# Patient Record
Sex: Male | Born: 1950
Health system: Southern US, Community
[De-identification: ages and names within clinical notes are randomized; demographics above are authoritative.]

## PROBLEM LIST (undated history)

## (undated) DIAGNOSIS — I340 Nonrheumatic mitral (valve) insufficiency: Secondary | ICD-10-CM

## (undated) DIAGNOSIS — I10 Essential (primary) hypertension: Secondary | ICD-10-CM

## (undated) DIAGNOSIS — I5022 Chronic systolic (congestive) heart failure: Secondary | ICD-10-CM

## (undated) DIAGNOSIS — I272 Pulmonary hypertension, unspecified: Secondary | ICD-10-CM

## (undated) DIAGNOSIS — N183 Chronic kidney disease, stage 3 unspecified: Secondary | ICD-10-CM

## (undated) DIAGNOSIS — I513 Intracardiac thrombosis, not elsewhere classified: Secondary | ICD-10-CM

## (undated) DIAGNOSIS — E871 Hypo-osmolality and hyponatremia: Secondary | ICD-10-CM

## (undated) DIAGNOSIS — Z720 Tobacco use: Secondary | ICD-10-CM

## (undated) DIAGNOSIS — K449 Diaphragmatic hernia without obstruction or gangrene: Secondary | ICD-10-CM

## (undated) DIAGNOSIS — F209 Schizophrenia, unspecified: Secondary | ICD-10-CM

## (undated) DIAGNOSIS — E785 Hyperlipidemia, unspecified: Secondary | ICD-10-CM

## (undated) DIAGNOSIS — M1990 Unspecified osteoarthritis, unspecified site: Secondary | ICD-10-CM

## (undated) DIAGNOSIS — I509 Heart failure, unspecified: Secondary | ICD-10-CM

## (undated) DIAGNOSIS — J449 Chronic obstructive pulmonary disease, unspecified: Secondary | ICD-10-CM

## (undated) HISTORY — PX: PENILE PROSTHESIS IMPLANT: SHX240

---

## 2002-03-22 ENCOUNTER — Encounter: Payer: Self-pay | Admitting: Emergency Medicine

## 2002-03-22 ENCOUNTER — Emergency Department (HOSPITAL_COMMUNITY): Admission: EM | Admit: 2002-03-22 | Discharge: 2002-03-22 | Payer: Self-pay | Admitting: Emergency Medicine

## 2003-02-04 ENCOUNTER — Emergency Department (HOSPITAL_COMMUNITY): Admission: EM | Admit: 2003-02-04 | Discharge: 2003-02-04 | Payer: Self-pay | Admitting: Emergency Medicine

## 2003-12-11 ENCOUNTER — Observation Stay (HOSPITAL_COMMUNITY): Admission: RE | Admit: 2003-12-11 | Discharge: 2003-12-12 | Payer: Self-pay | Admitting: Urology

## 2005-07-06 ENCOUNTER — Emergency Department (HOSPITAL_COMMUNITY): Admission: EM | Admit: 2005-07-06 | Discharge: 2005-07-06 | Payer: Self-pay | Admitting: Emergency Medicine

## 2005-08-24 ENCOUNTER — Emergency Department (HOSPITAL_COMMUNITY): Admission: EM | Admit: 2005-08-24 | Discharge: 2005-08-24 | Payer: Self-pay | Admitting: Emergency Medicine

## 2006-01-06 ENCOUNTER — Emergency Department (HOSPITAL_COMMUNITY): Admission: EM | Admit: 2006-01-06 | Discharge: 2006-01-06 | Payer: Self-pay | Admitting: Emergency Medicine

## 2006-09-03 ENCOUNTER — Emergency Department (HOSPITAL_COMMUNITY): Admission: EM | Admit: 2006-09-03 | Discharge: 2006-09-03 | Payer: Self-pay | Admitting: Emergency Medicine

## 2009-12-05 ENCOUNTER — Emergency Department (HOSPITAL_COMMUNITY): Admission: EM | Admit: 2009-12-05 | Discharge: 2009-12-05 | Payer: Self-pay | Admitting: Emergency Medicine

## 2010-10-13 ENCOUNTER — Emergency Department (HOSPITAL_COMMUNITY)
Admission: EM | Admit: 2010-10-13 | Discharge: 2010-10-13 | Disposition: A | Discharge status: Home / Self Care | Payer: Medicare Other | Attending: Emergency Medicine | Admitting: Emergency Medicine

## 2010-10-13 DIAGNOSIS — I10 Essential (primary) hypertension: Secondary | ICD-10-CM | POA: Insufficient documentation

## 2010-10-13 DIAGNOSIS — R109 Unspecified abdominal pain: Secondary | ICD-10-CM | POA: Insufficient documentation

## 2010-10-13 DIAGNOSIS — R112 Nausea with vomiting, unspecified: Secondary | ICD-10-CM | POA: Insufficient documentation

## 2010-10-13 LAB — COMPREHENSIVE METABOLIC PANEL
BUN: 20 mg/dL (ref 6–23)
CO2: 23 mEq/L (ref 19–32)
Chloride: 98 mEq/L (ref 96–112)
Creatinine, Ser: 1.27 mg/dL (ref 0.4–1.5)
GFR calc non Af Amer: 58 mL/min — ABNORMAL LOW (ref >60)
Total Bilirubin: 1.3 mg/dL — ABNORMAL HIGH (ref 0.3–1.2)

## 2010-10-13 LAB — POCT CARDIAC MARKERS: Myoglobin, poc: >500 ng/mL (ref 12–200)

## 2010-11-20 NOTE — Op Note (Signed)
NAME:  Gerald Nelson, Gerald Nelson                          ACCOUNT NO.:  [id number]   MEDICAL RECORD NO.:  [id number]                   PATIENT TYPE:  AMB   LOCATION:  DAY                                  FACILITY:  [facility]   PHYSICIAN:  Lucina Mellow. Terance Hart, M.D.             DATE OF BIRTH:  [birth year]   DATE OF PROCEDURE:  12/11/2003  DATE OF DISCHARGE:                                 OPERATIVE REPORT   PREOPERATIVE DIAGNOSIS:  Failed penile prosthesis.   POSTOPERATIVE DIAGNOSIS:  Failed penile prosthesis.   OPERATION/PROCEDURE:  Removal and replacement of a three-piece penile  prosthesis.   SURGEON:  Lucina Mellow. Terance Hart, M.D.   RESIDENT SURGEON:  Dorie Rank, M.D.   ANESTHESIA:  General endotracheal anesthesia.   COMPLICATIONS:  None.   ESTIMATED BLOOD LOSS:  Less than 100 mL.   INDICATIONS FOR PROCEDURE:  Gerald Nelson is a 60 year old African American  male who was sent to the urology center for evaluation by [name]  secondary to impotency.  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  The patient reports that his prosthesis  worked for some time but has become nonfunctional over the past few years.  Gerald Nelson has requested removal and replacement of his prosthesis and is  for this procedure that he presents today.   DESCRIPTION OF PROCEDURE:  The patient was brought to the operating room and  following induction of general endotracheal anesthesia, was placed in a  supine position and his inguinal region and genitals shaved as per routine.  A 10-minute scrub was performed to thoroughly cleanse the scrotum, penis and  surrounding inguinal region.  The patient was subsequently prepped and  draped in the usual sterile fashion.  A 16-French Foley catheter was placed  to straight drain and plugged.   A midline scrotal incision was subsequently performed.  Dissection  was  carried down through the dartos muscle and the investing tissues of the  corporeal bodies until the surface of the corporeal body had been reached.  A Bovie cautery was subsequently used to open the left corporeal body  longitudinally. The left corporeal cylinder of the old prosthesis was  identified and removed.  Likewise, dissection was carried down to the  surface of the right corporeal body which was subsequently opened  longitudinally with Bovie cautery.  The right corporeal prosthesis  identified and removed.  The scrotal pump was subsequently approached by  dissecting through the surrounding tissue layers and opening the fibrous  capsule around the pump itself.  Once the pump had been removed, attempt was  made to remove the reservoir which was located in the midline just superior  and posterior to the pubic symphysis.  Due to the fact that the entire  reservoir could not be removed without making a second  incision, the  decision was made to simply cut the tubing and leave the old reservoir in  place.  This was subsequently performed and the entire old prosthesis was  removed from the field.  A tunnel was subsequently created from the upper  field up through the subcutaneous tissues and superficial fascia to a point  just superior to the right pubic tubercle.  A space was subsequently created  by making a small opening in the fascia and bluntly creating a space just  posterior to the pubic bone.  A new reservoir was subsequently placed into  this space.   Attention was next focused on the corpora themselves.  After dilating each  corpora up to approximately size 14, measurement was taken which indicated a  length of approximately 19 cm.  An 18 prosthesis was subsequently chosen and  1 cm extenders applied to the posterior end of each cylinder.  Each cylinder  was subsequently placed distally by passing the cylinder through each  corporeal body to the glans penis.  An attempt  was subsequently made to  place the proximal portion of each cylinder once the cylinders had been  placed and the prosthesis was inflated.  However, there is a significant  amount of kinking and buckling of the cylinders.  At this point the 1 cm  extenders were removed and 0.5 cm extenders were applied.  Again, once the  cylinders were in proper position, there was still a small amount of  buckling, although it was much improved.  The cylinders were subsequently  removed and all extenders taken away from the proximal end of the cylinders.  These were subsequently placed into position and on inflation of the  prosthesis, appeared to fit perfectly.  The corpora were subsequently closed  over the new prosthesis using interrupted 2-0 Vicryl suture.  Once the  corporeal closure was complete, the pump was placed into a newly created  pocket in the subcutaneous tissues of the scrotum located in the right  inferior scrotum.  The pump was secured into place by suturing the dartos  muscle over the pump and around the tubing.  The tubing connecting the three  parts of the prosthesis together subsequently joined together and the  prosthesis inflated a final time to assure proper positioning.  The penis  appeared to be in good position and appropriate for function.  The  prosthesis was subsequently deflated and the dartos muscle closed over the  tubing using a running 3-0 chromic suture.  The skin was subsequently closed  with a running 3-0 chromic suture.  Collodion was applied to the suture  line, the penis dressed and the case was ended.  The patient tolerated the  procedure well.  There were no complications.   Please note that Dr. Hessie Diener was present for the entire case and  participated in all aspects of the procedure.     Lynford Citizen, MD                            Lucina Mellow. Terance Hart, M.D.   EG/MEDQ  D:  12/11/2003  T:  12/11/2003  Job:  [id number]

## 2011-07-10 ENCOUNTER — Emergency Department (HOSPITAL_COMMUNITY)
Admission: EM | Admit: 2011-07-10 | Discharge: 2011-07-10 | Disposition: A | Discharge status: Nursing Home (Includes ICF) | Payer: Medicare Other | Source: Home / Self Care | Attending: Emergency Medicine | Admitting: Emergency Medicine

## 2011-07-10 ENCOUNTER — Encounter: Payer: Self-pay | Admitting: *Deleted

## 2011-07-10 ENCOUNTER — Emergency Department (HOSPITAL_COMMUNITY): Payer: Medicare Other

## 2011-07-10 ENCOUNTER — Emergency Department (HOSPITAL_COMMUNITY)
Admission: EM | Admit: 2011-07-10 | Discharge: 2011-07-10 | Disposition: A | Discharge status: Home / Self Care | Payer: Medicare Other | Attending: Emergency Medicine | Admitting: Emergency Medicine

## 2011-07-10 ENCOUNTER — Encounter (HOSPITAL_COMMUNITY): Payer: Self-pay | Admitting: *Deleted

## 2011-07-10 DIAGNOSIS — F17200 Nicotine dependence, unspecified, uncomplicated: Secondary | ICD-10-CM | POA: Insufficient documentation

## 2011-07-10 DIAGNOSIS — R109 Unspecified abdominal pain: Secondary | ICD-10-CM | POA: Insufficient documentation

## 2011-07-10 DIAGNOSIS — I10 Essential (primary) hypertension: Secondary | ICD-10-CM | POA: Insufficient documentation

## 2011-07-10 DIAGNOSIS — R112 Nausea with vomiting, unspecified: Secondary | ICD-10-CM | POA: Insufficient documentation

## 2011-07-10 DIAGNOSIS — K819 Cholecystitis, unspecified: Secondary | ICD-10-CM

## 2011-07-10 DIAGNOSIS — R1011 Right upper quadrant pain: Secondary | ICD-10-CM | POA: Insufficient documentation

## 2011-07-10 HISTORY — DX: Essential (primary) hypertension: I10

## 2011-07-10 LAB — COMPREHENSIVE METABOLIC PANEL
Albumin: 3.2 g/dL — ABNORMAL LOW (ref 3.5–5.2)
BUN: 21 mg/dL (ref 6–23)
Creatinine, Ser: 1.15 mg/dL (ref 0.50–1.35)
Potassium: 3.5 mEq/L (ref 3.5–5.1)
Total Protein: 8.2 g/dL (ref 6.0–8.3)

## 2011-07-10 LAB — POCT URINALYSIS DIP (DEVICE)
Protein, ur: >=300 mg/dL — AB
Specific Gravity, Urine: 1.02 (ref 1.005–1.030)
Urobilinogen, UA: 1 mg/dL (ref 0.0–1.0)

## 2011-07-10 LAB — POCT I-STAT, CHEM 8
Creatinine, Ser: 1.2 mg/dL (ref 0.50–1.35)
Hemoglobin: 18.7 g/dL — ABNORMAL HIGH (ref 13.0–17.0)
Sodium: 137 mEq/L (ref 135–145)
TCO2: 26 mmol/L (ref 0–100)

## 2011-07-10 LAB — LIPASE, BLOOD: Lipase: 32 U/L (ref 11–59)

## 2011-07-10 MED ORDER — SODIUM CHLORIDE 0.9 % IV BOLUS (SEPSIS)
2000.0000 mL | Freq: Once | INTRAVENOUS | Status: AC
  Administered 2011-07-10: 1000 mL via INTRAVENOUS

## 2011-07-10 MED ORDER — SODIUM CHLORIDE 0.9 % IV SOLN: INTRAVENOUS | Status: DC

## 2011-07-10 MED ORDER — PROMETHAZINE HCL 25 MG PO TABS: 25.0000 mg | ORAL_TABLET | Freq: Four times a day (QID) | ORAL | Status: AC | PRN

## 2011-07-10 MED ORDER — METOCLOPRAMIDE HCL 5 MG/ML IJ SOLN
10.0000 mg | Freq: Once | INTRAMUSCULAR | Status: AC
  Administered 2011-07-10: 10 mg via INTRAVENOUS
  Filled 2011-07-10: qty 2

## 2011-07-10 MED ORDER — HYDROCODONE-ACETAMINOPHEN 5-325 MG PO TABS: 1.0000 | ORAL_TABLET | Freq: Four times a day (QID) | ORAL | Status: AC | PRN

## 2011-07-10 MED ORDER — HYDROMORPHONE HCL PF 1 MG/ML IJ SOLN
1.0000 mg | Freq: Once | INTRAMUSCULAR | Status: AC
  Administered 2011-07-10: 1 mg via INTRAVENOUS
  Filled 2011-07-10: qty 1

## 2011-07-10 NOTE — ED Provider Notes (Signed)
History     CSN: [id number]  Arrival date & time 07/10/11  [id number]   First MD Initiated Contact with Patient 07/10/11 0945      Chief Complaint  Patient presents with  . Nausea  . Emesis  . Back Pain  . Cough  . Abdominal Pain    (Consider location/radiation/quality/duration/timing/severity/associated sxs/prior treatment) HPI Comments: [name] has had a two-day history of mid abdominal pain he rates a 4/10 in intensity. The pain is constant and not affected by food or position. Also since yesterday he's had nausea and vomiting of most by mouth intake. He denies any blood, coffee-ground material, or green or yellow material in the emesis. He states the vomitus just contains digested food. He denies any fever, chills, sweats, aches, malaise, or dizziness. He has had some pain in his bilateral lower back which he rates as 6/10 in intensity. He denies any diarrhea or change in bowel habits. He hasn't had any urinary symptoms. He does have a slight cough. He has a history of high blood pressure and is on 3 different medications for that.  Patient is a 60 y.o. male presenting with vomiting, back pain, cough, and abdominal pain.  Emesis  Associated symptoms include abdominal pain and cough. Pertinent negatives include no chills, no diarrhea and no fever.  Back Pain  Associated symptoms include abdominal pain. Pertinent negatives include no chest pain, no fever and no dysuria.  Cough Pertinent negatives include no chest pain, no chills, no shortness of breath and no wheezing.  Abdominal Pain The primary symptoms of the illness include abdominal pain, nausea and vomiting. The primary symptoms of the illness do not include fever, shortness of breath, diarrhea or dysuria.  Additional symptoms associated with the illness include back pain. Symptoms associated with the illness do not include chills, constipation, urgency or frequency.    Past Medical History  Diagnosis Date  . Hypertension      Past Surgical History  Procedure Date  . Penile prosthesis implant     History reviewed. No pertinent family history.  History  Substance Use Topics  . Smoking status: Current Everyday Smoker  . Smokeless tobacco: Not on file  . Alcohol Use: No      Review of Systems  Constitutional: Negative for fever, chills, appetite change and unexpected weight change.  Respiratory: Positive for cough. Negative for shortness of breath and wheezing.   Cardiovascular: Negative for chest pain.  Gastrointestinal: Positive for nausea, vomiting and abdominal pain. Negative for diarrhea, constipation, blood in stool, abdominal distention, anal bleeding and rectal pain.  Genitourinary: Negative for dysuria, urgency and frequency.  Musculoskeletal: Positive for back pain.  Skin: Negative for rash.    Allergies  Review of patient's allergies indicates no known allergies.  Home Medications   Current Outpatient Rx  Name Route Sig Dispense Refill  . CLONIDINE HCL 0.2 MG PO TABS Oral Take 0.2 mg by mouth 2 (two) times daily.      Marland Kitchen DILTIAZEM HCL ER 240 MG PO CP24 Oral Take 240 mg by mouth daily.      Marland Kitchen HALOPERIDOL 1 MG PO TABS Oral Take 2 mg by mouth 1 day or 1 dose.      Marland Kitchen LISINOPRIL-HYDROCHLOROTHIAZIDE 20-12.5 MG PO TABS Oral Take 1 tablet by mouth daily.        BP 153/102  Pulse 105  Temp(Src) 98.9 F (37.2 C) (Oral)  Resp 18  SpO2 96%  Physical Exam  Nursing note and vitals reviewed. Constitutional:  He appears well-developed and well-nourished. No distress.  Eyes: No scleral icterus.  Cardiovascular: Normal rate, regular rhythm and normal heart sounds.  Exam reveals no gallop and no friction rub.   No murmur heard. Pulmonary/Chest: Effort normal and breath sounds normal. No respiratory distress. He has no wheezes. He has no rales.  Abdominal: Soft. Bowel sounds are normal. He exhibits no distension and no mass. There is no hepatosplenomegaly. There is tenderness (there is  tenderness to palpation over the epigastrium and umbilical area, but most markedly in the right upper cord at with a positive Murphy's sign Murphy's punch. The abdomen was soft and flat. Bowel sounds were hyperactive). There is no rebound, no guarding and no CVA tenderness.  Skin: Skin is warm and dry. No rash noted. He is not diaphoretic.    ED Course  Procedures (including critical care time)  Results for orders placed during the hospital encounter of 07/10/11  POCT URINALYSIS DIP (DEVICE)      Component Value Range   Glucose, UA NEGATIVE  NEGATIVE (mg/dL)   Bilirubin Urine SMALL (*) NEGATIVE    Ketones, ur NEGATIVE  NEGATIVE (mg/dL)   Specific Gravity, Urine 1.020  1.005 - 1.030    Hgb urine dipstick MODERATE (*) NEGATIVE    pH 5.5  5.0 - 8.0    Protein, ur >=300 (*) NEGATIVE (mg/dL)   Urobilinogen, UA 1.0  0.0 - 1.0 (mg/dL)   Nitrite NEGATIVE  NEGATIVE    Leukocytes, UA NEGATIVE  NEGATIVE   POCT I-STAT, CHEM 8      Component Value Range   Sodium 137  135 - 145 (mEq/L)   Potassium 3.6  3.5 - 5.1 (mEq/L)   Chloride 101  96 - 112 (mEq/L)   BUN 20  6 - 23 (mg/dL)   Creatinine, Ser 1.20  0.50 - 1.35 (mg/dL)   Glucose, Bld 113 (*) 70 - 99 (mg/dL)   Calcium, Ion 1.23  1.12 - 1.32 (mmol/L)   TCO2 26  0 - 100 (mmol/L)   Hemoglobin 18.7 (*) 13.0 - 17.0 (g/dL)   HCT 55.0 (*) 39.0 - 52.0 (%)     Labs Reviewed  POCT URINALYSIS DIP (DEVICE) - Abnormal; Notable for the following:    Bilirubin Urine SMALL (*)    Hgb urine dipstick MODERATE (*)    Protein, ur >=300 (*)    All other components within normal limits  POCT I-STAT, CHEM 8 - Abnormal; Notable for the following:    Glucose, Bld 113 (*)    Hemoglobin 18.7 (*)    HCT 55.0 (*)    All other components within normal limits  POCT URINALYSIS DIPSTICK  I-STAT, CHEM 8   No results found.   1. Cholecystitis       MDM  He has a two-day history of abdominal pain, nausea, and vomiting. His family also states she's been  losing weight. He also has had a more severe lower back pain. On examination he is tender in his right upper quadrant with a positive Murphy sign Murphy's punch. I'm concerned about acute cholecystitis and am transferring him to the emergency department via shuttle.        Birdena Crandall, MD 07/10/11 1050

## 2011-07-10 NOTE — ED Provider Notes (Signed)
Medical screening examination/treatment/procedure(s) were conducted as a shared visit with non-physician practitioner(s) and myself.  I personally evaluated the patient during the encounter  Babette Relic, MD 07/10/11 [phone]

## 2011-07-10 NOTE — ED Provider Notes (Signed)
4:49 PM Patient presents to the emergency room with complaint of abdominal pain and nausea. Patient has no nausea or vomiting. Passed po fluid challenge. Patient has no abdominal pain at this time. Resting comfortably. NO abnormalities on [place]. Denies any chest pain or SOB.   Patient seen and re-evaluated. Resting comfortably. VSS stable. NAD. Patient notified of testing results. Stated agreement and understanding. Patient stated understanding to treatment plan and diagnosis. Advised patient of warning signs to return.  Results for orders placed during the hospital encounter of 07/10/11  COMPREHENSIVE METABOLIC PANEL      Component Value Range   Sodium 135  135 - 145 (mEq/L)   Potassium 3.5  3.5 - 5.1 (mEq/L)   Chloride 98  96 - 112 (mEq/L)   CO2 24  19 - 32 (mEq/L)   Glucose, Bld 106 (*) 70 - 99 (mg/dL)   BUN 21  6 - 23 (mg/dL)   Creatinine, Ser 1.15  0.50 - 1.35 (mg/dL)   Calcium 10.1  8.4 - 10.5 (mg/dL)   Total Protein 8.2  6.0 - 8.3 (g/dL)   Albumin 3.2 (*) 3.5 - 5.2 (g/dL)   AST 41 (*) 0 - 37 (U/L)   ALT 19  0 - 53 (U/L)   Alkaline Phosphatase 67  39 - 117 (U/L)   Total Bilirubin 1.0  0.3 - 1.2 (mg/dL)   GFR calc non Af Amer 68 (*) >90 (mL/min)   GFR calc Af Amer 79 (*) >90 (mL/min)  LIPASE, BLOOD      Component Value Range   Lipase 32  11 - 59 (U/L)   Dg Chest 2 View  07/10/2011  *RADIOLOGY REPORT*  Clinical Data: Right upper quadrant pain, chest pain, smoker  CHEST - 2 VIEW  Comparison:  04/13/2010  Findings:  The heart size and mediastinal contours are within normal limits.  Both lungs are clear.  The visualized skeletal structures are unremarkable. Thoracic spondylosis and degenerative changes noted.  IMPRESSION: No active cardiopulmonary disease.  Original Report Authenticated By: Jerilynn Mages. Daryll Brod, M.D.   US Abdomen Complete  07/10/2011  *RADIOLOGY REPORT*  Clinical Data:  Right upper quadrant abdominal pain  COMPLETE ABDOMINAL ULTRASOUND  Comparison:  None.  Findings:   Gallbladder:  No gallstones, gallbladder wall thickening, or pericholecystic fluid.  Negative sonographic Murphy's sign.  Common bile duct:  Measures 5 mm.  Liver:  No focal lesion identified.  Within normal limits in parenchymal echogenicity.  IVC:  Appears normal.  Pancreas:  Incompletely visualized but grossly unremarkable.  Spleen:  Measures 9.0 cm.  Right Kidney:  Measures 10.4 cm.  No mass or hydronephrosis.  Left Kidney:  Poorly visualized.  Measures 10.0 cm.  No mass or hydronephrosis.  Abdominal aorta:  No aneurysm identified.  IMPRESSION: Negative abdominal ultrasound.  Original Report Authenticated By: Julian Hy, M.D.      Benson Setting, PA 07/10/11 [phone]

## 2011-07-10 NOTE — ED Notes (Signed)
Pt with onset of nausea/vomiting/low back pain/cough yesterday - denies nausea or vomiting today - here due to continued low back pain / abdominal pain -

## 2011-07-10 NOTE — ED Notes (Signed)
Pt unable to give urine sample.  Patient given cup of water

## 2011-07-10 NOTE — ED Notes (Signed)
Pt sent here from u[facility] for further eval of abd pain, back pain and n/v x 2 days.

## 2011-07-10 NOTE — ED Provider Notes (Signed)
History     CSN: [id number]  Arrival date & time 07/10/11  1101   First MD Initiated Contact with Patient 07/10/11 1335      Chief Complaint  Patient presents with  . Abdominal Pain  . Emesis    (Consider location/radiation/quality/duration/timing/severity/associated sxs/prior treatment) HPI This 61 year old male is gradual onset of the last 2-3 days of right upper quadrant abdominal pain radiating to his right flank with nausea and vomiting which is nonbloody several times, he had a normal bowel movement a couple days ago, he is no cough shortness of breath or exertional pain, he has no chest pain. There's been no fever or trauma. He has no difficulty urinating. He has no testicular pain. He has no lower abdominal pain. There's been no treatment prior to arrival other than a negative urinalysis at the urgent care just prior to arrival to the ED today.  Past Medical History  Diagnosis Date  . Hypertension     Past Surgical History  Procedure Date  . Penile prosthesis implant     History reviewed. No pertinent family history.  History  Substance Use Topics  . Smoking status: Current Everyday Smoker  . Smokeless tobacco: Not on file  . Alcohol Use: No      Review of Systems  Constitutional: Negative for fever.       10 Systems reviewed and are negative for acute change except as noted in the HPI.  HENT: Negative for congestion.   Eyes: Negative for discharge and redness.  Respiratory: Negative for cough and shortness of breath.   Cardiovascular: Negative for chest pain.  Gastrointestinal: Positive for nausea, vomiting and abdominal pain. Negative for diarrhea.  Genitourinary: Negative for dysuria and hematuria.  Musculoskeletal: Negative for back pain.  Skin: Negative for rash.  Neurological: Negative for syncope, numbness and headaches.  Psychiatric/Behavioral:       No behavior change.    Allergies  Review of patient's allergies indicates no known  allergies.  Home Medications   Current Outpatient Rx  Name Route Sig Dispense Refill  . CLONIDINE HCL 0.2 MG PO TABS Oral Take 0.2 mg by mouth 2 (two) times daily.      Marland Kitchen DILTIAZEM HCL ER 240 MG PO CP24 Oral Take 240 mg by mouth daily.      Marland Kitchen HALOPERIDOL 1 MG PO TABS Oral Take 2 mg by mouth 1 day or 1 dose.      Marland Kitchen LISINOPRIL-HYDROCHLOROTHIAZIDE 20-12.5 MG PO TABS Oral Take 1 tablet by mouth daily.      Marland Kitchen HYDROCODONE-ACETAMINOPHEN 5-325 MG PO TABS Oral Take 1 tablet by mouth every 6 (six) hours as needed for pain. 6 tablet 0  . PROMETHAZINE HCL 25 MG PO TABS Oral Take 1 tablet (25 mg total) by mouth every 6 (six) hours as needed for nausea. 12 tablet 0    BP 114/73  Pulse 82  Temp(Src) 98.3 F (36.8 C) (Oral)  Resp 16  SpO2 99%  Physical Exam  Nursing note and vitals reviewed. Constitutional:       Awake, alert, nontoxic appearance.  HENT:  Head: Atraumatic.  Mouth/Throat: No oropharyngeal exudate.  Eyes: Right eye exhibits no discharge. Left eye exhibits no discharge.  Neck: Neck supple.  Cardiovascular: Normal rate and regular rhythm.   No murmur heard. Pulmonary/Chest: Effort normal and breath sounds normal. No respiratory distress. He has no wheezes. He has no rales. He exhibits no tenderness.  Abdominal: Soft. Bowel sounds are normal. He exhibits no mass. There  is tenderness. There is guarding. There is no rebound.       Mild to moderate right upper quadrant pain and tenderness only with no peritoneal signs and nontender to the rest of the abdomen with no CVA tenderness or back tenderness  Musculoskeletal: He exhibits no edema and no tenderness.       Baseline ROM, no obvious new focal weakness.  Neurological:       Mental status and motor strength appears baseline for patient and situation.  Skin: No rash noted.  Psychiatric: He has a normal mood and affect.    ED Course  Procedures (including critical care time) The patient will move to the clinical decision unit as  a shared visit with the physician's assistant. Labs Reviewed  COMPREHENSIVE METABOLIC PANEL - Abnormal; Notable for the following:    Glucose, Bld 106 (*)    Albumin 3.2 (*)    AST 41 (*)    GFR calc non Af Amer 68 (*)    GFR calc Af Amer 79 (*)    All other components within normal limits  LIPASE, BLOOD   Dg Chest 2 View  07/10/2011  *RADIOLOGY REPORT*  Clinical Data: Right upper quadrant pain, chest pain, smoker  CHEST - 2 VIEW  Comparison:  04/13/2010  Findings:  The heart size and mediastinal contours are within normal limits.  Both lungs are clear.  The visualized skeletal structures are unremarkable. Thoracic spondylosis and degenerative changes noted.  IMPRESSION: No active cardiopulmonary disease.  Original Report Authenticated By: Jerilynn Mages. Daryll Brod, M.D.   US Abdomen Complete  07/10/2011  *RADIOLOGY REPORT*  Clinical Data:  Right upper quadrant abdominal pain  COMPLETE ABDOMINAL ULTRASOUND  Comparison:  None.  Findings:  Gallbladder:  No gallstones, gallbladder wall thickening, or pericholecystic fluid.  Negative sonographic Murphy's sign.  Common bile duct:  Measures 5 mm.  Liver:  No focal lesion identified.  Within normal limits in parenchymal echogenicity.  IVC:  Appears normal.  Pancreas:  Incompletely visualized but grossly unremarkable.  Spleen:  Measures 9.0 cm.  Right Kidney:  Measures 10.4 cm.  No mass or hydronephrosis.  Left Kidney:  Poorly visualized.  Measures 10.0 cm.  No mass or hydronephrosis.  Abdominal aorta:  No aneurysm identified.  IMPRESSION: Negative abdominal ultrasound.  Original Report Authenticated By: Julian Hy, M.D.     1. Abdominal pain       MDM  Medical screening examination/treatment/procedure(s) were conducted as a shared visit with non-physician practitioner(s) and myself.  I personally evaluated the patient during the encounter        Babette Relic, MD 07/10/11 [phone]

## 2011-07-10 NOTE — ED Notes (Signed)
This RN with one IV attempt, will have another RN look when he returns from [place]

## 2012-06-06 ENCOUNTER — Encounter (HOSPITAL_COMMUNITY): Payer: Self-pay | Admitting: Emergency Medicine

## 2012-06-06 ENCOUNTER — Emergency Department (HOSPITAL_COMMUNITY)
Admission: EM | Admit: 2012-06-06 | Discharge: 2012-06-06 | Discharge status: Against Medical Advice | Payer: Medicare Other | Attending: Emergency Medicine | Admitting: Emergency Medicine

## 2012-06-06 DIAGNOSIS — Z8659 Personal history of other mental and behavioral disorders: Secondary | ICD-10-CM | POA: Insufficient documentation

## 2012-06-06 DIAGNOSIS — R1013 Epigastric pain: Secondary | ICD-10-CM | POA: Insufficient documentation

## 2012-06-06 DIAGNOSIS — R05 Cough: Secondary | ICD-10-CM | POA: Insufficient documentation

## 2012-06-06 DIAGNOSIS — R52 Pain, unspecified: Secondary | ICD-10-CM

## 2012-06-06 DIAGNOSIS — R1011 Right upper quadrant pain: Secondary | ICD-10-CM | POA: Insufficient documentation

## 2012-06-06 DIAGNOSIS — R059 Cough, unspecified: Secondary | ICD-10-CM | POA: Insufficient documentation

## 2012-06-06 DIAGNOSIS — Z7982 Long term (current) use of aspirin: Secondary | ICD-10-CM | POA: Insufficient documentation

## 2012-06-06 DIAGNOSIS — I10 Essential (primary) hypertension: Secondary | ICD-10-CM | POA: Insufficient documentation

## 2012-06-06 DIAGNOSIS — F17200 Nicotine dependence, unspecified, uncomplicated: Secondary | ICD-10-CM | POA: Insufficient documentation

## 2012-06-06 DIAGNOSIS — R109 Unspecified abdominal pain: Secondary | ICD-10-CM

## 2012-06-06 DIAGNOSIS — Z79899 Other long term (current) drug therapy: Secondary | ICD-10-CM | POA: Insufficient documentation

## 2012-06-06 DIAGNOSIS — R112 Nausea with vomiting, unspecified: Secondary | ICD-10-CM | POA: Insufficient documentation

## 2012-06-06 NOTE — ED Provider Notes (Signed)
History     CSN: [id number]  Arrival date & time 06/06/12  1419   First MD Initiated Contact with Patient 06/06/12 1545      Chief Complaint  Patient presents with  . flu like symptoms     (Consider location/radiation/quality/duration/timing/severity/associated sxs/prior treatment) HPI Comments: Patient states he has body aches, abdominal pain, N/V that began last night.  States the course is gradually improving.  Emesis was contents of his stomach.  Denies CP, SOB, HA, neck pain, abdominal bloating.  Last BM was 5 hours ago and was normal, nonbloody.    The history is provided by the patient.    Past Medical History  Diagnosis Date  . Hypertension   . Psychiatric diagnosis     Pt did not know what it is    Past Surgical History  Procedure Date  . Penile prosthesis implant     History reviewed. No pertinent family history.  History  Substance Use Topics  . Smoking status: Current Every Day Smoker  . Smokeless tobacco: Not on file  . Alcohol Use: No      Review of Systems  HENT: Negative for sore throat.   Respiratory: Positive for cough. Negative for shortness of breath.   Cardiovascular: Negative for chest pain.  Gastrointestinal: Positive for nausea, vomiting and abdominal pain. Negative for diarrhea, constipation and blood in stool.  Musculoskeletal: Positive for myalgias.    Allergies  Sulfa antibiotics  Home Medications   Current Outpatient Rx  Name  Route  Sig  Dispense  Refill  . ASPIRIN 325 MG PO TABS   Oral   Take 325 mg by mouth daily.         Marland Kitchen CLONIDINE HCL 0.2 MG PO TABS   Oral   Take 0.2 mg by mouth 2 (two) times daily.          Marland Kitchen DILTIAZEM HCL ER 240 MG PO CP24   Oral   Take 240 mg by mouth daily.           Marland Kitchen HALOPERIDOL 1 MG PO TABS   Oral   Take 2 mg by mouth 1 day or 1 dose.           Marland Kitchen LISINOPRIL-HYDROCHLOROTHIAZIDE 20-12.5 MG PO TABS   Oral   Take 1 tablet by mouth daily.             BP 177/104  Pulse 99   Temp 98.8 F (37.1 C) (Oral)  SpO2 100%  Physical Exam  Nursing note and vitals reviewed. Constitutional: He appears well-developed and well-nourished. No distress.  HENT:  Head: Normocephalic and atraumatic.  Neck: Neck supple.  Pulmonary/Chest: Effort normal.  Abdominal: Soft. There is tenderness in the right upper quadrant and epigastric area.  Neurological: He is alert.  Skin: He is not diaphoretic.    ED Course  Procedures (including critical care time)  Labs Reviewed - No data to display No results found.  4:24 PM Pt initially placed in Fast Track and was screened by me.  Patient has abdominal pain with tenderness in his upper abdomen.  Also with elevated blood pressure.  I recommended that patient be moved to the back for further workup as his presenting symptoms are not as easily addressed in or appropriate for fast track.  Pt declined, stating he would prefer to follow up with his PCP tomorrow and declined to stay any longer in the ED.  I advised him several times to stay but pt declines.  1. Abdominal pain   2. Nausea and vomiting   3. Body aches   4. Hypertension      MDM  Pt left AMA after he was seen and screened by me.  I recommended that he stay for testing given his symptoms, but he states he prefers to see his physician Dr Deforest Hoyles tomorrow.         [place], [place] 06/06/12 1627

## 2012-06-06 NOTE — ED Notes (Signed)
Pt states that since last night he has had body aches, nausea, and cough. Afebrile. Hypertensive. States he has hypertension but hasn't taken meds in several weeks.

## 2012-06-07 ENCOUNTER — Ambulatory Visit
Admission: RE | Admit: 2012-06-07 | Discharge: 2012-06-07 | Disposition: A | Discharge status: Home / Self Care | Payer: Medicare Other | Source: Ambulatory Visit | Attending: Internal Medicine | Admitting: Internal Medicine

## 2012-06-07 ENCOUNTER — Other Ambulatory Visit: Payer: Self-pay | Admitting: Internal Medicine

## 2012-06-07 DIAGNOSIS — R109 Unspecified abdominal pain: Secondary | ICD-10-CM

## 2012-06-09 ENCOUNTER — Other Ambulatory Visit: Payer: Medicare Other

## 2012-06-10 NOTE — ED Provider Notes (Signed)
Medical screening examination/treatment/procedure(s) were performed by non-physician practitioner and as supervising physician I was immediately available for consultation/collaboration.   Virgel Manifold, MD 06/10/12 1950

## 2012-06-12 ENCOUNTER — Other Ambulatory Visit (HOSPITAL_COMMUNITY): Payer: Self-pay | Admitting: Internal Medicine

## 2012-06-12 DIAGNOSIS — J449 Chronic obstructive pulmonary disease, unspecified: Secondary | ICD-10-CM

## 2012-06-15 ENCOUNTER — Ambulatory Visit (HOSPITAL_COMMUNITY)
Admission: RE | Admit: 2012-06-15 | Discharge: 2012-06-15 | Disposition: A | Discharge status: Home / Self Care | Payer: Medicare Other | Source: Ambulatory Visit | Attending: Internal Medicine | Admitting: Internal Medicine

## 2012-06-15 DIAGNOSIS — J449 Chronic obstructive pulmonary disease, unspecified: Secondary | ICD-10-CM | POA: Insufficient documentation

## 2012-06-15 DIAGNOSIS — J4489 Other specified chronic obstructive pulmonary disease: Secondary | ICD-10-CM | POA: Insufficient documentation

## 2012-06-15 MED ORDER — ALBUTEROL SULFATE (5 MG/ML) 0.5% IN NEBU
2.5000 mg | INHALATION_SOLUTION | Freq: Once | RESPIRATORY_TRACT | Status: AC
  Administered 2012-06-15: 2.5 mg via RESPIRATORY_TRACT

## 2012-10-10 ENCOUNTER — Emergency Department (HOSPITAL_COMMUNITY): Payer: Medicare Other

## 2012-10-10 ENCOUNTER — Encounter (HOSPITAL_COMMUNITY): Payer: Self-pay | Admitting: Emergency Medicine

## 2012-10-10 ENCOUNTER — Emergency Department (HOSPITAL_COMMUNITY)
Admission: EM | Admit: 2012-10-10 | Discharge: 2012-10-10 | Disposition: A | Discharge status: Home / Self Care | Payer: Medicare Other | Attending: Emergency Medicine | Admitting: Emergency Medicine

## 2012-10-10 DIAGNOSIS — F17200 Nicotine dependence, unspecified, uncomplicated: Secondary | ICD-10-CM | POA: Insufficient documentation

## 2012-10-10 DIAGNOSIS — R1110 Vomiting, unspecified: Secondary | ICD-10-CM | POA: Insufficient documentation

## 2012-10-10 DIAGNOSIS — R109 Unspecified abdominal pain: Secondary | ICD-10-CM | POA: Insufficient documentation

## 2012-10-10 DIAGNOSIS — I10 Essential (primary) hypertension: Secondary | ICD-10-CM | POA: Insufficient documentation

## 2012-10-10 DIAGNOSIS — Z79899 Other long term (current) drug therapy: Secondary | ICD-10-CM | POA: Insufficient documentation

## 2012-10-10 DIAGNOSIS — F209 Schizophrenia, unspecified: Secondary | ICD-10-CM | POA: Insufficient documentation

## 2012-10-10 HISTORY — DX: Schizophrenia, unspecified: F20.9

## 2012-10-10 LAB — COMPREHENSIVE METABOLIC PANEL
Alkaline Phosphatase: 68 U/L (ref 39–117)
BUN: 29 mg/dL — ABNORMAL HIGH (ref 6–23)
CO2: 24 mEq/L (ref 19–32)
GFR calc Af Amer: 60 mL/min — ABNORMAL LOW (ref >90)
GFR calc non Af Amer: 52 mL/min — ABNORMAL LOW (ref >90)
Glucose, Bld: 111 mg/dL — ABNORMAL HIGH (ref 70–99)
Potassium: 3.1 mEq/L — ABNORMAL LOW (ref 3.5–5.1)
Total Bilirubin: 0.8 mg/dL (ref 0.3–1.2)
Total Protein: 7.8 g/dL (ref 6.0–8.3)

## 2012-10-10 LAB — LIPASE, BLOOD: Lipase: 30 U/L (ref 11–59)

## 2012-10-10 LAB — URINALYSIS, MICROSCOPIC ONLY
Bilirubin Urine: NEGATIVE
Ketones, ur: NEGATIVE mg/dL
Nitrite: NEGATIVE
Urobilinogen, UA: 1 mg/dL (ref 0.0–1.0)

## 2012-10-10 MED ORDER — ONDANSETRON HCL 4 MG/2ML IJ SOLN
4.0000 mg | Freq: Once | INTRAMUSCULAR | Status: AC
  Administered 2012-10-10: 4 mg via INTRAVENOUS
  Filled 2012-10-10: qty 2

## 2012-10-10 MED ORDER — SODIUM CHLORIDE 0.9 % IV BOLUS (SEPSIS)
1000.0000 mL | Freq: Once | INTRAVENOUS | Status: AC
  Administered 2012-10-10: 1000 mL via INTRAVENOUS

## 2012-10-10 MED ORDER — IOHEXOL 300 MG/ML  SOLN
50.0000 mL | Freq: Once | INTRAMUSCULAR | Status: AC | PRN
  Administered 2012-10-10: 50 mL via ORAL

## 2012-10-10 MED ORDER — ONDANSETRON HCL 4 MG PO TABS: 4.0000 mg | ORAL_TABLET | Freq: Four times a day (QID) | ORAL | Status: DC

## 2012-10-10 MED ORDER — MORPHINE SULFATE 4 MG/ML IJ SOLN
4.0000 mg | Freq: Once | INTRAMUSCULAR | Status: AC
  Administered 2012-10-10: 4 mg via INTRAVENOUS
  Filled 2012-10-10: qty 1

## 2012-10-10 MED ORDER — POLYETHYLENE GLYCOL 3350 17 GM/SCOOP PO POWD: 17.0000 g | Freq: Every day | ORAL | Status: DC

## 2012-10-10 MED ORDER — POTASSIUM CHLORIDE CRYS ER 20 MEQ PO TBCR
40.0000 meq | EXTENDED_RELEASE_TABLET | Freq: Once | ORAL | Status: AC
  Administered 2012-10-10: 40 meq via ORAL
  Filled 2012-10-10: qty 2

## 2012-10-10 MED ORDER — IOHEXOL 300 MG/ML  SOLN
100.0000 mL | Freq: Once | INTRAMUSCULAR | Status: AC | PRN
  Administered 2012-10-10: 100 mL via INTRAVENOUS

## 2012-10-10 NOTE — ED Notes (Signed)
Bed:WA05<BR> Expected date:<BR> Expected time:<BR> Means of arrival:<BR> Comments:<BR>

## 2012-10-10 NOTE — ED Notes (Signed)
Per EMS: Pt states that he has lower abd pain.  Denies NVD.  Family states that he has had NVD x 1 wk.  Family wants him evaluated because he takes too many showers.  Pt has hx of schizophrenia.  Denies SI/HI.

## 2012-10-10 NOTE — ED Notes (Signed)
instructed pt needed urine specimen. Pt states he is unable at this time

## 2012-10-10 NOTE — ED Provider Notes (Signed)
History     CSN: [id number]  Arrival date & time 10/10/12  1027   First MD Initiated Contact with Patient 10/10/12 1056      Chief Complaint  Patient presents with  . Abdominal Pain    (Consider location/radiation/quality/duration/timing/severity/associated sxs/prior treatment) HPI  62 year old male with history of schizophrenia, history of hypertension presents complaining of low abnormal pain. Patient reports for the past 3 days he has had persistent lower bowel pain. Describe pain as a dull sensation, nonradiating, nothing makes it better or worse. He did mention that he has not eat because he is afraid of the pain and did vomit once of food content. His last bowel movement was yesterday and was normal. Otherwise patient denies fever, chills, chest pain, shortness of breath, back pain, dysuria, testicular or scrotal pain and swelling, or rash. He denies hematochezia, or melena. He reports having similar abdominal pain 6 months ago which was relieved after taking "some medication".  Per nursing note, family wants him to be evaluated because he takes too many showers.    Past Medical History  Diagnosis Date  . Hypertension   . Psychiatric diagnosis     Pt did not know what it is  . Schizophrenia     Past Surgical History  Procedure Laterality Date  . Penile prosthesis implant      No family history on file.  History  Substance Use Topics  . Smoking status: Current Every Day Smoker  . Smokeless tobacco: Not on file  . Alcohol Use: No      Review of Systems  Constitutional:       A complete 10 system review of systems was obtained and all systems are negative except as noted in the HPI and PMH.    Allergies  Sulfa antibiotics  Home Medications   Current Outpatient Rx  Name  Route  Sig  Dispense  Refill  . cloNIDine (CATAPRES) 0.2 MG tablet   Oral   Take 0.2 mg by mouth 2 (two) times daily.          Marland Kitchen diltiazem (DILACOR XR) 240 MG 24 hr capsule   Oral    Take 240 mg by mouth daily.           . haloperidol (HALDOL) 1 MG tablet   Oral   Take 2 mg by mouth daily.          Marland Kitchen lisinopril-hydrochlorothiazide (PRINZIDE,ZESTORETIC) 20-12.5 MG per tablet   Oral   Take 1 tablet by mouth daily.             BP 155/95  Pulse 97  Temp(Src) 98.6 F (37 C) (Oral)  Resp 18  SpO2 99%  Physical Exam  Nursing note and vitals reviewed. Constitutional: He is oriented to person, place, and time. He appears well-developed and well-nourished. No distress.  Awake, alert, nontoxic appearance  HENT:  Head: Atraumatic.  Eyes: Conjunctivae are normal. Right eye exhibits no discharge. Left eye exhibits no discharge.  Neck: Normal range of motion. Neck supple.  Cardiovascular: Normal rate and regular rhythm.   Pulmonary/Chest: Effort normal. No respiratory distress. He exhibits no tenderness.  Abdominal: Soft. There is tenderness ( generalized abdominal tenderness on abdominal exam without focal point tenderness. No guarding, no rebound tenderness, no hernia noted. No overlying skin changes noted. Negative Murphy's sign, no McBurney Sporn.). There is no rebound.  Genitourinary:  No CVA tenderness.  No testicle or penile pain on exam. Penile prosthesis implant can be palpated on exam.  No hernia noted.  Normal rectal tone. No evidence of external hemorrhoid. Hemoccult negative.  Musculoskeletal: He exhibits no tenderness.  ROM appears intact, no obvious focal weakness  Neurological: He is alert and oriented to person, place, and time.  Skin: Skin is warm and dry. No rash noted.  Psychiatric: He has a normal mood and affect.    ED Course  Procedures (including critical care time)  11:12 AM Patient reports no abdominal pain. Nonsurgical abdomen on exam. Patient is afebrile with stable normal vital signs. Workup initiated. Pain medication given.  Due to his age, may consider abd/pelvic CT to r/o diverticulitis.  Care discussed with  attending.  2:24 PM Abdominal and pelvis CT shows no acute abnormalities.  There is wall thickening of the urinary bladder which may suggest chronic bladder outlet obstruction. His creatinine is 1.44 today. It was normal a year ago. This may account for his low abnormal pain. He will need to followup outpatient with a urologist for further management. Patient also has large amount of stools in the colon which can also account for his abdominal pain. Stool softener prescribed. Otherwise patient is stable for discharge. Care discussed with attending. Patient also has a potassium level of 3.1. Supplementation given in the ED.  Pt able to tolerates PO.    Labs Reviewed  URINALYSIS, MICROSCOPIC ONLY - Abnormal; Notable for the following:    Protein, ur 30 (*)    All other components within normal limits  CBC WITH DIFFERENTIAL - Abnormal; Notable for the following:    Hemoglobin 17.1 (*)    MCHC 38.1 (*)    Neutrophils Relative 85 (*)    Neutro Abs 8.0 (*)    Lymphocytes Relative 11 (*)    All other components within normal limits  COMPREHENSIVE METABOLIC PANEL - Abnormal; Notable for the following:    Sodium 131 (*)    Potassium 3.1 (*)    Chloride 92 (*)    Glucose, Bld 111 (*)    BUN 29 (*)    Creatinine, Ser 1.44 (*)    AST 64 (*)    GFR calc non Af Amer 52 (*)    GFR calc Af Amer 60 (*)    All other components within normal limits  LIPASE, BLOOD  CBC WITH DIFFERENTIAL  OCCULT BLOOD, POC DEVICE   Ct Abdomen Pelvis W Contrast  10/10/2012  *RADIOLOGY REPORT*  Clinical Data: Abdominal pain.  CT ABDOMEN AND PELVIS WITH CONTRAST  Technique:  Multidetector CT imaging of the abdomen and pelvis was performed following the standard protocol during bolus administration of intravenous contrast.  Contrast: 159mL OMNIPAQUE IOHEXOL 300 MG/ML  SOLN  Comparison: None.  Findings: Lung bases are clear.  No evidence for free intraperitoneal air.  There is a hiatal hernia and cannot exclude wall thickening  of the hiatal hernia or distal esophagus on the first image.  Normal appearance of the liver, gallbladder and portal venous system.  Normal appearance of the spleen, kidneys, adrenal glands and pancreas.  Common bile duct measures up to 7 mm which is upper limits of normal.  There is mild prominence at the ampulla which could be normal since the patient's total bilirubin is not elevated. Incidentally, the patient probably has a pancreatic divisum.  There are atherosclerotic calcifications involving the abdominal aorta without aneurysmal dilatation.  Prostate is nodular and prominent and there is wall thickening of the urinary bladder despite containing a large amount of fluid. There is a small bladder diverticulum along the  right posterior aspect of the bladder.  The patient has penile prosthesis with a pump or reservoir in the anterior right hemi pelvis.  There is a large amount stool, particularly in the right colon.  No acute bony abnormality.  IMPRESSION: No acute abnormalities within the abdomen and pelvis.  Moderate-sized hiatal hernia.  Cannot exclude wall thickening along the upper aspect of the hernia as described.  Wall thickening of the urinary bladder, particularly along the anterior aspect.  Findings may be related to chronic bladder outlet obstruction since the prostate is enlarged and there is a bladder diverticulum.  There is mild dilatation of the common bile duct as described.  Large amount of stool in the colon, particularly on the right side.   Original Report Authenticated By: Markus Daft, M.D.      1. Lower abdominal pain, unspecified laterality       MDM  BP 155/95  Pulse 97  Temp(Src) 98.6 F (37 C) (Oral)  Resp 18  SpO2 99%  I have reviewed nursing notes and vital signs. I personally reviewed the imaging tests through PACS system  I reviewed available ER/hospitalization records thought the EMR         Domenic Moras, PA-C 10/10/12 1455

## 2012-10-10 NOTE — ED Notes (Signed)
Pt c/o cramping abdominal pain x 3 days, last vomiting yesterday, denies diarrhea

## 2012-10-10 NOTE — ED Notes (Signed)
Returned from CT.

## 2012-10-10 NOTE — ED Provider Notes (Signed)
Medical screening examination/treatment/procedure(s) were conducted as a shared visit with non-physician practitioner(s) and myself.  I personally evaluated the patient during the encounter  Patient seen examined. Informed to followup with his primary care Dr. for his renal insufficiency. Given referral to urology for possible enlarged prostate. His abdominal exam is nonacute  Leota Jacobsen, MD 10/10/12 1428

## 2012-10-10 NOTE — ED Notes (Signed)
pts fluid bolus still infusing.

## 2012-10-11 LAB — CBC WITH DIFFERENTIAL/PLATELET
Basophils Relative: 0 % (ref 0–1)
Eosinophils Absolute: 0 10*3/uL (ref 0.0–0.7)
HCT: 44.9 % (ref 39.0–52.0)
Hemoglobin: 17.1 g/dL — ABNORMAL HIGH (ref 13.0–17.0)
Lymphs Abs: 1 10*3/uL (ref 0.7–4.0)
MCH: 32.4 pg (ref 26.0–34.0)
MCHC: 35.6 g/dL (ref 30.0–36.0)
Monocytes Absolute: 0.4 10*3/uL (ref 0.1–1.0)
Monocytes Relative: 4 % (ref 3–12)
Neutro Abs: 8 10*3/uL — ABNORMAL HIGH (ref 1.7–7.7)

## 2012-10-14 NOTE — ED Provider Notes (Signed)
Medical screening examination/treatment/procedure(s) were performed by non-physician practitioner and as supervising physician I was immediately available for consultation/collaboration.  Leota Jacobsen, MD 10/14/12 [phone]

## 2012-12-06 ENCOUNTER — Emergency Department (HOSPITAL_COMMUNITY): Payer: Medicare Other

## 2012-12-06 ENCOUNTER — Encounter (HOSPITAL_COMMUNITY): Payer: Self-pay | Admitting: Emergency Medicine

## 2012-12-06 ENCOUNTER — Observation Stay (HOSPITAL_COMMUNITY)
Admission: EM | Admit: 2012-12-06 | Discharge: 2012-12-07 | Disposition: A | Discharge status: Home / Self Care | Payer: Medicare Other | Attending: Internal Medicine | Admitting: Internal Medicine

## 2012-12-06 DIAGNOSIS — K5289 Other specified noninfective gastroenteritis and colitis: Secondary | ICD-10-CM | POA: Insufficient documentation

## 2012-12-06 DIAGNOSIS — K449 Diaphragmatic hernia without obstruction or gangrene: Secondary | ICD-10-CM | POA: Insufficient documentation

## 2012-12-06 DIAGNOSIS — D72829 Elevated white blood cell count, unspecified: Secondary | ICD-10-CM | POA: Insufficient documentation

## 2012-12-06 DIAGNOSIS — E871 Hypo-osmolality and hyponatremia: Principal | ICD-10-CM | POA: Insufficient documentation

## 2012-12-06 DIAGNOSIS — F209 Schizophrenia, unspecified: Secondary | ICD-10-CM | POA: Diagnosis present

## 2012-12-06 DIAGNOSIS — R55 Syncope and collapse: Secondary | ICD-10-CM | POA: Insufficient documentation

## 2012-12-06 DIAGNOSIS — R9431 Abnormal electrocardiogram [ECG] [EKG]: Secondary | ICD-10-CM | POA: Insufficient documentation

## 2012-12-06 DIAGNOSIS — I10 Essential (primary) hypertension: Secondary | ICD-10-CM | POA: Insufficient documentation

## 2012-12-06 DIAGNOSIS — E876 Hypokalemia: Secondary | ICD-10-CM | POA: Diagnosis present

## 2012-12-06 DIAGNOSIS — I959 Hypotension, unspecified: Secondary | ICD-10-CM | POA: Insufficient documentation

## 2012-12-06 DIAGNOSIS — N179 Acute kidney failure, unspecified: Secondary | ICD-10-CM | POA: Diagnosis present

## 2012-12-06 LAB — RAPID URINE DRUG SCREEN, HOSP PERFORMED
Barbiturates: NOT DETECTED
Benzodiazepines: NOT DETECTED
Cocaine: NOT DETECTED

## 2012-12-06 LAB — BASIC METABOLIC PANEL
Chloride: 96 mEq/L (ref 96–112)
GFR calc Af Amer: 55 mL/min — ABNORMAL LOW (ref >90)
GFR calc non Af Amer: 48 mL/min — ABNORMAL LOW (ref >90)
Potassium: 3.2 mEq/L — ABNORMAL LOW (ref 3.5–5.1)
Sodium: 129 mEq/L — ABNORMAL LOW (ref 135–145)

## 2012-12-06 LAB — POCT I-STAT TROPONIN I: Troponin i, poc: 0.03 ng/mL (ref 0.00–0.08)

## 2012-12-06 LAB — POCT I-STAT 3, VENOUS BLOOD GAS (G3P V)
TCO2: 26 mmol/L (ref 0–100)
pCO2, Ven: 44.2 mmHg — ABNORMAL LOW (ref 45.0–50.0)
pH, Ven: 7.35 — ABNORMAL HIGH (ref 7.250–7.300)
pO2, Ven: 33 mmHg (ref 30.0–45.0)

## 2012-12-06 LAB — CBC WITH DIFFERENTIAL/PLATELET
Basophils Absolute: 0 10*3/uL (ref 0.0–0.1)
Basophils Relative: 0 % (ref 0–1)
Eosinophils Absolute: 0 10*3/uL (ref 0.0–0.7)
Hemoglobin: 13.9 g/dL (ref 13.0–17.0)
MCH: 31.5 pg (ref 26.0–34.0)
MCHC: 37 g/dL — ABNORMAL HIGH (ref 30.0–36.0)
Neutro Abs: 10.9 10*3/uL — ABNORMAL HIGH (ref 1.7–7.7)
Neutrophils Relative %: 79 % — ABNORMAL HIGH (ref 43–77)
Platelets: 240 10*3/uL (ref 150–400)
RDW: 11.7 % (ref 11.5–15.5)

## 2012-12-06 LAB — URINALYSIS, ROUTINE W REFLEX MICROSCOPIC
Bilirubin Urine: NEGATIVE
Leukocytes, UA: NEGATIVE
Nitrite: NEGATIVE
Specific Gravity, Urine: 1.011 (ref 1.005–1.030)
Urobilinogen, UA: 1 mg/dL (ref 0.0–1.0)
pH: 6 (ref 5.0–8.0)

## 2012-12-06 MED ORDER — ENOXAPARIN SODIUM 40 MG/0.4ML ~~LOC~~ SOLN
40.0000 mg | SUBCUTANEOUS | Status: DC
  Administered 2012-12-06: 40 mg via SUBCUTANEOUS
  Filled 2012-12-06 (×2): qty 0.4

## 2012-12-06 MED ORDER — SODIUM CHLORIDE 0.9 % IV SOLN
Freq: Once | INTRAVENOUS | Status: AC
  Administered 2012-12-06: 14:00:00 via INTRAVENOUS

## 2012-12-06 MED ORDER — NICOTINE 21 MG/24HR TD PT24
21.0000 mg | MEDICATED_PATCH | Freq: Every day | TRANSDERMAL | Status: DC
  Administered 2012-12-06 – 2012-12-07 (×2): 21 mg via TRANSDERMAL
  Filled 2012-12-06 (×2): qty 1

## 2012-12-06 MED ORDER — ACETAMINOPHEN 650 MG RE SUPP: 650.0000 mg | Freq: Four times a day (QID) | RECTAL | Status: DC | PRN

## 2012-12-06 MED ORDER — POTASSIUM CHLORIDE CRYS ER 20 MEQ PO TBCR
40.0000 meq | EXTENDED_RELEASE_TABLET | Freq: Once | ORAL | Status: AC
  Administered 2012-12-06: 40 meq via ORAL
  Filled 2012-12-06: qty 2

## 2012-12-06 MED ORDER — POLYETHYLENE GLYCOL 3350 17 G PO PACK
17.0000 g | PACK | Freq: Every day | ORAL | Status: DC | PRN
  Filled 2012-12-06: qty 1

## 2012-12-06 MED ORDER — SODIUM CHLORIDE 0.9 % IV BOLUS (SEPSIS)
1000.0000 mL | Freq: Once | INTRAVENOUS | Status: AC
  Administered 2012-12-06: 1000 mL via INTRAVENOUS

## 2012-12-06 MED ORDER — ALUM & MAG HYDROXIDE-SIMETH 200-200-20 MG/5ML PO SUSP: 30.0000 mL | Freq: Four times a day (QID) | ORAL | Status: DC | PRN

## 2012-12-06 MED ORDER — HALOPERIDOL 2 MG PO TABS
2.0000 mg | ORAL_TABLET | Freq: Every day | ORAL | Status: DC
  Administered 2012-12-06 – 2012-12-07 (×2): 2 mg via ORAL
  Filled 2012-12-06 (×2): qty 1

## 2012-12-06 MED ORDER — ONDANSETRON HCL 4 MG PO TABS: 4.0000 mg | ORAL_TABLET | ORAL | Status: DC | PRN

## 2012-12-06 MED ORDER — ACETAMINOPHEN 325 MG PO TABS: 650.0000 mg | ORAL_TABLET | Freq: Four times a day (QID) | ORAL | Status: DC | PRN

## 2012-12-06 MED ORDER — SODIUM CHLORIDE 0.9 % IV SOLN
INTRAVENOUS | Status: DC
  Administered 2012-12-06: 22:00:00 via INTRAVENOUS
  Administered 2012-12-06: 1000 mL via INTRAVENOUS
  Administered 2012-12-07: 04:00:00 via INTRAVENOUS

## 2012-12-06 MED ORDER — PANTOPRAZOLE SODIUM 40 MG PO TBEC
40.0000 mg | DELAYED_RELEASE_TABLET | Freq: Every day | ORAL | Status: DC
  Administered 2012-12-07: 40 mg via ORAL
  Filled 2012-12-06: qty 1

## 2012-12-06 MED ORDER — SODIUM CHLORIDE 0.9 % IJ SOLN
3.0000 mL | Freq: Two times a day (BID) | INTRAMUSCULAR | Status: DC
  Administered 2012-12-06: 3 mL via INTRAVENOUS

## 2012-12-06 NOTE — Progress Notes (Signed)
Pt's bp manually was 70/42 at 2024. NP on call [facility] notified. 1 L bolus of NS was ordered. Post bolus pt's bp was 122/68 manually. Pt asymptomatic. Will continue to monitor the pt. Hoover Brunette

## 2012-12-06 NOTE — H&P (Signed)
[facility] History and Physical  Gerald Nelson [id number] DOB: [birth year] DOA: 12/06/2012  Referring physician: ED PCP: [name], MD    Chief Complaint:  Chief Complaint  Patient presents with  . Hypotension     HPI: Gerald Nelson is a 62 y.o. male With past medical history of hypertension on 4 different blood pressure medications for what was brought from home today after he passed out. Upon arrival in the emergency room the patient was found to be hypotensive into the 80s. Patient reports that he vomited Sunday and Monday and he had poor oral intake yesterday and today. He denies any abdominal pain or chest pain. He denies focal weakness or numbness and denies vertigo. He has taken all his medications as prescribed including this morning. After a bolus of 1000 cc of normal saline the patient's blood pressure has improved in the emergency room. He is not confused and is alert and oriented x4.    Review of Systems: The patient denies anorexia, fever, weight loss,, vision loss, decreased hearing, hoarseness, chest pain, dyspnea on exertion, peripheral edema, balance deficits, hemoptysis, abdominal pain, melena, hematochezia, severe indigestion/heartburn, hematuria, incontinence, genital sores, muscle weakness, suspicious skin lesions, transient blindness, difficulty walking, depression, unusual weight change, abnormal bleeding, enlarged lymph nodes, angioedema   Past Medical History  Diagnosis Date  . Hypertension   . Schizophrenia    Past Surgical History  Procedure Laterality Date  . Penile prosthesis implant     Social History:  reports that he has been smoking Cigarettes.  He has been smoking about 1.50 packs per day. He does not have any smokeless tobacco history on file. He reports that he does not drink alcohol or use illicit drugs. Lives with friends  Allergies  Allergen Reactions  . Sulfa Antibiotics Nausea Only    Family History  Problem Relation Age of  Onset  . Family history unknown: Yes    Prior to Admission medications   Medication Sig Start Date End Date Taking? Authorizing Provider  cloNIDine (CATAPRES) 0.2 MG tablet Take 0.2 mg by mouth 2 (two) times daily.    Yes Historical Provider, MD  diltiazem (DILACOR XR) 240 MG 24 hr capsule Take 240 mg by mouth daily.     Yes Historical Provider, MD  haloperidol (HALDOL) 1 MG tablet Take 2 mg by mouth daily.    Yes Historical Provider, MD  lisinopril-hydrochlorothiazide (PRINZIDE,ZESTORETIC) 20-12.5 MG per tablet Take 1 tablet by mouth daily.     Yes Historical Provider, MD  ondansetron (ZOFRAN) 4 MG tablet Take 4 mg by mouth every 4 (four) hours as needed for nausea.   Yes Historical Provider, MD  polyethylene glycol powder (GLYCOLAX/MIRALAX) powder Take 17 g by mouth daily. 10/10/12  Yes Domenic Moras, PA-C   Physical Exam: Filed Vitals:   12/06/12 1240 12/06/12 1245 12/06/12 1300 12/06/12 1422  BP: 96/64 91/62 104/64   Pulse: 85 76    Temp:    97.9 F (36.6 C)  TempSrc:      Resp:  19 20   SpO2:  93%       General:  Alert and oriented x4  Eyes: Pupil equal round react to light accommodation, extraocular movement intact, no nystagmus  ENT: Clear pharynx without exudates  Neck: No jugular venous distention  Cardiovascular: Regular rate and rhythm without murmurs rubs or gallops  Respiratory: Clear to auscultation bilaterally without wheezes rhonchi crackles  Abdomen: Soft nontender bowel sounds are present  Skin: Warm dry without rashes  Musculoskeletal: Intact  Psychiatric: Euthymic  Neurologic: Intact  Labs on Admission:  Basic Metabolic Panel:  Recent Labs Lab 12/06/12 1400  NA 129*  K 3.2*  CL 96  CO2 23  GLUCOSE 95  BUN 24*  CREATININE 1.54*  CALCIUM 8.4  MG 2.0   Liver Function Tests: No results found for this basename: AST, ALT, ALKPHOS, BILITOT, PROT, ALBUMIN,  in the last 168 hours No results found for this basename: LIPASE, AMYLASE,  in the last  168 hours No results found for this basename: AMMONIA,  in the last 168 hours CBC:  Recent Labs Lab 12/06/12 1400  WBC 13.9*  NEUTROABS 10.9*  HGB 13.9  HCT 37.6*  MCV 85.3  PLT 240   Cardiac Enzymes: No results found for this basename: CKTOTAL, CKMB, CKMBINDEX, TROPONINI,  in the last 168 hours  BNP (last 3 results) No results found for this basename: PROBNP,  in the last 8760 hours CBG: No results found for this basename: GLUCAP,  in the last 168 hours  Radiological Exams on Admission: Ct Head Wo Contrast  12/06/2012   *RADIOLOGY REPORT*  Clinical Data: Fall, syncope, laceration and  CT HEAD WITHOUT CONTRAST  Technique:  Contiguous axial images were obtained from the base of the skull through the vertex without contrast.  Comparison: None.  Findings: No skull fracture is noted.  Paranasal sinuses and mastoid air cells are unremarkable.  No intracranial hemorrhage, mass effect or midline shift.  No acute infarction.  No mass lesion is noted on this unenhanced scan.  No intra or extra-axial fluid collection.  IMPRESSION: No acute intracranial abnormality.   Original Report Authenticated By: Lahoma Crocker, M.D.   US Aorta  12/06/2012   *RADIOLOGY REPORT*  Clinical Data:  Hypotension, syncope  ULTRASOUND OF ABDOMINAL AORTA  Technique:  Ultrasound examination of the abdominal aorta was performed to evaluate for abdominal aortic aneurysm.  Comparison: Prior CT abdomen/pelvis 10/10/2012  Abdominal Aorta:  No aneurysm identified. Heterogeneous calcified atherosclerotic plaque throughout the abdominal aorta.        Maximum AP diameter:  2.7 cm.       Maximum TRV diameter:  2.7 cm.  IMPRESSION:  No abdominal aortic aneurysm identified. Fairly extensive heterogeneous atherosclerotic vascular calcifications throughout the abdominal aorta.   Original Report Authenticated By: Jacqulynn Cadet, M.D.   Dg Chest Port 1 View  12/06/2012   *RADIOLOGY REPORT*  Clinical Data: Hypotension  PORTABLE CHEST - 1 VIEW   Comparison: 12/06/2012  Findings: Cardiomediastinal silhouette is stable.  No acute infiltrate or pleural effusion.  No pulmonary edema.  Mild degenerative changes thoracic spine.  IMPRESSION: No active disease.  No significant change.   Original Report Authenticated By: Lahoma Crocker, M.D.    EKG: Independently reviewed. Normal sinus rhythm, QT interval 495 ms, left atrial enlargement, right atrial enlargement, repolarization changes in 3 and aVF  Assessment/Plan Principal Problem:   Syncope Active Problems:   Hypotension   Prolonged QT interval   Leukocytosis   Schizophrenia   Hypokalemia   Hyponatremia   AKI (acute kidney injury)   Hiatal hernia   1. Syncopal event in the setting of hypokalemia, hyponatremia, hypotension with EKG changes. Most likely orthostasis but cannot rule out arrhythmia. Doubt acute coronary syndrome or MI but given the patient's age it would be prudent to keep him on telemetry and cycle troponins. Observe on telemetry overnight and obtain an echocardiogram in the morning. 2. Leukocytosis-most likely reactive to the hypotension-repeat CBC in the morning 3. Acute  kidney injury from diuretics-start IV fluids-repeat basic metabolic profile in the morning 4. Hypokalemia-we'll replete 5. Hiatal hernia-start a PPI 6. Schizophrenia-continue Haldol 7. DVT prophylaxis will be done using Lovenox    Code Status: full code  Family Communication: patient  Disposition Plan: home   Worthy Boschert [facility] Pager [phone]  If 7PM-7AM, please contact night-coverage [email or web address] Password [facility] 12/06/2012, 4:22 PM

## 2012-12-06 NOTE — ED Provider Notes (Signed)
History     CSN: [id number]  Arrival date & time 12/06/12  1216   First MD Initiated Contact with Patient 12/06/12 1228      Chief Complaint  Patient presents with  . Hypotension    (Consider location/radiation/quality/duration/timing/severity/associated sxs/prior treatment) HPI Comments: Pt to ED via EMS for hypotension. Pt has hx of HTN, and Schizophrenia, on Halodol. Pt reports having a witnessed syncopal episode prior to ED arrival. Pt reports being on the porch with family, and he just collapsed. His BP was low per EMS. Pt denies any prodrome prior to the episode and doesn't recall the syncope. There was no post ictal type confusion when he came out of his syncope.  Pt was seen at [facility] today by Dr. Lanell Persons and was found hypotensive. Denies GI bleed. Does reports some nausea and diarrhea over the weekend. No recent infections. Denies drug use. No hx of MI, syncope.  The history is provided by the patient and medical records.    Past Medical History  Diagnosis Date  . Hypertension   . Psychiatric diagnosis     Pt did not know what it is  . Schizophrenia     Past Surgical History  Procedure Laterality Date  . Penile prosthesis implant      History reviewed. No pertinent family history.  History  Substance Use Topics  . Smoking status: Current Every Day Smoker  . Smokeless tobacco: Not on file  . Alcohol Use: No      Review of Systems  Constitutional: Negative for fever, chills and activity change.  HENT: Negative for neck pain.   Eyes: Negative for visual disturbance.  Respiratory: Negative for cough, chest tightness and shortness of breath.   Cardiovascular: Negative for chest pain.  Gastrointestinal: Negative for abdominal distention.  Genitourinary: Negative for dysuria, enuresis and difficulty urinating.  Musculoskeletal: Negative for arthralgias.  Skin: Negative for color change.  Neurological: Positive for syncope. Negative for dizziness,  seizures, light-headedness and headaches.  Psychiatric/Behavioral: Negative for confusion.    Allergies  Sulfa antibiotics  Home Medications   Current Outpatient Rx  Name  Route  Sig  Dispense  Refill  . cloNIDine (CATAPRES) 0.2 MG tablet   Oral   Take 0.2 mg by mouth 2 (two) times daily.          Marland Kitchen diltiazem (DILACOR XR) 240 MG 24 hr capsule   Oral   Take 240 mg by mouth daily.           . haloperidol (HALDOL) 1 MG tablet   Oral   Take 2 mg by mouth daily.          Marland Kitchen lisinopril-hydrochlorothiazide (PRINZIDE,ZESTORETIC) 20-12.5 MG per tablet   Oral   Take 1 tablet by mouth daily.           . ondansetron (ZOFRAN) 4 MG tablet   Oral   Take 1 tablet (4 mg total) by mouth every 6 (six) hours.   12 tablet   0   . polyethylene glycol powder (GLYCOLAX/MIRALAX) powder   Oral   Take 17 g by mouth daily.   255 g   0     BP 91/62  Pulse 76  Temp(Src) 98.6 F (37 C) (Oral)  Resp 19  SpO2 93%  Physical Exam  Nursing note and vitals reviewed. Constitutional: He is oriented to person, place, and time. He appears well-developed.  HENT:  Head: Normocephalic and atraumatic.  Eyes: Conjunctivae and EOM are normal. Pupils are equal,  round, and reactive to light.  Neck: Normal range of motion. Neck supple. No JVD present.  Cardiovascular: Normal rate and regular rhythm.   Pulmonary/Chest: Effort normal and breath sounds normal. No respiratory distress.  Abdominal: Soft. Bowel sounds are normal. He exhibits no distension. There is no tenderness. There is no rebound and no guarding.  Neurological: He is alert and oriented to person, place, and time.  Skin: Skin is warm.    ED Course  Procedures (including critical care time)  Labs Reviewed  CG4 I-STAT (LACTIC ACID) - Abnormal; Notable for the following:    Lactic Acid, Venous 2.67 (*)    All other components within normal limits  CBC WITH DIFFERENTIAL  BASIC METABOLIC PANEL  URINALYSIS, ROUTINE W REFLEX  MICROSCOPIC  URINE RAPID DRUG SCREEN (HOSP PERFORMED)  POCT I-STAT TROPONIN I   US Aorta  12/06/2012   *RADIOLOGY REPORT*  Clinical Data:  Hypotension, syncope  ULTRASOUND OF ABDOMINAL AORTA  Technique:  Ultrasound examination of the abdominal aorta was performed to evaluate for abdominal aortic aneurysm.  Comparison: Prior CT abdomen/pelvis 10/10/2012  Abdominal Aorta:  No aneurysm identified. Heterogeneous calcified atherosclerotic plaque throughout the abdominal aorta.        Maximum AP diameter:  2.7 cm.       Maximum TRV diameter:  2.7 cm.  IMPRESSION:  No abdominal aortic aneurysm identified. Fairly extensive heterogeneous atherosclerotic vascular calcifications throughout the abdominal aorta.   Original Report Authenticated By: Jacqulynn Cadet, M.D.   Dg Chest Port 1 View  12/06/2012   *RADIOLOGY REPORT*  Clinical Data: Hypotension  PORTABLE CHEST - 1 VIEW  Comparison: 12/06/2012  Findings: Cardiomediastinal silhouette is stable.  No acute infiltrate or pleural effusion.  No pulmonary edema.  Mild degenerative changes thoracic spine.  IMPRESSION: No active disease.  No significant change.   Original Report Authenticated By: Lahoma Crocker, M.D.     No diagnosis found.    MDM   Date: 12/06/2012  Rate: 78  Rhythm: normal sinus rhythm  QRS Axis: normal  Intervals: QTc at 497  ST/T Wave abnormalities: normal  Conduction Disutrbances: none  Narrative Interpretation: unremarkable   DDx includes: Orthostatic hypotension Stroke Vertebral artery dissection/stenosis Dysrhythmia PE Vasovagal/neurocardiogenic syncope Aortic stenosis Valvular disorder/Cardiomyopathy Anemia  Pt comes in with cc of syncope. Pt noted to have low BP. EKG shows QT prolongation at 497, no other findings. With the low BP, no sx consistent with infection. Pt had some emesis and diarrhea - but not high volume. Sclerae are icteric - but no liver dz and no alcohol abuse or illicit use.  Will initiate syncope  workup. Will need admission as the BP was low with the syncope and the EKG has QT prolongation.     Varney Biles, MD 12/08/12 0003

## 2012-12-06 NOTE — ED Notes (Signed)
Pt to ED via EMS for hypotension. Pt was seen at [facility] today by Dr. Lanell Persons and was found hypotensive. Pt sent home and when he got home, he had syncopal episode and hit back of his head. Laceration noted on the back of head. Pt is A@O  x4. Pt denies pain. Per BP-86/60, CBG-156, O2-100% on room air. Pt also reports that he had nausea and vomiting on Sunday and Monday.

## 2012-12-07 LAB — BASIC METABOLIC PANEL
CO2: 23 mEq/L (ref 19–32)
Calcium: 8.6 mg/dL (ref 8.4–10.5)
Chloride: 102 mEq/L (ref 96–112)
Creatinine, Ser: 1.13 mg/dL (ref 0.50–1.35)
Glucose, Bld: 111 mg/dL — ABNORMAL HIGH (ref 70–99)

## 2012-12-07 LAB — CBC
HCT: 38.1 % — ABNORMAL LOW (ref 39.0–52.0)
MCH: 31.2 pg (ref 26.0–34.0)
MCV: 86.2 fL (ref 78.0–100.0)
RBC: 4.42 MIL/uL (ref 4.22–5.81)
RDW: 12.1 % (ref 11.5–15.5)
WBC: 11.1 10*3/uL — ABNORMAL HIGH (ref 4.0–10.5)

## 2012-12-07 LAB — TROPONIN I: Troponin I: <0.3 ng/mL (ref <0.30)

## 2012-12-07 MED ORDER — CLONIDINE HCL 0.2 MG PO TABS
0.2000 mg | ORAL_TABLET | Freq: Once | ORAL | Status: AC
  Administered 2012-12-07: 0.2 mg via ORAL
  Filled 2012-12-07: qty 1

## 2012-12-07 MED ORDER — DILTIAZEM HCL ER COATED BEADS 240 MG PO CP24
240.0000 mg | ORAL_CAPSULE | Freq: Every day | ORAL | Status: DC
  Administered 2012-12-07: 240 mg via ORAL
  Filled 2012-12-07: qty 1

## 2012-12-07 NOTE — Progress Notes (Signed)
Physical Therapy Evaluation and D/C Patient Details Name: Gerald Nelson MRN: [id number] DOB: [birth year] Today's Date: 12/07/2012 Time: [id number] PT Time Calculation (min): 19 min  PT Assessment / Plan / Recommendation Clinical Impression  Pt s/p syncope with resolution of symptoms.  Pt states he is at baseline status and was independent with testing today.  No further PT needs.        PT Assessment  Patent does not need any further PT services    Follow Up Recommendations  No PT follow up                Equipment Recommendations  None recommended by PT               Precautions / Restrictions Precautions Precautions: None Restrictions Weight Bearing Restrictions: No   Pertinent Vitals/Pain VSS, No pain      Mobility  Bed Mobility Bed Mobility: Supine to Sit;Sit to Supine Supine to Sit: 7: Independent Sit to Supine: 7: Independent Transfers Transfers: Sit to Stand;Stand to Sit Sit to Stand: 7: Independent;With upper extremity assist;From bed Stand to Sit: 7: Independent;With upper extremity assist;To chair/3-in-1 Ambulation/Gait Ambulation/Gait Assistance: 7: Independent Ambulation Distance (Feet): 500 Feet Assistive device: None Ambulation/Gait Assistance Details: Pt with no LOB with challenges to balance.  No problems with gait noted.  Pt states he is at baseline.   Gait Pattern: Within Functional Limits Gait velocity: community ambulation Stairs: Yes Stairs Assistance: 6: Modified independent (Device/Increase time) Stair Management Technique: One rail Right;Forwards;Alternating pattern Number of Stairs: 6 Wheelchair Mobility Wheelchair Mobility: No     PT Goals  N/A  Visit Information  Last PT Received On: 12/07/12 Assistance Needed: +1    Subjective Data  Subjective: "I do fine." Patient Stated Goal: to go home   Prior [place] Lives With: Friend(s) (with several friends) Available Help at Discharge: Friend(s);Available 24  hours/day Type of Home: House Home Access: Stairs to enter [employer] of Steps: 4 Entrance Stairs-Rails: None Home Layout: One level Home Adaptive Equipment: None Prior Function Level of Independence: Independent Able to Take Stairs?: Yes Driving: Yes Vocation: Unemployed Communication Communication: No difficulties    Cognition  Cognition Arousal/Alertness: Awake/alert Behavior During Therapy: WFL for tasks assessed/performed Overall Cognitive Status: Within Functional Limits for tasks assessed    Extremity/Trunk Assessment Right Lower Extremity Assessment RLE ROM/Strength/Tone: [facility] for tasks assessed Left Lower Extremity Assessment LLE ROM/Strength/Tone: [facility] for tasks assessed Trunk Assessment Trunk Assessment: Normal   Balance Standardized Balance Assessment Standardized Balance Assessment: Dynamic Gait Index Dynamic Gait Index Level Surface: Normal Change in Gait Speed: Normal Gait with Horizontal Head Turns: Normal Gait with Vertical Head Turns: Normal Gait and Pivot Turn: Normal Step Over Obstacle: Normal Step Around Obstacles: Normal Steps: Normal Total Score: 24  End of Session PT - End of Session Equipment Utilized During Treatment: Gait belt Activity Tolerance: Patient tolerated treatment well Patient left: in bed;with call bell/phone within reach Nurse Communication: Mobility status  GP Functional Assessment Tool Used: clinical judgement Functional Limitation: Mobility: Walking and moving around Mobility: Walking and Moving Around Current Status [id number]): 0 percent impaired, limited or restricted Mobility: Walking and Moving Around Goal Status [id number]): 0 percent impaired, limited or restricted Mobility: Walking and Moving Around Discharge Status [phone]): 0 percent impaired, limited or restricted   INGOLD,Kenniya Westrich 12/07/2012, 11:09 AM Jailene Cupit [place],PT Acute Rehabilitation [phone] (pager)

## 2012-12-07 NOTE — Discharge Summary (Signed)
Physician Discharge Summary  Patient ID: Gerald Nelson MRN: [id number] DOB/AGE: 1951-06-19 62 y.o.  Admit date: 12/06/2012 Discharge date: 12/07/2012  Admission Diagnoses:  Discharge Diagnoses:  Principal Problem:   Syncope-due to dehydration Dehydration- after episode of acute gastroenteritis Active Problems:   Hypotension-secondary to dehydration   Prolonged QT interval   Leukocytosis   Schizophrenia   Hypokalemia   Hyponatremia   AKI (acute kidney injury)-resolved with hydration   Hiatal hernia   Discharged Condition: good  Hospital Course: 62 years old male with history of schizoaffective disorder hypertension,tobacco use, recent episode of nausea vomiting diarrhea, was seen in the office that morning went home had a syncopal episode brought to the emergency room with a low blood pressure systolic 60- [id number] Problem #1: syncope: patient cardiac markers were negative he had chest x-ray EKG unremarkable slightly prolonged QT, CT of the head negative he was admitted to telemetry no arrhythmias.-; He also has abdominal ultrasound negative for aneurysm; syncope-  Secondary to dehydration hypotension responded after the fluid hydration. His blood pressure medication was held. Patient had no chest pain or shortness of breath Problem #2 hypertension: patient had low blood pressure because of the acute episode of gastroenteritis his blood pressure medication was held he had IV fluids his blood pressure rebounded back to 160/90 we'll restart back his blood pressure medications with exception of lisinopril hydrochlorothiazide follow blood pressure outpatient History of schizoaffective disorder continue Haldol History of tobacco use continues to smoke tobacco History of weight loss he has some blood work obtained in the office showed some hyper thyroid: which would explain, someweight loss which we'll address that next week outpatient. Hyponatremia and hypokalemia secondary to dehydration and  medication effect: improved after hydration sodium 133 potassium 3.7 Acute renal failure due to dehydration creatinine was up to 1.5 after hydration came dwn to 1.1 Leukocytosis due to dehydration and gastroenteritis: resolved; chest x-ray negative urinalysis negative no fever no evidence of infection He did not have any nausea vomiting: p.o. Intake- regular diet Tobacco abuse discussed about tobacco cessation    Consults: None  Significant Diagnostic Studies: labs: sodium 133 potassium 3.7 creatinine 1.13 urinalysis negative cardiac markers negative CBC hemoglobin 13.8 white count of 11.1 urinalysis negative and radiology: CXR: normal, CT scan: negative and Ultrasound: negative for AAA  Treatments: IV hydration  Discharge Exam: Blood pressure 164/94, pulse 78, temperature 98.5 F (36.9 C), temperature source Oral, resp. rate 16, height 5\' 10"  (1.778 m), weight 64.91 kg (143 lb 1.6 oz), SpO2 99.00%. General appearance: alert Resp: clear to auscultation bilaterally Cardio: regular rate and rhythm GI: soft, non-tender; bowel sounds normal; no masses,  no organomegaly  Disposition: 01-Home or Self Care  Discharge Orders   Future Orders Complete By Expires     Diet - low sodium heart healthy  As directed     Increase activity slowly  As directed         Medication List    STOP taking these medications       lisinopril-hydrochlorothiazide 20-12.5 MG per tablet  Commonly known as:  PRINZIDE,ZESTORETIC     ondansetron 4 MG tablet  Commonly known as:  ZOFRAN      TAKE these medications       cloNIDine 0.2 MG tablet  Commonly known as:  CATAPRES  Take 0.2 mg by mouth 2 (two) times daily.     diltiazem 240 MG 24 hr capsule  Commonly known as:  DILACOR XR  Take 240 mg by mouth daily.  haloperidol 1 MG tablet  Commonly known as:  HALDOL  Take 2 mg by mouth daily.     polyethylene glycol powder powder  Commonly known as:  GLYCOLAX/MIRALAX  Take 17 g by mouth daily.            Follow-up Information   Follow up with [name], MD In 7 days.   Contact information:   [address] [address] [phone]       Signed: [name] 12/07/2012, 8:06 AM

## 2012-12-07 NOTE — Progress Notes (Signed)
Utilization review completed.  

## 2012-12-07 NOTE — Progress Notes (Signed)
Pt's brother called to ask about the pt's status. Pt stated it was okay to give his brother information. Gerald Nelson

## 2013-08-07 ENCOUNTER — Encounter (HOSPITAL_COMMUNITY): Payer: Self-pay | Admitting: Emergency Medicine

## 2013-08-07 ENCOUNTER — Emergency Department (HOSPITAL_COMMUNITY): Payer: Medicare Other

## 2013-08-07 ENCOUNTER — Emergency Department (HOSPITAL_COMMUNITY)
Admission: EM | Admit: 2013-08-07 | Discharge: 2013-08-07 | Disposition: A | Discharge status: Home / Self Care | Payer: Medicare Other | Attending: Emergency Medicine | Admitting: Emergency Medicine

## 2013-08-07 DIAGNOSIS — I10 Essential (primary) hypertension: Secondary | ICD-10-CM | POA: Insufficient documentation

## 2013-08-07 DIAGNOSIS — R112 Nausea with vomiting, unspecified: Secondary | ICD-10-CM | POA: Insufficient documentation

## 2013-08-07 DIAGNOSIS — R1013 Epigastric pain: Secondary | ICD-10-CM | POA: Insufficient documentation

## 2013-08-07 DIAGNOSIS — K449 Diaphragmatic hernia without obstruction or gangrene: Secondary | ICD-10-CM

## 2013-08-07 DIAGNOSIS — F209 Schizophrenia, unspecified: Secondary | ICD-10-CM | POA: Insufficient documentation

## 2013-08-07 DIAGNOSIS — Z7982 Long term (current) use of aspirin: Secondary | ICD-10-CM | POA: Insufficient documentation

## 2013-08-07 DIAGNOSIS — R109 Unspecified abdominal pain: Secondary | ICD-10-CM

## 2013-08-07 DIAGNOSIS — Z79899 Other long term (current) drug therapy: Secondary | ICD-10-CM | POA: Insufficient documentation

## 2013-08-07 DIAGNOSIS — K4190 Unilateral femoral hernia, without obstruction or gangrene, not specified as recurrent: Secondary | ICD-10-CM | POA: Insufficient documentation

## 2013-08-07 DIAGNOSIS — F17200 Nicotine dependence, unspecified, uncomplicated: Secondary | ICD-10-CM | POA: Insufficient documentation

## 2013-08-07 LAB — COMPREHENSIVE METABOLIC PANEL
ALBUMIN: 3.9 g/dL (ref 3.5–5.2)
ALK PHOS: 81 U/L (ref 39–117)
ALT: 19 U/L (ref 0–53)
AST: 24 U/L (ref 0–37)
BUN: 19 mg/dL (ref 6–23)
CALCIUM: 9.5 mg/dL (ref 8.4–10.5)
CO2: 24 mEq/L (ref 19–32)
Chloride: 102 mEq/L (ref 96–112)
Creatinine, Ser: 0.96 mg/dL (ref 0.50–1.35)
GFR calc Af Amer: >90 mL/min (ref >90)
GFR calc non Af Amer: 88 mL/min — ABNORMAL LOW (ref >90)
Glucose, Bld: 135 mg/dL — ABNORMAL HIGH (ref 70–99)
POTASSIUM: 4 meq/L (ref 3.7–5.3)
SODIUM: 142 meq/L (ref 137–147)
TOTAL PROTEIN: 8 g/dL (ref 6.0–8.3)
Total Bilirubin: 0.5 mg/dL (ref 0.3–1.2)

## 2013-08-07 LAB — CBC WITH DIFFERENTIAL/PLATELET
BASOS ABS: 0 10*3/uL (ref 0.0–0.1)
BASOS PCT: 0 % (ref 0–1)
EOS ABS: 0 10*3/uL (ref 0.0–0.7)
Eosinophils Relative: 0 % (ref 0–5)
HCT: 48.1 % (ref 39.0–52.0)
Hemoglobin: 17.6 g/dL — ABNORMAL HIGH (ref 13.0–17.0)
LYMPHS ABS: 0.7 10*3/uL (ref 0.7–4.0)
Lymphocytes Relative: 9 % — ABNORMAL LOW (ref 12–46)
MCH: 31.9 pg (ref 26.0–34.0)
MCHC: 36.6 g/dL — AB (ref 30.0–36.0)
MCV: 87.1 fL (ref 78.0–100.0)
Monocytes Absolute: 0.2 10*3/uL (ref 0.1–1.0)
Monocytes Relative: 3 % (ref 3–12)
NEUTROS PCT: 88 % — AB (ref 43–77)
Neutro Abs: 7.2 10*3/uL (ref 1.7–7.7)
PLATELETS: 263 10*3/uL (ref 150–400)
RBC: 5.52 MIL/uL (ref 4.22–5.81)
RDW: 12.6 % (ref 11.5–15.5)
WBC: 8.1 10*3/uL (ref 4.0–10.5)

## 2013-08-07 LAB — LIPASE, BLOOD: Lipase: 29 U/L (ref 11–59)

## 2013-08-07 MED ORDER — IOHEXOL 300 MG/ML  SOLN
25.0000 mL | Freq: Once | INTRAMUSCULAR | Status: AC | PRN
  Administered 2013-08-07: 25 mL via ORAL

## 2013-08-07 MED ORDER — ONDANSETRON HCL 4 MG/2ML IJ SOLN
4.0000 mg | Freq: Once | INTRAMUSCULAR | Status: AC
  Administered 2013-08-07: 4 mg via INTRAVENOUS
  Filled 2013-08-07: qty 2

## 2013-08-07 MED ORDER — ONDANSETRON 8 MG PO TBDP: 8.0000 mg | ORAL_TABLET | Freq: Three times a day (TID) | ORAL | Status: DC | PRN

## 2013-08-07 MED ORDER — IOHEXOL 300 MG/ML  SOLN
100.0000 mL | Freq: Once | INTRAMUSCULAR | Status: AC | PRN
  Administered 2013-08-07: 100 mL via INTRAVENOUS

## 2013-08-07 MED ORDER — MORPHINE SULFATE 4 MG/ML IJ SOLN
6.0000 mg | Freq: Once | INTRAMUSCULAR | Status: AC
Start: 2013-08-07 — End: 2013-08-07
  Administered 2013-08-07: 6 mg via INTRAVENOUS
  Filled 2013-08-07: qty 2

## 2013-08-07 MED ORDER — OMEPRAZOLE 20 MG PO CPDR: 20.0000 mg | DELAYED_RELEASE_CAPSULE | Freq: Every day | ORAL | Status: DC

## 2013-08-07 MED ORDER — SODIUM CHLORIDE 0.9 % IV SOLN
Freq: Once | INTRAVENOUS | Status: AC
  Administered 2013-08-07: 20 mL/h via INTRAVENOUS

## 2013-08-07 NOTE — ED Notes (Signed)
Per [facility] pt from home, pt c/o RLQ pain and vomiting starting last night. Pt described his pain as dull. No abd distention or masses noted, no fever or diarrhea. VSS BP 166/118, HR 96, Temp 97.2, RR 16. Pt ambulatory to truck and into triage

## 2013-08-07 NOTE — ED Provider Notes (Signed)
CSN: [id number]     Arrival date & time 08/07/13  1448 History   First MD Initiated Contact with Patient 08/07/13 1525     Chief Complaint  Patient presents with  . Abdominal Pain    HPI Patient presents with worsening upper and right upper quadrant abdominal pain since last night.  His had associated nausea vomiting.  He has history of hiatal hernia.  He reports no hematemesis.  He denies diarrhea.  No urinary complaints.  Fevers or chills.  No chest pain shortness of breath.  No cough or congestion.  His pain is moderate in severity at this time.  He vomited yellow gastric material while I was in the room with him.   Past Medical History  Diagnosis Date  . Hypertension   . Schizophrenia    Past Surgical History  Procedure Laterality Date  . Penile prosthesis implant     No family history on file. History  Substance Use Topics  . Smoking status: Current Every Day Smoker -- 1.50 packs/day    Types: Cigarettes  . Smokeless tobacco: Not on file  . Alcohol Use: No    Review of Systems  All other systems reviewed and are negative.    Allergies  Sulfa antibiotics  Home Medications   Current Outpatient Rx  Name  Route  Sig  Dispense  Refill  . aspirin 325 MG EC tablet   Oral   Take 650 mg by mouth daily.         . cloNIDine (CATAPRES) 0.2 MG tablet   Oral   Take 0.2 mg by mouth 2 (two) times daily.          Marland Kitchen diltiazem (DILACOR XR) 240 MG 24 hr capsule   Oral   Take 240 mg by mouth daily.           . haloperidol (HALDOL) 1 MG tablet   Oral   Take 2 mg by mouth daily.          . Multiple Vitamin (MULTIVITAMIN WITH MINERALS) TABS tablet   Oral   Take 1 tablet by mouth daily.         Marland Kitchen omeprazole (PRILOSEC) 20 MG capsule   Oral   Take 1 capsule (20 mg total) by mouth daily.   30 capsule   0   . ondansetron (ZOFRAN ODT) 8 MG disintegrating tablet   Oral   Take 1 tablet (8 mg total) by mouth every 8 (eight) hours as needed for nausea or vomiting.  12 tablet   0    BP 188/107  Pulse 92  Temp(Src) 98.5 F (36.9 C) (Oral)  Resp 18  SpO2 98% Physical Exam  Nursing note and vitals reviewed. Constitutional: He is oriented to person, place, and time. He appears well-developed and well-nourished.  HENT:  Head: Normocephalic and atraumatic.  Eyes: EOM are normal.  Neck: Normal range of motion.  Cardiovascular: Normal rate, regular rhythm, normal heart sounds and intact distal pulses.   Pulmonary/Chest: Effort normal and breath sounds normal. No respiratory distress.  Abdominal: Soft. He exhibits no distension.  Epigastric and right upper quadrant abdominal tenderness.  No guarding or rebound  Musculoskeletal: Normal range of motion.  Neurological: He is alert and oriented to person, place, and time.  Skin: Skin is warm and dry.  Psychiatric: He has a normal mood and affect. Judgment normal.    ED Course  Procedures   EMERGENCY DEPARTMENT [place] ABD/AORTA EXAM Study: Limited Ultrasound of the Abdominal Aorta.  INDICATIONS:Abdominal pain Indication: Multiple views of the abdominal aorta are obtained from the diaphragmatic hiatus to the aortic bifurcation in transverse and sagittal planes with a multi- Frequency probe. PERFORMED BY: Myself IMAGES ARCHIVED?: Yes FINDINGS: Maximum aortic dimensions are 2.4.cm LIMITATIONS:  Bowel gas INTERPRETATION:  No abdominal aortic aneurysm   Labs Review Labs Reviewed  CBC WITH DIFFERENTIAL - Abnormal; Notable for the following:    Hemoglobin 17.6 (*)    MCHC 36.6 (*)    Neutrophils Relative % 88 (*)    Lymphocytes Relative 9 (*)    All other components within normal limits  COMPREHENSIVE METABOLIC PANEL - Abnormal; Notable for the following:    Glucose, Bld 135 (*)    GFR calc non Af Amer 88 (*)    All other components within normal limits  LIPASE, BLOOD  URINALYSIS, ROUTINE W REFLEX MICROSCOPIC   Imaging Review Ct Abdomen Pelvis W Contrast  08/07/2013   CLINICAL DATA:  Upper  abdominal pain, right upper quadrant pain.  EXAM: CT ABDOMEN AND PELVIS WITH CONTRAST  TECHNIQUE: Multidetector CT imaging of the abdomen and pelvis was performed using the standard protocol following bolus administration of intravenous contrast.  CONTRAST:  168mL OMNIPAQUE IOHEXOL 300 MG/ML  SOLN  COMPARISON:  US AORTA dated 12/06/2012; CT ABD/PELVIS W CM dated 10/10/2012  FINDINGS: Small to moderate-sized hiatal hernia, similar to prior study. Heart is normal size. Lung bases are clear.  Liver, gallbladder, spleen, pancreas, adrenals and left kidney are unremarkable. Areas of cortical thinning within the right kidney upper pole. No hydronephrosis or perinephric stranding. No renal or ureteral stones. Small posterior bladder wall diverticulum on the right, stable since prior study. Previously seen urinary bladder wall thickening has resolved. Penile prosthesis is noted in the pelvis.  Stomach, large and small bowel are grossly unremarkable. No free fluid, free air or adenopathy.  Degenerative changes throughout the lumbar spine and at the SI joints. Partial fusion across the SI joints.  IMPRESSION: Small to moderate-sized hiatal hernia, stable.  Cortical thinning in the upper pole of the right kidney.  No acute findings in the abdomen or pelvis.   Electronically Signed   By: Rolm Baptise M.D.   On: 08/07/2013 18:51  I personally reviewed the imaging tests through PACS system I reviewed available ER/hospitalization records through the EMR   EKG Interpretation   None       MDM   1. Abdominal pain   2. Hiatal hernia    Bedside ultrasound demonstrates no evidence of aortic aneurysm.  No significant signs of gallstones or gallbladder wall thickening.  CT scan will be performed.  I suspect this may be his hiatal hernia.  Pain and nausea treated.  Labs pending.  8:26 PM Patient feels much better at this time.  I suspect much of this is do to his hiatal hernia.  Patient will be referred to general surgery as  I think he may be a candidate for surgical repair of his hiatal hernia as this seems to be an intermittent issue for him.  He understands to return to the ER for new or worsening symptoms   Hoy Morn, MD 08/07/13 2026

## 2013-08-07 NOTE — ED Notes (Signed)
Pt resting quietly with eyes closed.  Pt st's pain has subsided.

## 2013-08-07 NOTE — Discharge Instructions (Signed)
Hiatal Hernia A hiatal hernia occurs when a part of the stomach slides above the diaphragm. The diaphragm is the thin muscle separating the belly (abdomen) from the chest. A hiatal hernia can be something you are born with or develop over time. Hiatal hernias may allow stomach acid to flow back into your esophagus, the tube which carries food from your mouth to your stomach. If this acid causes problems it is called GERD (gastro-esophageal reflux disease).  SYMPTOMS  Common symptoms of GERD are heartburn (burning in your chest). This is worse when lying down or bending over. It may also cause belching and indigestion. Some of the things which make GERD worse are:  Increased weight pushes on stomach making acid rise more easily.  Smoking markedly increases acid production.  Alcohol decreases lower esophageal sphincter pressure (valve between stomach and esophagus), allowing acid from stomach into esophagus.  Late evening meals and going to bed with a full stomach increases pressure.  Anything that causes an increase in acid production.  Lower esophageal sphincter incompetence. DIAGNOSIS  Hiatal hernia is often diagnosed with x-rays of your stomach and small bowel. This is called an UGI (upper gastrointestinal x-ray). Sometimes a gastroscopic procedure is done. This is a procedure where your caregiver uses a flexible instrument to look into the stomach and small bowel. HOME CARE INSTRUCTIONS   Try to achieve and maintain an ideal body weight.  Avoid drinking alcoholic beverages.  Stop smoking.  Put the head of your bed on 4 to 6 inch blocks. This will keep your head and esophagus higher than your stomach. If you cannot use blocks, sleep with several pillows under your head and shoulders.  Over-the-counter medications will decrease acid production. Your caregiver can also prescribe medications for this. Take as directed.  1/2 to 1 teaspoon of an antacid taken every hour while awake, with  meals and at bedtime, will neutralize acid.  Do not take aspirin, ibuprofen (Advil or Motrin), or other nonsteroidal anti-inflammatory drugs.  Do not wear tight clothing around your chest or stomach.  Eat smaller meals and eat more frequently. This keeps your stomach from getting too full. Eat slowly.  Do not lie down for 2 or 3 hours after eating. Do not eat or drink anything 1 to 2 hours before going to bed.  Avoid caffeine beverages (colas, coffee, cocoa, tea), fatty foods, citrus fruits and all other foods and drinks that contain acid and that seem to increase the problems.  Avoid bending over, especially after eating. Also avoid straining during bowel movements or when urinating or lifting things. Anything that increases the pressure in your belly increases the amount of acid that may be pushed up into your esophagus. SEEK IMMEDIATE MEDICAL CARE IF:  There is change in location (pain in arms, neck, jaw, teeth or back) of your pain, or the pain is getting worse.  You also experience nausea, vomiting, sweating (diaphoresis), or shortness of breath.  You develop continual vomiting, vomit blood or coffee ground material, have bright red blood in your stools, or have black tarry stools. Some of these symptoms could signal other problems such as heart disease. MAKE SURE YOU:   Understand these instructions.  Monitor your condition.  Contact your caregiver if you are not doing well or are getting worse. Document Released: 09/11/2003 Document Revised: 09/13/2011 Document Reviewed: 06/21/2005 [employer] Patient Information 2014 [employer].  

## 2013-12-05 ENCOUNTER — Emergency Department (HOSPITAL_COMMUNITY)
Admission: EM | Admit: 2013-12-05 | Discharge: 2013-12-05 | Disposition: A | Discharge status: Home / Self Care | Payer: Medicare Other | Attending: Emergency Medicine | Admitting: Emergency Medicine

## 2013-12-05 ENCOUNTER — Encounter (HOSPITAL_COMMUNITY): Payer: Self-pay | Admitting: Emergency Medicine

## 2013-12-05 DIAGNOSIS — Z8719 Personal history of other diseases of the digestive system: Secondary | ICD-10-CM | POA: Insufficient documentation

## 2013-12-05 DIAGNOSIS — Z79899 Other long term (current) drug therapy: Secondary | ICD-10-CM | POA: Insufficient documentation

## 2013-12-05 DIAGNOSIS — I10 Essential (primary) hypertension: Secondary | ICD-10-CM | POA: Insufficient documentation

## 2013-12-05 DIAGNOSIS — F17200 Nicotine dependence, unspecified, uncomplicated: Secondary | ICD-10-CM | POA: Insufficient documentation

## 2013-12-05 DIAGNOSIS — Z8659 Personal history of other mental and behavioral disorders: Secondary | ICD-10-CM | POA: Insufficient documentation

## 2013-12-05 DIAGNOSIS — R112 Nausea with vomiting, unspecified: Secondary | ICD-10-CM | POA: Insufficient documentation

## 2013-12-05 DIAGNOSIS — R109 Unspecified abdominal pain: Secondary | ICD-10-CM

## 2013-12-05 DIAGNOSIS — R1084 Generalized abdominal pain: Secondary | ICD-10-CM | POA: Insufficient documentation

## 2013-12-05 LAB — COMPREHENSIVE METABOLIC PANEL
ALT: 14 U/L (ref 0–53)
AST: 23 U/L (ref 0–37)
Albumin: 4.9 g/dL (ref 3.5–5.2)
Alkaline Phosphatase: 79 U/L (ref 39–117)
BUN: 22 mg/dL (ref 6–23)
CALCIUM: 10.6 mg/dL — AB (ref 8.4–10.5)
CO2: 23 mEq/L (ref 19–32)
CREATININE: 1.34 mg/dL (ref 0.50–1.35)
Chloride: 96 mEq/L (ref 96–112)
GFR calc non Af Amer: 56 mL/min — ABNORMAL LOW (ref >90)
GFR, EST AFRICAN AMERICAN: 65 mL/min — AB (ref >90)
GLUCOSE: 158 mg/dL — AB (ref 70–99)
Potassium: 3.9 mEq/L (ref 3.7–5.3)
SODIUM: 136 meq/L — AB (ref 137–147)
TOTAL PROTEIN: 8.9 g/dL — AB (ref 6.0–8.3)
Total Bilirubin: 0.8 mg/dL (ref 0.3–1.2)

## 2013-12-05 LAB — URINALYSIS, ROUTINE W REFLEX MICROSCOPIC
Bilirubin Urine: NEGATIVE
Glucose, UA: NEGATIVE mg/dL
Hgb urine dipstick: NEGATIVE
Ketones, ur: 15 mg/dL — AB
LEUKOCYTES UA: NEGATIVE
NITRITE: NEGATIVE
Protein, ur: 100 mg/dL — AB
SPECIFIC GRAVITY, URINE: 1.018 (ref 1.005–1.030)
Urobilinogen, UA: 1 mg/dL (ref 0.0–1.0)
pH: 5 (ref 5.0–8.0)

## 2013-12-05 LAB — CBC WITH DIFFERENTIAL/PLATELET
Basophils Absolute: 0 10*3/uL (ref 0.0–0.1)
Basophils Relative: 0 % (ref 0–1)
EOS ABS: 0 10*3/uL (ref 0.0–0.7)
EOS PCT: 0 % (ref 0–5)
HCT: 48 % (ref 39.0–52.0)
Hemoglobin: 17.5 g/dL — ABNORMAL HIGH (ref 13.0–17.0)
LYMPHS ABS: 0.7 10*3/uL (ref 0.7–4.0)
Lymphocytes Relative: 9 % — ABNORMAL LOW (ref 12–46)
MCH: 31.6 pg (ref 26.0–34.0)
MCHC: 36.5 g/dL — ABNORMAL HIGH (ref 30.0–36.0)
MCV: 86.6 fL (ref 78.0–100.0)
Monocytes Absolute: 0.4 10*3/uL (ref 0.1–1.0)
Monocytes Relative: 5 % (ref 3–12)
Neutro Abs: 6.6 10*3/uL (ref 1.7–7.7)
Neutrophils Relative %: 86 % — ABNORMAL HIGH (ref 43–77)
Platelets: 244 10*3/uL (ref 150–400)
RBC: 5.54 MIL/uL (ref 4.22–5.81)
RDW: 12.3 % (ref 11.5–15.5)
WBC: 7.8 10*3/uL (ref 4.0–10.5)

## 2013-12-05 LAB — URINE MICROSCOPIC-ADD ON

## 2013-12-05 LAB — GASTRIC OCCULT BLOOD (1-CARD TO LAB): Occult Blood, Gastric: POSITIVE — AB

## 2013-12-05 LAB — LIPASE, BLOOD: Lipase: 29 U/L (ref 11–59)

## 2013-12-05 MED ORDER — SODIUM CHLORIDE 0.9 % IV SOLN
Freq: Once | INTRAVENOUS | Status: AC
  Administered 2013-12-05: 13:00:00 via INTRAVENOUS

## 2013-12-05 MED ORDER — ONDANSETRON HCL 4 MG/2ML IJ SOLN
4.0000 mg | Freq: Once | INTRAMUSCULAR | Status: AC
  Administered 2013-12-05: 4 mg via INTRAVENOUS
  Filled 2013-12-05: qty 2

## 2013-12-05 MED ORDER — ONDANSETRON HCL 4 MG PO TABS: 4.0000 mg | ORAL_TABLET | Freq: Four times a day (QID) | ORAL | Status: DC

## 2013-12-05 MED ORDER — PANTOPRAZOLE SODIUM 40 MG IV SOLR
40.0000 mg | Freq: Once | INTRAVENOUS | Status: AC
  Administered 2013-12-05: 40 mg via INTRAVENOUS
  Filled 2013-12-05: qty 40

## 2013-12-05 MED ORDER — ESOMEPRAZOLE MAGNESIUM 20 MG PO PACK: 20.0000 mg | PACK | Freq: Every day | ORAL | Status: DC

## 2013-12-05 NOTE — ED Notes (Signed)
Patient given water. Patient made aware they are going home.

## 2013-12-05 NOTE — ED Notes (Signed)
Pt reports generalized abd pain and vomiting since last night. Denies diarrhea. No acute distress noted at triage.

## 2013-12-05 NOTE — ED Notes (Addendum)
Resident at the bedside. Tammi, PA at the bedside.

## 2013-12-05 NOTE — ED Notes (Signed)
Attempted Iv x2. Unable to access. Danielle, RN at the bedside attempting.

## 2013-12-05 NOTE — ED Provider Notes (Signed)
CSN: [id number]     Arrival date & time 12/05/13  1027 History   First MD Initiated Contact with Patient 12/05/13 1158     Chief Complaint  Patient presents with  . Abdominal Pain  . Emesis     (Consider location/radiation/quality/duration/timing/severity/associated sxs/prior Treatment) Patient is a 63 y.o. male presenting with abdominal pain and vomiting. The history is provided by the patient.  Abdominal Pain Pain location:  Generalized Pain quality: aching, burning and dull   Pain quality: not bloating, not sharp, not shooting, not squeezing, not stabbing, no stiffness, not tearing, not throbbing and not tugging   Pain radiates to:  Does not radiate Pain severity:  Moderate Onset quality:  Gradual Duration:  1 day Timing:  Constant Progression:  Unchanged Chronicity:  Recurrent Context: retching   Context: not alcohol use, not diet changes, not eating, not laxative use, not previous surgeries, not recent sexual activity, not recent travel, not sick contacts, not suspicious food intake and not trauma   Relieved by:  Nothing Worsened by:  Nothing tried Ineffective treatments:  None tried Associated symptoms: nausea and vomiting   Associated symptoms: no anorexia, no belching, no chest pain, no chills, no constipation, no cough, no diarrhea, no dysuria, no fatigue, no fever, no flatus, no hematemesis, no hematochezia, no hematuria, no melena and no shortness of breath   Vomiting:    Quality:  Stomach contents and bilious material   Severity:  Moderate   Duration:  1 day   Timing:  Intermittent   Progression:  Unchanged Risk factors: no alcohol abuse, has not had multiple surgeries and no recent hospitalization   Emesis Associated symptoms: abdominal pain   Associated symptoms: no chills and no diarrhea    Patient reports gradual onset of generalized abdominal pain, vomiting that began last evening.  He states he has similar episodes previously and has been seen here for same  in February.  He states that he was given medication at that time which helped his symptoms, but no longer has the medication and he has not been seen by his PMD for this.   Past Medical History  Diagnosis Date  . Hypertension   . Schizophrenia    Past Surgical History  Procedure Laterality Date  . Penile prosthesis implant     History reviewed. No pertinent family history. History  Substance Use Topics  . Smoking status: Current Every Day Smoker -- 1.50 packs/day    Types: Cigarettes  . Smokeless tobacco: Not on file  . Alcohol Use: No    Review of Systems  Constitutional: Negative for fever, chills, appetite change and fatigue.  HENT: Negative for congestion and trouble swallowing.   Respiratory: Negative for cough, chest tightness and shortness of breath.   Cardiovascular: Negative for chest pain.  Gastrointestinal: Positive for nausea, vomiting and abdominal pain. Negative for diarrhea, constipation, blood in stool, melena, hematochezia, anorexia, flatus and hematemesis.  Genitourinary: Negative for dysuria, hematuria, flank pain, decreased urine volume and difficulty urinating.  Musculoskeletal: Negative for back pain.  Skin: Negative for color change and rash.  Neurological: Negative for dizziness, syncope, speech difficulty, weakness and numbness.  Hematological: Negative for adenopathy.  All other systems reviewed and are negative.     Allergies  Sulfa antibiotics  Home Medications   Prior to Admission medications   Medication Sig Start Date End Date Taking? Authorizing Provider  acetaminophen (TYLENOL) 325 MG tablet Take 650 mg by mouth every 6 (six) hours as needed for headache (pain).  Yes Historical Provider, MD  cloNIDine (CATAPRES) 0.2 MG tablet Take 0.2 mg by mouth 2 (two) times daily.    Yes Historical Provider, MD  diltiazem (CARDIZEM CD) 240 MG 24 hr capsule Take 240 mg by mouth daily.  11/03/13  Yes Historical Provider, MD  haloperidol decanoate (HALDOL  DECANOATE) 50 MG/ML injection Inject 50 mg into the muscle every 28 (twenty-eight) days.  11/03/13  Yes Historical Provider, MD  lisinopril-hydrochlorothiazide (PRINZIDE,ZESTORETIC) 20-12.5 MG per tablet Take 1 tablet by mouth daily.  11/05/13  Yes Historical Provider, MD  Multiple Vitamin (MULTIVITAMIN WITH MINERALS) TABS tablet Take 1 tablet by mouth daily.   Yes Historical Provider, MD  Potassium (POTASSIMIN PO) Take 1 tablet by mouth daily.   Yes Historical Provider, MD   BP 171/99  Pulse 98  Temp(Src) 98.4 F (36.9 C) (Oral)  Resp 26  SpO2 100% Physical Exam  Nursing note and vitals reviewed. Constitutional: He is oriented to person, place, and time. He appears well-developed and well-nourished. No distress.  HENT:  Head: Normocephalic and atraumatic.  Mouth/Throat: Oropharynx is clear and moist.  Eyes: No scleral icterus.  Neck: Normal range of motion. Neck supple. No JVD present.  Cardiovascular: Normal rate, regular rhythm, normal heart sounds and intact distal pulses.   No murmur heard. Pulmonary/Chest: Effort normal and breath sounds normal. No respiratory distress.  Abdominal: Soft. Normal appearance and bowel sounds are normal. He exhibits no distension and no mass. There is generalized tenderness. There is no rigidity, no rebound, no guarding, no CVA tenderness and no tenderness at McBurney's point.  Mild , diffuse ttp of the abdomen.  No guarding or rebound tenderness.    Musculoskeletal: Normal range of motion. He exhibits no edema.  Lymphadenopathy:    He has no cervical adenopathy.  Neurological: He is alert and oriented to person, place, and time. He exhibits normal muscle tone. Coordination normal.  Skin: Skin is warm and dry.    ED Course  Procedures (including critical care time) Labs Review Labs Reviewed  CBC WITH DIFFERENTIAL - Abnormal; Notable for the following:    Hemoglobin 17.5 (*)    MCHC 36.5 (*)    Neutrophils Relative % 86 (*)    Lymphocytes Relative  9 (*)    All other components within normal limits  COMPREHENSIVE METABOLIC PANEL - Abnormal; Notable for the following:    Sodium 136 (*)    Glucose, Bld 158 (*)    Calcium 10.6 (*)    Total Protein 8.9 (*)    GFR calc non Af Amer 56 (*)    GFR calc Af Amer 65 (*)    All other components within normal limits  URINALYSIS, ROUTINE W REFLEX MICROSCOPIC - Abnormal; Notable for the following:    Ketones, ur 15 (*)    Protein, ur 100 (*)    All other components within normal limits  URINE MICROSCOPIC-ADD ON - Abnormal; Notable for the following:    Bacteria, UA FEW (*)    All other components within normal limits  POCT GASTRIC OCCULT BLOOD (1-CARD TO LAB) - Abnormal; Notable for the following:    Occult Blood, Gastric POSITIVE (*)    All other components within normal limits  LIPASE, BLOOD  POCT GASTRIC OCCULT BLOOD (1-CARD TO LAB)    Imaging Review No results found.   EKG Interpretation None      MDM   Final diagnoses:  Abdominal pain  H/O hiatal hernia    previous ED chart reviewed by me.  CT abd/pelvis from February showed small to moderate-sized hiatal hernia, stable.   Pt reported vomiting one episode of red colored liquid upon arrival to the exam room, but admitted to drinking red fruit juice.     On recheck, abdomen is soft and non-tender.  No concerning symptoms for acute abdomen at this time. Patient reports history of similar symptoms in the past,  patient was seen here in February and diagnosed with hiatal hernia.   Patient is feeling better after Zofran and protonix and requesting discharge. No further vomiting, nausea resolved.   It is felt that symptoms today are likely related to his hiatal hernia, patient does not currently take any PPIs.  He agrees to plan diet as tolerated, I will prescribe Nexium and Zofran, patient agrees to followup with his primary care physician. He appears stable for discharge at this time and agrees to return here if the sx's worsen.       Desire Fulp L. Vanessa , PA-C 12/06/13 2147

## 2013-12-05 NOTE — Discharge Instructions (Signed)
Abdominal Pain, Adult  Many things can cause belly (abdominal) pain. Most times, the belly pain is not dangerous. Many cases of belly pain can be watched and treated at home.  HOME CARE   · Do not take medicines that help you go poop (laxatives) unless told to by your doctor.  · Only take medicine as told by your doctor.  · Eat or drink as told by your doctor. Your doctor will tell you if you should be on a special diet.  GET HELP IF:  · You do not know what is causing your belly pain.  · You have belly pain while you are sick to your stomach (nauseous) or have runny poop (diarrhea).  · You have pain while you pee or poop.  · Your belly pain wakes you up at night.  · You have belly pain that gets worse or better when you eat.  · You have belly pain that gets worse when you eat fatty foods.  GET HELP RIGHT AWAY IF:   · The pain does not go away within 2 hours.  · You have a fever.  · You keep throwing up (vomiting).  · The pain changes and is only in the right or left part of the belly.  · You have bloody or tarry looking poop.  MAKE SURE YOU:   · Understand these instructions.  · Will watch your condition.  · Will get help right away if you are not doing well or get worse.  Document Released: 12/08/2007 Document Revised: 04/11/2013 Document Reviewed: 02/28/2013  [employer]® Patient Information ©2014 [employer].

## 2013-12-09 NOTE — ED Provider Notes (Signed)
Medical screening examination/treatment/procedure(s) were performed by non-physician practitioner and as supervising physician I was immediately available for consultation/collaboration.   EKG Interpretation None        Fredia Sorrow, MD 12/09/13 1143

## 2013-12-11 ENCOUNTER — Inpatient Hospital Stay (HOSPITAL_COMMUNITY)
Admission: EM | Admit: 2013-12-11 | Discharge: 2013-12-12 | DRG: 065 | Discharge status: Against Medical Advice | Payer: Medicare Other | Attending: Internal Medicine | Admitting: Internal Medicine

## 2013-12-11 ENCOUNTER — Emergency Department (HOSPITAL_COMMUNITY): Payer: Medicare Other

## 2013-12-11 ENCOUNTER — Encounter (HOSPITAL_COMMUNITY): Payer: Self-pay | Admitting: Emergency Medicine

## 2013-12-11 DIAGNOSIS — I6350 Cerebral infarction due to unspecified occlusion or stenosis of unspecified cerebral artery: Principal | ICD-10-CM | POA: Diagnosis present

## 2013-12-11 DIAGNOSIS — E872 Acidosis, unspecified: Secondary | ICD-10-CM | POA: Diagnosis present

## 2013-12-11 DIAGNOSIS — I639 Cerebral infarction, unspecified: Secondary | ICD-10-CM

## 2013-12-11 DIAGNOSIS — F209 Schizophrenia, unspecified: Secondary | ICD-10-CM | POA: Diagnosis present

## 2013-12-11 DIAGNOSIS — F17200 Nicotine dependence, unspecified, uncomplicated: Secondary | ICD-10-CM | POA: Diagnosis present

## 2013-12-11 DIAGNOSIS — K5900 Constipation, unspecified: Secondary | ICD-10-CM

## 2013-12-11 DIAGNOSIS — Z79899 Other long term (current) drug therapy: Secondary | ICD-10-CM

## 2013-12-11 DIAGNOSIS — R55 Syncope and collapse: Secondary | ICD-10-CM | POA: Diagnosis present

## 2013-12-11 DIAGNOSIS — R1013 Epigastric pain: Secondary | ICD-10-CM | POA: Diagnosis present

## 2013-12-11 DIAGNOSIS — I10 Essential (primary) hypertension: Secondary | ICD-10-CM | POA: Diagnosis present

## 2013-12-11 DIAGNOSIS — D72829 Elevated white blood cell count, unspecified: Secondary | ICD-10-CM | POA: Diagnosis present

## 2013-12-11 DIAGNOSIS — Z8249 Family history of ischemic heart disease and other diseases of the circulatory system: Secondary | ICD-10-CM

## 2013-12-11 DIAGNOSIS — N179 Acute kidney failure, unspecified: Secondary | ICD-10-CM | POA: Diagnosis present

## 2013-12-11 DIAGNOSIS — E871 Hypo-osmolality and hyponatremia: Secondary | ICD-10-CM

## 2013-12-11 DIAGNOSIS — Z9181 History of falling: Secondary | ICD-10-CM

## 2013-12-11 HISTORY — DX: Diaphragmatic hernia without obstruction or gangrene: K44.9

## 2013-12-11 LAB — CBC WITH DIFFERENTIAL/PLATELET
BASOS ABS: 0 10*3/uL (ref 0.0–0.1)
BASOS PCT: 0 % (ref 0–1)
EOS PCT: 0 % (ref 0–5)
Eosinophils Absolute: 0 10*3/uL (ref 0.0–0.7)
HCT: 43.5 % (ref 39.0–52.0)
HEMOGLOBIN: 16.5 g/dL (ref 13.0–17.0)
Lymphocytes Relative: 13 % (ref 12–46)
Lymphs Abs: 1.8 10*3/uL (ref 0.7–4.0)
MCH: 31.7 pg (ref 26.0–34.0)
MCHC: 37.9 g/dL — ABNORMAL HIGH (ref 30.0–36.0)
MCV: 83.5 fL (ref 78.0–100.0)
MONOS PCT: 8 % (ref 3–12)
Monocytes Absolute: 1.1 10*3/uL — ABNORMAL HIGH (ref 0.1–1.0)
Neutro Abs: 10.9 10*3/uL — ABNORMAL HIGH (ref 1.7–7.7)
Neutrophils Relative %: 78 % — ABNORMAL HIGH (ref 43–77)
Platelets: 206 10*3/uL (ref 150–400)
RBC: 5.21 MIL/uL (ref 4.22–5.81)
RDW: 11.9 % (ref 11.5–15.5)
WBC: 13.9 10*3/uL — ABNORMAL HIGH (ref 4.0–10.5)

## 2013-12-11 LAB — URINALYSIS, ROUTINE W REFLEX MICROSCOPIC
Bilirubin Urine: NEGATIVE
Glucose, UA: NEGATIVE mg/dL
HGB URINE DIPSTICK: NEGATIVE
Ketones, ur: NEGATIVE mg/dL
Leukocytes, UA: NEGATIVE
Nitrite: NEGATIVE
Protein, ur: NEGATIVE mg/dL
SPECIFIC GRAVITY, URINE: 1.014 (ref 1.005–1.030)
Urobilinogen, UA: 1 mg/dL (ref 0.0–1.0)
pH: 5.5 (ref 5.0–8.0)

## 2013-12-11 LAB — CK: CK TOTAL: 245 U/L — AB (ref 7–232)

## 2013-12-11 LAB — COMPREHENSIVE METABOLIC PANEL
ALBUMIN: 4 g/dL (ref 3.5–5.2)
ALT: 17 U/L (ref 0–53)
AST: 26 U/L (ref 0–37)
Alkaline Phosphatase: 81 U/L (ref 39–117)
BUN: 51 mg/dL — AB (ref 6–23)
CALCIUM: 9.2 mg/dL (ref 8.4–10.5)
CO2: 15 mEq/L — ABNORMAL LOW (ref 19–32)
Chloride: 93 mEq/L — ABNORMAL LOW (ref 96–112)
Creatinine, Ser: 1.96 mg/dL — ABNORMAL HIGH (ref 0.50–1.35)
GFR calc Af Amer: 41 mL/min — ABNORMAL LOW (ref >90)
GFR calc non Af Amer: 35 mL/min — ABNORMAL LOW (ref >90)
Glucose, Bld: 104 mg/dL — ABNORMAL HIGH (ref 70–99)
Potassium: 3.8 mEq/L (ref 3.7–5.3)
Sodium: 130 mEq/L — ABNORMAL LOW (ref 137–147)
Total Bilirubin: 0.6 mg/dL (ref 0.3–1.2)
Total Protein: 7.7 g/dL (ref 6.0–8.3)

## 2013-12-11 LAB — LIPASE, BLOOD: LIPASE: 46 U/L (ref 11–59)

## 2013-12-11 LAB — I-STAT CG4 LACTIC ACID, ED: Lactic Acid, Venous: 0.83 mmol/L (ref 0.5–2.2)

## 2013-12-11 LAB — TROPONIN I: Troponin I: <0.3 ng/mL (ref <0.30)

## 2013-12-11 MED ORDER — SODIUM CHLORIDE 0.9 % IV BOLUS (SEPSIS)
1000.0000 mL | Freq: Once | INTRAVENOUS | Status: AC
  Administered 2013-12-11: 1000 mL via INTRAVENOUS

## 2013-12-11 MED ORDER — IOHEXOL 300 MG/ML  SOLN
25.0000 mL | INTRAMUSCULAR | Status: AC
  Administered 2013-12-11 (×2): 25 mL via ORAL

## 2013-12-11 MED ORDER — ONDANSETRON 4 MG PO TBDP
8.0000 mg | ORAL_TABLET | Freq: Once | ORAL | Status: AC
  Administered 2013-12-11: 8 mg via ORAL
  Filled 2013-12-11: qty 2

## 2013-12-11 NOTE — ED Notes (Signed)
CT called. Patient finished with contrast

## 2013-12-11 NOTE — ED Notes (Signed)
Phlebotomy at bedside.

## 2013-12-11 NOTE — ED Notes (Signed)
Patient c/o abdominal pain for past 3 days, lower abdomen bilateral that is achy in nature, constant.  Rates pain 6/10  Initially patient stated that he vomited everything he tried to eat today but now states he has not vomited in last 24 hours, denies diarrhea (on occasion but not recently), denies problems urinating, last bowel movement yesterday.  Denies bloody, black, or tarry stools.

## 2013-12-11 NOTE — ED Notes (Signed)
Patient transported to CT 

## 2013-12-11 NOTE — ED Provider Notes (Signed)
CSN: [id number]     Arrival date & time 12/11/13  1632 History   First MD Initiated Contact with Patient 12/11/13 1856     Chief Complaint  Patient presents with  . Fall  . Abdominal Pain     (Consider location/radiation/quality/duration/timing/severity/associated sxs/prior Treatment) HPI Comments: The patient is a 63 year old male with past medical history of hypertension, schizophrenia, hiatal hernia presents emergency room chief complaint of abdominal discomfort. The patient reports he was evaluated in the ED 12/05/2013, reports abdominal discomfort resolved for approximately one day and then returned. He reports epigastric discomfort described as intermittent, dull 3/10 currently, 6/10 at its worse. Denies radiation, denies aggravating or relieving factors. He reports vomiting approximately one time 2 days ago last bowel movement was 2 days ago. Denies fever, chills.  The patient reports he had 3 falls approximately 3 days ago. He reports he felt off balance. Denies dizziness, lightheadedness, weakness, numbness, tingling, headache, visual changes. He reports he felt off balance and fell. The patient's friend reports the patient was sitting with one episode and standing with the other episodes.  PCP: [name], MD  Patient is a 63 y.o. male presenting with fall and abdominal pain. The history is provided by the patient and a friend. No language interpreter was used.  Fall Associated symptoms include abdominal pain, nausea and vomiting. Pertinent negatives include no chest pain, chills, diaphoresis, fever, numbness or weakness.  Abdominal Pain Associated symptoms: constipation, nausea and vomiting   Associated symptoms: no chest pain, no chills, no dysuria, no fever, no hematuria and no shortness of breath     Past Medical History  Diagnosis Date  . Hypertension   . Schizophrenia   . Hernia, hiatal    Past Surgical History  Procedure Laterality Date  . Penile prosthesis implant      History reviewed. No pertinent family history. History  Substance Use Topics  . Smoking status: Current Every Day Smoker -- 1.50 packs/day    Types: Cigarettes  . Smokeless tobacco: Not on file  . Alcohol Use: No    Review of Systems  Constitutional: Negative for fever, chills and diaphoresis.  Eyes: Negative for photophobia and visual disturbance.  Respiratory: Negative for shortness of breath.   Cardiovascular: Negative for chest pain and leg swelling.  Gastrointestinal: Positive for nausea, vomiting, abdominal pain and constipation.  Genitourinary: Negative for dysuria and hematuria.  Neurological: Positive for syncope. Negative for dizziness, weakness, light-headedness and numbness.      Allergies  Sulfa antibiotics  Home Medications   Prior to Admission medications   Medication Sig Start Date End Date Taking? Authorizing Provider  acetaminophen (TYLENOL) 325 MG tablet Take 650 mg by mouth every 6 (six) hours as needed for headache (pain).   Yes Historical Provider, MD  cloNIDine (CATAPRES) 0.2 MG tablet Take 0.2 mg by mouth 2 (two) times daily.    Yes Historical Provider, MD  diltiazem (CARDIZEM CD) 240 MG 24 hr capsule Take 240 mg by mouth daily.  11/03/13  Yes Historical Provider, MD  esomeprazole (NEXIUM) 20 MG packet Take 20 mg by mouth daily before breakfast. 12/05/13  Yes Tammy L. Triplett, PA-C  haloperidol decanoate (HALDOL DECANOATE) 50 MG/ML injection Inject 50 mg into the muscle every 28 (twenty-eight) days.  11/03/13  Yes Historical Provider, MD  lisinopril-hydrochlorothiazide (PRINZIDE,ZESTORETIC) 20-12.5 MG per tablet Take 1 tablet by mouth daily.  11/05/13  Yes Historical Provider, MD  Multiple Vitamin (MULTIVITAMIN WITH MINERALS) TABS tablet Take 1 tablet by mouth daily.  Yes Historical Provider, MD  ondansetron (ZOFRAN) 4 MG tablet Take 1 tablet (4 mg total) by mouth every 6 (six) hours. as needed for nausea and vomiting 12/05/13  Yes Tammy L. Triplett, PA-C   Potassium (POTASSIMIN PO) Take 1 tablet by mouth daily.   Yes Historical Provider, MD   BP 135/86  Pulse 86  Temp(Src) 98.1 F (36.7 C) (Oral)  Resp 17  SpO2 100% Physical Exam  Nursing note and vitals reviewed. Constitutional: He is oriented to person, place, and time. He appears well-developed and well-nourished.  Non-toxic appearance. He does not have a sickly appearance. He does not appear ill. No distress.  HENT:  Head: Normocephalic and atraumatic.  Eyes: EOM are normal. Pupils are equal, round, and reactive to light. No scleral icterus.  Neck: Normal range of motion. Neck supple.  Cardiovascular: Normal rate and regular rhythm.   No lower extremity edema  Abdominal: Soft. Normal appearance. He exhibits no distension and no mass. There is no tenderness. There is no rigidity, no rebound, no guarding and no CVA tenderness.  Discomfort non- reproducible with palpation  Musculoskeletal:  Moves all 4 extremitties  Neurological: He is alert and oriented to person, place, and time. No cranial nerve deficit or sensory deficit. GCS eye subscore is 4. GCS verbal subscore is 5. GCS motor subscore is 6.  Speech is clear and goal oriented, follows commands Cranial nerves III - XII grossly intact, no facial droop Normal strength in upper and lower extremities bilaterally, strong and equal grip strength Sensation normal to light touch Moves all 4 extremities equally Finger-nose-finger with mild tremor bilaterally  Skin: Skin is warm and dry. He is not diaphoretic.  Psychiatric: He has a normal mood and affect. His behavior is normal.    ED Course  Procedures (including critical care time) Labs Review Results for orders placed during the hospital encounter of 12/11/13  CBC WITH DIFFERENTIAL      Result Value Ref Range   WBC 13.9 (*) 4.0 - 10.5 K/uL   RBC 5.21  4.22 - 5.81 MIL/uL   Hemoglobin 16.5  13.0 - 17.0 g/dL   HCT 43.5  39.0 - 52.0 %   MCV 83.5  78.0 - 100.0 fL   MCH 31.7   26.0 - 34.0 pg   MCHC 37.9 (*) 30.0 - 36.0 g/dL   RDW 11.9  11.5 - 15.5 %   Platelets 206  150 - 400 K/uL   Neutrophils Relative % 78 (*) 43 - 77 %   Neutro Abs 10.9 (*) 1.7 - 7.7 K/uL   Lymphocytes Relative 13  12 - 46 %   Lymphs Abs 1.8  0.7 - 4.0 K/uL   Monocytes Relative 8  3 - 12 %   Monocytes Absolute 1.1 (*) 0.1 - 1.0 K/uL   Eosinophils Relative 0  0 - 5 %   Eosinophils Absolute 0.0  0.0 - 0.7 K/uL   Basophils Relative 0  0 - 1 %   Basophils Absolute 0.0  0.0 - 0.1 K/uL  COMPREHENSIVE METABOLIC PANEL      Result Value Ref Range   Sodium 130 (*) 137 - 147 mEq/L   Potassium 3.8  3.7 - 5.3 mEq/L   Chloride 93 (*) 96 - 112 mEq/L   CO2 15 (*) 19 - 32 mEq/L   Glucose, Bld 104 (*) 70 - 99 mg/dL   BUN 51 (*) 6 - 23 mg/dL   Creatinine, Ser 1.96 (*) 0.50 - 1.35 mg/dL  Calcium 9.2  8.4 - 10.5 mg/dL   Total Protein 7.7  6.0 - 8.3 g/dL   Albumin 4.0  3.5 - 5.2 g/dL   AST 26  0 - 37 U/L   ALT 17  0 - 53 U/L   Alkaline Phosphatase 81  39 - 117 U/L   Total Bilirubin 0.6  0.3 - 1.2 mg/dL   GFR calc non Af Amer 35 (*) >90 mL/min   GFR calc Af Amer 41 (*) >90 mL/min  URINALYSIS, ROUTINE W REFLEX MICROSCOPIC      Result Value Ref Range   Color, Urine YELLOW  YELLOW   APPearance CLEAR  CLEAR   Specific Gravity, Urine 1.014  1.005 - 1.030   pH 5.5  5.0 - 8.0   Glucose, UA NEGATIVE  NEGATIVE mg/dL   Hgb urine dipstick NEGATIVE  NEGATIVE   Bilirubin Urine NEGATIVE  NEGATIVE   Ketones, ur NEGATIVE  NEGATIVE mg/dL   Protein, ur NEGATIVE  NEGATIVE mg/dL   Urobilinogen, UA 1.0  0.0 - 1.0 mg/dL   Nitrite NEGATIVE  NEGATIVE   Leukocytes, UA NEGATIVE  NEGATIVE  LIPASE, BLOOD      Result Value Ref Range   Lipase 46  11 - 59 U/L  CK      Result Value Ref Range   Total CK 245 (*) 7 - 232 U/L  TROPONIN I      Result Value Ref Range   Troponin I <0.30  <0.30 ng/mL  I-STAT CG4 LACTIC ACID, ED      Result Value Ref Range   Lactic Acid, Venous 0.83  0.5 - 2.2 mmol/L   Ct Abdomen Pelvis Wo  Contrast  12/12/2013   CLINICAL DATA:  Abdominal pain.  Not feeling well.  EXAM: CT ABDOMEN AND PELVIS WITHOUT CONTRAST  TECHNIQUE: Multidetector CT imaging of the abdomen and pelvis was performed following the standard protocol without IV contrast.  COMPARISON:  CT abdomen and pelvis with contrast 08/07/2013.  FINDINGS: Oral contrast was given for the examination. No IV contrast was administered.  Moderate-sized hiatal hernia. Clear lung bases. Normal heart size. No pleural or pericardial effusion.  Unenhanced appearance of the liver, spleen, pancreas, gallbladder, adrenal glands, and kidneys is unremarkable. No renal calculi or hydronephrosis. There is moderate stool burden consistent with constipation. Vascular calcification of the aorta iliac system without aneurysmal dilatation. Moderate degenerative change of the lumbar spine, and SI joints. Moderate bladder distention. Prostatomegaly. Penile prosthesis.  Compared with priors, a similar appearance is noted.  IMPRESSION: Moderate-sized hiatal hernia does not appear incarcerated or obstructive in nature. Constipation. Moderate bladder distention. No acute intra-abdominal or pelvic findings. No lumbar compression deformity.   Electronically Signed   By: Rolla Flatten M.D.   On: 12/12/2013 00:07   Ct Head Wo Contrast  12/11/2013   CLINICAL DATA:  Repetitive falls  EXAM: CT HEAD WITHOUT CONTRAST  TECHNIQUE: Contiguous axial images were obtained from the base of the skull through the vertex without intravenous contrast.  COMPARISON:  12/06/2012  FINDINGS: The bony calvarium is intact. No gross soft tissue abnormality is noted. Mild atrophic changes are noted. No findings to suggest acute hemorrhage, acute infarction or space-occupying mass lesion are noted.  IMPRESSION: Chronic changes without acute abnormality.   Electronically Signed   By: Inez Catalina M.D.   On: 12/11/2013 19:49    EKG Interpretation   Date/Time:  Tuesday December 11 2013 19:11:11  EDT Ventricular Rate:  83 PR Interval:  156 QRS Duration:  108 QT Interval:  403 QTC Calculation: 473 R Axis:   52 Text Interpretation:  Age not entered, assumed to be  63 years old for  purpose of ECG interpretation Sinus rhythm Biatrial enlargement  Anteroseptal infarct, old Nonspecific T abnormalities, lateral leads T  wave changes are new compared to 2014 Confirmed by GOLDSTON  MD, SCOTT  ([id number]) on 12/11/2013 7:19:34 PM      MDM   Final diagnoses:  Acute kidney injury  Hyponatremia  Constipation   Patient presents with ongoing abdominal pain, and 3 falls several days ago. No neurodeficits on exam. We'll CT for further evaluation of multiple falls. Labs sent. CBC shows white blood cell count 13.9. Sodium at 1:30, chloride of 95, CO2 15. BUN elevated at 51, creatinine of 1.96, elevated from BUN 22 and creatinine of 1.34 6 days ago. Cystoscopy with acute kidney injury, questionable vomiting etiology. The patient was also evaluated by Dr. Regenia Skeeter, advises CT with oral contrast for further evaluation of his abdominal pain.  Will admit for abdominal pain, multiple falls, acute kidney injury, hyponatremia. Dr. Ernestina Patches to evaluate the patient in emergency department for admission.   Meds given in ED:  Medications  ondansetron (ZOFRAN-ODT) disintegrating tablet 8 mg (8 mg Oral Given 12/11/13 1731)  sodium chloride 0.9 % bolus 1,000 mL (0 mLs Intravenous Stopped 12/11/13 2325)  iohexol (OMNIPAQUE) 300 MG/ML solution 25 mL (25 mLs Oral Contrast Given 12/11/13 2141)    New Prescriptions   No medications on file         Lorrine Kin, PA-C 12/13/13 1728

## 2013-12-11 NOTE — ED Notes (Signed)
Pt c/o abd pain and "not feeling well" for the past 2 weeks. He was here last week and diagnosed with a hernia but the pain persists. Family states he fell at home 3 times this weekend but he states he did not get hurt. He is a&ox4 now, breathing easily

## 2013-12-12 ENCOUNTER — Inpatient Hospital Stay (HOSPITAL_COMMUNITY): Payer: Medicare Other

## 2013-12-12 ENCOUNTER — Encounter (HOSPITAL_COMMUNITY): Payer: Self-pay | Admitting: Neurology

## 2013-12-12 DIAGNOSIS — N179 Acute kidney failure, unspecified: Secondary | ICD-10-CM

## 2013-12-12 DIAGNOSIS — I639 Cerebral infarction, unspecified: Secondary | ICD-10-CM

## 2013-12-12 DIAGNOSIS — I6350 Cerebral infarction due to unspecified occlusion or stenosis of unspecified cerebral artery: Principal | ICD-10-CM

## 2013-12-12 LAB — CBC
HCT: 42.6 % (ref 39.0–52.0)
HEMATOCRIT: 39.8 % (ref 39.0–52.0)
HEMOGLOBIN: 14.5 g/dL (ref 13.0–17.0)
Hemoglobin: 15.2 g/dL (ref 13.0–17.0)
MCH: 30.6 pg (ref 26.0–34.0)
MCH: 30.7 pg (ref 26.0–34.0)
MCHC: 35.7 g/dL (ref 30.0–36.0)
MCHC: 36.4 g/dL — AB (ref 30.0–36.0)
MCV: 85.9 fL (ref 78.0–100.0)
MCV: 84.3 fL (ref 78.0–100.0)
PLATELETS: 228 10*3/uL (ref 150–400)
Platelets: 213 10*3/uL (ref 150–400)
RBC: 4.96 MIL/uL (ref 4.22–5.81)
RBC: 4.72 MIL/uL (ref 4.22–5.81)
RDW: 12.2 % (ref 11.5–15.5)
RDW: 11.9 % (ref 11.5–15.5)
WBC: 11.5 10*3/uL — ABNORMAL HIGH (ref 4.0–10.5)
WBC: 10.5 10*3/uL (ref 4.0–10.5)

## 2013-12-12 LAB — COMPREHENSIVE METABOLIC PANEL
ALBUMIN: 3.8 g/dL (ref 3.5–5.2)
ALK PHOS: 70 U/L (ref 39–117)
ALK PHOS: 63 U/L (ref 39–117)
ALT: 14 U/L (ref 0–53)
ALT: 15 U/L (ref 0–53)
AST: 18 U/L (ref 0–37)
AST: 21 U/L (ref 0–37)
Albumin: 3.6 g/dL (ref 3.5–5.2)
BILIRUBIN TOTAL: 1 mg/dL (ref 0.3–1.2)
BUN: 34 mg/dL — ABNORMAL HIGH (ref 6–23)
BUN: 29 mg/dL — ABNORMAL HIGH (ref 6–23)
CHLORIDE: 97 meq/L (ref 96–112)
CO2: 21 mEq/L (ref 19–32)
CO2: 25 mEq/L (ref 19–32)
CREATININE: 1.41 mg/dL — AB (ref 0.50–1.35)
Calcium: 9 mg/dL (ref 8.4–10.5)
Calcium: 8.9 mg/dL (ref 8.4–10.5)
Chloride: 98 mEq/L (ref 96–112)
Creatinine, Ser: 1.34 mg/dL (ref 0.50–1.35)
GFR calc Af Amer: 61 mL/min — ABNORMAL LOW (ref >90)
GFR calc Af Amer: 64 mL/min — ABNORMAL LOW (ref >90)
GFR calc non Af Amer: 52 mL/min — ABNORMAL LOW (ref >90)
GFR calc non Af Amer: 56 mL/min — ABNORMAL LOW (ref >90)
GLUCOSE: 99 mg/dL (ref 70–99)
Glucose, Bld: 110 mg/dL — ABNORMAL HIGH (ref 70–99)
POTASSIUM: 3.5 meq/L — AB (ref 3.7–5.3)
POTASSIUM: 3.4 meq/L — AB (ref 3.7–5.3)
SODIUM: 134 meq/L — AB (ref 137–147)
Sodium: 136 mEq/L — ABNORMAL LOW (ref 137–147)
Total Bilirubin: 1.2 mg/dL (ref 0.3–1.2)
Total Protein: 7.1 g/dL (ref 6.0–8.3)
Total Protein: 6.8 g/dL (ref 6.0–8.3)

## 2013-12-12 LAB — RAPID URINE DRUG SCREEN, HOSP PERFORMED
AMPHETAMINES: NOT DETECTED
BARBITURATES: NOT DETECTED
Benzodiazepines: NOT DETECTED
Cocaine: NOT DETECTED
Opiates: NOT DETECTED
TETRAHYDROCANNABINOL: NOT DETECTED

## 2013-12-12 LAB — ETHANOL: Alcohol, Ethyl (B): <11 mg/dL (ref 0–11)

## 2013-12-12 LAB — TROPONIN I

## 2013-12-12 LAB — CK: CK TOTAL: 184 U/L (ref 7–232)

## 2013-12-12 LAB — AMMONIA: AMMONIA: 64 umol/L — AB (ref 11–60)

## 2013-12-12 LAB — MAGNESIUM: Magnesium: 2.1 mg/dL (ref 1.5–2.5)

## 2013-12-12 MED ORDER — HALOPERIDOL DECANOATE 100 MG/ML IM SOLN
50.0000 mg | INTRAMUSCULAR | Status: DC
  Filled 2013-12-12 (×2): qty 0.5

## 2013-12-12 MED ORDER — POLYETHYLENE GLYCOL 3350 17 G PO PACK
17.0000 g | PACK | Freq: Two times a day (BID) | ORAL | Status: DC
  Administered 2013-12-12: 17 g via ORAL
  Filled 2013-12-12 (×2): qty 1

## 2013-12-12 MED ORDER — DILTIAZEM HCL ER COATED BEADS 240 MG PO CP24
240.0000 mg | ORAL_CAPSULE | Freq: Every day | ORAL | Status: DC
  Administered 2013-12-12: 240 mg via ORAL
  Filled 2013-12-12: qty 1

## 2013-12-12 MED ORDER — HEPARIN SODIUM (PORCINE) 5000 UNIT/ML IJ SOLN
5000.0000 [IU] | Freq: Three times a day (TID) | INTRAMUSCULAR | Status: DC
  Administered 2013-12-12: 5000 [IU] via SUBCUTANEOUS
  Filled 2013-12-12 (×4): qty 1

## 2013-12-12 MED ORDER — CLOPIDOGREL BISULFATE 75 MG PO TABS
75.0000 mg | ORAL_TABLET | Freq: Every day | ORAL | Status: DC
  Filled 2013-12-12 (×2): qty 1

## 2013-12-12 MED ORDER — ONDANSETRON HCL 4 MG PO TABS: 8.0000 mg | ORAL_TABLET | Freq: Four times a day (QID) | ORAL | Status: DC | PRN

## 2013-12-12 MED ORDER — PANTOPRAZOLE SODIUM 40 MG IV SOLR
40.0000 mg | Freq: Two times a day (BID) | INTRAVENOUS | Status: DC
  Administered 2013-12-12 (×2): 40 mg via INTRAVENOUS
  Filled 2013-12-12 (×2): qty 40

## 2013-12-12 MED ORDER — ONDANSETRON 8 MG/NS 50 ML IVPB
8.0000 mg | Freq: Four times a day (QID) | INTRAVENOUS | Status: DC | PRN
  Filled 2013-12-12: qty 8

## 2013-12-12 MED ORDER — CLONIDINE HCL 0.2 MG PO TABS
0.2000 mg | ORAL_TABLET | Freq: Two times a day (BID) | ORAL | Status: DC
  Administered 2013-12-12: 0.2 mg via ORAL
  Filled 2013-12-12 (×2): qty 1

## 2013-12-12 MED ORDER — SODIUM CHLORIDE 0.9 % IV SOLN
INTRAVENOUS | Status: DC
  Administered 2013-12-12: 02:00:00 via INTRAVENOUS

## 2013-12-12 MED ORDER — CLOPIDOGREL BISULFATE 75 MG PO TABS: 75.0000 mg | ORAL_TABLET | Freq: Every day | ORAL | Status: DC

## 2013-12-12 MED ORDER — POTASSIUM CHLORIDE IN NACL 20-0.9 MEQ/L-% IV SOLN
INTRAVENOUS | Status: DC
  Filled 2013-12-12 (×2): qty 1000

## 2013-12-12 NOTE — Progress Notes (Signed)
Patient has left AMA. Dr. Karleen Hampshire spoke with patient over the phone. Plavix prescription given to patient. Patient instructed to return through the ED prn.

## 2013-12-12 NOTE — Consult Note (Signed)
Referring Physician: Karleen Hampshire    Chief Complaint: Stroke  HPI:                                                                                                                                         Gerald Nelson is an 63 y.o. male Presenting to hospital with Abdominal pain and falls. On admission he was being evaluated for abdominal pain, elevated BUN/Cr, hyponatremia and syncope.  During syncope evaluation MRI of brain was obtained.  On MRI it was noted he had a punctate acute infarction in the white matter of the left posterior temporal occipital junction region. Neurology was consulted due to MRI findings.   Upon walking in the room he is very anxious and desiring to leave.  He will not give a good history due to his perseveration on the fact he is unable to afford any test and prefers Dr. Deforest Hoyles to give him directions on his medical car.  He states he has fallen in the past but cannot give a good history of his falls.  He denies any lateralizing or focal symptoms. He denies any visual changes, weakness, numbness.   Date last known well: Unable to determine Time last known well: Unable to determine tPA Given: No: No LSN  Past Medical History  Diagnosis Date  . Hypertension   . Schizophrenia   . Hernia, hiatal     Past Surgical History  Procedure Laterality Date  . Penile prosthesis implant      Family History  Problem Relation Age of Onset  . Hypertension Mother   . Hypertension Father    Social History:  reports that he has been smoking Cigarettes.  He has been smoking about 1.50 packs per day. He does not have any smokeless tobacco history on file. He reports that he does not drink alcohol or use illicit drugs.  Allergies:  Allergies  Allergen Reactions  . Sulfa Antibiotics Nausea Only    Medications:                                                                                                                           Scheduled: . cloNIDine  0.2 mg Oral BID  .  diltiazem  240 mg Oral Daily  . haloperidol decanoate  50 mg Intramuscular Q28 days  . heparin subcutaneous  5,000 Units Subcutaneous 3 times per day  .  pantoprazole (PROTONIX) IV  40 mg Intravenous Q12H  . polyethylene glycol  17 g Oral BID    ROS:                                                                                                                                       History obtained from the patient  General ROS: negative for - chills, fatigue, fever, night sweats, weight gain or weight loss Psychological ROS: negative for - behavioral disorder, hallucinations, memory difficulties, mood swings or suicidal ideation Ophthalmic ROS: negative for - blurry vision, double vision, eye pain or loss of vision ENT ROS: negative for - epistaxis, nasal discharge, oral lesions, sore throat, tinnitus or vertigo Allergy and Immunology ROS: negative for - hives or itchy/watery eyes Hematological and Lymphatic ROS: negative for - bleeding problems, bruising or swollen lymph nodes Endocrine ROS: negative for - galactorrhea, hair pattern changes, polydipsia/polyuria or temperature intolerance Respiratory ROS: negative for - cough, hemoptysis, shortness of breath or wheezing Cardiovascular ROS: negative for - chest pain, dyspnea on exertion, edema or irregular heartbeat Gastrointestinal ROS: negative for - abdominal pain, diarrhea, hematemesis, nausea/vomiting or stool incontinence Genito-Urinary ROS: negative for - dysuria, hematuria, incontinence or urinary frequency/urgency Musculoskeletal ROS: negative for - joint swelling or muscular weakness Neurological ROS: as noted in HPI Dermatological ROS: negative for rash and skin lesion changes  Neurologic Examination:                                                                                                      Blood pressure 127/87, pulse 85, temperature 97.7 F (36.5 C), temperature source Oral, resp. rate 18, height 5\' 10"  (1.778 m),  weight 63.8 kg (140 lb 10.5 oz), SpO2 95.00%.   Mental Status: Alert, oriented to hospital and year, anxious and wishing to leave the hospital and have no further tests.  Speech fluent without evidence of aphasia.  Able to follow 3 step commands without difficulty. Cranial Nerves: II: Discs flat bilaterally; Visual fields grossly normal, pupils equal, round, reactive to light and accommodation III,IV, VI: ptosis not present, extra-ocular motions intact bilaterally V,VII: smile symmetric, facial light touch sensation normal bilaterally VIII: hearing normal bilaterally IX,X: gag reflex present XI: bilateral shoulder shrug XII: midline tongue extension without atrophy or fasciculations  Motor: Right : Upper extremity   5/5    Left:     Upper extremity   5/5  Lower extremity   5/5     Lower extremity   5/5  Tone and bulk:normal tone throughout; no atrophy noted Sensory: Pinprick and light touch intact throughout, bilaterally Deep Tendon Reflexes:  Right: Upper Extremity   Left: Upper extremity   biceps (C-5 to C-6) 2/4   biceps (C-5 to C-6) 2/4 tricep (C7) 2/4    triceps (C7) 2/4 Brachioradialis (C6) 2/4  Brachioradialis (C6) 2/4  Lower Extremity Lower Extremity  quadriceps (L-2 to L-4) 2/4   quadriceps (L-2 to L-4) 2/4 Achilles (S1) 2/4   Achilles (S1) 2/4  Plantars: Right: downgoing   Left: downgoing Cerebellar: normal finger-to-nos e,  normal heel-to-shin test Gait: normal. Negative romberg.   CV: pulses palpable throughout    Lab Results: Basic Metabolic Panel:  Recent Labs Lab 12/05/13 1202 12/11/13 1847 12/12/13 0155 12/12/13 0648  NA 136* 130* 134* 136*  K 3.9 3.8 3.5* 3.4*  CL 96 93* 97 98  CO2 23 15* 21 25  GLUCOSE 158* 104* 110* 99  BUN 22 51* 34* 29*  CREATININE 1.34 1.96* 1.41* 1.34  CALCIUM 10.6* 9.2 9.0 8.9  MG  --   --   --  2.1    Liver Function Tests:  Recent Labs Lab 12/05/13 1202 12/11/13 1847 12/12/13 0155 12/12/13 0648  AST 23 26 18 21    ALT 14 17 14 15   ALKPHOS 79 81 70 63  BILITOT 0.8 0.6 1.0 1.2  PROT 8.9* 7.7 7.1 6.8  ALBUMIN 4.9 4.0 3.8 3.6    Recent Labs Lab 12/05/13 1202 12/11/13 1847  LIPASE 29 46    Recent Labs Lab 12/12/13 0155  AMMONIA 64*    CBC:  Recent Labs Lab 12/05/13 1202 12/11/13 1847 12/12/13 0155 12/12/13 0648  WBC 7.8 13.9* 11.5* 10.5  NEUTROABS 6.6 10.9*  --   --   HGB 17.5* 16.5 15.2 14.5  HCT 48.0 43.5 42.6 39.8  MCV 86.6 83.5 85.9 84.3  PLT 244 206 228 213    Cardiac Enzymes:  Recent Labs Lab 12/11/13 1847 12/11/13 1950 12/12/13 0155  CKTOTAL 245*  --  184  TROPONINI  --  <0.30 <0.30    Lipid Panel: No results found for this basename: CHOL, TRIG, HDL, CHOLHDL, VLDL, LDLCALC,  in the last 168 hours  CBG: No results found for this basename: GLUCAP,  in the last 168 hours  Microbiology: No results found for this or any previous visit.  Coagulation Studies: No results found for this basename: LABPROT, INR,  in the last 72 hours  Imaging: Ct Abdomen Pelvis Wo Contrast  12/12/2013   CLINICAL DATA:  Abdominal pain.  Not feeling well.  EXAM: CT ABDOMEN AND PELVIS WITHOUT CONTRAST  TECHNIQUE: Multidetector CT imaging of the abdomen and pelvis was performed following the standard protocol without IV contrast.  COMPARISON:  CT abdomen and pelvis with contrast 08/07/2013.  FINDINGS: Oral contrast was given for the examination. No IV contrast was administered.  Moderate-sized hiatal hernia. Clear lung bases. Normal heart size. No pleural or pericardial effusion.  Unenhanced appearance of the liver, spleen, pancreas, gallbladder, adrenal glands, and kidneys is unremarkable. No renal calculi or hydronephrosis. There is moderate stool burden consistent with constipation. Vascular calcification of the aorta iliac system without aneurysmal dilatation. Moderate degenerative change of the lumbar spine, and SI joints. Moderate bladder distention. Prostatomegaly. Penile prosthesis.   Compared with priors, a similar appearance is noted.  IMPRESSION: Moderate-sized hiatal hernia does not appear incarcerated or obstructive in nature. Constipation. Moderate bladder distention. No acute intra-abdominal or pelvic findings. No lumbar compression deformity.  Electronically Signed   By: Rolla Flatten M.D.   On: 12/12/2013 00:07   Ct Head Wo Contrast  12/11/2013   CLINICAL DATA:  Repetitive falls  EXAM: CT HEAD WITHOUT CONTRAST  TECHNIQUE: Contiguous axial images were obtained from the base of the skull through the vertex without intravenous contrast.  COMPARISON:  12/06/2012  FINDINGS: The bony calvarium is intact. No gross soft tissue abnormality is noted. Mild atrophic changes are noted. No findings to suggest acute hemorrhage, acute infarction or space-occupying mass lesion are noted.  IMPRESSION: Chronic changes without acute abnormality.   Electronically Signed   By: Inez Catalina M.D.   On: 12/11/2013 19:49   Mr Brain Wo Contrast  12/12/2013   CLINICAL DATA:  Numerous episodes of falling.  EXAM: MRI HEAD WITHOUT CONTRAST  TECHNIQUE: Multiplanar, multiecho pulse sequences of the brain and surrounding structures were obtained without intravenous contrast.  COMPARISON:  Head CT 12/11/2013  FINDINGS: There is no abnormality of the brainstem or cerebellum. The cerebral hemispheres show abnormal T2 signal in the deep white matter consistent with chronic small vessel change. There is an acute punctate white matter infarction at the left posterior temporal occipital junction region. No large vessel territory infarction. No mass lesion, hemorrhage, hydrocephalus or extra-axial collection. No pituitary mass. No fluid in the sinuses.  IMPRESSION: Punctate acute infarction in the white matter of the left posterior temporal occipital junction region. No swelling or hemorrhage.  Chronic small vessel changes elsewhere affecting the cerebral hemispheric white matter.   Electronically Signed   By: Nelson Chimes  M.D.   On: 12/12/2013 09:54     Assessment and plan discussed with with attending physician and they are in agreement.    Etta Quill PA-C [facility] [phone]  12/12/2013, 11:22 AM    Assessment: 63 y.o. 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 This is incidental and unrelated to his hospitalization. He reports only a single recent fall, and denies frequent recurrent falls. Though incidental,   Stroke Risk Factors - hypertension  Typically I would recommend: 1. HgbA1c, fasting lipid panel 2. MRA  of the brain without contrast 3. PT consult, OT consult, Speech consult are not needed as patient is asymptomatic and does not need rehab. 4. Echocardiogram 5. Carotid dopplers 6. Telemetry monitoring 7. Frequent neuro checks  However he is adamantly refusing any further testing at this time. I would favor at least getting him a prescription for Plavix  - 75mg  daily prior to discharge if he continues to refuse evaluations.   Roland Rack, MD [facility] [phone]  If 7pm- 7am, please page neurology on call as listed in [place].

## 2013-12-12 NOTE — Discharge Summary (Signed)
Physician Discharge Summary  Gerald Nelson [id number] DOB: 1951-02-24 DOA: 12/11/2013  PCP: [name], MD  Admit date: 12/11/2013 Discharge date: 12/12/2013  Pt left AMA.   Discharge Diagnoses:  Active Problems:   CVA (cerebral infarction)     Filed Weights   12/12/13 0208  Weight: 63.8 kg (140 lb 10.5 oz)    History of present illness/Hospital Course:  this is a 63 year old male with a prior history of hypertension, schizophrenia, hiatal hernia presenting with recurrent abdominal pain and falls. Patient states that he's had recurrent abdominal pain for the past 2-3 months. Was seen for symptoms about 2-3 days ago here in the [facility] at that time included chest x-ray, head CT, and aortic ultrasound that were within normal limits. Patient was given Zofran and Protonix with some symptomatic improvement. Patient states he stopped taking medication at 2 days and symptoms seem to recur. Abdominal pain is predominately epigastric though does have some generalized qualities. No radiation. Sensations of a dull ache. Positive associated nausea. One to 2 episodes of nonbilious nonbloody emesis. Patient denies any alcohol abuse, though does report alcohol use in the remote past. One pack per day smoker. Denies polysubstance abuse. Patient also reports to 3 episodes of falls at home. Denies any prodrome he does believe his head. Does not have recollection of falls, just tremors went up on the floor.  Patient presents to the ER today with symptoms. Hemodynamically stable on presentation. Afebrile.note a white blood cell count 13.9., Hgb 14.5, Platelets 206.sodium 130, K 3.4 BUN 51, creatinine 1.6, bicarbonate of 15, anion gap of 22. Lipase and lactate within normal limits. Troponins negative x1. CK level 735. UA non-indicative of infection.head CT within normal limits.CT of the abdomen shows moderate size hernia without incarceration, constipation moderate bladder distention. Otherwise no acute  findings. An MRI of the brain was done for evaluation of recurrent falls, showed incidental finding of a white matter stroke. Neurology consulted , recommended starting pt on plavix and getting rest of the stroke work up. But patient left AMA. He was prescribed plavix .    Discharge Exam: Filed Vitals:   12/12/13 1102  BP: 127/87  Pulse:   Temp:   Resp:     General: alert afebrile comfortable Cardiovascular: s1s2 Respiratory: ctab  Discharge Instructions You were cared for by a hospitalist during your hospital stay. If you have any questions about your discharge medications or the care you received while you were in the hospital after you are discharged, you can call the unit and asked to speak with the hospitalist on call if the hospitalist that took care of you is not available. Once you are discharged, your primary care physician will handle any further medical issues. Please note that NO REFILLS for any discharge medications will be authorized once you are discharged, as it is imperative that you return to your primary care physician (or establish a relationship with a primary care physician if you do not have one) for your aftercare needs so that they can reassess your need for medications and monitor your lab values.     Medication List    TAKE these medications       clopidogrel 75 MG tablet  Commonly known as:  PLAVIX  Take 1 tablet (75 mg total) by mouth daily with breakfast.      ASK your doctor about these medications       acetaminophen 325 MG tablet  Commonly known as:  TYLENOL  Take 650 mg by mouth  every 6 (six) hours as needed for headache (pain).     cloNIDine 0.2 MG tablet  Commonly known as:  CATAPRES  Take 0.2 mg by mouth 2 (two) times daily.     diltiazem 240 MG 24 hr capsule  Commonly known as:  CARDIZEM CD  Take 240 mg by mouth daily.     esomeprazole 20 MG packet  Commonly known as:  NEXIUM  Take 20 mg by mouth daily before breakfast.      haloperidol decanoate 50 MG/ML injection  Commonly known as:  HALDOL DECANOATE  Inject 50 mg into the muscle every 28 (twenty-eight) days.     lisinopril-hydrochlorothiazide 20-12.5 MG per tablet  Commonly known as:  PRINZIDE,ZESTORETIC  Take 1 tablet by mouth daily.     multivitamin with minerals Tabs tablet  Take 1 tablet by mouth daily.     ondansetron 4 MG tablet  Commonly known as:  ZOFRAN  Take 1 tablet (4 mg total) by mouth every 6 (six) hours. as needed for nausea and vomiting     POTASSIMIN PO  Take 1 tablet by mouth daily.       Allergies  Allergen Reactions  . Sulfa Antibiotics Nausea Only      The results of significant diagnostics from this hospitalization (including imaging, microbiology, ancillary and laboratory) are listed below for reference.    Significant Diagnostic Studies: Ct Abdomen Pelvis Wo Contrast  12/12/2013   CLINICAL DATA:  Abdominal pain.  Not feeling well.  EXAM: CT ABDOMEN AND PELVIS WITHOUT CONTRAST  TECHNIQUE: Multidetector CT imaging of the abdomen and pelvis was performed following the standard protocol without IV contrast.  COMPARISON:  CT abdomen and pelvis with contrast 08/07/2013.  FINDINGS: Oral contrast was given for the examination. No IV contrast was administered.  Moderate-sized hiatal hernia. Clear lung bases. Normal heart size. No pleural or pericardial effusion.  Unenhanced appearance of the liver, spleen, pancreas, gallbladder, adrenal glands, and kidneys is unremarkable. No renal calculi or hydronephrosis. There is moderate stool burden consistent with constipation. Vascular calcification of the aorta iliac system without aneurysmal dilatation. Moderate degenerative change of the lumbar spine, and SI joints. Moderate bladder distention. Prostatomegaly. Penile prosthesis.  Compared with priors, a similar appearance is noted.  IMPRESSION: Moderate-sized hiatal hernia does not appear incarcerated or obstructive in nature. Constipation.  Moderate bladder distention. No acute intra-abdominal or pelvic findings. No lumbar compression deformity.   Electronically Signed   By: Rolla Flatten M.D.   On: 12/12/2013 00:07   Ct Head Wo Contrast  12/11/2013   CLINICAL DATA:  Repetitive falls  EXAM: CT HEAD WITHOUT CONTRAST  TECHNIQUE: Contiguous axial images were obtained from the base of the skull through the vertex without intravenous contrast.  COMPARISON:  12/06/2012  FINDINGS: The bony calvarium is intact. No gross soft tissue abnormality is noted. Mild atrophic changes are noted. No findings to suggest acute hemorrhage, acute infarction or space-occupying mass lesion are noted.  IMPRESSION: Chronic changes without acute abnormality.   Electronically Signed   By: Inez Catalina M.D.   On: 12/11/2013 19:49   Mr Brain Wo Contrast  12/12/2013   CLINICAL DATA:  Numerous episodes of falling.  EXAM: MRI HEAD WITHOUT CONTRAST  TECHNIQUE: Multiplanar, multiecho pulse sequences of the brain and surrounding structures were obtained without intravenous contrast.  COMPARISON:  Head CT 12/11/2013  FINDINGS: There is no abnormality of the brainstem or cerebellum. The cerebral hemispheres show abnormal T2 signal in the deep white matter consistent with  chronic small vessel change. There is an acute punctate white matter infarction at the left posterior temporal occipital junction region. No large vessel territory infarction. No mass lesion, hemorrhage, hydrocephalus or extra-axial collection. No pituitary mass. No fluid in the sinuses.  IMPRESSION: Punctate acute infarction in the white matter of the left posterior temporal occipital junction region. No swelling or hemorrhage.  Chronic small vessel changes elsewhere affecting the cerebral hemispheric white matter.   Electronically Signed   By: Nelson Chimes M.D.   On: 12/12/2013 09:54    Microbiology: No results found for this or any previous visit (from the past 240 hour(s)).   Labs: Basic Metabolic  Panel:  Recent Labs Lab 12/11/13 1847 12/12/13 0155 12/12/13 0648  NA 130* 134* 136*  K 3.8 3.5* 3.4*  CL 93* 97 98  CO2 15* 21 25  GLUCOSE 104* 110* 99  BUN 51* 34* 29*  CREATININE 1.96* 1.41* 1.34  CALCIUM 9.2 9.0 8.9  MG  --   --  2.1   Liver Function Tests:  Recent Labs Lab 12/11/13 1847 12/12/13 0155 12/12/13 0648  AST 26 18 21   ALT 17 14 15   ALKPHOS 81 70 63  BILITOT 0.6 1.0 1.2  PROT 7.7 7.1 6.8  ALBUMIN 4.0 3.8 3.6    Recent Labs Lab 12/11/13 1847  LIPASE 46    Recent Labs Lab 12/12/13 0155  AMMONIA 64*   CBC:  Recent Labs Lab 12/11/13 1847 12/12/13 0155 12/12/13 0648  WBC 13.9* 11.5* 10.5  NEUTROABS 10.9*  --   --   HGB 16.5 15.2 14.5  HCT 43.5 42.6 39.8  MCV 83.5 85.9 84.3  PLT 206 228 213   Cardiac Enzymes:  Recent Labs Lab 12/11/13 1847 12/11/13 1950 12/12/13 0155  CKTOTAL 245*  --  184  TROPONINI  --  <0.30 <0.30   BNP: BNP (last 3 results) No results found for this basename: PROBNP,  in the last 8760 hours CBG: No results found for this basename: GLUCAP,  in the last 168 hours     Signed:  Gilmore List  [facility] 12/12/2013, 12:22 PM

## 2013-12-12 NOTE — H&P (Addendum)
Hospitalist Admission History and Physical  Patient name: Gerald Nelson DOCTOR Medical record number: [id number] Date of birth: Dec 09, 1950 Age: 63 y.o. Gender: male  Primary Care Provider: [name], MD  Chief Complaint: abd pain, falls  HPI: this is a 63 year old male with a prior history of hypertension, schizophrenia, hiatal hernia presenting with recurrent abdominal pain and falls. Patient states that he's had recurrent abdominal pain for the past 2-3 months. Was seen for symptoms about 2-3 days ago here in the [facility] at that time included chest x-ray, head CT,  and aortic ultrasound that were within normal limits. Patient was given Zofran and Protonix with some symptomatic improvement. Patient states he stopped taking medication at 2 days and symptoms seem to recur. Abdominal pain is predominately epigastric though does have some generalized qualities. No radiation. Sensations of a dull ache. Positive associated nausea. One to 2 episodes of nonbilious nonbloody emesis. Patient denies any alcohol abuse, though does report alcohol use in the remote past. One pack per day smoker. Denies polysubstance abuse. Patient also reports to 3 episodes of falls at home. Denies any prodrome he does believe his head. Does not have recollection of falls, just tremors went up on the floor. Patient presents to the ER today with symptoms. Hemodynamically stable on presentation. Afebrile.note a white blood cell count 13.9., Hgb 14.5,  Platelets 206.sodium 130, K 3.4 BUN 51, creatinine 1.6, bicarbonate of 15, anion gap of 22. Lipase and lactate within normal limits. Troponins negative x1. CK level 735. UA non-indicative of infection.head CT within normal limits.CT of the abdomen shows moderate size hernia without incarceration, constipation moderate bladder distention. Otherwise no acute findings.  Assessment and Plan:  63 year old male presenting with abdominal pain, acute kidney injury, hyponatremia, anion gap  metabolic acidosis, syncope  Abdominal Pain: differential for symptoms though with epigastric predominance. high concern for gastritis/peptic ulcer disease given heavy tobacco abuse.Continue with PPI high dose. Avoid offending agents like NSAIDs. Consider GI consult as this is been a recurrent issue. Continue to follow closely. Zofran for nausea.  Acute kidney injury: Suspect prerenal etiology in the setting of dehydration.noted BUN to creatinine ratio of greater than 20:1. Also with elevated CK level in setting of recurrent falls.  Hydrate pt and reassess. Trend CK.   Hyponatremia: Noted sodium of 130 in the setting of dehydration and concomitant ACE inhibitor use. Check serum osmolality as well as urine sodium. Gently hydrate patient and reassess. Goal of correction of sodium of less than 10 and 12 mEq over 24 hours.  Anion gap metabolic acidosis: Suspect this is likely secondary to dehydration and decreased appetite. Lactate within normal limits. Alcohol level pending. Will hydrate patient and reassess.   Syncope: broad differential for symptoms including neurocardiogenic sources. Notify history of this in the past in the setting of dehydration and prolonged QT syndrome. Unable to view EKG at this time with epic down time.placed in telemetry bed. Trend CK level. Check MRI of the brain without contrast. 2-D echocardiogram. Hydrate patient. orthostatics. Reassess in the morning.  Leukocytosis: Noted WBC 13.9 on admission. No overt signs of infection on presentation. Afebrile. May be reactive in setting of recent falls and dehydration. Pan culture. Broad spectrum abx if pt spikes fever. Will trend.   Psych: baseline schizophrenia on haldol. No acute agitation, though mentation seems to be slowed/altered in setting of current presentation. Unclear if this is pt's baseline.  Check ammonia and ETOH level (denies current ETOH abuse). Continue haldol.   FEN/GI: heart healthy diet.  PPx:  sub q  heparin Dispo: pending further evaluation Code status: Full Code    Patient Active Problem List   Diagnosis Date Noted  . Hypotension 12/06/2012  . Prolonged QT interval 12/06/2012  . Leukocytosis 12/06/2012  . Hypokalemia 12/06/2012  . Hyponatremia 12/06/2012  . AKI (acute kidney injury) 12/06/2012  . Hiatal hernia 12/06/2012  . Syncope 12/06/2012  . Schizophrenia    Past Medical History: Past Medical History  Diagnosis Date  . Hypertension   . Schizophrenia   . Hernia, hiatal     Past Surgical History: Past Surgical History  Procedure Laterality Date  . Penile prosthesis implant      Social History: History   Social History  . Marital Status: Single    Spouse Name: N/A    Number of Children: N/A  . Years of Education: N/A   Social History Main Topics  . Smoking status: Current Every Day Smoker -- 1.50 packs/day    Types: Cigarettes  . Smokeless tobacco: None  . Alcohol Use: No  . Drug Use: No  . Sexual Activity: None   Other Topics Concern  . None   Social History Narrative  . None    Family History: History reviewed. No pertinent family history.  Allergies: Allergies  Allergen Reactions  . Sulfa Antibiotics Nausea Only    Current Facility-Administered Medications  Medication Dose Route Frequency Provider Last Rate Last Dose  . 0.9 %  sodium chloride infusion   Intravenous Continuous Shanda Howells, MD 100 mL/hr at 12/12/13 0200    . cloNIDine (CATAPRES) tablet 0.2 mg  0.2 mg Oral BID Shanda Howells, MD      . diltiazem (CARDIZEM CD) 24 hr capsule 240 mg  240 mg Oral Daily Shanda Howells, MD      . haloperidol decanoate (HALDOL DECANOATE) 100 MG/ML injection 50 mg  50 mg Intramuscular Q28 days Shanda Howells, MD      . heparin injection 5,000 Units  5,000 Units Subcutaneous 3 times per day Shanda Howells, MD   5,000 Units at 12/12/13 0547  . ondansetron (ZOFRAN) 8 mg/NS 50 ml IVPB  8 mg Intravenous Q6H PRN Shanda Howells, MD       Or  . ondansetron  [facility]) tablet 8 mg  8 mg Oral Q6H PRN Shanda Howells, MD      . pantoprazole (PROTONIX) injection 40 mg  40 mg Intravenous Q12H Shanda Howells, MD   40 mg at 12/12/13 0200  . polyethylene glycol (MIRALAX / GLYCOLAX) packet 17 g  17 g Oral BID Shanda Howells, MD       Review Of Systems: 12 point ROS negative except as noted above in HPI.  Physical Exam: Filed Vitals:   12/12/13 0645  BP:   Pulse: 85  Temp: 97.7 F (36.5 C)  Resp: 18   GEN: in bed, dissheveled appearing.  HEENT: poor dentition, no scleral icterus  CV: RRR, no murmurs auscultated  PULM: faint wheezes diffusely ABD: + mild epigastric TTP, mild generalized abdominal discomfort EXT: 2+ peripheral pulses, no edema  Neuro: no focal neurological deficits noted    Labs and Imaging: Lab Results  Component Value Date/Time   NA 136* 12/12/2013  6:48 AM   K 3.4* 12/12/2013  6:48 AM   CL 98 12/12/2013  6:48 AM   CO2 25 12/12/2013  6:48 AM   BUN 29* 12/12/2013  6:48 AM   CREATININE 1.34 12/12/2013  6:48 AM   GLUCOSE 99 12/12/2013  6:48 AM   Lab Results  Component Value Date   WBC 10.5 12/12/2013   HGB 14.5 12/12/2013   HCT 39.8 12/12/2013   MCV 84.3 12/12/2013   PLT 213 12/12/2013   Urinalysis    Component Value Date/Time   COLORURINE YELLOW 12/11/2013 1733   APPEARANCEUR CLEAR 12/11/2013 1733   LABSPEC 1.014 12/11/2013 1733   PHURINE 5.5 12/11/2013 1733   GLUCOSEU NEGATIVE 12/11/2013 1733   HGBUR NEGATIVE 12/11/2013 1733   BILIRUBINUR NEGATIVE 12/11/2013 1733   KETONESUR NEGATIVE 12/11/2013 1733   PROTEINUR NEGATIVE 12/11/2013 1733   UROBILINOGEN 1.0 12/11/2013 1733   NITRITE NEGATIVE 12/11/2013 1733   LEUKOCYTESUR NEGATIVE 12/11/2013 1733      Ct Abdomen Pelvis Wo Contrast  12/12/2013   CLINICAL DATA:  Abdominal pain.  Not feeling well.  EXAM: CT ABDOMEN AND PELVIS WITHOUT CONTRAST  TECHNIQUE: Multidetector CT imaging of the abdomen and pelvis was performed following the standard protocol without IV contrast.  COMPARISON:  CT abdomen and  pelvis with contrast 08/07/2013.  FINDINGS: Oral contrast was given for the examination. No IV contrast was administered.  Moderate-sized hiatal hernia. Clear lung bases. Normal heart size. No pleural or pericardial effusion.  Unenhanced appearance of the liver, spleen, pancreas, gallbladder, adrenal glands, and kidneys is unremarkable. No renal calculi or hydronephrosis. There is moderate stool burden consistent with constipation. Vascular calcification of the aorta iliac system without aneurysmal dilatation. Moderate degenerative change of the lumbar spine, and SI joints. Moderate bladder distention. Prostatomegaly. Penile prosthesis.  Compared with priors, a similar appearance is noted.  IMPRESSION: Moderate-sized hiatal hernia does not appear incarcerated or obstructive in nature. Constipation. Moderate bladder distention. No acute intra-abdominal or pelvic findings. No lumbar compression deformity.   Electronically Signed   By: Rolla Flatten M.D.   On: 12/12/2013 00:07   Ct Head Wo Contrast  12/11/2013   CLINICAL DATA:  Repetitive falls  EXAM: CT HEAD WITHOUT CONTRAST  TECHNIQUE: Contiguous axial images were obtained from the base of the skull through the vertex without intravenous contrast.  COMPARISON:  12/06/2012  FINDINGS: The bony calvarium is intact. No gross soft tissue abnormality is noted. Mild atrophic changes are noted. No findings to suggest acute hemorrhage, acute infarction or space-occupying mass lesion are noted.  IMPRESSION: Chronic changes without acute abnormality.   Electronically Signed   By: Inez Catalina M.D.   On: 12/11/2013 19:49   Mr Brain Wo Contrast  12/12/2013   CLINICAL DATA:  Numerous episodes of falling.  EXAM: MRI HEAD WITHOUT CONTRAST  TECHNIQUE: Multiplanar, multiecho pulse sequences of the brain and surrounding structures were obtained without intravenous contrast.  COMPARISON:  Head CT 12/11/2013  FINDINGS: There is no abnormality of the brainstem or cerebellum. The  cerebral hemispheres show abnormal T2 signal in the deep white matter consistent with chronic small vessel change. There is an acute punctate white matter infarction at the left posterior temporal occipital junction region. No large vessel territory infarction. No mass lesion, hemorrhage, hydrocephalus or extra-axial collection. No pituitary mass. No fluid in the sinuses.  IMPRESSION: Punctate acute infarction in the white matter of the left posterior temporal occipital junction region. No swelling or hemorrhage.  Chronic small vessel changes elsewhere affecting the cerebral hemispheric white matter.   Electronically Signed   By: Nelson Chimes M.D.   On: 12/12/2013 09:54           Shanda Howells MD  Pager: [phone]

## 2013-12-12 NOTE — Care Management Note (Signed)
    Page 1 of 1   12/12/2013     4:59:50 PM CARE MANAGEMENT NOTE 12/12/2013  Patient:  Gerald Nelson, Gerald Nelson   Account Number:  [id number]  Date Initiated:  12/12/2013  Documentation initiated by:  Tomi Bamberger  Subjective/Objective Assessment:   dx abd pain  admit- lives in boarding home.     Action/Plan:   Anticipated DC Date:  12/12/2013   Anticipated DC Plan:  [place]  CM consult      Choice offered to / List presented to:             Status of service:  Completed, signed off Medicare Important Message given?   (If response is "NO", the following Medicare IM given date fields will be blank) Date Medicare IM given:   Date Additional Medicare IM given:    Discharge Disposition:  [place]  Per UR Regulation:  Reviewed for med. necessity/level of care/duration of stay  If discussed at [place] of Stay Meetings, dates discussed:    Comments:  12/12/13 [place], BSN [phone] patient lives in a boarding home.  Patient states he does not have any funds to pay for all the test they are ordering for him in the hospital, NCM explained to patient that his insurance should take care of the majority of this, he states he still does not have any funds to pay anything else.  Patient states he wants to speak with the MD, NCM called MD and informed her of this information. NCM also did benefits check to see what patient would need to pay, but patient decided he wanted to leave AMA .

## 2013-12-12 NOTE — ED Notes (Signed)
See downtime charting starting at [place] to 0200.

## 2013-12-13 LAB — URINE CULTURE
COLONY COUNT: NO GROWTH
Culture: NO GROWTH

## 2013-12-14 LAB — CULTURE, BLOOD (ROUTINE X 2)

## 2013-12-15 NOTE — ED Provider Notes (Signed)
Medical screening examination/treatment/procedure(s) were conducted as a shared visit with non-physician practitioner(s) and myself.  I personally evaluated the patient during the encounter.   EKG Interpretation   Date/Time:  Tuesday December 11 2013 19:11:11 EDT Ventricular Rate:  83 PR Interval:  156 QRS Duration: 108 QT Interval:  403 QTC Calculation: 473 R Axis:   52 Text Interpretation:  Age not entered, assumed to be  63 years old for  purpose of ECG interpretation Sinus rhythm Biatrial enlargement  Anteroseptal infarct, old Nonspecific T abnormalities, lateral leads T  wave changes are new compared to 2014 Confirmed by Juhi Lagrange  MD, [place]  [phone]) on 12/11/2013 7:19:34 PM       Patient with new acute kidney injury and abdominal pain. CT scan obtained and will be the addition.  Ephraim Hamburger, MD 12/15/13 [phone]

## 2013-12-18 LAB — CULTURE, BLOOD (ROUTINE X 2): Culture: NO GROWTH

## 2014-07-08 IMAGING — CT CT HEAD W/O CM
1 of 2 series · 16 of 30 positions shown, 20 images · non-contrast
Comparison: None.

CLINICAL DATA: Fall, syncope, laceration and

CT HEAD WITHOUT CONTRAST
TECHNIQUE: Contiguous axial images were obtained from the base of
the skull through the vertex without contrast.

[Series 3: recon 2: brain · axial · 0.47mm/px · z∈[+160,+302]mm · 16 of 64 slices shown, 20 images]
[im 4/64  brain]
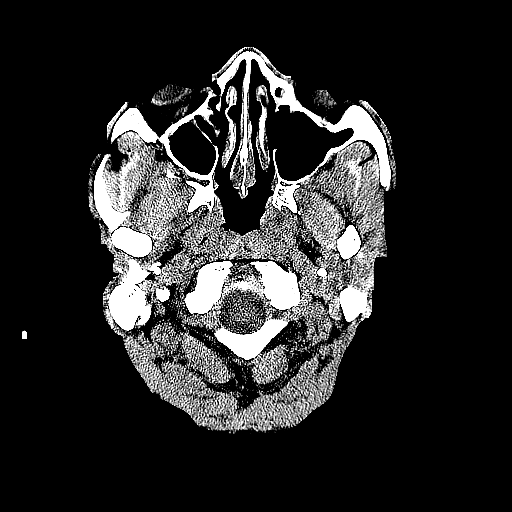
[im 4/64  bone]
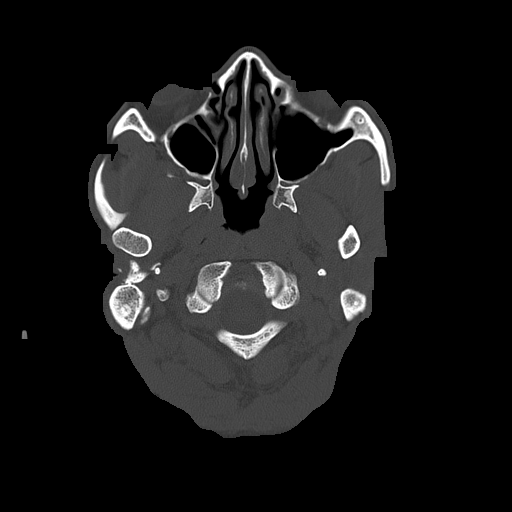
[im 7/64  brain]
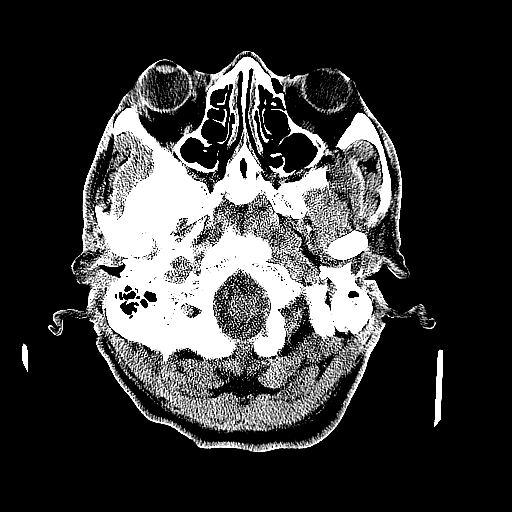
[im 10/64  brain]
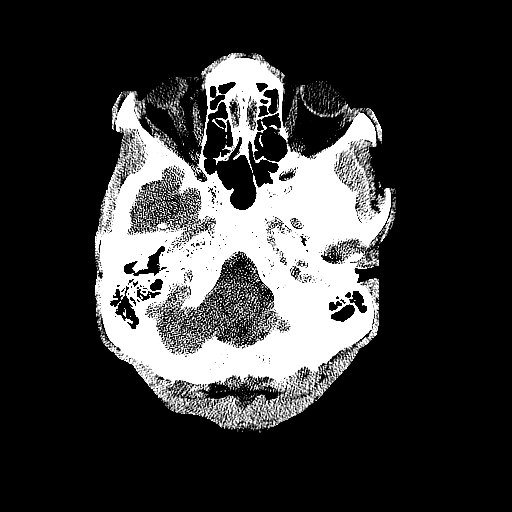
[im 14/64  brain]
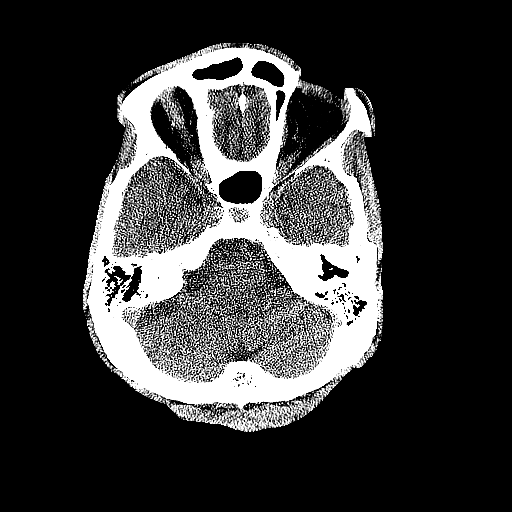
[im 20/64  brain]
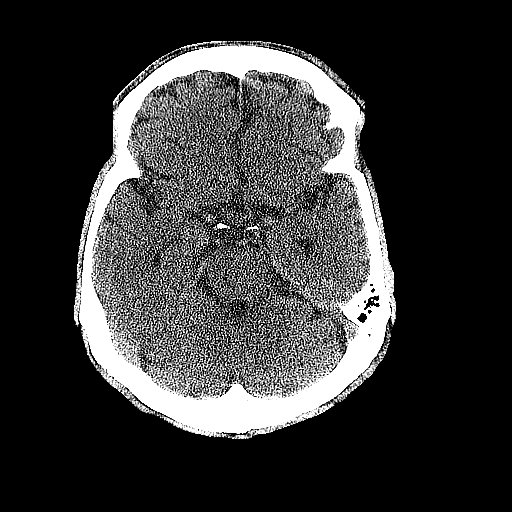
[im 20/64  bone]
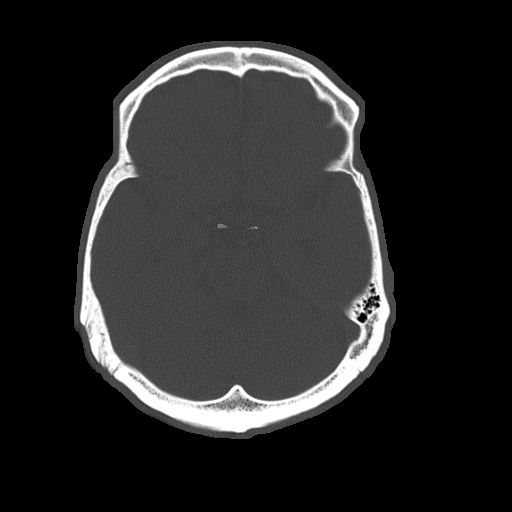
[im 24/64  brain]
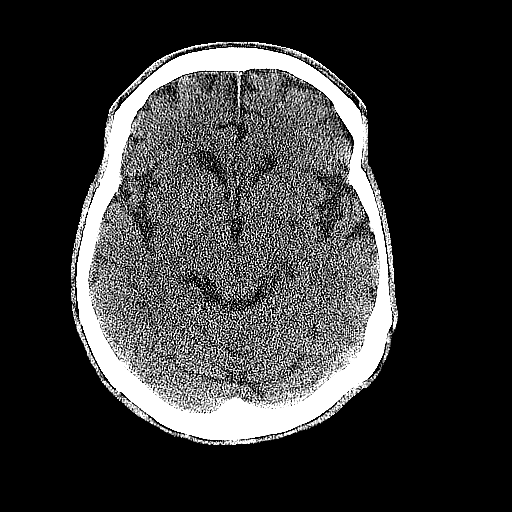
[im 27/64  brain]
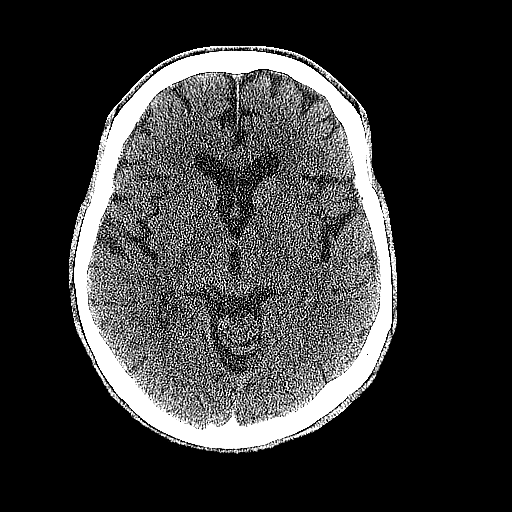
[im 30/64  brain]
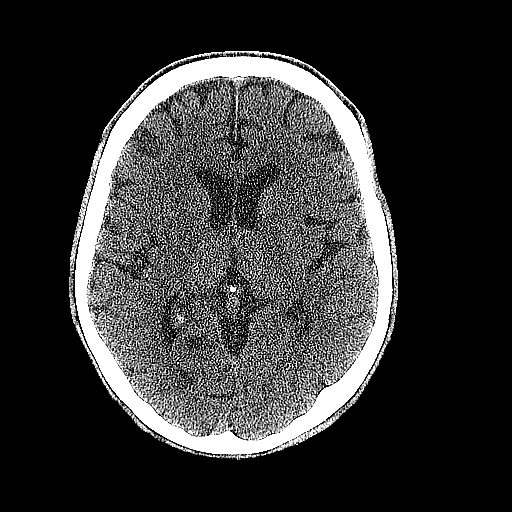
[im 34/64  brain]
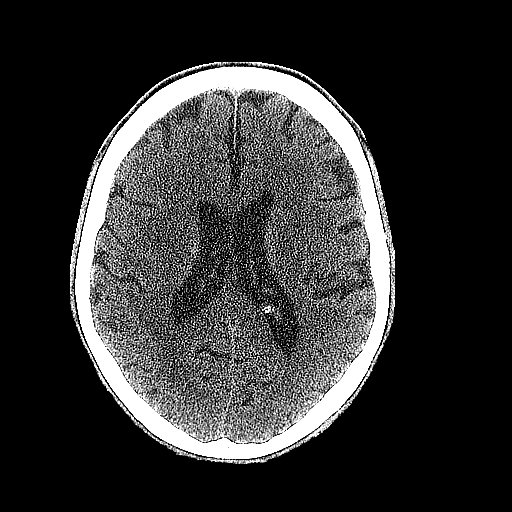
[im 34/64  bone]
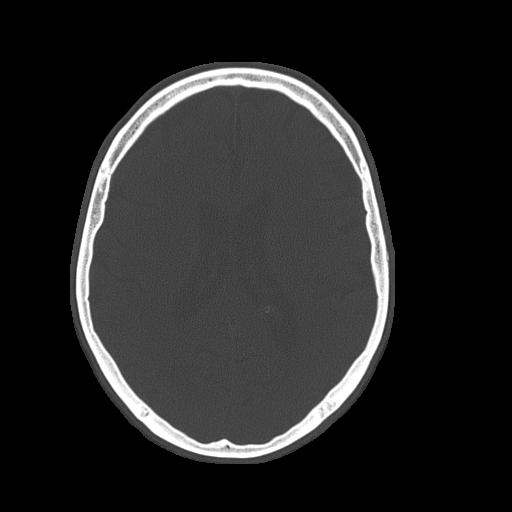
[im 37/64  brain]
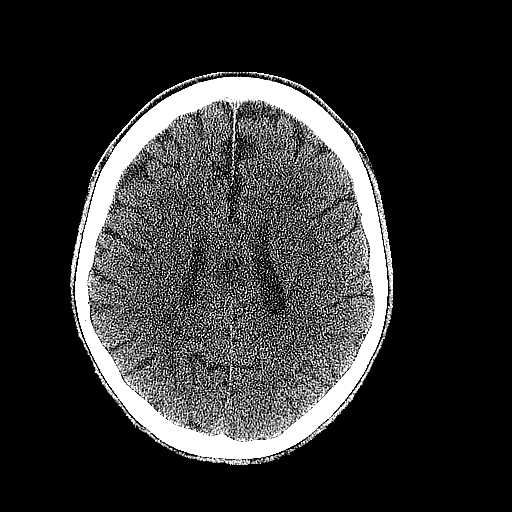
[im 40/64  brain]
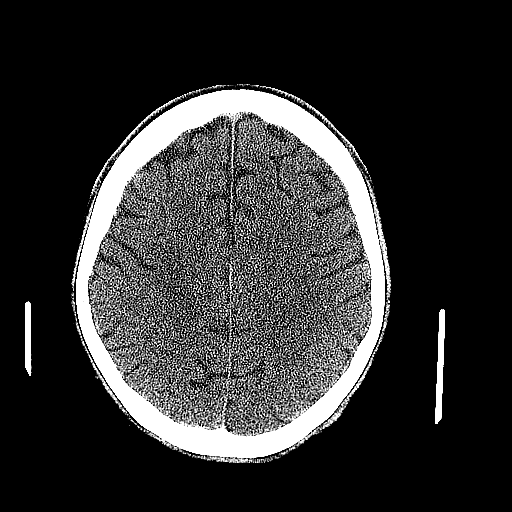
[im 44/64  brain]
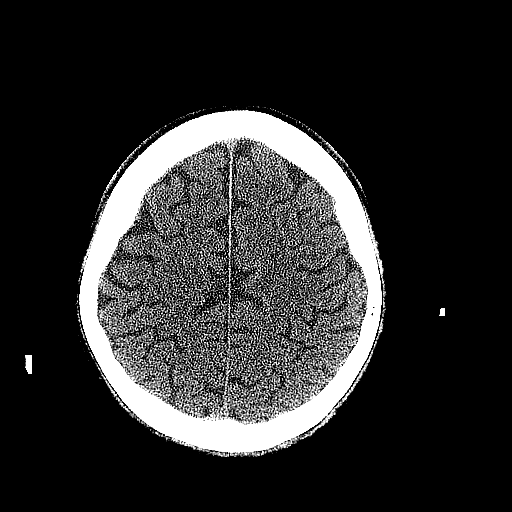
[im 50/64  brain]
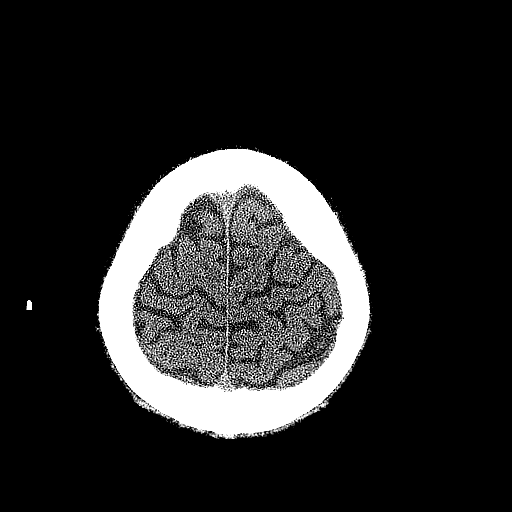
[im 50/64  bone]
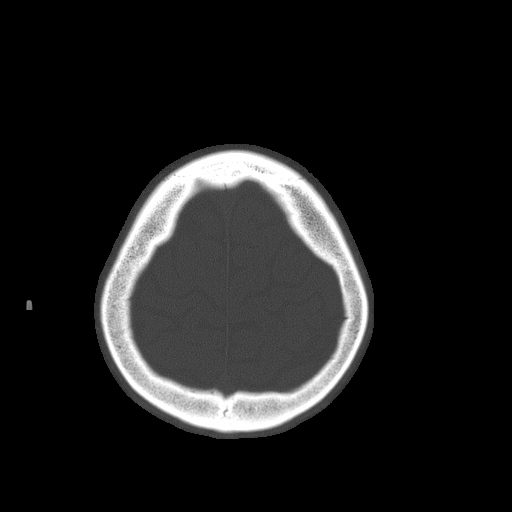
[im 54/64  brain]
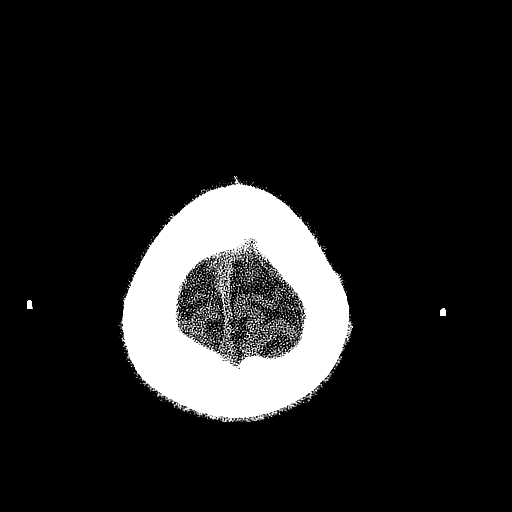
[im 57/64  brain]
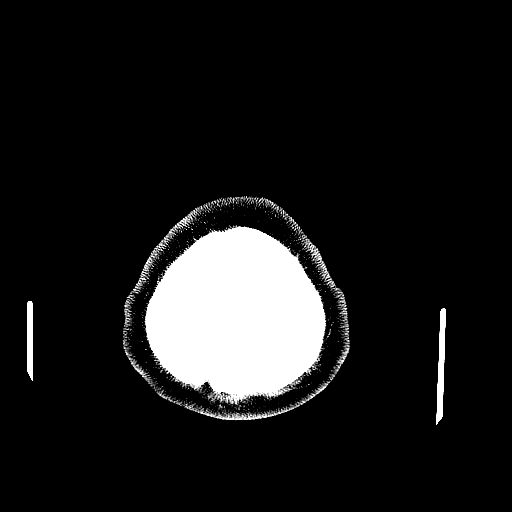
[im 60/64  brain]
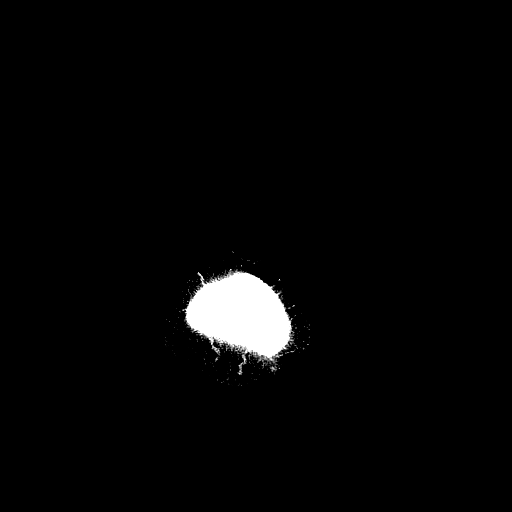

[16 of 30 positions shown; findings below may reference images not displayed]

FINDINGS: No skull fracture is noted.  Paranasal sinuses and
mastoid air cells are unremarkable.  No intracranial hemorrhage,
mass effect or midline shift.  No acute infarction.  No mass lesion
is noted on this unenhanced scan.  No intra or extra-axial fluid
collection.
IMPRESSION: No acute intracranial abnormality.

## 2017-01-03 ENCOUNTER — Encounter (HOSPITAL_COMMUNITY): Payer: Self-pay | Admitting: Emergency Medicine

## 2017-01-03 ENCOUNTER — Emergency Department (HOSPITAL_COMMUNITY): Payer: Medicare Other

## 2017-01-03 ENCOUNTER — Inpatient Hospital Stay (HOSPITAL_COMMUNITY)
Admission: EM | Admit: 2017-01-03 | Discharge: 2017-01-06 | DRG: 291 | Discharge status: Against Medical Advice | Payer: Medicare Other | Attending: Family Medicine | Admitting: Family Medicine

## 2017-01-03 ENCOUNTER — Other Ambulatory Visit: Payer: Self-pay | Admitting: Internal Medicine

## 2017-01-03 DIAGNOSIS — J449 Chronic obstructive pulmonary disease, unspecified: Secondary | ICD-10-CM | POA: Diagnosis present

## 2017-01-03 DIAGNOSIS — Z8249 Family history of ischemic heart disease and other diseases of the circulatory system: Secondary | ICD-10-CM | POA: Diagnosis not present

## 2017-01-03 DIAGNOSIS — F209 Schizophrenia, unspecified: Secondary | ICD-10-CM | POA: Diagnosis present

## 2017-01-03 DIAGNOSIS — Z79899 Other long term (current) drug therapy: Secondary | ICD-10-CM | POA: Diagnosis not present

## 2017-01-03 DIAGNOSIS — F17200 Nicotine dependence, unspecified, uncomplicated: Secondary | ICD-10-CM | POA: Diagnosis present

## 2017-01-03 DIAGNOSIS — N183 Chronic kidney disease, stage 3 unspecified: Secondary | ICD-10-CM | POA: Diagnosis present

## 2017-01-03 DIAGNOSIS — Z8673 Personal history of transient ischemic attack (TIA), and cerebral infarction without residual deficits: Secondary | ICD-10-CM

## 2017-01-03 DIAGNOSIS — I509 Heart failure, unspecified: Secondary | ICD-10-CM | POA: Diagnosis not present

## 2017-01-03 DIAGNOSIS — I5041 Acute combined systolic (congestive) and diastolic (congestive) heart failure: Secondary | ICD-10-CM

## 2017-01-03 DIAGNOSIS — I130 Hypertensive heart and chronic kidney disease with heart failure and stage 1 through stage 4 chronic kidney disease, or unspecified chronic kidney disease: Secondary | ICD-10-CM | POA: Diagnosis present

## 2017-01-03 DIAGNOSIS — R0902 Hypoxemia: Secondary | ICD-10-CM

## 2017-01-03 DIAGNOSIS — I10 Essential (primary) hypertension: Secondary | ICD-10-CM | POA: Diagnosis present

## 2017-01-03 DIAGNOSIS — I429 Cardiomyopathy, unspecified: Secondary | ICD-10-CM | POA: Diagnosis present

## 2017-01-03 DIAGNOSIS — Z882 Allergy status to sulfonamides status: Secondary | ICD-10-CM

## 2017-01-03 DIAGNOSIS — I5023 Acute on chronic systolic (congestive) heart failure: Secondary | ICD-10-CM | POA: Diagnosis present

## 2017-01-03 DIAGNOSIS — Z7902 Long term (current) use of antithrombotics/antiplatelets: Secondary | ICD-10-CM

## 2017-01-03 DIAGNOSIS — M7989 Other specified soft tissue disorders: Secondary | ICD-10-CM

## 2017-01-03 DIAGNOSIS — Z960 Presence of urogenital implants: Secondary | ICD-10-CM | POA: Diagnosis present

## 2017-01-03 DIAGNOSIS — R791 Abnormal coagulation profile: Secondary | ICD-10-CM | POA: Diagnosis present

## 2017-01-03 DIAGNOSIS — I360 Nonrheumatic tricuspid (valve) stenosis: Secondary | ICD-10-CM | POA: Diagnosis not present

## 2017-01-03 DIAGNOSIS — I5021 Acute systolic (congestive) heart failure: Secondary | ICD-10-CM | POA: Diagnosis not present

## 2017-01-03 HISTORY — DX: Chronic obstructive pulmonary disease, unspecified: J44.9

## 2017-01-03 LAB — CBC
HCT: 42.7 % (ref 39.0–52.0)
Hemoglobin: 14.8 g/dL (ref 13.0–17.0)
MCH: 31.4 pg (ref 26.0–34.0)
MCHC: 34.7 g/dL (ref 30.0–36.0)
MCV: 90.7 fL (ref 78.0–100.0)
PLATELETS: 187 10*3/uL (ref 150–400)
RBC: 4.71 MIL/uL (ref 4.22–5.81)
RDW: 13.9 % (ref 11.5–15.5)
WBC: 7.1 10*3/uL (ref 4.0–10.5)

## 2017-01-03 LAB — BASIC METABOLIC PANEL
Anion gap: 9 (ref 5–15)
BUN: 21 mg/dL — ABNORMAL HIGH (ref 6–20)
CO2: 27 mmol/L (ref 22–32)
CREATININE: 1.68 mg/dL — AB (ref 0.61–1.24)
Calcium: 9 mg/dL (ref 8.9–10.3)
Chloride: 105 mmol/L (ref 101–111)
GFR, EST AFRICAN AMERICAN: 48 mL/min — AB (ref >60)
GFR, EST NON AFRICAN AMERICAN: 41 mL/min — AB (ref >60)
Glucose, Bld: 91 mg/dL (ref 65–99)
Potassium: 3.9 mmol/L (ref 3.5–5.1)
SODIUM: 141 mmol/L (ref 135–145)

## 2017-01-03 LAB — BRAIN NATRIURETIC PEPTIDE: B Natriuretic Peptide: 1681.6 pg/mL — ABNORMAL HIGH (ref 0.0–100.0)

## 2017-01-03 MED ORDER — FUROSEMIDE 10 MG/ML IJ SOLN
40.0000 mg | Freq: Once | INTRAMUSCULAR | Status: AC
  Administered 2017-01-03: 40 mg via INTRAVENOUS
  Filled 2017-01-03: qty 4

## 2017-01-03 NOTE — ED Notes (Signed)
Received call about patient from Dr. Sherilyn Cooter office.  States today's diagnosis is CHF and sent here for a r/o PE after a d-dimer of 2.66 was noted in office.

## 2017-01-03 NOTE — ED Notes (Signed)
Pt going out to smoke prior to being called to room

## 2017-01-03 NOTE — ED Triage Notes (Signed)
Pt to ED with c/o being short of breath x's 6 months.  Pt st's he was sent to ED by his MD  Dr. Tommie Ard

## 2017-01-03 NOTE — ED Provider Notes (Signed)
[place] DEPT Provider Note   CSN: [id number] Arrival date & time: 01/03/17  1804     History   Chief Complaint Chief Complaint  Patient presents with  . Shortness of Breath    HPI LOUAY MYRIE is a 66 y.o. male.  The history is provided by the patient.  Shortness of Breath  This is a new problem. The average episode lasts 6 months. The problem occurs intermittently.The current episode started more than 1 week ago. The problem has been gradually worsening. Associated symptoms include cough and leg swelling. Pertinent negatives include no fever, no sore throat, no ear pain, no sputum production, no hemoptysis, no chest pain, no vomiting, no abdominal pain and no rash. He has tried nothing for the symptoms.    Past Medical History:  Diagnosis Date  . COPD (chronic obstructive pulmonary disease) ([place])   . Hernia, hiatal   . Hypertension   . Schizophrenia [name])     Patient Active Problem List   Diagnosis Date Noted  . Acute CHF (congestive heart failure) ([place]) 01/03/2017  . CVA (cerebral infarction) 12/12/2013  . Hypotension 12/06/2012  . Prolonged QT interval 12/06/2012  . Leukocytosis 12/06/2012  . Hypokalemia 12/06/2012  . Hyponatremia 12/06/2012  . AKI (acute kidney injury) ([place]) 12/06/2012  . Hiatal hernia 12/06/2012  . Syncope 12/06/2012  . Schizophrenia [facility])     Past Surgical History:  Procedure Laterality Date  . PENILE PROSTHESIS IMPLANT         Home Medications    Prior to Admission medications   Medication Sig Start Date End Date Taking? Authorizing Provider  cloNIDine (CATAPRES) 0.2 MG tablet Take 0.2 mg by mouth 2 (two) times daily.    Yes [provider]  clopidogrel (PLAVIX) 75 MG tablet Take 1 tablet (75 mg total) by mouth daily with breakfast. 12/12/13  Yes Hosie Poisson, MD  diltiazem (CARDIZEM CD) 240 MG 24 hr capsule Take 240 mg by mouth daily.  11/03/13  Yes [provider]  esomeprazole (NEXIUM) 20 MG packet Take 20  mg by mouth daily before breakfast. 12/05/13  Yes Triplett, Tammy, PA-C  haloperidol decanoate (HALDOL DECANOATE) 50 MG/ML injection Inject 50 mg into the muscle every 30 (thirty) days.  12/06/16  Yes [provider]  lisinopril-hydrochlorothiazide (PRINZIDE,ZESTORETIC) 20-12.5 MG per tablet Take 1 tablet by mouth daily.  11/05/13  Yes [provider]  ondansetron (ZOFRAN) 4 MG tablet Take 1 tablet (4 mg total) by mouth every 6 (six) hours. as needed for nausea and vomiting Patient not taking: Reported on 01/03/2017 12/05/13   Kem Parkinson, PA-C    Family History Family History  Problem Relation Age of Onset  . Hypertension Mother   . Hypertension Father     Social History Social History  Substance Use Topics  . Smoking status: Current Every Day Smoker    Packs/day: 1.50    Types: Cigarettes  . Smokeless tobacco: Never Used  . Alcohol use No     Allergies   Sulfa antibiotics   Review of Systems Review of Systems  Constitutional: Negative for chills and fever.  HENT: Negative for ear pain and sore throat.   Eyes: Negative for pain and visual disturbance.  Respiratory: Positive for cough and shortness of breath. Negative for hemoptysis and sputum production.   Cardiovascular: Positive for leg swelling. Negative for chest pain and palpitations.  Gastrointestinal: Negative for abdominal pain and vomiting.  Endocrine: Negative for polyuria.  Genitourinary: Negative for dysuria and hematuria.  Musculoskeletal:  Negative for arthralgias and back pain.  Skin: Negative for color change and rash.  Neurological: Negative for seizures and syncope.  Psychiatric/Behavioral: Negative for agitation and behavioral problems.  All other systems reviewed and are negative.    Physical Exam Updated Vital Signs BP (!) 143/94   Pulse 88   Temp 97.6 F (36.4 C) (Oral)   Resp (!) 23   Ht 5\' 10"  (1.778 m)   Wt 74.8 kg (165 lb)   SpO2 98%   BMI 23.68 kg/m   Physical Exam    Constitutional: He is oriented to person, place, and time. He appears well-developed and well-nourished. No distress.  HENT:  Head: Normocephalic and atraumatic.  Right Ear: External ear normal.  Left Ear: External ear normal.  Mouth/Throat: Oropharynx is clear and moist.  Eyes: Conjunctivae and EOM are normal.  Neck: Neck supple. No tracheal deviation present.  Cardiovascular: Normal rate, regular rhythm and intact distal pulses.   No murmur heard. Pulmonary/Chest: Effort normal. No respiratory distress. He has rales.  Bilateral coarse breath sounds.  Abdominal: Soft. There is no tenderness.  Musculoskeletal: He exhibits no edema or deformity.  Symmetric swelling of bilateral lower extremities.  Neurological: He is alert and oriented to person, place, and time.  Skin: Skin is warm and dry. He is not diaphoretic.  Psychiatric: He has a normal mood and affect.  Nursing note and vitals reviewed.    ED Treatments / Results  Labs (all labs ordered are listed, but only abnormal results are displayed) Labs Reviewed  BASIC METABOLIC PANEL - Abnormal; Notable for the following:       Result Value   BUN 21 (*)    Creatinine, Ser 1.68 (*)    GFR calc non Af Amer 41 (*)    GFR calc Af Amer 48 (*)    All other components within normal limits  BRAIN NATRIURETIC PEPTIDE - Abnormal; Notable for the following:    B Natriuretic Peptide 1,681.6 (*)    All other components within normal limits  CBC    EKG  EKG Interpretation None       Radiology Dg Chest 2 View  Result Date: 01/03/2017 CLINICAL DATA:  Shortness of breath for 6 months. EXAM: CHEST  2 VIEW COMPARISON:  12/06/2012 FINDINGS: Moderate cardiomegaly, new from prior exam. Mild interstitial edema. Small bilateral pleural effusions, left greater than right. There is fluid in the fissures. No confluent airspace disease. No pneumothorax. No acute osseous abnormalities. IMPRESSION: Moderate cardiomegaly with pulmonary edema and  bilateral pleural effusions, consistent with CHF. Electronically Signed   By: Jeb Levering M.D.   On: 01/03/2017 19:09    Procedures Procedures (including critical care time)  Medications Ordered in ED Medications  furosemide (LASIX) injection 40 mg (40 mg Intravenous Given 01/03/17 2231)     Initial Impression / Assessment and Plan / ED Course  I have reviewed the triage vital signs and the nursing notes.  Pertinent labs & imaging results that were available during my care of the patient were reviewed by me and considered in my medical decision making (see chart for details).     RASHI GIULIANI is a 66 year old male with history of schizophrenia, hypertension, CVA coming in today with shortness of breath. Patient states he been short of breath for several months but visited his doctor this morning got "a blood test and then told him he needed to come to the emergency department ." Per triage note, patient received D-dimer test that was  elevated. Patient denies any acute shortness of breath and denies pleuritic chest pain at this time. Patient also diagnosed with CHF this morning. States he has been waking up at night short of breath and has to sleep in a chair.  Exam he is sitting up in bed in no apparent distress. Vital signs are stable and within normal limits. Bilateral lower extremities with 1+ pitting edema. Symmetric. Coarse lung sounds bilaterally with no focality. Basic metabolic panel shows slightly elevated creatinine at 1.68, otherwise unremarkable. No leukocytosis. Chest x-ray shows moderate cardiomegaly with pulmonary edema and bilateral pleural effusions, consistent with CHF. BNP 1681. Patient satting mid 90s on room air however after I walked about 30 yards with him, he became more short of breath and desatted into the mid 80s. Patient will be admitted to hospitalist service for further evaluation and management. Stable while under my care.  Patient was seen with my  attending, Dr. Venora Maples, who voiced agreement and oversaw the evaluation and treatment of this patient.   [employer] seismologist was used in the creation of this note. If there are any errors or inconsistencies needing clarification, please contact me directly.   Final Clinical Impressions(s) / ED Diagnoses   Final diagnoses:  Hypoxia    New Prescriptions New Prescriptions   No medications on file     Valda Lamb, MD 01/03/17 Kennedy Bucker    Jola Schmidt, MD 01/04/17 0100

## 2017-01-04 ENCOUNTER — Encounter (HOSPITAL_COMMUNITY): Payer: Self-pay | Admitting: Family Medicine

## 2017-01-04 ENCOUNTER — Inpatient Hospital Stay (HOSPITAL_COMMUNITY): Payer: Medicare Other

## 2017-01-04 DIAGNOSIS — I509 Heart failure, unspecified: Secondary | ICD-10-CM

## 2017-01-04 DIAGNOSIS — N183 Chronic kidney disease, stage 3 unspecified: Secondary | ICD-10-CM | POA: Diagnosis present

## 2017-01-04 DIAGNOSIS — Z720 Tobacco use: Secondary | ICD-10-CM

## 2017-01-04 DIAGNOSIS — I10 Essential (primary) hypertension: Secondary | ICD-10-CM | POA: Diagnosis present

## 2017-01-04 DIAGNOSIS — I360 Nonrheumatic tricuspid (valve) stenosis: Secondary | ICD-10-CM

## 2017-01-04 LAB — HIV ANTIBODY (ROUTINE TESTING W REFLEX): HIV SCREEN 4TH GENERATION: NONREACTIVE

## 2017-01-04 LAB — ECHOCARDIOGRAM COMPLETE
HEIGHTINCHES: 70 in
Weight: 2502.4 oz

## 2017-01-04 LAB — TSH: TSH: 3.251 u[IU]/mL (ref 0.350–4.500)

## 2017-01-04 LAB — TROPONIN I
TROPONIN I: 0.09 ng/mL — AB (ref <0.03)
TROPONIN I: 0.08 ng/mL — AB (ref <0.03)

## 2017-01-04 MED ORDER — CLOPIDOGREL BISULFATE 75 MG PO TABS
75.0000 mg | ORAL_TABLET | Freq: Every day | ORAL | Status: DC
  Administered 2017-01-04 – 2017-01-05 (×2): 75 mg via ORAL
  Filled 2017-01-04 (×4): qty 1

## 2017-01-04 MED ORDER — FUROSEMIDE 10 MG/ML IJ SOLN
40.0000 mg | Freq: Two times a day (BID) | INTRAMUSCULAR | Status: DC
  Filled 2017-01-04: qty 4

## 2017-01-04 MED ORDER — HYDRALAZINE HCL 20 MG/ML IJ SOLN
5.0000 mg | Freq: Once | INTRAMUSCULAR | Status: AC
  Administered 2017-01-04: 5 mg via INTRAVENOUS
  Filled 2017-01-04: qty 1

## 2017-01-04 MED ORDER — HYDRALAZINE HCL 20 MG/ML IJ SOLN: 10.0000 mg | Freq: Once | INTRAMUSCULAR | Status: DC

## 2017-01-04 MED ORDER — ACETAMINOPHEN 325 MG PO TABS: 650.0000 mg | ORAL_TABLET | Freq: Four times a day (QID) | ORAL | Status: DC | PRN

## 2017-01-04 MED ORDER — ENOXAPARIN SODIUM 40 MG/0.4ML ~~LOC~~ SOLN
40.0000 mg | SUBCUTANEOUS | Status: DC
  Administered 2017-01-04 – 2017-01-05 (×2): 40 mg via SUBCUTANEOUS
  Filled 2017-01-04 (×2): qty 0.4

## 2017-01-04 MED ORDER — POTASSIUM CHLORIDE CRYS ER 20 MEQ PO TBCR
40.0000 meq | EXTENDED_RELEASE_TABLET | Freq: Two times a day (BID) | ORAL | Status: DC
  Administered 2017-01-04 – 2017-01-05 (×5): 40 meq via ORAL
  Filled 2017-01-04 (×5): qty 2

## 2017-01-04 MED ORDER — SODIUM CHLORIDE 0.9% FLUSH
3.0000 mL | Freq: Two times a day (BID) | INTRAVENOUS | Status: DC
  Administered 2017-01-04 – 2017-01-05 (×5): 3 mL via INTRAVENOUS

## 2017-01-04 MED ORDER — ONDANSETRON HCL 4 MG/2ML IJ SOLN: 4.0000 mg | Freq: Four times a day (QID) | INTRAMUSCULAR | Status: DC | PRN

## 2017-01-04 MED ORDER — ESOMEPRAZOLE MAGNESIUM 20 MG PO PACK: 20.0000 mg | PACK | Freq: Every day | ORAL | Status: DC

## 2017-01-04 MED ORDER — NICOTINE 21 MG/24HR TD PT24
21.0000 mg | MEDICATED_PATCH | Freq: Every day | TRANSDERMAL | Status: DC
Start: 2017-01-04 — End: 2017-01-06
  Administered 2017-01-04 – 2017-01-05 (×2): 21 mg via TRANSDERMAL
  Filled 2017-01-04 (×2): qty 1

## 2017-01-04 MED ORDER — LISINOPRIL 20 MG PO TABS
20.0000 mg | ORAL_TABLET | Freq: Every day | ORAL | Status: DC
  Administered 2017-01-04 – 2017-01-05 (×2): 20 mg via ORAL
  Filled 2017-01-04 (×2): qty 1

## 2017-01-04 MED ORDER — CARVEDILOL 6.25 MG PO TABS
6.2500 mg | ORAL_TABLET | Freq: Two times a day (BID) | ORAL | Status: DC
  Administered 2017-01-04 – 2017-01-05 (×2): 6.25 mg via ORAL
  Filled 2017-01-04 (×2): qty 1

## 2017-01-04 MED ORDER — HYDRALAZINE HCL 25 MG PO TABS: 25.0000 mg | ORAL_TABLET | Freq: Three times a day (TID) | ORAL | Status: DC

## 2017-01-04 MED ORDER — IPRATROPIUM-ALBUTEROL 0.5-2.5 (3) MG/3ML IN SOLN
3.0000 mL | Freq: Four times a day (QID) | RESPIRATORY_TRACT | Status: DC | PRN
  Administered 2017-01-04 – 2017-01-05 (×2): 3 mL via RESPIRATORY_TRACT
  Filled 2017-01-04 (×2): qty 3

## 2017-01-04 MED ORDER — PERFLUTREN LIPID MICROSPHERE
INTRAVENOUS | Status: AC
  Administered 2017-01-04: 2 mL via INTRAVENOUS
  Filled 2017-01-04: qty 10

## 2017-01-04 MED ORDER — PANTOPRAZOLE SODIUM 40 MG PO TBEC
80.0000 mg | DELAYED_RELEASE_TABLET | Freq: Every day | ORAL | Status: DC
  Administered 2017-01-04 – 2017-01-05 (×2): 80 mg via ORAL
  Filled 2017-01-04 (×4): qty 2

## 2017-01-04 MED ORDER — PERFLUTREN LIPID MICROSPHERE
1.0000 mL | INTRAVENOUS | Status: AC | PRN
  Administered 2017-01-04: 2 mL via INTRAVENOUS
  Filled 2017-01-04: qty 10

## 2017-01-04 MED ORDER — FUROSEMIDE 10 MG/ML IJ SOLN
60.0000 mg | Freq: Two times a day (BID) | INTRAMUSCULAR | Status: DC
  Administered 2017-01-04 – 2017-01-05 (×2): 60 mg via INTRAVENOUS
  Filled 2017-01-04 (×2): qty 6

## 2017-01-04 MED ORDER — HYDRALAZINE HCL 25 MG PO TABS
25.0000 mg | ORAL_TABLET | Freq: Three times a day (TID) | ORAL | Status: DC
  Administered 2017-01-04 – 2017-01-06 (×7): 25 mg via ORAL
  Filled 2017-01-04 (×7): qty 1

## 2017-01-04 NOTE — Progress Notes (Signed)
Patient walked up and down the hall, even with increased work of breathing. Said he felt claustrophobic.

## 2017-01-04 NOTE — Progress Notes (Signed)
BP high, NP notified in request for PRN with parameters.

## 2017-01-04 NOTE — Progress Notes (Signed)
Patient seen and examined. Admitted after midnight secondary to SOB, LE edema and orthopnea. Patient had BNP in 1600 range and CXR demonstrating vascular congestion and pleural effusions. Patient is CP free, but is SOB and with exp wheezing. He will like a nicotine patch. Overall hemodynamically stable. Please refer to H&P written by Dr. Loleta Books for further info/details on admission.   Plan: acute CHF ([place]onwn type currently) -will check 2-D echo -daily weight and strict I's and O's -encourage to follow low sodium diet  -will continue IV lasix -continue hydralazine, lisinopril and low dose Coreg -will add flutter valve, nicotine patch and PRN duoneb to his therapy approach. -follow clinical response    Barton Dubois [phone]

## 2017-01-04 NOTE — Progress Notes (Signed)
  Echocardiogram 2D Echocardiogram has been performed.  Gerald Nelson 01/04/2017, 11:36 AM

## 2017-01-04 NOTE — Care Management Note (Signed)
Case Management Note  Patient Details  Name: Gerald Nelson MRN: [id number] Date of Birth: Nov 03, 1950  Subjective/Objective:    Admitted with Acute CHF              Action/Plan: Patient lives in a Rooming/ [place]; PCP: [name], MD; no medical insurance; Financial Counselor to see pt to determine what he qualifies for; CM will continue to follow for DCP  Expected Discharge Date:  01/06/17               Expected Discharge Plan:  Home/Self Care  In-House Referral:  Financial Counselor  Discharge planning Services  CM Consult  Status of Service:  In process, will continue to follow  Sherrilyn Rist [phone] 01/04/2017, 10:25 AM

## 2017-01-04 NOTE — Progress Notes (Signed)
Troponin 0.09. NP notified. Patient denies chest pain.

## 2017-01-04 NOTE — Progress Notes (Signed)
Client was educated that it is important to leave the oxygen consistantly on instead of taking it on/off. He was notified that it will help him to breath better and help assist him with shortness of breath.  [place] Student Nurse

## 2017-01-04 NOTE — H&P (Addendum)
History and Physical  Patient Name: Gerald Nelson     QMG:[id number]    DOB: Sep 14, 1950    DOA: 01/03/2017 PCP: [name], MD   Patient coming from: Dr. Glenna Durand office  Chief Complaint: Dyspnea on exertion  HPI: Gerald Nelson is a 66 y.o. male with a past medical history significant for schizophrenia and HTN and hx of small CVA who presents with few weeks progressive dyspnea on exertion.  The patient was in his usual state of health until a few weeks ago when he started to develop dyspnea on exertion, better with rest. Over the next several weeks, this became progressively worse, until this week he could barely breathe or walk across the room. He also had severe orthopnea, relieved with sitting up, bilateral LE edema to the groin, new dry cough, nausea and decreased appetite.  Today, he went to Dr. Glenna Durand office, who diagnosed CHF and he was sent to the ER for a POC d-dimer >2.  He has no history of prior DVT, PE, cancer.  No recent surgery or immobilization.  No unilateral leg swelling, no chest pain.  ED course: -Afebrile, heart rate 104, respirations 22, blood pressure 150/98, pulse oximetry 97% at rest -Na 141, K 3.9, Cr 1.68 (baseline 1.3 three years ago), WBC 7.1K, Hgb 14.8 -BNP 1681 -CXR shwoed bilateral edema, bilateral small effusions -ECG showed nonspecific changes -His O2 sat dropped to 85% with ambulating a short distance -He was given furosemide 40 IV and put out 750 cc urine and TRH were asked to admit for CHF     ROS: Review of Systems  Constitutional: Negative for chills, fever and malaise/fatigue.  Respiratory: Positive for cough and shortness of breath. Negative for sputum production.   Cardiovascular: Positive for orthopnea and leg swelling. Negative for chest pain and palpitations.  Gastrointestinal: Positive for nausea. Negative for abdominal pain, blood in stool, constipation, diarrhea, melena and vomiting.  All other systems reviewed and are negative.          Past Medical History:  Diagnosis Date  . COPD (chronic obstructive pulmonary disease) ([place])   . Hernia, hiatal   . Hypertension   . Schizophrenia [facility])     Past Surgical History:  Procedure Laterality Date  . PENILE PROSTHESIS IMPLANT      Social History: Patient lives alone in a rooming house.  The patient walks unassisted.  He smokes.  Does not use alcohol or illegal drugs.  From [place].    Allergies  Allergen Reactions  . Sulfa Antibiotics Nausea Only    Family history: family history includes Hypertension in his father and mother.  Prior to Admission medications   Medication Sig Start Date End Date Taking? Authorizing Provider  cloNIDine (CATAPRES) 0.2 MG tablet Take 0.2 mg by mouth 2 (two) times daily.    Yes [provider]  clopidogrel (PLAVIX) 75 MG tablet Take 1 tablet (75 mg total) by mouth daily with breakfast. 12/12/13  Yes Hosie Poisson, MD  diltiazem (CARDIZEM CD) 240 MG 24 hr capsule Take 240 mg by mouth daily.  11/03/13  Yes [provider]  esomeprazole (NEXIUM) 20 MG packet Take 20 mg by mouth daily before breakfast. 12/05/13  Yes Triplett, Tammy, PA-C  haloperidol decanoate (HALDOL DECANOATE) 50 MG/ML injection Inject 50 mg into the muscle every 30 (thirty) days.  12/06/16  Yes [provider]  lisinopril-hydrochlorothiazide (PRINZIDE,ZESTORETIC) 20-12.5 MG per tablet Take 1 tablet by mouth daily.  11/05/13  Yes [provider]  Physical Exam: BP (!) 143/94   Pulse 88   Temp 97.6 F (36.4 C) (Oral)   Resp (!) 23   Ht 5\' 10"  (1.778 m)   Wt 74.8 kg (165 lb)   SpO2 98%   BMI 23.68 kg/m  General appearance: Well-developed, adult male, alert and in mild distress from dyspnea.   Eyes: Anicteric, conjunctiva muddy, lids and lashes normal. PERRL.    ENT: No nasal deformity, discharge, epistaxis.  Hearing normal. OP tacky dry without lesions.   Neck: No neck masses.  Trachea midline.  No  thyromegaly/tenderness. Lymph: No cervical or supraclavicular lymphadenopathy. Skin: Warm and dry.   No suspicious rashes or lesions, small subq nodule/cyst on posterior neck.  Scabs on left shin, looks excoriated. Cardiac: RRR, nl S1-S2, no murmurs appreciated.  Capillary refill is brisk.  JVP elevated, EJs distended.  3+ LE edema to buttocks.  Radial and DP pulses 2+ and symmetric. Respiratory: Normal respiratory rate and rhythm.  Cardiac wheeze present.  Noted rales at bases. Abdomen: Abdomen soft.  No TTP. No ascites, distension, hepatosplenomegaly.   MSK: No deformities or effusions.  No cyanosis or clubbing. Neuro: Cranial nerves 3-12 intact other than tongue deviates to right.  Sensation intact to light touch. Speech is somewhat hard to understand at baseline, normal per patient.  Muscle strength 5/5 and ysmmetric.    Psych: Sensorium intact and responding to questions, attention normal.  Behavior appropriate.  Affect normal.  Judgment and insight appear normal.     Labs on Admission:  I have personally reviewed following labs and imaging studies: CBC:  Recent Labs Lab 01/03/17 1846  WBC 7.1  HGB 14.8  HCT 42.7  MCV 90.7  PLT 458   Basic Metabolic Panel:  Recent Labs Lab 01/03/17 1846  NA 141  K 3.9  CL 105  CO2 27  GLUCOSE 91  BUN 21*  CREATININE 1.68*  CALCIUM 9.0   GFR: Estimated Creatinine Clearance: 45.9 mL/min (A) (by C-G formula based on SCr of 1.68 mg/dL (H)).        Radiological Exams on Admission: Personally reviewed CXR shows edema and bilateral effusions: Dg Chest 2 View  Result Date: 01/03/2017 CLINICAL DATA:  Shortness of breath for 6 months. EXAM: CHEST  2 VIEW COMPARISON:  12/06/2012 FINDINGS: Moderate cardiomegaly, new from prior exam. Mild interstitial edema. Small bilateral pleural effusions, left greater than right. There is fluid in the fissures. No confluent airspace disease. No pneumothorax. No acute osseous abnormalities. IMPRESSION:  Moderate cardiomegaly with pulmonary edema and bilateral pleural effusions, consistent with CHF. Electronically Signed   By: Jeb Levering M.D.   On: 01/03/2017 19:09    EKG: Independently reviewed. Rate 91, QTc 467, nonspecific T wave changes, biatrial enlargement, no ST changes, LAFB.          Assessment/Plan  1. Acute on chronic congestive heart failure:  Baseline EF unknown.  No known cardiac disease other than HTN, but has had CVA in past.   -Furosemide 40 mg IV BID -Potassium supplement -Tele -Strict I/Os, daily weights -Daily monitoring renal function -Continue ACEi -Echocardiogram ordered    2. Hypertension and CVA secondary prevention:  -Continue lisinopril -Discontinue HCTZ now on furosemide -Discontinue diltiazem and clonidine -Start hydralazine TID -Continue Plavix  3. Chronic kidney disease, stage IIIa:  Unknown recent baseline, last baseline ~1.3  4. Schizophrenia:  On Haldol monthly injections, next due ~7/8 he thinks.  5. Other medications:  -Continue PPI  6. Elevated d-dimer: Wells score 0 and  nothing clinically to suggest PE over CHF.  PE doubted.           DVT prophylaxis: Lovenox  Code Status: FULL  Family Communication: None present  Disposition Plan: Anticipate IV diuresis and echocardiogram and likely needs 3-4 days diuresisis. Consults called: None Admission status: INPATIENT      Medical decision making: Patient seen at 12:01 AM on 01/04/2017.  The patient was discussed with Dr. Manya Silvas.  What exists of the patient's chart was reviewed in depth and summarized above.  Clinical condition: stable hemodynamically for medical floor.        Edwin Dada [facility] Pager [phone]

## 2017-01-04 NOTE — Progress Notes (Signed)
New Admission Note:   Arrival Method: ED bed with ED tech, ambulated to unit bed. Mental Orientation: A&O x4 Telemetry: initiated Skin: BLE edema, LUE edema, scabs on legs. 2nd nurse verified Pain: denies Safety Measures: safety contract signed, yellow bracelet. Admission: dyspnea with exertion, breathing unlabored while lying.  Orders to be reviewed and implemented. Will continue to monitor the patient. Call light has been placed within reach and bed alarm has been activated.   Riki Altes, RN Phone: [phone]

## 2017-01-05 DIAGNOSIS — I5041 Acute combined systolic (congestive) and diastolic (congestive) heart failure: Secondary | ICD-10-CM

## 2017-01-05 DIAGNOSIS — N183 Chronic kidney disease, stage 3 (moderate): Secondary | ICD-10-CM

## 2017-01-05 DIAGNOSIS — F209 Schizophrenia, unspecified: Secondary | ICD-10-CM

## 2017-01-05 DIAGNOSIS — I5021 Acute systolic (congestive) heart failure: Secondary | ICD-10-CM

## 2017-01-05 DIAGNOSIS — I10 Essential (primary) hypertension: Secondary | ICD-10-CM

## 2017-01-05 LAB — BASIC METABOLIC PANEL
Anion gap: 10 (ref 5–15)
Anion gap: 11 (ref 5–15)
BUN: 20 mg/dL (ref 6–20)
BUN: 21 mg/dL — AB (ref 6–20)
CALCIUM: 8.9 mg/dL (ref 8.9–10.3)
CHLORIDE: 103 mmol/L (ref 101–111)
CO2: 24 mmol/L (ref 22–32)
CO2: 24 mmol/L (ref 22–32)
CREATININE: 1.51 mg/dL — AB (ref 0.61–1.24)
CREATININE: 1.51 mg/dL — AB (ref 0.61–1.24)
Calcium: 8.9 mg/dL (ref 8.9–10.3)
Chloride: 102 mmol/L (ref 101–111)
GFR, EST AFRICAN AMERICAN: 55 mL/min — AB (ref >60)
GFR, EST AFRICAN AMERICAN: 55 mL/min — AB (ref >60)
GFR, EST NON AFRICAN AMERICAN: 47 mL/min — AB (ref >60)
GFR, EST NON AFRICAN AMERICAN: 47 mL/min — AB (ref >60)
Glucose, Bld: 71 mg/dL (ref 65–99)
Glucose, Bld: 72 mg/dL (ref 65–99)
Potassium: 4.7 mmol/L (ref 3.5–5.1)
Potassium: 4.8 mmol/L (ref 3.5–5.1)
SODIUM: 138 mmol/L (ref 135–145)
SODIUM: 136 mmol/L (ref 135–145)

## 2017-01-05 MED ORDER — FUROSEMIDE 10 MG/ML IJ SOLN
80.0000 mg | Freq: Two times a day (BID) | INTRAMUSCULAR | Status: DC
  Administered 2017-01-05 – 2017-01-06 (×2): 80 mg via INTRAVENOUS
  Filled 2017-01-05 (×2): qty 8

## 2017-01-05 MED ORDER — IPRATROPIUM-ALBUTEROL 0.5-2.5 (3) MG/3ML IN SOLN
RESPIRATORY_TRACT | Status: AC
  Filled 2017-01-05: qty 3

## 2017-01-05 MED ORDER — IPRATROPIUM-ALBUTEROL 0.5-2.5 (3) MG/3ML IN SOLN
3.0000 mL | RESPIRATORY_TRACT | Status: DC | PRN
  Administered 2017-01-05: 3 mL via RESPIRATORY_TRACT

## 2017-01-05 MED ORDER — BUDESONIDE 0.25 MG/2ML IN SUSP
0.2500 mg | Freq: Two times a day (BID) | RESPIRATORY_TRACT | Status: DC
  Administered 2017-01-05: 0.25 mg via RESPIRATORY_TRACT
  Filled 2017-01-05: qty 2

## 2017-01-05 NOTE — Progress Notes (Signed)
Patient alarmed 6 beats v-tach.  Patient asleep in bed. VSS. Tylene Fantasia paged. Will continue to monitor.

## 2017-01-05 NOTE — Progress Notes (Signed)
Patient sitting on side of bed, short of breath, wheezing bilaterally.  Patient requesting breathing treatment, however not time for PRN.  Respiratory called who stated she could not adjust the time for prn treatments.  Tylene Fantasia paged and increased frequency of duonebs.  Breathing treatment given to patient, will continue to monitor.

## 2017-01-05 NOTE — Consult Note (Signed)
Primary Physician:  Lysle Rubens   Primary Cardiologist:  New   Asked to see by Dr Dyann Kief for CHF    HPI: Pt is a 66 yo with history od HTN, small CVA, schizophrenia  Admitted on 7/3 wth SOB on exertion  Noticed over past few wks  Also had orthopnea, LE edema, cough, N/ decreased appetit  Seen by Dr Lysle Rubens.  Admtted  POC d dimer greater than 2   Started on IV lasix    Echo yesterday LV normal in size with moderate LVH  Basal septal hypertrophy   Prominent trabeculae.  Stagnant blood at apex  Risk for thrombus  Mod to sever eMR  and TR   LVEF 15%  RVEF low normal   PA P 73 mm   Since admit pt has diuresed 5.5 L     Heavy smoking for 5 years  Ws occasional before that  Pt says his BP has been OK controlled   No Fhx of CAD or CHF       Past Medical History:  Diagnosis Date  . COPD (chronic obstructive pulmonary disease) ([place])   . Hernia, hiatal   . Hypertension   . Schizophrenia ([place])     Medications Prior to Admission  Medication Sig Dispense Refill  . cloNIDine (CATAPRES) 0.2 MG tablet Take 0.2 mg by mouth 2 (two) times daily.     . clopidogrel (PLAVIX) 75 MG tablet Take 1 tablet (75 mg total) by mouth daily with breakfast. 30 tablet 1  . diltiazem (CARDIZEM CD) 240 MG 24 hr capsule Take 240 mg by mouth daily.     Marland Kitchen esomeprazole (NEXIUM) 20 MG packet Take 20 mg by mouth daily before breakfast. 20 each 0  . haloperidol decanoate (HALDOL DECANOATE) 50 MG/ML injection Inject 50 mg into the muscle every 30 (thirty) days.   12  . lisinopril-hydrochlorothiazide (PRINZIDE,ZESTORETIC) 20-12.5 MG per tablet Take 1 tablet by mouth daily.        . carvedilol  6.25 mg Oral BID WC  . clopidogrel  75 mg Oral Q breakfast  . enoxaparin (LOVENOX) injection  40 mg Subcutaneous Q24H  . furosemide  60 mg Intravenous BID  . hydrALAZINE  25 mg Oral Q8H  . lisinopril  20 mg Oral Daily  . nicotine  21 mg Transdermal Daily  . pantoprazole  80 mg Oral Daily  . potassium chloride  40 mEq Oral BID  .  sodium chloride flush  3 mL Intravenous Q12H    Infusions:   Allergies  Allergen Reactions  . Sulfa Antibiotics Nausea Only    Social History   Social History  . Marital status: Single    Spouse name: N/A  . Number of children: N/A  . Years of education: N/A   Occupational History  . Not on file.   Social History Main Topics  . Smoking status: Current Every Day Smoker    Packs/day: 1.50    Types: Cigarettes  . Smokeless tobacco: Never Used  . Alcohol use No  . Drug use: No  . Sexual activity: Not on file   Other Topics Concern  . Not on file   Social History Narrative  . No narrative on file    Family History  Problem Relation Age of Onset  . Hypertension Mother   . Hypertension Father     REVIEW OF SYSTEMS:  All systems reviewed  Negative to the above problem except as noted above.    PHYSICAL EXAM: Vitals:  01/05/17 0027 01/05/17 0341  BP: (!) 149/109 128/90  Pulse: 87 75  Resp: (!) 24 (!) 22  Temp: 98.2 F (36.8 C) 97.7 F (36.5 C)     Intake/Output Summary (Last 24 hours) at 01/05/17 1041 Last data filed at 01/05/17 0930  Gross per 24 hour  Intake             1843 ml  Output             5270 ml  Net            -3427 ml    General:  Thin 66 yo in mild distress  Breathing though pursed lipids   HEENT: normal Neck: supple. JVP is incresed  . Carotids 2+ bilat; no bruits. No lymphadenopathy or thryomegaly appreciated. Cor: PMI nondisplaced. Regular rate & rhythm. No rubs, gallops or murmurs. Lungs:  Rales bilateral bases  WHeezes   Abdomen: soft, nontender, nondistended. No hepatosplenomegaly. No bruits or masses. Good bowel sounds. Extremities: no cyanosis, clubbing, rash Tr  edema Neuro: alert & oriented x 3, cranial nerves grossly intact. moves all 4 extremities w/o difficulty. Affect pleasant.  ECG:  SR   Biatrial enlargment   LAFB   LVH with repolarization abnormality    No results found for this or any previous visit (from the past  24 hour(s)). Dg Chest 2 View  Result Date: 01/03/2017 CLINICAL DATA:  Shortness of breath for 6 months. EXAM: CHEST  2 VIEW COMPARISON:  12/06/2012 FINDINGS: Moderate cardiomegaly, new from prior exam. Mild interstitial edema. Small bilateral pleural effusions, left greater than right. There is fluid in the fissures. No confluent airspace disease. No pneumothorax. No acute osseous abnormalities. IMPRESSION: Moderate cardiomegaly with pulmonary edema and bilateral pleural effusions, consistent with CHF. Electronically Signed   By: Jeb Levering M.D.   On: 01/03/2017 19:09     ASSESSMENT:   1  Acute systolic CHF I have reviewed echo  Mild/mod LVH  LV is upper normal size.  Prominent trabeculations.  Sluggish flow (swirling) at apex but I do not think thrombus   Moderate MR TR Etiology is unclear On exam, pt still with signif volume increase on exam.  Pt cannot lay flat  AWheezing  Breathing with pursed lips  SOme may reflect COPD    Would increase lasix  Follow renal function  Hold ACE /ARB and b blocker for now  Follow output and renal function   Would recomm R and L heart cath when pt improves, can breathe  2  HTN  Follow as diurese    3  CVA  Continue plavix for now    4  CKD stage iII  Follow closely as diurese    4  D Dimer    5  Schizophrena

## 2017-01-05 NOTE — Progress Notes (Signed)
PROGRESS NOTE   Gerald Nelson  PFX:[id number]  DOB: 11/10/1950  DOA: 01/03/2017 PCP: [name], MD  Brief Admission Hx: Gerald Nelson is a 66 y.o. male with a past medical history significant for schizophrenia and HTN and hx of small CVA who presents with few weeks progressive dyspnea on exertion.  MDM/Assessment & Plan:   Acute exacerbation of chronic systolic heart failure - New diagnosis, I confirmed with patient no prior known history of heart disease or heart failure.  His Echo was concerning for EF 15% with evidence of pulmonary hypertension and possible recent apical thrombus.  I am consulting cardiology to help manage and provide recommendations for patient.    Essential Hypertension - lisinopril daily, coreg, hydralazine    CKD stage 3 - following, AM labs pending.   Cerebrovascular disease s/p CVA - continue plavix daily.   Schizophrenia - he reports monthly haldol injections, next due 7/8 per patient  DVT prophylaxis: lovenox Code Status: full Family Communication: patient updated at bedside Disposition Plan: TBD  Consultants:  cardiology  Subjective: Pt says that overall he is feeling better.  He had some severe SOB early in the morning that apparently got better with nebulizer treatments.  He is still diuresing with lasix.    Objective: Vitals:   01/04/17 2032 01/05/17 0027 01/05/17 0319 01/05/17 0341  BP: (!) 142/79 (!) 149/109  128/90  Pulse: 86 87  75  Resp: (!) 22 (!) 24  (!) 22  Temp: 98.2 F (36.8 C) 98.2 F (36.8 C)  97.7 F (36.5 C)  TempSrc: Oral Oral  Oral  SpO2: 96% 100%  100%  Weight:   70.1 kg (154 lb 9.6 oz)   Height:        Intake/Output Summary (Last 24 hours) at 01/05/17 0835 Last data filed at 01/05/17 3299  Gross per 24 hour  Intake             2163 ml  Output             4690 ml  Net            -2527 ml   Filed Weights   01/03/17 1843 01/04/17 0204 01/05/17 0319  Weight: 74.8 kg (165 lb) 70.9 kg (156 lb 6.4 oz) 70.1 kg  (154 lb 9.6 oz)    REVIEW OF SYSTEMS  As per history otherwise all reviewed and reported negative  Exam:  General exam: thin male, chronically ill appearing.  NAD.  Respiratory system: shallow BS bilateral.  Bibasilar crackles.  Cardiovascular system: S1 & S2 heard.   Gastrointestinal system: Abdomen is nondistended, soft and nontender. Normal bowel sounds heard. Central nervous system: Alert and oriented. No focal neurological deficits. Extremities: no cyanosis.  Data Reviewed: Basic Metabolic Panel:  Recent Labs Lab 01/03/17 1846  NA 141  K 3.9  CL 105  CO2 27  GLUCOSE 91  BUN 21*  CREATININE 1.68*  CALCIUM 9.0   Liver Function Tests: No results for input(s): AST, ALT, ALKPHOS, BILITOT, PROT, ALBUMIN in the last 168 hours. No results for input(s): LIPASE, AMYLASE in the last 168 hours. No results for input(s): AMMONIA in the last 168 hours. CBC:  Recent Labs Lab 01/03/17 1846  WBC 7.1  HGB 14.8  HCT 42.7  MCV 90.7  PLT 187   Cardiac Enzymes:  Recent Labs Lab 01/04/17 0320 01/04/17 0736  TROPONINI 0.09* 0.08*   CBG (last 3)  No results for input(s): GLUCAP in the last 72 hours. No  results found for this or any previous visit (from the past 240 hour(s)).   Studies: Dg Chest 2 View  Result Date: 01/03/2017 CLINICAL DATA:  Shortness of breath for 6 months. EXAM: CHEST  2 VIEW COMPARISON:  12/06/2012 FINDINGS: Moderate cardiomegaly, new from prior exam. Mild interstitial edema. Small bilateral pleural effusions, left greater than right. There is fluid in the fissures. No confluent airspace disease. No pneumothorax. No acute osseous abnormalities. IMPRESSION: Moderate cardiomegaly with pulmonary edema and bilateral pleural effusions, consistent with CHF. Electronically Signed   By: Jeb Levering M.D.   On: 01/03/2017 19:09   Scheduled Meds: . carvedilol  6.25 mg Oral BID WC  . clopidogrel  75 mg Oral Q breakfast  . enoxaparin (LOVENOX) injection  40 mg  Subcutaneous Q24H  . furosemide  60 mg Intravenous BID  . hydrALAZINE  25 mg Oral Q8H  . lisinopril  20 mg Oral Daily  . nicotine  21 mg Transdermal Daily  . pantoprazole  80 mg Oral Daily  . potassium chloride  40 mEq Oral BID  . sodium chloride flush  3 mL Intravenous Q12H   Continuous Infusions:  Principal Problem:   Acute CHF (congestive heart failure) ([facility]) Active Problems:   Schizophrenia ([facility])   CKD (chronic kidney disease), stage III   Essential hypertension  Time spent:   Irwin Brakeman, MD, FAAFP [facility] Pager [phone]  If 7PM-7AM, please contact night-coverage [email or web address] Password [email or web address] 01/05/2017, 8:35 AM    LOS: 2 days

## 2017-01-06 MED ORDER — CLOPIDOGREL BISULFATE 75 MG PO TABS
75.0000 mg | ORAL_TABLET | Freq: Every day | ORAL | Status: DC
  Administered 2017-01-06: 75 mg via ORAL
  Filled 2017-01-06: qty 1

## 2017-01-06 NOTE — Discharge Instructions (Signed)
Return if symptoms worsen or new problems develop.

## 2017-01-06 NOTE — Discharge Summary (Signed)
Physician Discharge Summary  Gerald Nelson [id number] DOB: 12-05-1950 DOA: 01/03/2017  PCP: [name], MD  Admit date: 01/03/2017 Discharge date: 01/06/2017   Patient left hospital against Medical Advice   Patient is at high risk for readmission  Admitted From: Home  Recommendations for Outpatient Follow-up:  1. Follow up with PCP ASAP 2. Please refer patient to outpatient cardiologist ASAP 3. Please obtain BMP on outpatient follow up   Discharge Condition: Guarded CODE STATUS: FULL   Brief Hospitalization Summary: Please see all hospital notes, images, labs for full details of the hospitalization. Patient coming from: Dr. Glenna Durand office  Chief Complaint: Dyspnea on exertion  HPI: Gerald Nelson is a 66 y.o. male with a past medical history significant for schizophrenia and HTN and hx of small CVA who presents with few weeks progressive dyspnea on exertion.  The patient was in his usual state of health until a few weeks ago when he started to develop dyspnea on exertion, better with rest. Over the next several weeks, this became progressively worse, until this week he could barely breathe or walk across the room. He also had severe orthopnea, relieved with sitting up, bilateral LE edema to the groin, new dry cough, nausea and decreased appetite.  Today, he went to Dr. Glenna Durand office, who diagnosed CHF and he was sent to the ER for a POC d-dimer >2.  He has no history of prior DVT, PE, cancer.  No recent surgery or immobilization.  No unilateral leg swelling, no chest pain.  ED course: -Afebrile, heart rate 104, respirations 22, blood pressure 150/98, pulse oximetry 97% at rest -Na 141, K 3.9, Cr 1.68 (baseline 1.3 three years ago), WBC 7.1K, Hgb 14.8 -BNP 1681 -CXR shwoed bilateral edema, bilateral small effusions -ECG showed nonspecific changes -His O2 sat dropped to 85% with ambulating a short distance -He was given furosemide 40 IV and put out 750 cc urine and TRH were  asked to admit for CHF  MDM/Assessment & Plan:   Cardiomyopathy / Acute exacerbation of chronic systolic heart failure - New diagnosis, I confirmed with patient no prior known history of heart disease or heart failure.  His Echo was concerning for EF 15% with evidence of pulmonary hypertension and possible recent apical thrombus.  I consulted cardiology to help manage and provide recommendations for patient.  Dr. Harrington Challenger saw the patient and increased his lasix for diuresis, she recommending holding ACE/ARB and B blocker for now.   She recommended right and left heart cath when patient improved clinically and could breathe better.   Unfortunately I came to see the patient on the morning of 01/06/17 and the patient was fully dressed and said that he was leaving the hospital.  He was unwilling to stay longer for further diagnosis and treatment.  I explained the risks of leaving the hospital including sudden death and he verbalized understanding.  He left the hospital against medical advice.  He said that he called Dr. Sherilyn Cooter office today would pursue outpatient treatment and management for his cardiomyopathy and CHF and would see him in the office in 1-2 days and get an outpatient cardiology referral.   He was also concerned about cost associated with staying in the hospital and I tried to reassure him that he would care for him fully and stabilize him regardless of his ability to pay.  He verbalized understanding but said he didn't want to stay for further care.   Essential Hypertension -  Hydralazine was used in the  hospital  CKD stage 3 - following, recommend  Cerebrovascular disease s/p CVA - continue plavix daily.   Schizophrenia - he reports monthly haldol injections, next due 7/8 per patient  DVT prophylaxis: lovenox Code Status: full Family Communication: patient updated at bedside Disposition Plan: TBD  Consultants:  cardiology  Discharge Diagnoses:  Principal Problem:   Acute CHF  (congestive heart failure) ([place]) Active Problems:   Schizophrenia ([place])   CKD (chronic kidney disease), stage III   Essential hypertension   Acute systolic CHF (congestive heart failure) ([place])  Discharge Instructions:  Allergies as of 01/06/2017      Reactions   Sulfa Antibiotics Nausea Only      Allergies  Allergen Reactions  . Sulfa Antibiotics Nausea Only   Current Discharge Medication List    CONTINUE these medications which have NOT CHANGED   Details  cloNIDine (CATAPRES) 0.2 MG tablet Take 0.2 mg by mouth 2 (two) times daily.     clopidogrel (PLAVIX) 75 MG tablet Take 1 tablet (75 mg total) by mouth daily with breakfast. Qty: 30 tablet, Refills: 1    diltiazem (CARDIZEM CD) 240 MG 24 hr capsule Take 240 mg by mouth daily.     esomeprazole (NEXIUM) 20 MG packet Take 20 mg by mouth daily before breakfast. Qty: 20 each, Refills: 0    haloperidol decanoate (HALDOL DECANOATE) 50 MG/ML injection Inject 50 mg into the muscle every 30 (thirty) days.  Refills: 12    lisinopril-hydrochlorothiazide (PRINZIDE,ZESTORETIC) 20-12.5 MG per tablet Take 1 tablet by mouth daily.         Procedures/Studies: Dg Chest 2 View  Result Date: 01/03/2017 CLINICAL DATA:  Shortness of breath for 6 months. EXAM: CHEST  2 VIEW COMPARISON:  12/06/2012 FINDINGS: Moderate cardiomegaly, new from prior exam. Mild interstitial edema. Small bilateral pleural effusions, left greater than right. There is fluid in the fissures. No confluent airspace disease. No pneumothorax. No acute osseous abnormalities. IMPRESSION: Moderate cardiomegaly with pulmonary edema and bilateral pleural effusions, consistent with CHF. Electronically Signed   By: Jeb Levering M.D.   On: 01/03/2017 19:09    Subjective: Pt says that he feels better and is going home, He is not staying any longer.     Discharge Exam: Vitals:   01/06/17 0538 01/06/17 0804  BP: (!) 145/106 (!) 147/108  Pulse: 86 90  Resp: 20   Temp: 97.9  F (36.6 C)    Vitals:   01/05/17 1936 01/05/17 2042 01/06/17 0538 01/06/17 0804  BP:  128/89 (!) 145/106 (!) 147/108  Pulse:  81 86 90  Resp:  (!) 21 20   Temp:  97.7 F (36.5 C) 97.9 F (36.6 C)   TempSrc:  Oral Oral   SpO2: 98% 100% 100%   Weight:   66.2 kg (145 lb 14.4 oz)   Height:       General: Pt is alert, awake, not in acute distress Cardiovascular:  S1/S2 +, no rubs, no gallops Respiratory: CTA bilaterally, no wheezing, no rhonchi Abdominal: Soft, NT, ND, bowel sounds + Extremities: 2+ edema BLEs   The results of significant diagnostics from this hospitalization (including imaging, microbiology, ancillary and laboratory) are listed below for reference.     Microbiology: No results found for this or any previous visit (from the past 240 hour(s)).   Labs: BNP (last 3 results)  Recent Labs  01/03/17 2222  BNP 4,709.6*   Basic Metabolic Panel:  Recent Labs Lab 01/03/17 1846 01/05/17 1146 01/05/17 1217  NA 141  138 136  K 3.9 4.7 4.8  CL 105 103 102  CO2 27 24 24   GLUCOSE 91 72 71  BUN 21* 20 21*  CREATININE 1.68* 1.51* 1.51*  CALCIUM 9.0 8.9 8.9   Liver Function Tests: No results for input(s): AST, ALT, ALKPHOS, BILITOT, PROT, ALBUMIN in the last 168 hours. No results for input(s): LIPASE, AMYLASE in the last 168 hours. No results for input(s): AMMONIA in the last 168 hours. CBC:  Recent Labs Lab 01/03/17 1846  WBC 7.1  HGB 14.8  HCT 42.7  MCV 90.7  PLT 187   Cardiac Enzymes:  Recent Labs Lab 01/04/17 0320 01/04/17 0736  TROPONINI 0.09* 0.08*   BNP: Invalid input(s): POCBNP CBG: No results for input(s): GLUCAP in the last 168 hours. D-Dimer No results for input(s): DDIMER in the last 72 hours. Hgb A1c No results for input(s): HGBA1C in the last 72 hours. Lipid Profile No results for input(s): CHOL, HDL, LDLCALC, TRIG, CHOLHDL, LDLDIRECT in the last 72 hours. Thyroid function studies  Recent Labs  01/04/17 0320  TSH 3.251    Anemia work up No results for input(s): VITAMINB12, FOLATE, FERRITIN, TIBC, IRON, RETICCTPCT in the last 72 hours. Urinalysis    Component Value Date/Time   COLORURINE YELLOW 12/11/2013 1733   APPEARANCEUR CLEAR 12/11/2013 1733   LABSPEC 1.014 12/11/2013 1733   PHURINE 5.5 12/11/2013 1733   GLUCOSEU NEGATIVE 12/11/2013 1733   HGBUR NEGATIVE 12/11/2013 1733   BILIRUBINUR NEGATIVE 12/11/2013 1733   KETONESUR NEGATIVE 12/11/2013 1733   PROTEINUR NEGATIVE 12/11/2013 1733   UROBILINOGEN 1.0 12/11/2013 1733   NITRITE NEGATIVE 12/11/2013 1733   LEUKOCYTESUR NEGATIVE 12/11/2013 1733   Sepsis Labs Invalid input(s): PROCALCITONIN,  WBC,  LACTICIDVEN Microbiology No results found for this or any previous visit (from the past 240 hour(s)).  Time coordinating discharge: 27 mins  SIGNED:  Irwin Brakeman, MD  [facility] 01/06/2017, 9:14 AM Pager [phone]  If 7PM-7AM, please contact night-coverage [email or web address] Password [email or web address]

## 2017-01-07 ENCOUNTER — Other Ambulatory Visit: Payer: Self-pay

## 2017-01-26 ENCOUNTER — Inpatient Hospital Stay (HOSPITAL_COMMUNITY)
Admission: EM | Admit: 2017-01-26 | Discharge: 2017-01-31 | DRG: 291 | Discharge status: Against Medical Advice | Payer: Medicare Other | Attending: Internal Medicine | Admitting: Internal Medicine

## 2017-01-26 ENCOUNTER — Emergency Department (HOSPITAL_COMMUNITY): Payer: Medicare Other

## 2017-01-26 ENCOUNTER — Encounter (HOSPITAL_COMMUNITY): Payer: Self-pay

## 2017-01-26 DIAGNOSIS — Z79899 Other long term (current) drug therapy: Secondary | ICD-10-CM

## 2017-01-26 DIAGNOSIS — I700 Atherosclerosis of aorta: Secondary | ICD-10-CM | POA: Diagnosis present

## 2017-01-26 DIAGNOSIS — I130 Hypertensive heart and chronic kidney disease with heart failure and stage 1 through stage 4 chronic kidney disease, or unspecified chronic kidney disease: Secondary | ICD-10-CM | POA: Diagnosis present

## 2017-01-26 DIAGNOSIS — F17210 Nicotine dependence, cigarettes, uncomplicated: Secondary | ICD-10-CM | POA: Diagnosis present

## 2017-01-26 DIAGNOSIS — I361 Nonrheumatic tricuspid (valve) insufficiency: Secondary | ICD-10-CM | POA: Diagnosis not present

## 2017-01-26 DIAGNOSIS — I5023 Acute on chronic systolic (congestive) heart failure: Secondary | ICD-10-CM

## 2017-01-26 DIAGNOSIS — J9601 Acute respiratory failure with hypoxia: Secondary | ICD-10-CM | POA: Diagnosis present

## 2017-01-26 DIAGNOSIS — I272 Pulmonary hypertension, unspecified: Secondary | ICD-10-CM | POA: Diagnosis present

## 2017-01-26 DIAGNOSIS — I429 Cardiomyopathy, unspecified: Secondary | ICD-10-CM | POA: Diagnosis present

## 2017-01-26 DIAGNOSIS — Z8249 Family history of ischemic heart disease and other diseases of the circulatory system: Secondary | ICD-10-CM

## 2017-01-26 DIAGNOSIS — R748 Abnormal levels of other serum enzymes: Secondary | ICD-10-CM | POA: Diagnosis not present

## 2017-01-26 DIAGNOSIS — Z8673 Personal history of transient ischemic attack (TIA), and cerebral infarction without residual deficits: Secondary | ICD-10-CM | POA: Diagnosis not present

## 2017-01-26 DIAGNOSIS — I340 Nonrheumatic mitral (valve) insufficiency: Secondary | ICD-10-CM | POA: Diagnosis not present

## 2017-01-26 DIAGNOSIS — J449 Chronic obstructive pulmonary disease, unspecified: Secondary | ICD-10-CM | POA: Diagnosis present

## 2017-01-26 DIAGNOSIS — Z720 Tobacco use: Secondary | ICD-10-CM | POA: Diagnosis not present

## 2017-01-26 DIAGNOSIS — N183 Chronic kidney disease, stage 3 unspecified: Secondary | ICD-10-CM | POA: Diagnosis present

## 2017-01-26 DIAGNOSIS — Z7902 Long term (current) use of antithrombotics/antiplatelets: Secondary | ICD-10-CM

## 2017-01-26 DIAGNOSIS — N179 Acute kidney failure, unspecified: Secondary | ICD-10-CM | POA: Diagnosis present

## 2017-01-26 DIAGNOSIS — I5041 Acute combined systolic (congestive) and diastolic (congestive) heart failure: Secondary | ICD-10-CM | POA: Diagnosis present

## 2017-01-26 DIAGNOSIS — Z9114 Patient's other noncompliance with medication regimen: Secondary | ICD-10-CM | POA: Diagnosis not present

## 2017-01-26 DIAGNOSIS — I509 Heart failure, unspecified: Secondary | ICD-10-CM

## 2017-01-26 DIAGNOSIS — I081 Rheumatic disorders of both mitral and tricuspid valves: Secondary | ICD-10-CM | POA: Diagnosis present

## 2017-01-26 DIAGNOSIS — N289 Disorder of kidney and ureter, unspecified: Secondary | ICD-10-CM

## 2017-01-26 DIAGNOSIS — I5021 Acute systolic (congestive) heart failure: Secondary | ICD-10-CM | POA: Diagnosis not present

## 2017-01-26 DIAGNOSIS — I248 Other forms of acute ischemic heart disease: Secondary | ICD-10-CM | POA: Diagnosis present

## 2017-01-26 DIAGNOSIS — I160 Hypertensive urgency: Secondary | ICD-10-CM | POA: Diagnosis present

## 2017-01-26 DIAGNOSIS — Z960 Presence of urogenital implants: Secondary | ICD-10-CM | POA: Diagnosis present

## 2017-01-26 DIAGNOSIS — R0602 Shortness of breath: Secondary | ICD-10-CM | POA: Diagnosis not present

## 2017-01-26 DIAGNOSIS — Z716 Tobacco abuse counseling: Secondary | ICD-10-CM

## 2017-01-26 DIAGNOSIS — Z881 Allergy status to other antibiotic agents status: Secondary | ICD-10-CM

## 2017-01-26 DIAGNOSIS — R778 Other specified abnormalities of plasma proteins: Secondary | ICD-10-CM

## 2017-01-26 DIAGNOSIS — F203 Undifferentiated schizophrenia: Secondary | ICD-10-CM | POA: Diagnosis present

## 2017-01-26 DIAGNOSIS — I10 Essential (primary) hypertension: Secondary | ICD-10-CM | POA: Diagnosis not present

## 2017-01-26 DIAGNOSIS — R7989 Other specified abnormal findings of blood chemistry: Secondary | ICD-10-CM

## 2017-01-26 DIAGNOSIS — I2729 Other secondary pulmonary hypertension: Secondary | ICD-10-CM | POA: Diagnosis not present

## 2017-01-26 DIAGNOSIS — F209 Schizophrenia, unspecified: Secondary | ICD-10-CM | POA: Diagnosis present

## 2017-01-26 LAB — I-STAT TROPONIN, ED: TROPONIN I, POC: 0.1 ng/mL — AB (ref 0.00–0.08)

## 2017-01-26 LAB — BASIC METABOLIC PANEL
Anion gap: 7 (ref 5–15)
BUN: 22 mg/dL — ABNORMAL HIGH (ref 6–20)
CHLORIDE: 106 mmol/L (ref 101–111)
CO2: 26 mmol/L (ref 22–32)
CREATININE: 1.35 mg/dL — AB (ref 0.61–1.24)
Calcium: 8.9 mg/dL (ref 8.9–10.3)
GFR calc non Af Amer: 54 mL/min — ABNORMAL LOW (ref >60)
Glucose, Bld: 134 mg/dL — ABNORMAL HIGH (ref 65–99)
Potassium: 4.1 mmol/L (ref 3.5–5.1)
Sodium: 139 mmol/L (ref 135–145)

## 2017-01-26 LAB — CBC
HCT: 39.8 % (ref 39.0–52.0)
HEMOGLOBIN: 13.8 g/dL (ref 13.0–17.0)
MCH: 31.3 pg (ref 26.0–34.0)
MCHC: 34.7 g/dL (ref 30.0–36.0)
MCV: 90.2 fL (ref 78.0–100.0)
PLATELETS: 224 10*3/uL (ref 150–400)
RBC: 4.41 MIL/uL (ref 4.22–5.81)
RDW: 13.9 % (ref 11.5–15.5)
WBC: 7.5 10*3/uL (ref 4.0–10.5)

## 2017-01-26 LAB — BRAIN NATRIURETIC PEPTIDE: B NATRIURETIC PEPTIDE 5: 3641 pg/mL — AB (ref 0.0–100.0)

## 2017-01-26 MED ORDER — FUROSEMIDE 10 MG/ML IJ SOLN
40.0000 mg | Freq: Once | INTRAMUSCULAR | Status: AC
  Administered 2017-01-26: 40 mg via INTRAVENOUS
  Filled 2017-01-26: qty 4

## 2017-01-26 MED ORDER — NITROGLYCERIN IN D5W 200-5 MCG/ML-% IV SOLN
10.0000 ug/min | INTRAVENOUS | Status: DC
  Administered 2017-01-26: 10 ug/min via INTRAVENOUS
  Administered 2017-01-28: 30 ug/min via INTRAVENOUS
  Administered 2017-01-29: 15 ug/min via INTRAVENOUS
  Filled 2017-01-26 (×2): qty 250

## 2017-01-26 MED ORDER — HEPARIN (PORCINE) IN NACL 100-0.45 UNIT/ML-% IJ SOLN
1250.0000 [IU]/h | INTRAMUSCULAR | Status: DC
  Administered 2017-01-27: 750 [IU]/h via INTRAVENOUS
  Administered 2017-01-28: 1150 [IU]/h via INTRAVENOUS
  Administered 2017-01-31: 1250 [IU]/h via INTRAVENOUS
  Filled 2017-01-26 (×7): qty 250

## 2017-01-26 MED ORDER — HEPARIN BOLUS VIA INFUSION
3000.0000 [IU] | Freq: Once | INTRAVENOUS | Status: AC
  Administered 2017-01-27: 3000 [IU] via INTRAVENOUS
  Filled 2017-01-26: qty 3000

## 2017-01-26 NOTE — ED Triage Notes (Signed)
Pt here for sob, and cough since discharge on7/5, pt with congested cough and reports non productive, denies chest pain dizziness, nausea or vomiting.

## 2017-01-26 NOTE — Progress Notes (Signed)
ANTICOAGULATION CONSULT NOTE - Initial Consult  Pharmacy Consult for heparin Indication: chest pain/ACS  Allergies  Allergen Reactions  . Sulfa Antibiotics Nausea Only    Patient Measurements: Height: 5\' 10"  (177.8 cm) Weight: 145 lb 15.1 oz (66.2 kg) IBW/kg (Calculated) : 73  Vital Signs: Temp: 97.8 F (36.6 C) (07/25 2146) Temp Source: Oral (07/25 2146) BP: 185/132 (07/25 2308) Pulse Rate: 101 (07/25 2308)  Labs:  Recent Labs  01/26/17 2152  HGB 13.8  HCT 39.8  PLT 224  CREATININE 1.35*    Estimated Creatinine Clearance: 51.8 mL/min (A) (by C-G formula based on SCr of 1.35 mg/dL (H)).   Medical History: Past Medical History:  Diagnosis Date  . COPD (chronic obstructive pulmonary disease) ([place])   . Hernia, hiatal   . Hypertension   . Schizophrenia [facility])     Assessment: 66yo male c/o non-productive cough and SOB, initial istat troponin elevated, to begin heparin.  Goal of Therapy:  Heparin level 0.3-0.7 units/ml Monitor platelets by anticoagulation protocol: Yes   Plan:  Will give heparin 3000 units IV bolus x1 followed by gtt at 750 units/hr and monitor heparin levels and CBC.  Wynona Neat, PharmD, BCPS  01/26/2017,11:29 PM

## 2017-01-26 NOTE — ED Provider Notes (Signed)
[facility] DEPT Provider Note   CSN: [id number] Arrival date & time: 01/26/17  2140  By signing my name below, I, Ny'Kea Lewis, attest that this documentation has been prepared under the direction and in the presence of Delora Fuel, MD. Electronically Signed: Lise Auer, ED Scribe. 01/26/17. 11:15 PM.  History   Chief Complaint Chief Complaint  Patient presents with  . Shortness of Breath   The history is provided by the patient. No language interpreter was used.   HPI HPI Comments: Gerald Nelson is a 66 y.o. male with a PMHx of COPD, HTN, and schizophrenia, who presents to the Emergency Department complaining of gradually worsening, persistent shortness of breath since 7/5. Pt notes an associated cough with occasional mild brown production and leg swelling. Pt was admitted to the hospital on 7/2 for hypoxia, and pt left AMA on 7/5. He reports his shortness of breath has been worsening since. He reports he has not seen his PCP, due to him being out of town.  His breathing is exacerbated with exertion and when laying flat. Denies chest pain, chest tightness, nausea, vomiting, or diaphoresis.    Past Medical History:  Diagnosis Date  . COPD (chronic obstructive pulmonary disease) ([place])   . Hernia, hiatal   . Hypertension   . Schizophrenia [facility])    Patient Active Problem List   Diagnosis Date Noted  . Acute systolic CHF (congestive heart failure) ([place])   . CKD (chronic kidney disease), stage III 01/04/2017  . Essential hypertension 01/04/2017  . Acute CHF (congestive heart failure) ([place]) 01/03/2017  . CVA (cerebral infarction) 12/12/2013  . Hiatal hernia 12/06/2012  . Syncope 12/06/2012  . Schizophrenia [facility])     Past Surgical History:  Procedure Laterality Date  . PENILE PROSTHESIS IMPLANT      Home Medications    Prior to Admission medications   Medication Sig Start Date End Date Taking? Authorizing Provider  cloNIDine (CATAPRES) 0.2 MG tablet Take 0.2 mg by  mouth 2 (two) times daily.     [provider]  clopidogrel (PLAVIX) 75 MG tablet Take 1 tablet (75 mg total) by mouth daily with breakfast. 12/12/13   Hosie Poisson, MD  diltiazem (CARDIZEM CD) 240 MG 24 hr capsule Take 240 mg by mouth daily.  11/03/13   [provider]  esomeprazole (NEXIUM) 20 MG packet Take 20 mg by mouth daily before breakfast. 12/05/13   Triplett, Tammy, PA-C  haloperidol decanoate (HALDOL DECANOATE) 50 MG/ML injection Inject 50 mg into the muscle every 30 (thirty) days.  12/06/16   [provider]  lisinopril-hydrochlorothiazide (PRINZIDE,ZESTORETIC) 20-12.5 MG per tablet Take 1 tablet by mouth daily.  11/05/13   [provider]   Family History Family History  Problem Relation Age of Onset  . Hypertension Mother   . Hypertension Father     Social History Social History  Substance Use Topics  . Smoking status: Current Every Day Smoker    Packs/day: 1.50    Types: Cigarettes  . Smokeless tobacco: Never Used  . Alcohol use No   Allergies   Sulfa antibiotics  Review of Systems Review of Systems  Constitutional: Negative for diaphoresis.  Respiratory: Positive for cough and shortness of breath. Negative for chest tightness.   Cardiovascular: Positive for leg swelling. Negative for chest pain.  Gastrointestinal: Negative for nausea and vomiting.  All other systems reviewed and are negative.  Physical Exam Updated Vital Signs BP (!) 167/111 (BP Location: Left Arm)   Pulse (!) 117  Temp 97.8 F (36.6 C) (Oral)   Resp 17   SpO2 98%   Physical Exam  Constitutional: He is oriented to person, place, and time. He appears well-developed and well-nourished. He appears distressed.  In mild respiratory distress using accessory muscles of respirations, but able to speak in complete sentences.  HENT:  Head: Normocephalic and atraumatic.  Eyes: Pupils are equal, round, and reactive to light. EOM are normal.  Neck: Normal range of  motion. Neck supple. JVD present.  Cardiovascular: Normal rate, regular rhythm and normal heart sounds.   No murmur heard. Pulmonary/Chest: He is in respiratory distress. He has no wheezes. He has rales. He exhibits no tenderness.  Course breath sounds diffusely with few bibasilar rales.   Abdominal: Soft. Bowel sounds are normal. He exhibits no distension and no mass. There is no tenderness.  Musculoskeletal: Normal range of motion. He exhibits no edema.  2+ pretibial edema.   Lymphadenopathy:    He has no cervical adenopathy.  Neurological: He is alert and oriented to person, place, and time. No cranial nerve deficit. He exhibits normal muscle tone. Coordination normal.  Skin: Skin is warm and dry. No rash noted.  Psychiatric: He has a normal mood and affect. His behavior is normal. Judgment and thought content normal.  Nursing note and vitals reviewed.   ED Treatments / Results  DIAGNOSTIC STUDIES: Oxygen Saturation is 98% on RA, normal by my interpretation.   COORDINATION OF CARE: 11:11 PM-Discussed next steps with pt. Pt verbalized understanding and is agreeable with the plan.   Labs (all labs ordered are listed, but only abnormal results are displayed) Labs Reviewed  BASIC METABOLIC PANEL - Abnormal; Notable for the following:       Result Value   Glucose, Bld 134 (*)    BUN 22 (*)    Creatinine, Ser 1.35 (*)    GFR calc non Af Amer 54 (*)    All other components within normal limits  I-STAT TROPONIN, ED - Abnormal; Notable for the following:    Troponin i, poc 0.10 (*)    All other components within normal limits  CBC  BRAIN NATRIURETIC PEPTIDE  HEPARIN LEVEL (UNFRACTIONATED)  CBC    EKG  EKG Interpretation  Date/Time:  Wednesday January 26 2017 21:42:08 EDT Ventricular Rate:  97 PR Interval:  152 QRS Duration: 106 QT Interval:  388 QTC Calculation: 492 R Axis:   4 Text Interpretation:  Normal sinus rhythm Biatrial enlargement Incomplete left bundle branch  block ST & T wave abnormality, consider lateral ischemia Prolonged QT Abnormal ECG When compared with ECG of 01/03/2017, QT has lengthened Confirmed by Delora Fuel ([id number]) on 01/26/2017 10:57:30 PM       Radiology Dg Chest 2 View  Result Date: 01/26/2017 CLINICAL DATA:  Shortness of breath for 2 weeks EXAM: CHEST  2 VIEW COMPARISON:  01/03/2017 FINDINGS: Cardiac shadow remains enlarged. Aortic calcifications are seen. The lungs are well aerated bilaterally. Some very minimal atelectatic changes are noted on the left. No sizable effusion is seen. Degenerative changes of the thoracic spine are noted. IMPRESSION: Minimal persistent changes in the left base. No other focal abnormality is noted. Electronically Signed   By: Inez Catalina M.D.   On: 01/26/2017 22:25    Procedures Procedures (including critical care time) CRITICAL CARE Performed by: HUDJS,HFWYO Total critical care time: 45 minutes Critical care time was exclusive of separately billable procedures and treating other patients. Critical care was necessary to treat or prevent imminent or  life-threatening deterioration. Critical care was time spent personally by me on the following activities: development of treatment plan with patient and/or surrogate as well as nursing, discussions with consultants, evaluation of patient's response to treatment, examination of patient, obtaining history from patient or surrogate, ordering and performing treatments and interventions, ordering and review of laboratory studies, ordering and review of radiographic studies, pulse oximetry and re-evaluation of patient's condition.  Medications Ordered in ED Medications  nitroGLYCERIN 50 mg in dextrose 5 % 250 mL (0.2 mg/mL) infusion (10 mcg/min Intravenous New Bag/Given 01/26/17 2320)  heparin bolus via infusion 3,000 Units (not administered)  heparin ADULT infusion 100 units/mL (25000 units/246mL sodium chloride 0.45%) (not administered)  furosemide (LASIX)  injection 40 mg (40 mg Intravenous Given 01/26/17 2319)     Initial Impression / Assessment and Plan / ED Course  I have reviewed the triage vital signs and the nursing notes.  Pertinent labs & imaging results that were available during my care of the patient were reviewed by me and considered in my medical decision making (see chart for details).  CHF exacerbation. Old records are reviewed, and he had been in the hospital 3 weeks ago with CHF and left AGAINST MEDICAL ADVICE. Ejection fraction was noted to be 15%. Here, he is noted to be extremely hypertensive with physical findings consistent with congestive heart failure. Troponin is mildly elevated, but not significantly different from what it had been when he was in the hospital previously. This is not felt to represent a non-STEMI, but probably demand ischemia related to his heart failure. He has underlying renal insufficiency which is actually somewhat improved. He started on nitroglycerin for blood pressure control, given heparin because of elevated troponin, and given intravenous furosemide. Case is discussed with Dr. Hal Hope of [facility] who agrees to admit the patient.  Final Clinical Impressions(s) / ED Diagnoses   Final diagnoses:  Acute on chronic systolic CHF (congestive heart failure) (HCC)  Hypertensive urgency  Elevated troponin I level  Renal insufficiency    New Prescriptions New Prescriptions   No medications on file   I personally performed the services described in this documentation, which was scribed in my presence. The recorded information has been reviewed and is accurate.       Delora Fuel, MD 55/20/80 [phone]

## 2017-01-27 ENCOUNTER — Encounter (HOSPITAL_COMMUNITY): Payer: Self-pay | Admitting: Internal Medicine

## 2017-01-27 DIAGNOSIS — J9601 Acute respiratory failure with hypoxia: Secondary | ICD-10-CM

## 2017-01-27 DIAGNOSIS — I160 Hypertensive urgency: Secondary | ICD-10-CM

## 2017-01-27 DIAGNOSIS — Z720 Tobacco use: Secondary | ICD-10-CM

## 2017-01-27 DIAGNOSIS — N179 Acute kidney failure, unspecified: Secondary | ICD-10-CM

## 2017-01-27 DIAGNOSIS — I5023 Acute on chronic systolic (congestive) heart failure: Secondary | ICD-10-CM

## 2017-01-27 DIAGNOSIS — I5021 Acute systolic (congestive) heart failure: Secondary | ICD-10-CM

## 2017-01-27 DIAGNOSIS — R748 Abnormal levels of other serum enzymes: Secondary | ICD-10-CM

## 2017-01-27 DIAGNOSIS — I272 Pulmonary hypertension, unspecified: Secondary | ICD-10-CM

## 2017-01-27 DIAGNOSIS — F203 Undifferentiated schizophrenia: Secondary | ICD-10-CM

## 2017-01-27 DIAGNOSIS — N183 Chronic kidney disease, stage 3 (moderate): Secondary | ICD-10-CM

## 2017-01-27 LAB — CBC WITH DIFFERENTIAL/PLATELET
BASOS ABS: 0 10*3/uL (ref 0.0–0.1)
BASOS PCT: 0 %
EOS ABS: 0.1 10*3/uL (ref 0.0–0.7)
Eosinophils Relative: 1 %
HEMATOCRIT: 41.2 % (ref 39.0–52.0)
HEMOGLOBIN: 14.2 g/dL (ref 13.0–17.0)
LYMPHS ABS: 1.4 10*3/uL (ref 0.7–4.0)
LYMPHS PCT: 19 %
MCH: 31.3 pg (ref 26.0–34.0)
MCHC: 34.5 g/dL (ref 30.0–36.0)
MCV: 90.7 fL (ref 78.0–100.0)
Monocytes Absolute: 0.8 10*3/uL (ref 0.1–1.0)
Monocytes Relative: 11 %
NEUTROS ABS: 5.2 10*3/uL (ref 1.7–7.7)
Neutrophils Relative %: 69 %
Platelets: 195 10*3/uL (ref 150–400)
RBC: 4.54 MIL/uL (ref 4.22–5.81)
RDW: 14 % (ref 11.5–15.5)
WBC: 7.5 10*3/uL (ref 4.0–10.5)

## 2017-01-27 LAB — MAGNESIUM: MAGNESIUM: 1.7 mg/dL (ref 1.7–2.4)

## 2017-01-27 LAB — COMPREHENSIVE METABOLIC PANEL
ALBUMIN: 3 g/dL — AB (ref 3.5–5.0)
ALK PHOS: 68 U/L (ref 38–126)
ALT: 27 U/L (ref 17–63)
ANION GAP: 12 (ref 5–15)
AST: 43 U/L — ABNORMAL HIGH (ref 15–41)
BUN: 22 mg/dL — ABNORMAL HIGH (ref 6–20)
CO2: 24 mmol/L (ref 22–32)
Calcium: 8.7 mg/dL — ABNORMAL LOW (ref 8.9–10.3)
Chloride: 104 mmol/L (ref 101–111)
Creatinine, Ser: 1.4 mg/dL — ABNORMAL HIGH (ref 0.61–1.24)
GFR calc Af Amer: 60 mL/min — ABNORMAL LOW (ref >60)
GFR calc non Af Amer: 52 mL/min — ABNORMAL LOW (ref >60)
GLUCOSE: 85 mg/dL (ref 65–99)
POTASSIUM: 4.9 mmol/L (ref 3.5–5.1)
SODIUM: 140 mmol/L (ref 135–145)
Total Bilirubin: 1.8 mg/dL — ABNORMAL HIGH (ref 0.3–1.2)
Total Protein: 6 g/dL — ABNORMAL LOW (ref 6.5–8.1)

## 2017-01-27 LAB — EXPECTORATED SPUTUM ASSESSMENT W REFEX TO RESP CULTURE

## 2017-01-27 LAB — EXPECTORATED SPUTUM ASSESSMENT W GRAM STAIN, RFLX TO RESP C

## 2017-01-27 LAB — TSH: TSH: 3.096 u[IU]/mL (ref 0.350–4.500)

## 2017-01-27 LAB — HEPARIN LEVEL (UNFRACTIONATED)
Heparin Unfractionated: 0.12 IU/mL — ABNORMAL LOW (ref 0.30–0.70)
Heparin Unfractionated: <0.1 IU/mL — ABNORMAL LOW (ref 0.30–0.70)

## 2017-01-27 LAB — MRSA PCR SCREENING: MRSA BY PCR: NEGATIVE

## 2017-01-27 MED ORDER — NICOTINE 21 MG/24HR TD PT24
21.0000 mg | MEDICATED_PATCH | Freq: Once | TRANSDERMAL | Status: AC
  Administered 2017-01-27 (×2): 21 mg via TRANSDERMAL
  Filled 2017-01-27: qty 1

## 2017-01-27 MED ORDER — DM-GUAIFENESIN ER 30-600 MG PO TB12
1.0000 | ORAL_TABLET | Freq: Two times a day (BID) | ORAL | Status: DC
  Administered 2017-01-27 – 2017-01-31 (×9): 1 via ORAL
  Filled 2017-01-27 (×9): qty 1

## 2017-01-27 MED ORDER — ONDANSETRON HCL 4 MG/2ML IJ SOLN: 4.0000 mg | Freq: Four times a day (QID) | INTRAMUSCULAR | Status: DC | PRN

## 2017-01-27 MED ORDER — FUROSEMIDE 10 MG/ML IJ SOLN
60.0000 mg | Freq: Two times a day (BID) | INTRAMUSCULAR | Status: DC
  Administered 2017-01-27 – 2017-01-28 (×3): 60 mg via INTRAVENOUS
  Filled 2017-01-27 (×3): qty 6

## 2017-01-27 MED ORDER — LEVALBUTEROL HCL 1.25 MG/0.5ML IN NEBU
1.2500 mg | INHALATION_SOLUTION | Freq: Four times a day (QID) | RESPIRATORY_TRACT | Status: DC
  Administered 2017-01-27: 1.25 mg via RESPIRATORY_TRACT
  Filled 2017-01-27: qty 0.5

## 2017-01-27 MED ORDER — NICOTINE 21 MG/24HR TD PT24
MEDICATED_PATCH | TRANSDERMAL | Status: AC
  Administered 2017-01-27: 21 mg via TRANSDERMAL
  Filled 2017-01-27: qty 1

## 2017-01-27 MED ORDER — LEVALBUTEROL HCL 1.25 MG/0.5ML IN NEBU
1.2500 mg | INHALATION_SOLUTION | Freq: Three times a day (TID) | RESPIRATORY_TRACT | Status: DC
  Administered 2017-01-27: 1.25 mg via RESPIRATORY_TRACT
  Filled 2017-01-27 (×2): qty 0.5

## 2017-01-27 MED ORDER — METOPROLOL TARTRATE 25 MG PO TABS
25.0000 mg | ORAL_TABLET | Freq: Two times a day (BID) | ORAL | Status: DC
  Administered 2017-01-27 – 2017-01-29 (×5): 25 mg via ORAL
  Filled 2017-01-27 (×5): qty 1

## 2017-01-27 MED ORDER — ACETAMINOPHEN 325 MG PO TABS: 650.0000 mg | ORAL_TABLET | Freq: Four times a day (QID) | ORAL | Status: DC | PRN

## 2017-01-27 MED ORDER — HEPARIN BOLUS VIA INFUSION
2000.0000 [IU] | Freq: Once | INTRAVENOUS | Status: AC
  Administered 2017-01-27: 2000 [IU] via INTRAVENOUS
  Filled 2017-01-27: qty 2000

## 2017-01-27 MED ORDER — ONDANSETRON HCL 4 MG PO TABS: 4.0000 mg | ORAL_TABLET | Freq: Four times a day (QID) | ORAL | Status: DC | PRN

## 2017-01-27 MED ORDER — POTASSIUM CHLORIDE CRYS ER 10 MEQ PO TBCR
10.0000 meq | EXTENDED_RELEASE_TABLET | Freq: Every day | ORAL | Status: DC
  Administered 2017-01-27 – 2017-01-28 (×2): 10 meq via ORAL
  Filled 2017-01-27 (×2): qty 1

## 2017-01-27 MED ORDER — ACETAMINOPHEN 650 MG RE SUPP: 650.0000 mg | Freq: Four times a day (QID) | RECTAL | Status: DC | PRN

## 2017-01-27 MED ORDER — ASPIRIN EC 81 MG PO TBEC
81.0000 mg | DELAYED_RELEASE_TABLET | Freq: Every day | ORAL | Status: DC
  Administered 2017-01-27 – 2017-01-31 (×5): 81 mg via ORAL
  Filled 2017-01-27 (×5): qty 1

## 2017-01-27 MED ORDER — ISOSORBIDE MONONITRATE 20 MG PO TABS
10.0000 mg | ORAL_TABLET | Freq: Two times a day (BID) | ORAL | Status: DC
  Administered 2017-01-27 – 2017-01-29 (×5): 10 mg via ORAL
  Filled 2017-01-27 (×7): qty 1

## 2017-01-27 NOTE — Consult Note (Signed)
Cardiology Consultation:   Patient ID: Gerald Nelson; [id number]; [birth year]   Admit date: 01/26/2017 Date of Consult: 01/27/2017  Primary Care Provider: [name], MD Primary Cardiologist: Dr. Harrington Challenger  Primary Electrophysiologist:  none   Patient Profile:   Gerald Nelson is a 66 y.o. male with a hx of systolic CHF, severe MR/TR, pulmonary HTN, CVA, CKD, schizophrenia, HTN, and tobacco abuse who is being seen today for the evaluation of acute CHF at the request of Dr. Sherral Hammers.  History of Present Illness:   [name] was admitted to [facility] 7/2-01/06/17 for newly diagnosed acute systolic CHF. Echo showed LVEF 15%, severe global hypokinesis, moderate LVH, coarse trabeculation of the LV apex with numerous false tendinae of the left ventricle, very stagnant blood flow at the LV apex with smoke but no obvious LV thrombus - Definity contrast was given, again, noting stagnant apical blood flow - this could suggest  recent thrombus and certainly high risk for apical thrombus  formation. Other findings include aortic sclerosis with mild AI, moderate to severe MR, moderate LAE, mild RAE, moderate to severe TR, moderate to severe pulmonary hypertension (RVSP 73 mmHg), dilated IVC, trivial posterior pericardial effusion - tamponade physiology could be excluded given the degree of pulmonary hypertension.  He was seen by Dr. Harrington Challenger during this admission who felt that he needed a L/RHC, but patient left AMA. When he left, he did not pick up any medications due to lack of money and only continued to take ASA. He lives in a boarding house with two room mates and lives off of social security. He does have medicare. The only medication he takes regularly is haldol given to him by his psychiatrist. His PCP is [name]. He has progressively become more SOB and dyspneic since discharge. He has LE edema, orthopnea and PND. No chest pain, dizziness or syncope. No blood in stool or urine.   He eventually re presented to  [facility].   ED Course:In the ER patient was found to have markedly elevated blood pressure and markedly SOB. Lab work showed mildly elevated troponin with BNP of 3600. EKG showed sinus rhythm with incomplete LBBB. Patient was started on nitroglycerin infusion Lasix and heparin.   He Is breathing much better after IV lasix. He is willing to proceed with other testing now and agrees to stay in the hospital.    Past Medical History:  Diagnosis Date  . COPD (chronic obstructive pulmonary disease) ([place])   . Hernia, hiatal   . Hypertension   . Schizophrenia [facility])     Past Surgical History:  Procedure Laterality Date  . PENILE PROSTHESIS IMPLANT       Inpatient Medications: Scheduled Meds: . aspirin EC  81 mg Oral Daily  . furosemide  60 mg Intravenous Q12H  . [COMPLETED] nicotine  21 mg Transdermal Once  . potassium chloride  10 mEq Oral Daily   Continuous Infusions: . heparin 850 Units/hr (01/27/17 1045)  . nitroGLYCERIN 30 mcg/min (01/27/17 0800)   PRN Meds: acetaminophen **OR** acetaminophen, ondansetron **OR** ondansetron (ZOFRAN) IV  Allergies:    Allergies  Allergen Reactions  . Sulfa Antibiotics Nausea Only    Social History:   Social History   Social History  . Marital status: Single    Spouse name: N/A  . Number of children: N/A  . Years of education: N/A   Occupational History  . Not on file.   Social History Main Topics  . Smoking status: Current Every Day Smoker  Packs/day: 1.50    Types: Cigarettes  . Smokeless tobacco: Never Used  . Alcohol use No  . Drug use: No  . Sexual activity: Not on file   Other Topics Concern  . Not on file   Social History Narrative  . No narrative on file    Family History:   The patient's family history includes Hypertension in his father and mother.  ROS:  Please see the history of present illness.  ROS  All other ROS reviewed and negative.     Physical Exam/Data:   Vitals:   01/27/17 0500 01/27/17 0530  01/27/17 0800 01/27/17 0803  BP: 133/87 (!) 130/99    Pulse:  88    Resp: 17 (!) 28    Temp:    97.6 F (36.4 C)  TempSrc:    Oral  SpO2:  100% 100%   Weight:      Height:        Intake/Output Summary (Last 24 hours) at 01/27/17 1059 Last data filed at 01/27/17 1032  Gross per 24 hour  Intake           483.65 ml  Output             3550 ml  Net         -3066.35 ml   Filed Weights   01/26/17 2318 01/27/17 0200  Weight: 145 lb 15.1 oz (66.2 kg) 151 lb 14.4 oz (68.9 kg)   Body mass index is 21.79 kg/m.  General:  Thin 66 yo , in no acute distress, chronically ill appearing, disheveled AA male  HEENT: normal Lymph: no adenopathy Neck: JVP is increased  Endocrine:  No thryomegaly Vascular: No carotid bruits; FA pulses 2+ bilaterally without bruits  Cardiac:  normal S1, S2; RRR;+ murmur  Lungs:  Mild rales    Abd: soft, nontender, no hepatomegaly  Ext: 1++ bilateral LE edema Musculoskeletal:  No deformities, BUE and BLE strength normal and equal Skin: warm and dry  Neuro:  CNs 2-12 intact, no focal abnormalities noted Psych:  Normal affect   EKG:  The EKG was personally reviewed and demonstrates: NSR, biatrial enlargement, ILBBB, prolonged QT   Telemetry:  Telemetry was personally reviewed and demonstrates: sinus with PVCs  Relevant CV Studies: 2D ECHO: 01/04/2017 LV EF: 15% Study Conclusions - Left ventricle: The cavity size was normal. Wall thickness was   increased in a pattern of moderate LVH. There was severe focal   basal hypertrophy of the septum. Coarse trabeculation at the LV   apex with multiple false tendinae. Does not meet diagnostic   criteria for non-compaction. LV apical smoke noted, but no   thrombus. Systolic function was severely reduced. The estimated   ejection fraction was 15%. Diffuse hypokinesis. The study is not   technically sufficient to allow evaluation of LV diastolic   function. - Aortic valve: Trileaflet. Sclerosis without stenosis.  There was   mild regurgitation. - Mitral valve: Mildly thickened leaflets . There was moderate to   severe regurgitation. - Left atrium: Moderately dilated. - Right ventricle: The cavity size was normal. Systolic function   was low normal. - Right atrium: The atrium was mildly dilated. - Atrial septum: No defect or patent foramen ovale was identified. - Tricuspid valve: There was moderate to severe regurgitation. - Pulmonary arteries: Dilated. PA peak pressure: 73 mm Hg (S). - Inferior vena cava: The vessel was dilated. The respirophasic   diameter changes were blunted (< 50%), consistent with elevated  central venous pressure. - Pericardium, extracardiac: A trivial pericardial effusion was   identified posterior to the heart. Tamponade physiology cannot be   evaluated in the setting of severe pulmonary hypertension.   Clinical correlation is always advised. Impressions - LVEF 15%, severe global hypokinesis, moderate LVH, coarse   trabeculation of the LV apex with numerous false tendinae of the   left ventricle, very stagnant blood flow at the LV apex with   smoke but no obvious LV thrombus - Definity contrast was given,   again, noting stagnant apical blood flow - this could suggest   recent thrombus and certainly high risk for apical thrombus   formation. Other findings include aortic sclerosis with mild AI,   moderate to severe MR, moderate LAE, mild RAE, moderate to severe   TR, moderate to severe pulmonary hypertension (RVSP 73 mmHg),   dilated IVC, trivial posterior pericardial effusion - tamponade   physiology cannot be excluded given the degree of pulmonary   hypertension.  Laboratory Data:  Chemistry Recent Labs Lab 01/26/17 2152 01/27/17 0744  NA 139 140  K 4.1 4.9  CL 106 104  CO2 26 24  GLUCOSE 134* 85  BUN 22* 22*  CREATININE 1.35* 1.40*  CALCIUM 8.9 8.7*  GFRNONAA 54* 52*  GFRAA >60 60*  ANIONGAP 7 12     Recent Labs Lab 01/27/17 0744  PROT 6.0*    ALBUMIN 3.0*  AST 43*  ALT 27  ALKPHOS 68  BILITOT 1.8*   Hematology Recent Labs Lab 01/26/17 2152 01/27/17 0744  WBC 7.5 7.5  RBC 4.41 4.54  HGB 13.8 14.2  HCT 39.8 41.2  MCV 90.2 90.7  MCH 31.3 31.3  MCHC 34.7 34.5  RDW 13.9 14.0  PLT 224 195   Cardiac EnzymesNo results for input(s): TROPONINI in the last 168 hours.  Recent Labs Lab 01/26/17 2206  TROPIPOC 0.10*    BNP Recent Labs Lab 01/26/17 2152  BNP 3,641.0*    DDimer No results for input(s): DDIMER in the last 168 hours.  Radiology/Studies:  Dg Chest 2 View  Result Date: 01/26/2017 CLINICAL DATA:  Shortness of breath for 2 weeks EXAM: CHEST  2 VIEW COMPARISON:  01/03/2017 FINDINGS: Cardiac shadow remains enlarged. Aortic calcifications are seen. The lungs are well aerated bilaterally. Some very minimal atelectatic changes are noted on the left. No sizable effusion is seen. Degenerative changes of the thoracic spine are noted. IMPRESSION: Minimal persistent changes in the left base. No other focal abnormality is noted. Electronically Signed   By: Inez Catalina M.D.   On: 01/26/2017 22:25    Assessment and Plan:   Acute on chronic systolic CHF with severe MR/TR and pulmonary HTN: diuresing well on IV lasix 60mg  BID. Currently on Lopressor 25mg  BID. Will switch this to Toprol XL 50mg  daily given cardiomyopathy. Would add ACE/ARB or ideally Entresto when euvolemic and creat stabilized. Would plan on L/RHC once euvolemic to assess coronary arteries and valvular heart disease.   LV apical smoke: found on echo during last admission. High risk for thrombus formation  HTN urgency: BP now well controlled   Elevated troponin: 0.09--> 0.08, likely demand ischemia in the setting of acute CHF and hypertensive urgency. Currently on IV heparin for ACS, which can likely be discontinued (unless necessary for low flow in LV apex)  CKD: creat ~1.4, which seems to be around his baseline. Continue to monitor with  diuresis.  Tobacco abuse: counseled on the importance of quitting   Signed, Angelena Form,  PA-C  01/27/2017 10:59 AM   Pt seen and examined   I agree with findings as noted by Kathlene November above   He is familiar to me as I just saw him a few weeks ago  Left AMA Pt is a 66 yo with cardiomyopathy  Presents again with SOB and with fluiid overload  He admts to not taking meds  Pt has responded to lasix and is diuresing  He is more comfortable    On exam  Still evid of volume increase  JVP increased  Cardiac RRR  No S3  Lungs with mild raltes and wheezes Abd benign  extwith 1+ edema   Recomm:  Agree with continued diuresis with lasix   Pt also on b blocker  Would add ACE I   Will review echo for apical smoke  I do not think with noncompliance he is a candidate for anticoagulation  If compliant would benefit with L/ R heart cath  He is not ready at this point. Follow renal function    Dorris Carnes

## 2017-01-27 NOTE — Progress Notes (Signed)
PROGRESS NOTE    Gerald Nelson  WPY:[id number] DOB: 25-Dec-1950 DOA: 01/26/2017 PCP: [name], MD   Brief Narrative:  66 y.o. BM PMHx TIA, Schizophrenia, Tobacco Abuse COPD, HTN, Chronic Systolic CHF, Medical Noncompliance.Recently admitted 3 weeks ago for shortness of breath and was found to have CHF with 2-D echo showing EF of 15%.  Presents to the ER with shortness of breath. During last day patient left AGAINST MEDICAL ADVICE. Patient states he has not been taking any medications except for aspirin. Patient's shortness of breath has progressively worsened since discharge increasing on minimal exertion. Denies any chest pain productive cough fever or chills.   ED Course: In the ER patient was found to have markedly elevated blood pressure and patient was found to be short of breath but able to complete sentences. On exam patient has bilateral crackles and elevated JVD. Chest x-ray shows congestion. Lab work show mildly elevated troponin with BNP of 3600. EKG shows sinus rhythm with incomplete left bundle branch block. Patient was started on nitroglycerin infusion Lasix and heparin. Patient is being admitted for acute on chronic systolic heart failure.    Subjective: 7/26 A/O 4, negative CPK negative SOB, negative N/V negative abdominal pain. States understands he has been noncompliant with his medication, and leaving AMA are the cause of his current problems. States continues to smoke.     Assessment & Plan:   Principal Problem:   Acute respiratory failure with hypoxia (HCC) Active Problems:   Schizophrenia (HCC)   CKD (chronic kidney disease), stage III   Acute systolic CHF (congestive heart failure) (HCC)   CHF (congestive heart failure) (HCC)   Hypertensive urgency  Acute respiratory failure with hypoxia -Multifactorial to include acute on chronic systolic CHF, hypertensive urgency, COPD exacerbation?. -Titrate O2 to maintain SPO2 89-93% -Xopenex QID - Mucinex DM  BID -We will hold on starting antibiotics at this time. Negative fever, negative leukocytosis. However monitor closely  Acute on Chronic systolic CHF -Patient does not no base weight nor does he weigh himself daily. -Lasix 60 mg BID -Metoprolol 25 mg BID -Patient with acute on CT stage III hold on starting ACEI/ARB -Isosorbide mononitrate 10 mg BID -Wean NTG drip to off as tolerated -Strict INO -Daily weight Filed Weights   01/26/17 2318 01/27/17 0200  Weight: 145 lb 15.1 oz (66.2 kg) 151 lb 14.4 oz (68.9 kg)  -cycle cardiac markers -Cardiac consulted  Hypertensive urgency -See CHF  Pulmonary hypertension -Severe pulmonary hypertension by previous echocardiogram see results below. -See CHF  Elevated troponin -Most likely secondary to CHF decompensation  Acute on CK D stage III(baseline Cr 1.3) -Hold ACEI/ARB -Hold nephrotoxic medication -Monitor creatinine closely Lab Results  Component Value Date   CREATININE 1.40 (H) 01/27/2017   CREATININE 1.35 (H) 01/26/2017   CREATININE 1.51 (H) 01/05/2017   Hx TIA -Aspirin  Schizophrenia undifferentiated -Haldol monthly. Will have to conduct EMR searched to determine when last dose   Tobacco abuse -Counseled all sequelae of continuing to smoke to include MI, stroke, death -Nicotine patch     DVT prophylaxis: Heparin drip Code Status: Full Family Communication: None Disposition Plan: TBD   Consultants:      Procedures/Significant Events:  7/3 Echocardiogram:- Left ventricle: moderate LVH. There was severe focal  basal hypertrophy of the septum. Coarse trabeculation at the LV  apex with multiple false tendinae. -LVEF=15%. Diffuse hypokinesis.  - Mitral valve: moderate to severe regurgitation. - Left atrium: Moderately dilated. - Tricuspid valve: moderate to severe regurgitation. -  Pulmonary arteries: Dilated. PA peak pressure: 73 mm Hg (S).   VENTILATOR SETTINGS:    Cultures 7/26 sputum  pending   Antimicrobials: Anti-infectives    None        LINES / TUBES:  None    Continuous Infusions: . heparin 750 Units/hr (01/27/17 0500)  . nitroGLYCERIN 30 mcg/min (01/27/17 0500)     Objective: Vitals:   01/27/17 0416 01/27/17 0430 01/27/17 0500 01/27/17 0530  BP: (!) 187/145 (!) 145/100 133/87 (!) 130/99  Pulse: 93 87  88  Resp: (!) 23 20 17  (!) 28  Temp: (!) 97.5 F (36.4 C)     TempSrc:      SpO2: 97% 99%  100%  Weight:      Height:        Intake/Output Summary (Last 24 hours) at 01/27/17 0739 Last data filed at 01/27/17 0500  Gross per 24 hour  Intake           194.15 ml  Output             2000 ml  Net         -1805.85 ml   Filed Weights   01/26/17 2318 01/27/17 0200  Weight: 145 lb 15.1 oz (66.2 kg) 151 lb 14.4 oz (68.9 kg)    Examination:  General: A/O 4, No acute respiratory distress, cachectic, poorly kept Eyes: negative scleral hemorrhage, negative anisocoria, negative icterus ENT: Negative Runny nose, negative gingival bleeding, Neck:  Negative scars, masses, torticollis, lymphadenopathy, JVD Lungs: Positive diffuse expiratory wheezing, negative crackles  Cardiovascular: Tachycardic, Regular rhythm without murmur gallop or rub normal S1 and S2 Abdomen: negative abdominal pain, nondistended, positive soft, bowel sounds, no rebound, no ascites, no appreciable mass Extremities: No significant cyanosis, clubbing, or edema bilateral lower extremities Skin: Negative rashes, lesions, ulcers Psychiatric:  Negative depression, negative anxiety, negative fatigue, negative mania  Central nervous system:  Cranial nerves II through XII intact, tongue/uvula midline, all extremities muscle strength 5/5, sensation intact throughout, negative dysarthria, negative expressive aphasia, negative receptive aphasia.  .     Data Reviewed: Care during the described time interval was provided by me .  I have reviewed this patient's available data, including  medical history, events of note, physical examination, and all test results as part of my evaluation. I have personally reviewed and interpreted all radiology studies.  CBC:  Recent Labs Lab 01/26/17 2152  WBC 7.5  HGB 13.8  HCT 39.8  MCV 90.2  PLT 654   Basic Metabolic Panel:  Recent Labs Lab 01/26/17 2152 01/27/17 0243  NA 139  --   K 4.1  --   CL 106  --   CO2 26  --   GLUCOSE 134*  --   BUN 22*  --   CREATININE 1.35*  --   CALCIUM 8.9  --   MG  --  1.7   GFR: Estimated Creatinine Clearance: 53.9 mL/min (A) (by C-G formula based on SCr of 1.35 mg/dL (H)). Liver Function Tests: No results for input(s): AST, ALT, ALKPHOS, BILITOT, PROT, ALBUMIN in the last 168 hours. No results for input(s): LIPASE, AMYLASE in the last 168 hours. No results for input(s): AMMONIA in the last 168 hours. Coagulation Profile: No results for input(s): INR, PROTIME in the last 168 hours. Cardiac Enzymes: No results for input(s): CKTOTAL, CKMB, CKMBINDEX, TROPONINI in the last 168 hours. BNP (last 3 results) No results for input(s): PROBNP in the last 8760 hours. HbA1C: No results for input(s): HGBA1C  in the last 72 hours. CBG: No results for input(s): GLUCAP in the last 168 hours. Lipid Profile: No results for input(s): CHOL, HDL, LDLCALC, TRIG, CHOLHDL, LDLDIRECT in the last 72 hours. Thyroid Function Tests:  Recent Labs  01/27/17 0243  TSH 3.096   Anemia Panel: No results for input(s): VITAMINB12, FOLATE, FERRITIN, TIBC, IRON, RETICCTPCT in the last 72 hours. Urine analysis:    Component Value Date/Time   COLORURINE YELLOW 12/11/2013 [place] 12/11/2013 1733   LABSPEC 1.014 12/11/2013 1733   PHURINE 5.5 12/11/2013 1733   GLUCOSEU NEGATIVE 12/11/2013 1733   HGBUR NEGATIVE 12/11/2013 1733   BILIRUBINUR NEGATIVE 12/11/2013 1733   KETONESUR NEGATIVE 12/11/2013 1733   PROTEINUR NEGATIVE 12/11/2013 1733   UROBILINOGEN 1.0 12/11/2013 1733   NITRITE NEGATIVE  12/11/2013 1733   LEUKOCYTESUR NEGATIVE 12/11/2013 1733   Sepsis Labs: @LABRCNTIP (procalcitonin:4,lacticidven:4)  )No results found for this or any previous visit (from the past 240 hour(s)).       Radiology Studies: Dg Chest 2 View  Result Date: 01/26/2017 CLINICAL DATA:  Shortness of breath for 2 weeks EXAM: CHEST  2 VIEW COMPARISON:  01/03/2017 FINDINGS: Cardiac shadow remains enlarged. Aortic calcifications are seen. The lungs are well aerated bilaterally. Some very minimal atelectatic changes are noted on the left. No sizable effusion is seen. Degenerative changes of the thoracic spine are noted. IMPRESSION: Minimal persistent changes in the left base. No other focal abnormality is noted. Electronically Signed   By: Inez Catalina M.D.   On: 01/26/2017 22:25        Scheduled Meds: . aspirin EC  81 mg Oral Daily  . furosemide  60 mg Intravenous Q12H  . nicotine  21 mg Transdermal Once  . potassium chloride  10 mEq Oral Daily   Continuous Infusions: . heparin 750 Units/hr (01/27/17 0500)  . nitroGLYCERIN 30 mcg/min (01/27/17 0500)     LOS: 1 day    Time spent: 40 minutes    Lyanna Blystone, Geraldo Docker, MD [facility] Pager [phone]   If 7PM-7AM, please contact night-coverage [email or web address] Password [facility] 01/27/2017, 7:39 AM

## 2017-01-27 NOTE — H&P (Signed)
History and Physical    Gerald Nelson KDT:[id number] DOB: Mar 22, 1951 DOA: 01/26/2017  PCP: [name], MD  Patient coming from: Home.  Chief Complaint: Shortness of breath.  HPI: Gerald Nelson is a 66 y.o. male with history of TIA, schizophrenia, COPD who was recently admitted 3 weeks ago for shortness of breath and was found to have CHF with 2-D echo showing EF of 15% presents to the ER with shortness of breath. During last day patient left AGAINST MEDICAL ADVICE. Patient states he has not been taking any medications except for aspirin. Patient's shortness of breath has progressively worsened since discharge increasing on minimal exertion. Denies any chest pain productive cough fever or chills.   ED Course: In the ER patient was found to have markedly elevated blood pressure and patient was found to be short of breath but able to complete sentences. On exam patient has bilateral crackles and elevated JVD. Chest x-ray shows congestion. Lab work show mildly elevated troponin with BNP of 3600. EKG shows sinus rhythm with incomplete left bundle branch block. Patient was started on nitroglycerin infusion Lasix and heparin. Patient is being admitted for acute systolic heart failure.  Review of Systems: As per HPI, rest all negative.   Past Medical History:  Diagnosis Date  . COPD (chronic obstructive pulmonary disease) ([place])   . Hernia, hiatal   . Hypertension   . Schizophrenia [facility])     Past Surgical History:  Procedure Laterality Date  . PENILE PROSTHESIS IMPLANT       reports that he has been smoking Cigarettes.  He has been smoking about 1.50 packs per day. He has never used smokeless tobacco. He reports that he does not drink alcohol or use drugs.  Allergies  Allergen Reactions  . Sulfa Antibiotics Nausea Only    Family History  Problem Relation Age of Onset  . Hypertension Mother   . Hypertension Father     Prior to Admission medications   Medication Sig Start Date  End Date Taking? Authorizing Provider  haloperidol decanoate (HALDOL DECANOATE) 100 MG/ML injection Inject 100 mg into the muscle every 30 (thirty) days.  01/07/17  Yes [provider]  clopidogrel (PLAVIX) 75 MG tablet Take 1 tablet (75 mg total) by mouth daily with breakfast. Patient not taking: Reported on 01/26/2017 12/12/13   Hosie Poisson, MD  esomeprazole ([place]) 20 MG packet Take 20 mg by mouth daily before breakfast. Patient not taking: Reported on 01/26/2017 12/05/13   Kem Parkinson, PA-C    Physical Exam: Vitals:   01/27/17 0030 01/27/17 0100 01/27/17 0115 01/27/17 0124  BP: (!) 167/117 (!) 163/120 (!) 176/131   Pulse: 91 94  (!) 101  Resp: (!) 31 (!) 31 (!) 26 18  Temp:      TempSrc:      SpO2: 99% 99%  98%  Weight:      Height:          Constitutional: Moderately built and nourished. Vitals:   01/27/17 0030 01/27/17 0100 01/27/17 0115 01/27/17 0124  BP: (!) 167/117 (!) 163/120 (!) 176/131   Pulse: 91 94  (!) 101  Resp: (!) 31 (!) 31 (!) 26 18  Temp:      TempSrc:      SpO2: 99% 99%  98%  Weight:      Height:       Eyes: Anicteric no pallor. ENMT: No discharge from the ears eyes nose and mouth. Neck: JVD elevated no mass felt. Respiratory: Bilateral  basilar crackles nor rhonchi. Cardiovascular: S1 and S2 heard no murmurs appreciated. Abdomen: Soft nontender bowel sounds present. Musculoskeletal: No edema. No joint effusion. Skin: No rash. Skin appears warm. Neurologic: Alert awake oriented to time place and person. Moves all extremities 5 x 5. Psychiatric: Appears normal. Normal affect.   Labs on Admission: I have personally reviewed following labs and imaging studies  CBC:  Recent Labs Lab 01/26/17 2152  WBC 7.5  HGB 13.8  HCT 39.8  MCV 90.2  PLT 569   Basic Metabolic Panel:  Recent Labs Lab 01/26/17 2152  NA 139  K 4.1  CL 106  CO2 26  GLUCOSE 134*  BUN 22*  CREATININE 1.35*  CALCIUM 8.9   GFR: Estimated Creatinine  Clearance: 51.8 mL/min (A) (by C-G formula based on SCr of 1.35 mg/dL (H)). Liver Function Tests: No results for input(s): AST, ALT, ALKPHOS, BILITOT, PROT, ALBUMIN in the last 168 hours. No results for input(s): LIPASE, AMYLASE in the last 168 hours. No results for input(s): AMMONIA in the last 168 hours. Coagulation Profile: No results for input(s): INR, PROTIME in the last 168 hours. Cardiac Enzymes: No results for input(s): CKTOTAL, CKMB, CKMBINDEX, TROPONINI in the last 168 hours. BNP (last 3 results) No results for input(s): PROBNP in the last 8760 hours. HbA1C: No results for input(s): HGBA1C in the last 72 hours. CBG: No results for input(s): GLUCAP in the last 168 hours. Lipid Profile: No results for input(s): CHOL, HDL, LDLCALC, TRIG, CHOLHDL, LDLDIRECT in the last 72 hours. Thyroid Function Tests: No results for input(s): TSH, T4TOTAL, FREET4, T3FREE, THYROIDAB in the last 72 hours. Anemia Panel: No results for input(s): VITAMINB12, FOLATE, FERRITIN, TIBC, IRON, RETICCTPCT in the last 72 hours. Urine analysis:    Component Value Date/Time   COLORURINE YELLOW 12/11/2013 [place] 12/11/2013 1733   LABSPEC 1.014 12/11/2013 1733   PHURINE 5.5 12/11/2013 1733   GLUCOSEU NEGATIVE 12/11/2013 1733   HGBUR NEGATIVE 12/11/2013 1733   BILIRUBINUR NEGATIVE 12/11/2013 1733   KETONESUR NEGATIVE 12/11/2013 1733   PROTEINUR NEGATIVE 12/11/2013 1733   UROBILINOGEN 1.0 12/11/2013 1733   NITRITE NEGATIVE 12/11/2013 1733   LEUKOCYTESUR NEGATIVE 12/11/2013 1733   Sepsis Labs: @LABRCNTIP (procalcitonin:4,lacticidven:4) )No results found for this or any previous visit (from the past 240 hour(s)).   Radiological Exams on Admission: Dg Chest 2 View  Result Date: 01/26/2017 CLINICAL DATA:  Shortness of breath for 2 weeks EXAM: CHEST  2 VIEW COMPARISON:  01/03/2017 FINDINGS: Cardiac shadow remains enlarged. Aortic calcifications are seen. The lungs are well aerated  bilaterally. Some very minimal atelectatic changes are noted on the left. No sizable effusion is seen. Degenerative changes of the thoracic spine are noted. IMPRESSION: Minimal persistent changes in the left base. No other focal abnormality is noted. Electronically Signed   By: Inez Catalina M.D.   On: 01/26/2017 22:25    EKG: Independently reviewed. Sinus rhythm with incomplete right bundle branch block.  Assessment/Plan Principal Problem:   Acute respiratory failure with hypoxia (HCC) Active Problems:   Schizophrenia (HCC)   CKD (chronic kidney disease), stage III   Acute systolic CHF (congestive heart failure) (HCC)   CHF (congestive heart failure) (HCC)   Hypertensive urgency    1. Acute respiratory hypoxia secondary to acute systolic heart failure last 2-D echo done this month was showing EF of 15% with severe MR - patient has received 40 mg IV Lasix in the ER. I have placed patient on Lasix 60 mg every  12. Closely follow intake output and metabolic panel. Cycle cardiac markers. I have requested cardiology consult. Not on ACE inhibitor or ARB due to renal failure. 2. Hypertensive urgency - patient is placed on nitroglycerin infusion for now. Patient also on IV Lasix. Once volume status improves may need to start on Coreg. 3. Elevated troponin likely from CHF decompensation - we'll cycle cardiac markers. 4. Chronic kidney disease stage III - creatinine appears to be at baseline. Follow metabolic panel closely since patient is on large doses of Lasix. 5. History of TIA and aspirin. 6. History of schizophrenia on monthly Haldol.  I have reviewed patient's old chart and labs.   DVT prophylaxis: Heparin. Code Status: Full code.  Family Communication: Discussed with patient.  Disposition Plan: Home.  Consults called: Cardiology.  Admission status: Inpatient.    Rise Patience MD [facility] Pager [phone].  If 7PM-7AM, please contact  night-coverage [email or web address] Password [email or web address]  01/27/2017, 2:15 AM

## 2017-01-27 NOTE — Progress Notes (Signed)
ANTICOAGULATION CONSULT NOTE - Follow Up Consult  Pharmacy Consult for Heparin Indication: chest pain/ACS  Allergies  Allergen Reactions  . Sulfa Antibiotics Nausea Only    Patient Measurements: Height: 5\' 10"  (177.8 cm) Weight: 151 lb 14.4 oz (68.9 kg) IBW/kg (Calculated) : 73 Heparin Dosing Weight:  68.9 kg  Vital Signs: Temp: 97.6 F (36.4 C) (07/26 0803) Temp Source: Oral (07/26 0803) BP: 130/99 (07/26 0530) Pulse Rate: 88 (07/26 0530)  Labs:  Recent Labs  01/26/17 2152 01/27/17 0744  HGB 13.8 14.2  HCT 39.8 41.2  PLT 224 195  HEPARINUNFRC  --  0.12*  CREATININE 1.35* 1.40*    Estimated Creatinine Clearance: 51.9 mL/min (A) (by C-G formula based on SCr of 1.4 mg/dL (H)).  Assessment:  CC/HPI:  66yo male c/o non-productive cough and SOB, initial istat troponin elevated.  Anticoag: Elevated troponins. HL 0.12 low. CBC WNL  Goal of Therapy:  Heparin level 0.3-0.7 units/ml Monitor platelets by anticoagulation protocol: Yes   Plan:  Heparin 2000 unit IV bolus Increase infusion to 850 units/hr Recheck HL in 6-8 hrs Daily HL and CBC   Gerald Nelson, PharmD, BCPS Clinical Staff Pharmacist Pager [phone]  Gerald Nelson 01/27/2017,10:33 AM

## 2017-01-27 NOTE — Progress Notes (Signed)
[place] for Heparin Indication: chest pain/ACS  Allergies  Allergen Reactions  . Sulfa Antibiotics Nausea Only    Patient Measurements: Height: 5\' 10"  (177.8 cm) Weight: 151 lb 14.4 oz (68.9 kg) IBW/kg (Calculated) : 73 Heparin Dosing Weight:  68.9 kg  Vital Signs: Temp: 98.8 F (37.1 C) (07/26 1919) Temp Source: Oral (07/26 1919) BP: 130/98 (07/26 1919) Pulse Rate: 86 (07/26 1919)  Labs:  Recent Labs  01/26/17 2152 01/27/17 0744 01/27/17 1811  HGB 13.8 14.2  --   HCT 39.8 41.2  --   PLT 224 195  --   HEPARINUNFRC  --  0.12* <0.10*  CREATININE 1.35* 1.40*  --     Estimated Creatinine Clearance: 51.9 mL/min (A) (by C-G formula based on SCr of 1.4 mg/dL (H)).  Assessment:  CC/HPI:  66yo male c/o non-productive cough and SOB, initial istat troponin elevated.  HL undetectable. Infusion line is okay; patient without bleeding. Will rebolus and increase rate.  Goal of Therapy:  Heparin level 0.3-0.7 units/ml Monitor platelets by anticoagulation protocol: Yes   Plan:  Rebolus with another 2000 units of heparin Increase heparin to 1150 units per hour Recheck heparin level in 6 hours Daily HL and CBC  Dierdre Harness, PharmD Clinical Pharmacist [phone] (Pager) 01/27/2017 8:26 PM

## 2017-01-28 LAB — CBC
HCT: 39.7 % (ref 39.0–52.0)
Hemoglobin: 13.7 g/dL (ref 13.0–17.0)
MCH: 31.1 pg (ref 26.0–34.0)
MCHC: 34.5 g/dL (ref 30.0–36.0)
MCV: 90.2 fL (ref 78.0–100.0)
PLATELETS: 214 10*3/uL (ref 150–400)
RBC: 4.4 MIL/uL (ref 4.22–5.81)
RDW: 13.8 % (ref 11.5–15.5)
WBC: 7.3 10*3/uL (ref 4.0–10.5)

## 2017-01-28 LAB — BASIC METABOLIC PANEL
ANION GAP: 10 (ref 5–15)
BUN: 21 mg/dL — ABNORMAL HIGH (ref 6–20)
CALCIUM: 8.9 mg/dL (ref 8.9–10.3)
CO2: 28 mmol/L (ref 22–32)
Chloride: 101 mmol/L (ref 101–111)
Creatinine, Ser: 1.55 mg/dL — ABNORMAL HIGH (ref 0.61–1.24)
GFR calc Af Amer: 53 mL/min — ABNORMAL LOW (ref >60)
GFR, EST NON AFRICAN AMERICAN: 46 mL/min — AB (ref >60)
GLUCOSE: 102 mg/dL — AB (ref 65–99)
Potassium: 4.1 mmol/L (ref 3.5–5.1)
Sodium: 139 mmol/L (ref 135–145)

## 2017-01-28 LAB — HEPARIN LEVEL (UNFRACTIONATED)
HEPARIN UNFRACTIONATED: 0.4 [IU]/mL (ref 0.30–0.70)
Heparin Unfractionated: 0.34 IU/mL (ref 0.30–0.70)

## 2017-01-28 MED ORDER — FUROSEMIDE 40 MG PO TABS: 60.0000 mg | ORAL_TABLET | Freq: Every day | ORAL | Status: DC

## 2017-01-28 MED ORDER — FUROSEMIDE 10 MG/ML IJ SOLN
60.0000 mg | Freq: Every day | INTRAMUSCULAR | Status: DC
  Administered 2017-01-28: 60 mg via INTRAVENOUS

## 2017-01-28 MED ORDER — FUROSEMIDE 10 MG/ML IJ SOLN
INTRAMUSCULAR | Status: AC
  Administered 2017-01-28: 60 mg via INTRAVENOUS
  Filled 2017-01-28: qty 8

## 2017-01-28 MED ORDER — LEVALBUTEROL HCL 1.25 MG/0.5ML IN NEBU
1.2500 mg | INHALATION_SOLUTION | Freq: Two times a day (BID) | RESPIRATORY_TRACT | Status: DC
  Administered 2017-01-28 – 2017-01-30 (×4): 1.25 mg via RESPIRATORY_TRACT
  Filled 2017-01-28 (×4): qty 0.5

## 2017-01-28 NOTE — Progress Notes (Signed)
PROGRESS NOTE    Gerald Nelson  HRC:[id number] DOB: May 25, 1951 DOA: 01/26/2017 PCP: [name], MD   Brief Narrative:  66 y.o. BM PMHx TIA, Schizophrenia, Tobacco Abuse COPD, HTN, Chronic Systolic CHF, Medical Noncompliance.Recently admitted 3 weeks ago for shortness of breath and was found to have CHF with 2-D echo showing EF of 15%.  Presents to the ER with shortness of breath. During last day patient left AGAINST MEDICAL ADVICE. Patient states he has not been taking any medications except for aspirin. Patient's shortness of breath has progressively worsened since discharge increasing on minimal exertion. Denies any chest pain productive cough fever or chills.   ED Course: In the ER patient was found to have markedly elevated blood pressure and patient was found to be short of breath but able to complete sentences. On exam patient has bilateral crackles and elevated JVD. Chest x-ray shows congestion. Lab work show mildly elevated troponin with BNP of 3600. EKG shows sinus rhythm with incomplete left bundle branch block. Patient was started on nitroglycerin infusion Lasix and heparin. Patient is being admitted for acute on chronic systolic heart failure.    Subjective: 7/27 A/O 4, negative CP negative SOB, negative N/V negative abdominal pain.          Assessment & Plan:   Principal Problem:   Acute respiratory failure with hypoxia (HCC) Active Problems:   Schizophrenia (HCC)   CKD (chronic kidney disease), stage III   Acute systolic CHF (congestive heart failure) (HCC)   CHF (congestive heart failure) (HCC)   Hypertensive urgency  Acute respiratory failure with hypoxia -Multifactorial to include acute on chronic systolic CHF, hypertensive urgency, COPD exacerbation?. -Titrate O2 to maintain SPO2 89-93% -Xopenex QID - Mucinex DM BID -We will hold on starting antibiotics at this time. Negative fever, negative leukocytosis. However monitor closely  Acute on Chronic  systolic CHF -Patient does not know his base weight nor does he weigh himself daily. -Lasix 60 mg BID -Metoprolol 25 mg BID -Patient with acute on CT stage III hold on starting ACEI/ARB -Isosorbide mononitrate 10 mg BID -Wean NTG drip to off as tolerated -Strict INO since admission -6.4 L -Daily weight Filed Weights   01/26/17 2318 01/27/17 0200 01/28/17 0439  Weight: 145 lb 15.1 oz (66.2 kg) 151 lb 14.4 oz (68.9 kg) 141 lb 8.6 oz (64.2 kg)  -cycle cardiac markers -Cardiac consulted  Hypertensive urgency -See CHF  Pulmonary hypertension -Severe pulmonary hypertension by previous echocardiogram see results below. -See CHF  Elevated troponin -Most likely secondary to CHF decompensation  Acute on CKD stage III(baseline Cr 1.3) -Hold ACEI/ARB -Hold nephrotoxic medication -Monitor creatinine closely Lab Results  Component Value Date   CREATININE 1.55 (H) 01/28/2017   CREATININE 1.40 (H) 01/27/2017   CREATININE 1.35 (H) 01/26/2017   Hx TIA -Aspirin  Schizophrenia undifferentiated -7/27 psychiatry consulted for medication   Tobacco abuse -Counseled all sequelae of continuing to smoke to include MI, stroke, death -Nicotine patch     DVT prophylaxis: Heparin drip Code Status: Full Family Communication: None Disposition Plan: TBD   Consultants:      Procedures/Significant Events:  7/3 Echocardiogram:- Left ventricle: moderate LVH. There was severe focal  basal hypertrophy of the septum. Coarse trabeculation at the LV  apex with multiple false tendinae. -LVEF=15%. Diffuse hypokinesis.  - Mitral valve: moderate to severe regurgitation. - Left atrium: Moderately dilated. - Tricuspid valve: moderate to severe regurgitation. - Pulmonary arteries: Dilated. PA peak pressure: 73 mm Hg (S).   VENTILATOR SETTINGS:  Cultures 7/26 sputum pending   Antimicrobials: Anti-infectives    None        LINES / TUBES:  None    Continuous Infusions: . heparin  1,150 Units/hr (01/27/17 2039)  . nitroGLYCERIN 30 mcg/min (01/28/17 0133)     Objective: Vitals:   01/28/17 0000 01/28/17 0100 01/28/17 0136 01/28/17 0439  BP: 123/85 (!) 136/97  (!) 145/114  Pulse: 71 77 77   Resp: (!) 29     Temp:    98.3 F (36.8 C)  TempSrc:    Oral  SpO2: 96% 99% 96%   Weight:    141 lb 8.6 oz (64.2 kg)  Height:        Intake/Output Summary (Last 24 hours) at 01/28/17 0725 Last data filed at 01/28/17 8938  Gross per 24 hour  Intake          1746.75 ml  Output             4540 ml  Net         -2793.25 ml   Filed Weights   01/26/17 2318 01/27/17 0200 01/28/17 0439  Weight: 145 lb 15.1 oz (66.2 kg) 151 lb 14.4 oz (68.9 kg) 141 lb 8.6 oz (64.2 kg)    Examination:  General: A/O 4, No acute respiratory distress, cachectic, poorly kept Eyes: negative scleral hemorrhage, negative anisocoria, negative icterus ENT: Negative Runny nose, negative gingival bleeding, Neck:  Negative scars, masses, torticollis, lymphadenopathy, JVD Lungs: Positive diffuse expiratory wheezing, negative crackles  Cardiovascular: Tachycardic, Regular rhythm without murmur gallop or rub normal S1 and S2 Abdomen: negative abdominal pain, nondistended, positive soft, bowel sounds, no rebound, no ascites, no appreciable mass Extremities: No significant cyanosis, clubbing, or edema bilateral lower extremities Skin: Negative rashes, lesions, ulcers Psychiatric:  Negative depression, negative anxiety, negative fatigue, negative mania  Central nervous system:  Cranial nerves II through XII intact, tongue/uvula midline, all extremities muscle strength 5/5, sensation intact throughout, negative dysarthria, negative expressive aphasia, negative receptive aphasia.  .     Data Reviewed: Care during the described time interval was provided by me .  I have reviewed this patient's available data, including medical history, events of note, physical examination, and all test results as part of my  evaluation. I have personally reviewed and interpreted all radiology studies.  CBC:  Recent Labs Lab 01/26/17 2152 01/27/17 0744 01/28/17 0320  WBC 7.5 7.5 7.3  NEUTROABS  --  5.2  --   HGB 13.8 14.2 13.7  HCT 39.8 41.2 39.7  MCV 90.2 90.7 90.2  PLT 224 195 101   Basic Metabolic Panel:  Recent Labs Lab 01/26/17 2152 01/27/17 0243 01/27/17 0744 01/28/17 0320  NA 139  --  140 139  K 4.1  --  4.9 4.1  CL 106  --  104 101  CO2 26  --  24 28  GLUCOSE 134*  --  85 102*  BUN 22*  --  22* 21*  CREATININE 1.35*  --  1.40* 1.55*  CALCIUM 8.9  --  8.7* 8.9  MG  --  1.7  --   --    GFR: Estimated Creatinine Clearance: 43.7 mL/min (A) (by C-G formula based on SCr of 1.55 mg/dL (H)). Liver Function Tests:  Recent Labs Lab 01/27/17 0744  AST 43*  ALT 27  ALKPHOS 68  BILITOT 1.8*  PROT 6.0*  ALBUMIN 3.0*   No results for input(s): LIPASE, AMYLASE in the last 168 hours. No results for input(s): AMMONIA in  the last 168 hours. Coagulation Profile: No results for input(s): INR, PROTIME in the last 168 hours. Cardiac Enzymes: No results for input(s): CKTOTAL, CKMB, CKMBINDEX, TROPONINI in the last 168 hours. BNP (last 3 results) No results for input(s): PROBNP in the last 8760 hours. HbA1C: No results for input(s): HGBA1C in the last 72 hours. CBG: No results for input(s): GLUCAP in the last 168 hours. Lipid Profile: No results for input(s): CHOL, HDL, LDLCALC, TRIG, CHOLHDL, LDLDIRECT in the last 72 hours. Thyroid Function Tests:  Recent Labs  01/27/17 0243  TSH 3.096   Anemia Panel: No results for input(s): VITAMINB12, FOLATE, FERRITIN, TIBC, IRON, RETICCTPCT in the last 72 hours. Urine analysis:    Component Value Date/Time   COLORURINE YELLOW 12/11/2013 1733   APPEARANCEUR CLEAR 12/11/2013 1733   LABSPEC 1.014 12/11/2013 1733   PHURINE 5.5 12/11/2013 1733   GLUCOSEU NEGATIVE 12/11/2013 1733   HGBUR NEGATIVE 12/11/2013 1733   BILIRUBINUR NEGATIVE  12/11/2013 1733   KETONESUR NEGATIVE 12/11/2013 1733   PROTEINUR NEGATIVE 12/11/2013 1733   UROBILINOGEN 1.0 12/11/2013 1733   NITRITE NEGATIVE 12/11/2013 1733   LEUKOCYTESUR NEGATIVE 12/11/2013 1733   Sepsis Labs: @LABRCNTIP (procalcitonin:4,lacticidven:4)  ) Recent Results (from the past 240 hour(s))  MRSA PCR Screening     Status: None   Collection Time: 01/27/17  3:32 AM  Result Value Ref Range Status   MRSA by PCR NEGATIVE NEGATIVE Final    Comment:        The GeneXpert MRSA Assay (FDA approved for NASAL specimens only), is one component of a comprehensive MRSA colonization surveillance program. It is not intended to diagnose MRSA infection nor to guide or monitor treatment for MRSA infections.   Culture, expectorated sputum-assessment     Status: None   Collection Time: 01/27/17 11:25 AM  Result Value Ref Range Status   Specimen Description EXPECTORATED SPUTUM  Final   Special Requests NONE  Final   Sputum evaluation THIS SPECIMEN IS ACCEPTABLE FOR SPUTUM CULTURE  Final   Report Status 01/27/2017 FINAL  Final  Culture, respiratory (NON-Expectorated)     Status: None (Preliminary result)   Collection Time: 01/27/17 11:25 AM  Result Value Ref Range Status   Specimen Description EXPECTORATED SPUTUM  Final   Special Requests NONE Reflexed from [id number]  Final   Gram Stain   Final    MODERATE WBC PRESENT,BOTH PMN AND MONONUCLEAR FEW SQUAMOUS EPITHELIAL CELLS PRESENT FEW GRAM POSITIVE COCCI FEW GRAM POSITIVE RODS RARE GRAM NEGATIVE RODS    Culture PENDING  Incomplete   Report Status PENDING  Incomplete         Radiology Studies: Dg Chest 2 View  Result Date: 01/26/2017 CLINICAL DATA:  Shortness of breath for 2 weeks EXAM: CHEST  2 VIEW COMPARISON:  01/03/2017 FINDINGS: Cardiac shadow remains enlarged. Aortic calcifications are seen. The lungs are well aerated bilaterally. Some very minimal atelectatic changes are noted on the left. No sizable effusion is seen.  Degenerative changes of the thoracic spine are noted. IMPRESSION: Minimal persistent changes in the left base. No other focal abnormality is noted. Electronically Signed   By: Inez Catalina M.D.   On: 01/26/2017 22:25        Scheduled Meds: . aspirin EC  81 mg Oral Daily  . dextromethorphan-guaiFENesin  1 tablet Oral BID  . furosemide  60 mg Intravenous Q12H  . isosorbide mononitrate  10 mg Oral BID  . levalbuterol  1.25 mg Nebulization TID  . metoprolol tartrate  25 mg  Oral BID  . [COMPLETED] nicotine  21 mg Transdermal Once  . potassium chloride  10 mEq Oral Daily   Continuous Infusions: . heparin 1,150 Units/hr (01/27/17 2039)  . nitroGLYCERIN 30 mcg/min (01/28/17 0133)     LOS: 2 days    Time spent: 40 minutes    Alphonsus Doyel, Geraldo Docker, MD [facility] Pager [phone]   If 7PM-7AM, please contact night-coverage [email or web address] Password [email or web address] 01/28/2017, 7:25 AM

## 2017-01-28 NOTE — Progress Notes (Signed)
Progress Note  Patient Name: Gerald Nelson Date of Encounter: 01/28/2017  Primary Cardiologist:  Harrington Challenger    Subjective   NO CP  Breathing is good    Inpatient Medications    Scheduled Meds: . aspirin EC  81 mg Oral Daily  . dextromethorphan-guaiFENesin  1 tablet Oral BID  . furosemide  60 mg Intravenous Q12H  . isosorbide mononitrate  10 mg Oral BID  . levalbuterol  1.25 mg Nebulization TID  . metoprolol tartrate  25 mg Oral BID  . [COMPLETED] nicotine  21 mg Transdermal Once  . potassium chloride  10 mEq Oral Daily   Continuous Infusions: . heparin 1,150 Units/hr (01/28/17 0831)  . nitroGLYCERIN 30 mcg/min (01/28/17 0831)   PRN Meds: acetaminophen **OR** acetaminophen, ondansetron **OR** ondansetron (ZOFRAN) IV   Vital Signs    Vitals:   01/28/17 0136 01/28/17 0439 01/28/17 0739 01/28/17 0901  BP:  (!) 145/114  116/85  Pulse: 77   82  Resp:      Temp:  98.3 F (36.8 C) 97.7 F (36.5 C)   TempSrc:  Oral Oral   SpO2: 96%     Weight:  141 lb 8.6 oz (64.2 kg)    Height:        Intake/Output Summary (Last 24 hours) at 01/28/17 0945 Last data filed at 01/28/17 0901  Gross per 24 hour  Intake          2001.89 ml  Output             4890 ml  Net         -2888.11 ml   Net neg 5.4 L   Filed Weights   01/26/17 2318 01/27/17 0200 01/28/17 0439  Weight: 145 lb 15.1 oz (66.2 kg) 151 lb 14.4 oz (68.9 kg) 141 lb 8.6 oz (64.2 kg)    Telemetry     SR - Personally Reviewed  ECG      Physical Exam   GEN: No acute distress.   Neck: JVP is increased   Cardiac: RRR, no murmurs, rubs, or gallops.  Respiratory  Rhonchi bilateral at bases   GI: Soft, nontender, non-distended  MS: No edema; No deformity. Neuro:  Nonfocal  Psych: Normal affect   Labs    Chemistry Recent Labs Lab 01/26/17 2152 01/27/17 0744 01/28/17 0320  NA 139 140 139  K 4.1 4.9 4.1  CL 106 104 101  CO2 26 24 28   GLUCOSE 134* 85 102*  BUN 22* 22* 21*  CREATININE 1.35* 1.40* 1.55*    CALCIUM 8.9 8.7* 8.9  PROT  --  6.0*  --   ALBUMIN  --  3.0*  --   AST  --  43*  --   ALT  --  27  --   ALKPHOS  --  68  --   BILITOT  --  1.8*  --   GFRNONAA 54* 52* 46*  GFRAA >60 60* 53*  ANIONGAP 7 12 10      Hematology Recent Labs Lab 01/26/17 2152 01/27/17 0744 01/28/17 0320  WBC 7.5 7.5 7.3  RBC 4.41 4.54 4.40  HGB 13.8 14.2 13.7  HCT 39.8 41.2 39.7  MCV 90.2 90.7 90.2  MCH 31.3 31.3 31.1  MCHC 34.7 34.5 34.5  RDW 13.9 14.0 13.8  PLT 224 195 214    Cardiac EnzymesNo results for input(s): TROPONINI in the last 168 hours.  Recent Labs Lab 01/26/17 2206  TROPIPOC 0.10*     BNP Recent Labs  Lab 01/26/17 2152  BNP 3,641.0*     DDimer No results for input(s): DDIMER in the last 168 hours.   Radiology    Dg Chest 2 View  Result Date: 01/26/2017 CLINICAL DATA:  Shortness of breath for 2 weeks EXAM: CHEST  2 VIEW COMPARISON:  01/03/2017 FINDINGS: Cardiac shadow remains enlarged. Aortic calcifications are seen. The lungs are well aerated bilaterally. Some very minimal atelectatic changes are noted on the left. No sizable effusion is seen. Degenerative changes of the thoracic spine are noted. IMPRESSION: Minimal persistent changes in the left base. No other focal abnormality is noted. Electronically Signed   By: Inez Catalina M.D.   On: 01/26/2017 22:25    Cardiac Studies    Patient Profile    Gerald Nelson is a 66 y.o. male with a hx of systolic CHF, severe MR/TR, pulmonary HTN, CVA, CKD, schizophrenia, HTN, and tobacco abuse who is being seen today for the evaluation of acute CHF at the request of Dr. Sherral Hammers.   Assessment & Plan    1  Acute on chronic systolic CHF  Volume status is much improved  Signif diuresis  Still prob with some mild volume increase LVEF is15%  Poss apical thrombus     Should have R and L heart catheterization  Can arrange for Monday  I think if he goes home he may not come back until presents to ED   Switch to lasix daily    2   Valvular dz  Severe MR, sever TR    Again assess at cath    3  Apical "smoke"  NO LV gram at cath  Not a good candidate for anticoag with erratic f/u    4  HTN  BP is improved  FOllow    5  Elev trop  Peak 0.4    6  CKD  Cr 1.55    7  Tob  COunselled on cessaton    Signed, Dorris Carnes, MD  01/28/2017, 9:45 AM

## 2017-01-28 NOTE — Progress Notes (Signed)
Patient has been up, did not get much sleep. Periods of confusion, very anxious, keep demanding coffee and water. Nurse keep reminding patient why he is on fluid restriction.

## 2017-01-28 NOTE — Progress Notes (Signed)
[place] for Heparin Indication: chest pain/ACS  Allergies  Allergen Reactions  . Sulfa Antibiotics Nausea Only    Patient Measurements: Height: 5\' 10"  (177.8 cm) Weight: 151 lb 14.4 oz (68.9 kg) IBW/kg (Calculated) : 73 Heparin Dosing Weight:  68.9 kg  Vital Signs: Temp: 97.5 F (36.4 C) (07/26 2321) Temp Source: Oral (07/26 2321) BP: 136/97 (07/27 0100) Pulse Rate: 77 (07/27 0136)  Labs:  Recent Labs  01/26/17 2152 01/27/17 0744 01/27/17 1811 01/28/17 0320  HGB 13.8 14.2  --  13.7  HCT 39.8 41.2  --  39.7  PLT 224 195  --  214  HEPARINUNFRC  --  0.12* <0.10* 0.34  CREATININE 1.35* 1.40*  --  1.55*    Estimated Creatinine Clearance: 46.9 mL/min (A) (by C-G formula based on SCr of 1.55 mg/dL (H)).  Assessment:  CC/HPI:  67yo male with acute CHF.  Heparin level within goal range on 1150 units/hr.  Hg and pltc wnl.  No bleeding noted per d/w RN.   Goal of Therapy:  Heparin level 0.3-0.7 units/ml Monitor platelets by anticoagulation protocol: Yes   Plan:  Continue heparin 1150 units per hour Recheck heparin level in 6 hours Daily HL and CBC Watch for s/s of bleeding   Lewie Chamber., PharmD [facility]

## 2017-01-28 NOTE — Progress Notes (Addendum)
[place] for Heparin Indication: chest pain/ACS  Allergies  Allergen Reactions  . Sulfa Antibiotics Nausea Only    Patient Measurements: Height: 5\' 10"  (177.8 cm) Weight: 141 lb 8.6 oz (64.2 kg) IBW/kg (Calculated) : 73 Heparin Dosing Weight:  68.9 kg  Vital Signs: Temp: 97.7 F (36.5 C) (07/27 0739) Temp Source: Oral (07/27 0739) BP: 116/85 (07/27 0901) Pulse Rate: 82 (07/27 0901)  Labs:  Recent Labs  01/26/17 2152  01/27/17 0744 01/27/17 1811 01/28/17 0320 01/28/17 0824  HGB 13.8  --  14.2  --  13.7  --   HCT 39.8  --  41.2  --  39.7  --   PLT 224  --  195  --  214  --   HEPARINUNFRC  --   < > 0.12* <0.10* 0.34 0.40  CREATININE 1.35*  --  1.40*  --  1.55*  --   < > = values in this interval not displayed.  Estimated Creatinine Clearance: 43.7 mL/min (A) (by C-G formula based on SCr of 1.55 mg/dL (H)).  Assessment: 66yo male with acute CHF continuing on heparin for ACS. Heparin level remains within goal range on 1150 units/hr.  Hg and pltc wnl.  No bleeding documented. Possible cath at some point per Cardiology.   Goal of Therapy:  Heparin level 0.3-0.7 units/ml Monitor platelets by anticoagulation protocol: Yes   Plan:  Continue heparin 1150 units/hr Daily heparin level and CBC Watch for s/s of bleeding F/u Cardiology plans   Elicia Lamp, PharmD, BCPS Clinical Pharmacist Rx Phone # for today: [phone] After 3:30PM, please call Main Rx: #[id number] 01/28/2017 10:03 AM

## 2017-01-29 DIAGNOSIS — R0602 Shortness of breath: Secondary | ICD-10-CM

## 2017-01-29 DIAGNOSIS — I361 Nonrheumatic tricuspid (valve) insufficiency: Secondary | ICD-10-CM

## 2017-01-29 DIAGNOSIS — I509 Heart failure, unspecified: Secondary | ICD-10-CM

## 2017-01-29 DIAGNOSIS — I10 Essential (primary) hypertension: Secondary | ICD-10-CM

## 2017-01-29 DIAGNOSIS — F17210 Nicotine dependence, cigarettes, uncomplicated: Secondary | ICD-10-CM

## 2017-01-29 DIAGNOSIS — I340 Nonrheumatic mitral (valve) insufficiency: Secondary | ICD-10-CM

## 2017-01-29 DIAGNOSIS — I2729 Other secondary pulmonary hypertension: Secondary | ICD-10-CM

## 2017-01-29 LAB — CBC
HEMATOCRIT: 40 % (ref 39.0–52.0)
Hemoglobin: 13.7 g/dL (ref 13.0–17.0)
MCH: 31.4 pg (ref 26.0–34.0)
MCHC: 34.3 g/dL (ref 30.0–36.0)
MCV: 91.5 fL (ref 78.0–100.0)
Platelets: 226 10*3/uL (ref 150–400)
RBC: 4.37 MIL/uL (ref 4.22–5.81)
RDW: 13.9 % (ref 11.5–15.5)
WBC: 6.8 10*3/uL (ref 4.0–10.5)

## 2017-01-29 LAB — HEPARIN LEVEL (UNFRACTIONATED)
HEPARIN UNFRACTIONATED: 0.25 [IU]/mL — AB (ref 0.30–0.70)
HEPARIN UNFRACTIONATED: 0.42 [IU]/mL (ref 0.30–0.70)

## 2017-01-29 LAB — BASIC METABOLIC PANEL
ANION GAP: 9 (ref 5–15)
BUN: 20 mg/dL (ref 6–20)
CALCIUM: 8.8 mg/dL — AB (ref 8.9–10.3)
CO2: 30 mmol/L (ref 22–32)
Chloride: 98 mmol/L — ABNORMAL LOW (ref 101–111)
Creatinine, Ser: 1.63 mg/dL — ABNORMAL HIGH (ref 0.61–1.24)
GFR, EST AFRICAN AMERICAN: 50 mL/min — AB (ref >60)
GFR, EST NON AFRICAN AMERICAN: 43 mL/min — AB (ref >60)
Glucose, Bld: 106 mg/dL — ABNORMAL HIGH (ref 65–99)
Potassium: 3.9 mmol/L (ref 3.5–5.1)
SODIUM: 137 mmol/L (ref 135–145)

## 2017-01-29 LAB — MAGNESIUM: MAGNESIUM: 1.8 mg/dL (ref 1.7–2.4)

## 2017-01-29 MED ORDER — HALOPERIDOL LACTATE 5 MG/ML IJ SOLN
1.0000 mg | Freq: Four times a day (QID) | INTRAMUSCULAR | Status: DC | PRN
  Administered 2017-01-30: 1 mg via INTRAVENOUS
  Filled 2017-01-29 (×2): qty 1

## 2017-01-29 MED ORDER — ISOSORBIDE MONONITRATE ER 30 MG PO TB24
30.0000 mg | ORAL_TABLET | Freq: Every day | ORAL | Status: DC
  Administered 2017-01-30 – 2017-01-31 (×2): 30 mg via ORAL
  Filled 2017-01-29 (×2): qty 1

## 2017-01-29 MED ORDER — HYDRALAZINE HCL 10 MG PO TABS
20.0000 mg | ORAL_TABLET | Freq: Two times a day (BID) | ORAL | Status: DC
  Administered 2017-01-29 (×2): 20 mg via ORAL
  Filled 2017-01-29 (×2): qty 2

## 2017-01-29 MED ORDER — CARVEDILOL 3.125 MG PO TABS
3.1250 mg | ORAL_TABLET | Freq: Two times a day (BID) | ORAL | Status: DC
  Administered 2017-01-29 – 2017-01-31 (×4): 3.125 mg via ORAL
  Filled 2017-01-29 (×4): qty 1

## 2017-01-29 MED ORDER — NICOTINE 21 MG/24HR TD PT24
21.0000 mg | MEDICATED_PATCH | Freq: Every day | TRANSDERMAL | Status: DC
  Administered 2017-01-29 – 2017-01-31 (×3): 21 mg via TRANSDERMAL
  Filled 2017-01-29 (×3): qty 1

## 2017-01-29 MED ORDER — ISOSORBIDE MONONITRATE 10 MG PO TABS
15.0000 mg | ORAL_TABLET | Freq: Two times a day (BID) | ORAL | Status: DC
  Filled 2017-01-29: qty 1.5

## 2017-01-29 NOTE — Progress Notes (Signed)
PROGRESS NOTE    ADRIN Gerald Nelson  Gerald Nelson:[id number] DOB: 21-Sep-1950 DOA: 01/26/2017 PCP: [name], MD   Brief Narrative:  66 y.o.BM PMHx TIA, Schizophrenia, Tobacco Abuse COPD, HTN, Chronic Systolic CHF, Medical Noncompliance.Recently admitted 3 weeks ago for shortness of breath and was found to have CHF with 2-D echo showing EF of 15%.  Presents to the ER with shortness of breath. During last day patient left AGAINST MEDICAL ADVICE. Patient states he has not been taking any medications except for aspirin. Patient's shortness of breath has progressively worsened since discharge increasing on minimal exertion. Denies any chest pain productive cough fever or chills.   Subjective: 7/28 A/O 4, negative CP negative SOB, negative N/V negative abdominal pain. Patient would like to leave and smoke a cigarette.     Assessment & Plan:   Principal Problem:   Acute respiratory failure with hypoxia (HCC) Active Problems:   Schizophrenia (HCC)   CKD (chronic kidney disease), stage III   Acute systolic CHF (congestive heart failure) (HCC)   CHF (congestive heart failure) (HCC)   Hypertensive urgency   Acute respiratory failure with hypoxia -Multifactorial to include acute on chronic systolic CHF, hypertensive urgency, COPD exacerbation?. -Titrate O2 to maintain SPO2 89-93% -Xopenex QID - Mucinex DM BID -We will hold on starting antibiotics at this time. Negative fever, negative leukocytosis. However monitor closely  Acute on Chronic systolic CHF -Patient does not know his base weight nor does he weigh himself daily. -DC'd Lasix secondary to rising creatinine -Metoprolol --> Coreg 3.125 mg BID per cardiology -Patient with acute on CKD stage III hold on starting ACEI/ARB -7/28 start Hydralazine 20 mg BID per cardiology -7/28 increased Isosorbide mononitrate 15 mg BID -Wean NTG drip to off as tolerated -Strict Gerald Gerald Nelson - 5.7  L -Daily weight Filed Weights   01/27/17  0200 01/28/17 0439 01/29/17 0554  Weight: 151 lb 14.4 oz (68.9 kg) 141 lb 8.6 oz (64.2 kg) 133 lb 13.1 oz (60.7 kg)  -Per cardiology patient scheduled for Right/Left heart cardiac catheterization on Monday  Pulmonary hypertension -Severe pulmonary hypertension by previous echocardiogram see results below. -See CHF  Elevated troponin -Most likely secondary to CHF decompensation  Acute on CKD stage III(baseline Cr 1.3) -Hold ACEI/ARB -Hold nephrotoxic medication -Monitor creatinine closely  Hx TIA -Aspirin  Schizophrenia undifferentiated -7/27 psychiatry consulted for medication -Until psychiatry sees patient will start on Haldol 1 mg QID   Tobacco abuse -Counseled all sequelae of continuing to smoke to include MI, stroke, death. -Patient agreed not to leave and smoke a cigarette if we provided Nicotine patch     DVT prophylaxis: Heparin drip Code Status: Full Family Communication: None Disposition Plan: TBD      Consultants:  Cardiology Dr.Paula Ross    Procedures/Significant Events:  7/3 Echocardiogram:- Left ventricle: moderate LVH. There was severe focal basal hypertrophy of the septum. Coarse trabeculation at the LV apex with multiple false tendinae. -LVEF=15%. Diffuse hypokinesis.  - Mitral valve: moderate to severe regurgitation. - Left atrium: Moderately dilated. - Tricuspid valve: moderate to severe regurgitation. - Pulmonary arteries: Dilated. PA peak pressure: 73 mm Hg (S).   VENTILATOR SETTINGS:    Cultures 7/26 MRSA by PCR negative 7/26 Sputum pending    Antimicrobials: Anti-infectives    None       Devices   LINES / TUBES:      Continuous Infusions: . heparin 1,250 Units/hr (01/29/17 0533)  . nitroGLYCERIN 10 mcg/min (01/29/17 0300)     Objective: Vitals:  01/29/17 0253 01/29/17 0300 01/29/17 0332 01/29/17 0554  BP: (!) 142/104 (!) 130/101 (!) 123/97   Pulse: 87 84 71   Resp: (!) 25     Temp: 97.8 F (36.6 C)      TempSrc: Oral     SpO2: 100% 100% 100%   Weight:    133 lb 13.1 oz (60.7 kg)  Height:        Intake/Output Summary (Last 24 hours) at 01/29/17 0706 Last data filed at 01/29/17 0439  Gross per 24 hour  Intake          1958.18 ml  Output             3900 ml  Net         -1941.82 ml   Filed Weights   01/27/17 0200 01/28/17 0439 01/29/17 0554  Weight: 151 lb 14.4 oz (68.9 kg) 141 lb 8.6 oz (64.2 kg) 133 lb 13.1 oz (60.7 kg)    Examination:  General: A/O 4, No acute respiratory distress, cachectic, poorly kept Eyes: negative scleral hemorrhage, negative anisocoria, negative icterus ENT: Negative Runny nose, negative gingival bleeding, Neck:  Negative scars, masses, torticollis, lymphadenopathy, JVD Lungs:  diffuse poor air movement, Positive diffuse expiratory wheezing, negative crackles  Cardiovascular: Regular rhythm and rate without murmur gallop or rub normal S1 and S2 Abdomen: negative abdominal pain, nondistended, positive soft, bowel sounds, no rebound, no ascites, no appreciable mass Extremities: No significant cyanosis, clubbing, or edema bilateral lower extremities Skin: Negative rashes, lesions, ulcers Psychiatric:  Negative depression, negative anxiety, negative fatigue, negative mania  Central nervous system:  Cranial nerves II through XII intact, tongue/uvula midline, all extremities muscle strength 5/5, sensation intact throughout, negative dysarthria, negative expressive aphasia, negative receptive aphasia. .     Data Reviewed: Care during the described time interval was provided by me .  I have reviewed this patient's available data, including medical history, events of note, physical examination, and all test results as part of my evaluation. I have personally reviewed and interpreted all radiology studies.  CBC:  Recent Labs Lab 01/26/17 2152 01/27/17 0744 01/28/17 0320 01/29/17 0304  WBC 7.5 7.5 7.3 6.8  NEUTROABS  --  5.2  --   --   HGB 13.8 14.2  13.7 13.7  HCT 39.8 41.2 39.7 40.0  MCV 90.2 90.7 90.2 91.5  PLT 224 195 214 790   Basic Metabolic Panel:  Recent Labs Lab 01/26/17 2152 01/27/17 0243 01/27/17 0744 01/28/17 0320 01/29/17 0304  NA 139  --  140 139 137  K 4.1  --  4.9 4.1 3.9  CL 106  --  104 101 98*  CO2 26  --  24 28 30   GLUCOSE 134*  --  85 102* 106*  BUN 22*  --  22* 21* 20  CREATININE 1.35*  --  1.40* 1.55* 1.63*  CALCIUM 8.9  --  8.7* 8.9 8.8*  MG  --  1.7  --   --  1.8   GFR: Estimated Creatinine Clearance: 39.3 mL/min (A) (by C-G formula based on SCr of 1.63 mg/dL (H)). Liver Function Tests:  Recent Labs Lab 01/27/17 0744  AST 43*  ALT 27  ALKPHOS 68  BILITOT 1.8*  PROT 6.0*  ALBUMIN 3.0*   No results for input(s): LIPASE, AMYLASE in the last 168 hours. No results for input(s): AMMONIA in the last 168 hours. Coagulation Profile: No results for input(s): INR, PROTIME in the last 168 hours. Cardiac Enzymes: No results for input(s):  CKTOTAL, CKMB, CKMBINDEX, TROPONINI in the last 168 hours. BNP (last 3 results) No results for input(s): PROBNP in the last 8760 hours. HbA1C: No results for input(s): HGBA1C in the last 72 hours. CBG: No results for input(s): GLUCAP in the last 168 hours. Lipid Profile: No results for input(s): CHOL, HDL, LDLCALC, TRIG, CHOLHDL, LDLDIRECT in the last 72 hours. Thyroid Function Tests:  Recent Labs  01/27/17 0243  TSH 3.096   Anemia Panel: No results for input(s): VITAMINB12, FOLATE, FERRITIN, TIBC, IRON, RETICCTPCT in the last 72 hours. Urine analysis:    Component Value Date/Time   COLORURINE YELLOW 12/11/2013 1733   APPEARANCEUR CLEAR 12/11/2013 1733   LABSPEC 1.014 12/11/2013 1733   PHURINE 5.5 12/11/2013 1733   GLUCOSEU NEGATIVE 12/11/2013 1733   HGBUR NEGATIVE 12/11/2013 1733   BILIRUBINUR NEGATIVE 12/11/2013 1733   KETONESUR NEGATIVE 12/11/2013 1733   PROTEINUR NEGATIVE 12/11/2013 1733   UROBILINOGEN 1.0 12/11/2013 1733   NITRITE NEGATIVE  12/11/2013 1733   LEUKOCYTESUR NEGATIVE 12/11/2013 1733   Sepsis Labs: @LABRCNTIP (procalcitonin:4,lacticidven:4)  ) Recent Results (from the past 240 hour(s))  MRSA PCR Screening     Status: None   Collection Time: 01/27/17  3:32 AM  Result Value Ref Range Status   MRSA by PCR NEGATIVE NEGATIVE Final    Comment:        The GeneXpert MRSA Assay (FDA approved for NASAL specimens only), is one component of a comprehensive MRSA colonization surveillance program. It is not intended to diagnose MRSA infection nor to guide or monitor treatment for MRSA infections.   Culture, expectorated sputum-assessment     Status: None   Collection Time: 01/27/17 11:25 AM  Result Value Ref Range Status   Specimen Description EXPECTORATED SPUTUM  Final   Special Requests NONE  Final   Sputum evaluation THIS SPECIMEN IS ACCEPTABLE FOR SPUTUM CULTURE  Final   Report Status 01/27/2017 FINAL  Final  Culture, respiratory (NON-Expectorated)     Status: None (Preliminary result)   Collection Time: 01/27/17 11:25 AM  Result Value Ref Range Status   Specimen Description EXPECTORATED SPUTUM  Final   Special Requests NONE Reflexed from [id number]  Final   Gram Stain   Final    MODERATE WBC PRESENT,BOTH PMN AND MONONUCLEAR FEW SQUAMOUS EPITHELIAL CELLS PRESENT FEW GRAM POSITIVE COCCI FEW GRAM POSITIVE RODS RARE GRAM NEGATIVE RODS    Culture PENDING  Incomplete   Report Status PENDING  Incomplete         Radiology Studies: No results found.      Scheduled Meds: . aspirin EC  81 mg Oral Daily  . dextromethorphan-guaiFENesin  1 tablet Oral BID  . isosorbide mononitrate  10 mg Oral BID  . levalbuterol  1.25 mg Nebulization BID  . metoprolol tartrate  25 mg Oral BID   Continuous Infusions: . heparin 1,250 Units/hr (01/29/17 0533)  . nitroGLYCERIN 10 mcg/min (01/29/17 0300)     LOS: 3 days    Time spent: 40 minutes    Adyline Huberty, Geraldo Docker, MD [facility] Pager [phone]   If  7PM-7AM, please contact night-coverage [email or web address] Password [email or web address] 01/29/2017, 7:06 AM

## 2017-01-29 NOTE — Progress Notes (Signed)
ANTICOAGULATION CONSULT NOTE - Follow Up Consult  Pharmacy Consult for Heparin Indication: chest pain/ACS  Allergies  Allergen Reactions  . Sulfa Antibiotics Nausea Only    Patient Measurements: Height: 5\' 10"  (177.8 cm) Weight: 141 lb 8.6 oz (64.2 kg) IBW/kg (Calculated) : 73 Heparin Dosing Weight: 64  Vital Signs: Temp: 97.8 F (36.6 C) (07/28 0253) Temp Source: Oral (07/28 0253) BP: 123/97 (07/28 0332) Pulse Rate: 71 (07/28 0332)  Labs:  Recent Labs  01/27/17 0744  01/28/17 0320 01/28/17 0824 01/29/17 0304  HGB 14.2  --  13.7  --  13.7  HCT 41.2  --  39.7  --  40.0  PLT 195  --  214  --  226  HEPARINUNFRC 0.12*  < > 0.34 0.40 0.25*  CREATININE 1.40*  --  1.55*  --  1.63*  < > = values in this interval not displayed.  Estimated Creatinine Clearance: 41.6 mL/min (A) (by C-G formula based on SCr of 1.63 mg/dL (H)).   Medications:  Scheduled:  . aspirin EC  81 mg Oral Daily  . dextromethorphan-guaiFENesin  1 tablet Oral BID  . furosemide  60 mg Intravenous Daily  . isosorbide mononitrate  10 mg Oral BID  . levalbuterol  1.25 mg Nebulization BID  . metoprolol tartrate  25 mg Oral BID  . potassium chloride  10 mEq Oral Daily    Assessment: 66yo male on heparin for ACS.  Heparin level fell to subtherapeutic this AM, on 1150 units/hr.  Per d/w RN, no problems with the IV nor s/s of bleeding.  Hg is stable and pltc wnl.    Goal of Therapy:  Heparin level 0.3-0.7 units/ml Monitor platelets by anticoagulation protocol: Yes   Plan:  Increase heparin to 1250 units/hr Repeat heparin level 6hr Watch for s/s of bleeding   Gracy Bruins, B.S., PharmD [facility]

## 2017-01-29 NOTE — Consult Note (Signed)
[place] Psychiatry Consult   Reason for Consult: Schizophrenia need medication recommendation Referring Physician:  Dr. Clydene Laming Patient Identification: Gerald Nelson MRN:  [id number] Principal Diagnosis: Schizophrenia [facility]) Diagnosis:   Patient Active Problem List   Diagnosis Date Noted  . Acute respiratory failure with hypoxia ([place]) [J96.01] 01/27/2017  . Hypertensive urgency [I16.0] 01/27/2017  . CHF (congestive heart failure) ([place]) [I50.9] 01/26/2017  . Acute systolic CHF (congestive heart failure) ([place]) [I50.21]   . CKD (chronic kidney disease), stage III [N18.3] 01/04/2017  . Essential hypertension [I10] 01/04/2017  . Acute CHF (congestive heart failure) ([place]) [I50.9] 01/03/2017  . CVA (cerebral infarction) [I63.9] 12/12/2013  . Hiatal hernia [K44.9] 12/06/2012  . Syncope [R55] 12/06/2012  . Schizophrenia ([place]) [F20.9]     Total Time spent with patient: 45 minutes  Subjective:   Gerald Nelson is a 66 y.o. male patient admitted with shortness of breath.  HPI: 66 year old man who reports long history of Schizophrenia and Multiple medical problem. Patient is alert and oriented to time, place and person. He states that he was admitted to the hospital due to shortness of breath and not psychiatric issues. She states categorically that he has been placed on Haldol Decanoate 100 mg IM every month by his psychiatrist in [place], last dose was administered on July 10 th, so he is not due for another injection until August 10 th. Patient denies psychosis, delusions, depression, SI and HI and reports that he was combative yesterday due to shortness of breath.  Past Psychiatric History: as above  Risk to Self: Is patient at risk for suicide?: No Risk to Others:   Prior Inpatient Therapy:   Prior Outpatient Therapy:    Past Medical History:  Past Medical History:  Diagnosis Date  . COPD (chronic obstructive pulmonary disease) ([place])   . Hernia, hiatal   . Hypertension   .  Schizophrenia [facility])     Past Surgical History:  Procedure Laterality Date  . PENILE PROSTHESIS IMPLANT     Family History:  Family History  Problem Relation Age of Onset  . Hypertension Mother   . Hypertension Father    Family Psychiatric  History:  Social History:  History  Alcohol Use No     History  Drug Use No    Social History   Social History  . Marital status: Single    Spouse name: N/A  . Number of children: N/A  . Years of education: N/A   Social History Main Topics  . Smoking status: Current Every Day Smoker    Packs/day: 1.50    Types: Cigarettes  . Smokeless tobacco: Never Used  . Alcohol use No  . Drug use: No  . Sexual activity: Not Asked   Other Topics Concern  . None   Social History Narrative  . None   Additional Social History:    Allergies:   Allergies  Allergen Reactions  . Sulfa Antibiotics Nausea Only    Labs:  Results for orders placed or performed during the hospital encounter of 01/26/17 (from the past 48 hour(s))  Heparin level (unfractionated)     Status: Abnormal   Collection Time: 01/27/17  6:11 PM  Result Value Ref Range   Heparin Unfractionated <0.10 (L) 0.30 - 0.70 IU/mL    Comment:        IF HEPARIN RESULTS ARE BELOW EXPECTED VALUES, AND PATIENT DOSAGE HAS BEEN CONFIRMED, SUGGEST FOLLOW UP TESTING OF ANTITHROMBIN III LEVELS.   CBC  Status: None   Collection Time: 01/28/17  3:20 AM  Result Value Ref Range   WBC 7.3 4.0 - 10.5 K/uL   RBC 4.40 4.22 - 5.81 MIL/uL   Hemoglobin 13.7 13.0 - 17.0 g/dL   HCT 39.7 39.0 - 52.0 %   MCV 90.2 78.0 - 100.0 fL   MCH 31.1 26.0 - 34.0 pg   MCHC 34.5 30.0 - 36.0 g/dL   RDW 13.8 11.5 - 15.5 %   Platelets 214 150 - 400 K/uL  Basic metabolic panel     Status: Abnormal   Collection Time: 01/28/17  3:20 AM  Result Value Ref Range   Sodium 139 135 - 145 mmol/L   Potassium 4.1 3.5 - 5.1 mmol/L   Chloride 101 101 - 111 mmol/L   CO2 28 22 - 32 mmol/L   Glucose, Bld 102 (H) 65  - 99 mg/dL   BUN 21 (H) 6 - 20 mg/dL   Creatinine, Ser 1.55 (H) 0.61 - 1.24 mg/dL   Calcium 8.9 8.9 - 10.3 mg/dL   GFR calc non Af Amer 46 (L) >60 mL/min   GFR calc Af Amer 53 (L) >60 mL/min    Comment: (NOTE) The eGFR has been calculated using the CKD EPI equation. This calculation has not been validated in all clinical situations. eGFR's persistently <60 mL/min signify possible Chronic Kidney Disease.    Anion gap 10 5 - 15  Heparin level (unfractionated)     Status: None   Collection Time: 01/28/17  3:20 AM  Result Value Ref Range   Heparin Unfractionated 0.34 0.30 - 0.70 IU/mL    Comment:        IF HEPARIN RESULTS ARE BELOW EXPECTED VALUES, AND PATIENT DOSAGE HAS BEEN CONFIRMED, SUGGEST FOLLOW UP TESTING OF ANTITHROMBIN III LEVELS.   Heparin level (unfractionated)     Status: None   Collection Time: 01/28/17  8:24 AM  Result Value Ref Range   Heparin Unfractionated 0.40 0.30 - 0.70 IU/mL    Comment:        IF HEPARIN RESULTS ARE BELOW EXPECTED VALUES, AND PATIENT DOSAGE HAS BEEN CONFIRMED, SUGGEST FOLLOW UP TESTING OF ANTITHROMBIN III LEVELS.   CBC     Status: None   Collection Time: 01/29/17  3:04 AM  Result Value Ref Range   WBC 6.8 4.0 - 10.5 K/uL   RBC 4.37 4.22 - 5.81 MIL/uL   Hemoglobin 13.7 13.0 - 17.0 g/dL   HCT 40.0 39.0 - 52.0 %   MCV 91.5 78.0 - 100.0 fL   MCH 31.4 26.0 - 34.0 pg   MCHC 34.3 30.0 - 36.0 g/dL   RDW 13.9 11.5 - 15.5 %   Platelets 226 150 - 400 K/uL  Basic metabolic panel     Status: Abnormal   Collection Time: 01/29/17  3:04 AM  Result Value Ref Range   Sodium 137 135 - 145 mmol/L   Potassium 3.9 3.5 - 5.1 mmol/L   Chloride 98 (L) 101 - 111 mmol/L   CO2 30 22 - 32 mmol/L   Glucose, Bld 106 (H) 65 - 99 mg/dL   BUN 20 6 - 20 mg/dL   Creatinine, Ser 1.63 (H) 0.61 - 1.24 mg/dL   Calcium 8.8 (L) 8.9 - 10.3 mg/dL   GFR calc non Af Amer 43 (L) >60 mL/min   GFR calc Af Amer 50 (L) >60 mL/min    Comment: (NOTE) The eGFR has been  calculated using the CKD EPI equation. This calculation has  not been validated in all clinical situations. eGFR's persistently <60 mL/min signify possible Chronic Kidney Disease.    Anion gap 9 5 - 15  Heparin level (unfractionated)     Status: Abnormal   Collection Time: 01/29/17  3:04 AM  Result Value Ref Range   Heparin Unfractionated 0.25 (L) 0.30 - 0.70 IU/mL    Comment:        IF HEPARIN RESULTS ARE BELOW EXPECTED VALUES, AND PATIENT DOSAGE HAS BEEN CONFIRMED, SUGGEST FOLLOW UP TESTING OF ANTITHROMBIN III LEVELS.   Magnesium     Status: None   Collection Time: 01/29/17  3:04 AM  Result Value Ref Range   Magnesium 1.8 1.7 - 2.4 mg/dL    Current Facility-Administered Medications  Medication Dose Route Frequency Provider Last Rate Last Dose  . acetaminophen (TYLENOL) tablet 650 mg  650 mg Oral Q6H PRN Rise Patience, MD       Or  . acetaminophen (TYLENOL) suppository 650 mg  650 mg Rectal Q6H PRN Rise Patience, MD      . aspirin EC tablet 81 mg  81 mg Oral Daily Rise Patience, MD   81 mg at 01/29/17 7341  . carvedilol (COREG) tablet 3.125 mg  3.125 mg Oral BID WC Satira Sark, MD      . dextromethorphan-guaiFENesin [facility] DM) 30-600 MG per 12 hr tablet 1 tablet  1 tablet Oral BID Allie Bossier, MD   1 tablet at 01/29/17 9379  . haloperidol lactate (HALDOL) injection 1 mg  1 mg Intravenous Q6H PRN Allie Bossier, MD      . heparin ADULT infusion 100 units/mL (25000 units/2100m sodium chloride 0.45%)  1,250 Units/hr Intravenous Continuous HJaquita Folds RPH 12.5 mL/hr at 01/29/17 0533 1,250 Units/hr at 01/29/17 0533  . hydrALAZINE (APRESOLINE) tablet 20 mg  20 mg Oral BID MSatira Sark MD   20 mg at 01/29/17 1006  . isosorbide mononitrate (ISMO,MONOKET) tablet 15 mg  15 mg Oral BID WAllie Bossier MD      . levalbuterol ([facility] nebulizer solution 1.25 mg  1.25 mg Nebulization BID WAllie Bossier MD   1.25 mg at 01/29/17 0809  . nicotine  (NICODERM CQ - dosed in mg/24 hours) patch 21 mg  21 mg Transdermal Daily WAllie Bossier MD   21 mg at 01/29/17 1251  . nitroGLYCERIN 50 mg in dextrose 5 % 250 mL (0.2 mg/mL) infusion  10-200 mcg/min Intravenous Titrated GDelora Fuel MD 3 mL/hr at 01/29/17 1128 10 mcg/min at 01/29/17 1128  . ondansetron (ZOFRAN) tablet 4 mg  4 mg Oral Q6H PRN KRise Patience MD       Or  . ondansetron ([facility] injection 4 mg  4 mg Intravenous Q6H PRN KRise Patience MD        Musculoskeletal: Strength & Muscle Tone: within normal limits Gait & Station: normal Patient leans: N/A  Psychiatric Specialty Exam: Physical Exam  Psychiatric: He has a normal mood and affect. His speech is normal and behavior is normal. Judgment and thought content normal. Cognition and memory are normal.    Review of Systems  Constitutional: Negative.   HENT: Negative.   Eyes: Negative.   Respiratory: Positive for shortness of breath.   Genitourinary: Negative.   Skin: Negative.   Neurological: Negative.   Psychiatric/Behavioral: Negative.     Blood pressure (!) 148/115, pulse 80, temperature (!) 97.5 F (36.4 C), temperature source Oral, resp. rate 19, height _0  (1.778  m), weight 60.7 kg (133 lb 13.1 oz), SpO2 97 %.Body mass index is 19.2 kg/m.  General Appearance: Casual  Eye Contact:  Good  Speech:  Clear and Coherent  Volume:  Normal  Mood:  Euthymic  Affect:  Appropriate  Thought Process:  Coherent  Orientation:  Full (Time, Place, and Person)  Thought Content:  Logical  Suicidal Thoughts:  No  Homicidal Thoughts:  No  Memory:  Immediate;   Fair Recent;   Fair Remote;   Good  Judgement:  Fair  Insight:  Fair  Psychomotor Activity:  Normal  Concentration:  Concentration: Fair and Attention Span: Fair  Recall:  Good  Fund of Knowledge:  Good  Language:  Good  Akathisia:  No  Handed:  Right  AIMS (if indicated):     Assets:  Communication Skills Desire for Improvement  ADL's:  Intact   Cognition:  WNL  Sleep:   fair     Treatment Plan Summary: Patient with history of Schizophrenia who is up to date on his antipsychotic medication and treatment but wants his treatment focused on his shortness of breath and Heart which he attributes to his occasional agitated behavior. Plan: -Patient may be placed on prn Haldol for agitation but does not need a routine antipsychotic, he has been doing well on Haldol decanoate for years. He is due for Haldol decanoate 100 mg IM on 02/11/17 -Patient is cleared by psychiatric service.  Disposition: No evidence of imminent risk to self or others at present.   Patient does not meet criteria for psychiatric inpatient admission. Supportive therapy provided about ongoing stressors. Social worker to assist with referring patient back to his psychiatrist upon discharge  Corena Pilgrim, MD 01/29/2017 1:33 PM

## 2017-01-29 NOTE — Progress Notes (Signed)
Progress Note  Patient Name: Gerald Nelson Date of Encounter: 01/29/2017  Primary Cardiologist: Dr. Dorris Carnes  Subjective   Short of breath moving around in room. No chest pain.  Inpatient Medications    Scheduled Meds: . aspirin EC  81 mg Oral Daily  . dextromethorphan-guaiFENesin  1 tablet Oral BID  . isosorbide mononitrate  10 mg Oral BID  . levalbuterol  1.25 mg Nebulization BID  . metoprolol tartrate  25 mg Oral BID   Continuous Infusions: . heparin 1,250 Units/hr (01/29/17 0533)  . nitroGLYCERIN 15 mcg/min (01/29/17 0804)   PRN Meds: acetaminophen **OR** acetaminophen, ondansetron **OR** ondansetron (ZOFRAN) IV   Vital Signs    Vitals:   01/29/17 0332 01/29/17 0554 01/29/17 0757 01/29/17 0809  BP: (!) 123/97  (!) 148/115   Pulse: 71  80   Resp:   19   Temp:   98 F (36.7 C)   TempSrc:   Oral   SpO2: 100%  98% 97%  Weight:  133 lb 13.1 oz (60.7 kg)    Height:        Intake/Output Summary (Last 24 hours) at 01/29/17 0920 Last data filed at 01/29/17 0804  Gross per 24 hour  Intake          1489.52 ml  Output             2550 ml  Net         -1060.48 ml   Filed Weights   01/27/17 0200 01/28/17 0439 01/29/17 0554  Weight: 151 lb 14.4 oz (68.9 kg) 141 lb 8.6 oz (64.2 kg) 133 lb 13.1 oz (60.7 kg)    Telemetry    Sinus rhythm and sinus tachycardia. Personally reviewed.  ECG    Tracing from 01/26/2017 showed sinus rhythm with biatrial enlargement, IVCD, and repolarization abnormalities. Personally reviewed.  Physical Exam   GEN: Cachectic appearing elderly male, mildly short of breath moving around in room. Neck: No JVD. Cardiac:  Indistinct PMI, RRR, no gallop.  Respiratory:  Coarse breath sounds, mild expiratory wheeze. GI: Soft, nontender, bowel sounds present. MS: No pitting edema. Neuro:  Nonfocal. Psych: Alert and oriented x 3. Normal affect.  Labs    Chemistry Recent Labs Lab 01/27/17 0744 01/28/17 0320 01/29/17 0304  NA 140 139  137  K 4.9 4.1 3.9  CL 104 101 98*  CO2 24 28 30   GLUCOSE 85 102* 106*  BUN 22* 21* 20  CREATININE 1.40* 1.55* 1.63*  CALCIUM 8.7* 8.9 8.8*  PROT 6.0*  --   --   ALBUMIN 3.0*  --   --   AST 43*  --   --   ALT 27  --   --   ALKPHOS 68  --   --   BILITOT 1.8*  --   --   GFRNONAA 52* 46* 43*  GFRAA 60* 53* 50*  ANIONGAP 12 10 9      Hematology Recent Labs Lab 01/27/17 0744 01/28/17 0320 01/29/17 0304  WBC 7.5 7.3 6.8  RBC 4.54 4.40 4.37  HGB 14.2 13.7 13.7  HCT 41.2 39.7 40.0  MCV 90.7 90.2 91.5  MCH 31.3 31.1 31.4  MCHC 34.5 34.5 34.3  RDW 14.0 13.8 13.9  PLT 195 214 226    Cardiac EnzymesNo results for input(s): TROPONINI in the last 168 hours.  Recent Labs Lab 01/26/17 2206  TROPIPOC 0.10*     BNP Recent Labs Lab 01/26/17 2152  BNP 3,641.0*     Radiology  No results found.  Cardiac Studies   Echocardiogram 01/04/2017: Study Conclusions  - Left ventricle: The cavity size was normal. Wall thickness was   increased in a pattern of moderate LVH. There was severe focal   basal hypertrophy of the septum. Coarse trabeculation at the LV   apex with multiple false tendinae. Does not meet diagnostic   criteria for non-compaction. LV apical smoke noted, but no   thrombus. Systolic function was severely reduced. The estimated   ejection fraction was 15%. Diffuse hypokinesis. The study is not   technically sufficient to allow evaluation of LV diastolic   function. - Aortic valve: Trileaflet. Sclerosis without stenosis. There was   mild regurgitation. - Mitral valve: Mildly thickened leaflets . There was moderate to   severe regurgitation. - Left atrium: Moderately dilated. - Right ventricle: The cavity size was normal. Systolic function   was low normal. - Right atrium: The atrium was mildly dilated. - Atrial septum: No defect or patent foramen ovale was identified. - Tricuspid valve: There was moderate to severe regurgitation. - Pulmonary arteries:  Dilated. PA peak pressure: 73 mm Hg (S). - Inferior vena cava: The vessel was dilated. The respirophasic   diameter changes were blunted (< 50%), consistent with elevated   central venous pressure. - Pericardium, extracardiac: A trivial pericardial effusion was   identified posterior to the heart. Tamponade physiology cannot be   evaluated in the setting of severe pulmonary hypertension.   Clinical correlation is always advised.  Impressions:  - LVEF 15%, severe global hypokinesis, moderate LVH, coarse   trabeculation of the LV apex with numerous false tendinae of the   left ventricle, very stagnant blood flow at the LV apex with   smoke but no obvious LV thrombus - Definity contrast was given,   again, noting stagnant apical blood flow - this could suggest   recent thrombus and certainly high risk for apical thrombus   formation. Other findings include aortic sclerosis with mild AI,   moderate to severe MR, moderate LAE, mild RAE, moderate to severe   TR, moderate to severe pulmonary hypertension (RVSP 73 mmHg),   dilated IVC, trivial posterior pericardial effusion - tamponade   physiology cannot be excluded given the degree of pulmonary   hypertension.  Patient Profile     66 y.o. male with a history of chronic systolic heart failure with severe cardiomyopathy and LVEF currently 15%, possible loosely organized LV apical thrombus, severe pulmonary hypertension in association with severe mitral and tricuspid regurgitation, CKD stage III, hypertension, tobacco abuse, and schizophrenia. He is admitted with acute on chronic systolic heart failure.  Assessment & Plan    1. Acute on chronic systolic heart failure, symptoms somewhat better with diuresis of approximately 6 L overall. Diuretics now held with creatinine up to 1.6.  2. Severe cardiomyopathy with LVEF approximately 15%, global hypokinesis suggestive of nonischemic etiology.  3. Severe pulmonary hypertension, RVSP 73 mmHg by  echocardiogram.  4. Valvular heart disease including severe mitral and tricuspid regurgitation.  5. Possible loosely organized LV apical thrombus/smoke.  6. CKD, stage 3. Current creatinine up to 1.6.  7. Tobacco abuse.  Would continue aspirin and low-dose Imdur. Switch from Lopressor to Coreg and try to add low-dose hydralazine. Would not push forward with ACE inhibitor or ARB at this time in light of fluctuating renal function and plan for cardiac catheterization. Hold diuretics at this time. Continue heparin. I reviewed Dr. Harrington Challenger consultation note and recommendations. Patient tentatively scheduled for left  and right heart catheterization on Monday, no ventriculogram.  Signed, Rozann Lesches, MD  01/29/2017, 9:20 AM

## 2017-01-29 NOTE — Progress Notes (Signed)
Pt ambulated in hall with assistance.  Tolerated well.  Will continue to monitor.  Educated pt on fluid restriction. Saunders Revel T

## 2017-01-29 NOTE — Progress Notes (Signed)
ANTICOAGULATION CONSULT NOTE - Follow Up Consult  Pharmacy Consult for Heparin Indication: chest pain/ACS  Allergies  Allergen Reactions  . Sulfa Antibiotics Nausea Only    Patient Measurements: Height: 5\' 10"  (177.8 cm) Weight: 133 lb 13.1 oz (60.7 kg) IBW/kg (Calculated) : 73 Heparin Dosing Weight: 64  Vital Signs: Temp: 97.5 F (36.4 C) (07/28 1127) Temp Source: Oral (07/28 1127) BP: 148/115 (07/28 0757) Pulse Rate: 80 (07/28 0757)  Labs:  Recent Labs  01/27/17 0744  01/28/17 0320 01/28/17 0824 01/29/17 0304 01/29/17 1237  HGB 14.2  --  13.7  --  13.7  --   HCT 41.2  --  39.7  --  40.0  --   PLT 195  --  214  --  226  --   HEPARINUNFRC 0.12*  < > 0.34 0.40 0.25* 0.42  CREATININE 1.40*  --  1.55*  --  1.63*  --   < > = values in this interval not displayed.  Estimated Creatinine Clearance: 39.3 mL/min (A) (by C-G formula based on SCr of 1.63 mg/dL (H)).   Medications:  Scheduled:  . aspirin EC  81 mg Oral Daily  . carvedilol  3.125 mg Oral BID WC  . dextromethorphan-guaiFENesin  1 tablet Oral BID  . hydrALAZINE  20 mg Oral BID  . isosorbide mononitrate  15 mg Oral BID  . levalbuterol  1.25 mg Nebulization BID  . nicotine  21 mg Transdermal Daily    Assessment: 66yo male on heparin for ACS.  Heparin level fell to subtherapeutic this AM, on 1150 units/hr.  Per d/w RN, no problems with the IV nor s/s of bleeding.  Hg is stable and pltc wnl.  Heparin drip rate increased 1250 uts/hr HL 042 at goal   Goal of Therapy:  Heparin level 0.3-0.7 units/ml Monitor platelets by anticoagulation protocol: Yes   Plan:  Continue  heparin to 1250 units/hr Watch for s/s of bleeding Daily HL, CBC   Bonnita Nasuti Pharm.D. CPP, BCPS Clinical Pharmacist [phone] 01/29/2017 2:41 PM

## 2017-01-30 LAB — CULTURE, RESPIRATORY W GRAM STAIN: Culture: NORMAL

## 2017-01-30 LAB — BASIC METABOLIC PANEL
ANION GAP: 8 (ref 5–15)
BUN: 23 mg/dL — ABNORMAL HIGH (ref 6–20)
CO2: 29 mmol/L (ref 22–32)
Calcium: 8.9 mg/dL (ref 8.9–10.3)
Chloride: 98 mmol/L — ABNORMAL LOW (ref 101–111)
Creatinine, Ser: 1.73 mg/dL — ABNORMAL HIGH (ref 0.61–1.24)
GFR, EST AFRICAN AMERICAN: 46 mL/min — AB (ref >60)
GFR, EST NON AFRICAN AMERICAN: 40 mL/min — AB (ref >60)
Glucose, Bld: 109 mg/dL — ABNORMAL HIGH (ref 65–99)
POTASSIUM: 4.4 mmol/L (ref 3.5–5.1)
SODIUM: 135 mmol/L (ref 135–145)

## 2017-01-30 LAB — CULTURE, RESPIRATORY

## 2017-01-30 LAB — HEPARIN LEVEL (UNFRACTIONATED): HEPARIN UNFRACTIONATED: 0.34 [IU]/mL (ref 0.30–0.70)

## 2017-01-30 LAB — CBC
HCT: 41.6 % (ref 39.0–52.0)
Hemoglobin: 13.9 g/dL (ref 13.0–17.0)
MCH: 30.5 pg (ref 26.0–34.0)
MCHC: 33.4 g/dL (ref 30.0–36.0)
MCV: 91.2 fL (ref 78.0–100.0)
PLATELETS: 218 10*3/uL (ref 150–400)
RBC: 4.56 MIL/uL (ref 4.22–5.81)
RDW: 13.7 % (ref 11.5–15.5)
WBC: 7.3 10*3/uL (ref 4.0–10.5)

## 2017-01-30 LAB — MAGNESIUM: MAGNESIUM: 2 mg/dL (ref 1.7–2.4)

## 2017-01-30 MED ORDER — HALOPERIDOL LACTATE 5 MG/ML IJ SOLN
2.0000 mg | Freq: Four times a day (QID) | INTRAMUSCULAR | Status: DC
  Administered 2017-01-30 – 2017-01-31 (×3): 2 mg via INTRAVENOUS
  Filled 2017-01-30 (×3): qty 1

## 2017-01-30 MED ORDER — LEVALBUTEROL HCL 1.25 MG/0.5ML IN NEBU: 1.2500 mg | INHALATION_SOLUTION | Freq: Two times a day (BID) | RESPIRATORY_TRACT | Status: DC

## 2017-01-30 MED ORDER — HYDRALAZINE HCL 50 MG PO TABS
25.0000 mg | ORAL_TABLET | Freq: Two times a day (BID) | ORAL | Status: DC
  Administered 2017-01-30 – 2017-01-31 (×3): 25 mg via ORAL
  Filled 2017-01-30 (×3): qty 1

## 2017-01-30 MED ORDER — METHYLPREDNISOLONE SODIUM SUCC 125 MG IJ SOLR
60.0000 mg | INTRAMUSCULAR | Status: DC
  Administered 2017-01-30: 60 mg via INTRAVENOUS
  Filled 2017-01-30: qty 2

## 2017-01-30 MED ORDER — LEVALBUTEROL HCL 1.25 MG/0.5ML IN NEBU
1.2500 mg | INHALATION_SOLUTION | Freq: Two times a day (BID) | RESPIRATORY_TRACT | Status: DC
  Administered 2017-01-30 – 2017-01-31 (×2): 1.25 mg via RESPIRATORY_TRACT
  Filled 2017-01-30 (×2): qty 0.5

## 2017-01-30 NOTE — Progress Notes (Signed)
ANTICOAGULATION CONSULT NOTE - Follow Up Consult  Pharmacy Consult for Heparin Indication: chest pain/ACS  Allergies  Allergen Reactions  . Sulfa Antibiotics Nausea Only    Patient Measurements: Height: 5\' 10"  (177.8 cm) Weight: 136 lb 14.5 oz (62.1 kg) IBW/kg (Calculated) : 73 Heparin Dosing Weight: 64  Vital Signs: Temp: 97.5 F (36.4 C) (07/29 0730) Temp Source: Oral (07/29 0730) BP: 152/112 (07/29 0730) Pulse Rate: 88 (07/29 0730)  Labs:  Recent Labs  01/28/17 0320  01/29/17 0304 01/29/17 1237 01/30/17 0352  HGB 13.7  --  13.7  --  13.9  HCT 39.7  --  40.0  --  41.6  PLT 214  --  226  --  218  HEPARINUNFRC 0.34  < > 0.25* 0.42 0.34  CREATININE 1.55*  --  1.63*  --  1.73*  < > = values in this interval not displayed.  Estimated Creatinine Clearance: 37.9 mL/min (A) (by C-G formula based on SCr of 1.73 mg/dL (H)).   Medications:  Scheduled:  . aspirin EC  81 mg Oral Daily  . carvedilol  3.125 mg Oral BID WC  . dextromethorphan-guaiFENesin  1 tablet Oral BID  . hydrALAZINE  25 mg Oral BID  . isosorbide mononitrate  30 mg Oral Daily  . levalbuterol  1.25 mg Nebulization BID  . nicotine  21 mg Transdermal Daily    Assessment: 66yoM on heparin for ACS and questionable LV apical thrombus. Heparin level currently therapeutic at 0.34, CBC stable, no S/Sx bleeding noted. Pt refusing cath for now.  Goal of Therapy:  Heparin level 0.3-0.7 units/ml Monitor platelets by anticoagulation protocol: Yes   Plan:  -Continue heparin at 1250 units/hr -Monitor heparin level, CBC, S/Sx bleeding daily -Follow-up further cardiac plans  Arrie Senate, PharmD PGY-2 Cardiology Pharmacy Resident Pager: [phone] 01/30/2017

## 2017-01-30 NOTE — Progress Notes (Signed)
Progress Note  Patient Name: Gerald Nelson Date of Encounter: 01/30/2017  Primary Cardiologist: Dr. Dorris Carnes  Subjective   Sitting in bed side chair. Walked to limited degree in the hall yesterday. No chest pain.  Inpatient Medications    Scheduled Meds: . aspirin EC  81 mg Oral Daily  . carvedilol  3.125 mg Oral BID WC  . dextromethorphan-guaiFENesin  1 tablet Oral BID  . hydrALAZINE  20 mg Oral BID  . isosorbide mononitrate  30 mg Oral Daily  . levalbuterol  1.25 mg Nebulization BID  . nicotine  21 mg Transdermal Daily   Continuous Infusions: . heparin 1,250 Units/hr (01/29/17 2000)  . nitroGLYCERIN Stopped (01/29/17 1402)   PRN Meds: acetaminophen **OR** acetaminophen, haloperidol lactate, ondansetron **OR** ondansetron (ZOFRAN) IV   Vital Signs    Vitals:   01/30/17 0327 01/30/17 0557 01/30/17 0730 01/30/17 0732  BP: (!) 134/94  (!) 152/112   Pulse: 81  88   Resp: 11  (!) 23   Temp: 98 F (36.7 C)  (!) 97.5 F (36.4 C)   TempSrc: Oral  Oral   SpO2: 99%  100% 100%  Weight:  136 lb 14.5 oz (62.1 kg)    Height:        Intake/Output Summary (Last 24 hours) at 01/30/17 0816 Last data filed at 01/30/17 0600  Gross per 24 hour  Intake          1617.17 ml  Output             2025 ml  Net          -407.83 ml   Filed Weights   01/28/17 0439 01/29/17 0554 01/30/17 0557  Weight: 141 lb 8.6 oz (64.2 kg) 133 lb 13.1 oz (60.7 kg) 136 lb 14.5 oz (62.1 kg)    Telemetry    Sinus rhythm and sinus tachycardia. Personally reviewed.  ECG    Tracing from 01/26/2017 showed sinus rhythm with biatrial enlargement, IVCD, and repolarization abnormalities. Personally reviewed.  Physical Exam   GEN: Cachectic appearing elderly male. Neck: No JVD. Cardiac:  Indistinct PMI, RRR, no gallop.  Respiratory:  Coarse breath sounds, mild expiratory wheeze. GI: Soft, nontender, bowel sounds present. MS: No pitting edema. Neuro:  Nonfocal. Psych: Alert and oriented x  3.  Labs    Chemistry  Recent Labs Lab 01/27/17 0744 01/28/17 0320 01/29/17 0304 01/30/17 0352  NA 140 139 137 135  K 4.9 4.1 3.9 4.4  CL 104 101 98* 98*  CO2 24 28 30 29   GLUCOSE 85 102* 106* 109*  BUN 22* 21* 20 23*  CREATININE 1.40* 1.55* 1.63* 1.73*  CALCIUM 8.7* 8.9 8.8* 8.9  PROT 6.0*  --   --   --   ALBUMIN 3.0*  --   --   --   AST 43*  --   --   --   ALT 27  --   --   --   ALKPHOS 68  --   --   --   BILITOT 1.8*  --   --   --   GFRNONAA 52* 46* 43* 40*  GFRAA 60* 53* 50* 46*  ANIONGAP 12 10 9 8      Hematology  Recent Labs Lab 01/28/17 0320 01/29/17 0304 01/30/17 0352  WBC 7.3 6.8 7.3  RBC 4.40 4.37 4.56  HGB 13.7 13.7 13.9  HCT 39.7 40.0 41.6  MCV 90.2 91.5 91.2  MCH 31.1 31.4 30.5  MCHC 34.5 34.3  33.4  RDW 13.8 13.9 13.7  PLT 214 226 218    Cardiac EnzymesNo results for input(s): TROPONINI in the last 168 hours.   Recent Labs Lab 01/26/17 2206  TROPIPOC 0.10*     BNP  Recent Labs Lab 01/26/17 2152  BNP 3,641.0*     Radiology    No results found.  Cardiac Studies   Echocardiogram 01/04/2017: Study Conclusions  - Left ventricle: The cavity size was normal. Wall thickness was   increased in a pattern of moderate LVH. There was severe focal   basal hypertrophy of the septum. Coarse trabeculation at the LV   apex with multiple false tendinae. Does not meet diagnostic   criteria for non-compaction. LV apical smoke noted, but no   thrombus. Systolic function was severely reduced. The estimated   ejection fraction was 15%. Diffuse hypokinesis. The study is not   technically sufficient to allow evaluation of LV diastolic   function. - Aortic valve: Trileaflet. Sclerosis without stenosis. There was   mild regurgitation. - Mitral valve: Mildly thickened leaflets . There was moderate to   severe regurgitation. - Left atrium: Moderately dilated. - Right ventricle: The cavity size was normal. Systolic function   was low normal. -  Right atrium: The atrium was mildly dilated. - Atrial septum: No defect or patent foramen ovale was identified. - Tricuspid valve: There was moderate to severe regurgitation. - Pulmonary arteries: Dilated. PA peak pressure: 73 mm Hg (S). - Inferior vena cava: The vessel was dilated. The respirophasic   diameter changes were blunted (< 50%), consistent with elevated   central venous pressure. - Pericardium, extracardiac: A trivial pericardial effusion was   identified posterior to the heart. Tamponade physiology cannot be   evaluated in the setting of severe pulmonary hypertension.   Clinical correlation is always advised.  Impressions:  - LVEF 15%, severe global hypokinesis, moderate LVH, coarse   trabeculation of the LV apex with numerous false tendinae of the   left ventricle, very stagnant blood flow at the LV apex with   smoke but no obvious LV thrombus - Definity contrast was given,   again, noting stagnant apical blood flow - this could suggest   recent thrombus and certainly high risk for apical thrombus   formation. Other findings include aortic sclerosis with mild AI,   moderate to severe MR, moderate LAE, mild RAE, moderate to severe   TR, moderate to severe pulmonary hypertension (RVSP 73 mmHg),   dilated IVC, trivial posterior pericardial effusion - tamponade   physiology cannot be excluded given the degree of pulmonary   hypertension.  Patient Profile     66 y.o. male with a history of chronic systolic heart failure with severe cardiomyopathy and LVEF currently 15%, possible loosely organized LV apical thrombus, severe pulmonary hypertension in association with severe mitral and tricuspid regurgitation, CKD stage III, hypertension, tobacco abuse, and schizophrenia. He is admitted with acute on chronic systolic heart failure.  Assessment & Plan    1. Acute on chronic systolic heart failure. He has diuresed significantly during stay, presently diuretics are on hold with  increasing creatinine. Symptomatically speaking, he feels less short of breath but remains fairly tenuous.  2. Severe cardiomyopathy with LVEF approximately 15%, global hypokinesis suggestive of nonischemic etiology.  3. Severe pulmonary hypertension, RVSP 73 mmHg by echocardiogram.  4. Valvular heart disease including severe mitral and tricuspid regurgitation.  5. Possible loosely organized LV apical thrombus/smoke. Felt to be poor candidate for longterm anticoagulation per Dr.  Ross notes.  6. CKD, stage 3. Current creatinine up to 1.7.  7. Tobacco abuse.  Increase hydralazine to 25 mg twice daily, continue Coreg, Imdur, and ASA. Would not initiate ACE inhibitor or ARB at this time with increasing creatinine. Overall fairly tenuous status with likely low output symptoms. He states that he does not want to undergo cardiac catheterization at this time. Although consultation with the heart failure service could be considered, he is unlikely to be a candidate for many advanced therapies.  Signed, Rozann Lesches, MD  01/30/2017, 8:16 AM

## 2017-01-30 NOTE — Progress Notes (Addendum)
PROGRESS NOTE    Gerald Nelson  JOI:[id number] DOB: [birth year] DOA: 01/26/2017 PCP: [name], MD   Brief Narrative:  66 y.o.BM PMHx TIA, Schizophrenia, Tobacco Abuse COPD, HTN, Chronic Systolic CHF, Medical Noncompliance.Recently admitted 3 weeks ago for shortness of breath and was found to have CHF with 2-D echo showing EF of 15%.  Presents to the ER with shortness of breath. During last day patient left AGAINST MEDICAL ADVICE. Patient states he has not been taking any medications except for aspirin. Patient's shortness of breath has progressively worsened since discharge increasing on minimal exertion. Denies any chest pain productive cough fever or chills.   Subjective: 7/29 A/O 4, negative CP, positive occasional SOB, negative N/V, negative abdominal pain.       Assessment & Plan:   Principal Problem:   Schizophrenia ([place]) Active Problems:   CKD (chronic kidney disease), stage III   Acute systolic CHF (congestive heart failure) (HCC)   CHF (congestive heart failure) (HCC)   Acute respiratory failure with hypoxia (HCC)   Hypertensive urgency   Acute respiratory failure with hypoxia -Multifactorial to include acute on chronic systolic CHF, hypertensive urgency, COPD exacerbation  -Titrate O2 to maintain SPO2 89 -93% -Xopenex BID - 7/29 short course Solu-Medrol 60 mg daily  -Continue to hold antibiotics We will hold on starting antibiotics at this time. Negative fever, negative leukocytosis. However monitor closely -Ambulate patient BID  Acute on Chronic systolic CHF -Patient does not know his base weight nor does he weigh himself daily. -DC'd Lasix secondary to rising creatinine, continues to rise. Believe has not reached peak yet. -Metoprolol --> Coreg 3.125 mg BID per cardiology -Patient with acute on CKD stage III hold on starting ACEI/ARB -Hydralazine 25 mg BID per cardiology -Imdur 30 mg daily  -Wean NTG drip to off as tolerated -Strict INO since  admission - 7.5  L -Daily weight Filed Weights   01/28/17 0439 01/29/17 0554 01/30/17 0557  Weight: 141 lb 8.6 oz (64.2 kg) 133 lb 13.1 oz (60.7 kg) 136 lb 14.5 oz (62.1 kg)  -Per cardiology patient scheduled for Right/Left heart cardiac catheterization on Monday  Pulmonary hypertension -Severe pulmonary hypertension by previous echocardiogram see results below. -See CHF  Elevated troponin -Most likely secondary to CHF decompensation  Acute on CKD stage III(baseline Cr 1.3) -Hold ACEI/ARB -Hold nephrotoxic medication -Monitor creatinine closely  Hx TIA -Aspirin  Schizophrenia undifferentiated -Per psychiatry 7/27 due for Haldol Decanoate 100 mg IM on 02/11/2017  -Per psychiatry patient is stable however patient is PHYSICALLY HOWLING AT [place] roaming up and down the hallways on a constant basis during the day, requesting/attempting to leave for smoke throughout the day. In addition on multiple admissions patient has left AMA halfway through a treatment protocol. Therefore will Increase Haldol 2 mg  QID  scheduled. -If patient's actions worsen will need to reconsult psychiatry and request additional agents/adjustment in medications in order to stabilize patient for hospital environment.  Tobacco abuse -Counseled all sequelae of continuing to smoke to include MI, stroke, death. -Patient agreed not to leave and smoke a cigarette if we provided Nicotine patch     DVT prophylaxis: Heparin drip Code Status: Full Family Communication: None Disposition Plan:  per cardiology      Consultants:  Cardiology Dr.Paula Ross    Procedures/Significant Events:  7/3 Echocardiogram:- Left ventricle: moderate LVH. There was severe focal basal hypertrophy of the septum. Coarse trabeculation at the LV apex with multiple false tendinae. -LVEF=15%. Diffuse hypokinesis.  - Mitral  valve: moderate to severe regurgitation. - Left atrium: Moderately dilated. - Tricuspid valve: moderate  to severe regurgitation. - Pulmonary arteries: Dilated. PA peak pressure: 73 mm Hg (S).   VENTILATOR SETTINGS:    Cultures 7/26 MRSA by PCR negative 7/26 Sputum pending    Antimicrobials: Anti-infectives    None       Devices   LINES / TUBES:      Continuous Infusions: . heparin 1,250 Units/hr (01/29/17 2000)  . nitroGLYCERIN Stopped (01/29/17 1402)     Objective: Vitals:   01/29/17 2107 01/29/17 2334 01/30/17 0327 01/30/17 0557  BP: (!) 137/98 (!) 147/106 (!) 134/94   Pulse:  88 81   Resp:  (!) 22 11   Temp:  98.2 F (36.8 C) 98 F (36.7 C)   TempSrc:  Oral Oral   SpO2:  99% 99%   Weight:    136 lb 14.5 oz (62.1 kg)  Height:        Intake/Output Summary (Last 24 hours) at 01/30/17 0721 Last data filed at 01/30/17 0600  Gross per 24 hour  Intake          1693.15 ml  Output             2325 ml  Net          -631.85 ml   Filed Weights   01/28/17 0439 01/29/17 0554 01/30/17 0557  Weight: 141 lb 8.6 oz (64.2 kg) 133 lb 13.1 oz (60.7 kg) 136 lb 14.5 oz (62.1 kg)    Examination: Physical Exam: Vitals:   01/29/17 2107 01/29/17 2334 01/30/17 0327 01/30/17 0557  BP: (!) 137/98 (!) 147/106 (!) 134/94   Pulse:  88 81   Resp:  (!) 22 11   Temp:  98.2 F (36.8 C) 98 F (36.7 C)   TempSrc:  Oral Oral   SpO2:  99% 99%   Weight:    136 lb 14.5 oz (62.1 kg)  Height:        Wt Readings from Last 3 Encounters:  01/30/17 136 lb 14.5 oz (62.1 kg)  01/06/17 145 lb 14.4 oz (66.2 kg)  12/12/13 140 lb 10.5 oz (63.8 kg)    General: A/O 4, No acute respiratory distress, cachectic, poorly kept Lungs: diffuse expiratory wheezing, negative crackles  Cardiovascular: Regular rate and rhythm without murmur gallop or rub normal S1 and S2 Abdomen: negative abdominal pain, nondistended, positive soft, bowel sounds, no rebound, no ascites, no appreciable mass Extremities: No significant cyanosis, clubbing, or edema bilateral lower extremities Skin: Negative  rashes, lesions, ulcers Psychiatric:  Negative depression, negative anxiety, negative fatigue, negative mania  Central nervous system:  Cranial nerves II through XII intact, tongue/uvula midline, all extremities muscle strength 5/5, sensation intact throughout, negative dysarthria, negative expressive aphasia, negative receptive aphasia.  .     Data Reviewed: Care during the described time interval was provided by me .  I have reviewed this patient's available data, including medical history, events of note, physical examination, and all test results as part of my evaluation. I have personally reviewed and interpreted all radiology studies.  CBC:  Recent Labs Lab 01/26/17 2152 01/27/17 0744 01/28/17 0320 01/29/17 0304 01/30/17 0352  WBC 7.5 7.5 7.3 6.8 7.3  NEUTROABS  --  5.2  --   --   --   HGB 13.8 14.2 13.7 13.7 13.9  HCT 39.8 41.2 39.7 40.0 41.6  MCV 90.2 90.7 90.2 91.5 91.2  PLT 224 195 214 226 218   Basic  Metabolic Panel:  Recent Labs Lab 01/26/17 2152 01/27/17 0243 01/27/17 0744 01/28/17 0320 01/29/17 0304 01/30/17 0352  NA 139  --  140 139 137 135  K 4.1  --  4.9 4.1 3.9 4.4  CL 106  --  104 101 98* 98*  CO2 26  --  24 28 30 29   GLUCOSE 134*  --  85 102* 106* 109*  BUN 22*  --  22* 21* 20 23*  CREATININE 1.35*  --  1.40* 1.55* 1.63* 1.73*  CALCIUM 8.9  --  8.7* 8.9 8.8* 8.9  MG  --  1.7  --   --  1.8 2.0   GFR: Estimated Creatinine Clearance: 37.9 mL/min (A) (by C-G formula based on SCr of 1.73 mg/dL (H)). Liver Function Tests:  Recent Labs Lab 01/27/17 0744  AST 43*  ALT 27  ALKPHOS 68  BILITOT 1.8*  PROT 6.0*  ALBUMIN 3.0*   No results for input(s): LIPASE, AMYLASE in the last 168 hours. No results for input(s): AMMONIA in the last 168 hours. Coagulation Profile: No results for input(s): INR, PROTIME in the last 168 hours. Cardiac Enzymes: No results for input(s): CKTOTAL, CKMB, CKMBINDEX, TROPONINI in the last 168 hours. BNP (last 3  results) No results for input(s): PROBNP in the last 8760 hours. HbA1C: No results for input(s): HGBA1C in the last 72 hours. CBG: No results for input(s): GLUCAP in the last 168 hours. Lipid Profile: No results for input(s): CHOL, HDL, LDLCALC, TRIG, CHOLHDL, LDLDIRECT in the last 72 hours. Thyroid Function Tests: No results for input(s): TSH, T4TOTAL, FREET4, T3FREE, THYROIDAB in the last 72 hours. Anemia Panel: No results for input(s): VITAMINB12, FOLATE, FERRITIN, TIBC, IRON, RETICCTPCT in the last 72 hours. Urine analysis:    Component Value Date/Time   COLORURINE YELLOW 12/11/2013 1733   APPEARANCEUR CLEAR 12/11/2013 1733   LABSPEC 1.014 12/11/2013 1733   PHURINE 5.5 12/11/2013 1733   GLUCOSEU NEGATIVE 12/11/2013 1733   HGBUR NEGATIVE 12/11/2013 1733   BILIRUBINUR NEGATIVE 12/11/2013 1733   KETONESUR NEGATIVE 12/11/2013 1733   PROTEINUR NEGATIVE 12/11/2013 1733   UROBILINOGEN 1.0 12/11/2013 1733   NITRITE NEGATIVE 12/11/2013 1733   LEUKOCYTESUR NEGATIVE 12/11/2013 1733   Sepsis Labs: @LABRCNTIP (procalcitonin:4,lacticidven:4)  ) Recent Results (from the past 240 hour(s))  MRSA PCR Screening     Status: None   Collection Time: 01/27/17  3:32 AM  Result Value Ref Range Status   MRSA by PCR NEGATIVE NEGATIVE Final    Comment:        The GeneXpert MRSA Assay (FDA approved for NASAL specimens only), is one component of a comprehensive MRSA colonization surveillance program. It is not intended to diagnose MRSA infection nor to guide or monitor treatment for MRSA infections.   Culture, expectorated sputum-assessment     Status: None   Collection Time: 01/27/17 11:25 AM  Result Value Ref Range Status   Specimen Description EXPECTORATED SPUTUM  Final   Special Requests NONE  Final   Sputum evaluation THIS SPECIMEN IS ACCEPTABLE FOR SPUTUM CULTURE  Final   Report Status 01/27/2017 FINAL  Final  Culture, respiratory (NON-Expectorated)     Status: None (Preliminary  result)   Collection Time: 01/27/17 11:25 AM  Result Value Ref Range Status   Specimen Description EXPECTORATED SPUTUM  Final   Special Requests NONE Reflexed from [id number]  Final   Gram Stain   Final    MODERATE WBC PRESENT,BOTH PMN AND MONONUCLEAR FEW SQUAMOUS EPITHELIAL CELLS PRESENT FEW GRAM POSITIVE COCCI  FEW GRAM POSITIVE RODS RARE GRAM NEGATIVE RODS    Culture CULTURE REINCUBATED FOR BETTER GROWTH  Final   Report Status PENDING  Incomplete         Radiology Studies: No results found.      Scheduled Meds: . aspirin EC  81 mg Oral Daily  . carvedilol  3.125 mg Oral BID WC  . dextromethorphan-guaiFENesin  1 tablet Oral BID  . hydrALAZINE  20 mg Oral BID  . isosorbide mononitrate  30 mg Oral Daily  . levalbuterol  1.25 mg Nebulization BID  . nicotine  21 mg Transdermal Daily   Continuous Infusions: . heparin 1,250 Units/hr (01/29/17 2000)  . nitroGLYCERIN Stopped (01/29/17 1402)     LOS: 4 days    Time spent: 40 minutes    Bunyan Brier, Geraldo Docker, MD [facility] Pager [phone]   If 7PM-7AM, please contact night-coverage [email or web address] Password [email or web address] 01/30/2017, 7:21 AM

## 2017-01-30 NOTE — Progress Notes (Signed)
Pt walked in hall 1/2 way around unit.  Tolerated well.  Gerald Nelson T

## 2017-01-31 ENCOUNTER — Encounter (HOSPITAL_COMMUNITY)
Admission: EM | Discharge status: Against Medical Advice | Payer: Self-pay | Source: Home / Self Care | Attending: Internal Medicine

## 2017-01-31 LAB — CBC
HEMATOCRIT: 42.3 % (ref 39.0–52.0)
Hemoglobin: 14.6 g/dL (ref 13.0–17.0)
MCH: 31.3 pg (ref 26.0–34.0)
MCHC: 34.5 g/dL (ref 30.0–36.0)
MCV: 90.8 fL (ref 78.0–100.0)
PLATELETS: 220 10*3/uL (ref 150–400)
RBC: 4.66 MIL/uL (ref 4.22–5.81)
RDW: 13.6 % (ref 11.5–15.5)
WBC: 7.8 10*3/uL (ref 4.0–10.5)

## 2017-01-31 LAB — BASIC METABOLIC PANEL
ANION GAP: 8 (ref 5–15)
BUN: 23 mg/dL — AB (ref 6–20)
CALCIUM: 9.3 mg/dL (ref 8.9–10.3)
CO2: 30 mmol/L (ref 22–32)
Chloride: 100 mmol/L — ABNORMAL LOW (ref 101–111)
Creatinine, Ser: 1.5 mg/dL — ABNORMAL HIGH (ref 0.61–1.24)
GFR calc Af Amer: 55 mL/min — ABNORMAL LOW (ref >60)
GFR calc non Af Amer: 47 mL/min — ABNORMAL LOW (ref >60)
GLUCOSE: 132 mg/dL — AB (ref 65–99)
Potassium: 5.1 mmol/L (ref 3.5–5.1)
Sodium: 138 mmol/L (ref 135–145)

## 2017-01-31 LAB — HEPARIN LEVEL (UNFRACTIONATED): Heparin Unfractionated: 0.42 IU/mL (ref 0.30–0.70)

## 2017-01-31 LAB — PROTIME-INR
INR: 0.98
PROTHROMBIN TIME: 13 s (ref 11.4–15.2)

## 2017-01-31 LAB — MAGNESIUM: Magnesium: 2 mg/dL (ref 1.7–2.4)

## 2017-01-31 SURGERY — RIGHT/LEFT HEART CATH AND CORONARY ANGIOGRAPHY
Anesthesia: LOCAL

## 2017-01-31 MED ORDER — HALOPERIDOL LACTATE 5 MG/ML IJ SOLN
1.0000 mg | Freq: Four times a day (QID) | INTRAMUSCULAR | Status: DC | PRN
  Administered 2017-01-31: 1 mg via INTRAVENOUS
  Filled 2017-01-31: qty 1

## 2017-01-31 MED ORDER — HYDROXYZINE HCL 25 MG PO TABS
25.0000 mg | ORAL_TABLET | ORAL | Status: DC | PRN
Start: 2017-01-31 — End: 2017-01-31

## 2017-01-31 NOTE — Progress Notes (Signed)
Patient is extremely agitated, demanding to speak to MD. Patient requesting cigarettes. Ambulated patient in the hall. Discussed nicotine patch with patient. Patient still wants to speak to MD. MD paged. Will continue to monitor.

## 2017-01-31 NOTE — Discharge Summary (Signed)
   PT LEFT AMA SUMMARY  Gerald Nelson MRN - [id number] DOB - December 27, 1950  Date of Admission - 01/26/2017 Date LEFT AMA:   Attending Physician:  Cherene Altes  Patient's PCP:  [name], MD  Disposition: LEFT AMA  Follow-up Appts:  Not able to be arranged or discussed as pt LEFT AMA  Diagnoses at time pt LEFT AMA: Acute hypoxic respiratory failure Acute on chronic systolic congestive heart failure Acute COPD exacerbation - tobacco abuse Pulmonary hypertension Hypertensive urgency Acute kidney injury on chronic kidney disease stage III History of TIA  Schizophrenia Serial noncompliance  Initial presentation: 66yo M w/ Hx TIA, Schizophrenia, Tobacco Abuse, COPD, HTN, Chronic Systolic CHF, and Noncompliance who was admitted 01/04/17 for SOB and was found to have an EF of 15%.  He left AMA before his workup and tx was completed.  He returned to the ED on 7/25 w/ SOB.  He admitted to not taking any medication since he left AMA.    Hospital Course: Listed below are the active problems present, and the status of the care of these problems, at the time the pt decided to LEAVE AMA:   Acute hypoxic respiratory failure due to acute on chronic systolic congestive heart failure  Acute COPD exacerbation - tobacco abuse  Pulmonary hypertension  Hypertensive urgency  Acute kidney injury on chronic kidney disease stage III Baseline creatinine 1.3 - no ACE inhibitor/ARB  History of TIA   Schizophrenia Evaluated by psychiatry during this hospital stay who reported the patient is stable and does not meet criteria for psychiatric inpatient admission - patient due for Haldol decanoate 100 mg IM 02/11/17  Serial noncompliance  I presented to the patient's bedside and spoke clearly and directly to him.  He was alert oriented and conversant.  He informed me that he had no interest in having a cardiac catheterization.  I explained to him that I understood and that this was not  necessary frustrated continue to treat him.  He didn't inform me that he planned to go home.  I attempted to convince him to stay here.  I explained to him that he would simply re-developed severe shortness of breath and almost assuredly have to present back to the emergency room if he did not suffer a worse face such as death.  He voiced understanding but held firmly to his desire to leave despite my encouraging him to stay.  He chose to leave [facility] [place].    Medication List    Unable to be finalized as pt LEFT AMA  Day of Discharge Wt Readings from Last 3 Encounters:  01/31/17 61.9 kg (136 lb 7.4 oz)  01/06/17 66.2 kg (145 lb 14.4 oz)  12/12/13 63.8 kg (140 lb 10.5 oz)   Temp Readings from Last 3 Encounters:  01/31/17 97.6 F (36.4 C) (Axillary)  01/06/17 97.9 F (36.6 C) (Oral)  12/12/13 97.7 F (36.5 C)   BP Readings from Last 3 Encounters:  01/31/17 (!) 174/128  01/06/17 (!) 147/108  12/12/13 127/87   Pulse Readings from Last 3 Encounters:  01/31/17 95  01/06/17 90  12/12/13 85    Physical Exam: Exam not able to be completed at time of d/c as pt LEFT AMA  11:20 AM 01/31/17  Cherene Altes, MD [facility] Office  [phone] Pager [phone]  On-Call/Text Page:      [email or web address]      password [facility]

## 2017-01-31 NOTE — Progress Notes (Signed)
Patient wanting to leave the hospital against medical advice. Paged MD to bedside. After MD and bedside RN tried to convince patient to stay for treatment, patient still chose to leave AMA. Patient signed AMA form. Peripheral IV's removed with catheters intact. Clean dry dressing applied. Patient dressed and left the unit.

## 2017-04-05 ENCOUNTER — Encounter (HOSPITAL_COMMUNITY): Payer: Self-pay | Admitting: Emergency Medicine

## 2017-04-05 ENCOUNTER — Inpatient Hospital Stay (HOSPITAL_COMMUNITY)
Admission: EM | Admit: 2017-04-05 | Discharge: 2017-04-07 | DRG: 291 | Discharge status: Against Medical Advice | Payer: Medicare Other | Attending: Internal Medicine | Admitting: Internal Medicine

## 2017-04-05 ENCOUNTER — Other Ambulatory Visit: Payer: Self-pay

## 2017-04-05 ENCOUNTER — Emergency Department (HOSPITAL_COMMUNITY): Payer: Medicare Other

## 2017-04-05 DIAGNOSIS — F209 Schizophrenia, unspecified: Secondary | ICD-10-CM | POA: Diagnosis present

## 2017-04-05 DIAGNOSIS — I130 Hypertensive heart and chronic kidney disease with heart failure and stage 1 through stage 4 chronic kidney disease, or unspecified chronic kidney disease: Principal | ICD-10-CM | POA: Diagnosis present

## 2017-04-05 DIAGNOSIS — Z960 Presence of urogenital implants: Secondary | ICD-10-CM | POA: Diagnosis present

## 2017-04-05 DIAGNOSIS — Z8673 Personal history of transient ischemic attack (TIA), and cerebral infarction without residual deficits: Secondary | ICD-10-CM | POA: Diagnosis not present

## 2017-04-05 DIAGNOSIS — I509 Heart failure, unspecified: Secondary | ICD-10-CM

## 2017-04-05 DIAGNOSIS — M1990 Unspecified osteoarthritis, unspecified site: Secondary | ICD-10-CM | POA: Diagnosis not present

## 2017-04-05 DIAGNOSIS — I248 Other forms of acute ischemic heart disease: Secondary | ICD-10-CM

## 2017-04-05 DIAGNOSIS — N183 Chronic kidney disease, stage 3 unspecified: Secondary | ICD-10-CM | POA: Diagnosis present

## 2017-04-05 DIAGNOSIS — Z8249 Family history of ischemic heart disease and other diseases of the circulatory system: Secondary | ICD-10-CM

## 2017-04-05 DIAGNOSIS — Z9119 Patient's noncompliance with other medical treatment and regimen: Secondary | ICD-10-CM

## 2017-04-05 DIAGNOSIS — F17210 Nicotine dependence, cigarettes, uncomplicated: Secondary | ICD-10-CM | POA: Diagnosis not present

## 2017-04-05 DIAGNOSIS — Z7982 Long term (current) use of aspirin: Secondary | ICD-10-CM

## 2017-04-05 DIAGNOSIS — Z882 Allergy status to sulfonamides status: Secondary | ICD-10-CM | POA: Diagnosis not present

## 2017-04-05 DIAGNOSIS — I5023 Acute on chronic systolic (congestive) heart failure: Secondary | ICD-10-CM | POA: Diagnosis present

## 2017-04-05 DIAGNOSIS — I272 Pulmonary hypertension, unspecified: Secondary | ICD-10-CM | POA: Diagnosis present

## 2017-04-05 DIAGNOSIS — I10 Essential (primary) hypertension: Secondary | ICD-10-CM | POA: Diagnosis present

## 2017-04-05 DIAGNOSIS — Z9114 Patient's other noncompliance with medication regimen: Secondary | ICD-10-CM | POA: Diagnosis not present

## 2017-04-05 DIAGNOSIS — J449 Chronic obstructive pulmonary disease, unspecified: Secondary | ICD-10-CM | POA: Diagnosis present

## 2017-04-05 DIAGNOSIS — Z79899 Other long term (current) drug therapy: Secondary | ICD-10-CM | POA: Diagnosis not present

## 2017-04-05 HISTORY — DX: Heart failure, unspecified: I50.9

## 2017-04-05 HISTORY — DX: Unspecified osteoarthritis, unspecified site: M19.90

## 2017-04-05 LAB — BASIC METABOLIC PANEL
Anion gap: 4 — ABNORMAL LOW (ref 5–15)
BUN: 18 mg/dL (ref 6–20)
CHLORIDE: 101 mmol/L (ref 101–111)
CO2: 27 mmol/L (ref 22–32)
Calcium: 9 mg/dL (ref 8.9–10.3)
Creatinine, Ser: 1.87 mg/dL — ABNORMAL HIGH (ref 0.61–1.24)
GFR calc Af Amer: 42 mL/min — ABNORMAL LOW (ref >60)
GFR, EST NON AFRICAN AMERICAN: 36 mL/min — AB (ref >60)
GLUCOSE: 126 mg/dL — AB (ref 65–99)
POTASSIUM: 3.3 mmol/L — AB (ref 3.5–5.1)
Sodium: 132 mmol/L — ABNORMAL LOW (ref 135–145)

## 2017-04-05 LAB — CBC
HEMATOCRIT: 39.8 % (ref 39.0–52.0)
Hemoglobin: 13.6 g/dL (ref 13.0–17.0)
MCH: 31.1 pg (ref 26.0–34.0)
MCHC: 34.2 g/dL (ref 30.0–36.0)
MCV: 90.9 fL (ref 78.0–100.0)
Platelets: 204 10*3/uL (ref 150–400)
RBC: 4.38 MIL/uL (ref 4.22–5.81)
RDW: 13.6 % (ref 11.5–15.5)
WBC: 7.8 10*3/uL (ref 4.0–10.5)

## 2017-04-05 LAB — I-STAT TROPONIN, ED: Troponin i, poc: 0.16 ng/mL (ref 0.00–0.08)

## 2017-04-05 LAB — BRAIN NATRIURETIC PEPTIDE: B Natriuretic Peptide: 2689.3 pg/mL — ABNORMAL HIGH (ref 0.0–100.0)

## 2017-04-05 MED ORDER — FUROSEMIDE 10 MG/ML IJ SOLN
40.0000 mg | INTRAMUSCULAR | Status: AC
  Administered 2017-04-05: 40 mg via INTRAVENOUS
  Filled 2017-04-05: qty 4

## 2017-04-05 MED ORDER — ALBUTEROL SULFATE (2.5 MG/3ML) 0.083% IN NEBU
INHALATION_SOLUTION | RESPIRATORY_TRACT | Status: AC
  Administered 2017-04-05: 5 mg via RESPIRATORY_TRACT
  Filled 2017-04-05: qty 6

## 2017-04-05 MED ORDER — ALBUTEROL SULFATE (2.5 MG/3ML) 0.083% IN NEBU
5.0000 mg | INHALATION_SOLUTION | Freq: Once | RESPIRATORY_TRACT | Status: AC
  Administered 2017-04-05: 5 mg via RESPIRATORY_TRACT

## 2017-04-05 MED ORDER — ASPIRIN 81 MG PO CHEW
324.0000 mg | CHEWABLE_TABLET | Freq: Once | ORAL | Status: AC
  Administered 2017-04-05: 324 mg via ORAL
  Filled 2017-04-05: qty 4

## 2017-04-05 NOTE — ED Notes (Signed)
Pt reports SOB X several months, states hx of COPD and asthma. States he smokes 2 packs of cigs/day.   Breathing is unlabored and as soon as RN walks into room he starts panting and forcing himself to wheeze.

## 2017-04-05 NOTE — ED Provider Notes (Signed)
[place] DEPT Provider Note   CSN: [id number] Arrival date & time: 04/05/17  2122     History   Chief Complaint Chief Complaint  Patient presents with  . Shortness of Breath    HPI Gerald Nelson is a 66 y.o. male.  The history is provided by the patient and medical records.    66 y.o. M with hx of COPD, arthritis, hernia, HTN, Schizophrenia, CHF, CKD, hx of CVA, presenting to the ED for SOB.  States he has been intermittently SOB for the past 6 months, worse over the past 2 days.  States he is SOB at all times, worse with exertional activities or lying flat.  Denies chest pain.  No significant weight gain.  Denies swelling of the legs.  Does report chronic cough without fever, chills.  No sick contacts.  States does have hx of COPD and continues to smoke daily.  Did use his inhaler earlier today with some improvement.  Has hx of CHF, estimated EF of 15% on last echo in July 2018.   Past Medical History:  Diagnosis Date  . Arthritis   . COPD (chronic obstructive pulmonary disease) ([place])   . Hernia, hiatal   . Hypertension   . Schizophrenia [facility])     Patient Active Problem List   Diagnosis Date Noted  . Acute respiratory failure with hypoxia ([employer]) 01/27/2017  . Hypertensive urgency 01/27/2017  . CHF (congestive heart failure) ([place]) 01/26/2017  . Acute systolic CHF (congestive heart failure) ([place])   . CKD (chronic kidney disease), stage III ([place]) 01/04/2017  . Essential hypertension 01/04/2017  . Acute CHF (congestive heart failure) ([place]) 01/03/2017  . CVA (cerebral infarction) 12/12/2013  . Hiatal hernia 12/06/2012  . Syncope 12/06/2012  . Schizophrenia [facility])     Past Surgical History:  Procedure Laterality Date  . PENILE PROSTHESIS IMPLANT         Home Medications    Prior to Admission medications   Medication Sig Start Date End Date Taking? Authorizing Provider  clopidogrel (PLAVIX) 75 MG tablet Take 1 tablet (75 mg total) by mouth daily with  breakfast. Patient not taking: Reported on 01/26/2017 12/12/13   Hosie Poisson, MD  esomeprazole ([place]) 20 MG packet Take 20 mg by mouth daily before breakfast. Patient not taking: Reported on 01/26/2017 12/05/13   Triplett, Tammy, PA-C  haloperidol decanoate (HALDOL DECANOATE) 100 MG/ML injection Inject 100 mg into the muscle every 30 (thirty) days.  01/07/17   [provider]    Family History Family History  Problem Relation Age of Onset  . Hypertension Mother   . Hypertension Father     Social History Social History  Substance Use Topics  . Smoking status: Current Every Day Smoker    Packs/day: 1.50    Types: Cigarettes  . Smokeless tobacco: Never Used  . Alcohol use No     Allergies   Sulfa antibiotics   Review of Systems Review of Systems  Respiratory: Positive for cough and shortness of breath.   All other systems reviewed and are negative.    Physical Exam Updated Vital Signs BP (!) 155/100 (BP Location: Right Arm)   Pulse (!) 104   Temp 98.1 F (36.7 C) (Oral)   Resp 16   Ht 5\' 10"  (1.778 m)   Wt 74.4 kg (164 lb)   SpO2 96%   BMI 23.53 kg/m   Physical Exam  Constitutional: He is oriented to person, place, and time. He appears well-developed and well-nourished.  HENT:  Head: Normocephalic and atraumatic.  Mouth/Throat: Oropharynx is clear and moist.  Eyes: Pupils are equal, round, and reactive to light. Conjunctivae and EOM are normal.  Neck: Normal range of motion. JVD present.  Cardiovascular: Normal rate, regular rhythm and normal heart sounds.   Pulmonary/Chest: Effort normal. No respiratory distress. He has no wheezes. He has rales.  Rales at the bases, some mild intermixed wheezes, no acute distress, able to speak in sentences, O2 sats 95% during exam  Abdominal: Soft. Bowel sounds are normal.  Musculoskeletal: Normal range of motion.  1+ pretibial edema  Neurological: He is alert and oriented to person, place, and time.  Skin: Skin is  warm and dry.  Psychiatric: He has a normal mood and affect.  Nursing note and vitals reviewed.    ED Treatments / Results  Labs (all labs ordered are listed, but only abnormal results are displayed) Labs Reviewed  BASIC METABOLIC PANEL - Abnormal; Notable for the following:       Result Value   Sodium 132 (*)    Potassium 3.3 (*)    Glucose, Bld 126 (*)    Creatinine, Ser 1.87 (*)    GFR calc non Af Amer 36 (*)    GFR calc Af Amer 42 (*)    Anion gap 4 (*)    All other components within normal limits  I-STAT TROPONIN, ED - Abnormal; Notable for the following:    Troponin i, poc 0.16 (*)    All other components within normal limits  CBC  BRAIN NATRIURETIC PEPTIDE    EKG  EKG Interpretation  Date/Time:  Tuesday April 05 2017 21:34:03 EDT Ventricular Rate:  104 PR Interval:  156 QRS Duration: 102 QT Interval:  380 QTC Calculation: 499 R Axis:   121 Text Interpretation:  Sinus tachycardia with Premature atrial complexes Right atrial enlargement Right axis deviation Pulmonary disease pattern ST & T wave abnormality, consider lateral ischemia Abnormal ECG no sig change from previous Confirmed by Charlesetta Shanks [id number]) on 04/05/2017 11:07:15 PM       Radiology Dg Chest 2 View  Result Date: 04/05/2017 CLINICAL DATA:  Shortness of breath EXAM: CHEST  2 VIEW COMPARISON:  01/26/2017 FINDINGS: Hyperinflation. Tiny bilateral pleural effusions. Moderate-to-marked cardiomegaly with globular cardiac configuration. Vascular congestion and mild diffuse interstitial opacity, suspicious for mild edema. No pneumothorax. Degenerative changes of the spine. IMPRESSION: Cardiomegaly with vascular congestion and mild diffuse interstitial edema. Tiny pleural effusions. Electronically Signed   By: Donavan Foil M.D.   On: 04/05/2017 22:12    Procedures Procedures (including critical care time)  CRITICAL CARE Performed by: Larene Pickett   Total critical care time: 40  minutes  Critical care time was exclusive of separately billable procedures and treating other patients.  Critical care was necessary to treat or prevent imminent or life-threatening deterioration.  Critical care was time spent personally by me on the following activities: development of treatment plan with patient and/or surrogate as well as nursing, discussions with consultants, evaluation of patient's response to treatment, examination of patient, obtaining history from patient or surrogate, ordering and performing treatments and interventions, ordering and review of laboratory studies, ordering and review of radiographic studies, pulse oximetry and re-evaluation of patient's condition.   Medications Ordered in ED Medications  nicotine (NICODERM CQ - dosed in mg/24 hours) patch 21 mg (21 mg Transdermal Patch Applied 04/06/17 0131)  furosemide (LASIX) injection 40 mg (not administered)  nitroGLYCERIN (NITROGLYN) 2 % ointment 1 inch (1 inch  Topical Given 04/06/17 0129)  sodium chloride flush (NS) 0.9 % injection 3 mL (3 mLs Intravenous Given 04/06/17 0136)  sodium chloride flush (NS) 0.9 % injection 3 mL (not administered)  0.9 %  sodium chloride infusion (not administered)  acetaminophen (TYLENOL) tablet 650 mg (not administered)  ondansetron (ZOFRAN) injection 4 mg (not administered)  enoxaparin (LOVENOX) injection 40 mg (not administered)  haloperidol decanoate (HALDOL DECANOATE) 100 MG/ML injection 100 mg (not administered)  aspirin tablet 325 mg (not administered)  albuterol (PROVENTIL) (2.5 MG/3ML) 0.083% nebulizer solution 3 mL (not administered)  albuterol (PROVENTIL) (2.5 MG/3ML) 0.083% nebulizer solution 5 mg (5 mg Nebulization Given 04/05/17 2140)  furosemide (LASIX) injection 40 mg (40 mg Intravenous Given 04/05/17 2345)  aspirin chewable tablet 324 mg (324 mg Oral Given 04/05/17 2350)  potassium chloride SA (K-DUR,KLOR-CON) CR tablet 40 mEq (40 mEq Oral Given 04/06/17 0132)      Initial Impression / Assessment and Plan / ED Course  I have reviewed the triage vital signs and the nursing notes.  Pertinent labs & imaging results that were available during my care of the patient were reviewed by me and considered in my medical decision making (see chart for details).  67 year old male here with shortness of breath. Has been intermittent issue over the past 6 months, but worse over the past 2 days. Clinically on exam he appears fluid overloaded with some pretibial edema, Rales at the lung bases, and JVD.  His vitals are stable on room air. He is in no acute distress. Labs obtained from the waiting room with elevated troponin of 0.16. He has had elevated troponin the past.  BNP was added and is nearly 2700. Chest x-ray with some vascular congestion and pulmonary edema noted. Patient given dose of aspirin, IV Lasix.  I suspect his elevated troponin is from demand ischemia from his heart failure. I feel the patient will require admission. He has left AMA several times in the past but reports he is willing to stay for admission.  11:01 PM Discussed with cardiology, Dr. Eula Fried-- feels this is low risk patient.  Recommends medicine admit.  If any issues, they can call him.  Spoke with Dr. Alcario Drought-- will admit to medicine service.  Final Clinical Impressions(s) / ED Diagnoses   Final diagnoses:  Acute on chronic congestive heart failure, unspecified heart failure type [facility])  Demand ischemia [facility])    New Prescriptions New Prescriptions   No medications on file     Larene Pickett, PA-C 04/06/17 5329    Merryl Hacker, MD 04/07/17 [phone]

## 2017-04-06 ENCOUNTER — Encounter (HOSPITAL_COMMUNITY): Payer: Self-pay | Admitting: General Practice

## 2017-04-06 DIAGNOSIS — I248 Other forms of acute ischemic heart disease: Secondary | ICD-10-CM

## 2017-04-06 DIAGNOSIS — I10 Essential (primary) hypertension: Secondary | ICD-10-CM

## 2017-04-06 DIAGNOSIS — Z9119 Patient's noncompliance with other medical treatment and regimen: Secondary | ICD-10-CM | POA: Diagnosis not present

## 2017-04-06 DIAGNOSIS — M1990 Unspecified osteoarthritis, unspecified site: Secondary | ICD-10-CM | POA: Diagnosis not present

## 2017-04-06 DIAGNOSIS — Z882 Allergy status to sulfonamides status: Secondary | ICD-10-CM | POA: Diagnosis not present

## 2017-04-06 DIAGNOSIS — I5023 Acute on chronic systolic (congestive) heart failure: Secondary | ICD-10-CM | POA: Diagnosis not present

## 2017-04-06 DIAGNOSIS — F201 Disorganized schizophrenia: Secondary | ICD-10-CM | POA: Diagnosis not present

## 2017-04-06 DIAGNOSIS — Z8673 Personal history of transient ischemic attack (TIA), and cerebral infarction without residual deficits: Secondary | ICD-10-CM | POA: Diagnosis not present

## 2017-04-06 DIAGNOSIS — Z7982 Long term (current) use of aspirin: Secondary | ICD-10-CM | POA: Diagnosis not present

## 2017-04-06 DIAGNOSIS — I130 Hypertensive heart and chronic kidney disease with heart failure and stage 1 through stage 4 chronic kidney disease, or unspecified chronic kidney disease: Secondary | ICD-10-CM | POA: Diagnosis not present

## 2017-04-06 DIAGNOSIS — Z9114 Patient's other noncompliance with medication regimen: Secondary | ICD-10-CM | POA: Diagnosis not present

## 2017-04-06 DIAGNOSIS — F209 Schizophrenia, unspecified: Secondary | ICD-10-CM | POA: Diagnosis not present

## 2017-04-06 DIAGNOSIS — Z960 Presence of urogenital implants: Secondary | ICD-10-CM | POA: Diagnosis not present

## 2017-04-06 DIAGNOSIS — Z8249 Family history of ischemic heart disease and other diseases of the circulatory system: Secondary | ICD-10-CM | POA: Diagnosis not present

## 2017-04-06 DIAGNOSIS — F203 Undifferentiated schizophrenia: Secondary | ICD-10-CM

## 2017-04-06 DIAGNOSIS — F17210 Nicotine dependence, cigarettes, uncomplicated: Secondary | ICD-10-CM | POA: Diagnosis not present

## 2017-04-06 DIAGNOSIS — N183 Chronic kidney disease, stage 3 (moderate): Secondary | ICD-10-CM

## 2017-04-06 DIAGNOSIS — Z79899 Other long term (current) drug therapy: Secondary | ICD-10-CM | POA: Diagnosis not present

## 2017-04-06 DIAGNOSIS — J449 Chronic obstructive pulmonary disease, unspecified: Secondary | ICD-10-CM | POA: Diagnosis not present

## 2017-04-06 DIAGNOSIS — I272 Pulmonary hypertension, unspecified: Secondary | ICD-10-CM | POA: Diagnosis not present

## 2017-04-06 LAB — RAPID URINE DRUG SCREEN, HOSP PERFORMED
AMPHETAMINES: NOT DETECTED
BENZODIAZEPINES: NOT DETECTED
Barbiturates: NOT DETECTED
Cocaine: NOT DETECTED
OPIATES: NOT DETECTED
TETRAHYDROCANNABINOL: NOT DETECTED

## 2017-04-06 LAB — TROPONIN I
Troponin I: 0.09 ng/mL (ref <0.03)
Troponin I: 0.11 ng/mL (ref <0.03)
Troponin I: 0.1 ng/mL (ref <0.03)

## 2017-04-06 MED ORDER — POTASSIUM CHLORIDE CRYS ER 20 MEQ PO TBCR
40.0000 meq | EXTENDED_RELEASE_TABLET | Freq: Once | ORAL | Status: AC
  Administered 2017-04-06: 40 meq via ORAL
  Filled 2017-04-06: qty 2

## 2017-04-06 MED ORDER — HALOPERIDOL DECANOATE 100 MG/ML IM SOLN: 100.0000 mg | INTRAMUSCULAR | Status: DC

## 2017-04-06 MED ORDER — NITROGLYCERIN 2 % TD OINT
1.0000 [in_us] | TOPICAL_OINTMENT | Freq: Four times a day (QID) | TRANSDERMAL | Status: DC
  Administered 2017-04-06 (×2): 1 [in_us] via TOPICAL
  Filled 2017-04-06 (×2): qty 1

## 2017-04-06 MED ORDER — ENOXAPARIN SODIUM 40 MG/0.4ML ~~LOC~~ SOLN
40.0000 mg | Freq: Every day | SUBCUTANEOUS | Status: DC
  Administered 2017-04-07: 40 mg via SUBCUTANEOUS
  Filled 2017-04-06 (×2): qty 0.4

## 2017-04-06 MED ORDER — FUROSEMIDE 10 MG/ML IJ SOLN
40.0000 mg | Freq: Two times a day (BID) | INTRAMUSCULAR | Status: DC
  Administered 2017-04-06: 40 mg via INTRAVENOUS
  Filled 2017-04-06: qty 4

## 2017-04-06 MED ORDER — INFLUENZA VAC SPLIT QUAD 0.5 ML IM SUSY: 0.5000 mL | PREFILLED_SYRINGE | INTRAMUSCULAR | Status: DC

## 2017-04-06 MED ORDER — ISOSORBIDE MONONITRATE ER 30 MG PO TB24
30.0000 mg | ORAL_TABLET | Freq: Every day | ORAL | Status: DC
  Administered 2017-04-06: 30 mg via ORAL
  Filled 2017-04-06: qty 1

## 2017-04-06 MED ORDER — SODIUM CHLORIDE 0.9 % IV SOLN: 250.0000 mL | INTRAVENOUS | Status: DC | PRN

## 2017-04-06 MED ORDER — HYDRALAZINE HCL 25 MG PO TABS
25.0000 mg | ORAL_TABLET | ORAL | Status: AC
  Administered 2017-04-06: 25 mg via ORAL
  Filled 2017-04-06: qty 1

## 2017-04-06 MED ORDER — ORAL CARE MOUTH RINSE
15.0000 mL | Freq: Two times a day (BID) | OROMUCOSAL | Status: DC
  Administered 2017-04-07 (×2): 15 mL via OROMUCOSAL

## 2017-04-06 MED ORDER — ALBUTEROL SULFATE (2.5 MG/3ML) 0.083% IN NEBU
3.0000 mL | INHALATION_SOLUTION | Freq: Four times a day (QID) | RESPIRATORY_TRACT | Status: DC | PRN
  Administered 2017-04-06: 3 mL via RESPIRATORY_TRACT
  Filled 2017-04-06: qty 3

## 2017-04-06 MED ORDER — SODIUM CHLORIDE 0.9% FLUSH: 3.0000 mL | INTRAVENOUS | Status: DC | PRN

## 2017-04-06 MED ORDER — ONDANSETRON HCL 4 MG/2ML IJ SOLN: 4.0000 mg | Freq: Four times a day (QID) | INTRAMUSCULAR | Status: DC | PRN

## 2017-04-06 MED ORDER — NICOTINE 21 MG/24HR TD PT24
21.0000 mg | MEDICATED_PATCH | Freq: Every day | TRANSDERMAL | Status: DC
  Administered 2017-04-06 – 2017-04-07 (×2): 21 mg via TRANSDERMAL
  Filled 2017-04-06 (×3): qty 1

## 2017-04-06 MED ORDER — ACETAMINOPHEN 325 MG PO TABS: 650.0000 mg | ORAL_TABLET | ORAL | Status: DC | PRN

## 2017-04-06 MED ORDER — HYDRALAZINE HCL 50 MG PO TABS
50.0000 mg | ORAL_TABLET | Freq: Three times a day (TID) | ORAL | Status: DC
  Administered 2017-04-06 – 2017-04-07 (×2): 50 mg via ORAL
  Filled 2017-04-06 (×2): qty 1

## 2017-04-06 MED ORDER — SPIRONOLACTONE 25 MG PO TABS
12.5000 mg | ORAL_TABLET | Freq: Every day | ORAL | Status: DC
  Administered 2017-04-06 – 2017-04-07 (×2): 12.5 mg via ORAL
  Filled 2017-04-06 (×2): qty 1

## 2017-04-06 MED ORDER — HYDRALAZINE HCL 25 MG PO TABS
25.0000 mg | ORAL_TABLET | Freq: Three times a day (TID) | ORAL | Status: DC
  Administered 2017-04-06: 25 mg via ORAL
  Filled 2017-04-06: qty 1

## 2017-04-06 MED ORDER — SODIUM CHLORIDE 0.9% FLUSH
3.0000 mL | Freq: Two times a day (BID) | INTRAVENOUS | Status: DC
  Administered 2017-04-06 – 2017-04-07 (×3): 3 mL via INTRAVENOUS

## 2017-04-06 MED ORDER — ASPIRIN 325 MG PO TABS
325.0000 mg | ORAL_TABLET | Freq: Every day | ORAL | Status: DC
  Administered 2017-04-06 – 2017-04-07 (×2): 325 mg via ORAL
  Filled 2017-04-06 (×2): qty 1

## 2017-04-06 MED ORDER — FUROSEMIDE 10 MG/ML IJ SOLN
80.0000 mg | Freq: Three times a day (TID) | INTRAMUSCULAR | Status: DC
  Administered 2017-04-06 – 2017-04-07 (×3): 80 mg via INTRAVENOUS
  Filled 2017-04-06 (×3): qty 8

## 2017-04-06 NOTE — Progress Notes (Addendum)
PROGRESS NOTE        PATIENT DETAILS Name: Gerald Nelson Age: 66 y.o. Sex: male Date of Birth: July 03, 1951 Admit Date: 04/05/2017 Admitting Physician No admitting provider for patient encounter. IWP:[email or web address], Denton Ar, MD  Brief Narrative: Patient is a 66 y.o. male history of chronic systolic heart failure-medication noncompliance-admitted with decompensated acute on chronic systolic heart failure. See below for further details.  Subjective: Sleeping this morning-but does acknowledge only taking "fluid pill"-apparent does not have the financial resources to take his other heart medications. Does acknowledge exertional dyspnea for the past few weeks.   Assessment/Plan: Acute on chronic systolic heart failure: Grossly volume overloaded-continue diuretics-have consulted CHF team. He will need more outpatient resources, will need case management consultation prior to discharge.  Pulmonary hypertension: Suspect related to profound LV dysfunction-cards planning RHC/LHC when more compensated  ? LV thrombus: Cards will review echo-but given history of noncompliance-not a good long-term candidate for anticoagulation.  Minimally elevated troponins: Likely demand ischemia-trend is flat and not consistent with ACS-continue supportive care, cards planning LHC when more compensated.  Chronic kidney disease stage III: Creatinine close to usual baseline-follow closely while on diuretics  Schizophrenia: Appears to be on Depo Haldol-per patient-he lives in a [place] and has follow-up with psychiatry.  Telemetry (independently reviewed): Sinus tach  Echo:EF 15% on TTE done on 01/04/17  Morning labs/Imaging ordered: yes  DVT Prophylaxis: Prophylactic Lovenox  Code Status: Full code   Family Communication: None at bedside  Disposition Plan: Remain inpatient  Antimicrobial agents: Anti-infectives    None      Procedures: None  CONSULTS:   cardiology  Time spent: 25- minutes-Greater than 50% of this time was spent in counseling, explanation of diagnosis, planning of further management, and coordination of care.  MEDICATIONS: Scheduled Meds: . aspirin  325 mg Oral Daily  . enoxaparin (LOVENOX) injection  40 mg Subcutaneous Daily  . furosemide  40 mg Intravenous BID  . nicotine  21 mg Transdermal Daily  . nitroGLYCERIN  1 inch Topical Q6H  . sodium chloride flush  3 mL Intravenous Q12H   Continuous Infusions: . sodium chloride     PRN Meds:.sodium chloride, acetaminophen, albuterol, ondansetron (ZOFRAN) IV, sodium chloride flush   PHYSICAL EXAM: Vital signs: Vitals:   04/06/17 0500 04/06/17 0530 04/06/17 0600 04/06/17 0727  BP: (!) 146/109 (!) 133/96 (!) 141/102 (!) 147/104  Pulse: 94 92 99 93  Resp: (!) 21 18 17 16   Temp:      TempSrc:      SpO2: 98% 98% 97% 97%  Weight:      Height:       Filed Weights   04/05/17 2137  Weight: 74.4 kg (164 lb)   Body mass index is 23.53 kg/m.   General appearance :Awake, alert, not in any distress.  Eyes:, pupils equally reactive to light and accomodation,no scleral icterus.Pink conjunctiva HEENT: Atraumatic and Normocephalic Neck: supple, + JVD. No cervical lymphadenopathy. No thyromegaly Resp:Good air entry bilaterally, no added sounds  CVS: S1 S2 regular  GI: Bowel sounds present, Non tender and not distended with no gaurding, rigidity or rebound.No organomegaly Extremities: B/L Lower Ext shows +edema, both legs are warm to touch Neurology:  speech clear,Non focal, sensation is grossly intact. Psychiatric: Normal judgment and insight. Alert and oriented x 3.  Musculoskeletal:No digital cyanosis Skin:No Rash, warm and dry Wounds:N/A  I have personally reviewed following labs and imaging studies  LABORATORY DATA: CBC:  Recent Labs Lab 04/05/17 2136  WBC 7.8  HGB 13.6  HCT 39.8  MCV 90.9  PLT 382    Basic Metabolic Panel:  Recent Labs Lab  04/05/17 2136  NA 132*  K 3.3*  CL 101  CO2 27  GLUCOSE 126*  BUN 18  CREATININE 1.87*  CALCIUM 9.0    GFR: Estimated Creatinine Clearance: 41.2 mL/min (A) (by C-G formula based on SCr of 1.87 mg/dL (H)).  Liver Function Tests: No results for input(s): AST, ALT, ALKPHOS, BILITOT, PROT, ALBUMIN in the last 168 hours. No results for input(s): LIPASE, AMYLASE in the last 168 hours. No results for input(s): AMMONIA in the last 168 hours.  Coagulation Profile: No results for input(s): INR, PROTIME in the last 168 hours.  Cardiac Enzymes:  Recent Labs Lab 04/06/17 0135 04/06/17 0622  TROPONINI 0.09* 0.11*    BNP (last 3 results) No results for input(s): PROBNP in the last 8760 hours.  HbA1C: No results for input(s): HGBA1C in the last 72 hours.  CBG: No results for input(s): GLUCAP in the last 168 hours.  Lipid Profile: No results for input(s): CHOL, HDL, LDLCALC, TRIG, CHOLHDL, LDLDIRECT in the last 72 hours.  Thyroid Function Tests: No results for input(s): TSH, T4TOTAL, FREET4, T3FREE, THYROIDAB in the last 72 hours.  Anemia Panel: No results for input(s): VITAMINB12, FOLATE, FERRITIN, TIBC, IRON, RETICCTPCT in the last 72 hours.  Urine analysis:    Component Value Date/Time   COLORURINE YELLOW 12/11/2013 1733   APPEARANCEUR CLEAR 12/11/2013 1733   LABSPEC 1.014 12/11/2013 1733   PHURINE 5.5 12/11/2013 1733   GLUCOSEU NEGATIVE 12/11/2013 1733   HGBUR NEGATIVE 12/11/2013 1733   BILIRUBINUR NEGATIVE 12/11/2013 1733   KETONESUR NEGATIVE 12/11/2013 1733   PROTEINUR NEGATIVE 12/11/2013 1733   UROBILINOGEN 1.0 12/11/2013 1733   NITRITE NEGATIVE 12/11/2013 1733   LEUKOCYTESUR NEGATIVE 12/11/2013 1733    Sepsis Labs: Lactic Acid, Venous    Component Value Date/Time   LATICACIDVEN 0.83 12/11/2013 2000    MICROBIOLOGY: No results found for this or any previous visit (from the past 240 hour(s)).  RADIOLOGY STUDIES/RESULTS: Dg Chest 2 View  Result  Date: 04/05/2017 CLINICAL DATA:  Shortness of breath EXAM: CHEST  2 VIEW COMPARISON:  01/26/2017 FINDINGS: Hyperinflation. Tiny bilateral pleural effusions. Moderate-to-marked cardiomegaly with globular cardiac configuration. Vascular congestion and mild diffuse interstitial opacity, suspicious for mild edema. No pneumothorax. Degenerative changes of the spine. IMPRESSION: Cardiomegaly with vascular congestion and mild diffuse interstitial edema. Tiny pleural effusions. Electronically Signed   By: Donavan Foil M.D.   On: 04/05/2017 22:12     LOS: 0 days   Oren Binet, MD  [facility] Pager:[phone]  If 7PM-7AM, please contact night-coverage [email or web address] Password [email or web address] 04/06/2017, 7:50 AM

## 2017-04-06 NOTE — ED Notes (Signed)
Pt ambulated around the department with this RN per request of pt

## 2017-04-06 NOTE — H&P (Signed)
History and Physical    Gerald Nelson NKN:[id number] DOB: Nov 30, 1950 DOA: 04/05/2017  PCP: [name], MD  Patient coming from: Home  I have personally briefly reviewed patient's old medical records in [place]  Chief Complaint: SOB  HPI: Gerald Nelson is a 66 y.o. male with medical history significant of CHF with EF 15%, HTN, Schizophrenia, CKD stage 3.  Patient presents to the ED for SOB.  Symptoms have been intermittently ongoing for past 6 months, worse over past 2 days.  Worse with exertion or laying flat.  No CP.  Has chronic cough, no fevers no chills.  No sick contacts.  Continues to smoke daily.  Doesn't follow up with cards (actually left AMA from hospitalist service x2 times in July this year).   ED Course: Trop 0.16 (was 0.10 last admit), no CP.  CXR shows pulm edema, BP 150/110.  Got lasix 40mg  IV.   Review of Systems: As per HPI otherwise 10 point review of systems negative.   Past Medical History:  Diagnosis Date  . Arthritis   . COPD (chronic obstructive pulmonary disease) ([place])   . Hernia, hiatal   . Hypertension   . Schizophrenia [facility])     Past Surgical History:  Procedure Laterality Date  . PENILE PROSTHESIS IMPLANT       reports that he has been smoking Cigarettes.  He has been smoking about 1.50 packs per day. He has never used smokeless tobacco. He reports that he does not drink alcohol or use drugs.  Allergies  Allergen Reactions  . Sulfa Antibiotics Nausea Only    Family History  Problem Relation Age of Onset  . Hypertension Mother   . Hypertension Father      Prior to Admission medications   Medication Sig Start Date End Date Taking? Authorizing Provider  albuterol (PROVENTIL HFA;VENTOLIN HFA) 108 (90 Base) MCG/ACT inhaler Inhale 1-2 puffs into the lungs every 6 (six) hours as needed for wheezing or shortness of breath.   Yes [provider]  aspirin 325 MG tablet Take 325 mg by mouth daily.   Yes [provider]  furosemide (LASIX) 40 MG tablet Take 40 mg by mouth daily.   Yes [provider]  haloperidol decanoate (HALDOL DECANOATE) 100 MG/ML injection Inject 100 mg into the muscle every 30 (thirty) days.  01/07/17  Yes [provider]    Physical Exam: Vitals:   04/05/17 2243 04/05/17 2315 04/05/17 2330 04/06/17 0030  BP: (!) 155/100 (!) 145/94 (!) 143/104 (!) 158/113  Pulse: (!) 104 92 90 (!) 103  Resp: 16 13 14 16   Temp: 98.1 F (36.7 C)     TempSrc: Oral     SpO2: 96% 94% 94% 95%  Weight:      Height:        Constitutional: NAD, calm, comfortable Eyes: PERRL, lids and conjunctivae normal ENMT: Mucous membranes are moist. Posterior pharynx clear of any exudate or lesions.Normal dentition.  Neck: normal, supple, no masses, no thyromegaly Respiratory: Rales at bases Cardiovascular: Regular rate and rhythm, no murmurs / rubs / gallops. No extremity edema. 2+ pedal pulses. No carotid bruits.  Abdomen: no tenderness, no masses palpated. No hepatosplenomegaly. Bowel sounds positive.  Musculoskeletal: no clubbing / cyanosis. No joint deformity upper and lower extremities. Good ROM, no contractures. Normal muscle tone.  Skin: no rashes, lesions, ulcers. No induration Neurologic: CN 2-12 grossly intact. Sensation intact, DTR normal. Strength 5/5 in all 4.  Psychiatric: Normal judgment and  insight. Alert and oriented x 3. Normal mood.    Labs on Admission: I have personally reviewed following labs and imaging studies  CBC:  Recent Labs Lab 04/05/17 2136  WBC 7.8  HGB 13.6  HCT 39.8  MCV 90.9  PLT 161   Basic Metabolic Panel:  Recent Labs Lab 04/05/17 2136  NA 132*  K 3.3*  CL 101  CO2 27  GLUCOSE 126*  BUN 18  CREATININE 1.87*  CALCIUM 9.0   GFR: Estimated Creatinine Clearance: 41.2 mL/min (A) (by C-G formula based on SCr of 1.87 mg/dL (H)). Liver Function Tests: No results for input(s): AST, ALT, ALKPHOS, BILITOT, PROT, ALBUMIN in the last 168  hours. No results for input(s): LIPASE, AMYLASE in the last 168 hours. No results for input(s): AMMONIA in the last 168 hours. Coagulation Profile: No results for input(s): INR, PROTIME in the last 168 hours. Cardiac Enzymes: No results for input(s): CKTOTAL, CKMB, CKMBINDEX, TROPONINI in the last 168 hours. BNP (last 3 results) No results for input(s): PROBNP in the last 8760 hours. HbA1C: No results for input(s): HGBA1C in the last 72 hours. CBG: No results for input(s): GLUCAP in the last 168 hours. Lipid Profile: No results for input(s): CHOL, HDL, LDLCALC, TRIG, CHOLHDL, LDLDIRECT in the last 72 hours. Thyroid Function Tests: No results for input(s): TSH, T4TOTAL, FREET4, T3FREE, THYROIDAB in the last 72 hours. Anemia Panel: No results for input(s): VITAMINB12, FOLATE, FERRITIN, TIBC, IRON, RETICCTPCT in the last 72 hours. Urine analysis:    Component Value Date/Time   COLORURINE YELLOW 12/11/2013 [place] 12/11/2013 1733   LABSPEC 1.014 12/11/2013 1733   PHURINE 5.5 12/11/2013 1733   GLUCOSEU NEGATIVE 12/11/2013 1733   HGBUR NEGATIVE 12/11/2013 1733   BILIRUBINUR NEGATIVE 12/11/2013 1733   KETONESUR NEGATIVE 12/11/2013 1733   PROTEINUR NEGATIVE 12/11/2013 1733   UROBILINOGEN 1.0 12/11/2013 1733   NITRITE NEGATIVE 12/11/2013 1733   LEUKOCYTESUR NEGATIVE 12/11/2013 1733    Radiological Exams on Admission: Dg Chest 2 View  Result Date: 04/05/2017 CLINICAL DATA:  Shortness of breath EXAM: CHEST  2 VIEW COMPARISON:  01/26/2017 FINDINGS: Hyperinflation. Tiny bilateral pleural effusions. Moderate-to-marked cardiomegaly with globular cardiac configuration. Vascular congestion and mild diffuse interstitial opacity, suspicious for mild edema. No pneumothorax. Degenerative changes of the spine. IMPRESSION: Cardiomegaly with vascular congestion and mild diffuse interstitial edema. Tiny pleural effusions. Electronically Signed   By: Donavan Foil M.D.   On: 04/05/2017  22:12    EKG: Independently reviewed.  Assessment/Plan Principal Problem:   Acute on chronic systolic CHF (congestive heart failure) (HCC) Active Problems:   Schizophrenia (HCC)   CKD (chronic kidney disease), stage III (HCC)   Essential hypertension    1. Acute on chronic systolic CHF - 1. CHF pathway 2. Daily BMP 3. Intake and output 4. Lasix 40mg  IV BID 5. Given elevated BP will put on NTG paste as well 6. Continue ASA 325 7. Serial trops, but EDP spoke with cards and they doubt acute plaque wall rupture MI.  Trop was 0.10 last admit in July also for CHF. 8. EF 15% - but until patient goes to follow up appointments, I am doubtful cardiology would consider AICD / pacer placement 9. EKG shows numerous TWI and ST depressions, esp in lateral leads, these appear present on the July 25th EKGs as well. 2. HTN - 1. NTG paste and lasix for now 3. Schizophrenia - continue halidol 4. CKD stage 3 - chronic and baseline, monitor with daily BMP  DVT prophylaxis: Lovenox Code Status: Full Family Communication: No family in room Disposition Plan: Home after admit Consults called: None, EDP did speak with cards Admission status: Place in 33   Sitlali Koerner, [facility] Pager [phone]  If 7AM-7PM, please contact day team taking care of patient [email or web address] Password [email or web address]  04/06/2017, 12:46 AM

## 2017-04-06 NOTE — Consult Note (Signed)
Advanced Heart Failure Team History and Physical Note   Primary Physician:  Dr Deforest Hoyles  Primary Cardiologist:  Dr Harrington Challenger   Reason for Admission: A/C Systolic Heart Failure     HPI:    Gerald Nelson is a 66 year old with a history of chronci systolic heart failure, pulmonary htn on echo, apical thrombus, smoker, CVA, schizophrenia, CKD Stage III.   Last 2 admits he left AMA. He has not had an ischemia work up. Prior to admit he was taking lasix 40 mg daily. Sleeps in a chair due to dyspnea. Smokes 2PPD. Says he has trouble getting medications and food. Lives in a boarding house.   Presented to [facility] with increased dyspnea. CXR with pulmonary edema. Pertinent labs: BNP 2000, creatinine 1.8,  K 3.3, and troponin .011. He has received a total of 80 mg IV lasix.   SOB at rest. Denies CP.    ECHO 01/2017  LVEF 15%, severe global hypokinesis, moderate LVH, coarse   trabeculation of the LV apex with numerous false tendinae of the   left ventricle, very stagnant blood flow at the LV apex with   smoke but no obvious LV thrombus - Definity contrast was given,   again, noting stagnant apical blood flow - this could suggest   recent thrombus and certainly high risk for apical thrombus   formation. Other findings include aortic sclerosis with mild AI,   moderate to severe Gerald, moderate LAE, mild RAE, moderate to severe   TR, moderate to severe pulmonary hypertension (RVSP 73 mmHg),   dilated IVC, trivial posterior pericardial effusion - tamponade   physiology cannot be excluded given the degree of pulmonary   hypertension.    Review of Systems: [y] = yes, [ ]  = no   General: Weight gain [ ] ; Weight loss [ ] ; Anorexia [ ] ; Fatigue [Y ]; Fever [ ] ; Chills [ ] ; Weakness [ ]   Cardiac: Chest pain/pressure [ ] ; Resting SOB [Y ]; Exertional SOB [ ] ; Orthopnea [Y ]; Pedal Edema [ ] ; Palpitations [ ] ; Syncope [ ] ; Presyncope [ ] ; Paroxysmal nocturnal dyspnea[ ]   Pulmonary: Cough [ ] ; Wheezing[ ] ;  Hemoptysis[ ] ; Sputum [ ] ; Snoring [ ]   GI: Vomiting[ ] ; Dysphagia[ ] ; Melena[ ] ; Hematochezia [ ] ; Heartburn[ ] ; Abdominal pain [ ] ; Constipation [ ] ; Diarrhea [ ] ; BRBPR [ ]   GU: Hematuria[ ] ; Dysuria [ ] ; Nocturia[ ]   Vascular: Pain in legs with walking [ ] ; Pain in feet with lying flat [ ] ; Non-healing sores [ ] ; Stroke [Y ]; TIA [ ] ; Slurred speech [ ] ;  Neuro: Headaches[ ] ; Vertigo[ ] ; Seizures[ ] ; Paresthesias[ ] ;Blurred vision [ ] ; Diplopia [ ] ; Vision changes [ ]   Ortho/Skin: Arthritis [ ] ; Joint pain [Y ]; Muscle pain [ ] ; Joint swelling [ ] ; Back Pain [ ] ; Rash [ ]   Psych: Depression[ ] ; Anxiety[ ]   Heme: Bleeding problems [ ] ; Clotting disorders [ ] ; Anemia [ ]   Endocrine: Diabetes [ ] ; Thyroid dysfunction[ ]    Home Medications Prior to Admission medications   Medication Sig Start Date End Date Taking? Authorizing Provider  albuterol (PROVENTIL HFA;VENTOLIN HFA) 108 (90 Base) MCG/ACT inhaler Inhale 1-2 puffs into the lungs every 6 (six) hours as needed for wheezing or shortness of breath.   Yes [provider]  aspirin 325 MG tablet Take 325 mg by mouth daily.   Yes [provider]  furosemide (LASIX) 40 MG tablet Take 40 mg by mouth daily.  Yes [provider]  haloperidol decanoate (HALDOL DECANOATE) 100 MG/ML injection Inject 100 mg into the muscle every 30 (thirty) days.  01/07/17  Yes [provider]    Past Medical History: Past Medical History:  Diagnosis Date  . Arthritis   . COPD (chronic obstructive pulmonary disease) ([place])   . Hernia, hiatal   . Hypertension   . Schizophrenia [facility])     Past Surgical History: Past Surgical History:  Procedure Laterality Date  . PENILE PROSTHESIS IMPLANT      Family History:  Family History  Problem Relation Age of Onset  . Hypertension Mother   . Hypertension Father     Social History: Social History   Social History  . Marital status: Single    Spouse name: N/A  . Number of  children: N/A  . Years of education: N/A   Social History Main Topics  . Smoking status: Current Every Day Smoker    Packs/day: 1.50    Types: Cigarettes  . Smokeless tobacco: Never Used  . Alcohol use No  . Drug use: No  . Sexual activity: Not Asked   Other Topics Concern  . None   Social History Narrative  . None    Allergies:  Allergies  Allergen Reactions  . Sulfa Antibiotics Nausea Only    Objective:    Vital Signs:   Temp:  [98.1 F (36.7 C)-98.5 F (36.9 C)] 98.5 F (36.9 C) (10/03 0440) Pulse Rate:  [89-139] 91 (10/03 0830) Resp:  [13-27] 25 (10/03 0830) BP: (133-169)/(94-137) 149/111 (10/03 0830) SpO2:  [94 %-100 %] 98 % (10/03 0830) Weight:  [164 lb (74.4 kg)] 164 lb (74.4 kg) (10/02 2137)   Filed Weights   04/05/17 2137  Weight: 164 lb (74.4 kg)     Physical Exam     General:  Elderly. Dyspneic Talking. HEENT: Normal Neck: Supple. JVP to jaw. Carotids 2+ bilat; no bruits. No lymphadenopathy or thyromegaly appreciated. Cor: PMI nondisplaced. Regular rate & rhythm. No rubs, or murmurs. + S3  Lungs: Crackles in bases on 2 liters.  Abdomen: Soft, nontender, nondistended. No hepatosplenomegaly. No bruits or masses. Good bowel sounds. Extremities: No cyanosis, clubbing, rash, edema Neuro: Alert & oriented x 3, cranial nerves grossly intact. moves all 4 extremities w/o difficulty. Affect pleasant.   Telemetry   Sinus Tach 100s.  EKG   Sinus Tach 102 bpm.   Labs     Basic Metabolic Panel:  Recent Labs Lab 04/05/17 2136  NA 132*  K 3.3*  CL 101  CO2 27  GLUCOSE 126*  BUN 18  CREATININE 1.87*  CALCIUM 9.0    Liver Function Tests: No results for input(s): AST, ALT, ALKPHOS, BILITOT, PROT, ALBUMIN in the last 168 hours. No results for input(s): LIPASE, AMYLASE in the last 168 hours. No results for input(s): AMMONIA in the last 168 hours.  CBC:  Recent Labs Lab 04/05/17 2136  WBC 7.8  HGB 13.6  HCT 39.8  MCV 90.9  PLT 204     Cardiac Enzymes:  Recent Labs Lab 04/06/17 0135 04/06/17 0622  TROPONINI 0.09* 0.11*    BNP: BNP (last 3 results)  Recent Labs  01/03/17 2222 01/26/17 2152 04/05/17 2136  BNP 1,681.6* 3,641.0* 2,689.3*    ProBNP (last 3 results) No results for input(s): PROBNP in the last 8760 hours.   CBG: No results for input(s): GLUCAP in the last 168 hours.  Coagulation Studies: No results for input(s): LABPROT, INR in the last 72 hours.  Imaging: Dg Chest 2 View  Result Date: 04/05/2017 CLINICAL DATA:  Shortness of breath EXAM: CHEST  2 VIEW COMPARISON:  01/26/2017 FINDINGS: Hyperinflation. Tiny bilateral pleural effusions. Moderate-to-marked cardiomegaly with globular cardiac configuration. Vascular congestion and mild diffuse interstitial opacity, suspicious for mild edema. No pneumothorax. Degenerative changes of the spine. IMPRESSION: Cardiomegaly with vascular congestion and mild diffuse interstitial edema. Tiny pleural effusions. Electronically Signed   By: Donavan Foil M.D.   On: 04/05/2017 22:12      Patient Profile  Gerald Nelson is a 66 year old with a history of chronci systolic heart failure, pulmonary htn on echo, apical thrombus, smoker, CVA, schizophrenia, and CKD Stage III.   Admitted with A/C systolic heart failure.     Assessment/Plan   1. A/C Systolic Heart Failure - ECHO 01/2017 15%. He has not had ischemic work up because he has signed out AMA the last 2 admits. NYHA IV. Volume overloaded. Increase lasix to 80 mg IV three times a day.  Start hydralazine 25 mg three times a day. Start imdur 30 mg daily.  No arb with CKD No dig or spiro with CKD and noncompliance.  Hold off on inotropes and try iv lasix. May need to add milrinone to augment diuresis. He is not candidate for home inotropes.  Hold off on PICC because I am concerned he will sign out AMA.  2. CKD Stage III- creatinine baseline 1.6- 1.8. Follow up renal function closely.  3. Smoker- 2PPD.  Discussed smoking cessation.  4. Pulmonary HTN - noted on ECHO. Pursue RHC after diuresed.  5. Moderate -Severe Gerald/TR on ECHO 01/2017  6. DNI but wants CPR and medications.  7.Noncompliance   He is not a candidate for advanced therapies due to noncompliance and social barriers. He will need RHC/LHC after diuresed if he will agree.   He will be difficult manage given poor insight.   Darrick Grinder, NP 04/06/2017, 10:04 AM  Advanced Heart Failure Team Pager [phone] (M-F; [place])  Please contact [facility] for night-coverage after hours (4p -7a ) and weekends on [email or web address]  Patient seen with NP, agree with the above note.  He is hypertensive with marked volume overload.  Poor compliance, schizophrenic, lives in a boarding house.  Only taking Lasix 40 mg daily at home.  Smokes 2 ppd.  No chest pain.  - Will diurese with Lasix 80 mg IV every 8 hrs.  - Start spironolactone 12.5 daily.  - Start hydralazine/Imdur and titrate up as BP tolerates.  - No ACEI/ARB/ARNI for now with elevated creatinine.   - He is not a candidate for advanced therapies.  Will hold off on PICC line for now, milrinone would be option if volume is difficult to manage but not home milrinone.  - Should have ischemic workup => will diurese then plan RHC/LHC.   Will review echo for ?thrombus, he would be unlikely to comply with coumadin however.   Creatinine 1.8, baseline around 1.6.  May improve with diuresis.   Loralie Champagne 04/06/2017 12:01 PM

## 2017-04-06 NOTE — ED Notes (Signed)
Breakfast tray given. °

## 2017-04-06 NOTE — Progress Notes (Signed)
Repositioned patient.

## 2017-04-06 NOTE — ED Notes (Signed)
Hospitalist at bedside 

## 2017-04-07 DIAGNOSIS — I130 Hypertensive heart and chronic kidney disease with heart failure and stage 1 through stage 4 chronic kidney disease, or unspecified chronic kidney disease: Secondary | ICD-10-CM | POA: Diagnosis not present

## 2017-04-07 DIAGNOSIS — I248 Other forms of acute ischemic heart disease: Secondary | ICD-10-CM | POA: Diagnosis not present

## 2017-04-07 DIAGNOSIS — I5023 Acute on chronic systolic (congestive) heart failure: Secondary | ICD-10-CM | POA: Diagnosis not present

## 2017-04-07 DIAGNOSIS — F203 Undifferentiated schizophrenia: Secondary | ICD-10-CM | POA: Diagnosis not present

## 2017-04-07 DIAGNOSIS — N183 Chronic kidney disease, stage 3 (moderate): Secondary | ICD-10-CM | POA: Diagnosis not present

## 2017-04-07 LAB — BASIC METABOLIC PANEL
Anion gap: 7 (ref 5–15)
Anion gap: 7 (ref 5–15)
BUN: 19 mg/dL (ref 6–20)
BUN: 18 mg/dL (ref 6–20)
CALCIUM: 8.4 mg/dL — AB (ref 8.9–10.3)
CALCIUM: 8.5 mg/dL — AB (ref 8.9–10.3)
CO2: 31 mmol/L (ref 22–32)
CO2: 32 mmol/L (ref 22–32)
CREATININE: 1.53 mg/dL — AB (ref 0.61–1.24)
CREATININE: 1.62 mg/dL — AB (ref 0.61–1.24)
Chloride: 101 mmol/L (ref 101–111)
Chloride: 100 mmol/L — ABNORMAL LOW (ref 101–111)
GFR calc Af Amer: 54 mL/min — ABNORMAL LOW (ref >60)
GFR calc non Af Amer: 43 mL/min — ABNORMAL LOW (ref >60)
GFR, EST AFRICAN AMERICAN: 50 mL/min — AB (ref >60)
GFR, EST NON AFRICAN AMERICAN: 46 mL/min — AB (ref >60)
GLUCOSE: 114 mg/dL — AB (ref 65–99)
Glucose, Bld: 136 mg/dL — ABNORMAL HIGH (ref 65–99)
POTASSIUM: 3.7 mmol/L (ref 3.5–5.1)
Potassium: 3.7 mmol/L (ref 3.5–5.1)
SODIUM: 139 mmol/L (ref 135–145)
Sodium: 139 mmol/L (ref 135–145)

## 2017-04-07 LAB — MAGNESIUM: MAGNESIUM: 1.6 mg/dL — AB (ref 1.7–2.4)

## 2017-04-07 LAB — MRSA PCR SCREENING: MRSA by PCR: NEGATIVE

## 2017-04-07 MED ORDER — ISOSORBIDE MONONITRATE ER 60 MG PO TB24
60.0000 mg | ORAL_TABLET | Freq: Every day | ORAL | Status: DC
  Administered 2017-04-07: 60 mg via ORAL
  Filled 2017-04-07: qty 1

## 2017-04-07 MED ORDER — ALBUTEROL SULFATE (2.5 MG/3ML) 0.083% IN NEBU: 3.0000 mL | INHALATION_SOLUTION | RESPIRATORY_TRACT | Status: DC | PRN

## 2017-04-07 MED ORDER — SPIRONOLACTONE 25 MG PO TABS: 25.0000 mg | ORAL_TABLET | Freq: Every day | ORAL | Status: DC

## 2017-04-07 MED ORDER — MAGNESIUM SULFATE 2 GM/50ML IV SOLN
2.0000 g | Freq: Once | INTRAVENOUS | Status: DC
  Filled 2017-04-07: qty 50

## 2017-04-07 MED ORDER — IPRATROPIUM-ALBUTEROL 0.5-2.5 (3) MG/3ML IN SOLN: 3.0000 mL | Freq: Three times a day (TID) | RESPIRATORY_TRACT | Status: DC

## 2017-04-07 MED ORDER — POTASSIUM CHLORIDE CRYS ER 20 MEQ PO TBCR
40.0000 meq | EXTENDED_RELEASE_TABLET | Freq: Once | ORAL | Status: AC
  Administered 2017-04-07: 40 meq via ORAL
  Filled 2017-04-07: qty 2

## 2017-04-07 NOTE — Progress Notes (Signed)
Patient has signed AMA form and is aware of the medical risks and possibility of death. IV and telemetry removed. Patient walked off of the floor.

## 2017-04-07 NOTE — Discharge Summary (Signed)
PATIENT DETAILS Name: Gerald Nelson Age: 66 y.o. Sex: male Date of Birth: 30-Sep-1950 MRN: [id number]. Admitting Physician: Etta Quill, DO XTK:[id number], D[place] Ar, MD  Admit Date: 04/05/2017 Discharge date: 04/07/2017    Note-signed out AMA  Recommendations for Outpatient Follow-up:  1. Follow up with PCP in 1-2 weeks 2. Please obtain BMP/CBC in one week   Admitted From:  Home  Disposition: Left AMA    Home Health: No  Equipment/Devices:  None  Discharge Condition: Guarded-left AMA  CODE STATUS: FULL CODE  Diet recommendation:  Heart Healthy  Brief Summary: Patient is a 66 y.o. male history of chronic systolic heart failure-medication noncompliance-admitted with decompensated acute on chronic systolic heart failure. See below for further details.  Brief Hospital Course: Acute on chronic systolic heart failure (EF 15% on TTE done on 01/04/17): Still volume overloaded-was diuresing well-but unfortunately signe out AMA.  Pulmonary hypertension: Likely related to profound LV dysfunction, cards was planning on LHC/RHC when he is more compensated and if patient is willing to proceed.   ? LV thrombus: Not sure if he actually has a LV thrombus on his prior echo-in any event-he is noncompliant to medications and follow-up and probably not a good long-term candidate for anticoagulation  Minimally elevated troponins: Likely demand ischemia-trend is flat and not consistent with ACS-continue supportive care, cards was planning LHC when more compensated.  Chronic kidney disease stage III: Creatinine is close to  usual baseline, follow closely.  Schizophrenia: Continue Depo Haldol-apparently he lives in a boarding house-he apparently has patient follow up with psychiatry.   Procedures/Studies: None  Discharge Diagnoses:  Principal Problem:   Acute on chronic systolic CHF (congestive heart failure) (HCC) Active Problems:   Schizophrenia (HCC)   CKD (chronic kidney  disease), stage III (HCC)   Essential hypertension         Allergies  Allergen Reactions  . Sulfa Antibiotics Nausea Only     Consultations:  Cardiology   Other Procedures/Studies: Dg Chest 2 View  Result Date: 04/05/2017 CLINICAL DATA:  Shortness of breath EXAM: CHEST  2 VIEW COMPARISON:  01/26/2017 FINDINGS: Hyperinflation. Tiny bilateral pleural effusions. Moderate-to-marked cardiomegaly with globular cardiac configuration. Vascular congestion and mild diffuse interstitial opacity, suspicious for mild edema. No pneumothorax. Degenerative changes of the spine. IMPRESSION: Cardiomegaly with vascular congestion and mild diffuse interstitial edema. Tiny pleural effusions. Electronically Signed   By: Donavan Foil M.D.   On: 04/05/2017 22:12      TODAY-DAY OF DISCHARGE:  Subjective:   Gerald Nelson insisted on leaving the hospital-depsite being clearly told that he was still not stable for discharge  Objective:   Blood pressure (!) 153/98, pulse 88, temperature 98 F (36.7 C), temperature source Oral, resp. rate 18, height 5\' 10"  (1.778 m), weight 65.4 kg (144 lb 1.6 oz), SpO2 99 %.  Intake/Output Summary (Last 24 hours) at 04/07/17 1131 Last data filed at 04/07/17 1042  Gross per 24 hour  Intake              720 ml  Output             5875 ml  Net            -5155 ml   Filed Weights   04/05/17 2137 04/06/17 1828 04/07/17 0559  Weight: 74.4 kg (164 lb) 67.5 kg (148 lb 13 oz) 65.4 kg (144 lb 1.6 oz)    Exam: Eyes:, pupils equally reactive to light and accomodation,no scleral icterus. HEENT: Atraumatic and Normocephalic  Neck: supple, Resp: Moving air-scattered rhonchi all over CVS: S1 S2 regular, no murmurs.  GI: Bowel sounds present, Non tender and not distended with no gaurding, rigidity or rebound. Extremities: B/L Lower Ext shows no edema, both legs are warm to touch Neurology:  speech clear,Non focal, sensation is grossly intact-but with generalized  weakness. Musculoskeletal:No digital cyanosis Skin:No Rash, warm and dry Wounds:N/A   PERTINENT RADIOLOGIC STUDIES: Dg Chest 2 View  Result Date: 04/05/2017 CLINICAL DATA:  Shortness of breath EXAM: CHEST  2 VIEW COMPARISON:  01/26/2017 FINDINGS: Hyperinflation. Tiny bilateral pleural effusions. Moderate-to-marked cardiomegaly with globular cardiac configuration. Vascular congestion and mild diffuse interstitial opacity, suspicious for mild edema. No pneumothorax. Degenerative changes of the spine. IMPRESSION: Cardiomegaly with vascular congestion and mild diffuse interstitial edema. Tiny pleural effusions. Electronically Signed   By: Donavan Foil M.D.   On: 04/05/2017 22:12     PERTINENT LAB RESULTS: CBC:  Recent Labs  04/05/17 2136  WBC 7.8  HGB 13.6  HCT 39.8  PLT 204   CMET CMP     Component Value Date/Time   NA 139 04/07/2017 0816   K 3.7 04/07/2017 0816   CL 100 (L) 04/07/2017 0816   CO2 32 04/07/2017 0816   GLUCOSE 114 (H) 04/07/2017 0816   BUN 18 04/07/2017 0816   CREATININE 1.62 (H) 04/07/2017 0816   CALCIUM 8.5 (L) 04/07/2017 0816   PROT 6.0 (L) 01/27/2017 0744   ALBUMIN 3.0 (L) 01/27/2017 0744   AST 43 (H) 01/27/2017 0744   ALT 27 01/27/2017 0744   ALKPHOS 68 01/27/2017 0744   BILITOT 1.8 (H) 01/27/2017 0744   GFRNONAA 43 (L) 04/07/2017 0816   GFRAA 50 (L) 04/07/2017 0816    GFR Estimated Creatinine Clearance: 42.6 mL/min (A) (by C-G formula based on SCr of 1.62 mg/dL (H)). No results for input(s): LIPASE, AMYLASE in the last 72 hours.  Recent Labs  04/06/17 0135 04/06/17 0622 04/06/17 1500  TROPONINI 0.09* 0.11* 0.10*   Invalid input(s): POCBNP No results for input(s): DDIMER in the last 72 hours. No results for input(s): HGBA1C in the last 72 hours. No results for input(s): CHOL, HDL, LDLCALC, TRIG, CHOLHDL, LDLDIRECT in the last 72 hours. No results for input(s): TSH, T4TOTAL, T3FREE, THYROIDAB in the last 72 hours.  Invalid input(s):  FREET3 No results for input(s): VITAMINB12, FOLATE, FERRITIN, TIBC, IRON, RETICCTPCT in the last 72 hours. Coags: No results for input(s): INR in the last 72 hours.  Invalid input(s): PT Microbiology: Recent Results (from the past 240 hour(s))  MRSA PCR Screening     Status: None   Collection Time: 04/06/17  9:25 PM  Result Value Ref Range Status   MRSA by PCR NEGATIVE NEGATIVE Final    Comment:        The GeneXpert MRSA Assay (FDA approved for NASAL specimens only), is one component of a comprehensive MRSA colonization surveillance program. It is not intended to diagnose MRSA infection nor to guide or monitor treatment for MRSA infections.     FURTHER DISCHARGE INSTRUCTIONS:  Get Medicines reviewed and adjusted: Please take all your medications with you for your next visit with your Primary MD  Laboratory/radiological data: Please request your Primary MD to go over all hospital tests and procedure/radiological results at the follow up, please ask your Primary MD to get all Hospital records sent to his/her office.  In some cases, they will be blood work, cultures and biopsy results pending at the time of your discharge. Please request that  your primary care M.D. goes through all the records of your hospital data and follows up on these results.  Also Note the following: If you experience worsening of your admission symptoms, develop shortness of breath, life threatening emergency, suicidal or homicidal thoughts you must seek medical attention immediately by calling 911 or calling your MD immediately  if symptoms less severe.  You must read complete instructions/literature along with all the possible adverse reactions/side effects for all the Medicines you take and that have been prescribed to you. Take any new Medicines after you have completely understood and accpet all the possible adverse reactions/side effects.   Do not drive when taking Pain medications or sleeping  medications (Benzodaizepines)  Do not take more than prescribed Pain, Sleep and Anxiety Medications. It is not advisable to combine anxiety,sleep and pain medications without talking with your primary care practitioner  Special Instructions: If you have smoked or chewed Tobacco  in the last 2 yrs please stop smoking, stop any regular Alcohol  and or any Recreational drug use.  Wear Seat belts while driving.  Please note: You were cared for by a hospitalist during your hospital stay. Once you are discharged, your primary care physician will handle any further medical issues. Please note that NO REFILLS for any discharge medications will be authorized once you are discharged, as it is imperative that you return to your primary care physician (or establish a relationship with a primary care physician if you do not have one) for your post hospital discharge needs so that they can reassess your need for medications and monitor your lab values.  Total Time spent coordinating discharge including counseling, education and face to face time equals 25 minutes.  SignedOren Binet 04/07/2017 11:31 AM

## 2017-04-07 NOTE — Progress Notes (Signed)
Informed by RN-the patient has decided to leave [place]. I met the patient in the hallway, explained that he needs to remain hospitalized-he is still short of breath with exertion-but he is adamant on leaving. I explained significant life-threatening and life disabling risks, he is accepting all these risks and wants to leave [place].                                                                                                                                             AMA  Patient at this time expresses desire to leave the Hospital immidiately, patient has been warned that this is not Medically advisable at this time, and can result in Medical complications like Death and Disability, patient understands and accepts the risks involved and assumes full responsibilty of this decision.   Oren Binet M.D on 04/07/2017 at 11:30 AM  [facility]

## 2017-04-07 NOTE — Progress Notes (Signed)
PROGRESS NOTE        PATIENT DETAILS Name: Gerald Nelson Age: 66 y.o. Sex: male Date of Birth: 10-11-1950 Admit Date: 04/05/2017 Admitting Physician Etta Quill, DO YPP:[id number], Denton Ar, MD  Brief Narrative: Patient is a 66 y.o. male history of chronic systolic heart failure-medication noncompliance-admitted with decompensated acute on chronic systolic heart failure. See below for further details.  Subjective: Wheezing this morning-he wants to leave the unit and go outside for "fresh air".  He feels better than yesterday  Assessment/Plan: Acute on chronic systolic heart failure (EF 15% on TTE done on 01/04/17): Still volume overloaded-wheezing this morning-continue diuretics at the discretion of cardiology/CHF team. Diuresing well, -5.9 L so far. Have spoken with case management to see if we can optimize maximum outpatient resources.  Pulmonary hypertension: Likely related to profound LV dysfunction, cards planning on LHC/RHC when he is more compensated and if patient is willing to proceed. Diuretics per cardiology.  ? LV thrombus: Not sure if he actually has a LV thrombus on his prior echo-in any event-he is noncompliant to medications and follow-up and probably not a good long-term candidate for anticoagulation  Minimally elevated troponins: Likely demand ischemia-trend is flat and not consistent with ACS-continue supportive care, cards planning LHC when more compensated.  Chronic kidney disease stage III: Creatinine is close to  usual baseline, follow closely.  Schizophrenia: Continue Depo Haldol-apparently he lives in a boarding house-he apparently has patient follow up with psychiatry.   Telemetry (independently reviewed): SR  Echo:EF 15% on TTE done on 01/04/17  Morning labs/Imaging ordered: yes  DVT Prophylaxis: Prophylactic Lovenox  Code Status: Full code   Family Communication: None at bedside  Disposition Plan: Remain  inpatient  Antimicrobial agents: Anti-infectives    None      Procedures: None  CONSULTS:  cardiology  Time spent: 25- minutes-Greater than 50% of this time was spent in counseling, explanation of diagnosis, planning of further management, and coordination of care.  MEDICATIONS: Scheduled Meds: . aspirin  325 mg Oral Daily  . enoxaparin (LOVENOX) injection  40 mg Subcutaneous Daily  . furosemide  80 mg Intravenous TID  . hydrALAZINE  50 mg Oral Q8H  . Influenza vac split quadrivalent PF  0.5 mL Intramuscular Tomorrow-1000  . isosorbide mononitrate  60 mg Oral Daily  . mouth rinse  15 mL Mouth Rinse BID  . nicotine  21 mg Transdermal Daily  . sodium chloride flush  3 mL Intravenous Q12H  . [START ON 04/08/2017] spironolactone  25 mg Oral Daily   Continuous Infusions: . sodium chloride    . magnesium sulfate 1 - 4 g bolus IVPB     PRN Meds:.sodium chloride, acetaminophen, albuterol, ondansetron (ZOFRAN) IV, sodium chloride flush   PHYSICAL EXAM: Vital signs: Vitals:   04/06/17 2353 04/07/17 0559 04/07/17 0610 04/07/17 0808  BP: (!) 124/91 130/81  (!) 153/98  Pulse: 93 81  88  Resp: (!) 22 20  18   Temp: 97.9 F (36.6 C) 98.3 F (36.8 C)  98 F (36.7 C)  TempSrc: Oral Oral  Oral  SpO2: 98% 93% 94% 99%  Weight:  65.4 kg (144 lb 1.6 oz)    Height:       Filed Weights   04/05/17 2137 04/06/17 1828 04/07/17 0559  Weight: 74.4 kg (164 lb) 67.5 kg (148 lb 13 oz) 65.4 kg (144  lb 1.6 oz)   Body mass index is 20.68 kg/m.   General appearance :Awake, alert, not in any distress.  Eyes:, pupils equally reactive to light and accomodation,no scleral icterus. HEENT: Atraumatic and Normocephalic Neck: supple, no JVD. Resp: Moving air-scattered rhonchi all over CVS: S1 S2 regular, no murmurs.  GI: Bowel sounds present, Non tender and not distended with no gaurding, rigidity or rebound. Extremities: B/L Lower Ext shows no edema, both legs are warm to touch Neurology:   speech clear,Non focal, sensation is grossly intact-but with generalized weakness. Musculoskeletal:No digital cyanosis Skin:No Rash, warm and dry Wounds:N/A  I have personally reviewed following labs and imaging studies  LABORATORY DATA: CBC:  Recent Labs Lab 04/05/17 2136  WBC 7.8  HGB 13.6  HCT 39.8  MCV 90.9  PLT 354    Basic Metabolic Panel:  Recent Labs Lab 04/05/17 2136 04/07/17 0714 04/07/17 0816  NA 132* 139 139  K 3.3* 3.7 3.7  CL 101 101 100*  CO2 27 31 32  GLUCOSE 126* 136* 114*  BUN 18 19 18   CREATININE 1.87* 1.53* 1.62*  CALCIUM 9.0 8.4* 8.5*  MG  --  1.6*  --     GFR: Estimated Creatinine Clearance: 42.6 mL/min (A) (by C-G formula based on SCr of 1.62 mg/dL (H)).  Liver Function Tests: No results for input(s): AST, ALT, ALKPHOS, BILITOT, PROT, ALBUMIN in the last 168 hours. No results for input(s): LIPASE, AMYLASE in the last 168 hours. No results for input(s): AMMONIA in the last 168 hours.  Coagulation Profile: No results for input(s): INR, PROTIME in the last 168 hours.  Cardiac Enzymes:  Recent Labs Lab 04/06/17 0135 04/06/17 0622 04/06/17 1500  TROPONINI 0.09* 0.11* 0.10*    BNP (last 3 results) No results for input(s): PROBNP in the last 8760 hours.  HbA1C: No results for input(s): HGBA1C in the last 72 hours.  CBG: No results for input(s): GLUCAP in the last 168 hours.  Lipid Profile: No results for input(s): CHOL, HDL, LDLCALC, TRIG, CHOLHDL, LDLDIRECT in the last 72 hours.  Thyroid Function Tests: No results for input(s): TSH, T4TOTAL, FREET4, T3FREE, THYROIDAB in the last 72 hours.  Anemia Panel: No results for input(s): VITAMINB12, FOLATE, FERRITIN, TIBC, IRON, RETICCTPCT in the last 72 hours.  Urine analysis:    Component Value Date/Time   COLORURINE YELLOW 12/11/2013 1733   APPEARANCEUR CLEAR 12/11/2013 1733   LABSPEC 1.014 12/11/2013 1733   PHURINE 5.5 12/11/2013 1733   GLUCOSEU NEGATIVE 12/11/2013 1733    HGBUR NEGATIVE 12/11/2013 1733   BILIRUBINUR NEGATIVE 12/11/2013 1733   KETONESUR NEGATIVE 12/11/2013 1733   PROTEINUR NEGATIVE 12/11/2013 1733   UROBILINOGEN 1.0 12/11/2013 1733   NITRITE NEGATIVE 12/11/2013 1733   LEUKOCYTESUR NEGATIVE 12/11/2013 1733    Sepsis Labs: Lactic Acid, Venous    Component Value Date/Time   LATICACIDVEN 0.83 12/11/2013 2000    MICROBIOLOGY: Recent Results (from the past 240 hour(s))  MRSA PCR Screening     Status: None   Collection Time: 04/06/17  9:25 PM  Result Value Ref Range Status   MRSA by PCR NEGATIVE NEGATIVE Final    Comment:        The GeneXpert MRSA Assay (FDA approved for NASAL specimens only), is one component of a comprehensive MRSA colonization surveillance program. It is not intended to diagnose MRSA infection nor to guide or monitor treatment for MRSA infections.     RADIOLOGY STUDIES/RESULTS: Dg Chest 2 View  Result Date: 04/05/2017 CLINICAL DATA:  Shortness of breath EXAM: CHEST  2 VIEW COMPARISON:  01/26/2017 FINDINGS: Hyperinflation. Tiny bilateral pleural effusions. Moderate-to-marked cardiomegaly with globular cardiac configuration. Vascular congestion and mild diffuse interstitial opacity, suspicious for mild edema. No pneumothorax. Degenerative changes of the spine. IMPRESSION: Cardiomegaly with vascular congestion and mild diffuse interstitial edema. Tiny pleural effusions. Electronically Signed   By: Donavan Foil M.D.   On: 04/05/2017 22:12     LOS: 1 day   Oren Binet, MD  [facility] Pager:[phone]  If 7PM-7AM, please contact night-coverage [email or web address] Password [email or web address] 04/07/2017, 9:34 AM

## 2017-04-07 NOTE — Progress Notes (Signed)
Patient ID: Gerald Nelson, male   DOB: [birth year], 66 y.o.   MRN: [id number]     Advanced Heart Failure Rounding Note   Subjective:    Breathing better today.  He diuresed well yesterday and weight is down.    Objective:   Weight Range: 144 lb 1.6 oz (65.4 kg) Body mass index is 20.68 kg/m.   Vital Signs:   Temp:  [97.8 F (36.6 C)-98.3 F (36.8 C)] 98.3 F (36.8 C) (10/04 0559) Pulse Rate:  [81-98] 81 (10/04 0559) Resp:  [20-25] 20 (10/04 0559) BP: (118-154)/(69-112) 130/81 (10/04 0559) SpO2:  [93 %-100 %] 94 % (10/04 0610) Weight:  [144 lb 1.6 oz (65.4 kg)-148 lb 13 oz (67.5 kg)] 144 lb 1.6 oz (65.4 kg) (10/04 0559) Last BM Date: 04/05/17  Weight change: Filed Weights   04/05/17 2137 04/06/17 1828 04/07/17 0559  Weight: 164 lb (74.4 kg) 148 lb 13 oz (67.5 kg) 144 lb 1.6 oz (65.4 kg)    Intake/Output:   Intake/Output Summary (Last 24 hours) at 04/07/17 0808 Last data filed at 04/07/17 0610  Gross per 24 hour  Intake              480 ml  Output             4700 ml  Net            -4220 ml      Physical Exam    General:  Well appearing. No resp difficulty HEENT: Normal Neck: Supple. JVP 12. Carotids 2+ bilat; no bruits. No lymphadenopathy or thyromegaly appreciated. Cor: PMI lateral. Regular rate & rhythm. No rubs, gallops or murmurs. Lungs: Clear bilaterally Abdomen: Soft, nontender, nondistended. No hepatosplenomegaly. No bruits or masses. Good bowel sounds. Extremities: No cyanosis, clubbing, rash, edema Neuro: Alert & orientedx3, cranial nerves grossly intact. moves all 4 extremities w/o difficulty. Affect pleasant   Telemetry   NSR (personally reviewed)   Labs    CBC  Recent Labs  04/05/17 2136  WBC 7.8  HGB 13.6  HCT 39.8  MCV 90.9  PLT 195   Basic Metabolic Panel  Recent Labs  04/05/17 2136  NA 132*  K 3.3*  CL 101  CO2 27  GLUCOSE 126*  BUN 18  CREATININE 1.87*  CALCIUM 9.0   Liver Function Tests No results for input(s):  AST, ALT, ALKPHOS, BILITOT, PROT, ALBUMIN in the last 72 hours. No results for input(s): LIPASE, AMYLASE in the last 72 hours. Cardiac Enzymes  Recent Labs  04/06/17 0135 04/06/17 0622 04/06/17 1500  TROPONINI 0.09* 0.11* 0.10*    BNP: BNP (last 3 results)  Recent Labs  01/03/17 2222 01/26/17 2152 04/05/17 2136  BNP 1,681.6* 3,641.0* 2,689.3*    ProBNP (last 3 results) No results for input(s): PROBNP in the last 8760 hours.   D-Dimer No results for input(s): DDIMER in the last 72 hours. Hemoglobin A1C No results for input(s): HGBA1C in the last 72 hours. Fasting Lipid Panel No results for input(s): CHOL, HDL, LDLCALC, TRIG, CHOLHDL, LDLDIRECT in the last 72 hours. Thyroid Function Tests No results for input(s): TSH, T4TOTAL, T3FREE, THYROIDAB in the last 72 hours.  Invalid input(s): FREET3  Other results:   Imaging     No results found.   Medications:     Scheduled Medications: . aspirin  325 mg Oral Daily  . enoxaparin (LOVENOX) injection  40 mg Subcutaneous Daily  . furosemide  80 mg Intravenous TID  . hydrALAZINE  50 mg Oral  Q8H  . Influenza vac split quadrivalent PF  0.5 mL Intramuscular Tomorrow-1000  . isosorbide mononitrate  60 mg Oral Daily  . mouth rinse  15 mL Mouth Rinse BID  . nicotine  21 mg Transdermal Daily  . potassium chloride  40 mEq Oral Once  . sodium chloride flush  3 mL Intravenous Q12H  . spironolactone  12.5 mg Oral Daily     Infusions: . sodium chloride       PRN Medications:  sodium chloride, acetaminophen, albuterol, ondansetron (ZOFRAN) IV, sodium chloride flush    Patient Profile   Gerald Nelson is a 66 year old with a history of chronci systolic heart failure, pulmonary htn on echo, smoker, CVA, schizophrenia, and CKD Stage III.   Admitted with A/C systolic heart failure.   Assessment/Plan   1. Acute on chronic systolic CHF: Echo (8/54) with EF 15%, coarse trabeculation at the apex, mod-severe Gerald,  mod-severe TR, PASP 73 mmHg.  I reviewed echo, no definite apical thrombus.  Cause of cardiomyopathy uncertain, has never had cath. He was admitted with volume overload, has diuresed well so far.  Still some volume overload.   - Will continue diurese with Lasix 80 mg IV every 8 hrs.  Needs labs this morning.  - Started spironolactone 12.5 daily.  - Increase hydralazine to 50 mg tid and Imdur to 60 daily.  - No ACEI/ARB/ARNI for now with elevated creatinine.   - He is not a candidate for advanced therapies.  Will hold off on PICC line for now, milrinone would be option if volume is difficult to manage but not home milrinone.  - Should ideally have ischemic workup => he currently is refusing LHC/RHC.  2. CKD stage 3: Creatinine 1.8 yesterday, pending today.   3. Smoker: 2 ppd.  Recommended discontinuation.  4. Elevated troponin: Peak 0.11.  No chest pain, no trend.  Suspect demand ischemia from volume overload.  Ideally would have left/right heart cath but currently refusing.   Length of Stay: 1   Gerald Champagne, MD  04/07/2017, 8:08 AM  Advanced Heart Failure Team Pager [phone] (M-F; 7a - 4p)  Please contact [facility] for night-coverage after hours (4p -7a ) and weekends on [email or web address]

## 2017-04-07 NOTE — Progress Notes (Signed)
Patient slept mostly during the night. Now on 2 L nasal cannula. Denies any pain. Diuresing well.   Will continue to monitor.  Olar Santini, RN

## 2017-04-17 NOTE — Progress Notes (Signed)
Cardiology Office Note   Date:  04/19/2017   ID:  [name], DOB 30-Jul-1950, MRN [id number]  PCP:  Gerald Low, MD  Cardiologist:   Minus Breeding, MD   Chief Complaint  Patient presents with  . Shortness of Breath      History of Present Illness: Gerald Nelson is a 66 y.o. male who presents for management of acute on chronic systolic HF.   He was in the hospital in July.  I reviewed these records for this visit.    He had acute respiratory failure.  He was seen by cardiology during that admission.  He had severe LV dysfunction.  EF was 15%.  There was moderate LVH.  There was moderate to severe MR.  He had elevated peak pulmonary pressures.  There was also a question of apical thrombus although this was not clearly identified.  Unfortunately, he left the hospital AMA.   He has left the hospital several times before and has limited therapeutic options because of social and psych issues and compliance.  He left on 10/3 and stays at a boarding house and is here with the house owner/caregiver.  She says that he continues to have DOE at rest and with minimal exertion.  He sleeps in a chair.  He has leg edema and obvious dyspnea with mild exertion with cough.  He denies chest pain, neck or arm pain.  He says he has been taking his meds but I cannot tell that he had any prescriptions at discharge and I am not sure that he is taking anything.  They cannot verify.  He eats "pasta and ground beef".  I am sure that he is not compliant with salt.  He does not drink alcohol but he has a strong addition to cigarettes which is why he leaves the hospital.  Of note he was supposed to be taking Lasix tid, hydral/nitrates.   Of note his weight on admission was 164 and appeared to be 144 at some point around his leaving but is 154 today.     Past Medical History:  Diagnosis Date  . Arthritis   . CHF (congestive heart failure) ([place])   . COPD (chronic obstructive pulmonary disease) ([place])   . Hernia,  hiatal   . Hypertension   . Schizophrenia [facility])     Past Surgical History:  Procedure Laterality Date  . PENILE PROSTHESIS IMPLANT       No current facility-administered medications for this visit.    No current outpatient prescriptions on file.   Facility-Administered Medications Ordered in Other Visits  Medication Dose Route Frequency Provider Last Rate Last Dose  . 0.9 %  sodium chloride infusion  250 mL Intravenous PRN Jettie Booze E, NP      . acetaminophen (TYLENOL) tablet 650 mg  650 mg Oral Q4H PRN Jettie Booze E, NP      . albuterol (PROVENTIL) (2.5 MG/3ML) 0.083% nebulizer solution 2.5 mg  2.5 mg Inhalation Q6H PRN Arbutus Leas, NP      . ALPRAZolam Duanne Moron) tablet 0.25 mg  0.25 mg Oral BID PRN Jettie Booze E, NP   0.25 mg at 04/19/17 0002  . aspirin EC tablet 81 mg  81 mg Oral Daily Jettie Booze E, NP   81 mg at 04/19/17 0858  . furosemide (LASIX) injection 80 mg  80 mg Intravenous TID Shirley Friar, PA-C   80 mg at 04/19/17 1021  . heparin injection 5,000 Units  5,000 Units  Subcutaneous Q8H Jettie Booze E, NP      . losartan (COZAAR) tablet 25 mg  25 mg Oral Daily Jettie Booze E, NP   25 mg at 04/19/17 0900  . nicotine (NICODERM CQ - dosed in mg/24 hours) patch 21 mg  21 mg Transdermal Daily Jettie Booze E, NP   21 mg at 04/19/17 0905  . ondansetron (ZOFRAN) injection 4 mg  4 mg Intravenous Q6H PRN Jettie Booze E, NP      . potassium chloride SA (K-DUR,KLOR-CON) CR tablet 40 mEq  40 mEq Oral BID Jettie Booze E, NP   40 mEq at 04/19/17 0858  . sodium chloride flush (NS) 0.9 % injection 3 mL  3 mL Intravenous Q12H Jettie Booze E, NP   3 mL at 04/19/17 0900  . sodium chloride flush (NS) 0.9 % injection 3 mL  3 mL Intravenous PRN Jettie Booze E, NP      . spironolactone (ALDACTONE) tablet 12.5 mg  12.5 mg Oral Daily [name], MD   12.5 mg at 04/19/17 1014    Allergies:   Sulfa antibiotics    Social History:  The patient  reports that he has been smoking Cigarettes.   He has been smoking about 1.50 packs per day. He has never used smokeless tobacco. He reports that he does not drink alcohol or use drugs.   He lives at a boarding house with a roommate and he has a caregiver.  Family History:  The patient's family history includes Hypertension in his father and mother.    ROS:  Please see the history of present illness.   Otherwise, review of systems are positive for none.   All other systems are reviewed and negative.    PHYSICAL EXAM: VS:  BP 130/84 (BP Location: Left Arm)   Pulse 84   Ht 5\' 10"  (1.778 m)   Wt 159 lb (72.1 kg)   BMI 22.81 kg/m  , BMI Body mass index is 22.81 kg/m. GENERAL:  Well appearing HEENT:  Pupils equal round and reactive, fundi not visualized, oral mucosa unremarkable NECK:  No jugular venous distention to the angle of the jaw at 45 degrees, waveform within normal limits, carotid upstroke brisk and symmetric, no bruits, no thyromegaly LYMPHATICS:  No cervical, inguinal adenopathy LUNGS:  Clear to auscultation bilaterally BACK:  No CVA tenderness CHEST:  Unremarkable HEART:  PMI not displaced or sustained,S1 and S2 within normal limits, positive S3, no S4, no clicks, no rubs, 3/6 apical systolic murmur, no diastolic murmurs ABD:  Distended, positive bowel sounds normal in frequency in pitch, no bruits, no rebound, no guarding, no midline pulsatile mass, positive hepatomegaly, no splenomegaly EXT:  2 plus pulses upper and decreased DP/PT bilateral, moderately severe edema, no cyanosis no clubbing SKIN:  No rashes no nodules NEURO:  Cranial nerves II through XII grossly intact, motor grossly intact throughout PSYCH:  Cognitively intact, oriented to person place and time    EKG:  EKG is not ordered today. The ekg ordered 04/05/17 sinus rhythm, rate 102, left atrial enlargement, right atrial enlargement, left axis deviation, premature ventricular contractions, LVH with repolarization changes.     Recent Labs: 04/18/2017: ALT  37; B Natriuretic Peptide 2,204.0; Hemoglobin 14.4; Magnesium 1.9; Platelets 234; TSH 3.458 04/19/2017: BUN 16; Creatinine, Ser 1.49; Potassium 3.7; Sodium 134    Lipid Panel No results found for: CHOL, TRIG, HDL, CHOLHDL, VLDL, LDLCALC, LDLDIRECT    Wt Readings from Last 3 Encounters:  04/19/17 156 lb  9.6 oz (71 kg)  04/18/17 159 lb (72.1 kg)  04/07/17 144 lb 1.6 oz (65.4 kg)      Other studies Reviewed: Additional studies/ records that were reviewed today include: Hospital records. Review of the above records demonstrates:  Please see elsewhere in the note.     ASSESSMENT AND PLAN:  CARDIOMYOPATHY:  The patient has end-stage cardiomyopathy class IV.  He is "warm and wet".  The etiology is not clear as he is not really around any diagnostic workup previously. As above is been noncompliant with social and psych issues. He does have been involved caretaker now who says that she will make sure he does whatever he needs to do. She's been in contact with his brother who seems to be also more committed. I told the patient that he would probably not survive more than a few weeks in his current situation and that death is imminent unless he complies with hospitalization and stays. I think if he leaves AGAINST MEDICAL ADVICE p.m. we would need to have an ethics review understand the futility of further efforts at treatment. He was a DNI when he was hospitalized previously and I think this would be appropriate. I did discuss his case with Dr. Aundra Dubin and they kindly consented to taking care of him again in the hospital. We are working for direct admission bed.    APICAL THROMBUS:  This was questionable. Regardless he would be a very poor anticoagulation candidate.  TR/MR:  This would be managed in the context of treating his decompensated CHF.   TOBACCO ABUSE:  I would suggest contacting his psychiatrist on admission to discuss his treatment as his anxiety over smoking needs to be treated to help  facilitate his admission.  CKD III:  He will have follow up of his labs in the hospital.   SCHIZOPHRENIA:  He can continue with current therapies.    Current medicines are reviewed at length with the patient today.  The patient does not have concerns regarding medicines.  The following changes have been made:  Will be addressed in the hospital  Labs/ tests ordered today include:  No orders of the defined types were placed in this encounter.    Disposition:   FU with me or [facility] at discharge.      Signed, Minus Breeding, MD  04/19/2017 12:42 PM    [facility]

## 2017-04-18 ENCOUNTER — Inpatient Hospital Stay (HOSPITAL_COMMUNITY)
Admission: AD | Admit: 2017-04-18 | Discharge: 2017-04-23 | DRG: 291 | Disposition: A | Discharge status: Home / Self Care | Payer: Medicare Other | Source: Ambulatory Visit | Attending: Cardiology | Admitting: Cardiology

## 2017-04-18 ENCOUNTER — Encounter: Payer: Self-pay | Admitting: Cardiology

## 2017-04-18 ENCOUNTER — Ambulatory Visit (INDEPENDENT_AMBULATORY_CARE_PROVIDER_SITE_OTHER): Payer: Medicare Other | Admitting: Cardiology

## 2017-04-18 VITALS — BP 130/84 | HR 84 | Ht 70.0 in | Wt 159.0 lb

## 2017-04-18 DIAGNOSIS — I130 Hypertensive heart and chronic kidney disease with heart failure and stage 1 through stage 4 chronic kidney disease, or unspecified chronic kidney disease: Principal | ICD-10-CM | POA: Diagnosis present

## 2017-04-18 DIAGNOSIS — Z720 Tobacco use: Secondary | ICD-10-CM | POA: Diagnosis not present

## 2017-04-18 DIAGNOSIS — N183 Chronic kidney disease, stage 3 unspecified: Secondary | ICD-10-CM | POA: Diagnosis present

## 2017-04-18 DIAGNOSIS — F17210 Nicotine dependence, cigarettes, uncomplicated: Secondary | ICD-10-CM | POA: Diagnosis present

## 2017-04-18 DIAGNOSIS — Z8673 Personal history of transient ischemic attack (TIA), and cerebral infarction without residual deficits: Secondary | ICD-10-CM

## 2017-04-18 DIAGNOSIS — R0602 Shortness of breath: Secondary | ICD-10-CM | POA: Diagnosis not present

## 2017-04-18 DIAGNOSIS — I700 Atherosclerosis of aorta: Secondary | ICD-10-CM | POA: Diagnosis present

## 2017-04-18 DIAGNOSIS — F209 Schizophrenia, unspecified: Secondary | ICD-10-CM | POA: Diagnosis present

## 2017-04-18 DIAGNOSIS — I5041 Acute combined systolic (congestive) and diastolic (congestive) heart failure: Secondary | ICD-10-CM | POA: Diagnosis not present

## 2017-04-18 DIAGNOSIS — E871 Hypo-osmolality and hyponatremia: Secondary | ICD-10-CM | POA: Diagnosis present

## 2017-04-18 DIAGNOSIS — Z8249 Family history of ischemic heart disease and other diseases of the circulatory system: Secondary | ICD-10-CM

## 2017-04-18 DIAGNOSIS — I8290 Acute embolism and thrombosis of unspecified vein: Secondary | ICD-10-CM

## 2017-04-18 DIAGNOSIS — J449 Chronic obstructive pulmonary disease, unspecified: Secondary | ICD-10-CM | POA: Diagnosis present

## 2017-04-18 DIAGNOSIS — Z79899 Other long term (current) drug therapy: Secondary | ICD-10-CM

## 2017-04-18 DIAGNOSIS — Z91199 Patient's noncompliance with other medical treatment and regimen due to unspecified reason: Secondary | ICD-10-CM

## 2017-04-18 DIAGNOSIS — E785 Hyperlipidemia, unspecified: Secondary | ICD-10-CM

## 2017-04-18 DIAGNOSIS — Z882 Allergy status to sulfonamides status: Secondary | ICD-10-CM

## 2017-04-18 DIAGNOSIS — I240 Acute coronary thrombosis not resulting in myocardial infarction: Secondary | ICD-10-CM | POA: Diagnosis present

## 2017-04-18 DIAGNOSIS — Z7982 Long term (current) use of aspirin: Secondary | ICD-10-CM | POA: Diagnosis not present

## 2017-04-18 DIAGNOSIS — Z515 Encounter for palliative care: Secondary | ICD-10-CM

## 2017-04-18 DIAGNOSIS — Z7189 Other specified counseling: Secondary | ICD-10-CM | POA: Diagnosis not present

## 2017-04-18 DIAGNOSIS — M1990 Unspecified osteoarthritis, unspecified site: Secondary | ICD-10-CM | POA: Diagnosis present

## 2017-04-18 DIAGNOSIS — I255 Ischemic cardiomyopathy: Secondary | ICD-10-CM | POA: Diagnosis present

## 2017-04-18 DIAGNOSIS — I5023 Acute on chronic systolic (congestive) heart failure: Secondary | ICD-10-CM | POA: Diagnosis present

## 2017-04-18 DIAGNOSIS — Z9119 Patient's noncompliance with other medical treatment and regimen: Secondary | ICD-10-CM

## 2017-04-18 DIAGNOSIS — I10 Essential (primary) hypertension: Secondary | ICD-10-CM | POA: Diagnosis present

## 2017-04-18 DIAGNOSIS — I340 Nonrheumatic mitral (valve) insufficiency: Secondary | ICD-10-CM

## 2017-04-18 DIAGNOSIS — I272 Pulmonary hypertension, unspecified: Secondary | ICD-10-CM | POA: Diagnosis present

## 2017-04-18 DIAGNOSIS — R0600 Dyspnea, unspecified: Secondary | ICD-10-CM | POA: Diagnosis present

## 2017-04-18 HISTORY — DX: Chronic kidney disease, stage 3 (moderate): N18.3

## 2017-04-18 HISTORY — DX: Nonrheumatic mitral (valve) insufficiency: I34.0

## 2017-04-18 HISTORY — DX: Intracardiac thrombosis, not elsewhere classified: I51.3

## 2017-04-18 HISTORY — DX: Pulmonary hypertension, unspecified: I27.20

## 2017-04-18 HISTORY — DX: Chronic systolic (congestive) heart failure: I50.22

## 2017-04-18 HISTORY — DX: Hypo-osmolality and hyponatremia: E87.1

## 2017-04-18 HISTORY — DX: Tobacco use: Z72.0

## 2017-04-18 HISTORY — DX: Chronic kidney disease, stage 3 unspecified: N18.30

## 2017-04-18 HISTORY — DX: Hyperlipidemia, unspecified: E78.5

## 2017-04-18 LAB — CBC
HCT: 42.1 % (ref 39.0–52.0)
HEMOGLOBIN: 14.4 g/dL (ref 13.0–17.0)
MCH: 30.8 pg (ref 26.0–34.0)
MCHC: 34.2 g/dL (ref 30.0–36.0)
MCV: 90 fL (ref 78.0–100.0)
PLATELETS: 234 10*3/uL (ref 150–400)
RBC: 4.68 MIL/uL (ref 4.22–5.81)
RDW: 14.1 % (ref 11.5–15.5)
WBC: 6.8 10*3/uL (ref 4.0–10.5)

## 2017-04-18 LAB — MAGNESIUM: MAGNESIUM: 1.9 mg/dL (ref 1.7–2.4)

## 2017-04-18 LAB — COMPREHENSIVE METABOLIC PANEL
ALT: 37 U/L (ref 17–63)
AST: 60 U/L — AB (ref 15–41)
Albumin: 3.2 g/dL — ABNORMAL LOW (ref 3.5–5.0)
Alkaline Phosphatase: 85 U/L (ref 38–126)
Anion gap: 11 (ref 5–15)
BILIRUBIN TOTAL: 1.1 mg/dL (ref 0.3–1.2)
BUN: 15 mg/dL (ref 6–20)
CHLORIDE: 97 mmol/L — AB (ref 101–111)
CO2: 24 mmol/L (ref 22–32)
CREATININE: 1.56 mg/dL — AB (ref 0.61–1.24)
Calcium: 9.1 mg/dL (ref 8.9–10.3)
GFR calc Af Amer: 52 mL/min — ABNORMAL LOW (ref >60)
GFR, EST NON AFRICAN AMERICAN: 45 mL/min — AB (ref >60)
GLUCOSE: 101 mg/dL — AB (ref 65–99)
POTASSIUM: 4.2 mmol/L (ref 3.5–5.1)
SODIUM: 132 mmol/L — AB (ref 135–145)
Total Protein: 6.8 g/dL (ref 6.5–8.1)

## 2017-04-18 LAB — TSH: TSH: 3.458 u[IU]/mL (ref 0.350–4.500)

## 2017-04-18 LAB — BRAIN NATRIURETIC PEPTIDE: B Natriuretic Peptide: 2204 pg/mL — ABNORMAL HIGH (ref 0.0–100.0)

## 2017-04-18 MED ORDER — SODIUM CHLORIDE 0.9% FLUSH
3.0000 mL | Freq: Two times a day (BID) | INTRAVENOUS | Status: DC
  Administered 2017-04-19 – 2017-04-23 (×9): 3 mL via INTRAVENOUS

## 2017-04-18 MED ORDER — ALPRAZOLAM 0.25 MG PO TABS
0.2500 mg | ORAL_TABLET | Freq: Two times a day (BID) | ORAL | Status: DC | PRN
  Administered 2017-04-19 – 2017-04-23 (×2): 0.25 mg via ORAL
  Filled 2017-04-18 (×2): qty 1

## 2017-04-18 MED ORDER — POTASSIUM CHLORIDE CRYS ER 20 MEQ PO TBCR
40.0000 meq | EXTENDED_RELEASE_TABLET | Freq: Two times a day (BID) | ORAL | Status: DC
  Administered 2017-04-18 – 2017-04-21 (×6): 40 meq via ORAL
  Filled 2017-04-18 (×6): qty 2

## 2017-04-18 MED ORDER — ALBUTEROL SULFATE (2.5 MG/3ML) 0.083% IN NEBU: 2.5000 mg | INHALATION_SOLUTION | Freq: Four times a day (QID) | RESPIRATORY_TRACT | Status: DC | PRN

## 2017-04-18 MED ORDER — SODIUM CHLORIDE 0.9 % IV SOLN: 250.0000 mL | INTRAVENOUS | Status: DC | PRN

## 2017-04-18 MED ORDER — HEPARIN SODIUM (PORCINE) 5000 UNIT/ML IJ SOLN
5000.0000 [IU] | Freq: Three times a day (TID) | INTRAMUSCULAR | Status: DC
  Administered 2017-04-19 – 2017-04-23 (×10): 5000 [IU] via SUBCUTANEOUS
  Filled 2017-04-18 (×10): qty 1

## 2017-04-18 MED ORDER — ASPIRIN EC 81 MG PO TBEC
81.0000 mg | DELAYED_RELEASE_TABLET | Freq: Every day | ORAL | Status: DC
  Administered 2017-04-19 – 2017-04-23 (×5): 81 mg via ORAL
  Filled 2017-04-18 (×5): qty 1

## 2017-04-18 MED ORDER — NICOTINE 21 MG/24HR TD PT24
21.0000 mg | MEDICATED_PATCH | Freq: Every day | TRANSDERMAL | Status: DC
  Administered 2017-04-19 – 2017-04-23 (×6): 21 mg via TRANSDERMAL
  Filled 2017-04-18 (×7): qty 1

## 2017-04-18 MED ORDER — LOSARTAN POTASSIUM 25 MG PO TABS
25.0000 mg | ORAL_TABLET | Freq: Every day | ORAL | Status: DC
  Administered 2017-04-19 – 2017-04-21 (×3): 25 mg via ORAL
  Filled 2017-04-18 (×3): qty 1

## 2017-04-18 MED ORDER — ONDANSETRON HCL 4 MG/2ML IJ SOLN
4.0000 mg | Freq: Four times a day (QID) | INTRAMUSCULAR | Status: DC | PRN
  Administered 2017-04-20: 4 mg via INTRAVENOUS
  Filled 2017-04-18: qty 2

## 2017-04-18 MED ORDER — SODIUM CHLORIDE 0.9% FLUSH: 3.0000 mL | INTRAVENOUS | Status: DC | PRN

## 2017-04-18 MED ORDER — DIGOXIN 125 MCG PO TABS
0.1250 mg | ORAL_TABLET | Freq: Every day | ORAL | Status: DC
  Administered 2017-04-19: 0.125 mg via ORAL
  Filled 2017-04-18: qty 1

## 2017-04-18 MED ORDER — CLONIDINE HCL 0.1 MG PO TABS
0.1000 mg | ORAL_TABLET | Freq: Every day | ORAL | Status: DC
  Administered 2017-04-19: 0.1 mg via ORAL
  Filled 2017-04-18: qty 1

## 2017-04-18 MED ORDER — ACETAMINOPHEN 325 MG PO TABS
650.0000 mg | ORAL_TABLET | ORAL | Status: DC | PRN
  Administered 2017-04-20 – 2017-04-23 (×4): 650 mg via ORAL
  Filled 2017-04-18 (×4): qty 2

## 2017-04-18 MED ORDER — FUROSEMIDE 10 MG/ML IJ SOLN
80.0000 mg | Freq: Two times a day (BID) | INTRAMUSCULAR | Status: DC
  Filled 2017-04-18: qty 8

## 2017-04-18 NOTE — Progress Notes (Signed)
Pt direct admit from the office. Pt alert and oriented and placed on tele. Pt states he just wants to eat. NP called for orders.

## 2017-04-18 NOTE — H&P (Signed)
Cardiology Admission History and Physical:   Patient ID: Gerald Nelson; MRN: [id number]; DOB: [birth year]   Admission date: 04/18/2017  Primary Care Provider: [name], MD Primary Cardiologist: Dr. Percival Spanish  Primary Electrophysiologist:  n/a  Chief Complaint:  Acute on chronic systolic CHF  Patient Profile:   Gerald Nelson is a 66 y.o. male with a history of CHF (Echo 01/2017 EF 15%), pulmonary HTN, CVA, schizophrenia, CKD stage III.  History of Present Illness:   Gerald Nelson is a 66 y.o. male who presents for management of acute on chronic systolic HF. He has severe LV dysfunction.  EF was 15%.  There was moderate LVH.  There was moderate to severe MR.  He had elevated peak pulmonary pressures.  There was also a question of apical thrombus although this was not clearly identified.  Unfortunately, he left the hospital AMA.   He has left the hospital several times before and has limited therapeutic options because of social and psych issues and compliance.  He left on 10/3 and stays at a boarding house and is here with the house owner/caregiver.  She says that he continues to have DOE at rest and with minimal exertion.  He sleeps in a chair.  He has leg edema and obvious dyspnea with mild exertion with cough.  He denies chest pain, neck or arm pain.  He says he has been taking his meds but per Dr. Percival Spanish at today's office visit - I cannot tell that he had any prescriptions at discharge and I am not sure that he is taking anything.  They cannot verify.  He eats "pasta and ground beef".  I am sure that he is not compliant with salt.  He does not drink alcohol but he has a strong addition to cigarettes which is why he leaves the hospital.  Of note he was supposed to be taking Lasix tid, hydral/nitrates.   Of note his weight on admission was 164 and appeared to be 144 at some point around his leaving but is 154 today.    He was seen today by Dr. Percival Spanish and admitted from the office.   ECHO  01/2017  LVEF 15%, severe global hypokinesis, moderate LVH, coarse trabeculation of the LV apex with numerous false tendinae of the left ventricle, very stagnant blood flow at the LV apex with smoke but no obvious LV thrombus - Definity contrast was given, again, noting stagnant apical blood flow - this could suggest recent thrombus and certainly high risk for apical thrombus formation. Other findings include aortic sclerosis with mild AI, moderate to severe MR, moderate LAE, mild RAE, moderate to severe TR, moderate to severe pulmonary hypertension (RVSP 73 mmHg), dilated IVC, trivial posterior pericardial effusion - tamponade physiology cannot be excluded given the degree of pulmonary hypertension.  Past Medical History:  Diagnosis Date  . Arthritis   . CHF (congestive heart failure) ([place])   . COPD (chronic obstructive pulmonary disease) ([place])   . Hernia, hiatal   . Hypertension   . Schizophrenia [facility])     Past Surgical History:  Procedure Laterality Date  . PENILE PROSTHESIS IMPLANT       Medications Prior to Admission: Prior to Admission medications   Medication Sig Start Date End Date Taking? Authorizing Provider  albuterol (PROVENTIL HFA;VENTOLIN HFA) 108 (90 Base) MCG/ACT inhaler Inhale 1-2 puffs into the lungs every 6 (six) hours as needed for wheezing or shortness of breath.    [provider]  aspirin 325 MG  tablet Take 325 mg by mouth daily.    [provider]  cloNIDine (CATAPRES) 0.1 MG tablet Take 0.1 mg by mouth daily.    [provider]  diltiazem (CARDIZEM CD) 240 MG 24 hr capsule Take 240 mg by mouth every evening. 04/11/17   [provider]  furosemide (LASIX) 40 MG tablet Take 40 mg by mouth daily.    [provider]  haloperidol decanoate (HALDOL DECANOATE) 100 MG/ML injection Inject 100 mg into the muscle every 30 (thirty) days.  01/07/17   [provider]     Allergies:    Allergies   Allergen Reactions  . Sulfa Antibiotics Nausea Only    Social History:   Social History   Social History  . Marital status: Single    Spouse name: N/A  . Number of children: N/A  . Years of education: N/A   Occupational History  . Not on file.   Social History Main Topics  . Smoking status: Current Every Day Smoker    Packs/day: 1.50    Types: Cigarettes  . Smokeless tobacco: Never Used  . Alcohol use No  . Drug use: No  . Sexual activity: Not on file   Other Topics Concern  . Not on file   Social History Narrative  . No narrative on file    Family History:   The patient's family history includes Hypertension in his father and mother.    ROS:  Please see the history of present illness.  All other ROS reviewed and negative.     Physical Exam/Data:   Vitals:   04/18/17 1813  BP: (!) 150/112  Pulse: 96  Resp: 18  Temp: 98.6 F (37 C)  TempSrc: Oral  SpO2: 96%  Weight: 158 lb (71.7 kg)  Height: 5\' 10"  (1.778 m)   No intake or output data in the 24 hours ending 04/18/17 1834 Filed Weights   04/18/17 1813  Weight: 158 lb (71.7 kg)   Body mass index is 22.67 kg/m.  General:  Well nourished, well developed, in no acute distress HEENT: normal Lymph: no adenopathy Neck: JVP to jaw.  Endocrine:  No thryomegaly Vascular: No carotid bruits; FA pulses 2+ bilaterally without bruits  Cardiac:  RRR, +S3 Lungs:  clear to auscultation bilaterally, no wheezing, rhonchi or rales  Abd: distended, soft, nontender, no hepatomegaly  Ext: Trace pedal edema  Musculoskeletal:  No deformities, BUE and BLE strength normal and equal Skin: warm and dry  Neuro:  CNs 2-12 intact, no focal abnormalities noted Psych:  Normal affect    EKG:  The ECG that was done 04/06/17  was personally reviewed and demonstrates sinus tach with RAE  Relevant CV Studies: Transthoracic Echocardiography 01/04/17 Study Conclusions  - Left ventricle: The cavity size was normal. Wall thickness  was   increased in a pattern of moderate LVH. There was severe focal   basal hypertrophy of the septum. Coarse trabeculation at the LV   apex with multiple false tendinae. Does not meet diagnostic   criteria for non-compaction. LV apical smoke noted, but no   thrombus. Systolic function was severely reduced. The estimated   ejection fraction was 15%. Diffuse hypokinesis. The study is not   technically sufficient to allow evaluation of LV diastolic   function. - Aortic valve: Trileaflet. Sclerosis without stenosis. There was   mild regurgitation. - Mitral valve: Mildly thickened leaflets . There was moderate to   severe regurgitation. - Left atrium: Moderately dilated. - Right ventricle:  The cavity size was normal. Systolic function   was low normal. - Right atrium: The atrium was mildly dilated. - Atrial septum: No defect or patent foramen ovale was identified. - Tricuspid valve: There was moderate to severe regurgitation. - Pulmonary arteries: Dilated. PA peak pressure: 73 mm Hg (S). - Inferior vena cava: The vessel was dilated. The respirophasic   diameter changes were blunted (< 50%), consistent with elevated   central venous pressure. - Pericardium, extracardiac: A trivial pericardial effusion was   identified posterior to the heart. Tamponade physiology cannot be   evaluated in the setting of severe pulmonary hypertension.   Clinical correlation is always advised.  Impressions:  - LVEF 15%, severe global hypokinesis, moderate LVH, coarse   trabeculation of the LV apex with numerous false tendinae of the   left ventricle, very stagnant blood flow at the LV apex with   smoke but no obvious LV thrombus - Definity contrast was given,   again, noting stagnant apical blood flow - this could suggest   recent thrombus and certainly high risk for apical thrombus   formation. Other findings include aortic sclerosis with mild AI,   moderate to severe MR, moderate LAE, mild RAE,  moderate to severe   TR, moderate to severe pulmonary hypertension (RVSP 73 mmHg),   dilated IVC, trivial posterior pericardial effusion - tamponade   physiology cannot be excluded given the degree of pulmonary   hypertension.   Laboratory Data:  ChemistryNo results for input(s): NA, K, CL, CO2, GLUCOSE, BUN, CREATININE, CALCIUM, GFRNONAA, GFRAA, ANIONGAP in the last 168 hours.  No results for input(s): PROT, ALBUMIN, AST, ALT, ALKPHOS, BILITOT in the last 168 hours. HematologyNo results for input(s): WBC, RBC, HGB, HCT, MCV, MCH, MCHC, RDW, PLT in the last 168 hours. Cardiac EnzymesNo results for input(s): TROPONINI in the last 168 hours. No results for input(s): TROPIPOC in the last 168 hours.  BNPNo results for input(s): BNP, PROBNP in the last 168 hours.  DDimer No results for input(s): DDIMER in the last 168 hours.  Radiology/Studies:  No results found.  Assessment and Plan:    1. Acute on chronic systolic CHF: Echo 07/4429 EF 15%. Has not had an ischemic work up due to leaving [place].  - NYHA IV - Start lasix 80 mg BID - Start losartan 25 mg daily - Stop diltiazem.  - Start digoxin 0.125 mg daily. - No beta blocker with acute decompensation.   2. Questionable apical thrombus:   - poor anticoagulation candidate with non compliance.   3. TR/MR  This would be managed in the context of treating his decompensated CHF.   4. Tobacco abuse :   - Continue nicotene patch   5. CKD III:   - Follow daily BMET  6. SCHIZOPHRENIA:   - Continue current meds.   Severity of Illness: The appropriate patient status for this patient is INPATIENT. Inpatient status is judged to be reasonable and necessary in order to provide the required intensity of service to ensure the patient's safety. The patient's presenting symptoms, physical exam findings, and initial radiographic and laboratory data in the context of their chronic comorbidities is felt to place them at high risk for further  clinical deterioration. Furthermore, it is not anticipated that the patient will be medically stable for discharge from the hospital within 2 midnights of admission. The following factors support the patient status of inpatient.   " The patient's presenting symptoms include SOB . " The worrisome physical exam findings include  BLE edema  " The initial radiographic and laboratory data are worrisome because of elevated creatinine " The chronic co-morbidities include HTN   * I certify that at the point of admission it is my clinical judgment that the patient will require inpatient hospital care spanning beyond 2 midnights from the point of admission due to high intensity of service, high risk for further deterioration and high frequency of surveillance required.*    For questions or updates, please contact [place] Please consult [email or web address] for contact info under Cardiology/STEMI.    Signed, Arbutus Leas, NP  04/18/2017 6:34 PM   Patient seen with NP, agree with the above note.  Patient was re-admitted after leaving AMA.  Social/psych issues have resulted in marked noncompliance.  He is markedly volume overloaded as noted above. We will diurese and start on HF meds.  Will try again to encourage compliance, paramedicine may be an option.  He will need a nicotine patch.   Loralie Champagne 04/19/2017

## 2017-04-19 ENCOUNTER — Encounter: Payer: Self-pay | Admitting: Cardiology

## 2017-04-19 ENCOUNTER — Inpatient Hospital Stay (HOSPITAL_COMMUNITY): Payer: Medicare Other

## 2017-04-19 ENCOUNTER — Encounter (HOSPITAL_COMMUNITY): Payer: Self-pay

## 2017-04-19 DIAGNOSIS — Z720 Tobacco use: Secondary | ICD-10-CM | POA: Insufficient documentation

## 2017-04-19 LAB — BASIC METABOLIC PANEL
ANION GAP: 15 (ref 5–15)
Anion gap: 10 (ref 5–15)
BUN: 16 mg/dL (ref 6–20)
BUN: 15 mg/dL (ref 6–20)
CALCIUM: 8.7 mg/dL — AB (ref 8.9–10.3)
CALCIUM: 9.2 mg/dL (ref 8.9–10.3)
CO2: 23 mmol/L (ref 22–32)
CO2: 22 mmol/L (ref 22–32)
CREATININE: 1.49 mg/dL — AB (ref 0.61–1.24)
CREATININE: 1.66 mg/dL — AB (ref 0.61–1.24)
Chloride: 101 mmol/L (ref 101–111)
Chloride: 99 mmol/L — ABNORMAL LOW (ref 101–111)
GFR calc Af Amer: 55 mL/min — ABNORMAL LOW (ref >60)
GFR calc non Af Amer: 48 mL/min — ABNORMAL LOW (ref >60)
GFR, EST AFRICAN AMERICAN: 49 mL/min — AB (ref >60)
GFR, EST NON AFRICAN AMERICAN: 42 mL/min — AB (ref >60)
GLUCOSE: 128 mg/dL — AB (ref 65–99)
GLUCOSE: 98 mg/dL (ref 65–99)
Potassium: 3.7 mmol/L (ref 3.5–5.1)
Potassium: 4.8 mmol/L (ref 3.5–5.1)
Sodium: 134 mmol/L — ABNORMAL LOW (ref 135–145)
Sodium: 136 mmol/L (ref 135–145)

## 2017-04-19 LAB — MAGNESIUM: Magnesium: 1.8 mg/dL (ref 1.7–2.4)

## 2017-04-19 LAB — INFLUENZA PANEL BY PCR (TYPE A & B)
INFLAPCR: NEGATIVE
INFLBPCR: NEGATIVE

## 2017-04-19 MED ORDER — METOLAZONE 2.5 MG PO TABS
2.5000 mg | ORAL_TABLET | Freq: Once | ORAL | Status: AC
  Administered 2017-04-19: 2.5 mg via ORAL
  Filled 2017-04-19: qty 1

## 2017-04-19 MED ORDER — SPIRONOLACTONE 25 MG PO TABS
12.5000 mg | ORAL_TABLET | Freq: Every day | ORAL | Status: DC
  Administered 2017-04-19: 12.5 mg via ORAL
  Filled 2017-04-19: qty 1

## 2017-04-19 MED ORDER — FUROSEMIDE 10 MG/ML IJ SOLN
80.0000 mg | Freq: Three times a day (TID) | INTRAMUSCULAR | Status: DC
  Administered 2017-04-19 – 2017-04-21 (×7): 80 mg via INTRAVENOUS
  Filled 2017-04-19 (×6): qty 8

## 2017-04-19 MED ORDER — HYDRALAZINE HCL 25 MG PO TABS
25.0000 mg | ORAL_TABLET | Freq: Three times a day (TID) | ORAL | Status: DC
  Administered 2017-04-19 – 2017-04-20 (×2): 25 mg via ORAL
  Filled 2017-04-19 (×3): qty 1

## 2017-04-19 MED ORDER — IPRATROPIUM-ALBUTEROL 0.5-2.5 (3) MG/3ML IN SOLN
3.0000 mL | Freq: Four times a day (QID) | RESPIRATORY_TRACT | Status: DC
  Administered 2017-04-19 – 2017-04-21 (×8): 3 mL via RESPIRATORY_TRACT
  Filled 2017-04-19 (×8): qty 3

## 2017-04-19 NOTE — Clinical Social Work Note (Signed)
CSW acknowledges SNF consult. Please place PT orders for evaluation recommendations.  Dayton Scrape, [place]

## 2017-04-19 NOTE — Progress Notes (Addendum)
Paged Dr to let know pt had a run of Vtach and BP are very elevated: 164/118 on Left arm and 157/126 on Right arm. Waiting on callback. Patient is asymptomatic, patient denies chest pain and SOB. Placed on 2L oxygen  MD returned call. MD aware elevated HR, runs of vtach and elevated BP. MD stated will place orders for lab draws Will continue to monitor.

## 2017-04-19 NOTE — Progress Notes (Signed)
Patient A&Ox4. ST/SR on telemetry. BP Elevated. RA O2 Sats >96%. Skin dry with no open wounds. Low fall risk. CHF plan of care initiated.

## 2017-04-19 NOTE — Progress Notes (Signed)
Advanced Heart Failure Rounding Note  Primary Cardiologist: Dr. Percival Spanish HF: Dr. Aundra Dubin   Subjective:    Admitted 04/18/17 with worsening SOB. Of note, left hospital [facility] 04/07/17.  Remains very SOB. Refuses cardiac catheterization. Pulled IV line out, but states it was an accident.   Negative 120 cc and down 2 lbs  CXR with small left effusion and pulmonary edema.   Objective:   Weight Range: 156 lb 9.6 oz (71 kg) Body mass index is 22.47 kg/m.   Vital Signs:   Temp:  [97.6 F (36.4 C)-98.6 F (37 C)] 97.6 F (36.4 C) (10/16 0328) Pulse Rate:  [94-106] 95 (10/16 0859) Resp:  [18-24] 20 (10/16 0328) BP: (142-179)/(96-116) 142/96 (10/16 0859) SpO2:  [96 %-98 %] 96 % (10/16 0859) FiO2 (%):  [0 %] 0 % (10/15 1936) Weight:  [156 lb 9.6 oz (71 kg)-158 lb (71.7 kg)] 156 lb 9.6 oz (71 kg) (10/16 0328) Last BM Date: 04/18/17  Weight change: Filed Weights   04/18/17 1813 04/19/17 0328  Weight: 158 lb (71.7 kg) 156 lb 9.6 oz (71 kg)    Intake/Output:   Intake/Output Summary (Last 24 hours) at 04/19/17 0939 Last data filed at 04/19/17 0900  Gross per 24 hour  Intake              483 ml  Output              900 ml  Net             -417 ml      Physical Exam    General:  Elderly and chronically ill appearing. Dyspneic at rest.  HEENT: Normal Neck: Supple. JVP to jaw. Carotids 2+ bilat; no bruits. No lymphadenopathy or thyromegaly appreciated. Cor: PMI nondisplaced. Regular, slightly tachy. No rubs, gallops. 2/6 HSM apex.  Lungs: Diminished throughout with basilar crackles and dull left sounds.  Abdomen: Soft, nontender, nondistended. No hepatosplenomegaly. No bruits or masses. Good bowel sounds. Extremities: No cyanosis, clubbing, or rash. Chronic 1+ edema.  Neuro: Alert & orientedx3, cranial nerves grossly intact. moves all 4 extremities w/o difficulty. Affect flat   Telemetry   NSR 90-100s, Personally reviewed.  EKG    N/A  Labs    CBC  Recent  Labs  04/18/17 2026  WBC 6.8  HGB 14.4  HCT 42.1  MCV 90.0  PLT 643   Basic Metabolic Panel  Recent Labs  04/18/17 2026 04/19/17 0512  NA 132* 134*  K 4.2 3.7  CL 97* 101  CO2 24 23  GLUCOSE 101* 128*  BUN 15 16  CREATININE 1.56* 1.49*  CALCIUM 9.1 8.7*  MG 1.9  --    Liver Function Tests  Recent Labs  04/18/17 2026  AST 60*  ALT 37  ALKPHOS 85  BILITOT 1.1  PROT 6.8  ALBUMIN 3.2*   No results for input(s): LIPASE, AMYLASE in the last 72 hours. Cardiac Enzymes No results for input(s): CKTOTAL, CKMB, CKMBINDEX, TROPONINI in the last 72 hours.  BNP: BNP (last 3 results)  Recent Labs  01/26/17 2152 04/05/17 2136 04/18/17 2026  BNP 3,641.0* 2,689.3* 2,204.0*    ProBNP (last 3 results) No results for input(s): PROBNP in the last 8760 hours.   D-Dimer No results for input(s): DDIMER in the last 72 hours. Hemoglobin A1C No results for input(s): HGBA1C in the last 72 hours. Fasting Lipid Panel No results for input(s): CHOL, HDL, LDLCALC, TRIG, CHOLHDL, LDLDIRECT in the last 72 hours. Thyroid Function Tests  Recent Labs  04/18/17 2026  TSH 3.458    Other results:   Imaging    Dg Chest Port 1 View  Result Date: 04/19/2017 CLINICAL DATA:  Shortness of breath, productive cough EXAM: PORTABLE CHEST 1 VIEW COMPARISON:  04/05/2017 FINDINGS: Cardiomegaly with vascular congestion. Improving interstitial prominence, likely improving interstitial edema. Small left effusion. Minimal left base atelectasis. IMPRESSION: Slight improvement in pulmonary edema pattern. Small left effusion with left base atelectasis. Electronically Signed   By: Rolm Baptise M.D.   On: 04/19/2017 08:22      Medications:     Scheduled Medications: . aspirin EC  81 mg Oral Daily  . cloNIDine  0.1 mg Oral Daily  . digoxin  0.125 mg Oral Daily  . furosemide  80 mg Intravenous BID  . heparin  5,000 Units Subcutaneous Q8H  . losartan  25 mg Oral Daily  . nicotine  21 mg  Transdermal Daily  . potassium chloride  40 mEq Oral BID  . sodium chloride flush  3 mL Intravenous Q12H     Infusions: . sodium chloride       PRN Medications:  sodium chloride, acetaminophen, albuterol, ALPRAZolam, ondansetron (ZOFRAN) IV, sodium chloride flush    Patient Profile   Gerald Nelson is a 66 y.o. male with a history of CHF (Echo 01/2017 EF 15%), pulmonary HTN, CVA, schizophrenia, CKD stage III.  Admitted 04/18/17 with worsening DOE.   Assessment/Plan   1. Acute on chronic systolic CHF: Echo 10/2681 EF 15%.  - Has not had an ischemic work up due to leaving [place].  - NYHA IIIb-IV symptoms - Remains markedly volume overloaded on exam. - Increase lasix to 80 mg IV TID. Will aggressively diurese, as suspect pt will leave AMA once he begins to feel better.  - Give metolazone 2.5 mg x 1 and follow response.  - Continue losartan 25 mg daily. Would ideally use Entresto, but non-compliance complicates (would need more regular labwork) - Continue digoxin 0.125 mg daily for now. Will need to follow closely with poor compliance.  - No beta blocker with acute decompensation.   2. Questionable apical thrombus:  - poor anticoagulation candidate with non compliance.   3. TR/MR  - No change.   4. Tobacco abuse - Encouraged cessation.    5. CKD III:  - Relatively stable. Continue to follow with diuresis.   6. SCHIZOPHRENIA:  - Continue current meds.   7. HTN - Clonidine not ideal with compliance. Will diurese and follow.   Prognosis guarded. Needs ischemic work up but refuses. Ideally would go to SNF on d/c. Do not think he will agree to this.   Length of Stay: 1  Annamaria Helling  04/19/2017, 9:39 AM  Advanced Heart Failure Team Pager [phone] (M-F; 7a - 4p)  Please contact [facility] for night-coverage after hours (4p -7a ) and weekends on [email or web address]  Patient seen with PA, agree with the above note.  1. Acute on chronic systolic  CHF: LN98% with moderate to severe MR on 7/18 echo.  Cause of cardiomyopathy uncertain.  High risk for ischemic CMP with heavy smoking, HTN but he has refused cath multiple times (including this admission so far). Cannot rule out hypertensive cardiomyopathy.  He left AMA last admission prior to full diuresis and apparently was not on meds at home. He is volume overloaded and dyspneic today, pulmonary edema on CXR.  - Lasix 80 mg IV every 8 hrs + KCl.  Will give dose of  metolazone 2.5 x 1.  - Add losartan 25 mg daily and spironolactone 12.5 daily.  Ultimately may require Bidil to help bring pressure down but he is unlikely to be compliant with this at home.  - Would hold off on digoxin at this point.  - Reassess cardiac cath later in hospital course.  2. CKD: Stage 3.  Follow carefully with diuresis.  3. Tobacco abuse: Needs nicotine patch here.  4. COPD: Strongly suspect significant COPD, breathes through pursed lips.  - Add Duonebs.  5. Mitral regurgitation: Moderate-severe on last echo, suspect functional. Treat volume overload.  6. HTN: Treat as above. Would avoid clonidine with markedly low EF.  7. Compliance: Poor compliance from social issues and schizophrenia is a huge problem here. Will involve palliative care to assist with code status.  I think he ideally would be in a SNF but I doubt he is going to agree with this.   Loralie Champagne 04/19/2017 10:17 AM

## 2017-04-19 NOTE — Progress Notes (Deleted)
Patient Alert and oriented x 4. ST/SR on telemetry. Skin dry with no open wounds. Bp elevated. Patient is anxious. CHF careplan initiated. Low fall risk.  Patient oriented to room and plan of care.

## 2017-04-20 DIAGNOSIS — R0602 Shortness of breath: Secondary | ICD-10-CM

## 2017-04-20 DIAGNOSIS — Z515 Encounter for palliative care: Secondary | ICD-10-CM

## 2017-04-20 DIAGNOSIS — Z7189 Other specified counseling: Secondary | ICD-10-CM

## 2017-04-20 DIAGNOSIS — I5023 Acute on chronic systolic (congestive) heart failure: Secondary | ICD-10-CM

## 2017-04-20 LAB — BASIC METABOLIC PANEL
Anion gap: 8 (ref 5–15)
BUN: 21 mg/dL — AB (ref 6–20)
CHLORIDE: 97 mmol/L — AB (ref 101–111)
CO2: 33 mmol/L — AB (ref 22–32)
Calcium: 9.1 mg/dL (ref 8.9–10.3)
Creatinine, Ser: 1.88 mg/dL — ABNORMAL HIGH (ref 0.61–1.24)
GFR calc Af Amer: 42 mL/min — ABNORMAL LOW (ref >60)
GFR, EST NON AFRICAN AMERICAN: 36 mL/min — AB (ref >60)
GLUCOSE: 67 mg/dL (ref 65–99)
POTASSIUM: 3.9 mmol/L (ref 3.5–5.1)
Sodium: 138 mmol/L (ref 135–145)

## 2017-04-20 LAB — MAGNESIUM: Magnesium: 1.7 mg/dL (ref 1.7–2.4)

## 2017-04-20 MED ORDER — ISOSORB DINITRATE-HYDRALAZINE 20-37.5 MG PO TABS: 1.0000 | ORAL_TABLET | Freq: Three times a day (TID) | ORAL | Status: DC

## 2017-04-20 MED ORDER — SPIRONOLACTONE 25 MG PO TABS
25.0000 mg | ORAL_TABLET | Freq: Every day | ORAL | Status: DC
  Administered 2017-04-20 – 2017-04-23 (×4): 25 mg via ORAL
  Filled 2017-04-20 (×4): qty 1

## 2017-04-20 MED ORDER — ISOSORB DINITRATE-HYDRALAZINE 20-37.5 MG PO TABS
1.0000 | ORAL_TABLET | Freq: Three times a day (TID) | ORAL | Status: DC
  Administered 2017-04-20 – 2017-04-21 (×3): 1 via ORAL
  Filled 2017-04-20 (×3): qty 1

## 2017-04-20 MED ORDER — MAGNESIUM SULFATE 2 GM/50ML IV SOLN
2.0000 g | Freq: Once | INTRAVENOUS | Status: AC
  Administered 2017-04-20: 2 g via INTRAVENOUS
  Filled 2017-04-20 (×2): qty 50

## 2017-04-20 NOTE — Evaluation (Signed)
Physical Therapy Evaluation Patient Details Name: Gerald Nelson MRN: [id number] DOB: 1950-07-30 Today's Date: 04/20/2017   History of Present Illness  Gerald Nelson is a 66 y.o. male Patient presents to the ED for SOB.  Symptoms have been intermittently ongoing for past 6 months, worse over past 2 days.  Worse with exertion or laying flat. Pt previously admitted for similar complaints however left AMA 04/06/17. Pt with a history of CHF (Echo 01/2017 EF 15%), pulmonary HTN, CVA, schizophrenia, CKD stage III.  Clinical Impression  Pt admitted with above diagnosis. Pt currently with functional limitations due to the deficits listed below (see PT Problem List). Pt mobility is limited by DoE with ambulation of greater distance. Pt currently mod I for bed mobility, and transfers and min guard for ambulation of 350 feet without assistive device. Pt will benefit from skilled PT to increase their independence and safety with mobility to allow discharge to the venue listed below.       Follow Up Recommendations No PT follow up    Equipment Recommendations  None recommended by PT    Recommendations for Other Services       Precautions / Restrictions Precautions Precautions: Fall Restrictions Weight Bearing Restrictions: No      Mobility  Bed Mobility Overal bed mobility: Modified Independent                Transfers Overall transfer level: Modified independent Equipment used: None             General transfer comment: good power up and steadying in standing  Ambulation/Gait Ambulation/Gait assistance: Min guard Ambulation Distance (Feet): 350 Feet Assistive device: None Gait Pattern/deviations: Step-through pattern;Drifts right/left;Decreased step length - right;Decreased step length - left Gait velocity: average Gait velocity interpretation: >2.62 ft/sec, indicative of independent community ambulator General Gait Details: hands on min guard for safety, refused RW,  occasional scissor step which pt is able to self correct without assist, drifts right and left with gait, vc for looking up and out not down at his feet       Balance Overall balance assessment: Needs assistance Sitting-balance support: No upper extremity supported;Feet supported Sitting balance-Leahy Scale: Good     Standing balance support: No upper extremity supported Standing balance-Leahy Scale: Good                               Pertinent Vitals/Pain Pain Assessment: No/denies pain    Home Living Family/patient expects to be discharged to:: Private residence Living Arrangements: Group Home Available Help at Discharge: Available PRN/intermittently;Other (Comment) (group home owner) Type of Home: Group Home Home Access: Stairs to enter Entrance Stairs-Rails: None Entrance Stairs-Number of Steps: 3 Home Layout: One level Home Equipment: None      Prior Function Level of Independence: Independent (takes cab to get groceries, independent with ADLs )               Extremity/Trunk ssessment   Upper Extremity Assessment Upper Extremity Assessment: Generalized weakness    Lower Extremity Assessment Lower Extremity Assessment: Generalized weakness       Communication   Communication: No difficulties  Cognition Arousal/Alertness: Awake/alert Behavior During Therapy: Flat affect Overall Cognitive Status: Within Functional Limits for tasks assessed  General Comments General comments (skin integrity, edema, etc.): HR reached 123 bpm with gait with 3/4 DoE, requiring one standing rest break to recover to 100 bpm         Assessment/Plan    PT Assessment Patient needs continued PT services  PT Problem List Decreased balance;Decreased safety awareness;Cardiopulmonary status limiting activity       PT Treatment Interventions DME instruction;Gait training;Stair training;Functional mobility  training;Therapeutic activities;Therapeutic exercise;Balance training;Patient/family education    PT Goals (Current goals can be found in the Care Plan section)  Acute Rehab PT Goals Patient Stated Goal: stop coughing PT Goal Formulation: With patient Time For Goal Achievement: 05/04/17 Potential to Achieve Goals: Good    Frequency Min 3X/week   Barriers to discharge        Co-evaluation               AM-PAC PT "6 Clicks" Daily Activity  Outcome Measure Difficulty turning over in bed (including adjusting bedclothes, sheets and blankets)?: None Difficulty moving from lying on back to sitting on the side of the bed? : None Difficulty sitting down on and standing up from a chair with arms (e.g., wheelchair, bedside commode, etc,.)?: None Help needed moving to and from a bed to chair (including a wheelchair)?: None Help needed walking in hospital room?: None Help needed climbing 3-5 steps with a railing? : A Little 6 Click Score: 23    End of Session Equipment Utilized During Treatment: Gait belt Activity Tolerance: Patient tolerated treatment well Patient left: in chair;with call bell/phone within reach;with chair alarm set;with nursing/sitter in room Nurse Communication: Mobility status PT Visit Diagnosis: Unsteadiness on feet (R26.81);Other abnormalities of gait and mobility (R26.89);Muscle weakness (generalized) (M62.81);Difficulty in walking, not elsewhere classified (R26.2)    Time: 4166-0630 PT Time Calculation (min) (ACUTE ONLY): 21 min   Charges:   PT Evaluation $PT Eval Low Complexity: 1 Low     PT G Codes:        Namish Krise B. Migdalia Dk PT, DPT Acute Rehabilitation  [phone] Pager [phone]    [place] 04/20/2017, 9:59 AM

## 2017-04-20 NOTE — Consult Note (Signed)
Consultation Note Date: 04/20/2017   Patient Name: Gerald Nelson  DOB: [birth year]  MRN: [id number]  Age / Sex: 66 y.o., male  PCP: [name], MD Referring Physician: Minus Breeding, MD  Reason for Consultation: Establishing goals of care and Psychosocial/spiritual support  HPI/Patient Profile: 66 y.o. male  admitted on 04/18/2017 with  Past medical history significant for acute on chronic systolic heart failure, left ventricular dysfunction with an EF of 15%, moderate LVH, severe MR  He has had multiple re hospitalizations in the past several months and unfortunately he has left the hospital a and a in the past.  He has dense psychosocial issues,  he lives in a boarding house, limited social support.  Compliance with medication and diet is questionable.  He is currently refusing any further cardiac workup.  However I did speak with his psychiatrist's office Dr. Wylene Simmer today and he is compliant with his monthly injectable Haldol/ Decanoate/ 100mg  per ml.  With the patient's complex medical history issues concerning advance care planning and anticipatory care needs will continue to surface.   Clinical Assessment and Goals of Care:  This NP Wadie Lessen reviewed medical records, received report from team, assessed the patient and then meet at the patient's bedside   to discuss diagnosis, prognosis, GOC, and options.  A  discussion was had today regarding advanced directives.  Concepts specific to code status, artifical feeding and hydration, continued IV antibiotics and rehospitalization was had.  The difference between a aggressive medical intervention path  and a palliative comfort care path for this patient at this time was had.  Values and goals of care important to patient and family were attempted to be elicited.   Concept of Hospice and Palliative Care were discussed. Patient verbalized skepticism  when discussing hospice benefit.    Questions and concerns addressed.   Family encouraged to call with questions or concerns.  PMT will continue to support holistically.   PATIENT-no documented healthcare power of attorney.    SUMMARY OF RECOMMENDATIONS    Code Status/Advance Care Planning:  Full code-encouraged to consider DNR/DNI status knowing poor outcomes in similar patients, especially since the patient is not interested in cardiac workup.    Psycho-social/Spiritual:  Discussed with Hassan Rowan case manager RN possibilities of getting more support for this patient when he discharges.  He refuses hospice services.    Desire for further Chaplaincy support:yes  Additional Recommendations: Education on Hospice  Prognosis:   < 6 months  Discharge Planning: To Be Determined      Primary Diagnoses: Present on Admission: . Acute on chronic systolic (congestive) heart failure ([place])   I have reviewed the medical record, interviewed the patient and family, and examined the patient. The following aspects are pertinent.  Past Medical History:  Diagnosis Date  . Arthritis   . CHF (congestive heart failure) ([place])   . COPD (chronic obstructive pulmonary disease) ([place])   . Hernia, hiatal   . Hypertension   . Schizophrenia [facility])    Social History   Social  History  . Marital status: Single    Spouse name: N/A  . Number of children: N/A  . Years of education: N/A   Social History Main Topics  . Smoking status: Current Every Day Smoker    Packs/day: 1.50    Types: Cigarettes  . Smokeless tobacco: Never Used  . Alcohol use No  . Drug use: No  . Sexual activity: Not Asked   Other Topics Concern  . None   Social History Narrative  . None   Family History  Problem Relation Age of Onset  . Hypertension Mother   . Hypertension Father    Scheduled Meds: . aspirin EC  81 mg Oral Daily  . furosemide  80 mg Intravenous TID  . heparin  5,000 Units Subcutaneous Q8H  .  hydrALAZINE  25 mg Oral Q8H  . ipratropium-albuterol  3 mL Nebulization Q6H  . losartan  25 mg Oral Daily  . nicotine  21 mg Transdermal Daily  . potassium chloride  40 mEq Oral BID  . sodium chloride flush  3 mL Intravenous Q12H  . spironolactone  25 mg Oral Daily   Continuous Infusions: . sodium chloride     PRN Meds:.sodium chloride, acetaminophen, ALPRAZolam, ondansetron (ZOFRAN) IV, sodium chloride flush Medications Prior to Admission:  Prior to Admission medications   Medication Sig Start Date End Date Taking? Authorizing Provider  acetaminophen (TYLENOL) 500 MG tablet Take 1,000 mg by mouth every 6 (six) hours as needed for mild pain.   Yes [provider]  aspirin 325 MG tablet Take 325 mg by mouth daily.   Yes [provider]  cloNIDine (CATAPRES) 0.1 MG tablet Take 0.1 mg by mouth daily.   Yes [provider]  diltiazem (CARDIZEM CD) 240 MG 24 hr capsule Take 240 mg by mouth every evening. 04/11/17  Yes [provider]  furosemide (LASIX) 40 MG tablet Take 40 mg by mouth daily.   Yes [provider]  haloperidol decanoate (HALDOL DECANOATE) 100 MG/ML injection Inject 100 mg into the muscle every 30 (thirty) days.  01/07/17  Yes [provider]  albuterol (PROVENTIL HFA;VENTOLIN HFA) 108 (90 Base) MCG/ACT inhaler Inhale 1-2 puffs into the lungs every 6 (six) hours as needed for wheezing or shortness of breath.    [provider]   Allergies  Allergen Reactions  . Sulfa Antibiotics Nausea Only   Review of Systems  Respiratory: Positive for shortness of breath.     Physical Exam  Constitutional: He is oriented to person, place, and time. He appears well-developed.  Pulmonary/Chest: He has wheezes.  Neurological: He is alert and oriented to person, place, and time.  Skin: Skin is warm and dry.    Vital Signs: BP (!) 152/100 (BP Location: Right Arm)   Pulse 94   Temp 97.6 F (36.4 C) (Oral)   Resp 20   Ht 5'  10" (1.778 m)   Wt 62.1 kg (136 lb 12.8 oz)   SpO2 98%   BMI 19.63 kg/m  Pain Assessment: No/denies pain   Pain Score: 0-No pain   SpO2: SpO2: 98 % O2 Device:SpO2: 98 % O2 Flow Rate: .O2 Flow Rate (L/min): 0 L/min  IO: Intake/output summary:  Intake/Output Summary (Last 24 hours) at 04/20/17 1115 Last data filed at 04/20/17 1059  Gross per 24 hour  Intake              963 ml  Output  8450 ml  Net            -7487 ml    LBM: Last BM Date: 04/19/17 Baseline Weight: Weight: 71.7 kg (158 lb) Most recent weight: Weight: 62.1 kg (136 lb 12.8 oz)     Palliative Assessment/Data: 60%     Time In: 1000 Time Out: 1115 Time Total: 75 min Greater than 50%  of this time was spent counseling and coordinating care related to the above assessment and plan.  Signed by: Wadie Lessen, NP   Please contact Palliative Medicine Team phone at [phone] for questions and concerns.  For individual provider: See Shea Evans

## 2017-04-20 NOTE — Clinical Social Work Note (Signed)
PT evaluated patient today and determined he did not need PT follow up.  CSW signing off. Consult again if any other social work needs arise.  Dayton Scrape, [place]

## 2017-04-20 NOTE — Progress Notes (Signed)
Advanced Heart Failure Rounding Note  Primary Cardiologist: Dr. Percival Spanish HF: Dr. Aundra Dubin   Subjective:    Admitted 04/18/17 with worsening SOB. Of note, left hospital [facility] 04/07/17.  Feeling much better. No more dyspnea at rest or with conversation. Hydralazine added last night for HTN.   Negative 7.2 L and down 20 lbs with lasix 80 mg IV TID and metolazone.   CXR 04/19/17 with small left effusion and pulmonary edema.   Objective:   Weight Range: 136 lb 12.8 oz (62.1 kg) Body mass index is 19.63 kg/m.   Vital Signs:   Temp:  [97.6 F (36.4 C)-98.2 F (36.8 C)] 97.6 F (36.4 C) (10/17 0550) Pulse Rate:  [91-101] 91 (10/17 0550) Resp:  [18-22] 20 (10/17 0550) BP: (129-195)/(78-126) 129/86 (10/17 0550) SpO2:  [96 %-100 %] 100 % (10/17 0550) Weight:  [136 lb 12.8 oz (62.1 kg)] 136 lb 12.8 oz (62.1 kg) (10/17 0550) Last BM Date: 04/19/17  Weight change: Filed Weights   04/18/17 1813 04/19/17 0328 04/20/17 0550  Weight: 158 lb (71.7 kg) 156 lb 9.6 oz (71 kg) 136 lb 12.8 oz (62.1 kg)    Intake/Output:   Intake/Output Summary (Last 24 hours) at 04/20/17 0748 Last data filed at 04/20/17 0554  Gross per 24 hour  Intake              723 ml  Output             7950 ml  Net            -7227 ml      Physical Exam    General: Elderly and chronically ill appearing. No resp difficult. Sitting on EOB. HEENT: Normal Neck: Supple. JVP 9-10 cm. Carotids 2+ bilat; no bruits. No thyromegaly or nodule noted. Cor: PMI nondisplaced. RRR, occasional ectopy, 2/6 HSM Apex Lungs: Diminished with basilar crackles and dull left base sounds. Very mild inspiratory wheeze. Abdomen: Soft, non-tender, non-distended, no HSM. No bruits or masses. +BS  Extremities: No cyanosis, clubbing, or rash. Trace to 1+ chronic edema. Neuro: Alert & orientedx3, cranial nerves grossly intact. moves all 4 extremities w/o difficulty. Affect flat.   Telemetry   NSR 90-100s, Personally reviewed.  Occasional PVCs, + NSVT.   EKG    N/A  Labs    CBC  Recent Labs  04/18/17 2026  WBC 6.8  HGB 14.4  HCT 42.1  MCV 90.0  PLT 630   Basic Metabolic Panel  Recent Labs  04/18/17 2026  04/19/17 1750 04/20/17 0444  NA 132*  < > 136 138  K 4.2  < > 4.8 3.9  CL 97*  < > 99* 97*  CO2 24  < > 22 33*  GLUCOSE 101*  < > 98 67  BUN 15  < > 15 21*  CREATININE 1.56*  < > 1.66* 1.88*  CALCIUM 9.1  < > 9.2 9.1  MG 1.9  --  1.8  --   < > = values in this interval not displayed. Liver Function Tests  Recent Labs  04/18/17 2026  AST 60*  ALT 37  ALKPHOS 85  BILITOT 1.1  PROT 6.8  ALBUMIN 3.2*   No results for input(s): LIPASE, AMYLASE in the last 72 hours. Cardiac Enzymes No results for input(s): CKTOTAL, CKMB, CKMBINDEX, TROPONINI in the last 72 hours.  BNP: BNP (last 3 results)  Recent Labs  01/26/17 2152 04/05/17 2136 04/18/17 2026  BNP 3,641.0* 2,689.3* 2,204.0*    ProBNP (last 3  results) No results for input(s): PROBNP in the last 8760 hours.   D-Dimer No results for input(s): DDIMER in the last 72 hours. Hemoglobin A1C No results for input(s): HGBA1C in the last 72 hours. Fasting Lipid Panel No results for input(s): CHOL, HDL, LDLCALC, TRIG, CHOLHDL, LDLDIRECT in the last 72 hours. Thyroid Function Tests  Recent Labs  04/18/17 2026  TSH 3.458    Other results:   Imaging    No results found.   Medications:     Scheduled Medications: . aspirin EC  81 mg Oral Daily  . furosemide  80 mg Intravenous TID  . heparin  5,000 Units Subcutaneous Q8H  . hydrALAZINE  25 mg Oral Q8H  . ipratropium-albuterol  3 mL Nebulization Q6H  . losartan  25 mg Oral Daily  . nicotine  21 mg Transdermal Daily  . potassium chloride  40 mEq Oral BID  . sodium chloride flush  3 mL Intravenous Q12H  . spironolactone  12.5 mg Oral Daily    Infusions: . sodium chloride      PRN Medications: sodium chloride, acetaminophen, ALPRAZolam, ondansetron (ZOFRAN)  IV, sodium chloride flush    Patient Profile   Gerald Nelson is a 66 y.o. male with a history of CHF (Echo 01/2017 EF 15%), pulmonary HTN, CVA, schizophrenia, CKD stage III.  Admitted 04/18/17 with worsening DOE.   Assessment/Plan   1. Acute on chronic systolic CHF: Echo 01/3219 EF 15%.  - Has not had an ischemic work up due to leaving [place]. Continues to refuse.  - NYHA IIIb-IV symptoms - Remains markedly volume overloaded on exam. - Responded very well to lasix 80 mg IV TID with metolazone. - Continue IV lasix, hold metolazone today with marked diuresis.  - Continue losartan 25 mg daily. Would ideally use Entresto, but non-compliance complicates (would need more regular labwork) - Continue hydralazine 25 mg TID for now. Compliance will be an issue.  - Increase spiro to 25 mg daily.  - Continue digoxin 0.125 mg daily for now. Will need to follow closely with poor compliance.  - No beta blocker with acute decompensation.   2. Questionable apical thrombus:  - poor anticoagulation candidate with non compliance. No change.   3. MR  - Mod/Sev on last echo. Suspect functional. No change.   4. Tobacco abuse - Encouraged cessation.    5. CKD III:  - Relatively stable. Continue to follow with diuresis.   6. SCHIZOPHRENIA:  - Continue current meds.   7. HTN - Clonidine not ideal with compliance and EF.  Will diurese and follow.   8. COPD - Strongly suspect. Mildly inspiratory wheeze on exam. Yesterday breathing with pursed lips.  - Continue duonebs.   Prognosis guarded. Palliative care consulted. Would benefit from SNF but think he will ultimately refuse. He is not a candidate for advanced therapies with non-compliance and refusal or work up.   Continue to diurese today. Increase spiro as above. Ideally would start Bidil, but compliance markedly poor as outpatient.   Length of Stay: 2  Gerald Nelson  04/20/2017, 7:48 AM  Advanced Heart Failure  Team Pager [phone] (M-F; 7a - 4p)  Please contact [facility] for night-coverage after hours (4p -7a ) and weekends on [email or web address]  Patient seen with PA, agree with the above note.   He feels much better.  Weight down, diuresed well.  BP still high.  Creatinine higher at 1.88.  Still some volume overload but improved.  - Will use  Bidil 1 tab tid, can titrate up.  - Hold off on metolazone today, will give Lasix 80 mg IV tid.  Possibly to po tomorrow.   Lungs sound better on exam.  Continue Duonebs.   We discussed cardiac cath.  He seems more open to it, thought before that we were talking about open heart surgery. Will tentatively plan for Friday if creatinine stable.   Gerald Nelson 04/20/2017 1:35 PM

## 2017-04-21 LAB — BASIC METABOLIC PANEL
Anion gap: 11 (ref 5–15)
BUN: 26 mg/dL — AB (ref 6–20)
CO2: 33 mmol/L — ABNORMAL HIGH (ref 22–32)
CREATININE: 1.97 mg/dL — AB (ref 0.61–1.24)
Calcium: 9.2 mg/dL (ref 8.9–10.3)
Chloride: 93 mmol/L — ABNORMAL LOW (ref 101–111)
GFR, EST AFRICAN AMERICAN: 40 mL/min — AB (ref >60)
GFR, EST NON AFRICAN AMERICAN: 34 mL/min — AB (ref >60)
Glucose, Bld: 107 mg/dL — ABNORMAL HIGH (ref 65–99)
Potassium: 4.6 mmol/L (ref 3.5–5.1)
SODIUM: 137 mmol/L (ref 135–145)

## 2017-04-21 LAB — MAGNESIUM: MAGNESIUM: 1.9 mg/dL (ref 1.7–2.4)

## 2017-04-21 MED ORDER — MAGNESIUM SULFATE 2 GM/50ML IV SOLN
2.0000 g | Freq: Once | INTRAVENOUS | Status: AC
  Administered 2017-04-21: 2 g via INTRAVENOUS
  Filled 2017-04-21 (×2): qty 50

## 2017-04-21 MED ORDER — POTASSIUM CHLORIDE CRYS ER 20 MEQ PO TBCR: 40.0000 meq | EXTENDED_RELEASE_TABLET | Freq: Every day | ORAL | Status: DC

## 2017-04-21 MED ORDER — FUROSEMIDE 10 MG/ML IJ SOLN
80.0000 mg | Freq: Two times a day (BID) | INTRAMUSCULAR | Status: DC
  Administered 2017-04-21: 80 mg via INTRAVENOUS
  Filled 2017-04-21: qty 8

## 2017-04-21 MED ORDER — IPRATROPIUM-ALBUTEROL 0.5-2.5 (3) MG/3ML IN SOLN
3.0000 mL | Freq: Three times a day (TID) | RESPIRATORY_TRACT | Status: DC
  Administered 2017-04-21: 3 mL via RESPIRATORY_TRACT
  Filled 2017-04-21 (×2): qty 3

## 2017-04-21 MED ORDER — ISOSORB DINITRATE-HYDRALAZINE 20-37.5 MG PO TABS
2.0000 | ORAL_TABLET | Freq: Three times a day (TID) | ORAL | Status: DC
  Administered 2017-04-21 – 2017-04-23 (×6): 2 via ORAL
  Filled 2017-04-21 (×6): qty 2

## 2017-04-21 NOTE — Progress Notes (Signed)
Heart Failure Navigator Consult Note  Presentation: Gerald Nelson is a 66 y.o.malewho presents for management of acute on chronic systolic HF. He has severe LV dysfunction. EF was 15%. There was moderate LVH. There was moderate to severe MR. He had elevated peak pulmonary pressures. There was also a question of apical thrombus although this was not clearly identified. Unfortunately, he left the hospital AMA. He has left the hospital several times before and has limited therapeutic options because of social and psych issues and compliance. He left on 10/3 and stays at a boarding house and is here with the house owner/caregiver. She says that he continues to have DOE at rest and with minimal exertion. He sleeps in a chair. He has leg edema and obvious dyspnea with mild exertion with cough. He denies chest pain, neck or arm pain. He says he has been taking his meds but per Dr. Percival Spanish at today's office visit - I cannot tell that he had any prescriptions at discharge and I am not sure that he is taking anything. They cannot verify. He eats "pasta and ground beef". I am sure that he is not compliant with salt. He does not drink alcohol but he has a strong addition to cigarettes which is why he leaves the hospital. Of note he was supposed to be taking Lasix tid, hydral/nitrates. Of note his weight on admission was 164 and appeared to be 144 at some point around his leaving but is 154 today.     Past Medical History:  Diagnosis Date  . Arthritis   . CHF (congestive heart failure) ([place])   . COPD (chronic obstructive pulmonary disease) ([place])   . Hernia, hiatal   . Hypertension   . Schizophrenia [facility])     Social History   Social History  . Marital status: Single    Spouse name: N/A  . Number of children: N/A  . Years of education: N/A   Social History Main Topics  . Smoking status: Current Every Day Smoker    Packs/day: 1.50    Types: Cigarettes  . Smokeless tobacco:  Never Used  . Alcohol use No  . Drug use: No  . Sexual activity: Not Asked   Other Topics Concern  . None   Social History Narrative  . None    ECHO:Study Conclusions-01/04/17  - Left ventricle: The cavity size was normal. Wall thickness was   increased in a pattern of moderate LVH. There was severe focal   basal hypertrophy of the septum. Coarse trabeculation at the LV   apex with multiple false tendinae. Does not meet diagnostic   criteria for non-compaction. LV apical smoke noted, but no   thrombus. Systolic function was severely reduced. The estimated   ejection fraction was 15%. Diffuse hypokinesis. The study is not   technically sufficient to allow evaluation of LV diastolic   function. - Aortic valve: Trileaflet. Sclerosis without stenosis. There was   mild regurgitation. - Mitral valve: Mildly thickened leaflets . There was moderate to   severe regurgitation. - Left atrium: Moderately dilated. - Right ventricle: The cavity size was normal. Systolic function   was low normal. - Right atrium: The atrium was mildly dilated. - Atrial septum: No defect or patent foramen ovale was identified. - Tricuspid valve: There was moderate to severe regurgitation. - Pulmonary arteries: Dilated. PA peak pressure: 73 mm Hg (S). - Inferior vena cava: The vessel was dilated. The respirophasic   diameter changes were blunted (< 50%), consistent with  elevated   central venous pressure. - Pericardium, extracardiac: A trivial pericardial effusion was   identified posterior to the heart. Tamponade physiology cannot be   evaluated in the setting of severe pulmonary hypertension.   Clinical correlation is always advised.  Impressions:  - LVEF 15%, severe global hypokinesis, moderate LVH, coarse   trabeculation of the LV apex with numerous false tendinae of the   left ventricle, very stagnant blood flow at the LV apex with   smoke but no obvious LV thrombus - Definity contrast was  given,   again, noting stagnant apical blood flow - this could suggest   recent thrombus and certainly high risk for apical thrombus   formation. Other findings include aortic sclerosis with mild AI,   moderate to severe MR, moderate LAE, mild RAE, moderate to severe   TR, moderate to severe pulmonary hypertension (RVSP 73 mmHg),   dilated IVC, trivial posterior pericardial effusion - tamponade   physiology cannot be excluded given the degree of pulmonary   hypertension.  BNP    Component Value Date/Time   BNP 2,204.0 (H) 04/18/2017 2026    ProBNP No results found for: PROBNP   Education Assessment and Provision:  Detailed education and instructions provided on heart failure disease management including the following:  Signs and symptoms of Heart Failure When to call the physician Importance of daily weights Low sodium diet Fluid restriction Medication management Anticipated future follow-up appointments  Patient education given on each of the above topics.  Patient acknowledges understanding and acceptance of all instructions.  I spoke briefly with [name] regarding his current hospitalization and HF diagnosis.  He tells me that he lives in a boarding house in [place].  He also says that he was taking medications prior to admission yet also says that his landlord bought them for him.  He has a history of HF and has left AMA from hospital in the past.  He does not have a scale at home and I will provide one for his home use through paramedics.  I briefly reviewed the importance of daily weights and when to contact the physician.  He tells me that he cooks and I reviewed a low sodium diet as well as high sodium foods to avoid.  He does say that he is not sure he can afford medications at discharge and  will likely need medication assistance even though he has insurance noted.   He is open to involvement the HF [employer].  I will make that referral and send  appropriate paperwork via secure email.  I will also make referral to the [facility] for ongoing psychosocial needs as he has history of mental illness.  He will follow in the AHF Clinic after discharge.  Education Materials:  "Living Better With Heart Failure" Booklet, Daily Weight Tracker Tool.   High Risk Criteria for Readmission and/or Poor Patient Outcomes:  (Recommend Follow-up with Advanced Heart Failure Clinic)--yes   EF <30%- yes 15%  2 or more admissions in 6 months- Yes -leaves AMA  Difficult social situation- yes lives in boarding house  Demonstrates medication noncompliance-? Denies-however admits that finances are a challenge    Barriers of Care:  Insight, Health Literacy, Knowledge, Compliance  Discharge Planning:  Plans to return to boarding house.  He has a "friend" (landlord) that helps him at times.

## 2017-04-21 NOTE — Progress Notes (Signed)
Advanced Heart Failure Rounding Note  Primary Cardiologist: Dr. Percival Spanish HF: Dr. Aundra Dubin   Subjective:    Admitted 04/18/17 with worsening SOB. Of note, left hospital [facility] 04/07/17.  Continues to diurese with IV lasix. Weight down 28 pounds. Creatinine trending up 1.6>1.8>1.9.  Feeling ok. Denies SOB. Does not want to pursue cath.   CXR 04/19/17 with small left effusion and pulmonary edema.   Objective:   Weight Range: 130 lb 9.6 oz (59.2 kg) Body mass index is 18.74 kg/m.   Vital Signs:   Temp:  [97.4 F (36.3 C)-98.2 F (36.8 C)] 97.4 F (36.3 C) (10/18 0900) Pulse Rate:  [62-109] 62 (10/18 0900) Resp:  [16-20] 18 (10/18 0454) BP: (100-148)/(72-132) 148/132 (10/18 0900) SpO2:  [90 %-100 %] 100 % (10/18 0900) Weight:  [130 lb 9.6 oz (59.2 kg)] 130 lb 9.6 oz (59.2 kg) (10/18 0454) Last BM Date: 04/20/17  Weight change: Filed Weights   04/19/17 0328 04/20/17 0550 04/21/17 0454  Weight: 156 lb 9.6 oz (71 kg) 136 lb 12.8 oz (62.1 kg) 130 lb 9.6 oz (59.2 kg)    Intake/Output:   Intake/Output Summary (Last 24 hours) at 04/21/17 1058 Last data filed at 04/21/17 0848  Gross per 24 hour  Intake             1200 ml  Output             9550 ml  Net            -8350 ml      Physical Exam    General:  Elderly. Well appearing. No resp difficulty HEENT: normal Neck: supple. JVP 8-9 cm. Carotids 2+ bilat; no bruits. No lymphadenopathy or thryomegaly appreciated. Cor: PMI nondisplaced. Regular rate & rhythm. No rubs,  or murmurs. + S3  Lungs: clear on room air.  Abdomen: soft, nontender, nondistended. No hepatosplenomegaly. No bruits or masses. Good bowel sounds. Extremities: no cyanosis, clubbing, rash, edema Neuro: alert & orientedx3, cranial nerves grossly intact. moves all 4 extremities w/o difficulty. Affect pleasant   Telemetry   NSR 90s   EKG    N/A  Labs    CBC  Recent Labs  04/18/17 2026  WBC 6.8  HGB 14.4  HCT 42.1  MCV 90.0  PLT 185    Basic Metabolic Panel  Recent Labs  04/20/17 0444 04/21/17 0417  NA 138 137  K 3.9 4.6  CL 97* 93*  CO2 33* 33*  GLUCOSE 67 107*  BUN 21* 26*  CREATININE 1.88* 1.97*  CALCIUM 9.1 9.2  MG 1.7 1.9   Liver Function Tests  Recent Labs  04/18/17 2026  AST 60*  ALT 37  ALKPHOS 85  BILITOT 1.1  PROT 6.8  ALBUMIN 3.2*   No results for input(s): LIPASE, AMYLASE in the last 72 hours. Cardiac Enzymes No results for input(s): CKTOTAL, CKMB, CKMBINDEX, TROPONINI in the last 72 hours.  BNP: BNP (last 3 results)  Recent Labs  01/26/17 2152 04/05/17 2136 04/18/17 2026  BNP 3,641.0* 2,689.3* 2,204.0*    ProBNP (last 3 results) No results for input(s): PROBNP in the last 8760 hours.   D-Dimer No results for input(s): DDIMER in the last 72 hours. Hemoglobin A1C No results for input(s): HGBA1C in the last 72 hours. Fasting Lipid Panel No results for input(s): CHOL, HDL, LDLCALC, TRIG, CHOLHDL, LDLDIRECT in the last 72 hours. Thyroid Function Tests  Recent Labs  04/18/17 2026  TSH 3.458    Other results:  Imaging    No results found.   Medications:     Scheduled Medications: . aspirin EC  81 mg Oral Daily  . furosemide  80 mg Intravenous TID  . heparin  5,000 Units Subcutaneous Q8H  . ipratropium-albuterol  3 mL Nebulization Q6H  . isosorbide-hydrALAZINE  1 tablet Oral TID  . losartan  25 mg Oral Daily  . nicotine  21 mg Transdermal Daily  . potassium chloride  40 mEq Oral BID  . sodium chloride flush  3 mL Intravenous Q12H  . spironolactone  25 mg Oral Daily    Infusions: . sodium chloride      PRN Medications: sodium chloride, acetaminophen, ALPRAZolam, ondansetron (ZOFRAN) IV, sodium chloride flush    Patient Profile   Gerald Nelson is a 66 y.o. male with a history of CHF (Echo 01/2017 EF 15%), pulmonary HTN, CVA, schizophrenia, CKD stage III.  Admitted 04/18/17 with worsening DOE.   Assessment/Plan   1. Acute on chronic  systolic CHF: Echo 12/9792 EF 15%.  - Has not had an ischemic work up due to leaving [place]. Continues to refuse.  - NYHA IIIb-IV symptoms Volume status improving. Brisk diuresis noted. Continue 80 mg lasix three times a day.  -Increase bidil to 2 tabs three times a day.  -Continue spiro 25 mg daily and losartan 25 mg daily.   -No beta blocker with acute decompensation.   He will not be on spir at d/cdue to compliance issues.   2. Questionable apical thrombus:  - poor anticoagulation candidate with non compliance. No change.   3. MR  - Mod/Sev on last echo. Suspect functional. No change.   4. Tobacco abuse - Encouraged cessation.    5. CKD III:  -Creatinine trending up 1.6>1.8>1.9.   6. SCHIZOPHRENIA:  - Continue current meds.   7. HTN - Clonidine not ideal with compliance and EF.  Will diurese and follow.   8. COPD - Strongly suspect. Mildly inspiratory wheeze on exam. Yesterday breathing with pursed lips.  - Continue duonebs.   Refused heart cath. He is at high risk for readmit due to poor insight. Palliative Care consulted. He is requesting FCB. Refuses Hospice Services.   Refer to Paramedicine.    Length of Stay: 3  Amy Clegg, NP  04/21/2017, 10:58 AM  Advanced Heart Failure Team Pager [phone] (M-F; 7a - 4p)  Please contact [facility] for night-coverage after hours (4p -7a ) and weekends on [email or web address]  Patient seen with NP, agree with the above note.   Volume improving. Very vigorous diuresis yesterday.  Creatinine to 1.9.  - Agree with increasing Bidil and continue spironolactone, losartan.  - Today, he says that he does not want [place] to do the heart cath.  I explained the purpose again to him but he continues to refuse.  - Think we can decrease Lasix to bid, change to po tomorrow.  - If BP still elevated tomorrow, can add low dose Coreg.   Will refer to paramedicine.   Loralie Champagne 04/21/2017 1:50 PM

## 2017-04-21 NOTE — Progress Notes (Signed)
Patient ID: Gerald Nelson, male   DOB: [birth year], 66 y.o.   MRN: [id number]  This NP visited patient at the bedside as a follow up to  yesterday's [place].  Patient verbalizes no questions or concerns however he continues to reiterate that he is not interested and cardiac workup.  He is high risk for decompensation and has little insight into his complex medical situation.  We discussed patient choice and consequences. I believe he is hospice eligible but he refuses hospice services at this time.  Hopefully case management and social work can investigate more services in the home.  Questions and concerns addressed,    palliative medicine team will shadow for needs.  Time in 0900          Time out    0915  total time spent with the patient was 15 minutes  Discussed with Dr Aundra Dubin  Greater than 50% of the time was spent in counseling and coordination of care  Wadie Lessen NP  Palliative Medicine Team Team Phone # [phone] Pager [phone]

## 2017-04-21 NOTE — Progress Notes (Signed)
Responded to consult for this patient transitioning to palliative care.  Patient is stating that he has been talked to about having a procedure or some type of catheter and he has so far decided not to have the procedure.  Patient said he may change his mind but right now doesn't want to have any type of surgery.  He said the doctor also talked to him about dying well.  I asked if he is ready to die, he said that his mother used to tell him that people that didn't die avoided a catastrophe.  Chaplain talked with him about forgiveness and family.  Patient said that he may change his mind and have the procedure.  I encouraged him to ask as many questions as possible so that he could make the best decision possible for him.  Chaplain happy to return as often as needed for this patient.  Thanks to the medical team for the care given to him.    04/21/17 1349  Clinical Encounter Type  Visited With Patient  Visit Type Initial;Psychological support;Spiritual support

## 2017-04-22 LAB — BASIC METABOLIC PANEL
ANION GAP: 10 (ref 5–15)
BUN: 29 mg/dL — ABNORMAL HIGH (ref 6–20)
CHLORIDE: 92 mmol/L — AB (ref 101–111)
CO2: 28 mmol/L (ref 22–32)
Calcium: 8.9 mg/dL (ref 8.9–10.3)
Creatinine, Ser: 2.09 mg/dL — ABNORMAL HIGH (ref 0.61–1.24)
GFR, EST AFRICAN AMERICAN: 37 mL/min — AB (ref >60)
GFR, EST NON AFRICAN AMERICAN: 32 mL/min — AB (ref >60)
Glucose, Bld: 90 mg/dL (ref 65–99)
POTASSIUM: 4.3 mmol/L (ref 3.5–5.1)
SODIUM: 130 mmol/L — AB (ref 135–145)

## 2017-04-22 MED ORDER — LOSARTAN POTASSIUM 25 MG PO TABS
25.0000 mg | ORAL_TABLET | Freq: Every day | ORAL | Status: DC
  Administered 2017-04-23: 25 mg via ORAL
  Filled 2017-04-22: qty 1

## 2017-04-22 MED ORDER — FUROSEMIDE 40 MG PO TABS
40.0000 mg | ORAL_TABLET | Freq: Two times a day (BID) | ORAL | Status: DC
  Administered 2017-04-22 – 2017-04-23 (×3): 40 mg via ORAL
  Filled 2017-04-22 (×2): qty 1

## 2017-04-22 MED ORDER — CARVEDILOL 3.125 MG PO TABS
3.1250 mg | ORAL_TABLET | Freq: Two times a day (BID) | ORAL | Status: DC
  Administered 2017-04-22 – 2017-04-23 (×3): 3.125 mg via ORAL
  Filled 2017-04-22 (×2): qty 1

## 2017-04-22 MED ORDER — IPRATROPIUM-ALBUTEROL 0.5-2.5 (3) MG/3ML IN SOLN
3.0000 mL | Freq: Four times a day (QID) | RESPIRATORY_TRACT | Status: DC | PRN
  Administered 2017-04-22: 3 mL via RESPIRATORY_TRACT
  Filled 2017-04-22: qty 3

## 2017-04-22 MED ORDER — FUROSEMIDE 40 MG PO TABS
40.0000 mg | ORAL_TABLET | Freq: Two times a day (BID) | ORAL | Status: DC
  Filled 2017-04-22: qty 1

## 2017-04-22 MED ORDER — POTASSIUM CHLORIDE CRYS ER 20 MEQ PO TBCR
20.0000 meq | EXTENDED_RELEASE_TABLET | Freq: Every day | ORAL | Status: DC
  Administered 2017-04-22 – 2017-04-23 (×2): 20 meq via ORAL
  Filled 2017-04-22 (×2): qty 1

## 2017-04-22 NOTE — Care Management Important Message (Signed)
Important Message  Patient Details  Name: Gerald Nelson MRN: [id number] Date of Birth: 1950-11-06   Medicare Important Message Given:  Yes    Orbie Pyo 04/22/2017, 2:29 PM

## 2017-04-22 NOTE — Progress Notes (Signed)
Advanced Heart Failure Rounding Note  Primary Cardiologist: Dr. Percival Spanish HF: Dr. Aundra Dubin   Subjective:    Admitted 04/18/17 with worsening SOB. Of note, left hospital [facility] 04/07/17.  Feeling better. Walking halls without SOB. No orthopnea. No lightheadedness or dizziness.   Negative 1.7 L net. Negative 25 lbs total. Creatinine trending up to 2.09.    CXR 04/19/17 with small left effusion and pulmonary edema.   Objective:   Weight Range: 133 lb (60.3 kg) Body mass index is 19.08 kg/m.   Vital Signs:   Temp:  [97.4 F (36.3 C)-98.9 F (37.2 C)] 98.5 F (36.9 C) (10/19 0531) Pulse Rate:  [62-97] 97 (10/19 0531) Resp:  [16-20] 20 (10/19 0531) BP: (101-148)/(64-132) 125/91 (10/19 0531) SpO2:  [98 %-100 %] 99 % (10/19 0531) Weight:  [133 lb (60.3 kg)] 133 lb (60.3 kg) (10/19 0531) Last BM Date: 04/21/17  Weight change: Filed Weights   04/20/17 0550 04/21/17 0454 04/22/17 0531  Weight: 136 lb 12.8 oz (62.1 kg) 130 lb 9.6 oz (59.2 kg) 133 lb (60.3 kg)    Intake/Output:   Intake/Output Summary (Last 24 hours) at 04/22/17 0827 Last data filed at 04/22/17 0823  Gross per 24 hour  Intake             1610 ml  Output             3775 ml  Net            -2165 ml      Physical Exam   General: Elderly appearing. No resp difficulty. HEENT: Normal Neck: Supple. JVP 6-7 cm.. Carotids 2+ bilat; no bruits. No thyromegaly or nodule noted. Cor: PMI nondisplaced. RRR, +S3. Lungs: Mildly diminished breath sounds bilaterally. Abdomen: Soft, non-tender, non-distended, no HSM. No bruits or masses. +BS  Extremities: No cyanosis, clubbing, or rash. Trace ankle edema.  Neuro: Alert & orientedx3, cranial nerves grossly intact. moves all 4 extremities w/o difficulty. Affect pleasant    Telemetry   NSR 90s, personally reviewed.  EKG    N/A  Labs    CBC No results for input(s): WBC, NEUTROABS, HGB, HCT, MCV, PLT in the last 72 hours. Basic Metabolic Panel  Recent Labs  04/20/17 0444 04/21/17 0417 04/22/17 0544  NA 138 137 130*  K 3.9 4.6 4.3  CL 97* 93* 92*  CO2 33* 33* 28  GLUCOSE 67 107* 90  BUN 21* 26* 29*  CREATININE 1.88* 1.97* 2.09*  CALCIUM 9.1 9.2 8.9  MG 1.7 1.9  --    Liver Function Tests No results for input(s): AST, ALT, ALKPHOS, BILITOT, PROT, ALBUMIN in the last 72 hours. No results for input(s): LIPASE, AMYLASE in the last 72 hours. Cardiac Enzymes No results for input(s): CKTOTAL, CKMB, CKMBINDEX, TROPONINI in the last 72 hours.  BNP: BNP (last 3 results)  Recent Labs  01/26/17 2152 04/05/17 2136 04/18/17 2026  BNP 3,641.0* 2,689.3* 2,204.0*    ProBNP (last 3 results) No results for input(s): PROBNP in the last 8760 hours.   D-Dimer No results for input(s): DDIMER in the last 72 hours. Hemoglobin A1C No results for input(s): HGBA1C in the last 72 hours. Fasting Lipid Panel No results for input(s): CHOL, HDL, LDLCALC, TRIG, CHOLHDL, LDLDIRECT in the last 72 hours. Thyroid Function Tests No results for input(s): TSH, T4TOTAL, T3FREE, THYROIDAB in the last 72 hours.  Invalid input(s): FREET3  Other results:   Imaging    No results found.   Medications:  Scheduled Medications: . aspirin EC  81 mg Oral Daily  . furosemide  80 mg Intravenous BID  . heparin  5,000 Units Subcutaneous Q8H  . ipratropium-albuterol  3 mL Nebulization TID  . isosorbide-hydrALAZINE  2 tablet Oral TID  . losartan  25 mg Oral Daily  . nicotine  21 mg Transdermal Daily  . potassium chloride  40 mEq Oral Daily  . sodium chloride flush  3 mL Intravenous Q12H  . spironolactone  25 mg Oral Daily    Infusions: . sodium chloride      PRN Medications: sodium chloride, acetaminophen, ALPRAZolam, ondansetron (ZOFRAN) IV, sodium chloride flush    Patient Profile   Gerald Nelson is a 66 y.o. male with a history of CHF (Echo 01/2017 EF 15%), pulmonary HTN, CVA, schizophrenia, CKD stage III.  Admitted 04/18/17 with worsening  DOE.   Assessment/Plan   1. Acute on chronic systolic CHF: Echo 09/158 EF 15%.  - Has not had an ischemic work up due to leaving [place]. Continues to refuse cath.  - NYHA IIII symptoms now.  - Volume status much improved with IV diuresis. - Stop IV lasix. Transition to Lasix 40 mg po BID, can get 1st dose this evening.   - Start coreg 3.125 mg BID now that volume much improved.  - Continue bidil 2 tabs TID.  - Continue spiro 25 mg daily.   - Hold losartan today with rise in creatinine, can restart tomorrow.   2. Questionable apical thrombus:  - poor anticoagulation candidate with non compliance. No change.   3. MR  - Mod/Sev on last echo. Suspect functional. No change.    4. Tobacco abuse - Encouraged cessation.  No change.  5. CKD III:  -Creatinine trending up 1.6>1.8>1.9>2.09. Stopping IV Lasix.   6. SCHIZOPHRENIA:  - Continue current meds. No change.   7. HTN - Clonidine not ideal with compliance and EF.   - Improved with med adjustment.   8. COPD - Strongly suspect. Mildly inspiratory wheeze on exam. Yesterday breathing with pursed lips.  - Continue duonebs, can change to prn.  - Needs to quit smoking but do not think he is interested.   Refused heart cath. He is at high risk for readmit due to poor insight. Palliative Care consulted. He is requesting FCB. Refuses Hospice Services.   He has been referred to paramedicine.   Length of Stay: [address]  Annamaria Helling  04/22/2017, 8:27 AM  Advanced Heart Failure Team Pager [phone] (M-F; 7a - 4p)  Please contact [facility] for night-coverage after hours (4p -7a ) and weekends on [email or web address]   Patient seen with PA, agree with the above note.    He looks euvolemic now, has diuresed well.  Transition to po Lasix, will not start until this evening with creatinine rise.  Hold losartan today and restart tomorrow with mild uptrend in creatinine.  With stable volume, think we can start him on Coreg 3.125  mg bid.   Suspicion for ischemic cardiomyopathy but he again refuses evaluation by cardiac cath.  He is on ASA 81.  Check lipids in am and recommend statin with dosing based on numbers.    He remains full code after discussions with palliative care service.   I talked with his caregiver today.  She is going to try to help him organize his medications and stay on them.  He has actually been very compliant with visits to his psychiatrist for schizophrenia.  We will get him  in the paramedicine program as well and try to arrange home health.  Would plan on discharge tomorrow to make sure he tolerates current po regimen.  We have CHF clinic followup arranged for next Friday.   Ideal med regimen for home (will need help from caregiver and paramedicine to follow this regimen):  ASA 81 Statin based on lipids tomorrow Coreg 3.125 mg bid Losartan 25 daily Bidil 2 tabs tid Spironolactone 25 daily Lasix 40 mg po bid.  KCl 20 mEq daily Nicotine patch if he is willing to try to stay off cigarettes  Loralie Champagne 04/22/2017 8:51 AM

## 2017-04-22 NOTE — Progress Notes (Signed)
Physical Therapy Treatment Patient Details Name: Gerald Nelson MRN: [id number] DOB: 09-08-1950 Today's Date: 04/22/2017    History of Present Illness ABDUL BEIRNE is a 66 y.o. male Patient presents to the ED for SOB.  Symptoms have been intermittently ongoing for past 6 months, worse over past 2 days.  Worse with exertion or laying flat. Pt previously admitted for similar complaints however left AMA 04/06/17. Pt with a history of CHF (Echo 01/2017 EF 15%), pulmonary HTN, CVA, schizophrenia, CKD stage III.    PT Comments    Patient continues to progress with mobility and experienced 1/4 DOE while ambulating. Vitals WNL throughout session. Current plan remains appropriate.    Follow Up Recommendations  No PT follow up     Equipment Recommendations  None recommended by PT    Recommendations for Other Services       Precautions / Restrictions Precautions Precautions: Fall Restrictions Weight Bearing Restrictions: No    Mobility  Bed Mobility Overal bed mobility: Independent                Transfers Overall transfer level: Modified independent Equipment used: None             General transfer comment: no unsteadiness noted; increased effort  Ambulation/Gait Ambulation/Gait assistance: Min guard;Supervision Ambulation Distance (Feet): 400 Feet Assistive device: None Gait Pattern/deviations: Step-through pattern;Drifts right/left;Decreased step length - right;Decreased step length - left;Narrow base of support   Gait velocity interpretation: at or above normal speed for age/gender General Gait Details: cues for breathing technique and bilat step length; pt tolerated high level balance activities without significant gait deviations; pt with 1/4 DOE with mobility   Stairs            Wheelchair Mobility    Modified Rankin (Stroke Patients Only)       Balance Overall balance assessment: Needs assistance Sitting-balance support: No upper extremity  supported;Feet supported Sitting balance-Leahy Scale: Good     Standing balance support: No upper extremity supported Standing balance-Leahy Scale: Good                              Cognition Arousal/Alertness: Awake/alert Behavior During Therapy: Flat affect Overall Cognitive Status: Within Functional Limits for tasks assessed                                        Exercises      General Comments General comments (skin integrity, edema, etc.): vitals WNL during session      Pertinent Vitals/Pain Pain Assessment: No/denies pain    Home Living                      Prior Function            PT Goals (current goals can now be found in the care plan section) Acute Rehab PT Goals PT Goal Formulation: With patient Time For Goal Achievement: 05/04/17 Potential to Achieve Goals: Good Progress towards PT goals: Progressing toward goals    Frequency    Min 3X/week      PT Plan Current plan remains appropriate    Co-evaluation              AM-PAC PT "6 Clicks" Daily Activity  Outcome Measure  Difficulty turning over in bed (including adjusting bedclothes, sheets and blankets)?: None Difficulty  moving from lying on back to sitting on the side of the bed? : None Difficulty sitting down on and standing up from a chair with arms (e.g., wheelchair, bedside commode, etc,.)?: None Help needed moving to and from a bed to chair (including a wheelchair)?: None Help needed walking in hospital room?: None Help needed climbing 3-5 steps with a railing? : A Little 6 Click Score: 23    End of Session Equipment Utilized During Treatment: Gait belt Activity Tolerance: Patient tolerated treatment well Patient left: with call bell/phone within reach;in bed Nurse Communication: Mobility status PT Visit Diagnosis: Unsteadiness on feet (R26.81);Other abnormalities of gait and mobility (R26.89);Muscle weakness (generalized)  (M62.81);Difficulty in walking, not elsewhere classified (R26.2)     Time: 8828-0034 PT Time Calculation (min) (ACUTE ONLY): 16 min  Charges:  $Gait Training: 8-22 mins                    G Codes:       Earney Navy, PTA Pager: [phone]     Darliss Cheney 04/22/2017, 4:40 PM

## 2017-04-23 ENCOUNTER — Encounter (HOSPITAL_COMMUNITY): Payer: Self-pay | Admitting: Physician Assistant

## 2017-04-23 DIAGNOSIS — I272 Pulmonary hypertension, unspecified: Secondary | ICD-10-CM | POA: Diagnosis present

## 2017-04-23 DIAGNOSIS — I8290 Acute embolism and thrombosis of unspecified vein: Secondary | ICD-10-CM

## 2017-04-23 DIAGNOSIS — I340 Nonrheumatic mitral (valve) insufficiency: Secondary | ICD-10-CM

## 2017-04-23 DIAGNOSIS — E785 Hyperlipidemia, unspecified: Secondary | ICD-10-CM

## 2017-04-23 DIAGNOSIS — Z91199 Patient's noncompliance with other medical treatment and regimen due to unspecified reason: Secondary | ICD-10-CM

## 2017-04-23 DIAGNOSIS — Z9119 Patient's noncompliance with other medical treatment and regimen: Secondary | ICD-10-CM

## 2017-04-23 LAB — BASIC METABOLIC PANEL
ANION GAP: 8 (ref 5–15)
BUN: 29 mg/dL — ABNORMAL HIGH (ref 6–20)
CALCIUM: 8.6 mg/dL — AB (ref 8.9–10.3)
CO2: 28 mmol/L (ref 22–32)
Chloride: 92 mmol/L — ABNORMAL LOW (ref 101–111)
Creatinine, Ser: 2.09 mg/dL — ABNORMAL HIGH (ref 0.61–1.24)
GFR, EST AFRICAN AMERICAN: 37 mL/min — AB (ref >60)
GFR, EST NON AFRICAN AMERICAN: 32 mL/min — AB (ref >60)
Glucose, Bld: 110 mg/dL — ABNORMAL HIGH (ref 65–99)
POTASSIUM: 4.2 mmol/L (ref 3.5–5.1)
Sodium: 128 mmol/L — ABNORMAL LOW (ref 135–145)

## 2017-04-23 LAB — LIPID PANEL
CHOL/HDL RATIO: 2.4 ratio
CHOLESTEROL: 207 mg/dL — AB (ref 0–200)
HDL: 86 mg/dL (ref >40)
LDL Cholesterol: 110 mg/dL — ABNORMAL HIGH (ref 0–99)
TRIGLYCERIDES: 54 mg/dL (ref <150)
VLDL: 11 mg/dL (ref 0–40)

## 2017-04-23 MED ORDER — FUROSEMIDE 40 MG PO TABS: 40.0000 mg | ORAL_TABLET | Freq: Two times a day (BID) | ORAL | 1 refills | Status: DC

## 2017-04-23 MED ORDER — LIVING BETTER WITH HEART FAILURE BOOK
Freq: Once | Status: AC
  Administered 2017-04-23: 13:00:00

## 2017-04-23 MED ORDER — ATORVASTATIN CALCIUM 40 MG PO TABS
40.0000 mg | ORAL_TABLET | Freq: Every evening | ORAL | 1 refills | Status: DC
Start: 2017-04-23 — End: 2017-06-13

## 2017-04-23 MED ORDER — NICOTINE 21 MG/24HR TD PT24: MEDICATED_PATCH | TRANSDERMAL | 0 refills | Status: DC

## 2017-04-23 MED ORDER — ISOSORB DINITRATE-HYDRALAZINE 20-37.5 MG PO TABS: 2.0000 | ORAL_TABLET | Freq: Three times a day (TID) | ORAL | 1 refills | Status: DC

## 2017-04-23 MED ORDER — ASPIRIN 81 MG PO TABS: 81.0000 mg | ORAL_TABLET | Freq: Every day | ORAL | 1 refills | Status: DC

## 2017-04-23 MED ORDER — LOSARTAN POTASSIUM 25 MG PO TABS: 25.0000 mg | ORAL_TABLET | Freq: Every day | ORAL | 1 refills | Status: DC

## 2017-04-23 MED ORDER — ATORVASTATIN CALCIUM 40 MG PO TABS: 40.0000 mg | ORAL_TABLET | Freq: Every day | ORAL | Status: DC

## 2017-04-23 MED ORDER — POTASSIUM CHLORIDE CRYS ER 20 MEQ PO TBCR: 20.0000 meq | EXTENDED_RELEASE_TABLET | Freq: Every day | ORAL | 1 refills | Status: DC

## 2017-04-23 MED ORDER — SPIRONOLACTONE 25 MG PO TABS: 25.0000 mg | ORAL_TABLET | Freq: Every day | ORAL | 0 refills | Status: DC

## 2017-04-23 MED ORDER — CARVEDILOL 3.125 MG PO TABS: 3.1250 mg | ORAL_TABLET | Freq: Two times a day (BID) | ORAL | 1 refills | Status: DC

## 2017-04-23 NOTE — Discharge Summary (Signed)
Pt got discharged, discharge instructions provided and patient showed understanding to it, IV taken out,Telemonitor DC,pt left unit via ambulation (refused wheelchair)with all of the belongings. He said that his landlady is coming to pick him up. Living better with heart Failure booklet is provided to the patient

## 2017-04-23 NOTE — Progress Notes (Signed)
Primary Cardiologist: Dr. Percival Spanish HF: Dr. Aundra Dubin   Subjective:    Admitted 04/18/17 with worsening SOB. Of note, left hospital [facility] 04/07/17.  Pt denies dyspnea or chest pain  Negative 1.39 L net.   Objective:   Weight Range: 61.8 kg (136 lb 3.2 oz) Body mass index is 19.54 kg/m.   Vital Signs:   Temp:  [97.7 F (36.5 C)-98.1 F (36.7 C)] 98 F (36.7 C) (10/20 0451) Pulse Rate:  [82-100] 82 (10/20 0557) Resp:  [18-27] 18 (10/20 0451) BP: (104-124)/(70-85) 124/81 (10/20 0557) SpO2:  [95 %-99 %] 96 % (10/20 0451) Weight:  [61.8 kg (136 lb 3.2 oz)] 61.8 kg (136 lb 3.2 oz) (10/20 0451) Last BM Date: 04/21/17  Weight change: Filed Weights   04/21/17 0454 04/22/17 0531 04/23/17 0451  Weight: 59.2 kg (130 lb 9.6 oz) 60.3 kg (133 lb) 61.8 kg (136 lb 3.2 oz)    Intake/Output:   Intake/Output Summary (Last 24 hours) at 04/23/17 1104 Last data filed at 04/23/17 0827  Gross per 24 hour  Intake             1060 ml  Output             1930 ml  Net             -870 ml      Physical Exam   General: Elderly appearing. Chronically ill appearing HEENT: Normal, normal eyelids Neck: No JVD Cor: RRR Lungs: CTA Abdomen: Soft, NT/ND Extremities: No edema.  Neuro: Grossly intact   Telemetry   NSR rare PVC, personally reviewed.  Basic Metabolic Panel  Recent Labs  04/21/17 0417 04/22/17 0544 04/23/17 0437  NA 137 130* 128*  K 4.6 4.3 4.2  CL 93* 92* 92*  CO2 33* 28 28  GLUCOSE 107* 90 110*  BUN 26* 29* 29*  CREATININE 1.97* 2.09* 2.09*  CALCIUM 9.2 8.9 8.6*  MG 1.9  --   --     BNP: BNP (last 3 results)  Recent Labs  01/26/17 2152 04/05/17 2136 04/18/17 2026  BNP 3,641.0* 2,689.3* 2,204.0*    Fasting Lipid Panel  Recent Labs  04/23/17 0437  CHOL 207*  HDL 86  LDLCALC 110*  TRIG 54  CHOLHDL 2.4     Medications:     Scheduled Medications: . aspirin EC  81 mg Oral Daily  . carvedilol  3.125 mg Oral BID WC  . furosemide  40 mg Oral  BID  . heparin  5,000 Units Subcutaneous Q8H  . isosorbide-hydrALAZINE  2 tablet Oral TID  . losartan  25 mg Oral Daily  . nicotine  21 mg Transdermal Daily  . potassium chloride  20 mEq Oral Daily  . sodium chloride flush  3 mL Intravenous Q12H  . spironolactone  25 mg Oral Daily    Infusions: . sodium chloride      PRN Medications: sodium chloride, acetaminophen, ALPRAZolam, ipratropium-albuterol, ondansetron (ZOFRAN) IV, sodium chloride flush    Patient Profile   Gerald Nelson is a 66 y.o. male with a history of CHF (Echo 01/2017 EF 15%), pulmonary HTN, CVA, schizophrenia, CKD stage III.  Admitted 04/18/17 with worsening DOE.   Assessment/Plan   1. Acute on chronic systolic CHF: Echo 11/1759 EF 15%.  - Pt has refused cath - Volume status much improved with IV diuresis. - Continue coreg 3.125 mg BID - Continue bidil 2 tabs TID.  - Continue spiro 25 mg daily.   - Resume losartan 25 mg  daily.  2. Questionable apical thrombus:  - felt not to be an anticoagulation candidate with non compliance.  3. MR  - Mod/Sev on last echo. Suspect functional. Will need fu echos in the future  4. Tobacco abuse -Counseled on discontinuing.  5. CKD III:  -Creatinine unchanged today. Resume cozaar.  6. Hyponatremia -Will fluid restrict to 1.5 liters daily  7. HTN - BP now controlled; continue present meds.  8. Hyperlipidemia - Begin lipitor 40 mg daily; check lipids and liver 4 weeks   Refused heart cath. He is at high risk for readmit due to poor insight. Palliative Care consulted. He is requesting FCB. Refuses Hospice Services.   He has been referred to paramedicine.   FU CHF clinic Friday as scheduled; check BMET at that time.  > 30 min PA and physician time D2  Length of Stay: [place], MD  04/23/2017, 11:04 AM

## 2017-04-23 NOTE — Discharge Summary (Signed)
Discharge Summary    Patient ID: Gerald Nelson,  MRN: [id number], DOB/AGE: August 01, 1950 66 y.o.  Admit date: 04/18/2017 Discharge date: 04/23/2017  Primary Care Provider: [name] Primary Cardiologist: Dr. Percival Spanish, CHF - Dr. Aundra Dubin  Discharge Diagnoses    Principal Problem:   Acute on chronic systolic (congestive) heart failure [facility]) Active Problems:   Schizophrenia ([place])   CKD (chronic kidney disease), stage III ([place])   Essential hypertension   Tobacco abuse   SOB (shortness of breath)   Palliative care by specialist   Noncompliance   Mitral regurgitation   Thrombus - possible apical thrombus 01/2017   Hyperlipidemia   Pulmonary hypertension ([place])    Diagnostic Studies/Procedures    N/A _____________     History of Present Illness     Gerald Nelson is a 66 y.o. male with history of Chronic systolic CHF, pulmonary HTN, mitral regurgitation, possible LV thrombus, CVA, schizophrenia, CKD stage III, COPD who presented to [facility] with dyspnea  He was diagnosed with CHF in 01/2017 at which time echo showed EF 15%, moderate LVH, severe focal basal hypertrophy of the septum, mild AI, mod-severe MR, mod LAE, low-normal RV function, moderate-severe TR, and elevated pulmonary pressure of 80mmHg. There was also a question of apical thrombus although this was not clearly identified.Unfortunately, he left the hospital AMA. He has left the hospital several times before and has limited therapeutic options because of social and psych issues and compliance. He left on 10/3 and has stayed at a boarding house since then. Most recently he left [facility] 04/07/17. He was seen back in the office by Dr. Percival Spanish on 04/18/2017 reporting continued DOE, cough, and leg edema. He reported he had been taking meds but this could not be verified. It was not clear that he was compliant with sodium restriction (eating pasta and ground beef). Prior discharge weights were variable, reportedly 159  in our office the day of his visit with Dr. Percival Spanish (with measured weight of 156 on 10/16). Due to concern for significant volume overload he was admitted for further evaluation.    Hospital Course    1. Acute on chronic systolic CHF with pulmonary HTN: Echo 01/2017 EF 15%.  - Initial CXR 04/19/17 showed small left effusion and pulmonary edema - Treated with IV Lasix - with negative 25lb total and net 1.7L. This was transitioned to Lasix 40mg  BID at discharge - Diltiazem/Clonidine were stopped and he was started on Coreg, Bidil, spironolactone and losartan - There has been a suspicion for ischemic cardiomyopathy but the patient has refused cath workup - During admission it was determined patient was high risk for readmission due to poor insight. Palliative care was consulted. He is requesting FCB. He refused hospice services. The heart failure team referred him to paramedicine. Close OP f/u in the [facility] on 04/29/17 has been arranged. Dr. Aundra Dubin spoke with patient's caregiver (per his note " She is going to try to help him organize his medications and stay on them.  He has actually been very compliant with visits to his psychiatrist for schizophrenia.  We will get him in the paramedicine program as well and try to arrange home health.") - 30 day prescriptions were given with 1 refill given history of noncompliance (except spironolactone - given CKD, zero refills given) and need to f/u clinic to ensure safe to continue.  2. Questionable apical thrombus in 01/2017 - felt not to be an anticoagulation candidate with non compliance.  3.  Mitral regurgitation - Moderate/Severe on last echo. Suspect functional. Will need fu echos in the future  4. Tobacco abuse -Counseled on discontinuing - Nicotine patch rx printed and given to pt  5. CKD III:  -Admitting Cr was 1.56, rose with diuresis to a peak of 2.09 and has remained there. Losartan was initially held then restarted. - prior  values appear to have ranged 1.6-1.9 - This will need close outpatient follow-up  6. Hyponatremia -Variable during admission, down to 128 at discharge - will need further follow-up  7. HTN - BP elevated early on admission, now controlled on current regimen   8. Hyperlipidemia - LDL 110 this admission, AST 60, ALT 37 - Dr. Stanford Breed recommended atorvastatin 40mg  daily - If the patient is tolerating statin/compliant at time of follow-up appointment, would recommend to recheck liver function/lipid panel in 4 weeks  _____________  Discharge Vitals Blood pressure 124/81, pulse 82, temperature 98 F (36.7 C), temperature source Oral, resp. rate 18, height 5\' 10"  (1.778 m), weight 136 lb 3.2 oz (61.8 kg), SpO2 96 %.  Filed Weights   04/21/17 0454 04/22/17 0531 04/23/17 0451  Weight: 130 lb 9.6 oz (59.2 kg) 133 lb (60.3 kg) 136 lb 3.2 oz (61.8 kg)    Labs & Radiologic Studies    CBC No results for input(s): WBC, NEUTROABS, HGB, HCT, MCV, PLT in the last 72 hours. Basic Metabolic Panel  Recent Labs  04/21/17 0417 04/22/17 0544 04/23/17 0437  NA 137 130* 128*  K 4.6 4.3 4.2  CL 93* 92* 92*  CO2 33* 28 28  GLUCOSE 107* 90 110*  BUN 26* 29* 29*  CREATININE 1.97* 2.09* 2.09*  CALCIUM 9.2 8.9 8.6*  MG 1.9  --   --    Fasting Lipid Panel  Recent Labs  04/23/17 0437  CHOL 207*  HDL 86  LDLCALC 110*  TRIG 54  CHOLHDL 2.4   ____________  Dg Chest 2 View  Result Date: 04/05/2017 CLINICAL DATA:  Shortness of breath EXAM: CHEST  2 VIEW COMPARISON:  01/26/2017 FINDINGS: Hyperinflation. Tiny bilateral pleural effusions. Moderate-to-marked cardiomegaly with globular cardiac configuration. Vascular congestion and mild diffuse interstitial opacity, suspicious for mild edema. No pneumothorax. Degenerative changes of the spine. IMPRESSION: Cardiomegaly with vascular congestion and mild diffuse interstitial edema. Tiny pleural effusions. Electronically Signed   By: Donavan Foil M.D.    On: 04/05/2017 22:12   Dg Chest Port 1 View  Result Date: 04/19/2017 CLINICAL DATA:  Shortness of breath, productive cough EXAM: PORTABLE CHEST 1 VIEW COMPARISON:  04/05/2017 FINDINGS: Cardiomegaly with vascular congestion. Improving interstitial prominence, likely improving interstitial edema. Small left effusion. Minimal left base atelectasis. IMPRESSION: Slight improvement in pulmonary edema pattern. Small left effusion with left base atelectasis. Electronically Signed   By: Rolm Baptise M.D.   On: 04/19/2017 08:22   Disposition   Pt is being discharged home today in good condition.  Follow-up Plans & Appointments    Follow-up Information    [facility] Follow up.   Specialty:  Cardiology Why:  04/29/17 at 1030 for post hospital follow up. The code for parking is 8000. Can enter thru construction off [address]. Underground parking on your right. Can also park in lower ED lot and enter through blue awning.  Contact information: [address] [id number] [place] [phone]         Discharge Instructions    Diet - low sodium heart healthy  Complete by:  As directed    Follow a low-salt diet - you are allowed no more than 2,000mg  of sodium per day. Watch your fluid intake. In general, you should not be taking in more than 2 liters of fluid per day (no more than 8 glasses per day). This includes sources of water in foods like soup, coffee, tea, milk, etc.   Increase activity slowly    Complete by:  As directed       Discharge Medications   Allergies as of 04/23/2017      Reactions   Sulfa Antibiotics Nausea Only      Medication List    STOP taking these medications   cloNIDine 0.1 MG tablet Commonly known as:  CATAPRES   diltiazem 240 MG 24 hr capsule Commonly known as:  CARDIZEM CD     TAKE these medications   acetaminophen 500 MG tablet Commonly known as:  TYLENOL Take 1,000 mg  by mouth every 6 (six) hours as needed for mild pain.   albuterol 108 (90 Base) MCG/ACT inhaler Commonly known as:  PROVENTIL HFA;VENTOLIN HFA Inhale 1-2 puffs into the lungs every 6 (six) hours as needed for wheezing or shortness of breath.   aspirin 81 MG tablet Take 1 tablet (81 mg total) by mouth daily. What changed:  medication strength  how much to take   atorvastatin 40 MG tablet Commonly known as:  LIPITOR Take 1 tablet (40 mg total) by mouth every evening.   carvedilol 3.125 MG tablet Commonly known as:  COREG Take 1 tablet (3.125 mg total) by mouth 2 (two) times daily with a meal.   furosemide 40 MG tablet Commonly known as:  LASIX Take 1 tablet (40 mg total) by mouth 2 (two) times daily. What changed:  when to take this   haloperidol decanoate 100 MG/ML injection Commonly known as:  HALDOL DECANOATE Inject 100 mg into the muscle every 30 (thirty) days.   isosorbide-hydrALAZINE 20-37.5 MG tablet Commonly known as:  BIDIL Take 2 tablets by mouth 3 (three) times daily.   losartan 25 MG tablet Commonly known as:  COZAAR Take 1 tablet (25 mg total) by mouth daily.   nicotine 21 mg/24hr patch Commonly known as:  NICODERM CQ - dosed in mg/24 hours Apply 21mg  patch one daily for 6 weeks (dispense #42), then change to 14mg  patch one daily for 2 weeks (dispense #14), then change to 7mg  patch one daily for 2 weeks (dispense #14). Remove old patch before applying new one.   potassium chloride SA 20 MEQ tablet Commonly known as:  K-DUR,KLOR-CON Take 1 tablet (20 mEq total) by mouth daily.   spironolactone 25 MG tablet Commonly known as:  ALDACTONE Take 1 tablet (25 mg total) by mouth daily.            Durable Medical Equipment        Start     Ordered   04/22/17 [phone]  Heart failure home health orders  (Heart failure home health orders / Face to face)  Once    Comments:  Heart Failure Follow-up Care:  Verify follow-up appointments per Patient Discharge  Instructions. Confirm transportation arranged. Reconcile home medications with discharge medication list. Remove discontinued medications from use. Assist patient/caregiver to manage medications using pill box. Reinforce low sodium food selection Assessments: Vital signs and oxygen saturation at each visit. Assess home environment for safety concerns, caregiver support and availability of low-sodium foods. Consult Education officer, museum, PT/OT, Dietitian, and CNA based on assessments.  Perform comprehensive cardiopulmonary assessment. Notify MD for any change in condition or weight gain of 3 pounds in one day or 5 pounds in one week with symptoms. Daily Weights and Symptom Monitoring: Ensure patient has access to scales. Teach patient/caregiver to weigh daily before breakfast and after voiding using same scale and record.    Teach patient/caregiver to track weight and symptoms and when to notify Provider. Activity: Develop individualized activity plan with patient/caregiver.  Labs as needed per HF clinic  Question Answer Comment  Heart Failure Follow-up Care Advanced Heart Failure (AHF) Clinic at [phone]   Obtain the following labs Other see comments   Lab frequency Other see comments   Fax lab results to AHF Clinic at [phone]   Diet Low Sodium Heart Healthy   Fluid restrictions: 2000 mL Fluid      04/22/17 0838       Allergies:  Allergies  Allergen Reactions  . Sulfa Antibiotics Nausea Only    Outstanding Labs/Studies   N/A  Duration of Discharge Encounter   Greater than 30 minutes including physician time.  Signed, Charlie Pitter PA-C 04/23/2017, 11:55 AM

## 2017-04-26 ENCOUNTER — Institutional Professional Consult (permissible substitution): Payer: Self-pay | Admitting: Internal Medicine

## 2017-04-28 ENCOUNTER — Other Ambulatory Visit: Payer: Self-pay | Admitting: Emergency Medicine

## 2017-04-29 ENCOUNTER — Ambulatory Visit (HOSPITAL_COMMUNITY)
Admit: 2017-04-29 | Discharge: 2017-04-29 | Disposition: A | Discharge status: Home / Self Care | Payer: Medicare Other | Source: Ambulatory Visit | Attending: Internal Medicine | Admitting: Internal Medicine

## 2017-04-29 ENCOUNTER — Encounter (HOSPITAL_COMMUNITY): Payer: Self-pay

## 2017-04-29 ENCOUNTER — Other Ambulatory Visit (HOSPITAL_COMMUNITY): Payer: Self-pay

## 2017-04-29 VITALS — BP 116/72 | HR 92 | Wt 141.6 lb

## 2017-04-29 DIAGNOSIS — Z79899 Other long term (current) drug therapy: Secondary | ICD-10-CM | POA: Insufficient documentation

## 2017-04-29 DIAGNOSIS — J449 Chronic obstructive pulmonary disease, unspecified: Secondary | ICD-10-CM | POA: Insufficient documentation

## 2017-04-29 DIAGNOSIS — I513 Intracardiac thrombosis, not elsewhere classified: Secondary | ICD-10-CM | POA: Diagnosis not present

## 2017-04-29 DIAGNOSIS — I340 Nonrheumatic mitral (valve) insufficiency: Secondary | ICD-10-CM | POA: Diagnosis not present

## 2017-04-29 DIAGNOSIS — I272 Pulmonary hypertension, unspecified: Secondary | ICD-10-CM | POA: Insufficient documentation

## 2017-04-29 DIAGNOSIS — Z8249 Family history of ischemic heart disease and other diseases of the circulatory system: Secondary | ICD-10-CM | POA: Insufficient documentation

## 2017-04-29 DIAGNOSIS — Z882 Allergy status to sulfonamides status: Secondary | ICD-10-CM | POA: Insufficient documentation

## 2017-04-29 DIAGNOSIS — F17210 Nicotine dependence, cigarettes, uncomplicated: Secondary | ICD-10-CM | POA: Insufficient documentation

## 2017-04-29 DIAGNOSIS — Z720 Tobacco use: Secondary | ICD-10-CM

## 2017-04-29 DIAGNOSIS — Z8673 Personal history of transient ischemic attack (TIA), and cerebral infarction without residual deficits: Secondary | ICD-10-CM | POA: Diagnosis not present

## 2017-04-29 DIAGNOSIS — I5022 Chronic systolic (congestive) heart failure: Secondary | ICD-10-CM | POA: Diagnosis not present

## 2017-04-29 DIAGNOSIS — E785 Hyperlipidemia, unspecified: Secondary | ICD-10-CM | POA: Diagnosis not present

## 2017-04-29 DIAGNOSIS — Z7982 Long term (current) use of aspirin: Secondary | ICD-10-CM | POA: Diagnosis not present

## 2017-04-29 DIAGNOSIS — N183 Chronic kidney disease, stage 3 unspecified: Secondary | ICD-10-CM

## 2017-04-29 DIAGNOSIS — F209 Schizophrenia, unspecified: Secondary | ICD-10-CM | POA: Diagnosis not present

## 2017-04-29 DIAGNOSIS — I10 Essential (primary) hypertension: Secondary | ICD-10-CM

## 2017-04-29 DIAGNOSIS — I130 Hypertensive heart and chronic kidney disease with heart failure and stage 1 through stage 4 chronic kidney disease, or unspecified chronic kidney disease: Secondary | ICD-10-CM | POA: Diagnosis not present

## 2017-04-29 LAB — BASIC METABOLIC PANEL
Anion gap: 10 (ref 5–15)
BUN: 32 mg/dL — ABNORMAL HIGH (ref 6–20)
CHLORIDE: 94 mmol/L — AB (ref 101–111)
CO2: 25 mmol/L (ref 22–32)
CREATININE: 1.95 mg/dL — AB (ref 0.61–1.24)
Calcium: 8.8 mg/dL — ABNORMAL LOW (ref 8.9–10.3)
GFR calc non Af Amer: 35 mL/min — ABNORMAL LOW (ref >60)
GFR, EST AFRICAN AMERICAN: 40 mL/min — AB (ref >60)
GLUCOSE: 114 mg/dL — AB (ref 65–99)
Potassium: 3.8 mmol/L (ref 3.5–5.1)
Sodium: 129 mmol/L — ABNORMAL LOW (ref 135–145)

## 2017-04-29 MED ORDER — FUROSEMIDE 40 MG PO TABS: 40.0000 mg | ORAL_TABLET | Freq: Two times a day (BID) | ORAL | 6 refills | Status: DC

## 2017-04-29 NOTE — Progress Notes (Signed)
Advanced Heart Failure Clinic Note    Primary Care: Dr. Deforest Hoyles   Primary Cardiologist: Dr. Harrington Challenger, Dr. Aundra Dubin   HPI: Gerald Nelson is a 66 year old male with a past medical history of chronic systolic CHF (EF 08% in July 2018), severe MR/TR, pulmonary HTN, CVA, CKD, schizophrenia, HTN and tobacco abuse.   Gerald Nelson was admitted to [facility] 7/2-01/06/17 for newly diagnosed acute systolic CHF. Echo showed LVEF 15%, severe global hypokinesis, moderate LVH, coarsetrabeculation of the LV apex with numerous false tendinae of the left ventricle, very stagnant blood flow at the LV apex withsmoke but no obvious LV thrombus - Definity contrast was given,again, noting stagnant apical blood flow - this could suggestrecent thrombus and certainly high risk for apical thrombusformation. Other findings include aortic sclerosis with mild AI,moderate to severe MR, moderate LAE, mild RAE, moderate to severeTR, moderate to severe pulmonary hypertension (RVSP 73 mmHg), dilated IVC, trivial posterior pericardial effusion - tamponade physiology could be excluded given the degree of pulmonaryhypertension.He was seen by Dr. Harrington Challenger during this admission who felt that he needed a L/RHC, but patient left AMA. When he left, he did not pick up any medications due to lack of money and only continued to take ASA. He lives in a boarding house with two room mates and lives off of social security. He does have medicare. The only medication he takes regularly is haldol given to him by his psychiatrist.He has left the hospital several times before and has limited therapeutic options because of social and psych issues and compliance. He left on 10/3 and has stayed at a boarding house since then.  He left AMA 04/07/17. He was seen back in the office by Dr. Percival Spanish on 04/18/2017. Reported continued DOE, cough, and leg edema. He reported he had been taking meds but this could not be verified. It was not clear that he was compliant with sodium  restriction (eating pasta and ground beef). Prior discharge weights were variable, reportedly 159 in our office the day of his visit with Dr. Percival Spanish (with measured weight of 156 on 10/16). Due to concern for significant volume overload he was admitted for further evaluation.   He diuresed 25 pounds on IV lasix. He refused cath. His clonidine and diltazem were stopped during this admission. Referred to paramedicine. Discharge weight 133 pounds.   He returns today for HF follow up. He is only taking lisinopril, lasix and diltiazem. He says he cannot afford any other medications until next Friday when he gets his check. Even then, his financial situation is difficult. Weight today is 8 pounds up from discharge weight. Does feel better, denies SOB with walking down the street. Denies orthopnea, PND. He is only taking lasix once a day. Eating pork chops, fried chicken. Eats mostly fast food. He is going to have his first visit with paramedicine today.    Review of Systems: [y] = yes, [ ]  = no   General: Weight gain Blue.Reese ]; Weight loss [ ] ; Anorexia [ ] ; Fatigue [ ] ; Fever [ ] ; Chills [ ] ; Weakness [ ]   Cardiac: Chest pain/pressure [ ] ; Resting SOB [ ] ; Exertional SOB [ ] ; Orthopnea [ ] ; Pedal Edema [ ] ; Palpitations [ ] ; Syncope [ ] ; Presyncope [ ] ; Paroxysmal nocturnal dyspnea[ ]   Pulmonary: Cough [ ] ; Wheezing[ ] ; Hemoptysis[ ] ; Sputum [ ] ; Snoring [ ]   GI: Vomiting[ ] ; Dysphagia[ ] ; Melena[ ] ; Hematochezia [ ] ; Heartburn[ ] ; Abdominal pain [ ] ; Constipation [ ] ; Diarrhea [ ] ;  BRBPR [ ]   GU: Hematuria[ ] ; Dysuria [ ] ; Nocturia[ ]   Vascular: Pain in legs with walking [ ] ; Pain in feet with lying flat [ ] ; Non-healing sores [ ] ; Stroke [ ] ; TIA [ ] ; Slurred speech [ ] ;  Neuro: Headaches[ ] ; Vertigo[ ] ; Seizures[ ] ; Paresthesias[ ] ;Blurred vision [ ] ; Diplopia [ ] ; Vision changes [ ]   Ortho/Skin: Arthritis Blue.Reese ]; Joint pain [ ] ; Muscle pain [ ] ; Joint swelling [ ] ; Back Pain [ ] ; Rash [ ]   Psych:  Depression[ ] ; Anxiety[ ]   Heme: Bleeding problems [ ] ; Clotting disorders [ ] ; Anemia [ ]   Endocrine: Diabetes [ ] ; Thyroid dysfunction[ ]    Past Medical History:  Diagnosis Date  . Apical mural thrombus    a. question of apical thrombus on echo 01/2017, pt left AMA, not felt to be anticoag candidate with noncompliance.  . Arthritis   . CHF (congestive heart failure) ([place])   . Chronic systolic CHF (congestive heart failure) ([place])    a. pt refused cath. EF 15% 01/2017.  . CKD (chronic kidney disease), stage III ([place])   . COPD (chronic obstructive pulmonary disease) ([place])   . Hernia, hiatal   . Hyperlipidemia   . Hypertension   . Hyponatremia   . Mitral regurgitation    a. mod-severe by echo 01/2017.  . Pulmonary hypertension ([place])   . Schizophrenia ([place])   . Tobacco abuse     Current Outpatient Prescriptions  Medication Sig Dispense Refill  . furosemide (LASIX) 40 MG tablet Take 1 tablet (40 mg total) by mouth 2 (two) times daily. 60 tablet 1  . acetaminophen (TYLENOL) 500 MG tablet Take 1,000 mg by mouth every 6 (six) hours as needed for mild pain.    Marland Kitchen albuterol (PROVENTIL HFA;VENTOLIN HFA) 108 (90 Base) MCG/ACT inhaler Inhale 1-2 puffs into the lungs every 6 (six) hours as needed for wheezing or shortness of breath.    Marland Kitchen aspirin 81 MG tablet Take 1 tablet (81 mg total) by mouth daily. (Patient not taking: Reported on 04/29/2017) 30 tablet 1  . atorvastatin (LIPITOR) 40 MG tablet Take 1 tablet (40 mg total) by mouth every evening. (Patient not taking: Reported on 04/29/2017) 30 tablet 1  . carvedilol (COREG) 3.125 MG tablet Take 1 tablet (3.125 mg total) by mouth 2 (two) times daily with a meal. (Patient not taking: Reported on 04/29/2017) 60 tablet 1  . haloperidol decanoate (HALDOL DECANOATE) 100 MG/ML injection Inject 100 mg into the muscle every 30 (thirty) days.   99  . isosorbide-hydrALAZINE (BIDIL) 20-37.5 MG tablet Take 2 tablets by mouth 3 (three) times daily. (Patient not  taking: Reported on 04/29/2017) 180 tablet 1  . losartan (COZAAR) 25 MG tablet Take 1 tablet (25 mg total) by mouth daily. (Patient not taking: Reported on 04/29/2017) 30 tablet 1  . nicotine (NICODERM CQ - DOSED IN MG/24 HOURS) 21 mg/24hr patch Apply 21mg  patch one daily for 6 weeks (dispense #42), then change to 14mg  patch one daily for 2 weeks (dispense #14), then change to 7mg  patch one daily for 2 weeks (dispense #14). Remove old patch before applying new one. (Patient not taking: Reported on 04/29/2017) 70 patch 0  . potassium chloride SA (K-DUR,KLOR-CON) 20 MEQ tablet Take 1 tablet (20 mEq total) by mouth daily. (Patient not taking: Reported on 04/29/2017) 30 tablet 1  . spironolactone (ALDACTONE) 25 MG tablet Take 1 tablet (25 mg total) by mouth daily. (Patient not taking: Reported on 04/29/2017) 30 tablet  0   No current facility-administered medications for this encounter.     Allergies  Allergen Reactions  . Sulfa Antibiotics Nausea Only      Social History   Social History  . Marital status: Single    Spouse name: N/A  . Number of children: N/A  . Years of education: N/A   Occupational History  . Not on file.   Social History Main Topics  . Smoking status: Current Every Day Smoker    Packs/day: 1.50    Types: Cigarettes  . Smokeless tobacco: Never Used  . Alcohol use No  . Drug use: No  . Sexual activity: Not on file   Other Topics Concern  . Not on file   Social History Narrative  . No narrative on file      Family History  Problem Relation Age of Onset  . Hypertension Mother   . Hypertension Father     Vitals:   04/29/17 1010  BP: 116/72  Pulse: 92  SpO2: 99%  Weight: 141 lb 9.6 oz (64.2 kg)     PHYSICAL EXAM: General:  Fatigued appearing. No respiratory difficulty HEENT: normal Neck: supple. 7-8 cm JVD. Carotids 2+ bilat; no bruits. No lymphadenopathy or thyromegaly appreciated. Cor: PMI nondisplaced. Regular rate & rhythm. 2/6 SEM.  Lungs:  clear bilaterally.  Abdomen: soft, nontender, nondistended. No hepatosplenomegaly. No bruits or masses. Good bowel sounds. Extremities: no cyanosis, clubbing, rash. 1+ pedal edema.  Neuro: alert & oriented x 3, cranial nerves grossly intact. moves all 4 extremities w/o difficulty. Affect pleasant.  ASSESSMENT & PLAN: 1. Chronic systolic CHF: Echo 08/98 EF 15%.  - Continues to refuse ischemic work up.  - NYHA II-III - Volume elevated on exam. I have asked him to start taking his Lasix BID, currently only taking once a day.  - Ideal medical regimen for him would be - Coreg, losartan and Arlyce Harman. However, he does not have the funds to pay for these medicines despite having insurance. Will follow up with him in one month, he says he will have more access to funds then.  - Diltiazem is not ideal for him with low EF. At the very least, this should be stopped and start Coreg. I discussed this with him and he does not have any way to get Coreg.  - Follow up in one month.    2. Questionable apical thrombus:  - Poor anticoagulation candidate.   3. MR - Mod/Sev on last echo. Suspect functional    4. Tobacco abuse - Currently smokes about a pack a day. Encouraged cessation  5. CKD III:  -BMET today.   6. SCHIZOPHRENIA:  - Follows with PCP.   7. HTN - BP well controlled, but not on ideal regimen as above.    8. COPD - Stable. Encouraged him to quit smoking.   Follow up in one month.   Arbutus Leas, NP 04/29/17

## 2017-04-29 NOTE — Progress Notes (Signed)
Paramedicine Encounter   Patient ID: Gerald Nelson , male,   DOB: 06-02-1951,66 y.o.,  MRN: [id number]   Met patient in clinic today with provide Erin.   Today was pt's f/u since he was hospitalized.  Pt is currently out of several medications due to financial difficulties and him not being able to afford his meds.  I will f/u with pt today and take him a pill box and pill bag.   Time spent with patient 20 mins  Panzy Bubeck, EMT-Paramedic 04/29/2017   ACTION: Home visit completed

## 2017-04-29 NOTE — Patient Instructions (Signed)
INCREASE Lasix to 40 mg (1 tab) twice daily.  Routine lab work today. Will notify you of abnormal results, otherwise no news is good news!  Follow up 1 month.  ______________________________________________________________  Marin Roberts Code: 8001  Take all medication as prescribed the day of your appointment. Bring all medications with you to your appointment.  Do the following things EVERYDAY: 1) Weigh yourself in the morning before breakfast. Write it down and keep it in a log. 2) Take your medicines as prescribed 3) Eat low salt foods-Limit salt (sodium) to 2000 mg per day.  4) Stay as active as you can everyday 5) Limit all fluids for the day to less than 2 liters

## 2017-05-03 ENCOUNTER — Other Ambulatory Visit (HOSPITAL_COMMUNITY): Payer: Self-pay

## 2017-05-03 NOTE — Progress Notes (Signed)
Paramedicine Encounter    Patient ID: Gerald Nelson, male    DOB: Nov 12, 1950, 66 y.o.   MRN: [id number]    Patient Care Team: [name], MD as PCP - General (Internal Medicine)  Patient Active Problem List   Diagnosis Date Noted  . Noncompliance 04/23/2017  . Mitral regurgitation 04/23/2017  . Thrombus - possible apical thrombus 01/2017 04/23/2017  . Hyperlipidemia 04/23/2017  . Pulmonary hypertension ([place])   . SOB (shortness of breath)   . Palliative care by specialist   . Tobacco abuse 04/19/2017  . Acute on chronic systolic (congestive) heart failure ([place]) 04/18/2017  . Acute on chronic systolic CHF (congestive heart failure) ([place]) 04/06/2017  . Acute respiratory failure with hypoxia ([place]) 01/27/2017  . Hypertensive urgency 01/27/2017  . CHF (congestive heart failure) ([place]) 01/26/2017  . Acute combined systolic and diastolic HF (heart failure), NYHA class 4 ([place])   . CKD (chronic kidney disease), stage III ([place]) 01/04/2017  . Essential hypertension 01/04/2017  . Acute CHF (congestive heart failure) ([place]) 01/03/2017  . CVA (cerebral infarction) 12/12/2013  . Hyponatremia 12/06/2012  . Hiatal hernia 12/06/2012  . Syncope 12/06/2012  . Schizophrenia ([place])     Current Outpatient Prescriptions:  .  aspirin 81 MG tablet, Take 1 tablet (81 mg total) by mouth daily., Disp: 30 tablet, Rfl: 1 .  atorvastatin (LIPITOR) 40 MG tablet, Take 1 tablet (40 mg total) by mouth every evening., Disp: 30 tablet, Rfl: 1 .  carvedilol (COREG) 3.125 MG tablet, Take 1 tablet (3.125 mg total) by mouth 2 (two) times daily with a meal., Disp: 60 tablet, Rfl: 1 .  furosemide (LASIX) 40 MG tablet, Take 1 tablet (40 mg total) by mouth 2 (two) times daily., Disp: 60 tablet, Rfl: 6 .  haloperidol decanoate (HALDOL DECANOATE) 100 MG/ML injection, Inject 100 mg into the muscle every 30 (thirty) days. , Disp: , Rfl: 99 .  isosorbide-hydrALAZINE (BIDIL) 20-37.5 MG tablet, Take 2 tablets by mouth 3 (three)  times daily., Disp: 180 tablet, Rfl: 1 .  losartan (COZAAR) 25 MG tablet, Take 1 tablet (25 mg total) by mouth daily., Disp: 30 tablet, Rfl: 1 .  potassium chloride SA (K-DUR,KLOR-CON) 20 MEQ tablet, Take 1 tablet (20 mEq total) by mouth daily., Disp: 30 tablet, Rfl: 1 .  spironolactone (ALDACTONE) 25 MG tablet, Take 1 tablet (25 mg total) by mouth daily., Disp: 30 tablet, Rfl: 0 .  acetaminophen (TYLENOL) 500 MG tablet, Take 1,000 mg by mouth every 6 (six) hours as needed for mild pain., Disp: , Rfl:  .  albuterol (PROVENTIL HFA;VENTOLIN HFA) 108 (90 Base) MCG/ACT inhaler, Inhale 1-2 puffs into the lungs every 6 (six) hours as needed for wheezing or shortness of breath., Disp: , Rfl:  .  nicotine (NICODERM CQ - DOSED IN MG/24 HOURS) 21 mg/24hr patch, Apply 21mg  patch one daily for 6 weeks (dispense #42), then change to 14mg  patch one daily for 2 weeks (dispense #14), then change to 7mg  patch one daily for 2 weeks (dispense #14). Remove old patch before applying new one. (Patient not taking: Reported on 04/29/2017), Disp: 70 patch, Rfl: 0 Allergies  Allergen Reactions  . Sulfa Antibiotics Nausea Only     Social History   Social History  . Marital status: Single    Spouse name: N/A  . Number of children: N/A  . Years of education: N/A   Occupational History  . Not on file.   Social History Main Topics  . Smoking status: Current Every  Day Smoker    Packs/day: 1.50    Types: Cigarettes  . Smokeless tobacco: Never Used  . Alcohol use No  . Drug use: No  . Sexual activity: Not on file   Other Topics Concern  . Not on file   Social History Narrative  . No narrative on file    Physical Exam  Pulmonary/Chest: No respiratory distress.  Abdominal: He exhibits no distension. There is no tenderness. There is no guarding.  Musculoskeletal: He exhibits no edema.  Skin: Skin is warm and dry. He is not diaphoretic.        Future Appointments Date Time Provider [place]   05/17/2017 11:15 AM Brand Males, MD [facility] None  06/01/2017 10:30 AM [facility] PA/NP [facility] None    ATF pt CAO x4 sitting in his living room with no complaints.  Pt stated that he has some sob after walking a lot.  He has already taken his morning meds and his afternoon medications.  Pt was given a new scale and a pill box during this visit. I explained to him how to use the pill box.  Pt has all of his meds including diltizem (not documented in epic).  Pt stated that he cant eat non-fried foods due to him not having a oven.  The landlord provided a stove which the oven doesn't work.  Pt is unsure how to measure his fluid intake therefore, we will look for him a cup to help him keep track.  He stated that he is very active; he rides the city bus around town during the week.  He has no issues affording food because he receives food stamps and SSI.  rx bottles verified and pill box filled during this visit.  BP 118/82   Pulse 79   Resp 16   Wt 150 lb 3.2 oz (68.1 kg)   SpO2 98%   BMI 21.55 kg/m      Alizandra Loh, EMT Paramedic 05/03/2017    ACTION: Home visit completed Next visit planned for next tuesday

## 2017-05-10 ENCOUNTER — Other Ambulatory Visit (HOSPITAL_COMMUNITY): Payer: Self-pay

## 2017-05-10 NOTE — Progress Notes (Signed)
Paramedicine Encounter    Patient ID: Gerald Nelson, male    DOB: Sep 29, 1950, 66 y.o.   MRN: [id number]    Patient Care Team: Gerald Low, MD as PCP - General (Internal Medicine)  Patient Active Problem List   Diagnosis Date Noted  . Noncompliance 04/23/2017  . Mitral regurgitation 04/23/2017  . Thrombus - possible apical thrombus 01/2017 04/23/2017  . Hyperlipidemia 04/23/2017  . Pulmonary hypertension ([place])   . SOB (shortness of breath)   . Palliative care by specialist   . Tobacco abuse 04/19/2017  . Acute on chronic systolic (congestive) heart failure ([place]) 04/18/2017  . Acute on chronic systolic CHF (congestive heart failure) ([place]) 04/06/2017  . Acute respiratory failure with hypoxia ([place]) 01/27/2017  . Hypertensive urgency 01/27/2017  . CHF (congestive heart failure) ([place]) 01/26/2017  . Acute combined systolic and diastolic HF (heart failure), NYHA class 4 ([place])   . CKD (chronic kidney disease), stage III ([place]) 01/04/2017  . Essential hypertension 01/04/2017  . Acute CHF (congestive heart failure) ([place]) 01/03/2017  . CVA (cerebral infarction) 12/12/2013  . Hyponatremia 12/06/2012  . Hiatal hernia 12/06/2012  . Syncope 12/06/2012  . Schizophrenia ([place])     Current Outpatient Medications:  .  aspirin 81 MG tablet, Take 1 tablet (81 mg total) by mouth daily., Disp: 30 tablet, Rfl: 1 .  atorvastatin (LIPITOR) 40 MG tablet, Take 1 tablet (40 mg total) by mouth every evening., Disp: 30 tablet, Rfl: 1 .  carvedilol (COREG) 3.125 MG tablet, Take 1 tablet (3.125 mg total) by mouth 2 (two) times daily with a meal., Disp: 60 tablet, Rfl: 1 .  furosemide (LASIX) 40 MG tablet, Take 1 tablet (40 mg total) by mouth 2 (two) times daily., Disp: 60 tablet, Rfl: 6 .  isosorbide-hydrALAZINE (BIDIL) 20-37.5 MG tablet, Take 2 tablets by mouth 3 (three) times daily., Disp: 180 tablet, Rfl: 1 .  losartan (COZAAR) 25 MG tablet, Take 1 tablet (25 mg total) by mouth daily., Disp: 30 tablet, Rfl:  1 .  potassium chloride SA (K-DUR,KLOR-CON) 20 MEQ tablet, Take 1 tablet (20 mEq total) by mouth daily., Disp: 30 tablet, Rfl: 1 .  spironolactone (ALDACTONE) 25 MG tablet, Take 1 tablet (25 mg total) by mouth daily., Disp: 30 tablet, Rfl: 0 .  acetaminophen (TYLENOL) 500 MG tablet, Take 1,000 mg by mouth every 6 (six) hours as needed for mild pain., Disp: , Rfl:  .  albuterol (PROVENTIL HFA;VENTOLIN HFA) 108 (90 Base) MCG/ACT inhaler, Inhale 1-2 puffs into the lungs every 6 (six) hours as needed for wheezing or shortness of breath., Disp: , Rfl:  .  haloperidol decanoate (HALDOL DECANOATE) 100 MG/ML injection, Inject 100 mg into the muscle every 30 (thirty) days. , Disp: , Rfl: 99 .  nicotine (NICODERM CQ - DOSED IN MG/24 HOURS) 21 mg/24hr patch, Apply 21mg  patch one daily for 6 weeks (dispense #42), then change to 14mg  patch one daily for 2 weeks (dispense #14), then change to 7mg  patch one daily for 2 weeks (dispense #14). Remove old patch before applying new one. (Patient not taking: Reported on 04/29/2017), Disp: 70 patch, Rfl: 0 Allergies  Allergen Reactions  . Sulfa Antibiotics Nausea Only     Social History   Socioeconomic History  . Marital status: Single    Spouse name: Not on file  . Number of children: Not on file  . Years of education: Not on file  . Highest education level: Not on file  Social Needs  . Emergency planning/management officer  strain: Not on file  . Food insecurity - worry: Not on file  . Food insecurity - inability: Not on file  . Transportation needs - medical: Not on file  . Transportation needs - non-medical: Not on file  Occupational History  . Not on file  Tobacco Use  . Smoking status: Current Every Day Smoker    Packs/day: 1.50    Types: Cigarettes  . Smokeless tobacco: Never Used  Substance and Sexual Activity  . Alcohol use: No  . Drug use: No  . Sexual activity: Not on file  Other Topics Concern  . Not on file  Social History Narrative  . Not on file     Physical Exam  Pulmonary/Chest: No respiratory distress.  Abdominal: He exhibits no distension. There is no tenderness.  Musculoskeletal: He exhibits no edema.  Skin: Skin is warm and dry. He is not diaphoretic.        Future Appointments  Date Time Provider [place]  05/17/2017 11:15 AM Gerald Males, MD [facility] None  06/01/2017 10:30 AM [facility] PA/NP [facility] None    ATF pt CAO x4 standing in his living room breathing about 28 bpm.  Pt stated that this is his normal.  I advised pt to sit down to see if his RR decreased, which it did. Pt stated that he has had to increase the amount of folded sheets he has under his pillow.  Pt denies chest pain and dizziness.  Pts pill box is empty besides todays afternoon pills.  rx bottles verified and pill box refilled.  [facility] clinic notified of pts RR; no increase in pt's weight. Pt ate "oddles of noodles, cheese grits and saugage" today. We discussed his food choices.   BP 130/90   Pulse 72   Wt 150 lb (68 kg)   SpO2 97%   BMI 21.52 kg/m   Weight yesterday-250 Last visit weight-250    Gerald Nelson, EMT Paramedic 05/10/2017    ACTION: Home visit completed

## 2017-05-17 ENCOUNTER — Ambulatory Visit (INDEPENDENT_AMBULATORY_CARE_PROVIDER_SITE_OTHER): Payer: Medicare Other | Admitting: Internal Medicine

## 2017-05-17 ENCOUNTER — Encounter: Payer: Self-pay | Admitting: Internal Medicine

## 2017-05-17 VITALS — BP 130/86 | HR 83 | Ht 70.0 in | Wt 148.4 lb

## 2017-05-17 DIAGNOSIS — R062 Wheezing: Secondary | ICD-10-CM

## 2017-05-17 DIAGNOSIS — Z87891 Personal history of nicotine dependence: Secondary | ICD-10-CM | POA: Diagnosis not present

## 2017-05-17 DIAGNOSIS — R05 Cough: Secondary | ICD-10-CM

## 2017-05-17 DIAGNOSIS — R0602 Shortness of breath: Secondary | ICD-10-CM

## 2017-05-17 DIAGNOSIS — R059 Cough, unspecified: Secondary | ICD-10-CM

## 2017-05-17 MED ORDER — TIOTROPIUM BROMIDE MONOHYDRATE 2.5 MCG/ACT IN AERS: 1.0000 | INHALATION_SPRAY | Freq: Every day | RESPIRATORY_TRACT | 0 refills | Status: DC

## 2017-05-17 NOTE — Patient Instructions (Addendum)
ICD-10-CM   1. SOB (shortness of breath) R06.02   2. Cough R05   3. Wheezing R06.2   4. Smoking history Z87.891    Concern is that you have COPD  Plan -Try sample of Spiriva Respimat once daily -Do full pulmonary function test  Follow-up -Return in the next few to several weeks to see my nurse practitioner but after completion of the pulmonary function test -At this time we will assess response to Spiriva as well

## 2017-05-17 NOTE — Progress Notes (Signed)
   Subjective:    Patient ID: EINER MEALS, male    DOB: 15-Mar-1951, 67 y.o.   MRN: [id number]  HPI    Review of Systems  Constitutional: Negative for fever and unexpected weight change.  HENT: Positive for postnasal drip and sneezing. Negative for congestion, dental problem, ear pain, nosebleeds, rhinorrhea, sinus pressure, sore throat and trouble swallowing.   Eyes: Negative for redness and itching.  Respiratory: Positive for cough, shortness of breath and wheezing. Negative for chest tightness.   Cardiovascular: Negative for palpitations and leg swelling.  Gastrointestinal: Negative for nausea and vomiting.  Genitourinary: Negative for dysuria.  Musculoskeletal: Negative for joint swelling.  Skin: Negative for rash.  Allergic/Immunologic: Negative.  Negative for environmental allergies, food allergies and immunocompromised state.  Neurological: Positive for headaches.  Hematological: Does not bruise/bleed easily.  Psychiatric/Behavioral: Negative for dysphoric mood. The patient is not nervous/anxious.        Objective:   Physical Exam        Assessment & Plan:

## 2017-05-17 NOTE — Progress Notes (Signed)
Subjective:     Patient ID: Gerald Nelson, male   DOB: 1950/11/15, 66 y.o.   MRN: [id number]   PCP [name], MD  HPI  IOV 05/17/2017  Chief Complaint  Patient presents with  . Advice Only    Referred by Dr. Deforest Hoyles due to COPD.  Pt has complaints of SOB, cough with white mucus. Denies any CP.    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  He has been referred by Dr. Deforest Hoyles for question of COPD according to patient complaint.  Patient himself states that he has only been short of breath for the last 1 year.  It is insidious onset.  Associated with cough and wheezing.  In July 2018 did get diagnosed with systolic heart failure and he says "a lot of fluid was pulled out" and he lost weight.  After this his symptoms have improved significantly although he still does have some residual shortness of breath cough and wheezing.  The shortness of breath is mild present only with exertion.  In fact when we walked him in the office today he did not desaturate not complain of shortness of breath.  She did have some wheezing on exam.  He continues to smoke 2 packs a day.  He started smoking  in 1974  20 years  Personal visualization of imaging: October 2018 chest x-ray shows cardiomegaly and some pulmonary congestion from his heart failure.  2014 and 2015 CT abdomen lung cuts in the base does not show any evidence of ILD.  Walking desaturation test on 05/17/2017 185 feet x 3 laps:  did NOT desaturate. Rest pulse ox was 100%, final pulse ox was 100%. HR response 84/min at rest to 93/min at peak exertion.  Echocardiogram January 04, 2017: Ejection fraction 15%   has a past medical history of Apical mural thrombus, Arthritis, CHF (congestive heart failure) (HCC), Chronic systolic CHF (congestive heart failure) ([place]), CKD (chronic kidney disease), stage III ([place]), COPD (chronic obstructive pulmonary disease) ([place]), Hernia, hiatal, Hyperlipidemia,  Hypertension, Hyponatremia, Mitral regurgitation, Pulmonary hypertension ([place]), Schizophrenia ([place]), and Tobacco abuse.   reports that he has been smoking cigarettes.  He has been smoking about 2.00 packs per day. he has never used smokeless tobacco.  Past Surgical History:  Procedure Laterality Date  . PENILE PROSTHESIS IMPLANT      Allergies  Allergen Reactions  . Sulfa Antibiotics Nausea Only    Immunization History  Administered Date(s) Administered  . Influenza, High Dose Seasonal PF 04/16/2017    Family History  Problem Relation Age of Onset  . Hypertension Mother   . Hypertension Father      Current Outpatient Medications:  .  albuterol (PROVENTIL HFA;VENTOLIN HFA) 108 (90 Base) MCG/ACT inhaler, Inhale 1-2 puffs into the lungs every 6 (six) hours as needed for wheezing or shortness of breath., Disp: , Rfl:  .  aspirin 81 MG tablet, Take 1 tablet (81 mg total) by mouth daily., Disp: 30 tablet, Rfl: 1 .  atorvastatin (LIPITOR) 40 MG tablet, Take 1 tablet (40 mg total) by mouth every evening., Disp: 30 tablet, Rfl: 1 .  carvedilol (COREG) 3.125 MG tablet, Take 1 tablet (3.125 mg total) by mouth 2 (two) times daily with a meal., Disp: 60 tablet, Rfl: 1 .  furosemide (LASIX) 40 MG tablet, Take 1 tablet (40 mg total) by mouth 2 (two) times daily., Disp: 60 tablet, Rfl: 6 .  haloperidol decanoate (HALDOL DECANOATE) 100 MG/ML injection, Inject  100 mg into the muscle every 30 (thirty) days. , Disp: , Rfl: 99 .  losartan (COZAAR) 25 MG tablet, Take 1 tablet (25 mg total) by mouth daily., Disp: 30 tablet, Rfl: 1 .  potassium chloride SA (K-DUR,KLOR-CON) 20 MEQ tablet, Take 1 tablet (20 mEq total) by mouth daily., Disp: 30 tablet, Rfl: 1 .  acetaminophen (TYLENOL) 500 MG tablet, Take 1,000 mg by mouth every 6 (six) hours as needed for mild pain., Disp: , Rfl:  .  isosorbide-hydrALAZINE (BIDIL) 20-37.5 MG tablet, Take 2 tablets by mouth 3 (three) times daily., Disp: 180 tablet, Rfl:  1 .  spironolactone (ALDACTONE) 25 MG tablet, Take 1 tablet (25 mg total) by mouth daily., Disp: 30 tablet, Rfl: 0    Review of Systems     Objective:   Physical Exam  Constitutional: He is oriented to person, place, and time. He appears well-developed and well-nourished. No distress.  Bearded Mild dishevele  HENT:  Head: Normocephalic and atraumatic.  Right Ear: External ear normal.  Left Ear: External ear normal.  Mouth/Throat: Oropharynx is clear and moist. No oropharyngeal exudate.  Eyes: Conjunctivae and EOM are normal. Pupils are equal, round, and reactive to light. Right eye exhibits no discharge. Left eye exhibits no discharge. No scleral icterus.  Neck: Normal range of motion. Neck supple. No JVD present. No tracheal deviation present. No thyromegaly present.  Cardiovascular: Normal rate, regular rhythm and intact distal pulses. Exam reveals no gallop and no friction rub.  No murmur heard. Pulmonary/Chest: Effort normal and breath sounds normal. No respiratory distress. He has no wheezes. He has no rales. He exhibits no tenderness.  Purse lip breathing Mild exp wheeze diffuse ? Basal crackles  Abdominal: Soft. Bowel sounds are normal. He exhibits no distension and no mass. There is no tenderness. There is no rebound and no guarding.  Musculoskeletal: Normal range of motion. He exhibits no edema or tenderness.  Lymphadenopathy:    He has no cervical adenopathy.  Neurological: He is alert and oriented to person, place, and time. He has normal reflexes. No cranial nerve deficit. Coordination normal.  Skin: Skin is warm and dry. No rash noted. He is not diaphoretic. No erythema. No pallor.  Dry flaky skin  Psychiatric: He has a normal mood and affect. His behavior is normal. Judgment and thought content normal.  Nursing note and vitals reviewed.  Vitals:   05/17/17 1138  BP: 130/86  Pulse: 83  SpO2: 96%  Weight: 148 lb 6.4 oz (67.3 kg)  Height: 5\' 10"  (1.778 m)     Estimated body mass index is 21.29 kg/m as calculated from the following:   Height as of this encounter: 5\' 10"  (1.778 m).   Weight as of this encounter: 148 lb 6.4 oz (67.3 kg).     Assessment:       ICD-10-CM   1. SOB (shortness of breath) R06.02   2. Cough R05   3. Wheezing R06.2   4. Smoking history Z87.891        Plan:      Concern is that you have COPD  Plan -Try sample of Spiriva Respimat once daily -Do full pulmonary function test  Follow-up -Return in the next few to several weeks to see my nurse practitioner but after completion of the pulmonary function test -At this time we will assess response to Spiriva as well -Consider lung cancer screening program at follow-up and quit smoking counseling     Dr. Brand Males, M.D., [facility]P Pulmonary  and Critical Care Medicine Staff Physician [facility] Pager: [phone], If no answer or between  15:00h - 7:00h: call [phone]  05/17/2017 12:03 PM

## 2017-05-17 NOTE — Addendum Note (Signed)
Addended by: Mathis Bud on: 05/17/2017 12:33 PM   Modules accepted: Orders

## 2017-05-18 ENCOUNTER — Other Ambulatory Visit: Payer: Self-pay | Admitting: Physician Assistant

## 2017-05-18 ENCOUNTER — Other Ambulatory Visit (HOSPITAL_COMMUNITY): Payer: Self-pay

## 2017-05-18 NOTE — Telephone Encounter (Signed)
REFILL 

## 2017-05-18 NOTE — Progress Notes (Signed)
Paramedicine Encounter    Patient ID: JEANCLAUDE WENTWORTH, male    DOB: [birth year], 66 y.o.   MRN: [id number]    Patient Care Team: [name], MD as PCP - General (Internal Medicine)  Patient Active Problem List   Diagnosis Date Noted  . Noncompliance 04/23/2017  . Mitral regurgitation 04/23/2017  . Thrombus - possible apical thrombus 01/2017 04/23/2017  . Hyperlipidemia 04/23/2017  . Pulmonary hypertension ([place])   . SOB (shortness of breath)   . Palliative care by specialist   . Tobacco abuse 04/19/2017  . Acute on chronic systolic (congestive) heart failure ([place]) 04/18/2017  . Acute on chronic systolic CHF (congestive heart failure) ([place]) 04/06/2017  . Acute respiratory failure with hypoxia ([place]) 01/27/2017  . Hypertensive urgency 01/27/2017  . CHF (congestive heart failure) ([place]) 01/26/2017  . Acute combined systolic and diastolic HF (heart failure), NYHA class 4 ([place])   . CKD (chronic kidney disease), stage III ([place]) 01/04/2017  . Essential hypertension 01/04/2017  . Acute CHF (congestive heart failure) ([place]) 01/03/2017  . CVA (cerebral infarction) 12/12/2013  . Hyponatremia 12/06/2012  . Hiatal hernia 12/06/2012  . Syncope 12/06/2012  . Schizophrenia ([place])     Current Outpatient Medications:  .  aspirin 81 MG tablet, Take 1 tablet (81 mg total) by mouth daily., Disp: 30 tablet, Rfl: 1 .  atorvastatin (LIPITOR) 40 MG tablet, Take 1 tablet (40 mg total) by mouth every evening., Disp: 30 tablet, Rfl: 1 .  carvedilol (COREG) 3.125 MG tablet, Take 1 tablet (3.125 mg total) by mouth 2 (two) times daily with a meal., Disp: 60 tablet, Rfl: 1 .  furosemide (LASIX) 40 MG tablet, Take 1 tablet (40 mg total) by mouth 2 (two) times daily., Disp: 60 tablet, Rfl: 6 .  haloperidol decanoate (HALDOL DECANOATE) 100 MG/ML injection, Inject 100 mg into the muscle every 30 (thirty) days. , Disp: , Rfl: 99 .  isosorbide-hydrALAZINE (BIDIL) 20-37.5 MG tablet, Take 2 tablets by mouth 3 (three)  times daily., Disp: 180 tablet, Rfl: 1 .  losartan (COZAAR) 25 MG tablet, Take 1 tablet (25 mg total) by mouth daily., Disp: 30 tablet, Rfl: 1 .  potassium chloride SA (K-DUR,KLOR-CON) 20 MEQ tablet, Take 1 tablet (20 mEq total) by mouth daily., Disp: 30 tablet, Rfl: 1 .  spironolactone (ALDACTONE) 25 MG tablet, Take 1 tablet (25 mg total) by mouth daily., Disp: 30 tablet, Rfl: 0 .  acetaminophen (TYLENOL) 500 MG tablet, Take 1,000 mg by mouth every 6 (six) hours as needed for mild pain., Disp: , Rfl:  .  albuterol (PROVENTIL HFA;VENTOLIN HFA) 108 (90 Base) MCG/ACT inhaler, Inhale 1-2 puffs into the lungs every 6 (six) hours as needed for wheezing or shortness of breath., Disp: , Rfl:  .  Tiotropium Bromide Monohydrate (SPIRIVA RESPIMAT) 2.5 MCG/ACT AERS, Inhale 1 spray daily into the lungs., Disp: 1 Inhaler, Rfl: 0 Allergies  Allergen Reactions  . Sulfa Antibiotics Nausea Only     Social History   Socioeconomic History  . Marital status: Single    Spouse name: Not on file  . Number of children: Not on file  . Years of education: Not on file  . Highest education level: Not on file  Social Needs  . Financial resource strain: Not on file  . Food insecurity - worry: Not on file  . Food insecurity - inability: Not on file  . Transportation needs - medical: Not on file  . Transportation needs - non-medical: Not on file  Occupational History  .  Not on file  Tobacco Use  . Smoking status: Current Every Day Smoker    Packs/day: 2.00    Years: 40.00    Pack years: 80.00    Types: Cigarettes    Start date: 41  . Smokeless tobacco: Never Used  Substance and Sexual Activity  . Alcohol use: No  . Drug use: No  . Sexual activity: Not on file  Other Topics Concern  . Not on file  Social History Narrative  . Not on file    Physical Exam  Pulmonary/Chest: No respiratory distress. He has no wheezes. He has no rales.  Abdominal: He exhibits no distension. There is no tenderness. There  is no guarding.  Musculoskeletal: He exhibits no edema.  Skin: Skin is warm and dry. He is not diaphoretic.        Future Appointments  Date Time Provider [place]  06/01/2017 10:30 AM [facility] PA/NP [facility] None  06/22/2017 10:00 AM [facility] RM [facility] None  06/22/2017 11:00 AM Brand Males, MD [facility] None    ATF pt CAO x4 standing in the living room in front of the space heater.  The heating and cooling company is fixing the unit outside.  Pts oven is still broken but the top still works.  Pt denies increase in sob, he has a hx of some sob and is currently seeing a pulmonary physician.  He had an appointment yesterday and has a f/u on 12/19.  Pt has taken all of his meds this week and hasn't missed a dose. He saw his phychiarist last Friday to get his shot of haldol and "she asked him a bunch of questions".  Pt has no complaints today; pts vitals noted.  rx bottles verified and pill box refilled.    BP 116/80   Pulse 81   Resp 20   Wt 150 lb (68 kg)   SpO2 99%   BMI 21.52 kg/m   Weight yesterday-cant remmeber Last visit weight-150lb  **rx called ikn : klor con Spirolactone need aut Losartan Diltiazem Asa Atorvastatin Carvedilol bidil Furosemide    Duncan Alejandro, EMT Paramedic 05/18/2017    ACTION: Home visit completed

## 2017-05-24 ENCOUNTER — Other Ambulatory Visit (HOSPITAL_COMMUNITY): Payer: Self-pay

## 2017-05-24 NOTE — Progress Notes (Signed)
Paramedicine Encounter    Patient ID: Gerald Nelson, male    DOB: 03/02/1951, 66 y.o.   MRN: [id number]    Patient Care Team: [name], MD as PCP - General (Internal Medicine)  Patient Active Problem List   Diagnosis Date Noted  . Noncompliance 04/23/2017  . Mitral regurgitation 04/23/2017  . Thrombus - possible apical thrombus 01/2017 04/23/2017  . Hyperlipidemia 04/23/2017  . Pulmonary hypertension ([place])   . SOB (shortness of breath)   . Palliative care by specialist   . Tobacco abuse 04/19/2017  . Acute on chronic systolic (congestive) heart failure ([place]) 04/18/2017  . Acute on chronic systolic CHF (congestive heart failure) ([place]) 04/06/2017  . Acute respiratory failure with hypoxia ([place]) 01/27/2017  . Hypertensive urgency 01/27/2017  . CHF (congestive heart failure) ([place]) 01/26/2017  . Acute combined systolic and diastolic HF (heart failure), NYHA class 4 ([place])   . CKD (chronic kidney disease), stage III ([place]) 01/04/2017  . Essential hypertension 01/04/2017  . Acute CHF (congestive heart failure) ([place]) 01/03/2017  . CVA (cerebral infarction) 12/12/2013  . Hyponatremia 12/06/2012  . Hiatal hernia 12/06/2012  . Syncope 12/06/2012  . Schizophrenia ([place])     Current Outpatient Medications:  .  aspirin 81 MG tablet, Take 1 tablet (81 mg total) by mouth daily., Disp: 30 tablet, Rfl: 1 .  atorvastatin (LIPITOR) 40 MG tablet, Take 1 tablet (40 mg total) by mouth every evening., Disp: 30 tablet, Rfl: 1 .  carvedilol (COREG) 3.125 MG tablet, Take 1 tablet (3.125 mg total) by mouth 2 (two) times daily with a meal., Disp: 60 tablet, Rfl: 1 .  furosemide (LASIX) 40 MG tablet, Take 1 tablet (40 mg total) by mouth 2 (two) times daily., Disp: 60 tablet, Rfl: 6 .  isosorbide-hydrALAZINE (BIDIL) 20-37.5 MG tablet, Take 2 tablets by mouth 3 (three) times daily., Disp: 180 tablet, Rfl: 1 .  losartan (COZAAR) 25 MG tablet, Take 1 tablet (25 mg total) by mouth daily., Disp: 30 tablet, Rfl:  1 .  potassium chloride SA (K-DUR,KLOR-CON) 20 MEQ tablet, Take 1 tablet (20 mEq total) by mouth daily., Disp: 30 tablet, Rfl: 1 .  spironolactone (ALDACTONE) 25 MG tablet, TAKE 1 TABLET BY MOUTH EVERY DAY, Disp: 30 tablet, Rfl: 11 .  acetaminophen (TYLENOL) 500 MG tablet, Take 1,000 mg by mouth every 6 (six) hours as needed for mild pain., Disp: , Rfl:  .  albuterol (PROVENTIL HFA;VENTOLIN HFA) 108 (90 Base) MCG/ACT inhaler, Inhale 1-2 puffs into the lungs every 6 (six) hours as needed for wheezing or shortness of breath., Disp: , Rfl:  .  haloperidol decanoate (HALDOL DECANOATE) 100 MG/ML injection, Inject 100 mg into the muscle every 30 (thirty) days. , Disp: , Rfl: 99 .  Tiotropium Bromide Monohydrate (SPIRIVA RESPIMAT) 2.5 MCG/ACT AERS, Inhale 1 spray daily into the lungs., Disp: 1 Inhaler, Rfl: 0 Allergies  Allergen Reactions  . Sulfa Antibiotics Nausea Only     Social History   Socioeconomic History  . Marital status: Single    Spouse name: Not on file  . Number of children: Not on file  . Years of education: Not on file  . Highest education level: Not on file  Social Needs  . Financial resource strain: Not on file  . Food insecurity - worry: Not on file  . Food insecurity - inability: Not on file  . Transportation needs - medical: Not on file  . Transportation needs - non-medical: Not on file  Occupational History  .  Not on file  Tobacco Use  . Smoking status: Current Every Day Smoker    Packs/day: 2.00    Years: 40.00    Pack years: 80.00    Types: Cigarettes    Start date: 71  . Smokeless tobacco: Never Used  Substance and Sexual Activity  . Alcohol use: No  . Drug use: No  . Sexual activity: Not on file  Other Topics Concern  . Not on file  Social History Narrative  . Not on file    Physical Exam  Pulmonary/Chest: No respiratory distress. He has no wheezes. He has no rales.  Abdominal: He exhibits no distension. There is no tenderness. There is no  guarding.  Musculoskeletal: He exhibits no edema.  Skin: Skin is warm and dry. He is not diaphoretic.        Future Appointments  Date Time Provider [place]  06/01/2017 10:30 AM [facility] PA/NP [facility] None  06/22/2017 10:00 AM [facility] None  06/22/2017 11:00 AM Brand Males, MD [facility] None    ATF pt CAO x4 standing by the window.  Pt has taken all of his medications without missing any.  He continues to watch what he is eating.  He ate [place] legs and rice; with no sodium.  Pt's heat was fixed last week and the house was warm. Pt's pill box refilled and an extra one was put in his pill bag for next week.  BP 118/70 (BP Location: Right Arm, Patient Position: Sitting, Cuff Size: Normal)   Pulse 96   Wt 150 lb 3.2 oz (68.1 kg)   BMI 21.55 kg/m   Weight yesterday-150 Last visit weight-150   Gerald Nelson, EMT Paramedic 05/24/2017    ACTION: Home visit completed

## 2017-05-31 ENCOUNTER — Telehealth (HOSPITAL_COMMUNITY): Payer: Self-pay

## 2017-05-31 NOTE — Telephone Encounter (Signed)
CHF Clinic appointment reminder call placed to patient for upcoming post-hospital follow up.  Does understand purpose of this appointment and where CHF Clinic is located? Yes  How is patient feeling? Well, no copmplaints  Does patient have all of their medications since their recent discharge? Yes  Patient also reminded to take all medications as prescribed on the day of his/her appointment and to bring all medications to this appointment.  Advised to call our office for tardiness or cancellations/rescheduling needs.  Leory Plowman, Guinevere Ferrari

## 2017-06-01 ENCOUNTER — Other Ambulatory Visit (HOSPITAL_COMMUNITY): Payer: Self-pay

## 2017-06-01 ENCOUNTER — Ambulatory Visit (HOSPITAL_COMMUNITY)
Admission: RE | Admit: 2017-06-01 | Discharge: 2017-06-01 | Disposition: A | Discharge status: Home / Self Care | Payer: Medicare Other | Source: Ambulatory Visit | Attending: Internal Medicine | Admitting: Internal Medicine

## 2017-06-01 ENCOUNTER — Encounter (HOSPITAL_COMMUNITY): Payer: Self-pay

## 2017-06-01 VITALS — BP 148/82 | HR 83 | Wt 155.0 lb

## 2017-06-01 DIAGNOSIS — Z8673 Personal history of transient ischemic attack (TIA), and cerebral infarction without residual deficits: Secondary | ICD-10-CM | POA: Insufficient documentation

## 2017-06-01 DIAGNOSIS — I5022 Chronic systolic (congestive) heart failure: Secondary | ICD-10-CM | POA: Diagnosis not present

## 2017-06-01 DIAGNOSIS — I272 Pulmonary hypertension, unspecified: Secondary | ICD-10-CM | POA: Insufficient documentation

## 2017-06-01 DIAGNOSIS — Z8249 Family history of ischemic heart disease and other diseases of the circulatory system: Secondary | ICD-10-CM | POA: Insufficient documentation

## 2017-06-01 DIAGNOSIS — I700 Atherosclerosis of aorta: Secondary | ICD-10-CM | POA: Diagnosis not present

## 2017-06-01 DIAGNOSIS — I340 Nonrheumatic mitral (valve) insufficiency: Secondary | ICD-10-CM | POA: Diagnosis not present

## 2017-06-01 DIAGNOSIS — I10 Essential (primary) hypertension: Secondary | ICD-10-CM

## 2017-06-01 DIAGNOSIS — I8290 Acute embolism and thrombosis of unspecified vein: Secondary | ICD-10-CM

## 2017-06-01 DIAGNOSIS — F209 Schizophrenia, unspecified: Secondary | ICD-10-CM

## 2017-06-01 DIAGNOSIS — F17210 Nicotine dependence, cigarettes, uncomplicated: Secondary | ICD-10-CM | POA: Diagnosis not present

## 2017-06-01 DIAGNOSIS — N183 Chronic kidney disease, stage 3 unspecified: Secondary | ICD-10-CM

## 2017-06-01 DIAGNOSIS — Z882 Allergy status to sulfonamides status: Secondary | ICD-10-CM | POA: Diagnosis not present

## 2017-06-01 DIAGNOSIS — Z720 Tobacco use: Secondary | ICD-10-CM

## 2017-06-01 DIAGNOSIS — I130 Hypertensive heart and chronic kidney disease with heart failure and stage 1 through stage 4 chronic kidney disease, or unspecified chronic kidney disease: Secondary | ICD-10-CM | POA: Insufficient documentation

## 2017-06-01 DIAGNOSIS — E785 Hyperlipidemia, unspecified: Secondary | ICD-10-CM | POA: Diagnosis not present

## 2017-06-01 DIAGNOSIS — Z7982 Long term (current) use of aspirin: Secondary | ICD-10-CM | POA: Insufficient documentation

## 2017-06-01 DIAGNOSIS — J449 Chronic obstructive pulmonary disease, unspecified: Secondary | ICD-10-CM | POA: Diagnosis not present

## 2017-06-01 DIAGNOSIS — Z79899 Other long term (current) drug therapy: Secondary | ICD-10-CM | POA: Insufficient documentation

## 2017-06-01 LAB — BASIC METABOLIC PANEL
ANION GAP: 9 (ref 5–15)
BUN: 44 mg/dL — ABNORMAL HIGH (ref 6–20)
CALCIUM: 9.5 mg/dL (ref 8.9–10.3)
CO2: 25 mmol/L (ref 22–32)
CREATININE: 1.99 mg/dL — AB (ref 0.61–1.24)
Chloride: 100 mmol/L — ABNORMAL LOW (ref 101–111)
GFR calc Af Amer: 39 mL/min — ABNORMAL LOW (ref >60)
GFR calc non Af Amer: 34 mL/min — ABNORMAL LOW (ref >60)
Glucose, Bld: 83 mg/dL (ref 65–99)
POTASSIUM: 4.5 mmol/L (ref 3.5–5.1)
Sodium: 134 mmol/L — ABNORMAL LOW (ref 135–145)

## 2017-06-01 MED ORDER — CARVEDILOL 6.25 MG PO TABS: 6.2500 mg | ORAL_TABLET | Freq: Two times a day (BID) | ORAL | 6 refills | Status: DC

## 2017-06-01 NOTE — Progress Notes (Signed)
Advanced Heart Failure Medication Review by a Pharmacist  Does the patient  feel that his/her medications are working for him/her?  yes  Has the patient been experiencing any side effects to the medications prescribed?  no  Does the patient measure his/her own blood pressure or blood glucose at home?  Not asked at this visit    Does the patient have any problems obtaining medications due to transportation or finances?   No - [employer] insurance   Understanding of regimen: fair Understanding of indications: fair Potential of compliance: fair Patient understands to avoid NSAIDs. Patient understands to avoid decongestants.  Issues to address at subsequent visits: None   Pharmacist comments: Mr Dains is a 66 year old male presenting to clinic today with his medication bottles and Zack with paramedicine. This is Zack's first time with this patient. He reports taking his medications this morning and that he is adherent to his current regimen. He mentions he does not use tylenol for pain/headaches but takes 325mg  of aspirin. He should be advised to stop taking higher strength aspirin prn and take Tylenol instead. He had no medication related questions or concerns at this time.    Time with patient: 10 Preparation and documentation time: 2 Total time: 12

## 2017-06-01 NOTE — Progress Notes (Signed)
Paramedicine Encounter   Patient ID: Gerald Nelson , male,   DOB: 1951-05-17,66 y.o.,  MRN: [id number]   Met patient in clinic today with provider. He did not bring his pillbox to the appointment but did bring his bag of medications. He had taken all morning doses of his medications. He is changing pharmacy back to [facility] on [address]. Has Diltiazem in his med bag but it is not on the list in Epic. [name] is stopping diltiazem and increasing carvedilol to 6.25 mg BID. He reports smoking 1.5 packs per day. [name] is going to prescribe nicotine patches in hopes that his insurance.   Time spent with patient: 25 minutes  Jacquiline Doe, EMT 06/01/2017   ACTION: Next visit planned for 1 week

## 2017-06-01 NOTE — Progress Notes (Signed)
Advanced Heart Failure Clinic Note    Primary Care: Dr. Deforest Hoyles   Primary Cardiologist: Dr. Harrington Challenger, Dr. Aundra Dubin   HPI: Gerald Nelson is a 66 year old male with a past medical history of chronic systolic CHF (EF 75% in July 2018), severe MR/TR, pulmonary HTN, CVA, CKD, schizophrenia, HTN and tobacco abuse.   Gerald Nelson was admitted to [facility] 7/2-01/06/17 for newly diagnosed acute systolic CHF. Echo showed LVEF 15%, severe global hypokinesis, moderate LVH, coarsetrabeculation of the LV apex with numerous false tendinae of the left ventricle, very stagnant blood flow at the LV apex withsmoke but no obvious LV thrombus - Definity contrast was given,again, noting stagnant apical blood flow - this could suggestrecent thrombus and certainly high risk for apical thrombusformation. Other findings include aortic sclerosis with mild AI,moderate to severe MR, moderate LAE, mild RAE, moderate to severeTR, moderate to severe pulmonary hypertension (RVSP 73 mmHg), dilated IVC, trivial posterior pericardial effusion - tamponade physiology could be excluded given the degree of pulmonaryhypertension.He was seen by Dr. Harrington Challenger during this admission who felt that he needed a L/RHC, but patient left AMA. When he left, he did not pick up any medications due to lack of money and only continued to take ASA. He lives in a boarding house with two room mates and lives off of social security. He does have medicare. The only medication he takes regularly is haldol given to him by his psychiatrist.He has left the hospital several times before and has limited therapeutic options because of social and psych issues and compliance. He left on 10/3 and has stayed at a boarding house since then.  He left AMA 04/07/17. He was seen back in the office by Dr. Percival Spanish on 04/18/2017. Reported continued DOE, cough, and leg edema. He reported he had been taking meds but this could not be verified. It was not clear that he was compliant with sodium  restriction (eating pasta and ground beef). Prior discharge weights were variable, reportedly 159 in our office the day of his visit with Dr. Percival Spanish (with measured weight of 156 on 10/16). Due to concern for significant volume overload he was admitted for further evaluation.   He diuresed 25 pounds on IV lasix. He refused cath. His clonidine and diltazem were stopped during this admission. Referred to paramedicine. Discharge weight 133 pounds.   He presents today for regular follow up. Sometime in the interim between last visit he picked up coreg, spiro, and losartan. He has been feeling well. He continues to smoke 1.5 ppd. Trying to watch salt, but was recently told by his PCP to increase his fluid intake for his kidney function. Paramedicine following.  Denies orthopnea or PND. Taking lasix as directed now.   Review of systems complete and found to be negative unless listed in HPI.    Past Medical History:  Diagnosis Date  . Apical mural thrombus    a. question of apical thrombus on echo 01/2017, pt left AMA, not felt to be anticoag candidate with noncompliance.  . Arthritis   . CHF (congestive heart failure) ([place])   . Chronic systolic CHF (congestive heart failure) ([place])    a. pt refused cath. EF 15% 01/2017.  . CKD (chronic kidney disease), stage III ([place])   . COPD (chronic obstructive pulmonary disease) ([place])   . Hernia, hiatal   . Hyperlipidemia   . Hypertension   . Hyponatremia   . Mitral regurgitation    a. mod-severe by echo 01/2017.  . Pulmonary hypertension ([place])   .  Schizophrenia ([place])   . Tobacco abuse     Current Outpatient Medications  Medication Sig Dispense Refill  . albuterol (PROVENTIL HFA;VENTOLIN HFA) 108 (90 Base) MCG/ACT inhaler Inhale 1-2 puffs into the lungs every 6 (six) hours as needed for wheezing or shortness of breath.    Marland Kitchen aspirin 81 MG tablet Take 1 tablet (81 mg total) by mouth daily. 30 tablet 1  . atorvastatin (LIPITOR) 40 MG tablet Take 1 tablet (40  mg total) by mouth every evening. 30 tablet 1  . carvedilol (COREG) 3.125 MG tablet Take 1 tablet (3.125 mg total) by mouth 2 (two) times daily with a meal. 60 tablet 1  . furosemide (LASIX) 40 MG tablet Take 1 tablet (40 mg total) by mouth 2 (two) times daily. 60 tablet 6  . haloperidol decanoate (HALDOL DECANOATE) 100 MG/ML injection Inject 100 mg into the muscle every 30 (thirty) days.   99  . isosorbide-hydrALAZINE (BIDIL) 20-37.5 MG tablet Take 2 tablets by mouth 3 (three) times daily. 180 tablet 1  . losartan (COZAAR) 25 MG tablet Take 1 tablet (25 mg total) by mouth daily. 30 tablet 1  . potassium chloride SA (K-DUR,KLOR-CON) 20 MEQ tablet Take 1 tablet (20 mEq total) by mouth daily. 30 tablet 1  . spironolactone (ALDACTONE) 25 MG tablet TAKE 1 TABLET BY MOUTH EVERY DAY 30 tablet 11  . acetaminophen (TYLENOL) 500 MG tablet Take 1,000 mg by mouth every 6 (six) hours as needed for mild pain.    Marland Kitchen diltiazem (CARDIZEM CD) 240 MG 24 hr capsule Take 240 mg by mouth every evening.  11  . Tiotropium Bromide Monohydrate (SPIRIVA RESPIMAT) 2.5 MCG/ACT AERS Inhale 1 spray daily into the lungs. (Patient not taking: Reported on 06/01/2017) 1 Inhaler 0   No current facility-administered medications for this encounter.     Allergies  Allergen Reactions  . Sulfa Antibiotics Nausea Only      Social History   Socioeconomic History  . Marital status: Single    Spouse name: Not on file  . Number of children: Not on file  . Years of education: Not on file  . Highest education level: Not on file  Social Needs  . Financial resource strain: Not on file  . Food insecurity - worry: Not on file  . Food insecurity - inability: Not on file  . Transportation needs - medical: Not on file  . Transportation needs - non-medical: Not on file  Occupational History  . Not on file  Tobacco Use  . Smoking status: Current Every Day Smoker    Packs/day: 2.00    Years: 40.00    Pack years: 80.00    Types:  Cigarettes    Start date: 47  . Smokeless tobacco: Never Used  Substance and Sexual Activity  . Alcohol use: No  . Drug use: No  . Sexual activity: Not on file  Other Topics Concern  . Not on file  Social History Narrative  . Not on file      Family History  Problem Relation Age of Onset  . Hypertension Mother   . Hypertension Father    Vitals:   06/01/17 1037  BP: (!) 148/82  Pulse: 83  SpO2: 97%  Weight: 155 lb (70.3 kg)   Wt Readings from Last 3 Encounters:  06/01/17 155 lb (70.3 kg)  05/24/17 150 lb 3.2 oz (68.1 kg)  05/18/17 150 lb (68 kg)   PHYSICAL EXAM: General: Elderly appearing. No resp difficulty. Smells strongly of  tobacco HEENT: Normal Neck: Supple. JVP 6-7 cm. Carotids 2+ bilat; no bruits. No thyromegaly or nodule noted. Cor: PMI nondisplaced. RRR, 2/6 SEM Lungs: CTAB, normal effort. Abdomen: Soft, non-tender, non-distended, no HSM. No bruits or masses. +BS  Extremities: No cyanosis, clubbing, or rash. Trace ankle edema at most.  Neuro: Alert & orientedx3, cranial nerves grossly intact. moves all 4 extremities w/o difficulty. Affect pleasant   ASSESSMENT & PLAN: 1. Chronic systolic CHF: Echo 03/3234 EF 15%.  - Continues to refuse ischemic work up.  - NYHA II-III - Volume elevated stable on exam.  - Continue lasix 40 mg BID.  - Increase coreg to 6.25 mg BID.  - Continue losartan 25 mg daily.  - Continue spiro 25 mg daily.  - Diltiazem is not ideal for him with low EF. Will stop.  - Reinforced fluid restriction to < 2 L daily, sodium restriction to less than 2000 mg daily, and the importance of daily weights.  2. Questionable apical thrombus:  - Poor anticoagulation candidate. No change.   3. MR - Mod/Sev on last echo. Suspect functional. No change. Refuses cath.   4. Tobacco abuse - Currently smokes > 1 ppd. Encouraged complete cessation.   5. CKD III:  - BMET today.   6. SCHIZOPHRENIA:  - Follows with PCP.   7. HTN -  Elevated on arrival, but was in a hurry and rushing. Has been stable 110-120s at home by paramedicine - Meds as above.   8. COPD - Stable. Encouraged complete cessation.   Meds as above. BMET today. RTC 6 weeks.   Shirley Friar, PA-C 06/01/17   Greater than 50% of the 25 minute visit was spent in counseling/coordination of care regarding disease state education, salt/fluid restriction, sliding scale diuretics, and medication compliance.

## 2017-06-01 NOTE — Patient Instructions (Addendum)
Routine lab work today. Will notify you of abnormal results, otherwise no news is good news!  STOP Cardizem/Diltiazem.  STOP Aspirin 325 mg.  INCREASE Carvedilol (Coreg) to 6.25 mg twice daily. May double up on your current 3.125 mg tablets you have at home (Take 2 tabs twice daily). New Rx has been sent in for 6.25 mg tablets (Take 1 tab twice daily).  Follow up 6 weeks with Oda Kilts PA-C.  Take all medication as prescribed the day of your appointment. Bring all medications with you to your appointment.  Do the following things EVERYDAY: 1) Weigh yourself in the morning before breakfast. Write it down and keep it in a log. 2) Take your medicines as prescribed 3) Eat low salt foods-Limit salt (sodium) to 2000 mg per day.  4) Stay as active as you can everyday 5) Limit all fluids for the day to less than 2 liters

## 2017-06-09 ENCOUNTER — Other Ambulatory Visit: Payer: Self-pay | Admitting: Physician Assistant

## 2017-06-09 ENCOUNTER — Encounter (HOSPITAL_COMMUNITY): Payer: Self-pay

## 2017-06-09 ENCOUNTER — Other Ambulatory Visit (HOSPITAL_COMMUNITY): Payer: Self-pay

## 2017-06-09 NOTE — Telephone Encounter (Signed)
This is a CHF pt 

## 2017-06-09 NOTE — Progress Notes (Signed)
Paramedicine Encounter    Patient ID: Gerald Nelson, male    DOB: 12/08/1950, 66 y.o.   MRN: [id number]    Patient Care Team: [name], MD as PCP - General (Internal Medicine)  Patient Active Problem List   Diagnosis Date Noted  . Noncompliance 04/23/2017  . Mitral regurgitation 04/23/2017  . Thrombus - possible apical thrombus 01/2017 04/23/2017  . Hyperlipidemia 04/23/2017  . Pulmonary hypertension ([place])   . SOB (shortness of breath)   . Palliative care by specialist   . Tobacco abuse 04/19/2017  . Acute respiratory failure with hypoxia ([place]) 01/27/2017  . Hypertensive urgency 01/27/2017  . CHF (congestive heart failure) ([place]) 01/26/2017  . CKD (chronic kidney disease), stage III ([place]) 01/04/2017  . Essential hypertension 01/04/2017  . CVA (cerebral infarction) 12/12/2013  . Hyponatremia 12/06/2012  . Hiatal hernia 12/06/2012  . Syncope 12/06/2012  . Schizophrenia ([place])     Current Outpatient Medications:  .  aspirin 81 MG tablet, Take 1 tablet (81 mg total) by mouth daily., Disp: 30 tablet, Rfl: 1 .  atorvastatin (LIPITOR) 40 MG tablet, Take 1 tablet (40 mg total) by mouth every evening., Disp: 30 tablet, Rfl: 1 .  carvedilol (COREG) 6.25 MG tablet, Take 1 tablet (6.25 mg total) by mouth 2 (two) times daily with a meal., Disp: 60 tablet, Rfl: 6 .  furosemide (LASIX) 40 MG tablet, Take 1 tablet (40 mg total) by mouth 2 (two) times daily., Disp: 60 tablet, Rfl: 6 .  haloperidol decanoate (HALDOL DECANOATE) 100 MG/ML injection, Inject 100 mg into the muscle every 30 (thirty) days. , Disp: , Rfl: 99 .  isosorbide-hydrALAZINE (BIDIL) 20-37.5 MG tablet, Take 2 tablets by mouth 3 (three) times daily., Disp: 180 tablet, Rfl: 1 .  losartan (COZAAR) 25 MG tablet, Take 1 tablet (25 mg total) by mouth daily., Disp: 30 tablet, Rfl: 1 .  potassium chloride SA (K-DUR,KLOR-CON) 20 MEQ tablet, Take 1 tablet (20 mEq total) by mouth daily., Disp: 30 tablet, Rfl: 1 .  spironolactone  (ALDACTONE) 25 MG tablet, TAKE 1 TABLET BY MOUTH EVERY DAY, Disp: 30 tablet, Rfl: 11 .  albuterol (PROVENTIL HFA;VENTOLIN HFA) 108 (90 Base) MCG/ACT inhaler, Inhale 1-2 puffs into the lungs every 6 (six) hours as needed for wheezing or shortness of breath., Disp: , Rfl:  .  Tiotropium Bromide Monohydrate (SPIRIVA RESPIMAT) 2.5 MCG/ACT AERS, Inhale 1 spray daily into the lungs. (Patient not taking: Reported on 06/01/2017), Disp: 1 Inhaler, Rfl: 0 Allergies  Allergen Reactions  . Sulfa Antibiotics Nausea Only     Social History   Socioeconomic History  . Marital status: Single    Spouse name: Not on file  . Number of children: Not on file  . Years of education: Not on file  . Highest education level: Not on file  Social Needs  . Financial resource strain: Not on file  . Food insecurity - worry: Not on file  . Food insecurity - inability: Not on file  . Transportation needs - medical: Not on file  . Transportation needs - non-medical: Not on file  Occupational History  . Not on file  Tobacco Use  . Smoking status: Current Every Day Smoker    Packs/day: 2.00    Years: 40.00    Pack years: 80.00    Types: Cigarettes    Start date: 55  . Smokeless tobacco: Never Used  Substance and Sexual Activity  . Alcohol use: No  . Drug use: No  . Sexual activity:  Not on file  Other Topics Concern  . Not on file  Social History Narrative  . Not on file    Physical Exam  Pulmonary/Chest: No respiratory distress. He has no wheezes. He has no rales.  Abdominal: He exhibits no distension. There is no tenderness. There is no guarding.  Musculoskeletal: He exhibits no edema.  Skin: Skin is warm and dry. He is not diaphoretic.        Future Appointments  Date Time Provider [place]  06/22/2017 10:00 AM [facility] RM [facility] None  06/22/2017 11:00 AM Brand Males, MD [facility] None  07/13/2017 11:00 AM [facility] PA/NP [facility] None    ATF pt CAO x4 standing in the  livingroom with no complaints.  He stated that he missed his appointment yesterday to get his haldol shot.  Pt stated that [place] the city bus, but he rescheduled for tomorrow.  Pt has taken all of his medications and didn't miss a dose.  Pt denies sob, dizziness and chest pain.  Pt hasn't had to add pillows to sleep at night.  He stated that he can breathe a lot better than he did before.  rx bottles verified and pill box refilled.    BP (!) 130/98 (BP Location: Left Arm, Patient Position: Sitting, Cuff Size: Normal)   Pulse 93   Resp 16   Wt 152 lb 12.8 oz (69.3 kg)   SpO2 98%   BMI 21.92 kg/m   **rx called in: losartatin (need approval; pharmacy predicts it'll be ready Tuesday)  Weight yesterday-152 Last visit weight-150    Murlean Seelye, EMT Paramedic 06/09/2017    ACTION: Home visit completed Next visit planned for next wed

## 2017-06-13 ENCOUNTER — Other Ambulatory Visit: Payer: Self-pay | Admitting: Physician Assistant

## 2017-06-16 ENCOUNTER — Encounter (HOSPITAL_COMMUNITY): Payer: Self-pay

## 2017-06-16 ENCOUNTER — Other Ambulatory Visit (HOSPITAL_COMMUNITY): Payer: Self-pay

## 2017-06-16 NOTE — Progress Notes (Signed)
Paramedicine Encounter    Patient ID: Gerald Nelson, male    DOB: 23-Jul-1950, 66 y.o.   MRN: [id number]    Patient Care Team: [name], MD as PCP - General (Internal Medicine)  Patient Active Problem List   Diagnosis Date Noted  . Noncompliance 04/23/2017  . Mitral regurgitation 04/23/2017  . Thrombus - possible apical thrombus 01/2017 04/23/2017  . Hyperlipidemia 04/23/2017  . Pulmonary hypertension ([place])   . SOB (shortness of breath)   . Palliative care by specialist   . Tobacco abuse 04/19/2017  . Acute respiratory failure with hypoxia ([place]) 01/27/2017  . Hypertensive urgency 01/27/2017  . CHF (congestive heart failure) ([place]) 01/26/2017  . CKD (chronic kidney disease), stage III ([place]) 01/04/2017  . Essential hypertension 01/04/2017  . CVA (cerebral infarction) 12/12/2013  . Hyponatremia 12/06/2012  . Hiatal hernia 12/06/2012  . Syncope 12/06/2012  . Schizophrenia ([place])     Current Outpatient Medications:  .  aspirin 81 MG EC tablet, TAKE 1 TABLET BY MOUTH EVERY DAY, Disp: 30 tablet, Rfl: 1 .  atorvastatin (LIPITOR) 40 MG tablet, TAKE 1 TABLET BY MOUTH EVERY DAY IN THE EVENING, Disp: 30 tablet, Rfl: 1 .  BIDIL 20-37.5 MG tablet, TAKE 2 TABLETS BY MOUTH 3 (THREE) TIMES DAILY., Disp: 180 tablet, Rfl: 1 .  carvedilol (COREG) 6.25 MG tablet, Take 1 tablet (6.25 mg total) by mouth 2 (two) times daily with a meal., Disp: 60 tablet, Rfl: 6 .  furosemide (LASIX) 40 MG tablet, Take 1 tablet (40 mg total) by mouth 2 (two) times daily., Disp: 60 tablet, Rfl: 6 .  haloperidol decanoate (HALDOL DECANOATE) 100 MG/ML injection, Inject 100 mg into the muscle every 30 (thirty) days. , Disp: , Rfl: 99 .  KLOR-CON M20 20 MEQ tablet, TAKE 1 TABLET BY MOUTH EVERY DAY, Disp: 30 tablet, Rfl: 1 .  losartan (COZAAR) 25 MG tablet, TAKE 1 TABLET BY MOUTH EVERY DAY, Disp: 30 tablet, Rfl: 1 .  spironolactone (ALDACTONE) 25 MG tablet, TAKE 1 TABLET BY MOUTH EVERY DAY, Disp: 30 tablet, Rfl: 11 .   albuterol (PROVENTIL HFA;VENTOLIN HFA) 108 (90 Base) MCG/ACT inhaler, Inhale 1-2 puffs into the lungs every 6 (six) hours as needed for wheezing or shortness of breath., Disp: , Rfl:  .  carvedilol (COREG) 3.125 MG tablet, Take 2 tablets (6.25 mg total) by mouth 2 (two) times daily with a meal., Disp: 120 tablet, Rfl: 1 .  Tiotropium Bromide Monohydrate (SPIRIVA RESPIMAT) 2.5 MCG/ACT AERS, Inhale 1 spray daily into the lungs. (Patient not taking: Reported on 06/01/2017), Disp: 1 Inhaler, Rfl: 0 Allergies  Allergen Reactions  . Sulfa Antibiotics Nausea Only     Social History   Socioeconomic History  . Marital status: Single    Spouse name: Not on file  . Number of children: Not on file  . Years of education: Not on file  . Highest education level: Not on file  Social Needs  . Financial resource strain: Not on file  . Food insecurity - worry: Not on file  . Food insecurity - inability: Not on file  . Transportation needs - medical: Not on file  . Transportation needs - non-medical: Not on file  Occupational History  . Not on file  Tobacco Use  . Smoking status: Current Every Day Smoker    Packs/day: 2.00    Years: 40.00    Pack years: 80.00    Types: Cigarettes    Start date: 20  . Smokeless tobacco: Never Used  Substance and Sexual Activity  . Alcohol use: No  . Drug use: No  . Sexual activity: Not on file  Other Topics Concern  . Not on file  Social History Narrative  . Not on file    Physical Exam  Pulmonary/Chest: No respiratory distress. He has no wheezes. He has no rales.  Abdominal: He exhibits no distension. There is no tenderness. There is no rebound.  Musculoskeletal: He exhibits no edema.  Skin: Skin is warm and dry. He is not diaphoretic.        Future Appointments  Date Time Provider [place]  06/22/2017 10:00 AM [facility] None  06/22/2017 11:00 AM Brand Males, MD [facility] None  07/13/2017 11:00 AM [facility] PA/NP  [facility] None    ATF pt CAO x4 standing in the kitchen.  Pt stated that he did go to his pcp yesterday and there were no changes to his meds.  Pt denies sob, dizziness and chest pain; he also denies needing additional pillows to sleep.  Pt tries to watch what he eats but due to his limited income he struggles with food choices. Pt has taken all of his meds without difficulty.  rx bottles verified and pill box refilled.  Request nicatin patch **rx called in: losaratin [place] potassium  BP 104/68 (BP Location: Right Arm, Patient Position: Sitting, Cuff Size: Normal)   Pulse 90   Resp 16   Wt 154 lb (69.9 kg)   SpO2 96%   BMI 22.10 kg/m   Weight yesterday-didn't weigh Last visit weight-152    Chaney Ingram, EMT Paramedic 06/16/2017    ACTION: Home visit completed

## 2017-06-22 ENCOUNTER — Encounter: Payer: Self-pay | Admitting: Internal Medicine

## 2017-06-22 ENCOUNTER — Encounter (HOSPITAL_COMMUNITY): Payer: Self-pay

## 2017-06-22 ENCOUNTER — Other Ambulatory Visit: Payer: Medicare Other

## 2017-06-22 ENCOUNTER — Ambulatory Visit (INDEPENDENT_AMBULATORY_CARE_PROVIDER_SITE_OTHER): Payer: Medicare Other | Admitting: Internal Medicine

## 2017-06-22 ENCOUNTER — Other Ambulatory Visit (HOSPITAL_COMMUNITY): Payer: Self-pay

## 2017-06-22 VITALS — BP 126/66 | HR 68 | Ht 70.0 in | Wt 158.0 lb

## 2017-06-22 DIAGNOSIS — Z87891 Personal history of nicotine dependence: Secondary | ICD-10-CM

## 2017-06-22 DIAGNOSIS — Z23 Encounter for immunization: Secondary | ICD-10-CM

## 2017-06-22 DIAGNOSIS — Z129 Encounter for screening for malignant neoplasm, site unspecified: Secondary | ICD-10-CM

## 2017-06-22 DIAGNOSIS — R05 Cough: Secondary | ICD-10-CM

## 2017-06-22 DIAGNOSIS — J449 Chronic obstructive pulmonary disease, unspecified: Secondary | ICD-10-CM

## 2017-06-22 DIAGNOSIS — R062 Wheezing: Secondary | ICD-10-CM

## 2017-06-22 DIAGNOSIS — R0602 Shortness of breath: Secondary | ICD-10-CM | POA: Diagnosis not present

## 2017-06-22 DIAGNOSIS — R059 Cough, unspecified: Secondary | ICD-10-CM

## 2017-06-22 LAB — PULMONARY FUNCTION TEST
DL/VA % pred: 69 %
DL/VA: 3.06 ml/min/mmHg/L
DLCO COR % PRED: 44 %
DLCO UNC % PRED: 43 %
DLCO UNC: 12.22 ml/min/mmHg
DLCO cor: 12.55 ml/min/mmHg
FEF 25-75 POST: 0.66 L/s
FEF 25-75 PRE: 0.36 L/s
FEF2575-%Change-Post: 81 %
FEF2575-%PRED-POST: 26 %
FEF2575-%Pred-Pre: 14 %
FEV1-%Change-Post: 27 %
FEV1-%PRED-POST: 41 %
FEV1-%Pred-Pre: 32 %
FEV1-POST: 1.12 L
FEV1-PRE: 0.88 L
FEV1FVC-%Change-Post: 3 %
FEV1FVC-%PRED-PRE: 64 %
FEV6-%CHANGE-POST: 21 %
FEV6-%PRED-POST: 62 %
FEV6-%Pred-Pre: 51 %
FEV6-Post: 2.1 L
FEV6-Pre: 1.73 L
FEV6FVC-%CHANGE-POST: -1 %
FEV6FVC-%Pred-Post: 100 %
FEV6FVC-%Pred-Pre: 101 %
FVC-%Change-Post: 22 %
FVC-%Pred-Post: 62 %
FVC-%Pred-Pre: 50 %
FVC-Post: 2.18 L
FVC-Pre: 1.77 L
POST FEV1/FVC RATIO: 51 %
Post FEV6/FVC ratio: 96 %
Pre FEV1/FVC ratio: 49 %
Pre FEV6/FVC Ratio: 97 %
RV % PRED: 223 %
RV: 4.85 L
TLC % pred: 106 %
TLC: 6.83 L

## 2017-06-22 MED ORDER — BUDESONIDE-FORMOTEROL FUMARATE 160-4.5 MCG/ACT IN AERO: 2.0000 | INHALATION_SPRAY | Freq: Two times a day (BID) | RESPIRATORY_TRACT | 6 refills | Status: DC

## 2017-06-22 NOTE — Patient Instructions (Addendum)
    ICD-10-CM   1. Stage 3 severe COPD by GOLD classification ([place]) J44.9   2. History of smoking 30 or more pack years Z87.891   3. Cancer screening Z12.9     COPD: diaagnosis is severe copd.   - continue spiriva and glad is helping you  - start symbicort 80/4.5 , 2 puff twice daily  - prevnar 06/22/2017   - alpha 1 AT check 06/22/2017  - in future will discuss pulmonary rehab  Smoking: try to quit on own for now  Cancer screen: refer Ms Thurman Coyer - lung cancer screening coordinator   Followup 3 months or sooner if needed

## 2017-06-22 NOTE — Progress Notes (Signed)
Paramedicine Encounter    Patient ID: Gerald Nelson, male    DOB: June 01, 1951, 66 y.o.   MRN: [id number]    Patient Care Team: [name], MD as PCP - General (Internal Medicine)  Patient Active Problem List   Diagnosis Date Noted  . Noncompliance 04/23/2017  . Mitral regurgitation 04/23/2017  . Thrombus - possible apical thrombus 01/2017 04/23/2017  . Hyperlipidemia 04/23/2017  . Pulmonary hypertension ([place])   . SOB (shortness of breath)   . Palliative care by specialist   . Tobacco abuse 04/19/2017  . Acute respiratory failure with hypoxia ([place]) 01/27/2017  . Hypertensive urgency 01/27/2017  . CHF (congestive heart failure) ([place]) 01/26/2017  . CKD (chronic kidney disease), stage III ([place]) 01/04/2017  . Essential hypertension 01/04/2017  . CVA (cerebral infarction) 12/12/2013  . Hyponatremia 12/06/2012  . Hiatal hernia 12/06/2012  . Syncope 12/06/2012  . Schizophrenia ([place])     Current Outpatient Medications:  .  aspirin 81 MG EC tablet, TAKE 1 TABLET BY MOUTH EVERY DAY, Disp: 30 tablet, Rfl: 1 .  atorvastatin (LIPITOR) 40 MG tablet, TAKE 1 TABLET BY MOUTH EVERY DAY IN THE EVENING, Disp: 30 tablet, Rfl: 1 .  BIDIL 20-37.5 MG tablet, TAKE 2 TABLETS BY MOUTH 3 (THREE) TIMES DAILY., Disp: 180 tablet, Rfl: 1 .  carvedilol (COREG) 3.125 MG tablet, Take 2 tablets (6.25 mg total) by mouth 2 (two) times daily with a meal., Disp: 120 tablet, Rfl: 1 .  furosemide (LASIX) 40 MG tablet, Take 1 tablet (40 mg total) by mouth 2 (two) times daily., Disp: 60 tablet, Rfl: 6 .  KLOR-CON M20 20 MEQ tablet, TAKE 1 TABLET BY MOUTH EVERY DAY, Disp: 30 tablet, Rfl: 1 .  losartan (COZAAR) 25 MG tablet, TAKE 1 TABLET BY MOUTH EVERY DAY, Disp: 30 tablet, Rfl: 1 .  spironolactone (ALDACTONE) 25 MG tablet, TAKE 1 TABLET BY MOUTH EVERY DAY, Disp: 30 tablet, Rfl: 11 .  albuterol (PROVENTIL HFA;VENTOLIN HFA) 108 (90 Base) MCG/ACT inhaler, Inhale 1-2 puffs into the lungs every 6 (six) hours as needed for  wheezing or shortness of breath., Disp: , Rfl:  .  budesonide-formoterol (SYMBICORT) 160-4.5 MCG/ACT inhaler, Inhale 2 puffs into the lungs 2 (two) times daily., Disp: 1 Inhaler, Rfl: 6 .  carvedilol (COREG) 6.25 MG tablet, Take 1 tablet (6.25 mg total) by mouth 2 (two) times daily with a meal., Disp: 60 tablet, Rfl: 6 .  haloperidol decanoate (HALDOL DECANOATE) 100 MG/ML injection, Inject 100 mg into the muscle every 30 (thirty) days. , Disp: , Rfl: 99 .  Tiotropium Bromide Monohydrate (SPIRIVA RESPIMAT) 2.5 MCG/ACT AERS, Inhale 1 spray daily into the lungs., Disp: 1 Inhaler, Rfl: 0 Allergies  Allergen Reactions  . Sulfa Antibiotics Nausea Only     Social History   Socioeconomic History  . Marital status: Single    Spouse name: Not on file  . Number of children: Not on file  . Years of education: Not on file  . Highest education level: Not on file  Social Needs  . Financial resource strain: Not on file  . Food insecurity - worry: Not on file  . Food insecurity - inability: Not on file  . Transportation needs - medical: Not on file  . Transportation needs - non-medical: Not on file  Occupational History  . Not on file  Tobacco Use  . Smoking status: Current Every Day Smoker    Packs/day: 1.00    Years: 40.00    Pack years: 40.00  Types: Cigarettes    Start date: 43  . Smokeless tobacco: Never Used  Substance and Sexual Activity  . Alcohol use: No  . Drug use: No  . Sexual activity: Not on file  Other Topics Concern  . Not on file  Social History Narrative  . Not on file    Physical Exam  Pulmonary/Chest: No respiratory distress. He has no wheezes. He has no rales.  Abdominal: He exhibits no distension. There is no tenderness. There is no guarding.  Musculoskeletal: He exhibits no edema.  Skin: Skin is warm and dry. He is not diaphoretic.        Future Appointments  Date Time Provider [place]  07/13/2017 11:00 AM [facility] PA/NP [facility] None     ATF pt CAO x4 standing in the living room. He stated that he just got back from seeing the [facility] whom told hm that his lungs is working at [employer].  Pt stated that he was not recommended for oxygen but he would still like to get a nicatin patch.  He stated that he doesn't get sob while walking around for long periods of time now.  He stated that it hasn't been a problem for a long time.  Pt has taken all of his medications for the week.  Pt denies sob, dizziness and chest pain.  Pt hasn't needed to add pillows to sleep and he's still doing his normal activities. Pt stated that he's looking forward to his SSI amount increase that he is supposed to get next month.  He stated that he can only afford a little food, bus passes and medications "right now".  rx bottles verified and pill box refilled.   BP 122/78 (BP Location: Left Arm)   Pulse 100   Resp 16   Wt 158 lb (71.7 kg)   SpO2 99%   BMI 22.67 kg/m   Weight yesterday-didn't weigh Last visit weight-154  **rx called in: Losartan Asa Atorvastatin klor-con   Shedric Fredericks, EMT Paramedic 06/23/2017    ACTION: Home visit completed Next visit planned for next week

## 2017-06-22 NOTE — Progress Notes (Signed)
PFT completed today 06/22/17

## 2017-06-22 NOTE — Progress Notes (Signed)
Subjective:     Patient ID: Gerald Nelson, male   DOB: May 10, 1951, 66 y.o.   MRN: [id number]  HPI   PCP [name], MD  HPI  IOV 05/17/2017  Chief Complaint  Patient presents with  . Advice Only    Referred by Dr. Deforest Hoyles due to COPD.  Pt has complaints of SOB, cough with white mucus. Denies any CP.    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  He has been referred by Dr. Deforest Hoyles for question of COPD according to patient complaint.  Patient himself states that he has only been short of breath for the last 1 year.  It is insidious onset.  Associated with cough and wheezing.  In July 2018 did get diagnosed with systolic heart failure and he says "a lot of fluid was pulled out" and he lost weight.  After this his symptoms have improved significantly although he still does have some residual shortness of breath cough and wheezing.  The shortness of breath is mild present only with exertion.  In fact when we walked him in the office today he did not desaturate not complain of shortness of breath.  She did have some wheezing on exam.  He continues to smoke 2 packs a day.  He started smoking  in 1974  20 years  Personal visualization of imaging: October 2018 chest x-ray shows cardiomegaly and some pulmonary congestion from his heart failure.  2014 and 2015 CT abdomen lung cuts in the base does not show any evidence of ILD.  Walking desaturation test on 05/17/2017 185 feet x 3 laps:  did NOT desaturate. Rest pulse ox was 100%, final pulse ox was 100%. HR response 84/min at rest to 93/min at peak exertion.  Echocardiogram January 04, 2017: Ejection fraction 15%   OV 06/22/2017  Chief Complaint  Patient presents with  . Follow-up    follow up for PFT   Gerald Nelson 1951-02-22 66 y.o. male [address] [place] [address] presents for follow-up for symptoms of smoking history associated with cough and wheezing.  I put him on Spiriva  for empiric diagnosis of COPD.  He tells me that with this cough wheezing and shortness of breath is significantly improved.  He did have pulmonary function test today documented below it shows severe Gold stage III COPD with significant bronchodilator response.  At this point in time he feels well.  His COPD CAT score today on Spiriva as documented below.  Immunization review shows that he has had his flu shot but will benefit from pneumonia vaccine     CAT COPD Symptom & Quality of Life Score ([place]) 0 is no burden. 5 is highest burden 06/22/2017 On spiriva. Will   Never Cough -> Cough all the time   No phlegm in chest -> Chest is full of phlegm   No chest tightness -> Chest feels very tight   No dyspnea for 1 flight stairs/hill -> Very dyspneic for 1 flight of stairs   No limitations for ADL at home -> Very limited with ADL at home   Confident leaving home -> Not at all confident leaving home   Sleep soundly -> Do not sleep soundly because of lung condition   Lots of Energy -> No energy at all   TOTAL Score (max 40)          Results for Gerald, Nelson (MRN [id number]) as of 06/22/2017 11:49  Ref.  Range 06/22/2017 09:40  FEV1-Post Latest Units: L 1.12/41%  FEV1-%Change-Post Latest Units: % 27  Post FEV1/FVC ratio Latest Units: % 51  dlco ```  12.22/43%      has a past medical history of Apical mural thrombus, Arthritis, CHF (congestive heart failure) (HCC), Chronic systolic CHF (congestive heart failure) ([place]), CKD (chronic kidney disease), stage III (H[facility]), COPD (chronic obstructive pulmonary disease) (H[facility]), Hernia, hiatal, Hyperlipidemia, Hypertension, Hyponatremia, Mitral regurgitation, Pulmonary hypertension ([place]), Schizophrenia ([place]), and Tobacco abuse.   reports that he has been smoking cigarettes.  He started smoking about 44 years ago. He has a 40.00 pack-year smoking history. he has never used smokeless tobacco.  Past Surgical History:  Procedure Laterality Date   . PENILE PROSTHESIS IMPLANT      Allergies  Allergen Reactions  . Sulfa Antibiotics Nausea Only    Immunization History  Administered Date(s) Administered  . Influenza, High Dose Seasonal PF 04/16/2017    Family History  Problem Relation Age of Onset  . Hypertension Mother   . Hypertension Father      Current Outpatient Medications:  .  albuterol (PROVENTIL HFA;VENTOLIN HFA) 108 (90 Base) MCG/ACT inhaler, Inhale 1-2 puffs into the lungs every 6 (six) hours as needed for wheezing or shortness of breath., Disp: , Rfl:  .  aspirin 81 MG EC tablet, TAKE 1 TABLET BY MOUTH EVERY DAY, Disp: 30 tablet, Rfl: 1 .  atorvastatin (LIPITOR) 40 MG tablet, TAKE 1 TABLET BY MOUTH EVERY DAY IN THE EVENING, Disp: 30 tablet, Rfl: 1 .  BIDIL 20-37.5 MG tablet, TAKE 2 TABLETS BY MOUTH 3 (THREE) TIMES DAILY., Disp: 180 tablet, Rfl: 1 .  carvedilol (COREG) 3.125 MG tablet, Take 2 tablets (6.25 mg total) by mouth 2 (two) times daily with a meal., Disp: 120 tablet, Rfl: 1 .  carvedilol (COREG) 6.25 MG tablet, Take 1 tablet (6.25 mg total) by mouth 2 (two) times daily with a meal., Disp: 60 tablet, Rfl: 6 .  furosemide (LASIX) 40 MG tablet, Take 1 tablet (40 mg total) by mouth 2 (two) times daily., Disp: 60 tablet, Rfl: 6 .  haloperidol decanoate (HALDOL DECANOATE) 100 MG/ML injection, Inject 100 mg into the muscle every 30 (thirty) days. , Disp: , Rfl: 99 .  KLOR-CON M20 20 MEQ tablet, TAKE 1 TABLET BY MOUTH EVERY DAY, Disp: 30 tablet, Rfl: 1 .  losartan (COZAAR) 25 MG tablet, TAKE 1 TABLET BY MOUTH EVERY DAY, Disp: 30 tablet, Rfl: 1 .  spironolactone (ALDACTONE) 25 MG tablet, TAKE 1 TABLET BY MOUTH EVERY DAY, Disp: 30 tablet, Rfl: 11 .  Tiotropium Bromide Monohydrate (SPIRIVA RESPIMAT) 2.5 MCG/ACT AERS, Inhale 1 spray daily into the lungs., Disp: 1 Inhaler, Rfl: 0    Review of Systems     Objective:   Physical Exam  Constitutional: He is oriented to person, place, and time. He appears  well-developed and well-nourished. No distress.  Disheveled.  HENT:  Head: Normocephalic and atraumatic.  Right Ear: External ear normal.  Left Ear: External ear normal.  Mouth/Throat: Oropharynx is clear and moist. No oropharyngeal exudate.  Eyes: Conjunctivae and EOM are normal. Pupils are equal, round, and reactive to light. Right eye exhibits no discharge. Left eye exhibits no discharge. No scleral icterus.  Neck: Normal range of motion. Neck supple. No JVD present. No tracheal deviation present. No thyromegaly present.  Cardiovascular: Normal rate, regular rhythm and intact distal pulses. Exam reveals no gallop and no friction rub.  No murmur heard. Pulmonary/Chest: Effort normal and breath  sounds normal. No respiratory distress. He has no wheezes. He has no rales. He exhibits no tenderness.  Abdominal: Soft. Bowel sounds are normal. He exhibits no distension and no mass. There is no tenderness. There is no rebound and no guarding.  Musculoskeletal: Normal range of motion. He exhibits no edema or tenderness.  Lymphadenopathy:    He has no cervical adenopathy.  Neurological: He is alert and oriented to person, place, and time. He has normal reflexes. No cranial nerve deficit. Coordination normal.  Skin: Skin is warm and dry. No rash noted. He is not diaphoretic. No erythema. No pallor.  Psychiatric: He has a normal mood and affect. His behavior is normal. Judgment and thought content normal.  Decent historian  Nursing note and vitals reviewed.  Vitals:   06/22/17 1110  BP: 126/66  Pulse: 68  SpO2: 94%  Weight: 158 lb (71.7 kg)  Height: 5\' 10"  (1.778 m)    Estimated body mass index is 22.67 kg/m as calculated from the following:   Height as of this encounter: 5\' 10"  (1.778 m).   Weight as of this encounter: 158 lb (71.7 kg).     Assessment:       ICD-10-CM   1. Stage 3 severe COPD by GOLD classification ([place]) J44.9   2. History of smoking 30 or more pack years Z87.891    3. Cancer screening Z12.9        Plan:       COPD: diaagnosis is severe copd.   - continue spiriva and glad is helping you  - start symbicort 80/4.5 , 2 puff twice daily  - prevnar 06/22/2017   - alpha 1 AT check 06/22/2017  - in future will discuss pulmonary rehab and possibly coming off non-specific beta blocker  Smoking: try to quit on own for now  Cancer screen: refer Ms Thurman Coyer - lung cancer screening coordinator   Followup 3 months or sooner if needed   Dr. Brand Males, M.D., [facility].C.P Pulmonary and Critical Care Medicine Staff Physician, [place] Director - Interstitial Lung Disease  Program  Pulmonary [place] [facility] [place], [place], [address]  Pager: [phone], If no answer or between  15:00h - 7:00h: call [phone] Telephone: [phone]

## 2017-06-23 ENCOUNTER — Other Ambulatory Visit: Payer: Self-pay | Admitting: Acute Care

## 2017-06-23 DIAGNOSIS — Z122 Encounter for screening for malignant neoplasm of respiratory organs: Secondary | ICD-10-CM

## 2017-06-23 DIAGNOSIS — F17210 Nicotine dependence, cigarettes, uncomplicated: Secondary | ICD-10-CM

## 2017-06-27 LAB — ALPHA-1 ANTITRYPSIN PHENOTYPE: A1 ANTITRYPSIN SER: 142 mg/dL (ref 83–199)

## 2017-06-30 ENCOUNTER — Encounter (HOSPITAL_COMMUNITY): Payer: Self-pay

## 2017-06-30 ENCOUNTER — Other Ambulatory Visit (HOSPITAL_COMMUNITY): Payer: Self-pay

## 2017-06-30 NOTE — Progress Notes (Signed)
Paramedicine Encounter    Patient ID: Gerald Nelson, male    DOB: [birth year], 66 y.o.   MRN: [id number]    Patient Care Team: [name], MD as PCP - General (Internal Medicine)  Patient Active Problem List   Diagnosis Date Noted  . Noncompliance 04/23/2017  . Mitral regurgitation 04/23/2017  . Thrombus - possible apical thrombus 01/2017 04/23/2017  . Hyperlipidemia 04/23/2017  . Pulmonary hypertension ([place])   . SOB (shortness of breath)   . Palliative care by specialist   . Tobacco abuse 04/19/2017  . Acute respiratory failure with hypoxia ([place]) 01/27/2017  . Hypertensive urgency 01/27/2017  . CHF (congestive heart failure) ([place]) 01/26/2017  . CKD (chronic kidney disease), stage III ([place]) 01/04/2017  . Essential hypertension 01/04/2017  . CVA (cerebral infarction) 12/12/2013  . Hyponatremia 12/06/2012  . Hiatal hernia 12/06/2012  . Syncope 12/06/2012  . Schizophrenia ([place])     Current Outpatient Medications:  .  aspirin 81 MG EC tablet, TAKE 1 TABLET BY MOUTH EVERY DAY, Disp: 30 tablet, Rfl: 1 .  atorvastatin (LIPITOR) 40 MG tablet, TAKE 1 TABLET BY MOUTH EVERY DAY IN THE EVENING, Disp: 30 tablet, Rfl: 1 .  BIDIL 20-37.5 MG tablet, TAKE 2 TABLETS BY MOUTH 3 (THREE) TIMES DAILY., Disp: 180 tablet, Rfl: 1 .  carvedilol (COREG) 6.25 MG tablet, Take 1 tablet (6.25 mg total) by mouth 2 (two) times daily with a meal., Disp: 60 tablet, Rfl: 6 .  furosemide (LASIX) 40 MG tablet, Take 1 tablet (40 mg total) by mouth 2 (two) times daily., Disp: 60 tablet, Rfl: 6 .  spironolactone (ALDACTONE) 25 MG tablet, TAKE 1 TABLET BY MOUTH EVERY DAY, Disp: 30 tablet, Rfl: 11 .  albuterol (PROVENTIL HFA;VENTOLIN HFA) 108 (90 Base) MCG/ACT inhaler, Inhale 1-2 puffs into the lungs every 6 (six) hours as needed for wheezing or shortness of breath., Disp: , Rfl:  .  budesonide-formoterol (SYMBICORT) 160-4.5 MCG/ACT inhaler, Inhale 2 puffs into the lungs 2 (two) times daily., Disp: 1 Inhaler, Rfl: 6 .   carvedilol (COREG) 3.125 MG tablet, Take 2 tablets (6.25 mg total) by mouth 2 (two) times daily with a meal., Disp: 120 tablet, Rfl: 1 .  haloperidol decanoate (HALDOL DECANOATE) 100 MG/ML injection, Inject 100 mg into the muscle every 30 (thirty) days. , Disp: , Rfl: 99 .  KLOR-CON M20 20 MEQ tablet, TAKE 1 TABLET BY MOUTH EVERY DAY, Disp: 30 tablet, Rfl: 1 .  losartan (COZAAR) 25 MG tablet, TAKE 1 TABLET BY MOUTH EVERY DAY, Disp: 30 tablet, Rfl: 1 .  Tiotropium Bromide Monohydrate (SPIRIVA RESPIMAT) 2.5 MCG/ACT AERS, Inhale 1 spray daily into the lungs., Disp: 1 Inhaler, Rfl: 0 Allergies  Allergen Reactions  . Sulfa Antibiotics Nausea Only     Social History   Socioeconomic History  . Marital status: Single    Spouse name: Not on file  . Number of children: Not on file  . Years of education: Not on file  . Highest education level: Not on file  Social Needs  . Financial resource strain: Not on file  . Food insecurity - worry: Not on file  . Food insecurity - inability: Not on file  . Transportation needs - medical: Not on file  . Transportation needs - non-medical: Not on file  Occupational History  . Not on file  Tobacco Use  . Smoking status: Current Every Day Smoker    Packs/day: 1.00    Years: 40.00    Pack years: 40.00  Types: Cigarettes    Start date: 25  . Smokeless tobacco: Never Used  Substance and Sexual Activity  . Alcohol use: No  . Drug use: No  . Sexual activity: Not on file  Other Topics Concern  . Not on file  Social History Narrative  . Not on file    Physical Exam  Pulmonary/Chest: No respiratory distress. He has wheezes. He has no rales.  Lower lung fields, faint wheez  Abdominal: He exhibits no distension. There is no tenderness.  Genitourinary: No penile tenderness.  Musculoskeletal: He exhibits no edema.  Skin: Skin is warm and dry. He is not diaphoretic.        Future Appointments  Date Time Provider [place]  07/11/2017   2:30 PM Magdalen Spatz, NP [facility] None  07/11/2017  3:30 PM [facility] 1 [facility]  07/13/2017 11:00 AM [facility] PA/NP [facility] None    ATF pt CAO x4 standing in the livingroom watching tv.  Pt has no complaints. He has been out of his medications since last night, therefore he didn't take any medications last night or this morning.  Pts BP is elevated, he denies blurred vision, headache, nausea and weakness.  The pharmacy did not have several medications ready this am when I stopped by.  I will need to re-visit pt within the next hour after I pick up the remaining meds which includes haldol that he need to take to his psychiatrist tomorrow. Pt also denies sob, dizziness and chest pain.  rx bottles verified and pill box refilled.    Pt does have wheezing in the lower lung fields.  He hasn't used his inhaler yet which he is supposed to use every morning. Pt stated that he will take a huff after I leave. Pt's lungs will be reassessed when I return.   BP (!) 156/106 (BP Location: Left Arm, Patient Position: Standing, Cuff Size: Normal)   Pulse 63   Resp 16   Wt 150 lb 12.8 oz (68.4 kg)   SpO2 98%   BMI 21.64 kg/m   Weight yesterday-didn't weigh Last visit weight-158    Isrrael Fluckiger, EMT Paramedic 06/30/2017    ACTION: Home visit completed Next visit planned for next thurs

## 2017-07-08 ENCOUNTER — Other Ambulatory Visit (HOSPITAL_COMMUNITY): Payer: Self-pay

## 2017-07-08 NOTE — Progress Notes (Signed)
Paramedicine Encounter    Patient ID: Gerald Nelson, male    DOB: February 17, 1951, 67 y.o.   MRN: [id number]    Patient Care Team: [name], MD as PCP - General (Internal Medicine)  Patient Active Problem List   Diagnosis Date Noted  . Noncompliance 04/23/2017  . Mitral regurgitation 04/23/2017  . Thrombus - possible apical thrombus 01/2017 04/23/2017  . Hyperlipidemia 04/23/2017  . Pulmonary hypertension ([place])   . SOB (shortness of breath)   . Palliative care by specialist   . Tobacco abuse 04/19/2017  . Acute respiratory failure with hypoxia ([place]) 01/27/2017  . Hypertensive urgency 01/27/2017  . CHF (congestive heart failure) ([place]) 01/26/2017  . CKD (chronic kidney disease), stage III ([place]) 01/04/2017  . Essential hypertension 01/04/2017  . CVA (cerebral infarction) 12/12/2013  . Hyponatremia 12/06/2012  . Hiatal hernia 12/06/2012  . Syncope 12/06/2012  . Schizophrenia ([place])     Current Outpatient Medications:  .  aspirin 81 MG EC tablet, TAKE 1 TABLET BY MOUTH EVERY DAY, Disp: 30 tablet, Rfl: 1 .  atorvastatin (LIPITOR) 40 MG tablet, TAKE 1 TABLET BY MOUTH EVERY DAY IN THE EVENING, Disp: 30 tablet, Rfl: 1 .  BIDIL 20-37.5 MG tablet, TAKE 2 TABLETS BY MOUTH 3 (THREE) TIMES DAILY., Disp: 180 tablet, Rfl: 1 .  carvedilol (COREG) 3.125 MG tablet, Take 2 tablets (6.25 mg total) by mouth 2 (two) times daily with a meal., Disp: 120 tablet, Rfl: 1 .  carvedilol (COREG) 6.25 MG tablet, Take 1 tablet (6.25 mg total) by mouth 2 (two) times daily with a meal., Disp: 60 tablet, Rfl: 6 .  furosemide (LASIX) 40 MG tablet, Take 1 tablet (40 mg total) by mouth 2 (two) times daily., Disp: 60 tablet, Rfl: 6 .  KLOR-CON M20 20 MEQ tablet, TAKE 1 TABLET BY MOUTH EVERY DAY, Disp: 30 tablet, Rfl: 1 .  losartan (COZAAR) 25 MG tablet, TAKE 1 TABLET BY MOUTH EVERY DAY, Disp: 30 tablet, Rfl: 1 .  spironolactone (ALDACTONE) 25 MG tablet, TAKE 1 TABLET BY MOUTH EVERY DAY, Disp: 30 tablet, Rfl: 11 .   albuterol (PROVENTIL HFA;VENTOLIN HFA) 108 (90 Base) MCG/ACT inhaler, Inhale 1-2 puffs into the lungs every 6 (six) hours as needed for wheezing or shortness of breath., Disp: , Rfl:  .  budesonide-formoterol (SYMBICORT) 160-4.5 MCG/ACT inhaler, Inhale 2 puffs into the lungs 2 (two) times daily., Disp: 1 Inhaler, Rfl: 6 .  haloperidol decanoate (HALDOL DECANOATE) 100 MG/ML injection, Inject 100 mg into the muscle every 30 (thirty) days. , Disp: , Rfl: 99 .  Tiotropium Bromide Monohydrate (SPIRIVA RESPIMAT) 2.5 MCG/ACT AERS, Inhale 1 spray daily into the lungs., Disp: 1 Inhaler, Rfl: 0 Allergies  Allergen Reactions  . Sulfa Antibiotics Nausea Only     Social History   Socioeconomic History  . Marital status: Single    Spouse name: Not on file  . Number of children: Not on file  . Years of education: Not on file  . Highest education level: Not on file  Social Needs  . Financial resource strain: Not on file  . Food insecurity - worry: Not on file  . Food insecurity - inability: Not on file  . Transportation needs - medical: Not on file  . Transportation needs - non-medical: Not on file  Occupational History  . Not on file  Tobacco Use  . Smoking status: Current Every Day Smoker    Packs/day: 1.00    Years: 40.00    Pack years: 40.00  Types: Cigarettes    Start date: 28  . Smokeless tobacco: Never Used  Substance and Sexual Activity  . Alcohol use: No  . Drug use: No  . Sexual activity: Not on file  Other Topics Concern  . Not on file  Social History Narrative  . Not on file    Physical Exam  Pulmonary/Chest: No respiratory distress. He has wheezes.  expiatory wheezing both lower lung fields  Abdominal: He exhibits no distension. There is no tenderness. There is no guarding.  Musculoskeletal: He exhibits no edema.  Skin: Skin is warm and dry. He is not diaphoretic.        Future Appointments  Date Time Provider [place]  07/11/2017  2:30 PM Magdalen Spatz, NP [facility] None  07/11/2017  3:30 PM [facility] 1 [facility]  07/13/2017 11:00 AM [facility] PA/NP [facility] None    ATF pt CAO x4 standing in the livingroom looking outside of the window with no complaints. Pt stated that he missed his appointment with his psychiatrist due to the weather and transportation.  He stated that he rescheduled his appointment for the 18th of this month. Pt has no complaints or feelings of behavior issues.  He get haldol shots monthly at the psychiatrist office.  Pt denies sob, chest pain and dizziness. He's still able to walk without becoming sob.  Pt has taken his medications this am besides the albuterol.  He does have some wheezing in the lower fields and he was advised of the same.    He has an appointment with the pulmonary doctor Monday and he has a CT scan scheduled also.  He stated that due to the times of the appointments that he may need to ask his friend for a ride instead of riding the bus.  Pt does intend on making both appointments.  rx bottles verified and pill box refilled.    BP 98/76 (BP Location: Left Arm, Patient Position: Standing, Cuff Size: Normal)   Pulse 90   Resp 16   Wt 150 lb 9.6 oz (68.3 kg)   SpO2 98%   BMI 21.61 kg/m   Weight yesterday-didn't weigh Last visit weight-150    [name], EMT Paramedic 07/08/2017    ACTION: Home visit completed Next visit planned for next week

## 2017-07-11 ENCOUNTER — Ambulatory Visit (INDEPENDENT_AMBULATORY_CARE_PROVIDER_SITE_OTHER)
Admission: RE | Admit: 2017-07-11 | Discharge: 2017-07-11 | Disposition: A | Discharge status: Home / Self Care | Payer: Medicare Other | Source: Ambulatory Visit | Attending: Acute Care | Admitting: Acute Care

## 2017-07-11 ENCOUNTER — Encounter: Payer: Self-pay | Admitting: Acute Care

## 2017-07-11 ENCOUNTER — Ambulatory Visit (INDEPENDENT_AMBULATORY_CARE_PROVIDER_SITE_OTHER): Payer: Medicare Other | Admitting: Acute Care

## 2017-07-11 DIAGNOSIS — Z87891 Personal history of nicotine dependence: Secondary | ICD-10-CM

## 2017-07-11 DIAGNOSIS — F17210 Nicotine dependence, cigarettes, uncomplicated: Secondary | ICD-10-CM

## 2017-07-11 DIAGNOSIS — Z122 Encounter for screening for malignant neoplasm of respiratory organs: Secondary | ICD-10-CM

## 2017-07-11 NOTE — Progress Notes (Signed)
Shared Decision Making Visit Lung Cancer Screening Program [phone])   Eligibility:  Age 67 y.o.  Pack Years Smoking History Calculation 66 pack year smoking history (# packs/per year x # years smoked)  Recent History of coughing up blood  no  Unexplained weight loss? no ( >Than 15 pounds within the last 6 months )  Prior History Lung / other cancer no (Diagnosis within the last 5 years already requiring surveillance chest CT Scans).  Smoking Status Current Smoker  Former Smokers: Years since quit: NA  Quit Date: NA  Visit Components:  Discussion included one or more decision making aids. yes  Discussion included risk/benefits of screening. yes  Discussion included potential follow up diagnostic testing for abnormal scans. yes  Discussion included meaning and risk of over diagnosis. yes  Discussion included meaning and risk of False Positives. yes  Discussion included meaning of total radiation exposure. yes  Counseling Included:  Importance of adherence to annual lung cancer LDCT screening. yes  Impact of comorbidities on ability to participate in the program. yes  Ability and willingness to under diagnostic treatment. yes  Smoking Cessation Counseling:  Current Smokers:   Discussed importance of smoking cessation. yes  Information about tobacco cessation classes and interventions provided to patient. yes  Patient provided with "ticket" for LDCT Scan. yes  Symptomatic Patient. no  Counseling  Diagnosis Code: Tobacco Use Z72.0  Asymptomatic Patient yes  Counseling (Intermediate counseling: > three minutes counseling) H8527  Former Smokers:   Discussed the importance of maintaining cigarette abstinence. yes  Diagnosis Code: Personal History of Nicotine Dependence. P82.423  Information about tobacco cessation classes and interventions provided to patient. Yes  Patient provided with "ticket" for LDCT Scan. yes  Written Order for Lung Cancer  Screening with LDCT placed in Epic. Yes (CT Chest Lung Cancer Screening Low Dose W/O CM) [id number] Z12.2-Screening of respiratory organs Z87.891-Personal history of nicotine dependence  I have spent 25 minutes of face to face time with Mr. Graser discussing the risks and benefits of lung cancer screening. We viewed a power point together that explained in detail the above noted topics. We paused at intervals to allow for questions to be asked and answered to ensure understanding.We discussed that the single most powerful action that he  can take to decrease his risk of developing lung cancer is to quit smoking. We discussed whether or not he is ready to commit to setting a quit date. He is not ready to set a quit date.We discussed options for tools to aid in quitting smoking including nicotine replacement therapy, non-nicotine medications, support groups, Quit Smart classes, and behavior modification. We discussed that often times setting smaller, more achievable goals, such as eliminating 1 cigarette a day for a week and then 2 cigarettes a day for a week can be helpful in slowly decreasing the number of cigarettes smoked. This allows for a sense of accomplishment as well as providing a clinical benefit. I gave him  the " Be Stronger Than Your Excuses" card with contact information for community resources, classes, free nicotine replacement therapy, and access to mobile apps, text messaging, and on-line smoking cessation help. I have also given him my card and contact information in the event he needs to contact me. We discussed the time and location of the scan, and that either Doroteo Glassman RN or I will call with the results within 24-48 hours of receiving them. I have offered him   a copy of the power point we viewed  as a resource in the event they need reinforcement of the concepts we discussed today in the office. The patient verbalized understanding of all of  the above and had no further questions upon  leaving the office. They have my contact information in the event they have any further questions.  I spent 4-5  minutes counseling on smoking cessation and the health risks of continued tobacco abuse.  I explained to the patient that there has been a high incidence of coronary artery disease noted on these exams. I explained that this is a non-gated exam therefore degree or severity cannot be determined. This patient is currently on statin therapy. I have asked the patient to follow-up with their PCP regarding any incidental finding of coronary artery disease and management with diet or medication as their PCP  feels is clinically indicated. The patient verbalized understanding of the above and had no further questions upon completion of the visit.    Magdalen Spatz, AGACNP-BC [place] Pager # [phone] 07/11/2017

## 2017-07-13 ENCOUNTER — Other Ambulatory Visit (HOSPITAL_COMMUNITY): Payer: Self-pay

## 2017-07-13 ENCOUNTER — Telehealth (HOSPITAL_COMMUNITY): Payer: Self-pay

## 2017-07-13 ENCOUNTER — Ambulatory Visit (HOSPITAL_COMMUNITY)
Admission: RE | Admit: 2017-07-13 | Discharge: 2017-07-13 | Disposition: A | Discharge status: Home / Self Care | Payer: Medicare Other | Source: Ambulatory Visit | Attending: Internal Medicine | Admitting: Internal Medicine

## 2017-07-13 ENCOUNTER — Encounter (HOSPITAL_COMMUNITY): Payer: Self-pay

## 2017-07-13 VITALS — BP 122/78 | HR 84 | Wt 156.2 lb

## 2017-07-13 DIAGNOSIS — F209 Schizophrenia, unspecified: Secondary | ICD-10-CM | POA: Insufficient documentation

## 2017-07-13 DIAGNOSIS — Z7982 Long term (current) use of aspirin: Secondary | ICD-10-CM | POA: Diagnosis not present

## 2017-07-13 DIAGNOSIS — Z882 Allergy status to sulfonamides status: Secondary | ICD-10-CM | POA: Insufficient documentation

## 2017-07-13 DIAGNOSIS — Z720 Tobacco use: Secondary | ICD-10-CM

## 2017-07-13 DIAGNOSIS — I130 Hypertensive heart and chronic kidney disease with heart failure and stage 1 through stage 4 chronic kidney disease, or unspecified chronic kidney disease: Secondary | ICD-10-CM | POA: Diagnosis not present

## 2017-07-13 DIAGNOSIS — I272 Pulmonary hypertension, unspecified: Secondary | ICD-10-CM | POA: Diagnosis not present

## 2017-07-13 DIAGNOSIS — Z8673 Personal history of transient ischemic attack (TIA), and cerebral infarction without residual deficits: Secondary | ICD-10-CM | POA: Insufficient documentation

## 2017-07-13 DIAGNOSIS — Z8249 Family history of ischemic heart disease and other diseases of the circulatory system: Secondary | ICD-10-CM | POA: Diagnosis not present

## 2017-07-13 DIAGNOSIS — J449 Chronic obstructive pulmonary disease, unspecified: Secondary | ICD-10-CM | POA: Diagnosis not present

## 2017-07-13 DIAGNOSIS — E785 Hyperlipidemia, unspecified: Secondary | ICD-10-CM | POA: Diagnosis not present

## 2017-07-13 DIAGNOSIS — Z79899 Other long term (current) drug therapy: Secondary | ICD-10-CM | POA: Diagnosis not present

## 2017-07-13 DIAGNOSIS — F17210 Nicotine dependence, cigarettes, uncomplicated: Secondary | ICD-10-CM | POA: Diagnosis not present

## 2017-07-13 DIAGNOSIS — I10 Essential (primary) hypertension: Secondary | ICD-10-CM

## 2017-07-13 DIAGNOSIS — M1990 Unspecified osteoarthritis, unspecified site: Secondary | ICD-10-CM | POA: Diagnosis not present

## 2017-07-13 DIAGNOSIS — Z7951 Long term (current) use of inhaled steroids: Secondary | ICD-10-CM | POA: Insufficient documentation

## 2017-07-13 DIAGNOSIS — N183 Chronic kidney disease, stage 3 unspecified: Secondary | ICD-10-CM

## 2017-07-13 DIAGNOSIS — I5022 Chronic systolic (congestive) heart failure: Secondary | ICD-10-CM | POA: Diagnosis not present

## 2017-07-13 DIAGNOSIS — I340 Nonrheumatic mitral (valve) insufficiency: Secondary | ICD-10-CM | POA: Diagnosis not present

## 2017-07-13 LAB — BASIC METABOLIC PANEL
Anion gap: 7 (ref 5–15)
BUN: 38 mg/dL — ABNORMAL HIGH (ref 6–20)
CALCIUM: 9.7 mg/dL (ref 8.9–10.3)
CO2: 29 mmol/L (ref 22–32)
CREATININE: 2.16 mg/dL — AB (ref 0.61–1.24)
Chloride: 100 mmol/L — ABNORMAL LOW (ref 101–111)
GFR calc Af Amer: 35 mL/min — ABNORMAL LOW (ref >60)
GFR calc non Af Amer: 31 mL/min — ABNORMAL LOW (ref >60)
GLUCOSE: 98 mg/dL (ref 65–99)
Potassium: 3.8 mmol/L (ref 3.5–5.1)
Sodium: 136 mmol/L (ref 135–145)

## 2017-07-13 MED ORDER — LOSARTAN POTASSIUM 25 MG PO TABS: 25.0000 mg | ORAL_TABLET | Freq: Every day | ORAL | 3 refills | Status: DC

## 2017-07-13 MED ORDER — SACUBITRIL-VALSARTAN 24-26 MG PO TABS: 1.0000 | ORAL_TABLET | Freq: Two times a day (BID) | ORAL | 6 refills | Status: DC

## 2017-07-13 MED ORDER — NICOTINE 14 MG/24HR TD PT24: 14.0000 mg | MEDICATED_PATCH | Freq: Every day | TRANSDERMAL | 0 refills | Status: DC

## 2017-07-13 NOTE — Progress Notes (Signed)
Advanced Heart Failure Clinic Note    Primary Care: Dr. Deforest Hoyles   Primary Cardiologist: Dr. Harrington Challenger, Dr. Aundra Dubin   HPI: Gerald Nelson is a 67 year old male with a past medical history of chronic systolic CHF (EF 75% in July 2018), severe MR/TR, pulmonary HTN, CVA, CKD, schizophrenia, HTN and tobacco abuse.   Gerald Nelson was admitted to [facility] 7/2-01/06/17 for newly diagnosed acute systolic CHF. Echo showed LVEF 15%, severe global hypokinesis, moderate LVH, coarsetrabeculation of the LV apex with numerous false tendinae of the left ventricle, very stagnant blood flow at the LV apex withsmoke but no obvious LV thrombus - Definity contrast was given,again, noting stagnant apical blood flow - this could suggestrecent thrombus and certainly high risk for apical thrombusformation. Other findings include aortic sclerosis with mild AI,moderate to severe MR, moderate LAE, mild RAE, moderate to severeTR, moderate to severe pulmonary hypertension (RVSP 73 mmHg), dilated IVC, trivial posterior pericardial effusion - tamponade physiology could be excluded given the degree of pulmonaryhypertension.He was seen by Dr. Harrington Challenger during this admission who felt that he needed a L/RHC, but patient left AMA. When he left, he did not pick up any medications due to lack of money and only continued to take ASA. He lives in a boarding house with two room mates and lives off of social security. He does have medicare. The only medication he takes regularly is haldol given to him by his psychiatrist.He has left the hospital several times before and has limited therapeutic options because of social and psych issues and compliance. He left on 10/3 and has stayed at a boarding house since then.  He left AMA 04/07/17. He was seen back in the office by Dr. Percival Spanish on 04/18/2017. Reported continued DOE, cough, and leg edema. He reported he had been taking meds but this could not be verified. It was not clear that he was compliant with sodium  restriction (eating pasta and ground beef). Prior discharge weights were variable, reportedly 159 in our office the day of his visit with Dr. Percival Spanish (with measured weight of 156 on 10/16). Due to concern for significant volume overload he was admitted for further evaluation.   He diuresed 25 pounds on IV lasix. He refused cath. His clonidine and diltazem were stopped during this admission. Referred to paramedicine. Discharge weight 133 pounds.   He presents today for follow up. Paramedicine continues to follow at home. He is smoking 1-1.5 packs per day.  Taking all medication as directed per paramedicine.  He is still uninterested in ischemic work up.  He would consider ICD.  Denies orthopnea or PND. He denies lightheadedness or dizziness. Denies SOB on flat ground or with ADLs.   Review of systems complete and found to be negative unless listed in HPI.    Past Medical History:  Diagnosis Date  . Apical mural thrombus    a. question of apical thrombus on echo 01/2017, pt left AMA, not felt to be anticoag candidate with noncompliance.  . Arthritis   . CHF (congestive heart failure) ([place])   . Chronic systolic CHF (congestive heart failure) ([place])    a. pt refused cath. EF 15% 01/2017.  . CKD (chronic kidney disease), stage III ([place])   . COPD (chronic obstructive pulmonary disease) ([place])   . Hernia, hiatal   . Hyperlipidemia   . Hypertension   . Hyponatremia   . Mitral regurgitation    a. mod-severe by echo 01/2017.  . Pulmonary hypertension ([place])   . Schizophrenia ([place])   .  Tobacco abuse     Current Outpatient Medications  Medication Sig Dispense Refill  . albuterol (PROVENTIL HFA;VENTOLIN HFA) 108 (90 Base) MCG/ACT inhaler Inhale 1-2 puffs into the lungs every 6 (six) hours as needed for wheezing or shortness of breath.    Marland Kitchen aspirin 81 MG EC tablet TAKE 1 TABLET BY MOUTH EVERY DAY 30 tablet 1  . atorvastatin (LIPITOR) 40 MG tablet TAKE 1 TABLET BY MOUTH EVERY DAY IN THE EVENING 30  tablet 1  . BIDIL 20-37.5 MG tablet TAKE 2 TABLETS BY MOUTH 3 (THREE) TIMES DAILY. 180 tablet 1  . budesonide-formoterol (SYMBICORT) 160-4.5 MCG/ACT inhaler Inhale 2 puffs into the lungs 2 (two) times daily. 1 Inhaler 6  . carvedilol (COREG) 6.25 MG tablet Take 1 tablet (6.25 mg total) by mouth 2 (two) times daily with a meal. 60 tablet 6  . furosemide (LASIX) 40 MG tablet Take 1 tablet (40 mg total) by mouth 2 (two) times daily. 60 tablet 6  . haloperidol decanoate (HALDOL DECANOATE) 100 MG/ML injection Inject 100 mg into the muscle every 30 (thirty) days.   99  . KLOR-CON M20 20 MEQ tablet TAKE 1 TABLET BY MOUTH EVERY DAY 30 tablet 1  . losartan (COZAAR) 25 MG tablet TAKE 1 TABLET BY MOUTH EVERY DAY 30 tablet 1  . spironolactone (ALDACTONE) 25 MG tablet TAKE 1 TABLET BY MOUTH EVERY DAY 30 tablet 11  . Tiotropium Bromide Monohydrate (SPIRIVA RESPIMAT) 2.5 MCG/ACT AERS Inhale 1 spray daily into the lungs. 1 Inhaler 0   No current facility-administered medications for this encounter.     Allergies  Allergen Reactions  . Sulfa Antibiotics Nausea Only      Social History   Socioeconomic History  . Marital status: Single    Spouse name: Not on file  . Number of children: Not on file  . Years of education: Not on file  . Highest education level: Not on file  Social Needs  . Financial resource strain: Not on file  . Food insecurity - worry: Not on file  . Food insecurity - inability: Not on file  . Transportation needs - medical: Not on file  . Transportation needs - non-medical: Not on file  Occupational History  . Not on file  Tobacco Use  . Smoking status: Current Every Day Smoker    Packs/day: 1.50    Years: 44.00    Pack years: 66.00    Types: Cigarettes    Start date: 32  . Smokeless tobacco: Never Used  Substance and Sexual Activity  . Alcohol use: No  . Drug use: No  . Sexual activity: Not on file  Other Topics Concern  . Not on file  Social History Narrative  .  Not on file      Family History  Problem Relation Age of Onset  . Hypertension Mother   . Hypertension Father    Vitals:   07/13/17 1058  BP: 122/78  Pulse: 84  SpO2: 94%  Weight: 156 lb 3.2 oz (70.9 kg)   Wt Readings from Last 3 Encounters:  07/13/17 156 lb 3.2 oz (70.9 kg)  07/08/17 150 lb 9.6 oz (68.3 kg)  06/30/17 150 lb 12.8 oz (68.4 kg)   PHYSICAL EXAM: General: Elderly appearing. Strong smell of cigarette smoke.  HEENT: Normal Neck: Supple. JVP 6-7 cm. Carotids 2+ bilat; no bruits. No thyromegaly or nodule noted. Cor: PMI nondisplaced. RRR, 2/6 SEM Lungs: CTAB, normal effort. Abdomen: Soft, non-tender, non-distended, no HSM. No bruits or  masses. +BS  Extremities: No cyanosis, clubbing, or rash. Trace ankle edema at most. Neuro: Alert & orientedx3, cranial nerves grossly intact. moves all 4 extremities w/o difficulty. Affect pleasant   ASSESSMENT & PLAN: 1. Chronic systolic CHF: Echo 03/4800 EF 15%.  - Continues to refuse ischemic work up.  - NYHA II-III - Volume elevated stable on exam.  - Continue lasix 40 mg BID.  - Continue coreg 6.25 mg BID.  - Stop losartan. Will cautiously switch to Entresto 24/26 mg BID so long as Creatinine stable to improved. BMET today and 7-10 days. - Continue spiro 25 mg daily.  - Repeat Echo. He is willing to be considered for ICD. He knows that they may require him to get ischemic work up, to determine prognosis if considered for ICD.  - Reinforced fluid restriction to < 2 L daily, sodium restriction to less than 2000 mg daily, and the importance of daily weights.  .  2. Questionable apical thrombus:  - Poor anticoagulation candidate. No change.   3. MR - Mod/Sev on last echo. Suspect functional. No change. Refuses cath as above.   4. Tobacco abuse - continues to smoke > 1 ppd.  - Will provide with rx for nicotine patches.   5. CKD III:  - BMET today.   6. SCHIZOPHRENIA:  - Follows with PCP.   7. HTN -  Stable on meds as above.   8. COPD - Stable. Encouraged smoking cessation.   Meds, Labs, and Repeat Echo as above. RTC 4 weeks.   Shirley Friar, PA-C 07/13/17   Greater than 50% of the 30 minute visit was spent in counseling/coordination of care regarding disease state education, salt/fluid restriction, sliding scale diuretics, and medication compliance.

## 2017-07-13 NOTE — Progress Notes (Signed)
Medication Samples have been provided to the patient.  Drug name: Gerald Nelson       Strength: 24/34f        Qty: 4  LOT: [id number]  Exp.Date: 6/21  Dosing instructions: Take 1 tab by mouth twice a day   The patient has been instructed regarding the correct time, dose, and frequency of taking this medication, including desired effects and most common side effects.   [name] 11:37 AM 07/13/2017

## 2017-07-13 NOTE — Progress Notes (Signed)
Paramedicine Encounter   Patient ID: Gerald Nelson , male,   DOB: April 12, 1951,66 y.o.,  MRN: [id number]   Met patient in clinic today with provider Gerald Nelson/.   Pt came in today with a non productive cough.  Pt stated that he has no sob, chest pain, and no dizziness.  Pt took his medications prior to this visit.  Pt reports that he hasn't had a decrease with urine output.  Pt denies needing to take an extra lasix; no increase in pillows to sleep at night.  Pt has shown an interest in nicotin patches, Gerald Nelson advised that he will give him a list of over the counter meds for the cough and rx for nicotin. pts bottles verified and pill box refilled.   Gerald Nelson talked to pt about getting testing to see if he has any heart blockages.  He also spoke to him about defiberator; pt agrees to Gerald Nelson.     He went to the pulmonary doctor this week to get a CT scan for lung cancer. Pt is waiting for the results.    **rx change: Stop losartan Start entresto  Time spent with patient 40 mins  [place] Gerald Nelson, EMT-Paramedic 07/13/2017   ACTION: Next visit planned for next week

## 2017-07-13 NOTE — Patient Instructions (Addendum)
STOP Losartan.  START Entresto 24/26 mg tablet twice dialy on Friday morning.  Routine lab work today. Will notify you of abnormal results, otherwise no news is good news!  Will schedule you for an echocardiogram and lab work at [facility]. Address: [address] ([address] Floor), [place], [place] [address]  Phone: [phone]  Follow up with Oda Kilts PA-C in 4 weeks.  Take all medication as prescribed the day of your appointment. Bring all medications with you to your appointment.  Do the following things EVERYDAY: 1) Weigh yourself in the morning before breakfast. Write it down and keep it in a log. 2) Take your medicines as prescribed 3) Eat low salt foods-Limit salt (sodium) to 2000 mg per day.  4) Stay as active as you can everyday 5) Limit all fluids for the day to less than 2 liters

## 2017-07-13 NOTE — Progress Notes (Signed)
Jinny Blossom called from the AHF clinic and advised me that due to pts creatine levels, pt can not take entresto.  Re-visit to switch out entresto with losartan.  Pt's pill box revised and he was advised of the reason.

## 2017-07-13 NOTE — Telephone Encounter (Addendum)
LVMTCB advising to STOP Entresto and RESTART Losartan 25 mg QD as previous due to today's lab work showing serum Cr 2.1 per Estrella Myrtle PA-C VO.

## 2017-07-19 ENCOUNTER — Other Ambulatory Visit: Payer: Self-pay

## 2017-07-19 ENCOUNTER — Other Ambulatory Visit (HOSPITAL_COMMUNITY): Payer: Self-pay

## 2017-07-20 ENCOUNTER — Encounter (HOSPITAL_COMMUNITY): Payer: Self-pay

## 2017-07-20 ENCOUNTER — Other Ambulatory Visit (HOSPITAL_COMMUNITY): Payer: Self-pay

## 2017-07-20 NOTE — Progress Notes (Signed)
Paramedicine Encounter    Patient ID: Gerald Nelson, male    DOB: [birth year], 67 y.o.   MRN: [id number]    Patient Care Team: [name], MD as PCP - General (Internal Medicine)  Patient Active Problem List   Diagnosis Date Noted  . Noncompliance 04/23/2017  . Mitral regurgitation 04/23/2017  . Thrombus - possible apical thrombus 01/2017 04/23/2017  . Hyperlipidemia 04/23/2017  . Pulmonary hypertension ([place])   . SOB (shortness of breath)   . Palliative care by specialist   . Tobacco abuse 04/19/2017  . Acute respiratory failure with hypoxia ([place]) 01/27/2017  . Hypertensive urgency 01/27/2017  . CHF (congestive heart failure) ([place]) 01/26/2017  . CKD (chronic kidney disease), stage III ([place]) 01/04/2017  . Essential hypertension 01/04/2017  . CVA (cerebral infarction) 12/12/2013  . Hyponatremia 12/06/2012  . Hiatal hernia 12/06/2012  . Syncope 12/06/2012  . Schizophrenia ([place])     Current Outpatient Medications:  .  albuterol (PROVENTIL HFA;VENTOLIN HFA) 108 (90 Base) MCG/ACT inhaler, Inhale 1-2 puffs into the lungs every 6 (six) hours as needed for wheezing or shortness of breath., Disp: , Rfl:  .  aspirin 81 MG EC tablet, TAKE 1 TABLET BY MOUTH EVERY DAY, Disp: 30 tablet, Rfl: 1 .  atorvastatin (LIPITOR) 40 MG tablet, TAKE 1 TABLET BY MOUTH EVERY DAY IN THE EVENING, Disp: 30 tablet, Rfl: 1 .  BIDIL 20-37.5 MG tablet, TAKE 2 TABLETS BY MOUTH 3 (THREE) TIMES DAILY., Disp: 180 tablet, Rfl: 1 .  carvedilol (COREG) 6.25 MG tablet, Take 1 tablet (6.25 mg total) by mouth 2 (two) times daily with a meal., Disp: 60 tablet, Rfl: 6 .  furosemide (LASIX) 40 MG tablet, Take 1 tablet (40 mg total) by mouth 2 (two) times daily., Disp: 60 tablet, Rfl: 6 .  KLOR-CON M20 20 MEQ tablet, TAKE 1 TABLET BY MOUTH EVERY DAY, Disp: 30 tablet, Rfl: 1 .  losartan (COZAAR) 25 MG tablet, Take 1 tablet (25 mg total) by mouth daily., Disp: 90 tablet, Rfl: 3 .  spironolactone (ALDACTONE) 25 MG tablet, TAKE  1 TABLET BY MOUTH EVERY DAY, Disp: 30 tablet, Rfl: 11 .  budesonide-formoterol (SYMBICORT) 160-4.5 MCG/ACT inhaler, Inhale 2 puffs into the lungs 2 (two) times daily., Disp: 1 Inhaler, Rfl: 6 .  haloperidol decanoate (HALDOL DECANOATE) 100 MG/ML injection, Inject 100 mg into the muscle every 30 (thirty) days. , Disp: , Rfl: 99 .  nicotine (NICODERM CQ - DOSED IN MG/24 HOURS) 14 mg/24hr patch, Place 1 patch (14 mg total) onto the skin daily., Disp: 28 patch, Rfl: 0 .  Tiotropium Bromide Monohydrate (SPIRIVA RESPIMAT) 2.5 MCG/ACT AERS, Inhale 1 spray daily into the lungs., Disp: 1 Inhaler, Rfl: 0 Allergies  Allergen Reactions  . Sulfa Antibiotics Nausea Only     Social History   Socioeconomic History  . Marital status: Single    Spouse name: Not on file  . Number of children: Not on file  . Years of education: Not on file  . Highest education level: Not on file  Social Needs  . Financial resource strain: Not on file  . Food insecurity - worry: Not on file  . Food insecurity - inability: Not on file  . Transportation needs - medical: Not on file  . Transportation needs - non-medical: Not on file  Occupational History  . Not on file  Tobacco Use  . Smoking status: Current Every Day Smoker    Packs/day: 1.50    Years: 44.00    Pack  years: 66.00    Types: Cigarettes    Start date: 64  . Smokeless tobacco: Never Used  Substance and Sexual Activity  . Alcohol use: No  . Drug use: No  . Sexual activity: Not on file  Other Topics Concern  . Not on file  Social History Narrative  . Not on file    Physical Exam  Constitutional: He appears well-developed.  Pulmonary/Chest: No respiratory distress. He has no wheezes.  Abdominal: He exhibits no distension. There is no tenderness. There is no guarding.  Musculoskeletal: He exhibits no edema.  Skin: Skin is warm and dry. He is not diaphoretic.        Future Appointments  Date Time Provider [place]  08/10/2017 11:30  AM [facility] PA/NP [facility] None    ATF pt CAO x4 standing in the living room with no complaints.  He stated that he used his inhaler this morning due to having a asthma attack.  Pt stated that he feels a lot better and he is still need to get the his rx for nicotine.  Pt is still smoking.  Pt has taken all of his meds this week. He denies sob, dizziness and chest pain.  rx bottles verified and pill box refilled.   BP 118/82 (BP Location: Left Arm, Patient Position: Standing, Cuff Size: Normal)   Pulse 98   Resp 16   Wt 156 lb 3.2 oz (70.9 kg)   SpO2 98%   BMI 22.41 kg/m   Weight yesterday-didn't weigh yesterday Last visit weight-   **rx called in but too early to fill: Losartan Furosemide klor con  Pt isbgoing to use carvedilol 3.125 (2) until the bottle is empty to save money   Raynald Rouillard, EMT Paramedic 07/20/2017    ACTION: Home visit completed

## 2017-07-25 ENCOUNTER — Other Ambulatory Visit: Payer: Self-pay | Admitting: Acute Care

## 2017-07-25 DIAGNOSIS — F17210 Nicotine dependence, cigarettes, uncomplicated: Secondary | ICD-10-CM

## 2017-07-25 DIAGNOSIS — Z122 Encounter for screening for malignant neoplasm of respiratory organs: Secondary | ICD-10-CM

## 2017-07-27 ENCOUNTER — Other Ambulatory Visit (HOSPITAL_COMMUNITY): Payer: Self-pay

## 2017-07-27 ENCOUNTER — Encounter (HOSPITAL_COMMUNITY): Payer: Self-pay

## 2017-07-28 NOTE — Progress Notes (Signed)
Paramedicine Encounter    Patient ID: Gerald Nelson, male    DOB: [birth year], 67 y.o.   MRN: [id number]    Patient Care Team: [name], MD as PCP - General (Internal Medicine)  Patient Active Problem List   Diagnosis Date Noted  . Noncompliance 04/23/2017  . Mitral regurgitation 04/23/2017  . Thrombus - possible apical thrombus 01/2017 04/23/2017  . Hyperlipidemia 04/23/2017  . Pulmonary hypertension ([place])   . SOB (shortness of breath)   . Palliative care by specialist   . Tobacco abuse 04/19/2017  . Acute respiratory failure with hypoxia ([place]) 01/27/2017  . Hypertensive urgency 01/27/2017  . CHF (congestive heart failure) ([place]) 01/26/2017  . CKD (chronic kidney disease), stage III ([place]) 01/04/2017  . Essential hypertension 01/04/2017  . CVA (cerebral infarction) 12/12/2013  . Hyponatremia 12/06/2012  . Hiatal hernia 12/06/2012  . Syncope 12/06/2012  . Schizophrenia ([place])     Current Outpatient Medications:  .  aspirin 81 MG EC tablet, TAKE 1 TABLET BY MOUTH EVERY DAY, Disp: 30 tablet, Rfl: 1 .  atorvastatin (LIPITOR) 40 MG tablet, TAKE 1 TABLET BY MOUTH EVERY DAY IN THE EVENING, Disp: 30 tablet, Rfl: 1 .  BIDIL 20-37.5 MG tablet, TAKE 2 TABLETS BY MOUTH 3 (THREE) TIMES DAILY., Disp: 180 tablet, Rfl: 1 .  carvedilol (COREG) 6.25 MG tablet, Take 1 tablet (6.25 mg total) by mouth 2 (two) times daily with a meal., Disp: 60 tablet, Rfl: 6 .  furosemide (LASIX) 40 MG tablet, Take 1 tablet (40 mg total) by mouth 2 (two) times daily., Disp: 60 tablet, Rfl: 6 .  haloperidol decanoate (HALDOL DECANOATE) 100 MG/ML injection, Inject 100 mg into the muscle every 30 (thirty) days. , Disp: , Rfl: 99 .  KLOR-CON M20 20 MEQ tablet, TAKE 1 TABLET BY MOUTH EVERY DAY, Disp: 30 tablet, Rfl: 1 .  losartan (COZAAR) 25 MG tablet, Take 1 tablet (25 mg total) by mouth daily., Disp: 90 tablet, Rfl: 3 .  spironolactone (ALDACTONE) 25 MG tablet, TAKE 1 TABLET BY MOUTH EVERY DAY, Disp: 30 tablet, Rfl:  11 .  albuterol (PROVENTIL HFA;VENTOLIN HFA) 108 (90 Base) MCG/ACT inhaler, Inhale 1-2 puffs into the lungs every 6 (six) hours as needed for wheezing or shortness of breath., Disp: , Rfl:  .  budesonide-formoterol (SYMBICORT) 160-4.5 MCG/ACT inhaler, Inhale 2 puffs into the lungs 2 (two) times daily., Disp: 1 Inhaler, Rfl: 6 .  nicotine (NICODERM CQ - DOSED IN MG/24 HOURS) 14 mg/24hr patch, Place 1 patch (14 mg total) onto the skin daily., Disp: 28 patch, Rfl: 0 .  Tiotropium Bromide Monohydrate (SPIRIVA RESPIMAT) 2.5 MCG/ACT AERS, Inhale 1 spray daily into the lungs., Disp: 1 Inhaler, Rfl: 0 Allergies  Allergen Reactions  . Sulfa Antibiotics Nausea Only     Social History   Socioeconomic History  . Marital status: Single    Spouse name: Not on file  . Number of children: Not on file  . Years of education: Not on file  . Highest education level: Not on file  Social Needs  . Financial resource strain: Not on file  . Food insecurity - worry: Not on file  . Food insecurity - inability: Not on file  . Transportation needs - medical: Not on file  . Transportation needs - non-medical: Not on file  Occupational History  . Not on file  Tobacco Use  . Smoking status: Current Every Day Smoker    Packs/day: 1.50    Years: 44.00    Pack  years: 66.00    Types: Cigarettes    Start date: 53  . Smokeless tobacco: Never Used  Substance and Sexual Activity  . Alcohol use: No  . Drug use: No  . Sexual activity: Not on file  Other Topics Concern  . Not on file  Social History Narrative  . Not on file    Physical Exam  Pulmonary/Chest: No respiratory distress. He has no wheezes.  Abdominal: He exhibits no distension. There is no tenderness.  Genitourinary: No penile tenderness.  Musculoskeletal: He exhibits no edema.  Skin: Skin is warm and dry. He is not diaphoretic.        Future Appointments  Date Time Provider [place]  08/10/2017 11:30 AM [facility] PA/NP [facility]  None  08/11/2017  2:00 PM [facility] 4 [facility]    ATF pt CAO x4 standing in the living room with no complaints. Pt stated that he feels good today and he just took his morning medications about an 1hr ago.  He stated that he did go to his psychiatrist last Friday to get his haldol shot.  Pt behavior is normal today and he's not talking to himself as much as he normally does.  Pt has taken all of his medications for the week and didn't miss any dose.  He denies sob, dizziness and chest pain today.  He hasn't had to use additional pillows to sleep.  rx bottles verified and pill box refilled.  Pt stated that he is looking forward to his appointment with dr. Sheryle Hail.  I reminded him to talk to him about the defib that was discussed during his last visit at the [facility] clinic.   Pt stated that he cooked chicken with veggies in the crockpot over the weekend.  I encouraged pt and told him to keep up the good work.    BP 102/74 (BP Location: Left Arm, Patient Position: Standing, Cuff Size: Normal)   Pulse (!) 112   Resp 16   Wt 151 lb 9.6 oz (68.8 kg)   SpO2 97%   BMI 21.75 kg/m   Weight yesterday-didn't weigh Last visit weight- [place], EMT Paramedic 07/28/2017    ACTION: Home visit completed Next visit planned for next week

## 2017-08-03 ENCOUNTER — Other Ambulatory Visit (HOSPITAL_COMMUNITY): Payer: Self-pay

## 2017-08-03 ENCOUNTER — Encounter (HOSPITAL_COMMUNITY): Payer: Self-pay

## 2017-08-03 NOTE — Progress Notes (Signed)
Paramedicine Encounter    Patient ID: Gerald Nelson, male    DOB: [birth year], 67 y.o.   MRN: [id number]    Patient Care Team: [name], MD as PCP - General (Internal Medicine)  Patient Active Problem List   Diagnosis Date Noted  . Noncompliance 04/23/2017  . Mitral regurgitation 04/23/2017  . Thrombus - possible apical thrombus 01/2017 04/23/2017  . Hyperlipidemia 04/23/2017  . Pulmonary hypertension ([place])   . SOB (shortness of breath)   . Palliative care by specialist   . Tobacco abuse 04/19/2017  . Acute respiratory failure with hypoxia ([place]) 01/27/2017  . Hypertensive urgency 01/27/2017  . CHF (congestive heart failure) ([place]) 01/26/2017  . CKD (chronic kidney disease), stage III ([place]) 01/04/2017  . Essential hypertension 01/04/2017  . CVA (cerebral infarction) 12/12/2013  . Hyponatremia 12/06/2012  . Hiatal hernia 12/06/2012  . Syncope 12/06/2012  . Schizophrenia ([place])     Current Outpatient Medications:  .  aspirin 81 MG EC tablet, TAKE 1 TABLET BY MOUTH EVERY DAY, Disp: 30 tablet, Rfl: 1 .  atorvastatin (LIPITOR) 40 MG tablet, TAKE 1 TABLET BY MOUTH EVERY DAY IN THE EVENING, Disp: 30 tablet, Rfl: 1 .  BIDIL 20-37.5 MG tablet, TAKE 2 TABLETS BY MOUTH 3 (THREE) TIMES DAILY., Disp: 180 tablet, Rfl: 1 .  carvedilol (COREG) 6.25 MG tablet, Take 1 tablet (6.25 mg total) by mouth 2 (two) times daily with a meal., Disp: 60 tablet, Rfl: 6 .  furosemide (LASIX) 40 MG tablet, Take 1 tablet (40 mg total) by mouth 2 (two) times daily., Disp: 60 tablet, Rfl: 6 .  KLOR-CON M20 20 MEQ tablet, TAKE 1 TABLET BY MOUTH EVERY DAY, Disp: 30 tablet, Rfl: 1 .  losartan (COZAAR) 25 MG tablet, Take 1 tablet (25 mg total) by mouth daily., Disp: 90 tablet, Rfl: 3 .  spironolactone (ALDACTONE) 25 MG tablet, TAKE 1 TABLET BY MOUTH EVERY DAY, Disp: 30 tablet, Rfl: 11 .  albuterol (PROVENTIL HFA;VENTOLIN HFA) 108 (90 Base) MCG/ACT inhaler, Inhale 1-2 puffs into the lungs every 6 (six) hours as  needed for wheezing or shortness of breath., Disp: , Rfl:  .  budesonide-formoterol (SYMBICORT) 160-4.5 MCG/ACT inhaler, Inhale 2 puffs into the lungs 2 (two) times daily., Disp: 1 Inhaler, Rfl: 6 .  haloperidol decanoate (HALDOL DECANOATE) 100 MG/ML injection, Inject 100 mg into the muscle every 30 (thirty) days. , Disp: , Rfl: 99 .  nicotine (NICODERM CQ - DOSED IN MG/24 HOURS) 14 mg/24hr patch, Place 1 patch (14 mg total) onto the skin daily., Disp: 28 patch, Rfl: 0 .  Tiotropium Bromide Monohydrate (SPIRIVA RESPIMAT) 2.5 MCG/ACT AERS, Inhale 1 spray daily into the lungs., Disp: 1 Inhaler, Rfl: 0 Allergies  Allergen Reactions  . Sulfa Antibiotics Nausea Only     Social History   Socioeconomic History  . Marital status: Single    Spouse name: Not on file  . Number of children: Not on file  . Years of education: Not on file  . Highest education level: Not on file  Social Needs  . Financial resource strain: Not on file  . Food insecurity - worry: Not on file  . Food insecurity - inability: Not on file  . Transportation needs - medical: Not on file  . Transportation needs - non-medical: Not on file  Occupational History  . Not on file  Tobacco Use  . Smoking status: Current Every Day Smoker    Packs/day: 1.50    Years: 44.00    Pack  years: 66.00    Types: Cigarettes    Start date: 1  . Smokeless tobacco: Never Used  Substance and Sexual Activity  . Alcohol use: No  . Drug use: No  . Sexual activity: Not on file  Other Topics Concern  . Not on file  Social History Narrative  . Not on file    Physical Exam  Pulmonary/Chest: No respiratory distress. He has no wheezes. He has no rales.  Abdominal: He exhibits no distension. There is no tenderness. There is no guarding.  Musculoskeletal: He exhibits no edema.  Skin: Skin is warm and dry. He is not diaphoretic.        Future Appointments  Date Time Provider [place]  08/10/2017 11:30 AM [facility] PA/NP  [facility] None  08/11/2017  2:00 PM [facility] 4 [facility]    ATF pt CAO x4 standing in the living room with no complaints.  He stated that he used his inhaler earlier today like normal but hasn't used it more than once a day. Pt has taken all of his meds this week besides fri evening dose. He denies sob, chest pain and dizziness today.  He stated that he missed one of his appointments this week but he rescheduled it.  [place] rx bottles verified and pill box refilled.   BP (!) 130/100 (BP Location: Left Arm, Patient Position: Standing, Cuff Size: Normal)   Pulse 98   Resp 16   Wt 153 lb 3.2 oz (69.5 kg)   SpO2 98%   BMI 21.98 kg/m   Weight yesterday-152 Last visit weight-151    Rekha Hobbins, EMT Paramedic 08/03/2017    ACTION: Home visit completed

## 2017-08-10 ENCOUNTER — Encounter (HOSPITAL_COMMUNITY): Payer: Self-pay

## 2017-08-10 ENCOUNTER — Other Ambulatory Visit (HOSPITAL_COMMUNITY): Payer: Self-pay

## 2017-08-10 NOTE — Progress Notes (Signed)
Paramedicine Encounter    Patient ID: Gerald Nelson, male    DOB: Apr 30, 1951, 67 y.o.   MRN: [id number]    Patient Care Team: [name], MD as PCP - General (Internal Medicine)  Patient Active Problem List   Diagnosis Date Noted  . Noncompliance 04/23/2017  . Mitral regurgitation 04/23/2017  . Thrombus - possible apical thrombus 01/2017 04/23/2017  . Hyperlipidemia 04/23/2017  . Pulmonary hypertension ([place])   . SOB (shortness of breath)   . Palliative care by specialist   . Tobacco abuse 04/19/2017  . Acute respiratory failure with hypoxia ([place]) 01/27/2017  . Hypertensive urgency 01/27/2017  . CHF (congestive heart failure) ([place]) 01/26/2017  . CKD (chronic kidney disease), stage III ([place]) 01/04/2017  . Essential hypertension 01/04/2017  . CVA (cerebral infarction) 12/12/2013  . Hyponatremia 12/06/2012  . Hiatal hernia 12/06/2012  . Syncope 12/06/2012  . Schizophrenia ([place])     Current Outpatient Medications:  .  aspirin 81 MG EC tablet, TAKE 1 TABLET BY MOUTH EVERY DAY, Disp: 30 tablet, Rfl: 1 .  atorvastatin (LIPITOR) 40 MG tablet, TAKE 1 TABLET BY MOUTH EVERY DAY IN THE EVENING, Disp: 30 tablet, Rfl: 1 .  BIDIL 20-37.5 MG tablet, TAKE 2 TABLETS BY MOUTH 3 (THREE) TIMES DAILY., Disp: 180 tablet, Rfl: 1 .  carvedilol (COREG) 6.25 MG tablet, Take 1 tablet (6.25 mg total) by mouth 2 (two) times daily with a meal., Disp: 60 tablet, Rfl: 6 .  furosemide (LASIX) 40 MG tablet, Take 1 tablet (40 mg total) by mouth 2 (two) times daily., Disp: 60 tablet, Rfl: 6 .  haloperidol decanoate (HALDOL DECANOATE) 100 MG/ML injection, Inject 100 mg into the muscle every 30 (thirty) days. , Disp: , Rfl: 99 .  KLOR-CON M20 20 MEQ tablet, TAKE 1 TABLET BY MOUTH EVERY DAY, Disp: 30 tablet, Rfl: 1 .  losartan (COZAAR) 25 MG tablet, Take 1 tablet (25 mg total) by mouth daily., Disp: 90 tablet, Rfl: 3 .  spironolactone (ALDACTONE) 25 MG tablet, TAKE 1 TABLET BY MOUTH EVERY DAY, Disp: 30 tablet, Rfl:  11 .  albuterol (PROVENTIL HFA;VENTOLIN HFA) 108 (90 Base) MCG/ACT inhaler, Inhale 1-2 puffs into the lungs every 6 (six) hours as needed for wheezing or shortness of breath., Disp: , Rfl:  .  budesonide-formoterol (SYMBICORT) 160-4.5 MCG/ACT inhaler, Inhale 2 puffs into the lungs 2 (two) times daily., Disp: 1 Inhaler, Rfl: 6 .  nicotine (NICODERM CQ - DOSED IN MG/24 HOURS) 14 mg/24hr patch, Place 1 patch (14 mg total) onto the skin daily., Disp: 28 patch, Rfl: 0 .  Tiotropium Bromide Monohydrate (SPIRIVA RESPIMAT) 2.5 MCG/ACT AERS, Inhale 1 spray daily into the lungs., Disp: 1 Inhaler, Rfl: 0 Allergies  Allergen Reactions  . Sulfa Antibiotics Nausea Only     Social History   Socioeconomic History  . Marital status: Single    Spouse name: Not on file  . Number of children: Not on file  . Years of education: Not on file  . Highest education level: Not on file  Social Needs  . Financial resource strain: Not on file  . Food insecurity - worry: Not on file  . Food insecurity - inability: Not on file  . Transportation needs - medical: Not on file  . Transportation needs - non-medical: Not on file  Occupational History  . Not on file  Tobacco Use  . Smoking status: Current Every Day Smoker    Packs/day: 1.50    Years: 44.00    Pack  years: 66.00    Types: Cigarettes    Start date: 44  . Smokeless tobacco: Never Used  Substance and Sexual Activity  . Alcohol use: No  . Drug use: No  . Sexual activity: Not on file  Other Topics Concern  . Not on file  Social History Narrative  . Not on file    Physical Exam  Abdominal: He exhibits no distension. There is no tenderness. There is no rebound.  Musculoskeletal: He exhibits no edema.  Skin: Skin is warm and dry. No rash noted. He is not diaphoretic. No pallor.        Future Appointments  Date Time Provider [place]  08/11/2017  2:00 PM [facility] ECHO 4 [facility]  08/17/2017  9:00 AM [facility] PA/NP  [facility] None    ATF pt CAO x4 standing in the living room c/o N/V/D and fever.  Unknown how high his fever is, pt's skin is hot to touch. No one in his home has had these same symptoms. Pt stated that his symptoms started very early this am.  Pt hasn't taken anything for the fever and he did not get the flu shot.  He has taken his meds this morning and hasn't missed any this week.  He had an appointment with the heart falure clinic today but he rescheduled his appointment due to his symptoms.  He became nauseated during our visit but didn't have anything to throw up.  Pt's vitals noted. rx bottles verified and pill box refilled.   BP 118/70 (BP Location: Left Arm, Patient Position: Standing, Cuff Size: Normal)   Pulse 77   Wt 152 lb (68.9 kg)   SpO2 98%   BMI 21.81 kg/m   Weight yesterday-152 Last visit weight-153    Praveen Coia, EMT Paramedic 08/10/2017    ACTION: Home visit completed Next visit planned for next week

## 2017-08-11 ENCOUNTER — Ambulatory Visit (HOSPITAL_COMMUNITY): Payer: Medicare Other | Attending: Cardiovascular Disease

## 2017-08-11 ENCOUNTER — Other Ambulatory Visit: Payer: Self-pay

## 2017-08-11 DIAGNOSIS — N189 Chronic kidney disease, unspecified: Secondary | ICD-10-CM | POA: Insufficient documentation

## 2017-08-11 DIAGNOSIS — I130 Hypertensive heart and chronic kidney disease with heart failure and stage 1 through stage 4 chronic kidney disease, or unspecified chronic kidney disease: Secondary | ICD-10-CM | POA: Insufficient documentation

## 2017-08-11 DIAGNOSIS — Z720 Tobacco use: Secondary | ICD-10-CM | POA: Insufficient documentation

## 2017-08-11 DIAGNOSIS — I5022 Chronic systolic (congestive) heart failure: Secondary | ICD-10-CM

## 2017-08-11 DIAGNOSIS — Z8673 Personal history of transient ischemic attack (TIA), and cerebral infarction without residual deficits: Secondary | ICD-10-CM | POA: Diagnosis not present

## 2017-08-11 DIAGNOSIS — I071 Rheumatic tricuspid insufficiency: Secondary | ICD-10-CM | POA: Insufficient documentation

## 2017-08-16 ENCOUNTER — Telehealth (HOSPITAL_COMMUNITY): Payer: Self-pay | Admitting: Surgery

## 2017-08-16 NOTE — Telephone Encounter (Signed)
Patient called in reference to his upcoming appointment.  I called and he tells me that he has "already had it taken care of" and that appointment is rescheduled for Friday.

## 2017-08-17 ENCOUNTER — Encounter (HOSPITAL_COMMUNITY): Payer: Self-pay

## 2017-08-17 ENCOUNTER — Other Ambulatory Visit (HOSPITAL_COMMUNITY): Payer: Self-pay

## 2017-08-17 NOTE — Progress Notes (Signed)
Paramedicine Encounter    Patient ID: Gerald Nelson, male    DOB: 1951-06-29, 67 y.o.   MRN: [id number]    Patient Care Team: [name], MD as PCP - General (Internal Medicine)  Patient Active Problem List   Diagnosis Date Noted  . Noncompliance 04/23/2017  . Mitral regurgitation 04/23/2017  . Thrombus - possible apical thrombus 01/2017 04/23/2017  . Hyperlipidemia 04/23/2017  . Pulmonary hypertension ([place])   . SOB (shortness of breath)   . Palliative care by specialist   . Tobacco abuse 04/19/2017  . Acute respiratory failure with hypoxia ([place]) 01/27/2017  . Hypertensive urgency 01/27/2017  . CHF (congestive heart failure) ([place]) 01/26/2017  . CKD (chronic kidney disease), stage III ([place]) 01/04/2017  . Essential hypertension 01/04/2017  . CVA (cerebral infarction) 12/12/2013  . Hyponatremia 12/06/2012  . Hiatal hernia 12/06/2012  . Syncope 12/06/2012  . Schizophrenia ([place])     Current Outpatient Medications:  .  aspirin 81 MG EC tablet, TAKE 1 TABLET BY MOUTH EVERY DAY, Disp: 30 tablet, Rfl: 1 .  atorvastatin (LIPITOR) 40 MG tablet, TAKE 1 TABLET BY MOUTH EVERY DAY IN THE EVENING, Disp: 30 tablet, Rfl: 1 .  BIDIL 20-37.5 MG tablet, TAKE 2 TABLETS BY MOUTH 3 (THREE) TIMES DAILY., Disp: 180 tablet, Rfl: 1 .  furosemide (LASIX) 40 MG tablet, Take 1 tablet (40 mg total) by mouth 2 (two) times daily., Disp: 60 tablet, Rfl: 6 .  KLOR-CON M20 20 MEQ tablet, TAKE 1 TABLET BY MOUTH EVERY DAY, Disp: 30 tablet, Rfl: 1 .  losartan (COZAAR) 25 MG tablet, Take 1 tablet (25 mg total) by mouth daily., Disp: 90 tablet, Rfl: 3 .  spironolactone (ALDACTONE) 25 MG tablet, TAKE 1 TABLET BY MOUTH EVERY DAY, Disp: 30 tablet, Rfl: 11 .  albuterol (PROVENTIL HFA;VENTOLIN HFA) 108 (90 Base) MCG/ACT inhaler, Inhale 1-2 puffs into the lungs every 6 (six) hours as needed for wheezing or shortness of breath., Disp: , Rfl:  .  bisoprolol (ZEBETA) 5 MG tablet, Take 0.5 tablets (2.5 mg total) by mouth  daily., Disp: 45 tablet, Rfl: 3 .  budesonide-formoterol (SYMBICORT) 160-4.5 MCG/ACT inhaler, Inhale 2 puffs into the lungs 2 (two) times daily., Disp: 1 Inhaler, Rfl: 6 .  haloperidol decanoate (HALDOL DECANOATE) 100 MG/ML injection, Inject 100 mg into the muscle every 30 (thirty) days. , Disp: , Rfl: 99 .  nicotine (NICODERM CQ - DOSED IN MG/24 HOURS) 14 mg/24hr patch, Place 1 patch (14 mg total) onto the skin daily., Disp: 28 patch, Rfl: 0 .  Tiotropium Bromide Monohydrate (SPIRIVA RESPIMAT) 2.5 MCG/ACT AERS, Inhale 1 spray daily into the lungs., Disp: 1 Inhaler, Rfl: 0 Allergies  Allergen Reactions  . Sulfa Antibiotics Nausea Only     Social History   Socioeconomic History  . Marital status: Single    Spouse name: Not on file  . Number of children: Not on file  . Years of education: Not on file  . Highest education level: Not on file  Social Needs  . Financial resource strain: Not on file  . Food insecurity - worry: Not on file  . Food insecurity - inability: Not on file  . Transportation needs - medical: Not on file  . Transportation needs - non-medical: Not on file  Occupational History  . Not on file  Tobacco Use  . Smoking status: Current Every Day Smoker    Packs/day: 1.50    Years: 44.00    Pack years: 66.00    Types:  Cigarettes    Start date: 51  . Smokeless tobacco: Never Used  Substance and Sexual Activity  . Alcohol use: No  . Drug use: No  . Sexual activity: Not on file  Other Topics Concern  . Not on file  Social History Narrative  . Not on file    Physical Exam      Future Appointments  Date Time Provider [place]  09/22/2017  1:30 PM [facility] PA/NP [facility] None    ATF pt CAO x4 standing in the living room with no complaints.  Pt has taken all of his meds this am.  He stated that he feels a lot better since our last visit.  Pt stated that he hasn't had any diarrhea or stomach issues in over a week. He has no sob, dizziness, and chest  pain.  pts pill box refilled.  I will f/u to see pt at the heart clinic Friday.    Pulse 87   Resp 16   Wt 150 lb 9.6 oz (68.3 kg)   SpO2 97%   BMI 21.61 kg/m   **rx called in: Carvedilol filled until mon/none in tues Haldol losartatin klorcon Dasani [place], EMT Paramedic 08/22/2017    ACTION: Home visit completed

## 2017-08-18 ENCOUNTER — Telehealth (HOSPITAL_COMMUNITY): Payer: Self-pay

## 2017-08-18 NOTE — Telephone Encounter (Signed)
Result Notes for ECHOCARDIOGRAM COMPLETE   Notes recorded by Effie Berkshire, RN on 08/18/2017 at 12:28 PM EST Patient will discuss with provider during appt tomorrow, appt confirmed with patient by phone. ------  Notes recorded by Shirley Friar, PA-C on 08/18/2017 at 11:53 AM EST See below. Needs EP appt.     Gerald Nelson [address] [place], PA-C 08/18/2017 11:53 AM ------  Notes recorded by Shirley Friar, PA-C on 08/12/2017 at 11:55 AM EST EF remains depressed. Please send to EP for ICD consideration if patient willing.   Pt has not had ischemic work up, but is willing if determined to be candidate for ICD.   Gerald Nelson [address] [place], PA-C 08/12/2017 11:54 AM

## 2017-08-19 ENCOUNTER — Other Ambulatory Visit (HOSPITAL_COMMUNITY): Payer: Self-pay

## 2017-08-19 ENCOUNTER — Ambulatory Visit (HOSPITAL_COMMUNITY)
Admission: RE | Admit: 2017-08-19 | Discharge: 2017-08-19 | Disposition: A | Discharge status: Home / Self Care | Payer: Medicare Other | Source: Ambulatory Visit | Attending: Internal Medicine | Admitting: Internal Medicine

## 2017-08-19 ENCOUNTER — Encounter (HOSPITAL_COMMUNITY): Payer: Self-pay

## 2017-08-19 VITALS — BP 140/84 | HR 68 | Wt 150.0 lb

## 2017-08-19 DIAGNOSIS — N183 Chronic kidney disease, stage 3 unspecified: Secondary | ICD-10-CM

## 2017-08-19 DIAGNOSIS — F209 Schizophrenia, unspecified: Secondary | ICD-10-CM | POA: Diagnosis not present

## 2017-08-19 DIAGNOSIS — I5022 Chronic systolic (congestive) heart failure: Secondary | ICD-10-CM | POA: Insufficient documentation

## 2017-08-19 DIAGNOSIS — I272 Pulmonary hypertension, unspecified: Secondary | ICD-10-CM | POA: Diagnosis not present

## 2017-08-19 DIAGNOSIS — Z79899 Other long term (current) drug therapy: Secondary | ICD-10-CM | POA: Insufficient documentation

## 2017-08-19 DIAGNOSIS — I10 Essential (primary) hypertension: Secondary | ICD-10-CM | POA: Diagnosis not present

## 2017-08-19 DIAGNOSIS — I130 Hypertensive heart and chronic kidney disease with heart failure and stage 1 through stage 4 chronic kidney disease, or unspecified chronic kidney disease: Secondary | ICD-10-CM | POA: Insufficient documentation

## 2017-08-19 DIAGNOSIS — J449 Chronic obstructive pulmonary disease, unspecified: Secondary | ICD-10-CM | POA: Insufficient documentation

## 2017-08-19 DIAGNOSIS — Z7982 Long term (current) use of aspirin: Secondary | ICD-10-CM | POA: Diagnosis not present

## 2017-08-19 DIAGNOSIS — I340 Nonrheumatic mitral (valve) insufficiency: Secondary | ICD-10-CM | POA: Insufficient documentation

## 2017-08-19 DIAGNOSIS — E785 Hyperlipidemia, unspecified: Secondary | ICD-10-CM | POA: Insufficient documentation

## 2017-08-19 DIAGNOSIS — Z720 Tobacco use: Secondary | ICD-10-CM | POA: Diagnosis not present

## 2017-08-19 DIAGNOSIS — I700 Atherosclerosis of aorta: Secondary | ICD-10-CM | POA: Diagnosis not present

## 2017-08-19 DIAGNOSIS — F17210 Nicotine dependence, cigarettes, uncomplicated: Secondary | ICD-10-CM | POA: Diagnosis not present

## 2017-08-19 DIAGNOSIS — Z8673 Personal history of transient ischemic attack (TIA), and cerebral infarction without residual deficits: Secondary | ICD-10-CM | POA: Diagnosis not present

## 2017-08-19 MED ORDER — NICOTINE 14 MG/24HR TD PT24: 14.0000 mg | MEDICATED_PATCH | Freq: Every day | TRANSDERMAL | 0 refills | Status: DC

## 2017-08-19 MED ORDER — BISOPROLOL FUMARATE 5 MG PO TABS: 2.5000 mg | ORAL_TABLET | Freq: Every day | ORAL | 3 refills | Status: DC

## 2017-08-19 NOTE — Progress Notes (Signed)
Advanced Heart Failure Clinic Note    Primary Care: Dr. Deforest Hoyles   Primary Cardiologist: Dr. Harrington Challenger, Dr. Aundra Dubin   HPI: Gerald Nelson is a 67 y.o. male with a past medical history of chronic systolic CHF (EF 63% in July 2018), severe MR/TR, pulmonary HTN, CVA, CKD, schizophrenia, HTN and tobacco abuse.   Gerald Nelson was admitted to [facility] 7/2-01/06/17 for newly diagnosed acute systolic CHF. Echo showed LVEF 15%, severe global hypokinesis, moderate LVH, coarsetrabeculation of the LV apex with numerous false tendinae of the left ventricle, very stagnant blood flow at the LV apex withsmoke but no obvious LV thrombus - Definity contrast was given,again, noting stagnant apical blood flow - this could suggestrecent thrombus and certainly high risk for apical thrombusformation. Other findings include aortic sclerosis with mild AI,moderate to severe MR, moderate LAE, mild RAE, moderate to severeTR, moderate to severe pulmonary hypertension (RVSP 73 mmHg), dilated IVC, trivial posterior pericardial effusion - tamponade physiology could be excluded given the degree of pulmonaryhypertension.He was seen by Dr. Harrington Challenger during this admission who felt that he needed a L/RHC, but patient left AMA. When he left, he did not pick up any medications due to lack of money and only continued to take ASA. He lives in a boarding house with two room mates and lives off of social security. He does have medicare. The only medication he takes regularly is haldol given to him by his psychiatrist.He has left the hospital several times before and has limited therapeutic options because of social and psych issues and compliance. He left on 10/3 and has stayed at a boarding house since then.  He left AMA 04/07/17. He was seen back in the office by Dr. Percival Spanish on 04/18/2017. Reported continued DOE, cough, and leg edema. He reported he had been taking meds but this could not be verified. It was not clear that he was compliant with sodium  restriction (eating pasta and ground beef). Prior discharge weights were variable, reportedly 159 in our office the day of his visit with Dr. Percival Spanish (with measured weight of 156 on 10/16). Due to concern for significant volume overload he was admitted for further evaluation.   He diuresed 25 pounds on IV lasix. He refused cath. His clonidine and diltazem were stopped during this admission. Referred to paramedicine. Discharge weight 133 pounds.   He presents today for follow up. Last visit considered switch to [facility] but Creatinine > 2.0 so continued losartan. Continues to smoke 1 - 1.5 ppd. Taking all medication as directed, though will occasional miss his middle hydralazine dose (1-2 a week).  He is willing to consider ischemic work up if required for ICD. Understands EF remains low on ECho. Denies orthopnea or PND.  Denies SOB on flat ground or with ADLs, though SOB if in a hurry or up stairs. Slightly worse when he is having COPD flare up. Paramedicine follows at home.  - Echo 08/11/17 LVEF 15-20%, no MR noted.   Review of systems complete and found to be negative unless listed in HPI.    Past Medical History:  Diagnosis Date  . Apical mural thrombus    a. question of apical thrombus on echo 01/2017, pt left AMA, not felt to be anticoag candidate with noncompliance.  . Arthritis   . CHF (congestive heart failure) ([place])   . Chronic systolic CHF (congestive heart failure) ([place])    a. pt refused cath. EF 15% 01/2017.  . CKD (chronic kidney disease), stage III ([place])   .  COPD (chronic obstructive pulmonary disease) ([place])   . Hernia, hiatal   . Hyperlipidemia   . Hypertension   . Hyponatremia   . Mitral regurgitation    a. mod-severe by echo 01/2017.  . Pulmonary hypertension ([place])   . Schizophrenia ([place])   . Tobacco abuse     Current Outpatient Medications  Medication Sig Dispense Refill  . albuterol (PROVENTIL HFA;VENTOLIN HFA) 108 (90 Base) MCG/ACT inhaler Inhale 1-2 puffs into the  lungs every 6 (six) hours as needed for wheezing or shortness of breath.    Marland Kitchen aspirin 81 MG EC tablet TAKE 1 TABLET BY MOUTH EVERY DAY 30 tablet 1  . atorvastatin (LIPITOR) 40 MG tablet TAKE 1 TABLET BY MOUTH EVERY DAY IN THE EVENING 30 tablet 1  . BIDIL 20-37.5 MG tablet TAKE 2 TABLETS BY MOUTH 3 (THREE) TIMES DAILY. 180 tablet 1  . budesonide-formoterol (SYMBICORT) 160-4.5 MCG/ACT inhaler Inhale 2 puffs into the lungs 2 (two) times daily. 1 Inhaler 6  . furosemide (LASIX) 40 MG tablet Take 1 tablet (40 mg total) by mouth 2 (two) times daily. 60 tablet 6  . haloperidol decanoate (HALDOL DECANOATE) 100 MG/ML injection Inject 100 mg into the muscle every 30 (thirty) days.   99  . KLOR-CON M20 20 MEQ tablet TAKE 1 TABLET BY MOUTH EVERY DAY 30 tablet 1  . losartan (COZAAR) 25 MG tablet Take 1 tablet (25 mg total) by mouth daily. 90 tablet 3  . nicotine (NICODERM CQ - DOSED IN MG/24 HOURS) 14 mg/24hr patch Place 1 patch (14 mg total) onto the skin daily. 28 patch 0  . spironolactone (ALDACTONE) 25 MG tablet TAKE 1 TABLET BY MOUTH EVERY DAY 30 tablet 11  . Tiotropium Bromide Monohydrate (SPIRIVA RESPIMAT) 2.5 MCG/ACT AERS Inhale 1 spray daily into the lungs. 1 Inhaler 0  . bisoprolol (ZEBETA) 5 MG tablet Take 0.5 tablets (2.5 mg total) by mouth daily. 45 tablet 3   No current facility-administered medications for this encounter.    Allergies  Allergen Reactions  . Sulfa Antibiotics Nausea Only   Social History   Socioeconomic History  . Marital status: Single    Spouse name: Not on file  . Number of children: Not on file  . Years of education: Not on file  . Highest education level: Not on file  Social Needs  . Financial resource strain: Not on file  . Food insecurity - worry: Not on file  . Food insecurity - inability: Not on file  . Transportation needs - medical: Not on file  . Transportation needs - non-medical: Not on file  Occupational History  . Not on file  Tobacco Use  .  Smoking status: Current Every Day Smoker    Packs/day: 1.50    Years: 44.00    Pack years: 66.00    Types: Cigarettes    Start date: 66  . Smokeless tobacco: Never Used  Substance and Sexual Activity  . Alcohol use: No  . Drug use: No  . Sexual activity: Not on file  Other Topics Concern  . Not on file  Social History Narrative  . Not on file      Family History  Problem Relation Age of Onset  . Hypertension Mother   . Hypertension Father    Vitals:   08/19/17 0846  BP: 140/84  Pulse: 68  SpO2: 98%  Weight: 150 lb (68 kg)   Wt Readings from Last 3 Encounters:  08/19/17 150 lb 4.8 oz (68.2 kg)  08/19/17 150 lb (68 kg)  08/17/17 150 lb 9.6 oz (68.3 kg)   PHYSICAL EXAM: General: Appears older than stated age. NAD. Strong smell of tobacco.  HEENT: Normal Neck: Supple. JVP 6-7. Carotids 2+ bilat; no bruits. No thyromegaly or nodule noted. Cor: PMI nondisplaced. RRR, 2/6 SEM Lungs: Diminished throughout with scant wheezing.  Abdomen: Soft, non-tender, non-distended, no HSM. No bruits or masses. +BS  Extremities: No cyanosis, clubbing, or rash. No edema. Neuro: Alert & orientedx3, cranial nerves grossly intact. moves all 4 extremities w/o difficulty. Affect pleasant   ASSESSMENT & PLAN: 1. Chronic systolic CHF: Echo 01/9149 EF 15%.  - Echo 08/11/17 LVEF 15-20%, no MR noted.  - NYHA III in setting of recent COPD exacerbation.  - Volume status stable on exam.  - Continue lasix 40 mg BID.  - Per pulm request, will stop Coreg, and start on bisoprol 2.5 mg daily. Consider increase to 5 at next visit.  - Continue losartan 25 mg daily. Not candidate for [facility] with CKD.  - Continue spiro 25 mg daily. BMET today.  - He is willing to be considered for ICD. He knows that they may require him to get ischemic work up, to determine prognosis if considered for ICD. Will send to EP. He is willing to consider cath if needed, but now creatinine may prohibit.  - Reinforced fluid  restriction to < 2 L daily, sodium restriction to less than 2000 mg daily, and the importance of daily weights.    2. Questionable apical thrombus:  - Poor anticoagulation candidate. No change..   3. MR - Mod/Sev on last echo. Suspect functional.  - Not noted at all on Echo 08/11/17. Unclear why this discrepancy.  Will ask MD to review Echo.   4. Tobacco abuse - Continues to smoke > 1 ppd.  - Will re-send rx for nicotine patches to [facility].  5. CKD III:  - Repeat BMET today.   6. SCHIZOPHRENIA:  - Follows with PCP.   7. HTN - Stable on meds as above.   8. COPD - Recently treated for exacerbation. Lungs sounds still diminished.   - Encouraged complete smoking cessation.   Meds and labs as above.   Shirley Friar, PA-C 08/19/17   Greater than 50% of the 25 minute visit was spent in counseling/coordination of care regarding disease state education, salt/fluid restriction, sliding scale diuretics, and medication compliance.

## 2017-08-19 NOTE — Progress Notes (Signed)
Paramedicine Encounter   Patient ID: Gerald Nelson , male,   DOB: 1950-09-30,66 y.o.,  MRN: [id number]   Met patient in clinic today with provider Gerald Nelson.   Pt took his meds prior to our visit.  Pt is still smoking a pack and half of cigarettes a day. Gerald Nelson talked with him about the fluid intake.  Due to him not retaining fluid, its ok that he continues They will talk with him about a defiberator.    Time spent with patient 64mns  **rx change during this visit: stop carvedilol Start bisprolol  Gerald Nelson, EMT-Paramedic 08/19/2017   ACTION: Next visit planned for next monday

## 2017-08-19 NOTE — Patient Instructions (Signed)
STOP Carvedilol (Coreg).  START Bisoprolol 2.5 mg (1/2 tablet) once daily.  RESTART Nicotine patches as directed on packaging.  Will refer you to electrophysiology at [facility]. Address: [address] ([place]), [place], [place] [address]  Phone: [phone] Their office will contact you by phone to schedule.  Follow up 5 weeks with Gerald Kilts PA-C.  __________________________________________________________ Gerald Nelson Code: 9002  Take all medication as prescribed the day of your appointment. Bring all medications with you to your appointment.  Do the following things EVERYDAY: 1) Weigh yourself in the morning before breakfast. Write it down and keep it in a log. 2) Take your medicines as prescribed 3) Eat low salt foods-Limit salt (sodium) to 2000 mg per day.  4) Stay as active as you can everyday 5) Limit all fluids for the day to less than 2 liters

## 2017-08-22 ENCOUNTER — Other Ambulatory Visit: Payer: Self-pay | Admitting: Internal Medicine

## 2017-08-22 NOTE — Telephone Encounter (Signed)
REFILL 

## 2017-08-22 NOTE — Telephone Encounter (Signed)
Dr. Minus Breeding patient

## 2017-08-23 ENCOUNTER — Other Ambulatory Visit: Payer: Self-pay | Admitting: Internal Medicine

## 2017-08-23 ENCOUNTER — Other Ambulatory Visit (HOSPITAL_COMMUNITY): Payer: Self-pay

## 2017-08-23 NOTE — Progress Notes (Signed)
Paramedicine Encounter    Patient ID: Gerald Nelson, male    DOB: [birth year], 67 y.o.   MRN: [id number]    Patient Care Team: Gerald Low, MD as PCP - General (Internal Medicine)  Patient Active Problem List   Diagnosis Date Noted  . Noncompliance 04/23/2017  . Mitral regurgitation 04/23/2017  . Thrombus - possible apical thrombus 01/2017 04/23/2017  . Hyperlipidemia 04/23/2017  . Pulmonary hypertension ([place])   . SOB (shortness of breath)   . Palliative care by specialist   . Tobacco abuse 04/19/2017  . Acute respiratory failure with hypoxia ([place]) 01/27/2017  . Hypertensive urgency 01/27/2017  . CHF (congestive heart failure) ([place]) 01/26/2017  . CKD (chronic kidney disease), stage III ([place]) 01/04/2017  . Essential hypertension 01/04/2017  . CVA (cerebral infarction) 12/12/2013  . Hyponatremia 12/06/2012  . Hiatal hernia 12/06/2012  . Syncope 12/06/2012  . Schizophrenia ([place])     Current Outpatient Medications:  .  aspirin 81 MG EC tablet, TAKE 1 TABLET BY MOUTH EVERY DAY, Disp: 30 tablet, Rfl: 1 .  atorvastatin (LIPITOR) 40 MG tablet, TAKE 1 TABLET BY MOUTH EVERY DAY IN THE EVENING, Disp: 30 tablet, Rfl: 6 .  BIDIL 20-37.5 MG tablet, TAKE 2 TABLETS BY MOUTH 3 (THREE) TIMES DAILY., Disp: 180 tablet, Rfl: 1 .  bisoprolol (ZEBETA) 5 MG tablet, Take 0.5 tablets (2.5 mg total) by mouth daily., Disp: 45 tablet, Rfl: 3 .  furosemide (LASIX) 40 MG tablet, Take 1 tablet (40 mg total) by mouth 2 (two) times daily., Disp: 60 tablet, Rfl: 6 .  KLOR-CON M20 20 MEQ tablet, TAKE 1 TABLET BY MOUTH EVERY DAY, Disp: 30 tablet, Rfl: 6 .  losartan (COZAAR) 25 MG tablet, Take 1 tablet (25 mg total) by mouth daily., Disp: 90 tablet, Rfl: 3 .  spironolactone (ALDACTONE) 25 MG tablet, TAKE 1 TABLET BY MOUTH EVERY DAY, Disp: 30 tablet, Rfl: 11 .  albuterol (PROVENTIL HFA;VENTOLIN HFA) 108 (90 Base) MCG/ACT inhaler, Inhale 1-2 puffs into the lungs every 6 (six) hours as needed for wheezing or  shortness of breath., Disp: , Rfl:  .  budesonide-formoterol (SYMBICORT) 160-4.5 MCG/ACT inhaler, Inhale 2 puffs into the lungs 2 (two) times daily., Disp: 1 Inhaler, Rfl: 6 .  haloperidol decanoate (HALDOL DECANOATE) 100 MG/ML injection, Inject 100 mg into the muscle every 30 (thirty) days. , Disp: , Rfl: 99 .  nicotine (NICODERM CQ - DOSED IN MG/24 HOURS) 14 mg/24hr patch, Place 1 patch (14 mg total) onto the skin daily., Disp: 28 patch, Rfl: 0 .  Tiotropium Bromide Monohydrate (SPIRIVA RESPIMAT) 2.5 MCG/ACT AERS, Inhale 1 spray daily into the lungs., Disp: 1 Inhaler, Rfl: 0 Allergies  Allergen Reactions  . Sulfa Antibiotics Nausea Only     Social History   Socioeconomic History  . Marital status: Single    Spouse name: Not on file  . Number of children: Not on file  . Years of education: Not on file  . Highest education level: Not on file  Social Needs  . Financial resource strain: Not on file  . Food insecurity - worry: Not on file  . Food insecurity - inability: Not on file  . Transportation needs - medical: Not on file  . Transportation needs - non-medical: Not on file  Occupational History  . Not on file  Tobacco Use  . Smoking status: Current Every Day Smoker    Packs/day: 1.50    Years: 44.00    Pack years: 66.00    Types:  Cigarettes    Start date: 62  . Smokeless tobacco: Never Used  Substance and Sexual Activity  . Alcohol use: No  . Drug use: No  . Sexual activity: Not on file  Other Topics Concern  . Not on file  Social History Narrative  . Not on file    Physical Exam  Pulmonary/Chest: No respiratory distress. He has no wheezes.  Abdominal: He exhibits no distension. There is no tenderness.  Musculoskeletal: He exhibits no edema.  Skin: Skin is warm and dry. He is not diaphoretic.        Future Appointments  Date Time Provider [place]  09/22/2017  1:30 PM [facility] PA/NP [facility] None    ATF pt CAO x4 standing in the living room with  no complaints.  Pt's house is very cold, he stated that it was working last night but it is around 56 degrees.  Pt denies sob, chest pain and dizziness. He has his taken all of his medications this week. Todays visit is to refill his pill box early due to me going out of town for the week. rx bottles verified and pill box refilled.   Wt 148 lb 12.8 oz (67.5 kg)   BMI 21.35 kg/m   **rx called in: Losartan filled until mon/none in tues klor con  bidil (pharmacy must check  Gerald Nelson, EMT Paramedic 08/23/2017    ACTION: Home visit completed

## 2017-08-23 NOTE — Telephone Encounter (Signed)
This is a CHF pt 

## 2017-08-31 ENCOUNTER — Encounter (HOSPITAL_COMMUNITY): Payer: Self-pay

## 2017-08-31 ENCOUNTER — Other Ambulatory Visit (HOSPITAL_COMMUNITY): Payer: Self-pay

## 2017-08-31 NOTE — Progress Notes (Signed)
Paramedicine Encounter    Patient ID: Gerald Nelson, male    DOB: 1951-01-21, 67 y.o.   MRN: [id number]    Patient Care Team: [name], MD as PCP - General (Internal Medicine)  Patient Active Problem List   Diagnosis Date Noted  . Noncompliance 04/23/2017  . Mitral regurgitation 04/23/2017  . Thrombus - possible apical thrombus 01/2017 04/23/2017  . Hyperlipidemia 04/23/2017  . Pulmonary hypertension ([place])   . SOB (shortness of breath)   . Palliative care by specialist   . Tobacco abuse 04/19/2017  . Acute respiratory failure with hypoxia ([place]) 01/27/2017  . Hypertensive urgency 01/27/2017  . CHF (congestive heart failure) ([place]) 01/26/2017  . CKD (chronic kidney disease), stage III ([place]) 01/04/2017  . Essential hypertension 01/04/2017  . CVA (cerebral infarction) 12/12/2013  . Hyponatremia 12/06/2012  . Hiatal hernia 12/06/2012  . Syncope 12/06/2012  . Schizophrenia ([place])     Current Outpatient Medications:  .  aspirin 81 MG EC tablet, TAKE 1 TABLET BY MOUTH EVERY DAY, Disp: 30 tablet, Rfl: 1 .  atorvastatin (LIPITOR) 40 MG tablet, TAKE 1 TABLET BY MOUTH EVERY DAY IN THE EVENING, Disp: 30 tablet, Rfl: 6 .  BIDIL 20-37.5 MG tablet, TAKE 2 TABLETS BY MOUTH 3 (THREE) TIMES DAILY., Disp: 180 tablet, Rfl: 1 .  bisoprolol (ZEBETA) 5 MG tablet, Take 0.5 tablets (2.5 mg total) by mouth daily., Disp: 45 tablet, Rfl: 3 .  furosemide (LASIX) 40 MG tablet, Take 1 tablet (40 mg total) by mouth 2 (two) times daily., Disp: 60 tablet, Rfl: 6 .  spironolactone (ALDACTONE) 25 MG tablet, TAKE 1 TABLET BY MOUTH EVERY DAY, Disp: 30 tablet, Rfl: 11 .  albuterol (PROVENTIL HFA;VENTOLIN HFA) 108 (90 Base) MCG/ACT inhaler, Inhale 1-2 puffs into the lungs every 6 (six) hours as needed for wheezing or shortness of breath., Disp: , Rfl:  .  budesonide-formoterol (SYMBICORT) 160-4.5 MCG/ACT inhaler, Inhale 2 puffs into the lungs 2 (two) times daily., Disp: 1 Inhaler, Rfl: 6 .  haloperidol decanoate  (HALDOL DECANOATE) 100 MG/ML injection, Inject 100 mg into the muscle every 30 (thirty) days. , Disp: , Rfl: 99 .  KLOR-CON M20 20 MEQ tablet, TAKE 1 TABLET BY MOUTH EVERY DAY, Disp: 30 tablet, Rfl: 6 .  losartan (COZAAR) 25 MG tablet, Take 1 tablet (25 mg total) by mouth daily., Disp: 90 tablet, Rfl: 3 .  nicotine (NICODERM CQ - DOSED IN MG/24 HOURS) 14 mg/24hr patch, Place 1 patch (14 mg total) onto the skin daily., Disp: 28 patch, Rfl: 0 .  Tiotropium Bromide Monohydrate (SPIRIVA RESPIMAT) 2.5 MCG/ACT AERS, Inhale 1 spray daily into the lungs., Disp: 1 Inhaler, Rfl: 0 Allergies  Allergen Reactions  . Sulfa Antibiotics Nausea Only     Social History   Socioeconomic History  . Marital status: Single    Spouse name: Not on file  . Number of children: Not on file  . Years of education: Not on file  . Highest education level: Not on file  Social Needs  . Financial resource strain: Not on file  . Food insecurity - worry: Not on file  . Food insecurity - inability: Not on file  . Transportation needs - medical: Not on file  . Transportation needs - non-medical: Not on file  Occupational History  . Not on file  Tobacco Use  . Smoking status: Current Every Day Smoker    Packs/day: 1.50    Years: 44.00    Pack years: 66.00    Types:  Cigarettes    Start date: 70  . Smokeless tobacco: Never Used  Substance and Sexual Activity  . Alcohol use: No  . Drug use: No  . Sexual activity: Not on file  Other Topics Concern  . Not on file  Social History Narrative  . Not on file    Physical Exam  Pulmonary/Chest: No respiratory distress.  Abdominal: He exhibits no distension. There is no tenderness. There is no guarding.  Skin: Skin is warm and dry. He is not diaphoretic.        Future Appointments  Date Time Provider [place]  09/22/2017  1:30 PM [facility] PA/NP [facility] None    ATF pt CAO x4 standing in the living room with no complaints. He hasn't taken his morning  meds as of yet but he took last night pm meds.  The house is very cold, pt and his room mates stated that the landlord is aware of the heating issue.  Pt stated that he hasn't been doing much this week because he is currently out of money.  Pt is missing several of his meds.  I advised that he began using [facility] to deliver his medications due to his limited transportation.  Pt has been eating a lot of home cooked foods with no sodium.  He continues to watch what he is eating and drinking. Pt is still smoking; he can't afford nicoten at this time.  Pt denies sob, dizziness and chest pain.  rx bottles verified and pill box refilled.  Ill f/u with pt on Friday for med rec.   Pt's landlord arrived on scene during my visit and went to the back of the house to turn the heat back on.  I'm unsure if the heat is turned off in the mornings and turned back on at night.    BP (!) 160/100 (BP Location: Left Arm, Patient Position: Standing, Cuff Size: Normal)   Pulse 88   Resp 16   Wt 153 lb 6.4 oz (69.6 kg)   SpO2 92%   BMI 22.01 kg/m   Weight yesterday- Last visit weight-149lb    Annmargaret Decaprio, EMT Paramedic 08/31/2017    **rx called in:  bidil filled until sat/none in sun-wed(next week) Lasix filled until wed morning klor con (out) losartatn (out) Atorvastatin haldol  ACTION: Home visit completed

## 2017-09-02 ENCOUNTER — Other Ambulatory Visit (HOSPITAL_COMMUNITY): Payer: Self-pay

## 2017-09-02 MED ORDER — ISOSORB DINITRATE-HYDRALAZINE 20-37.5 MG PO TABS: ORAL_TABLET | ORAL | 1 refills | Status: DC

## 2017-09-07 ENCOUNTER — Other Ambulatory Visit (HOSPITAL_COMMUNITY): Payer: Self-pay

## 2017-09-07 NOTE — Progress Notes (Signed)
Paramedicine Encounter    Patient ID: Gerald Nelson, male    DOB: 1951-02-13, 67 y.o.   MRN: [id number]    Patient Care Team: [name], MD as PCP - General (Internal Medicine)  Patient Active Problem List   Diagnosis Date Noted  . Noncompliance 04/23/2017  . Mitral regurgitation 04/23/2017  . Thrombus - possible apical thrombus 01/2017 04/23/2017  . Hyperlipidemia 04/23/2017  . Pulmonary hypertension ([place])   . SOB (shortness of breath)   . Palliative care by specialist   . Tobacco abuse 04/19/2017  . Acute respiratory failure with hypoxia ([place]) 01/27/2017  . Hypertensive urgency 01/27/2017  . CHF (congestive heart failure) ([place]) 01/26/2017  . CKD (chronic kidney disease), stage III ([place]) 01/04/2017  . Essential hypertension 01/04/2017  . CVA (cerebral infarction) 12/12/2013  . Hyponatremia 12/06/2012  . Hiatal hernia 12/06/2012  . Syncope 12/06/2012  . Schizophrenia ([place])     Current Outpatient Medications:  .  aspirin 81 MG EC tablet, TAKE 1 TABLET BY MOUTH EVERY DAY, Disp: 30 tablet, Rfl: 1 .  atorvastatin (LIPITOR) 40 MG tablet, TAKE 1 TABLET BY MOUTH EVERY DAY IN THE EVENING, Disp: 30 tablet, Rfl: 6 .  furosemide (LASIX) 40 MG tablet, Take 1 tablet (40 mg total) by mouth 2 (two) times daily., Disp: 60 tablet, Rfl: 6 .  haloperidol decanoate (HALDOL DECANOATE) 100 MG/ML injection, Inject 100 mg into the muscle every 30 (thirty) days. , Disp: , Rfl: 99 .  isosorbide-hydrALAZINE (BIDIL) 20-37.5 MG tablet, TAKE 2 TABLETS BY MOUTH 3 (THREE) TIMES DAILY., Disp: 180 tablet, Rfl: 1 .  KLOR-CON M20 20 MEQ tablet, TAKE 1 TABLET BY MOUTH EVERY DAY, Disp: 30 tablet, Rfl: 6 .  losartan (COZAAR) 25 MG tablet, Take 1 tablet (25 mg total) by mouth daily., Disp: 90 tablet, Rfl: 3 .  spironolactone (ALDACTONE) 25 MG tablet, TAKE 1 TABLET BY MOUTH EVERY DAY, Disp: 30 tablet, Rfl: 11 .  albuterol (PROVENTIL HFA;VENTOLIN HFA) 108 (90 Base) MCG/ACT inhaler, Inhale 1-2 puffs into the lungs  every 6 (six) hours as needed for wheezing or shortness of breath., Disp: , Rfl:  .  bisoprolol (ZEBETA) 5 MG tablet, Take 0.5 tablets (2.5 mg total) by mouth daily., Disp: 45 tablet, Rfl: 3 .  budesonide-formoterol (SYMBICORT) 160-4.5 MCG/ACT inhaler, Inhale 2 puffs into the lungs 2 (two) times daily., Disp: 1 Inhaler, Rfl: 6 .  nicotine (NICODERM CQ - DOSED IN MG/24 HOURS) 14 mg/24hr patch, Place 1 patch (14 mg total) onto the skin daily., Disp: 28 patch, Rfl: 0 .  Tiotropium Bromide Monohydrate (SPIRIVA RESPIMAT) 2.5 MCG/ACT AERS, Inhale 1 spray daily into the lungs., Disp: 1 Inhaler, Rfl: 0 Allergies  Allergen Reactions  . Sulfa Antibiotics Nausea Only     Social History   Socioeconomic History  . Marital status: Single    Spouse name: Not on file  . Number of children: Not on file  . Years of education: Not on file  . Highest education level: Not on file  Social Needs  . Financial resource strain: Not on file  . Food insecurity - worry: Not on file  . Food insecurity - inability: Not on file  . Transportation needs - medical: Not on file  . Transportation needs - non-medical: Not on file  Occupational History  . Not on file  Tobacco Use  . Smoking status: Current Every Day Smoker    Packs/day: 1.50    Years: 44.00    Pack years: 66.00  Types: Cigarettes    Start date: 27  . Smokeless tobacco: Never Used  Substance and Sexual Activity  . Alcohol use: No  . Drug use: No  . Sexual activity: Not on file  Other Topics Concern  . Not on file  Social History Narrative  . Not on file    Physical Exam  Pulmonary/Chest: No respiratory distress.  Abdominal: He exhibits no distension. There is no tenderness.  Musculoskeletal: He exhibits no edema.  Skin: Skin is warm and dry. He is not diaphoretic.        Future Appointments  Date Time Provider [place]  09/14/2017  2:30 PM Thompson Grayer, MD [facility]  09/22/2017  1:30 PM [facility] PA/NP  [facility] None    ATF pt CAO x4 standing in the livingroom, smoking a cigarette.  Pt stated that he hasn't taken any of his meds today.  Pt was out of several meds that I picked up earlier.  Pt denies sob, chest pain and dizziness.  Pt still cant afford the nicotin patches that were prescribed to him.  Pt stated that he plans on boiling some [place] legs for dinner and potatoes.  rx bottles verified and pill box refilled.    BP (!) 148/98 (BP Location: Left Arm, Patient Position: Sitting, Cuff Size: Normal)   Pulse 96   Resp 16   Wt 153 lb (69.4 kg)   SpO2 95%   BMI 21.95 kg/m   Weight yesterday-153 Last visit weight-153    Avianna Moynahan, EMT Paramedic 09/07/2017    ACTION: Home visit completed

## 2017-09-13 ENCOUNTER — Other Ambulatory Visit (HOSPITAL_COMMUNITY): Payer: Self-pay

## 2017-09-13 ENCOUNTER — Encounter (HOSPITAL_COMMUNITY): Payer: Self-pay

## 2017-09-13 NOTE — Progress Notes (Signed)
Paramedicine Encounter    Patient ID: Gerald Nelson, male    DOB: [birth year], 67 y.o.   MRN: [id number]    Patient Care Team: [name], MD as PCP - General (Internal Medicine)  Patient Active Problem List   Diagnosis Date Noted  . Noncompliance 04/23/2017  . Mitral regurgitation 04/23/2017  . Thrombus - possible apical thrombus 01/2017 04/23/2017  . Hyperlipidemia 04/23/2017  . Pulmonary hypertension ([place])   . SOB (shortness of breath)   . Palliative care by specialist   . Tobacco abuse 04/19/2017  . Acute respiratory failure with hypoxia ([place]) 01/27/2017  . Hypertensive urgency 01/27/2017  . CHF (congestive heart failure) ([place]) 01/26/2017  . CKD (chronic kidney disease), stage III ([place]) 01/04/2017  . Essential hypertension 01/04/2017  . CVA (cerebral infarction) 12/12/2013  . Hyponatremia 12/06/2012  . Hiatal hernia 12/06/2012  . Syncope 12/06/2012  . Schizophrenia ([place])     Current Outpatient Medications:  .  albuterol (PROVENTIL HFA;VENTOLIN HFA) 108 (90 Base) MCG/ACT inhaler, Inhale 1-2 puffs into the lungs every 6 (six) hours as needed for wheezing or shortness of breath., Disp: , Rfl:  .  aspirin 81 MG EC tablet, TAKE 1 TABLET BY MOUTH EVERY DAY, Disp: 30 tablet, Rfl: 1 .  atorvastatin (LIPITOR) 40 MG tablet, TAKE 1 TABLET BY MOUTH EVERY DAY IN THE EVENING, Disp: 30 tablet, Rfl: 6 .  bisoprolol (ZEBETA) 5 MG tablet, Take 0.5 tablets (2.5 mg total) by mouth daily., Disp: 45 tablet, Rfl: 3 .  furosemide (LASIX) 40 MG tablet, Take 1 tablet (40 mg total) by mouth 2 (two) times daily., Disp: 60 tablet, Rfl: 6 .  haloperidol decanoate (HALDOL DECANOATE) 100 MG/ML injection, Inject 100 mg into the muscle every 30 (thirty) days. , Disp: , Rfl: 99 .  isosorbide-hydrALAZINE (BIDIL) 20-37.5 MG tablet, TAKE 2 TABLETS BY MOUTH 3 (THREE) TIMES DAILY., Disp: 180 tablet, Rfl: 1 .  KLOR-CON M20 20 MEQ tablet, TAKE 1 TABLET BY MOUTH EVERY DAY, Disp: 30 tablet, Rfl: 6 .  losartan  (COZAAR) 25 MG tablet, Take 1 tablet (25 mg total) by mouth daily., Disp: 90 tablet, Rfl: 3 .  spironolactone (ALDACTONE) 25 MG tablet, TAKE 1 TABLET BY MOUTH EVERY DAY, Disp: 30 tablet, Rfl: 11 .  budesonide-formoterol (SYMBICORT) 160-4.5 MCG/ACT inhaler, Inhale 2 puffs into the lungs 2 (two) times daily., Disp: 1 Inhaler, Rfl: 6 .  nicotine (NICODERM CQ - DOSED IN MG/24 HOURS) 14 mg/24hr patch, Place 1 patch (14 mg total) onto the skin daily., Disp: 28 patch, Rfl: 0 .  Tiotropium Bromide Monohydrate (SPIRIVA RESPIMAT) 2.5 MCG/ACT AERS, Inhale 1 spray daily into the lungs., Disp: 1 Inhaler, Rfl: 0 Allergies  Allergen Reactions  . Sulfa Antibiotics Nausea Only     Social History   Socioeconomic History  . Marital status: Single    Spouse name: Not on file  . Number of children: Not on file  . Years of education: Not on file  . Highest education level: Not on file  Social Needs  . Financial resource strain: Not on file  . Food insecurity - worry: Not on file  . Food insecurity - inability: Not on file  . Transportation needs - medical: Not on file  . Transportation needs - non-medical: Not on file  Occupational History  . Not on file  Tobacco Use  . Smoking status: Current Every Day Smoker    Packs/day: 1.50    Years: 44.00    Pack years: 66.00  Types: Cigarettes    Start date: 10  . Smokeless tobacco: Never Used  Substance and Sexual Activity  . Alcohol use: No  . Drug use: No  . Sexual activity: Not on file  Other Topics Concern  . Not on file  Social History Narrative  . Not on file    Physical Exam  Pulmonary/Chest: No respiratory distress.  Abdominal: He exhibits no distension. There is no tenderness.  Musculoskeletal: He exhibits no edema.  Skin: Skin is warm and dry. He is not diaphoretic.        Future Appointments  Date Time Provider [place]  09/14/2017  2:30 PM Thompson Grayer, MD [facility]  09/22/2017  1:30 PM [facility]  PA/NP [facility] None    ATF pt CAO x4 standing in the livingroom smoking a cigarette with no complaints.  He stated that he has an appointment tomorrow with the specialist to talk about the defib.  Pt stated that he is "nervous about the procedure but he is ready to go back to work so he'll see what Dr. Volney American says after this procedure".  Pt has taken all of his meds this week, I came early because of his consultation tomorrow.  Pt denies sob, chest pain and dizziness.  He's still smoking cigarettes, he tried getting nicotin but he can't afford it.  Pt's vitals noted.  rx bottles verified and pill box refilled.   BP 102/78 (BP Location: Left Arm, Patient Position: Standing, Cuff Size: Normal)   Pulse 88   Resp 16   Wt 152 lb 11.2 oz (69.3 kg)   SpO2 98%   BMI 21.91 kg/m   Weight yesterday-152 Last visit weight-153    Caren Garske, EMT Paramedic 09/13/2017    ACTION: Home visit completed Next visit planned for next week

## 2017-09-14 ENCOUNTER — Encounter: Payer: Self-pay | Admitting: Internal Medicine

## 2017-09-14 ENCOUNTER — Ambulatory Visit (INDEPENDENT_AMBULATORY_CARE_PROVIDER_SITE_OTHER): Payer: Medicare Other | Admitting: Internal Medicine

## 2017-09-14 VITALS — BP 120/60 | HR 91 | Ht 70.0 in | Wt 152.0 lb

## 2017-09-14 DIAGNOSIS — Z720 Tobacco use: Secondary | ICD-10-CM | POA: Diagnosis not present

## 2017-09-14 DIAGNOSIS — I5022 Chronic systolic (congestive) heart failure: Secondary | ICD-10-CM | POA: Diagnosis not present

## 2017-09-14 DIAGNOSIS — F209 Schizophrenia, unspecified: Secondary | ICD-10-CM | POA: Diagnosis not present

## 2017-09-14 NOTE — Progress Notes (Signed)
Thank you :)

## 2017-09-14 NOTE — Progress Notes (Signed)
Electrophysiology Office Note   Date:  09/14/2017   ID:  Gerald Nelson, DOB [birth year], MRN [id number]  PCP:  [name], MD  Cardiologist:  CHF clinic  CC: CHF   History of Present Illness: Gerald Nelson is a 67 y.o. male who presents today for electrophysiology evaluation.   He is referred by Oda Kilts for Ep consultation regarding risk stratification of sudden death. The patient has schizophrenia.  He lives in a boarding house.  He has had multiple hospitalizations during which he has left AMA.  He has had significant difficulty with medical noncompliance. He smokes heavily and smells of tobacco today.  He has severe COPD.  He has severe biventricular failure with severe pulmonary hypertension, severe TR and moderate to severe MR.  He has significant social, psych, and compliance issues.  Today, he denies symptoms of palpitations, chest pain lower extremity edema, claudication, dizziness, presyncope, syncope, bleeding, or neurologic sequela. The patient is tolerating medications without difficulties and is otherwise without complaint today.    Past Medical History:  Diagnosis Date  . Apical mural thrombus    a. question of apical thrombus on echo 01/2017, pt left AMA, not felt to be anticoag candidate with noncompliance.  . Arthritis   . CHF (congestive heart failure) ([place])   . Chronic systolic CHF (congestive heart failure) ([place])    a. pt refused cath. EF 15% 01/2017.  . CKD (chronic kidney disease), stage III ([place])   . COPD (chronic obstructive pulmonary disease) ([place])   . Hernia, hiatal   . Hyperlipidemia   . Hypertension   . Hyponatremia   . Mitral regurgitation    a. mod-severe by echo 01/2017.  . Pulmonary hypertension ([place])   . Schizophrenia ([place])   . Tobacco abuse    Past Surgical History:  Procedure Laterality Date  . PENILE PROSTHESIS IMPLANT       Current Outpatient Medications  Medication Sig Dispense Refill  . albuterol (PROVENTIL HFA;VENTOLIN  HFA) 108 (90 Base) MCG/ACT inhaler Inhale 1-2 puffs into the lungs every 6 (six) hours as needed for wheezing or shortness of breath.    Mar[place] Kitchen aspirin 81 MG EC tablet TAKE 1 TABLET BY MOUTH EVERY DAY 30 tablet 1  . atorvastatin (LIPITOR) 40 MG tablet TAKE 1 TABLET BY MOUTH EVERY DAY IN THE EVENING 30 tablet 6  . bisoprolol (ZEBETA) 5 MG tablet Take 0.5 tablets (2.5 mg total) by mouth daily. 45 tablet 3  . budesonide-formoterol (SYMBICORT) 160-4.5 MCG/ACT inhaler Inhale 2 puffs into the lungs 2 (two) times daily. 1 Inhaler 6  . furosemide (LASIX) 40 MG tablet Take 1 tablet (40 mg total) by mouth 2 (two) times daily. 60 tablet 6  . haloperidol decanoate (HALDOL DECANOATE) 100 MG/ML injection Inject 100 mg into the muscle every 30 (thirty) days.   99  . isosorbide-hydrALAZINE (BIDIL) 20-37.5 MG tablet TAKE 2 TABLETS BY MOUTH 3 (THREE) TIMES DAILY. 180 tablet 1  . KLOR-CON M20 20 MEQ tablet TAKE 1 TABLET BY MOUTH EVERY DAY 30 tablet 6  . losartan (COZAAR) 25 MG tablet Take 1 tablet (25 mg total) by mouth daily. 90 tablet 3  . nicotine (NICODERM CQ - DOSED IN MG/24 HOURS) 14 mg/24hr patch Place 1 patch (14 mg total) onto the skin daily. 28 patch 0  . spironolactone (ALDACTONE) 25 MG tablet TAKE 1 TABLET BY MOUTH EVERY DAY 30 tablet 11  . Tiotropium Bromide Monohydrate (SPIRIVA RESPIMAT) 2.5 MCG/ACT AERS Inhale 1 spray daily into the  lungs. 1 Inhaler 0   No current facility-administered medications for this visit.     Allergies:   Sulfa antibiotics   Social History:  The patient  reports that he has been smoking cigarettes.  He started smoking about 45 years ago. He has a 66.00 pack-year smoking history. he has never used smokeless tobacco. He reports that he does not drink alcohol or use drugs.   Family History:  The patient's  family history includes Hypertension in his father and mother.    ROS:  Please see the history of present illness.   All other systems are personally reviewed and negative.     PHYSICAL EXAM: VS:  BP 120/60   Pulse 91   Ht 5\' 10"  (1.778 m)   Wt 152 lb (68.9 kg)   BMI 21.81 kg/m  , BMI Body mass index is 21.81 kg/m. GEN: dissheveled, in no acute distress , fills the room with smells of tobacco HEENT: very poor dentition Neck: no JVD, carotid bruits, or masses Cardiac: RRR; 2/6 SEM at LSB, apex, laterally displaced PMI,no edema  Respiratory:  Diffuse expiratory phase, course breath sounds GI: soft, nontender, nondistended, + BS MS: diffuse muscle atrophy  Skin: warm and dry  Neuro:  Strength and sensation are intact Psych: euthymic mood, full affect  EKG:  EKG is ordered today. The ekg ordered today is personally reviewed and shows sinus rhythm 91 bpm, QRS 98 msec, LVH   Recent Labs: 04/18/2017: ALT 37; B Natriuretic Peptide 2,204.0; Hemoglobin 14.4; Platelets 234; TSH 3.458 04/21/2017: Magnesium 1.9 07/13/2017: BUN 38; Creatinine, Ser 2.16; Potassium 3.8; Sodium 136  personally reviewed   Lipid Panel     Component Value Date/Time   CHOL 207 (H) 04/23/2017 0437   TRIG 54 04/23/2017 0437   HDL 86 04/23/2017 0437   CHOLHDL 2.4 04/23/2017 0437   VLDL 11 04/23/2017 0437   LDLCALC 110 (H) 04/23/2017 0437   personally reviewed   Wt Readings from Last 3 Encounters:  09/14/17 152 lb (68.9 kg)  09/13/17 152 lb 11.2 oz (69.3 kg)  09/07/17 153 lb (69.4 kg)      Other studies personally reviewed: Additional studies/ records that were reviewed today include: CHF clinic notes, prior echo  Review of the above records today demonstrates: as above   ASSESSMENT AND PLAN:  1.  Chronic systolic dysfunction He has significant social, psych, and compliance issues which limit his medical management scheme.  He has had multiple prior hospitalizations during which he has left AMA.  Schizophrenia raises significant issues and is a contraindication to ICD implantation.  His QRS < 130 msec. He does not meet criteria for CRT. Given his ongoing tobacco in the  setting of severe COPD as well as advanced cardiomyopathy with NYHA Class III symptoms, his overall prognosis is quite poor. Unfortunately, he is not a candidate for ICD implantation for primary prevention of sudden death. We discussed this at length.  He is not interested in an ICD at this time.  Follow-up:  As needed  Current medicines are reviewed at length with the patient today.   The patient does not have concerns regarding his medicines.  The following changes were made today:  none  Signed, Thompson Grayer, MD  09/14/2017 2:39 PM     [address] [place] [address] [phone] (office) [phone] (fax)

## 2017-09-14 NOTE — Patient Instructions (Addendum)
Medication Instructions:  Your physician recommends that you continue on your current medications as directed. Please refer to the Current Medication list given to you today.  Labwork: None ordered  Testing/Procedures: None ordered  Follow-Up: No follow up is needed at this time with Dr. Rayann Heman.  He will see you on an as needed basis.   Thank you for choosing [facility]!!        r

## 2017-09-15 NOTE — Addendum Note (Signed)
Addended by: Marlis Edelson C on: 09/15/2017 04:54 PM   Modules accepted: Orders

## 2017-09-21 ENCOUNTER — Encounter (HOSPITAL_COMMUNITY): Payer: Self-pay

## 2017-09-21 ENCOUNTER — Other Ambulatory Visit (HOSPITAL_COMMUNITY): Payer: Self-pay

## 2017-09-21 NOTE — Progress Notes (Signed)
Paramedicine Encounter    Patient ID: Gerald Nelson, male    DOB: July 08, 1950, 67 y.o.   MRN: [id number]    Patient Care Team: [name], MD as PCP - General (Internal Medicine)  Patient Active Problem List   Diagnosis Date Noted  . Noncompliance 04/23/2017  . Mitral regurgitation 04/23/2017  . Thrombus - possible apical thrombus 01/2017 04/23/2017  . Hyperlipidemia 04/23/2017  . Pulmonary hypertension ([place])   . SOB (shortness of breath)   . Palliative care by specialist   . Tobacco abuse 04/19/2017  . Acute respiratory failure with hypoxia ([place]) 01/27/2017  . Hypertensive urgency 01/27/2017  . CHF (congestive heart failure) ([place]) 01/26/2017  . CKD (chronic kidney disease), stage III ([place]) 01/04/2017  . Essential hypertension 01/04/2017  . CVA (cerebral infarction) 12/12/2013  . Hyponatremia 12/06/2012  . Hiatal hernia 12/06/2012  . Syncope 12/06/2012  . Schizophrenia ([place])     Current Outpatient Medications:  .  aspirin 81 MG EC tablet, TAKE 1 TABLET BY MOUTH EVERY DAY, Disp: 30 tablet, Rfl: 1 .  atorvastatin (LIPITOR) 40 MG tablet, TAKE 1 TABLET BY MOUTH EVERY DAY IN THE EVENING, Disp: 30 tablet, Rfl: 6 .  bisoprolol (ZEBETA) 5 MG tablet, Take 0.5 tablets (2.5 mg total) by mouth daily., Disp: 45 tablet, Rfl: 3 .  furosemide (LASIX) 40 MG tablet, Take 1 tablet (40 mg total) by mouth 2 (two) times daily., Disp: 60 tablet, Rfl: 6 .  isosorbide-hydrALAZINE (BIDIL) 20-37.5 MG tablet, TAKE 2 TABLETS BY MOUTH 3 (THREE) TIMES DAILY., Disp: 180 tablet, Rfl: 1 .  KLOR-CON M20 20 MEQ tablet, TAKE 1 TABLET BY MOUTH EVERY DAY, Disp: 30 tablet, Rfl: 6 .  losartan (COZAAR) 25 MG tablet, Take 1 tablet (25 mg total) by mouth daily., Disp: 90 tablet, Rfl: 3 .  spironolactone (ALDACTONE) 25 MG tablet, TAKE 1 TABLET BY MOUTH EVERY DAY, Disp: 30 tablet, Rfl: 11 .  albuterol (PROVENTIL HFA;VENTOLIN HFA) 108 (90 Base) MCG/ACT inhaler, Inhale 1-2 puffs into the lungs every 6 (six) hours as needed  for wheezing or shortness of breath., Disp: , Rfl:  .  budesonide-formoterol (SYMBICORT) 160-4.5 MCG/ACT inhaler, Inhale 2 puffs into the lungs 2 (two) times daily., Disp: 1 Inhaler, Rfl: 6 .  haloperidol decanoate (HALDOL DECANOATE) 100 MG/ML injection, Inject 100 mg into the muscle every 30 (thirty) days. , Disp: , Rfl: 99 .  nicotine (NICODERM CQ - DOSED IN MG/24 HOURS) 14 mg/24hr patch, Place 1 patch (14 mg total) onto the skin daily., Disp: 28 patch, Rfl: 0 .  Tiotropium Bromide Monohydrate (SPIRIVA RESPIMAT) 2.5 MCG/ACT AERS, Inhale 1 spray daily into the lungs., Disp: 1 Inhaler, Rfl: 0 Allergies  Allergen Reactions  . Sulfa Antibiotics Nausea Only     Social History   Socioeconomic History  . Marital status: Single    Spouse name: Not on file  . Number of children: Not on file  . Years of education: Not on file  . Highest education level: Not on file  Social Needs  . Financial resource strain: Not on file  . Food insecurity - worry: Not on file  . Food insecurity - inability: Not on file  . Transportation needs - medical: Not on file  . Transportation needs - non-medical: Not on file  Occupational History  . Not on file  Tobacco Use  . Smoking status: Current Every Day Smoker    Packs/day: 1.50    Years: 44.00    Pack years: 66.00  Types: Cigarettes    Start date: 67  . Smokeless tobacco: Never Used  Substance and Sexual Activity  . Alcohol use: No  . Drug use: No  . Sexual activity: Not on file  Other Topics Concern  . Not on file  Social History Narrative  . Not on file    Physical Exam  Pulmonary/Chest: No respiratory distress.  Abdominal: He exhibits no distension. There is no tenderness.  Musculoskeletal: He exhibits no edema.  Skin: Skin is warm and dry. He is not diaphoretic.        Future Appointments  Date Time Provider [place]  09/22/2017  1:30 PM [facility] PA/NP [facility] None    ATF pt CAO x4 standing in his living room with no  complaints.  Pt stated that he saw the physician about the defib/pacemaker and the physician has advised against getting one.  He was told that his kidneys are too weak.  Pt has taken all of his meds without missing any within this past week.  Pt hasn't taken his morning meds today but he took them during our visit. He continues to use his crockpot and he remains active.  Pt's vitals noted.  He denies sob, chest pain and dizziness.  rx bottles verified and pill box refilled.   Pt has an appointment tomorrow at the heart failure clinic tomorrow.  If there's no med changes, I  Will set pt up with [facility] family pharmacy for pill packs. Depending on the outcome tomorrow, will determine how much longer I will follow pt.   BP (!) 150/98 (BP Location: Left Arm, Patient Position: Standing, Cuff Size: Normal)   Pulse 88   Resp 16   Wt 151 lb 9.6 oz (68.8 kg)   SpO2 97%   BMI 21.75 kg/m   Weight yesterday-150 Last visit weight-152    Janeisha Ryle, EMT Paramedic 09/21/2017    ACTION: Home visit completed Next visit planned for 3/21 and next week

## 2017-09-22 ENCOUNTER — Ambulatory Visit (HOSPITAL_COMMUNITY)
Admission: RE | Admit: 2017-09-22 | Discharge: 2017-09-22 | Disposition: A | Discharge status: Home / Self Care | Payer: Medicare Other | Source: Ambulatory Visit | Attending: Cardiology | Admitting: Cardiology

## 2017-09-22 ENCOUNTER — Encounter (HOSPITAL_COMMUNITY): Payer: Self-pay

## 2017-09-22 ENCOUNTER — Other Ambulatory Visit (HOSPITAL_COMMUNITY): Payer: Self-pay

## 2017-09-22 VITALS — BP 135/82 | HR 78 | Wt 152.0 lb

## 2017-09-22 DIAGNOSIS — I083 Combined rheumatic disorders of mitral, aortic and tricuspid valves: Secondary | ICD-10-CM | POA: Diagnosis not present

## 2017-09-22 DIAGNOSIS — I130 Hypertensive heart and chronic kidney disease with heart failure and stage 1 through stage 4 chronic kidney disease, or unspecified chronic kidney disease: Secondary | ICD-10-CM | POA: Diagnosis not present

## 2017-09-22 DIAGNOSIS — F17210 Nicotine dependence, cigarettes, uncomplicated: Secondary | ICD-10-CM | POA: Diagnosis not present

## 2017-09-22 DIAGNOSIS — Z7982 Long term (current) use of aspirin: Secondary | ICD-10-CM | POA: Insufficient documentation

## 2017-09-22 DIAGNOSIS — Z8673 Personal history of transient ischemic attack (TIA), and cerebral infarction without residual deficits: Secondary | ICD-10-CM | POA: Diagnosis not present

## 2017-09-22 DIAGNOSIS — Z720 Tobacco use: Secondary | ICD-10-CM | POA: Diagnosis not present

## 2017-09-22 DIAGNOSIS — I340 Nonrheumatic mitral (valve) insufficiency: Secondary | ICD-10-CM

## 2017-09-22 DIAGNOSIS — N183 Chronic kidney disease, stage 3 unspecified: Secondary | ICD-10-CM

## 2017-09-22 DIAGNOSIS — Z79899 Other long term (current) drug therapy: Secondary | ICD-10-CM | POA: Diagnosis not present

## 2017-09-22 DIAGNOSIS — F209 Schizophrenia, unspecified: Secondary | ICD-10-CM

## 2017-09-22 DIAGNOSIS — J449 Chronic obstructive pulmonary disease, unspecified: Secondary | ICD-10-CM | POA: Insufficient documentation

## 2017-09-22 DIAGNOSIS — I272 Pulmonary hypertension, unspecified: Secondary | ICD-10-CM | POA: Insufficient documentation

## 2017-09-22 DIAGNOSIS — I5022 Chronic systolic (congestive) heart failure: Secondary | ICD-10-CM | POA: Insufficient documentation

## 2017-09-22 DIAGNOSIS — E785 Hyperlipidemia, unspecified: Secondary | ICD-10-CM | POA: Insufficient documentation

## 2017-09-22 DIAGNOSIS — I10 Essential (primary) hypertension: Secondary | ICD-10-CM

## 2017-09-22 LAB — BASIC METABOLIC PANEL
Anion gap: 12 (ref 5–15)
BUN: 25 mg/dL — AB (ref 6–20)
CALCIUM: 9.3 mg/dL (ref 8.9–10.3)
CHLORIDE: 100 mmol/L — AB (ref 101–111)
CO2: 23 mmol/L (ref 22–32)
CREATININE: 1.64 mg/dL — AB (ref 0.61–1.24)
GFR calc Af Amer: 49 mL/min — ABNORMAL LOW (ref >60)
GFR calc non Af Amer: 43 mL/min — ABNORMAL LOW (ref >60)
Glucose, Bld: 97 mg/dL (ref 65–99)
Potassium: 4.2 mmol/L (ref 3.5–5.1)
SODIUM: 135 mmol/L (ref 135–145)

## 2017-09-22 MED ORDER — BISOPROLOL FUMARATE 5 MG PO TABS: 5.0000 mg | ORAL_TABLET | Freq: Every day | ORAL | 3 refills | Status: DC

## 2017-09-22 NOTE — Patient Instructions (Signed)
INCREASE Bisoprolol to 5 mg (1 whole tablet) once daily.  Routine lab work today. Will notify you of abnormal results, otherwise no news is good news!  Follow up 2 months with Oda Kilts PA-C.  Take all medication as prescribed the day of your appointment. Bring all medications with you to your appointment.  Do the following things EVERYDAY: 1) Weigh yourself in the morning before breakfast. Write it down and keep it in a log. 2) Take your medicines as prescribed 3) Eat low salt foods-Limit salt (sodium) to 2000 mg per day.  4) Stay as active as you can everyday 5) Limit all fluids for the day to less than 2 liters

## 2017-09-22 NOTE — Addendum Note (Signed)
Encounter addended by: Louann Liv, LCSW on: 09/22/2017 3:50 PM  Actions taken: Sign clinical note

## 2017-09-22 NOTE — Progress Notes (Signed)
Advanced Heart Failure Clinic Note    Primary Care: Dr. Deforest Hoyles   Primary Cardiologist: Dr. Harrington Challenger, Dr. Aundra Dubin   HPI: Gerald Nelson is a 67 y.o. male with a past medical history of chronic systolic CHF (EF 16% in July 2018), severe MR/TR, pulmonary HTN, CVA, CKD, schizophrenia, HTN and tobacco abuse.   [name] was admitted to [facility] 7/2-01/06/17 for newly diagnosed acute systolic CHF. Echo showed LVEF 15%, severe global hypokinesis, moderate LVH, coarsetrabeculation of the LV apex with numerous false tendinae of the left ventricle, very stagnant blood flow at the LV apex withsmoke but no obvious LV thrombus - Definity contrast was given,again, noting stagnant apical blood flow - this could suggestrecent thrombus and certainly high risk for apical thrombusformation. Other findings include aortic sclerosis with mild AI,moderate to severe MR, moderate LAE, mild RAE, moderate to severeTR, moderate to severe pulmonary hypertension (RVSP 73 mmHg), dilated IVC, trivial posterior pericardial effusion - tamponade physiology could be excluded given the degree of pulmonaryhypertension.He was seen by Dr. Harrington Challenger during this admission who felt that he needed a L/RHC, but patient left AMA. When he left, he did not pick up any medications due to lack of money and only continued to take ASA. He lives in a boarding house with two room mates and lives off of social security. He does have medicare. The only medication he takes regularly is haldol given to him by his psychiatrist.He has left the hospital several times before and has limited therapeutic options because of social and psych issues and compliance. He left on 10/3 and has stayed at a boarding house since then.  He left AMA 04/07/17. He was seen back in the office by Dr. Percival Spanish on 04/18/2017. Reported continued DOE, cough, and leg edema. He reported he had been taking meds but this could not be verified. It was not clear that he was compliant with sodium  restriction (eating pasta and ground beef). Prior discharge weights were variable, reportedly 159 in our office the day of his visit with Dr. Percival Spanish (with measured weight of 156 on 10/16). Due to concern for significant volume overload he was admitted for further evaluation.   He diuresed 25 pounds on IV lasix. He refused cath. His clonidine and diltazem were stopped during this admission. Referred to paramedicine. Discharge weight 133 pounds.   He presents today for regular follow up. At last visit switched from coreg to bisoprol due to cardioselectivity and pulmonary disease. He is doing well overall. Denies SOB on flat ground or with ADLs. He continues to smoke 1.5 ppd. He goes to the [employer] most days to socialize. He sits around with his friends smoking and talking. He denies orthopnea or PNd. No lightheadedness or dizziness. No fevers, chills, or productive cough. Taking all medications as directed. Paramedicine sees and fills his pill box.   - Echo 08/11/17 LVEF 15-20%, no MR noted.   Review of systems complete and found to be negative unless listed in HPI.    Past Medical History:  Diagnosis Date  . Apical mural thrombus    a. question of apical thrombus on echo 01/2017, pt left AMA, not felt to be anticoag candidate with noncompliance.  . Arthritis   . CHF (congestive heart failure) ([place])   . Chronic systolic CHF (congestive heart failure) ([place])    a. pt refused cath. EF 15% 01/2017.  . CKD (chronic kidney disease), stage III ([place])   . COPD (chronic obstructive pulmonary disease) ([place])   .  Hernia, hiatal   . Hyperlipidemia   . Hypertension   . Hyponatremia   . Mitral regurgitation    a. mod-severe by echo 01/2017.  . Pulmonary hypertension ([place])   . Schizophrenia ([place])   . Tobacco abuse     Current Outpatient Medications  Medication Sig Dispense Refill  . albuterol (PROVENTIL HFA;VENTOLIN HFA) 108 (90 Base) MCG/ACT inhaler Inhale 1-2 puffs into the lungs every 6 (six) hours  as needed for wheezing or shortness of breath.    Marland Kitchen aspirin 81 MG EC tablet TAKE 1 TABLET BY MOUTH EVERY DAY 30 tablet 1  . atorvastatin (LIPITOR) 40 MG tablet TAKE 1 TABLET BY MOUTH EVERY DAY IN THE EVENING 30 tablet 6  . bisoprolol (ZEBETA) 5 MG tablet Take 0.5 tablets (2.5 mg total) by mouth daily. 45 tablet 3  . budesonide-formoterol (SYMBICORT) 160-4.5 MCG/ACT inhaler Inhale 2 puffs into the lungs 2 (two) times daily. 1 Inhaler 6  . furosemide (LASIX) 40 MG tablet Take 1 tablet (40 mg total) by mouth 2 (two) times daily. 60 tablet 6  . haloperidol decanoate (HALDOL DECANOATE) 100 MG/ML injection Inject 100 mg into the muscle every 30 (thirty) days.   99  . isosorbide-hydrALAZINE (BIDIL) 20-37.5 MG tablet TAKE 2 TABLETS BY MOUTH 3 (THREE) TIMES DAILY. 180 tablet 1  . KLOR-CON M20 20 MEQ tablet TAKE 1 TABLET BY MOUTH EVERY DAY 30 tablet 6  . losartan (COZAAR) 25 MG tablet Take 1 tablet (25 mg total) by mouth daily. 90 tablet 3  . nicotine (NICODERM CQ - DOSED IN MG/24 HOURS) 14 mg/24hr patch Place 1 patch (14 mg total) onto the skin daily. 28 patch 0  . spironolactone (ALDACTONE) 25 MG tablet TAKE 1 TABLET BY MOUTH EVERY DAY 30 tablet 11  . Tiotropium Bromide Monohydrate (SPIRIVA RESPIMAT) 2.5 MCG/ACT AERS Inhale 1 spray daily into the lungs. 1 Inhaler 0   No current facility-administered medications for this encounter.    Allergies  Allergen Reactions  . Sulfa Antibiotics Nausea Only   Social History   Socioeconomic History  . Marital status: Single    Spouse name: Not on file  . Number of children: Not on file  . Years of education: Not on file  . Highest education level: Not on file  Occupational History  . Not on file  Social Needs  . Financial resource strain: Not on file  . Food insecurity:    Worry: Not on file    Inability: Not on file  . Transportation needs:    Medical: Not on file    Non-medical: Not on file  Tobacco Use  . Smoking status: Current Every Day Smoker     Packs/day: 1.50    Years: 44.00    Pack years: 66.00    Types: Cigarettes    Start date: 49  . Smokeless tobacco: Never Used  Substance and Sexual Activity  . Alcohol use: No  . Drug use: No  . Sexual activity: Not on file  Lifestyle  . Physical activity:    Days per week: Not on file    Minutes per session: Not on file  . Stress: Not on file  Relationships  . Social connections:    Talks on phone: Not on file    Gets together: Not on file    Attends religious service: Not on file    Active member of club or organization: Not on file    Attends meetings of clubs or organizations: Not on file  Relationship status: Not on file  . Intimate partner violence:    Fear of current or ex partner: Not on file    Emotionally abused: Not on file    Physically abused: Not on file    Forced sexual activity: Not on file  Other Topics Concern  . Not on file  Social History Narrative  . Not on file      Family History  Problem Relation Age of Onset  . Hypertension Mother   . Hypertension Father    Vitals:   09/22/17 1322  BP: 135/82  Pulse: 78  SpO2: 98%  Weight: 152 lb (68.9 kg)   Wt Readings from Last 3 Encounters:  09/22/17 152 lb (68.9 kg)  09/21/17 151 lb 9.6 oz (68.8 kg)  09/14/17 152 lb (68.9 kg)   PHYSICAL EXAM: General: Appears older than stated age.  No resp difficulty. HEENT: Normal Neck: Supple. JVP 5-6. Carotids 2+ bilat; no bruits. No thyromegaly or nodule noted. Cor: PMI nondisplaced. RRR, 2/6 SEM.  noted Lungs: Diminished throughout. No wheeze.  Abdomen: Soft, non-tender, non-distended, no HSM. No bruits or masses. +BS  Extremities: No cyanosis, clubbing, or rash. R and LLE no edema.  Neuro: Alert & orientedx3, cranial nerves grossly intact. moves all 4 extremities w/o difficulty. Affect pleasant   ASSESSMENT & PLAN: 1. Chronic systolic CHF: Echo 07/6999 EF 15%.  - Echo 08/11/17 LVEF 15-20%, no MR noted.  - NYHA II symptoms.  - Volume status  stable on exam.  - Continue lasix 40 mg BID. Can take extra 40 mg as needed.  - Increase bisoprolol to 5 mg daily.  - Continue losartan 25 mg daily. Not candidate for [facility] with CKD. BMET today.  - Continue spiro 25 mg daily.  - Met with EP and pt refused ICD. Per EP he is poor candidate with co-morbidities.  - Reinforced fluid restriction to < 2 L daily, sodium restriction to less than 2000 mg daily, and the importance of daily weights.    2. Questionable apical thrombus:  - Poor anticoagulation candidate. No change.   3. MR - Mod/Sev on last echo. Suspect functional.  - Not noted at all on Echo 08/11/17.   4. Tobacco abuse - Continues to smoke > 1 ppd.  - He cannot afford nicotine patches. Aside from this, he refuses cessation at this time.   5. CKD III:  - BMET today.   6. SCHIZOPHRENIA:  - Follows with PCP.   7. HTN - Stable on current regimen.   8. COPD - Encouraged complete smoking cessation.  - No s/os of AECOPD at this time.   Meds and labs as above. RTC 2 months, sooner with symptoms.   Shirley Friar, PA-C 09/22/17   Greater than 50% of the 25 minute visit was spent in counseling/coordination of care regarding disease state education, salt/fluid restriction, sliding scale diuretics, and medication compliance.

## 2017-09-22 NOTE — Progress Notes (Signed)
Paramedicine Encounter   Patient ID: Gerald Nelson , male,   DOB: 1950-12-21,66 y.o.,  MRN: [id number]   Met patient in clinic today with provider [name].   [name] talked to pt about him stop smoking and limiting the amount of cigarettes he smokes throughout the day.  Pt has maintained his weight within 2lbs since his last visit to the CHF clinic.  Pt has no complaints today.  He took his meds prior to this visit and brought his meds.  Pt denies sob, dizziness and chest pain.  Pt saw Kennyth Lose about food pantries in the area.    **increased bisoprolol to '5mg'$  (1 whole pill). Time spent with patient 30 mins  Khamiyah Grefe, EMT-Paramedic 09/22/2017   ACTION: Home visit completed

## 2017-09-22 NOTE — Progress Notes (Signed)
CSW referred to assist patient with food banks. Patient reports he has been trying to prepare meals with fresh fruits and vegetables. Patient provided information on farm fresh markets for free produce as well as local food banks. Patient grateful for the assistance and will follow up. CSW available as needed. Raquel Sarna, [place], [place]

## 2017-09-28 ENCOUNTER — Other Ambulatory Visit (HOSPITAL_COMMUNITY): Payer: Self-pay

## 2017-09-29 ENCOUNTER — Encounter (HOSPITAL_COMMUNITY): Payer: Self-pay

## 2017-09-29 NOTE — Progress Notes (Signed)
Paramedicine Encounter    Patient ID: Gerald Nelson, male    DOB: 04-25-1951, 67 y.o.   MRN: [id number]    Patient Care Team: [name], MD as PCP - General (Internal Medicine)  Patient Active Problem List   Diagnosis Date Noted  . Noncompliance 04/23/2017  . Mitral regurgitation 04/23/2017  . Thrombus - possible apical thrombus 01/2017 04/23/2017  . Hyperlipidemia 04/23/2017  . Pulmonary hypertension ([place])   . SOB (shortness of breath)   . Palliative care by specialist   . Tobacco abuse 04/19/2017  . Acute respiratory failure with hypoxia ([place]) 01/27/2017  . Hypertensive urgency 01/27/2017  . CHF (congestive heart failure) ([place]) 01/26/2017  . CKD (chronic kidney disease), stage III ([place]) 01/04/2017  . Essential hypertension 01/04/2017  . CVA (cerebral infarction) 12/12/2013  . Hyponatremia 12/06/2012  . Hiatal hernia 12/06/2012  . Syncope 12/06/2012  . Schizophrenia ([place])     Current Outpatient Medications:  .  aspirin 81 MG EC tablet, TAKE 1 TABLET BY MOUTH EVERY DAY, Disp: 30 tablet, Rfl: 1 .  atorvastatin (LIPITOR) 40 MG tablet, TAKE 1 TABLET BY MOUTH EVERY DAY IN THE EVENING, Disp: 30 tablet, Rfl: 6 .  bisoprolol (ZEBETA) 5 MG tablet, Take 1 tablet (5 mg total) by mouth daily., Disp: 90 tablet, Rfl: 3 .  furosemide (LASIX) 40 MG tablet, Take 1 tablet (40 mg total) by mouth 2 (two) times daily., Disp: 60 tablet, Rfl: 6 .  haloperidol decanoate (HALDOL DECANOATE) 100 MG/ML injection, Inject 100 mg into the muscle every 30 (thirty) days. , Disp: , Rfl: 99 .  isosorbide-hydrALAZINE (BIDIL) 20-37.5 MG tablet, TAKE 2 TABLETS BY MOUTH 3 (THREE) TIMES DAILY., Disp: 180 tablet, Rfl: 1 .  spironolactone (ALDACTONE) 25 MG tablet, TAKE 1 TABLET BY MOUTH EVERY DAY, Disp: 30 tablet, Rfl: 11 .  albuterol (PROVENTIL HFA;VENTOLIN HFA) 108 (90 Base) MCG/ACT inhaler, Inhale 1-2 puffs into the lungs every 6 (six) hours as needed for wheezing or shortness of breath., Disp: , Rfl:  .   budesonide-formoterol (SYMBICORT) 160-4.5 MCG/ACT inhaler, Inhale 2 puffs into the lungs 2 (two) times daily., Disp: 1 Inhaler, Rfl: 6 .  KLOR-CON M20 20 MEQ tablet, TAKE 1 TABLET BY MOUTH EVERY DAY, Disp: 30 tablet, Rfl: 6 .  losartan (COZAAR) 25 MG tablet, Take 1 tablet (25 mg total) by mouth daily., Disp: 90 tablet, Rfl: 3 .  nicotine (NICODERM CQ - DOSED IN MG/24 HOURS) 14 mg/24hr patch, Place 1 patch (14 mg total) onto the skin daily., Disp: 28 patch, Rfl: 0 .  Tiotropium Bromide Monohydrate (SPIRIVA RESPIMAT) 2.5 MCG/ACT AERS, Inhale 1 spray daily into the lungs., Disp: 1 Inhaler, Rfl: 0 Allergies  Allergen Reactions  . Sulfa Antibiotics Nausea Only     Social History   Socioeconomic History  . Marital status: Single    Spouse name: Not on file  . Number of children: Not on file  . Years of education: Not on file  . Highest education level: Not on file  Occupational History  . Not on file  Social Needs  . Financial resource strain: Not on file  . Food insecurity:    Worry: Not on file    Inability: Not on file  . Transportation needs:    Medical: Not on file    Non-medical: Not on file  Tobacco Use  . Smoking status: Current Every Day Smoker    Packs/day: 1.50    Years: 44.00    Pack years: 66.00  Types: Cigarettes    Start date: 52  . Smokeless tobacco: Never Used  Substance and Sexual Activity  . Alcohol use: No  . Drug use: No  . Sexual activity: Not on file  Lifestyle  . Physical activity:    Days per week: Not on file    Minutes per session: Not on file  . Stress: Not on file  Relationships  . Social connections:    Talks on phone: Not on file    Gets together: Not on file    Attends religious service: Not on file    Active member of club or organization: Not on file    Attends meetings of clubs or organizations: Not on file    Relationship status: Not on file  . Intimate partner violence:    Fear of current or ex partner: Not on file     Emotionally abused: Not on file    Physically abused: Not on file    Forced sexual activity: Not on file  Other Topics Concern  . Not on file  Social History Narrative  . Not on file    Physical Exam  Pulmonary/Chest: No respiratory distress.  Abdominal: He exhibits no distension.  Musculoskeletal: He exhibits no edema.  Skin: Skin is warm and dry. He is not diaphoretic.        Future Appointments  Date Time Provider [place]  12/01/2017  1:30 PM [facility] PA/NP [facility] None    ATF pt CAO x4 standing in the livingroom smoking a cigarette.  [place]. He denies chest pain, sob and dizziness.  Pt has done a great job with his diet and taking his medications.  Pt will benefit from a pill pack and staying in compliance with taking them.  rx bottles verified and pill box refilled.   BP 112/88 (BP Location: Left Arm, Patient Position: Standing, Cuff Size: Normal)   Pulse 76   Resp 16   Wt 152 lb 6.4 oz (69.1 kg)   SpO2 98%   BMI 21.87 kg/m   Weight yesterday-152 Last visit weight-151    Delorice Bannister, EMT Paramedic 09/30/2017    ACTION: Home visit completed

## 2017-09-30 ENCOUNTER — Other Ambulatory Visit (HOSPITAL_COMMUNITY): Payer: Self-pay | Admitting: Cardiology

## 2017-10-06 ENCOUNTER — Encounter (HOSPITAL_COMMUNITY): Payer: Self-pay

## 2017-10-06 ENCOUNTER — Other Ambulatory Visit (HOSPITAL_COMMUNITY): Payer: Self-pay

## 2017-10-06 NOTE — Progress Notes (Signed)
Paramedicine Encounter    Patient ID: Gerald Nelson, male    DOB: 02-16-1951, 67 y.o.   MRN: [id number]    Patient Care Team: [name], MD as PCP - General (Internal Medicine)  Patient Active Problem List   Diagnosis Date Noted  . Noncompliance 04/23/2017  . Mitral regurgitation 04/23/2017  . Thrombus - possible apical thrombus 01/2017 04/23/2017  . Hyperlipidemia 04/23/2017  . Pulmonary hypertension ([place])   . SOB (shortness of breath)   . Palliative care by specialist   . Tobacco abuse 04/19/2017  . Acute respiratory failure with hypoxia ([place]) 01/27/2017  . Hypertensive urgency 01/27/2017  . CHF (congestive heart failure) ([place]) 01/26/2017  . CKD (chronic kidney disease), stage III ([place]) 01/04/2017  . Essential hypertension 01/04/2017  . CVA (cerebral infarction) 12/12/2013  . Hyponatremia 12/06/2012  . Hiatal hernia 12/06/2012  . Syncope 12/06/2012  . Schizophrenia ([place])     Current Outpatient Medications:  .  atorvastatin (LIPITOR) 40 MG tablet, TAKE 1 TABLET BY MOUTH EVERY DAY IN THE EVENING, Disp: 30 tablet, Rfl: 6 .  bisoprolol (ZEBETA) 5 MG tablet, Take 1 tablet (5 mg total) by mouth daily., Disp: 90 tablet, Rfl: 3 .  furosemide (LASIX) 40 MG tablet, Take 1 tablet (40 mg total) by mouth 2 (two) times daily., Disp: 60 tablet, Rfl: 6 .  isosorbide-hydrALAZINE (BIDIL) 20-37.5 MG tablet, TAKE 2 TABLETS BY MOUTH 3 (THREE) TIMES DAILY., Disp: 180 tablet, Rfl: 1 .  KLOR-CON M20 20 MEQ tablet, TAKE 1 TABLET BY MOUTH EVERY DAY, Disp: 30 tablet, Rfl: 6 .  losartan (COZAAR) 25 MG tablet, Take 1 tablet (25 mg total) by mouth daily., Disp: 90 tablet, Rfl: 3 .  spironolactone (ALDACTONE) 25 MG tablet, TAKE 1 TABLET BY MOUTH EVERY DAY, Disp: 30 tablet, Rfl: 11 .  albuterol (PROVENTIL HFA;VENTOLIN HFA) 108 (90 Base) MCG/ACT inhaler, Inhale 1-2 puffs into the lungs every 6 (six) hours as needed for wheezing or shortness of breath., Disp: , Rfl:  .  aspirin 81 MG EC tablet, TAKE 1  TABLET BY MOUTH EVERY DAY, Disp: 30 tablet, Rfl: 1 .  budesonide-formoterol (SYMBICORT) 160-4.5 MCG/ACT inhaler, Inhale 2 puffs into the lungs 2 (two) times daily., Disp: 1 Inhaler, Rfl: 6 .  haloperidol decanoate (HALDOL DECANOATE) 100 MG/ML injection, Inject 100 mg into the muscle every 30 (thirty) days. , Disp: , Rfl: 99 .  nicotine (NICODERM CQ - DOSED IN MG/24 HOURS) 14 mg/24hr patch, Place 1 patch (14 mg total) onto the skin daily., Disp: 28 patch, Rfl: 0 .  Tiotropium Bromide Monohydrate (SPIRIVA RESPIMAT) 2.5 MCG/ACT AERS, Inhale 1 spray daily into the lungs., Disp: 1 Inhaler, Rfl: 0 Allergies  Allergen Reactions  . Sulfa Antibiotics Nausea Only     Social History   Socioeconomic History  . Marital status: Single    Spouse name: Not on file  . Number of children: Not on file  . Years of education: Not on file  . Highest education level: Not on file  Occupational History  . Not on file  Social Needs  . Financial resource strain: Not on file  . Food insecurity:    Worry: Not on file    Inability: Not on file  . Transportation needs:    Medical: Not on file    Non-medical: Not on file  Tobacco Use  . Smoking status: Current Every Day Smoker    Packs/day: 1.50    Years: 44.00    Pack years: 66.00  Types: Cigarettes    Start date: 51  . Smokeless tobacco: Never Used  Substance and Sexual Activity  . Alcohol use: No  . Drug use: No  . Sexual activity: Not on file  Lifestyle  . Physical activity:    Days per week: Not on file    Minutes per session: Not on file  . Stress: Not on file  Relationships  . Social connections:    Talks on phone: Not on file    Gets together: Not on file    Attends religious service: Not on file    Active member of club or organization: Not on file    Attends meetings of clubs or organizations: Not on file    Relationship status: Not on file  . Intimate partner violence:    Fear of current or ex partner: Not on file     Emotionally abused: Not on file    Physically abused: Not on file    Forced sexual activity: Not on file  Other Topics Concern  . Not on file  Social History Narrative  . Not on file    Physical Exam  Pulmonary/Chest: No respiratory distress. He has no wheezes.  Abdominal: He exhibits no distension. There is no tenderness.  Musculoskeletal: He exhibits no edema.  Skin: Skin is warm and dry. He is not diaphoretic.        Future Appointments  Date Time Provider [place]  12/01/2017  1:30 PM [facility] PA/NP [facility] None    ATF pt CAO x4 standing in the living room w/no complaints.  Pt would like to try the pill packs but due to [place] still working on the pill packs.  I came to pt's house today to fill his pill box until Friday night because the packs arent ready yet. Pt had an appointment today with his psychiatrist who gave him the 30 day haldol shot.  Pt stated that he feels good today and denies sob, dizziness and chest pain. rx bottles verified and pill box refilled for 2 days.  I plan to f/u with him tomorrow with his pills packs.    BP 132/88 (BP Location: Left Arm, Patient Position: Standing, Cuff Size: Normal)   Pulse 78   Resp 16   Wt 154 lb 12.8 oz (70.2 kg)   SpO2 98%   BMI 22.21 kg/m   Weight yesterday-153.6 Last visit weight-152    Kowen Kluth, EMT Paramedic 10/06/2017    ACTION: Home visit completed

## 2017-10-07 ENCOUNTER — Other Ambulatory Visit (HOSPITAL_COMMUNITY): Payer: Self-pay

## 2017-10-07 ENCOUNTER — Encounter (HOSPITAL_COMMUNITY): Payer: Self-pay

## 2017-10-07 NOTE — Progress Notes (Signed)
Paramedicine Encounter    Patient ID: Gerald Nelson, male    DOB: Jun 13, 1951, 67 y.o.   MRN: [id number]    Patient Care Team: [name], MD as PCP - General (Internal Medicine)  Patient Active Problem List   Diagnosis Date Noted  . Noncompliance 04/23/2017  . Mitral regurgitation 04/23/2017  . Thrombus - possible apical thrombus 01/2017 04/23/2017  . Hyperlipidemia 04/23/2017  . Pulmonary hypertension ([place])   . SOB (shortness of breath)   . Palliative care by specialist   . Tobacco abuse 04/19/2017  . Acute respiratory failure with hypoxia ([place]) 01/27/2017  . Hypertensive urgency 01/27/2017  . CHF (congestive heart failure) ([place]) 01/26/2017  . CKD (chronic kidney disease), stage III ([place]) 01/04/2017  . Essential hypertension 01/04/2017  . CVA (cerebral infarction) 12/12/2013  . Hyponatremia 12/06/2012  . Hiatal hernia 12/06/2012  . Syncope 12/06/2012  . Schizophrenia ([place])     Current Outpatient Medications:  .  albuterol (PROVENTIL HFA;VENTOLIN HFA) 108 (90 Base) MCG/ACT inhaler, Inhale 1-2 puffs into the lungs every 6 (six) hours as needed for wheezing or shortness of breath., Disp: , Rfl:  .  aspirin 81 MG EC tablet, TAKE 1 TABLET BY MOUTH EVERY DAY, Disp: 30 tablet, Rfl: 1 .  atorvastatin (LIPITOR) 40 MG tablet, TAKE 1 TABLET BY MOUTH EVERY DAY IN THE EVENING, Disp: 30 tablet, Rfl: 6 .  bisoprolol (ZEBETA) 5 MG tablet, Take 1 tablet (5 mg total) by mouth daily., Disp: 90 tablet, Rfl: 3 .  budesonide-formoterol (SYMBICORT) 160-4.5 MCG/ACT inhaler, Inhale 2 puffs into the lungs 2 (two) times daily., Disp: 1 Inhaler, Rfl: 6 .  furosemide (LASIX) 40 MG tablet, Take 1 tablet (40 mg total) by mouth 2 (two) times daily., Disp: 60 tablet, Rfl: 6 .  haloperidol decanoate (HALDOL DECANOATE) 100 MG/ML injection, Inject 100 mg into the muscle every 30 (thirty) days. , Disp: , Rfl: 99 .  isosorbide-hydrALAZINE (BIDIL) 20-37.5 MG tablet, TAKE 2 TABLETS BY MOUTH 3 (THREE) TIMES DAILY.,  Disp: 180 tablet, Rfl: 1 .  KLOR-CON M20 20 MEQ tablet, TAKE 1 TABLET BY MOUTH EVERY DAY, Disp: 30 tablet, Rfl: 6 .  losartan (COZAAR) 25 MG tablet, Take 1 tablet (25 mg total) by mouth daily., Disp: 90 tablet, Rfl: 3 .  nicotine (NICODERM CQ - DOSED IN MG/24 HOURS) 14 mg/24hr patch, Place 1 patch (14 mg total) onto the skin daily., Disp: 28 patch, Rfl: 0 .  spironolactone (ALDACTONE) 25 MG tablet, TAKE 1 TABLET BY MOUTH EVERY DAY, Disp: 30 tablet, Rfl: 11 .  Tiotropium Bromide Monohydrate (SPIRIVA RESPIMAT) 2.5 MCG/ACT AERS, Inhale 1 spray daily into the lungs., Disp: 1 Inhaler, Rfl: 0 Allergies  Allergen Reactions  . Sulfa Antibiotics Nausea Only     Social History   Socioeconomic History  . Marital status: Single    Spouse name: Not on file  . Number of children: Not on file  . Years of education: Not on file  . Highest education level: Not on file  Occupational History  . Not on file  Social Needs  . Financial resource strain: Not on file  . Food insecurity:    Worry: Not on file    Inability: Not on file  . Transportation needs:    Medical: Not on file    Non-medical: Not on file  Tobacco Use  . Smoking status: Current Every Day Smoker    Packs/day: 1.50    Years: 44.00    Pack years: 66.00  Types: Cigarettes    Start date: 12  . Smokeless tobacco: Never Used  Substance and Sexual Activity  . Alcohol use: No  . Drug use: No  . Sexual activity: Not on file  Lifestyle  . Physical activity:    Days per week: Not on file    Minutes per session: Not on file  . Stress: Not on file  Relationships  . Social connections:    Talks on phone: Not on file    Gets together: Not on file    Attends religious service: Not on file    Active member of club or organization: Not on file    Attends meetings of clubs or organizations: Not on file    Relationship status: Not on file  . Intimate partner violence:    Fear of current or ex partner: Not on file    Emotionally  abused: Not on file    Physically abused: Not on file    Forced sexual activity: Not on file  Other Topics Concern  . Not on file  Social History Narrative  . Not on file    Physical Exam  Pulmonary/Chest: Effort normal. He has wheezes.  Expiratory wheezing all fields  Abdominal: He exhibits no distension. There is no tenderness.  Musculoskeletal: He exhibits no edema.  Skin: Skin is warm and dry. He is not diaphoretic.        Future Appointments  Date Time Provider [place]  12/01/2017  1:30 PM [facility] PA/NP [facility] None    ATF pt CAO x4 standing in the livingroom, he just finished smoking a cigarette.  Today is a re-visit to drop off his medication bubble packs from [place].  I assured that the pill packs were correct prior to giving them to him and the bubble packs were explained to him.  Pt stated that he hasn't taken any medications yet including the albuterol inhaler.  Pt was advised to take his meds including the inhaler which he agreed.  He denies difficulty breathing although he has expiratory wheezing sounds in all four lung fields.  [place].  If pt does good with using the pill packs, pt will be able to discharge from the [facility] program.   BP (!) 148/90 (BP Location: Left Arm, Patient Position: Standing, Cuff Size: Normal)   Pulse 100   Resp 20   SpO2 98%   Weight yesterday-154 Last visit weight-154    Kasper Mudrick, EMT Paramedic 10/07/2017    ACTION: Home visit completed

## 2017-10-07 NOTE — Progress Notes (Signed)
Re-Visit I picked up pt's medications from [place] due to him not having transportation.  I also wanted to confirm that the pill packs were complete and pt understands how to use the pill bubble packs.  ATF pt standing in the living room with no complaints.  He stated that he has not taken any of his meds as of yet including using his albuterol inhaler.  He stated that he would take his meds after I leave and he eats breakfast.  The bubble pill pack was explained to him and he states that he understands how to take his meds.  Pt's vitals noted; wheezing in all lung fields.  He was advised to use his inhaler and call if he has any worsening of wheezing or sob.

## 2017-10-14 ENCOUNTER — Other Ambulatory Visit (HOSPITAL_COMMUNITY): Payer: Self-pay

## 2017-10-14 ENCOUNTER — Encounter (HOSPITAL_COMMUNITY): Payer: Self-pay

## 2017-10-14 NOTE — Progress Notes (Signed)
Paramedicine Encounter    Patient ID: Gerald Nelson, male    DOB: [birth year], 67 y.o.   MRN: [id number]    Patient Care Team: [name], MD as PCP - General (Internal Medicine)  Patient Active Problem List   Diagnosis Date Noted  . Noncompliance 04/23/2017  . Mitral regurgitation 04/23/2017  . Thrombus - possible apical thrombus 01/2017 04/23/2017  . Hyperlipidemia 04/23/2017  . Pulmonary hypertension ([place])   . SOB (shortness of breath)   . Palliative care by specialist   . Tobacco abuse 04/19/2017  . Acute respiratory failure with hypoxia ([place]) 01/27/2017  . Hypertensive urgency 01/27/2017  . CHF (congestive heart failure) ([place]) 01/26/2017  . CKD (chronic kidney disease), stage III ([place]) 01/04/2017  . Essential hypertension 01/04/2017  . CVA (cerebral infarction) 12/12/2013  . Hyponatremia 12/06/2012  . Hiatal hernia 12/06/2012  . Syncope 12/06/2012  . Schizophrenia ([place])     Current Outpatient Medications:  .  albuterol (PROVENTIL HFA;VENTOLIN HFA) 108 (90 Base) MCG/ACT inhaler, Inhale 1-2 puffs into the lungs every 6 (six) hours as needed for wheezing or shortness of breath., Disp: , Rfl:  .  aspirin 81 MG EC tablet, TAKE 1 TABLET BY MOUTH EVERY DAY, Disp: 30 tablet, Rfl: 1 .  atorvastatin (LIPITOR) 40 MG tablet, TAKE 1 TABLET BY MOUTH EVERY DAY IN THE EVENING, Disp: 30 tablet, Rfl: 6 .  bisoprolol (ZEBETA) 5 MG tablet, Take 1 tablet (5 mg total) by mouth daily., Disp: 90 tablet, Rfl: 3 .  budesonide-formoterol (SYMBICORT) 160-4.5 MCG/ACT inhaler, Inhale 2 puffs into the lungs 2 (two) times daily., Disp: 1 Inhaler, Rfl: 6 .  furosemide (LASIX) 40 MG tablet, Take 1 tablet (40 mg total) by mouth 2 (two) times daily., Disp: 60 tablet, Rfl: 6 .  haloperidol decanoate (HALDOL DECANOATE) 100 MG/ML injection, Inject 100 mg into the muscle every 30 (thirty) days. , Disp: , Rfl: 99 .  isosorbide-hydrALAZINE (BIDIL) 20-37.5 MG tablet, TAKE 2 TABLETS BY MOUTH 3 (THREE) TIMES DAILY.,  Disp: 180 tablet, Rfl: 1 .  KLOR-CON M20 20 MEQ tablet, TAKE 1 TABLET BY MOUTH EVERY DAY, Disp: 30 tablet, Rfl: 6 .  losartan (COZAAR) 25 MG tablet, Take 1 tablet (25 mg total) by mouth daily., Disp: 90 tablet, Rfl: 3 .  nicotine (NICODERM CQ - DOSED IN MG/24 HOURS) 14 mg/24hr patch, Place 1 patch (14 mg total) onto the skin daily., Disp: 28 patch, Rfl: 0 .  spironolactone (ALDACTONE) 25 MG tablet, TAKE 1 TABLET BY MOUTH EVERY DAY, Disp: 30 tablet, Rfl: 11 .  Tiotropium Bromide Monohydrate (SPIRIVA RESPIMAT) 2.5 MCG/ACT AERS, Inhale 1 spray daily into the lungs., Disp: 1 Inhaler, Rfl: 0 Allergies  Allergen Reactions  . Sulfa Antibiotics Nausea Only     Social History   Socioeconomic History  . Marital status: Single    Spouse name: Not on file  . Number of children: Not on file  . Years of education: Not on file  . Highest education level: Not on file  Occupational History  . Not on file  Social Needs  . Financial resource strain: Not on file  . Food insecurity:    Worry: Not on file    Inability: Not on file  . Transportation needs:    Medical: Not on file    Non-medical: Not on file  Tobacco Use  . Smoking status: Current Every Day Smoker    Packs/day: 1.50    Years: 44.00    Pack years: 66.00  Types: Cigarettes    Start date: 62  . Smokeless tobacco: Never Used  Substance and Sexual Activity  . Alcohol use: No  . Drug use: No  . Sexual activity: Not on file  Lifestyle  . Physical activity:    Days per week: Not on file    Minutes per session: Not on file  . Stress: Not on file  Relationships  . Social connections:    Talks on phone: Not on file    Gets together: Not on file    Attends religious service: Not on file    Active member of club or organization: Not on file    Attends meetings of clubs or organizations: Not on file    Relationship status: Not on file  . Intimate partner violence:    Fear of current or ex partner: Not on file    Emotionally  abused: Not on file    Physically abused: Not on file    Forced sexual activity: Not on file  Other Topics Concern  . Not on file  Social History Narrative  . Not on file    Physical Exam  Pulmonary/Chest: He has wheezes.  Both top and lower lung fields/expiratory wheezing  Abdominal: He exhibits no distension.  Musculoskeletal: He exhibits no edema.  Skin: Skin is warm and dry. He is not diaphoretic.        Future Appointments  Date Time Provider [place]  12/01/2017  1:30 PM [facility] PA/NP [facility] None    ATF pt CAO x4 standing in the living room smoking a cigarette. Pt received pill bubble packs for this month, which he took all of his meds besides one afternoon dose. He stated that he just took his morning meds a few mins prior to our visit.  Pt denies sob, dizziness and chest pain.  Pt has expiratory wheezing on both lower and upper lung fields.  He stated that he had just used his inhaler also.  Pt has a prescription for nicotin patch but he cant afford the prescription or the over the counter prices.  Pt's vitals noted.  rx verified.   Pt stated that he has an appointment next week for Vocational rehabilitation to help find employment.  Pt stated that his bills is taking up all of his money so he needs a job.  Our next visit is in two weeks due to pt having stable weights, medication access and he understands how to take his medications.   BP (!) 140/100 (BP Location: Left Arm, Patient Position: Standing)   Pulse 92   Resp 16   Wt 152 lb (68.9 kg)   SpO2 94%   BMI 21.81 kg/m   Weight yesterday-didn't weigh Last visit weight-154    Anastashia Westerfeld, EMT Paramedic 10/14/2017    ACTION: Home visit completed Next call planned for two weeks

## 2017-10-28 ENCOUNTER — Other Ambulatory Visit (HOSPITAL_COMMUNITY): Payer: Self-pay

## 2017-10-28 NOTE — Progress Notes (Signed)
Paramedicine Encounter    Patient ID: Gerald Nelson, male    DOB: 11-11-1950, 67 y.o.   MRN: [id number]    Patient Care Team: [name], MD as PCP - General (Internal Medicine)  Patient Active Problem List   Diagnosis Date Noted  . Noncompliance 04/23/2017  . Mitral regurgitation 04/23/2017  . Thrombus - possible apical thrombus 01/2017 04/23/2017  . Hyperlipidemia 04/23/2017  . Pulmonary hypertension ([place])   . SOB (shortness of breath)   . Palliative care by specialist   . Tobacco abuse 04/19/2017  . Acute respiratory failure with hypoxia ([place]) 01/27/2017  . Hypertensive urgency 01/27/2017  . CHF (congestive heart failure) ([place]) 01/26/2017  . CKD (chronic kidney disease), stage III ([place]) 01/04/2017  . Essential hypertension 01/04/2017  . CVA (cerebral infarction) 12/12/2013  . Hyponatremia 12/06/2012  . Hiatal hernia 12/06/2012  . Syncope 12/06/2012  . Schizophrenia ([place])     Current Outpatient Medications:  .  albuterol (PROVENTIL HFA;VENTOLIN HFA) 108 (90 Base) MCG/ACT inhaler, Inhale 1-2 puffs into the lungs every 6 (six) hours as needed for wheezing or shortness of breath., Disp: , Rfl:  .  aspirin 81 MG EC tablet, TAKE 1 TABLET BY MOUTH EVERY DAY, Disp: 30 tablet, Rfl: 1 .  atorvastatin (LIPITOR) 40 MG tablet, TAKE 1 TABLET BY MOUTH EVERY DAY IN THE EVENING, Disp: 30 tablet, Rfl: 6 .  bisoprolol (ZEBETA) 5 MG tablet, Take 1 tablet (5 mg total) by mouth daily., Disp: 90 tablet, Rfl: 3 .  budesonide-formoterol (SYMBICORT) 160-4.5 MCG/ACT inhaler, Inhale 2 puffs into the lungs 2 (two) times daily., Disp: 1 Inhaler, Rfl: 6 .  furosemide (LASIX) 40 MG tablet, Take 1 tablet (40 mg total) by mouth 2 (two) times daily., Disp: 60 tablet, Rfl: 6 .  haloperidol decanoate (HALDOL DECANOATE) 100 MG/ML injection, Inject 100 mg into the muscle every 30 (thirty) days. , Disp: , Rfl: 99 .  isosorbide-hydrALAZINE (BIDIL) 20-37.5 MG tablet, TAKE 2 TABLETS BY MOUTH 3 (THREE) TIMES DAILY.,  Disp: 180 tablet, Rfl: 1 .  KLOR-CON M20 20 MEQ tablet, TAKE 1 TABLET BY MOUTH EVERY DAY, Disp: 30 tablet, Rfl: 6 .  losartan (COZAAR) 25 MG tablet, Take 1 tablet (25 mg total) by mouth daily., Disp: 90 tablet, Rfl: 3 .  nicotine (NICODERM CQ - DOSED IN MG/24 HOURS) 14 mg/24hr patch, Place 1 patch (14 mg total) onto the skin daily., Disp: 28 patch, Rfl: 0 .  spironolactone (ALDACTONE) 25 MG tablet, TAKE 1 TABLET BY MOUTH EVERY DAY, Disp: 30 tablet, Rfl: 11 .  Tiotropium Bromide Monohydrate (SPIRIVA RESPIMAT) 2.5 MCG/ACT AERS, Inhale 1 spray daily into the lungs., Disp: 1 Inhaler, Rfl: 0 Allergies  Allergen Reactions  . Sulfa Antibiotics Nausea Only     Social History   Socioeconomic History  . Marital status: Single    Spouse name: Not on file  . Number of children: Not on file  . Years of education: Not on file  . Highest education level: Not on file  Occupational History  . Not on file  Social Needs  . Financial resource strain: Not on file  . Food insecurity:    Worry: Not on file    Inability: Not on file  . Transportation needs:    Medical: Not on file    Non-medical: Not on file  Tobacco Use  . Smoking status: Current Every Day Smoker    Packs/day: 1.50    Years: 44.00    Pack years: 66.00  Types: Cigarettes    Start date: 25  . Smokeless tobacco: Never Used  Substance and Sexual Activity  . Alcohol use: No  . Drug use: No  . Sexual activity: Not on file  Lifestyle  . Physical activity:    Days per week: Not on file    Minutes per session: Not on file  . Stress: Not on file  Relationships  . Social connections:    Talks on phone: Not on file    Gets together: Not on file    Attends religious service: Not on file    Active member of club or organization: Not on file    Attends meetings of clubs or organizations: Not on file    Relationship status: Not on file  . Intimate partner violence:    Fear of current or ex partner: Not on file    Emotionally  abused: Not on file    Physically abused: Not on file    Forced sexual activity: Not on file  Other Topics Concern  . Not on file  Social History Narrative  . Not on file    Physical Exam  Constitutional: He appears well-developed.  Pulmonary/Chest: Effort normal. No respiratory distress.  Abdominal: He exhibits distension. There is no tenderness. There is no guarding.  Musculoskeletal: He exhibits no edema.  Skin: Skin is warm and dry. He is not diaphoretic.        Future Appointments  Date Time Provider [place]  12/01/2017  1:30 PM [facility] PA/NP [facility] None  12/06/2017 11:00 AM Brand Males, MD [facility] None    ATF pt CAO x4 sitting on the porch smoking a cigarette.  Pt has taken most of his meds within these past two weeks; missing one Tuesday afternoon and evening; and one other afternoon.  Pt stated that when he's not home he misses his meds sometimes. I advised him to put his meds in a sandwich bag or foil so that he will have his meds.  Pt denies sob, chest pain and dizziness.  He stated that he has not had a good appetite lately, but denies any stomach issues.  Pt stated that he has been released to work from his physician, therefore he is going to vocational rehabilitation Monday to look for a job.  Pt's weight has been steady and his vitals also.  During our next visit, if pt has been able to keep up with using the pill packs and steady wt's and vitals. 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   BP 108/78 (BP Location: Left Arm, Patient Position: Sitting, Cuff Size: Normal)   Pulse 81   Resp 20   Wt 145 lb (65.8 kg)   SpO2 98%   BMI 20.81 kg/m   Weight yesterday-145 Last visit weight-152    Jermani Pund, EMT Paramedic 10/28/2017    ACTION: Home visit completed

## 2017-11-04 ENCOUNTER — Other Ambulatory Visit (HOSPITAL_COMMUNITY): Payer: Self-pay | Admitting: *Deleted

## 2017-11-04 MED ORDER — ISOSORB DINITRATE-HYDRALAZINE 20-37.5 MG PO TABS: ORAL_TABLET | ORAL | 1 refills | Status: DC

## 2017-11-08 ENCOUNTER — Telehealth (HOSPITAL_COMMUNITY): Payer: Self-pay

## 2017-11-08 NOTE — Telephone Encounter (Signed)
Pt called to schedule a visit for this week.  I advised pt that I will need to check his bubble pack.  We agreed to a have a [facility] visit Thursday around 9.

## 2017-11-10 ENCOUNTER — Other Ambulatory Visit (HOSPITAL_COMMUNITY): Payer: Self-pay

## 2017-11-15 NOTE — Progress Notes (Signed)
Paramedicine Encounter    Patient ID: Gerald Nelson, male    DOB: [birth year], 67 y.o.   MRN: [id number]    Patient Care Team: [name], MD as PCP - General (Internal Medicine)  Patient Active Problem List   Diagnosis Date Noted  . Noncompliance 04/23/2017  . Mitral regurgitation 04/23/2017  . Thrombus - possible apical thrombus 01/2017 04/23/2017  . Hyperlipidemia 04/23/2017  . Pulmonary hypertension ([place])   . SOB (shortness of breath)   . Palliative care by specialist   . Tobacco abuse 04/19/2017  . Acute respiratory failure with hypoxia ([place]) 01/27/2017  . Hypertensive urgency 01/27/2017  . CHF (congestive heart failure) ([place]) 01/26/2017  . CKD (chronic kidney disease), stage III ([place]) 01/04/2017  . Essential hypertension 01/04/2017  . CVA (cerebral infarction) 12/12/2013  . Hyponatremia 12/06/2012  . Hiatal hernia 12/06/2012  . Syncope 12/06/2012  . Schizophrenia ([place])     Current Outpatient Medications:  .  albuterol (PROVENTIL HFA;VENTOLIN HFA) 108 (90 Base) MCG/ACT inhaler, Inhale 1-2 puffs into the lungs every 6 (six) hours as needed for wheezing or shortness of breath., Disp: , Rfl:  .  aspirin 81 MG EC tablet, TAKE 1 TABLET BY MOUTH EVERY DAY, Disp: 30 tablet, Rfl: 1 .  atorvastatin (LIPITOR) 40 MG tablet, TAKE 1 TABLET BY MOUTH EVERY DAY IN THE EVENING, Disp: 30 tablet, Rfl: 6 .  bisoprolol (ZEBETA) 5 MG tablet, Take 1 tablet (5 mg total) by mouth daily., Disp: 90 tablet, Rfl: 3 .  budesonide-formoterol (SYMBICORT) 160-4.5 MCG/ACT inhaler, Inhale 2 puffs into the lungs 2 (two) times daily., Disp: 1 Inhaler, Rfl: 6 .  furosemide (LASIX) 40 MG tablet, Take 1 tablet (40 mg total) by mouth 2 (two) times daily., Disp: 60 tablet, Rfl: 6 .  haloperidol decanoate (HALDOL DECANOATE) 100 MG/ML injection, Inject 100 mg into the muscle every 30 (thirty) days. , Disp: , Rfl: 99 .  isosorbide-hydrALAZINE (BIDIL) 20-37.5 MG tablet, TAKE 2 TABLETS BY MOUTH 3 (THREE) TIMES DAILY.,  Disp: 180 tablet, Rfl: 1 .  KLOR-CON M20 20 MEQ tablet, TAKE 1 TABLET BY MOUTH EVERY DAY, Disp: 30 tablet, Rfl: 6 .  losartan (COZAAR) 25 MG tablet, Take 1 tablet (25 mg total) by mouth daily., Disp: 90 tablet, Rfl: 3 .  nicotine (NICODERM CQ - DOSED IN MG/24 HOURS) 14 mg/24hr patch, Place 1 patch (14 mg total) onto the skin daily., Disp: 28 patch, Rfl: 0 .  spironolactone (ALDACTONE) 25 MG tablet, TAKE 1 TABLET BY MOUTH EVERY DAY, Disp: 30 tablet, Rfl: 11 .  Tiotropium Bromide Monohydrate (SPIRIVA RESPIMAT) 2.5 MCG/ACT AERS, Inhale 1 spray daily into the lungs., Disp: 1 Inhaler, Rfl: 0 Allergies  Allergen Reactions  . Sulfa Antibiotics Nausea Only     Social History   Socioeconomic History  . Marital status: Single    Spouse name: Not on file  . Number of children: Not on file  . Years of education: Not on file  . Highest education level: Not on file  Occupational History  . Not on file  Social Needs  . Financial resource strain: Not on file  . Food insecurity:    Worry: Not on file    Inability: Not on file  . Transportation needs:    Medical: Not on file    Non-medical: Not on file  Tobacco Use  . Smoking status: Current Every Day Smoker    Packs/day: 1.50    Years: 44.00    Pack years: 66.00  Types: Cigarettes    Start date: 71  . Smokeless tobacco: Never Used  Substance and Sexual Activity  . Alcohol use: No  . Drug use: No  . Sexual activity: Not on file  Lifestyle  . Physical activity:    Days per week: Not on file    Minutes per session: Not on file  . Stress: Not on file  Relationships  . Social connections:    Talks on phone: Not on file    Gets together: Not on file    Attends religious service: Not on file    Active member of club or organization: Not on file    Attends meetings of clubs or organizations: Not on file    Relationship status: Not on file  . Intimate partner violence:    Fear of current or ex partner: Not on file    Emotionally  abused: Not on file    Physically abused: Not on file    Forced sexual activity: Not on file  Other Topics Concern  . Not on file  Social History Narrative  . Not on file    Physical Exam  Constitutional: He is oriented to person, place, and time.  Pulmonary/Chest: Effort normal. No respiratory distress.  Musculoskeletal: He exhibits no edema.  Neurological: He is alert and oriented to person, place, and time.  Skin: He is not diaphoretic.        Future Appointments  Date Time Provider [place]  12/01/2017  1:30 PM [facility] PA/NP [facility] None  12/06/2017 11:00 AM Brand Males, MD [facility] None    ATF pt CAO x4 sitting in the living room. Today I checked his pill packs that were delivered from [facility]; carvedilol was found in pt's pill pack along with the rest of his meds.  I advised pt that he is no longer on this med.  I took the pill out of the pack that he is currently using and took the other two with me for the pharmacy to correct.  Pt denies dizziness although his bp is noted to be lower than normal.  The pill packs that I left will get him to next week.  Pt denies dizziness, chest pain and sob.  BP (!) 94/50 (BP Location: Left Arm, Patient Position: Sitting, Cuff Size: Normal)   Pulse 86   Resp 16   Wt 146 lb 6.4 oz (66.4 kg)   SpO2 97%   BMI 21.01 kg/m   Weight yesterday-146 Last visit weight-145    Ronson Hagins, EMT Paramedic 11/15/2017    ACTION: Home visit completed

## 2017-11-17 ENCOUNTER — Other Ambulatory Visit (HOSPITAL_COMMUNITY): Payer: Self-pay

## 2017-11-18 NOTE — Progress Notes (Signed)
Pt's medication bubble pack was incorrect when I checked last week, therefore I took them back to the pharmacy for revision.  I dropped the revised bubble packs to pt at his home.

## 2017-12-01 ENCOUNTER — Inpatient Hospital Stay (HOSPITAL_COMMUNITY): Admission: RE | Admit: 2017-12-01 | Payer: Self-pay | Source: Ambulatory Visit

## 2017-12-02 ENCOUNTER — Other Ambulatory Visit (HOSPITAL_COMMUNITY): Payer: Self-pay

## 2017-12-02 ENCOUNTER — Other Ambulatory Visit (HOSPITAL_COMMUNITY): Payer: Self-pay | Admitting: Cardiology

## 2017-12-04 NOTE — Progress Notes (Signed)
Paramedicine Encounter    Patient ID: Gerald Nelson, male    DOB: 1951/05/17, 67 y.o.   MRN: [id number]    Patient Care Team: [name], MD as PCP - General (Internal Medicine)  Patient Active Problem List   Diagnosis Date Noted  . Noncompliance 04/23/2017  . Mitral regurgitation 04/23/2017  . Thrombus - possible apical thrombus 01/2017 04/23/2017  . Hyperlipidemia 04/23/2017  . Pulmonary hypertension ([place])   . SOB (shortness of breath)   . Palliative care by specialist   . Tobacco abuse 04/19/2017  . Acute respiratory failure with hypoxia ([place]) 01/27/2017  . Hypertensive urgency 01/27/2017  . CHF (congestive heart failure) ([place]) 01/26/2017  . CKD (chronic kidney disease), stage III ([place]) 01/04/2017  . Essential hypertension 01/04/2017  . CVA (cerebral infarction) 12/12/2013  . Hyponatremia 12/06/2012  . Hiatal hernia 12/06/2012  . Syncope 12/06/2012  . Schizophrenia ([place])     Current Outpatient Medications:  .  albuterol (PROVENTIL HFA;VENTOLIN HFA) 108 (90 Base) MCG/ACT inhaler, Inhale 1-2 puffs into the lungs every 6 (six) hours as needed for wheezing or shortness of breath., Disp: , Rfl:  .  aspirin 81 MG EC tablet, TAKE 1 TABLET BY MOUTH EVERY DAY, Disp: 30 tablet, Rfl: 1 .  atorvastatin (LIPITOR) 40 MG tablet, TAKE 1 TABLET BY MOUTH EVERY DAY IN THE EVENING, Disp: 30 tablet, Rfl: 6 .  bisoprolol (ZEBETA) 5 MG tablet, Take 1 tablet (5 mg total) by mouth daily., Disp: 90 tablet, Rfl: 3 .  budesonide-formoterol (SYMBICORT) 160-4.5 MCG/ACT inhaler, Inhale 2 puffs into the lungs 2 (two) times daily., Disp: 1 Inhaler, Rfl: 6 .  furosemide (LASIX) 40 MG tablet, Take 1 tablet (40 mg total) by mouth 2 (two) times daily., Disp: 60 tablet, Rfl: 6 .  haloperidol decanoate (HALDOL DECANOATE) 100 MG/ML injection, Inject 100 mg into the muscle every 30 (thirty) days. , Disp: , Rfl: 99 .  isosorbide-hydrALAZINE (BIDIL) 20-37.5 MG tablet, TAKE 2 TABLETS BY MOUTH 3 (THREE) TIMES DAILY.,  Disp: 180 tablet, Rfl: 6 .  KLOR-CON M20 20 MEQ tablet, TAKE 1 TABLET BY MOUTH EVERY DAY, Disp: 30 tablet, Rfl: 6 .  losartan (COZAAR) 25 MG tablet, Take 1 tablet (25 mg total) by mouth daily., Disp: 90 tablet, Rfl: 3 .  nicotine (NICODERM CQ - DOSED IN MG/24 HOURS) 14 mg/24hr patch, Place 1 patch (14 mg total) onto the skin daily., Disp: 28 patch, Rfl: 0 .  spironolactone (ALDACTONE) 25 MG tablet, TAKE 1 TABLET BY MOUTH EVERY DAY, Disp: 30 tablet, Rfl: 11 .  Tiotropium Bromide Monohydrate (SPIRIVA RESPIMAT) 2.5 MCG/ACT AERS, Inhale 1 spray daily into the lungs., Disp: 1 Inhaler, Rfl: 0 Allergies  Allergen Reactions  . Sulfa Antibiotics Nausea Only     Social History   Socioeconomic History  . Marital status: Single    Spouse name: Not on file  . Number of children: Not on file  . Years of education: Not on file  . Highest education level: Not on file  Occupational History  . Not on file  Social Needs  . Financial resource strain: Not on file  . Food insecurity:    Worry: Not on file    Inability: Not on file  . Transportation needs:    Medical: Not on file    Non-medical: Not on file  Tobacco Use  . Smoking status: Current Every Day Smoker    Packs/day: 1.50    Years: 44.00    Pack years: 66.00  Types: Cigarettes    Start date: 10  . Smokeless tobacco: Never Used  Substance and Sexual Activity  . Alcohol use: No  . Drug use: No  . Sexual activity: Not on file  Lifestyle  . Physical activity:    Days per week: Not on file    Minutes per session: Not on file  . Stress: Not on file  Relationships  . Social connections:    Talks on phone: Not on file    Gets together: Not on file    Attends religious service: Not on file    Active member of club or organization: Not on file    Attends meetings of clubs or organizations: Not on file    Relationship status: Not on file  . Intimate partner violence:    Fear of current or ex partner: Not on file    Emotionally  abused: Not on file    Physically abused: Not on file    Forced sexual activity: Not on file  Other Topics Concern  . Not on file  Social History Narrative  . Not on file    Physical Exam      Future Appointments  Date Time Provider [place]  12/06/2017 11:00 AM Brand Males, MD [facility] None  12/14/2017 10:00 AM [facility] PA/NP [facility] None    ATF pt CAO x4 sitting on the porch smoking a cigarette.  Today's visit is to check the pill bubble packs that was delivered from [place].  The pill packs were verified.  Pt has no sob, no chest pain and no dizziness.  After next month, pt will no longer need CHP due to him having a pcp, has no issues with obtaining his medications and understands the red zone.    BP (!) 146/88 (BP Location: Left Arm, Patient Position: Standing, Cuff Size: Normal)   Pulse 97   Resp 16   Wt 143 lb (64.9 kg)   SpO2 97%   BMI 20.52 kg/m    Reynoldo Mainer, EMT Paramedic 12/04/2017    ACTION: Home visit completed

## 2017-12-06 ENCOUNTER — Ambulatory Visit (INDEPENDENT_AMBULATORY_CARE_PROVIDER_SITE_OTHER): Payer: Medicare Other | Admitting: Internal Medicine

## 2017-12-06 ENCOUNTER — Encounter: Payer: Self-pay | Admitting: Internal Medicine

## 2017-12-06 VITALS — BP 120/74 | HR 82 | Ht 70.0 in | Wt 139.4 lb

## 2017-12-06 DIAGNOSIS — F17210 Nicotine dependence, cigarettes, uncomplicated: Secondary | ICD-10-CM

## 2017-12-06 DIAGNOSIS — I2510 Atherosclerotic heart disease of native coronary artery without angina pectoris: Secondary | ICD-10-CM | POA: Diagnosis not present

## 2017-12-06 DIAGNOSIS — J449 Chronic obstructive pulmonary disease, unspecified: Secondary | ICD-10-CM | POA: Diagnosis not present

## 2017-12-06 MED ORDER — TIOTROPIUM BROMIDE MONOHYDRATE 2.5 MCG/ACT IN AERS: 1.0000 | INHALATION_SPRAY | Freq: Every day | RESPIRATORY_TRACT | 5 refills | Status: DC

## 2017-12-06 NOTE — Patient Instructions (Addendum)
ICD-10-CM   1. Stage 3 severe COPD by GOLD classification ([place]) J44.9   2. Cigarette smoker F17.210   3. Coronary artery calcification seen on CAT scan I25.10     Stage 3 severe COPD by GOLD classification ([place]) - stable with mild level of symtpoms with just spiriva daily and albuterol as needed - appears you are not on symbicort - continue spiriva daily - use albuterol as needed - will hold off symbicort  Cigarette smoker - try to quit' -if you cannot by next visit then will discuss drug therapy next vsiit  Coronary artery calcification seen on CAT scan - refer cardiology  followup - 6 months or sooner if needed

## 2017-12-06 NOTE — Progress Notes (Signed)
Subjective:     Patient ID: Gerald Nelson, male   DOB: Aug 30, 1950, 67 y.o.   MRN: [id number]  HPI   PCP [name], MD  HPI  IOV 05/17/2017  Chief Complaint  Patient presents with  . Advice Only    Referred by Dr. Deforest Hoyles due to COPD.  Pt has complaints of SOB, cough with white mucus. Denies any CP.    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  He has been referred by Dr. Deforest Hoyles for question of COPD according to patient complaint.  Patient himself states that he has only been short of breath for the last 1 year.  It is insidious onset.  Associated with cough and wheezing.  In July 2018 did get diagnosed with systolic heart failure and he says "a lot of fluid was pulled out" and he lost weight.  After this his symptoms have improved significantly although he still does have some residual shortness of breath cough and wheezing.  The shortness of breath is mild present only with exertion.  In fact when we walked him in the office today he did not desaturate not complain of shortness of breath.  She did have some wheezing on exam.  He continues to smoke 2 packs a day.  He started smoking  in 1974  20 years  Personal visualization of imaging: October 2018 chest x-ray shows cardiomegaly and some pulmonary congestion from his heart failure.  2014 and 2015 CT abdomen lung cuts in the base does not show any evidence of ILD.  Walking desaturation test on 05/17/2017 185 feet x 3 laps:  did NOT desaturate. Rest pulse ox was 100%, final pulse ox was 100%. HR response 84/min at rest to 93/min at peak exertion.  Echocardiogram January 04, 2017: Ejection fraction 15%   OV 06/22/2017  Chief Complaint  Patient presents with  . Follow-up    follow up for PFT   Gerald Nelson 11-Jul-1950 67 y.o. male [address] [place] [address] presents for follow-up for symptoms of smoking history associated with cough and wheezing.  I put him on Spiriva  for empiric diagnosis of COPD.  He tells me that with this cough wheezing and shortness of breath is significantly improved.  He did have pulmonary function test today documented below it shows severe Gold stage III COPD with significant bronchodilator response.  At this point in time he feels well.  His COPD CAT score today on Spiriva as documented below.  Immunization review shows that he has had his flu shot but will benefit from pneumonia vaccine   OV 12/06/2017  Chief Complaint  Patient presents with  . Follow-up    Pt states he has been doing good since last visit and denies any complaints or concerns.    [name] has stage 3 copd with alpha 1 MM - AMARION PORTELL , 67 y.o. , with dob [birth year] and male ,Not Hispanic or Latino from [place] [address] - presents to pulm clinic for followup opf  Stage 3 severe COPD by GOLD classification ([place]) : -COPD stable.  COPD CAT score is 10 reflecting very minimal symptoms.  He tells me that he is only taking Spiriva daily.  He does not take Symbicort.  He takes albuterol as needed.  He does not feel that he needs another inhaler.  Overall he feels improved from last visit because of Spiriva  Cigarette smoker: Continues to smoke.  In fact sitting in the room with a huge ashtray.  He says it is very difficult for him to quit.  We discussed quitting smoking.  He says he will quit on his own.  Next visit he is willing to talk about medication therapy.  Coronary artery calcification seen on CAT scan - -incidental finding on CT scan January 2019.  Realized only today.  He does not have any chest pain.  He is willing to see cardiology.  He does not believe he has a cardiologist although he has a past history of heart failure according to his history.      CAT COPD Symptom & Quality of Life Score (GSK trademark) 0 is no burden. 5 is highest burden 12/06/2017   Never Cough -> Cough all the time 3  No phlegm in chest -> Chest is full of  phlegm 2  No chest tightness -> Chest feels very tight   No dyspnea for 1 flight stairs/hill -> Very dyspneic for 1 flight of stairs   No limitations for ADL at home -> Very limited with ADL at home   Confident leaving home -> Not at all confident leaving home   Sleep soundly -> Do not sleep soundly because of lung condition   Lots of Energy -> No energy at all   TOTAL Score (max 40)        Results for DREVION, OFFORD (MRN [id number]) as of 06/22/2017 11:49  Ref. Range 06/22/2017 09:40  FEV1-Post Latest Units: L 1.12/41%  FEV1-%Change-Post Latest Units: % 27  Post FEV1/FVC ratio Latest Units: % 51  dlco ```  12.22/43%      has a past medical history of Apical mural thrombus, Arthritis, CHF (congestive heart failure) (HCC), Chronic systolic CHF (congestive heart failure) ([place]), CKD (chronic kidney disease), stage III (HCC), COPD (chronic obstructive pulmonary disease) (HCC), Hernia, hiatal, Hyperlipidemia, Hypertension, Hyponatremia, Mitral regurgitation, Pulmonary hypertension ([place]), Schizophrenia ([place]), and Tobacco abuse.   reports that he has been smoking cigarettes.  He started smoking about 45 years ago. He has a 66.00 pack-year smoking history. He has never used smokeless tobacco.  Past Surgical History:  Procedure Laterality Date  . PENILE PROSTHESIS IMPLANT      Allergies  Allergen Reactions  . Sulfa Antibiotics Nausea Only    Immunization History  Administered Date(s) Administered  . Influenza, High Dose Seasonal PF 04/16/2017  . Pneumococcal Conjugate-13 06/22/2017    Family History  Problem Relation Age of Onset  . Hypertension Mother   . Hypertension Father      Current Outpatient Medications:  .  albuterol (PROVENTIL HFA;VENTOLIN HFA) 108 (90 Base) MCG/ACT inhaler, Inhale 1-2 puffs into the lungs every 6 (six) hours as needed for wheezing or shortness of breath., Disp: , Rfl:  .  aspirin 81 MG EC tablet, TAKE 1 TABLET BY MOUTH EVERY DAY, Disp: 30 tablet,  Rfl: 1 .  atorvastatin (LIPITOR) 40 MG tablet, TAKE 1 TABLET BY MOUTH EVERY DAY IN THE EVENING, Disp: 30 tablet, Rfl: 6 .  bisoprolol (ZEBETA) 5 MG tablet, Take 1 tablet (5 mg total) by mouth daily., Disp: 90 tablet, Rfl: 3 .  budesonide-formoterol (SYMBICORT) 160-4.5 MCG/ACT inhaler, Inhale 2 puffs into the lungs 2 (two) times daily., Disp: 1 Inhaler, Rfl: 6 .  furosemide (LASIX) 40 MG tablet, Take 1 tablet (40 mg total) by mouth 2 (two) times daily., Disp: 60 tablet, Rfl: 6 .  haloperidol decanoate (HALDOL DECANOATE) 100 MG/ML injection, Inject 100 mg into the muscle  every 30 (thirty) days. , Disp: , Rfl: 99 .  isosorbide-hydrALAZINE (BIDIL) 20-37.5 MG tablet, TAKE 2 TABLETS BY MOUTH 3 (THREE) TIMES DAILY., Disp: 180 tablet, Rfl: 6 .  KLOR-CON M20 20 MEQ tablet, TAKE 1 TABLET BY MOUTH EVERY DAY, Disp: 30 tablet, Rfl: 6 .  losartan (COZAAR) 25 MG tablet, TAKE ONE TABLET BY MOUTH ONCE DAILY (AM), Disp: , Rfl: 9 .  nicotine (NICODERM CQ - DOSED IN MG/24 HOURS) 14 mg/24hr patch, Place 1 patch (14 mg total) onto the skin daily., Disp: 28 patch, Rfl: 0 .  spironolactone (ALDACTONE) 25 MG tablet, TAKE 1 TABLET BY MOUTH EVERY DAY, Disp: 30 tablet, Rfl: 11 .  losartan (COZAAR) 25 MG tablet, Take 1 tablet (25 mg total) by mouth daily., Disp: 90 tablet, Rfl: 3 .  Tiotropium Bromide Monohydrate (SPIRIVA RESPIMAT) 2.5 MCG/ACT AERS, Inhale 1 spray daily into the lungs. (Patient not taking: Reported on 12/06/2017), Disp: 1 Inhaler, Rfl: 0   Review of Systems     Objective:   Physical Exam  Constitutional: He is oriented to person, place, and time. He appears well-developed and well-nourished. No distress.  Disheveled male  HENT:  Head: Normocephalic and atraumatic.  Right Ear: External ear normal.  Left Ear: External ear normal.  Mouth/Throat: Oropharynx is clear and moist. No oropharyngeal exudate.  Smells of tobacco Poor dentition  Eyes: Pupils are equal, round, and reactive to light. Conjunctivae  and EOM are normal. Right eye exhibits no discharge. Left eye exhibits no discharge. No scleral icterus.  Neck: Normal range of motion. Neck supple. No JVD present. No tracheal deviation present. No thyromegaly present.  Cardiovascular: Normal rate, regular rhythm and intact distal pulses. Exam reveals no gallop and no friction rub.  No murmur heard. Pulmonary/Chest: Effort normal and breath sounds normal. No respiratory distress. He has no wheezes. He has no rales. He exhibits no tenderness.  Abdominal: Soft. Bowel sounds are normal. He exhibits no distension and no mass. There is no tenderness. There is no rebound and no guarding.  Musculoskeletal: Normal range of motion. He exhibits no edema or tenderness.  Lymphadenopathy:    He has no cervical adenopathy.  Neurological: He is alert and oriented to person, place, and time. He has normal reflexes. No cranial nerve deficit. Coordination normal.  Skin: Skin is warm and dry. No rash noted. He is not diaphoretic. No erythema. No pallor.  Psychiatric: He has a normal mood and affect. His behavior is normal. Judgment and thought content normal.  Nursing note and vitals reviewed.  Vitals:   12/06/17 1047  BP: 120/74  Pulse: 82  SpO2: 95%  Weight: 139 lb 6.4 oz (63.2 kg)  Height: 5\' 10"  (1.778 m)     Estimated body mass index is 20 kg/m as calculated from the following:   Height as of this encounter: 5\' 10"  (1.778 m).   Weight as of this encounter: 139 lb 6.4 oz (63.2 kg).     Assessment:       ICD-10-CM   1. Stage 3 severe COPD by GOLD classification ([place]) J44.9   2. Cigarette smoker F17.210   3. Coronary artery calcification seen on CAT scan I25.10        Plan:      Stage 3 severe COPD by GOLD classification ([place]) - stable with mild level of symtpoms with just spiriva daily and albuterol as needed - appears you are not on symbicort - continue spiriva daily - use albuterol as needed - will hold  off symbicort  Cigarette  smoker - try to quit' -if you cannot by next visit then will discuss drug therapy next vsiit  Coronary artery calcification seen on CAT scan - refer cardiology  followup - 6 months or sooner if needed     Dr. Brand Males, M.D., [facility].C.P Pulmonary and Critical Care Medicine Staff Physician, [place] Director - Interstitial Lung Disease  Program  Pulmonary [place] [facility] [place], [place], [address]  Pager: [phone], If no answer or between  15:00h - 7:00h: call [phone] Telephone: [phone]

## 2017-12-13 ENCOUNTER — Other Ambulatory Visit (HOSPITAL_COMMUNITY): Payer: Self-pay

## 2017-12-13 NOTE — Progress Notes (Signed)
Advanced Heart Failure Clinic Note    Primary Care: Dr. Deforest Hoyles   Primary Cardiologist: Dr. Harrington Challenger, Dr. Aundra Dubin   HPI: Gerald Nelson is a 67 y.o. male with a past medical history of chronic systolic CHF (EF 63% in July 2018), severe MR/TR, pulmonary HTN, CVA, CKD, schizophrenia, HTN and tobacco abuse.   Gerald Nelson was admitted to [facility] 7/2-01/06/17 for newly diagnosed acute systolic CHF. Echo showed LVEF 15%, severe global hypokinesis, moderate LVH, coarsetrabeculation of the LV apex with numerous false tendinae of the left ventricle, very stagnant blood flow at the LV apex withsmoke but no obvious LV thrombus - Definity contrast was given,again, noting stagnant apical blood flow - this could suggestrecent thrombus and certainly high risk for apical thrombusformation. Other findings include aortic sclerosis with mild AI,moderate to severe MR, moderate LAE, mild RAE, moderate to severeTR, moderate to severe pulmonary hypertension (RVSP 73 mmHg), dilated IVC, trivial posterior pericardial effusion - tamponade physiology could be excluded given the degree of pulmonaryhypertension.He was seen by Dr. Harrington Challenger during this admission who felt that he needed a L/RHC, but patient left AMA. When he left, he did not pick up any medications due to lack of money and only continued to take ASA. He lives in a boarding house with two room mates and lives off of social security. He does have medicare. The only medication he takes regularly is haldol given to him by his psychiatrist.He has left the hospital several times before and has limited therapeutic options because of social and psych issues and compliance. He left on 10/3 and has stayed at a boarding house since then.  He left AMA 04/07/17. He was seen back in the office by Dr. Percival Spanish on 04/18/2017. Reported continued DOE, cough, and leg edema. He reported he had been taking meds but this could not be verified. It was not clear that he was compliant with sodium  restriction (eating pasta and ground beef). Prior discharge weights were variable, reportedly 159 in our office the day of his visit with Dr. Percival Spanish (with measured weight of 156 on 10/16). Due to concern for significant volume overload he was admitted for further evaluation.   He diuresed 25 pounds on IV lasix. He refused cath. His clonidine and diltazem were stopped during this admission. Referred to paramedicine. Discharge weight 133 pounds.   He presents today for regular follow up. Last visit, bisoprolol was increased. Overall feeling well. Continues to smoke 1 ppd or more. Has been seen by pulmonary who is considering medical therapy if he cannot stop on his own. He denies lightheadedness or dizziness. Paramedicine is following. They identified problems with his bill pack and he was on bisoprolol and another beta blocker for a time, but this has been corrected. Denies CP or SOB.   - Echo 08/11/17 LVEF 15-20%, no MR noted.   Review of systems complete and found to be negative unless listed in HPI.   Past Medical History:  Diagnosis Date  . Apical mural thrombus    a. question of apical thrombus on echo 01/2017, pt left AMA, not felt to be anticoag candidate with noncompliance.  . Arthritis   . CHF (congestive heart failure) ([place])   . Chronic systolic CHF (congestive heart failure) ([place])    a. pt refused cath. EF 15% 01/2017.  . CKD (chronic kidney disease), stage III ([place])   . COPD (chronic obstructive pulmonary disease) ([place])   . Hernia, hiatal   . Hyperlipidemia   . Hypertension   .  Hyponatremia   . Mitral regurgitation    a. mod-severe by echo 01/2017.  . Pulmonary hypertension ([place])   . Schizophrenia ([place])   . Tobacco abuse     Current Outpatient Medications  Medication Sig Dispense Refill  . bisoprolol (ZEBETA) 5 MG tablet Take 1 tablet (5 mg total) by mouth daily. 90 tablet 3  . furosemide (LASIX) 40 MG tablet Take 1 tablet (40 mg total) by mouth 2 (two) times daily. 60  tablet 6  . haloperidol decanoate (HALDOL DECANOATE) 100 MG/ML injection Inject 100 mg into the muscle every 30 (thirty) days.   99  . isosorbide-hydrALAZINE (BIDIL) 20-37.5 MG tablet TAKE 2 TABLETS BY MOUTH 3 (THREE) TIMES DAILY. 180 tablet 6  . KLOR-CON M20 20 MEQ tablet TAKE 1 TABLET BY MOUTH EVERY DAY 30 tablet 6  . losartan (COZAAR) 25 MG tablet Take 1 tablet (25 mg total) by mouth daily. 90 tablet 3  . spironolactone (ALDACTONE) 25 MG tablet TAKE 1 TABLET BY MOUTH EVERY DAY 30 tablet 11  . albuterol (PROVENTIL HFA;VENTOLIN HFA) 108 (90 Base) MCG/ACT inhaler Inhale 1-2 puffs into the lungs every 6 (six) hours as needed for wheezing or shortness of breath.    Marland Kitchen aspirin 81 MG EC tablet TAKE 1 TABLET BY MOUTH EVERY DAY 30 tablet 1  . atorvastatin (LIPITOR) 40 MG tablet TAKE 1 TABLET BY MOUTH EVERY DAY IN THE EVENING 30 tablet 6  . budesonide-formoterol (SYMBICORT) 160-4.5 MCG/ACT inhaler Inhale 2 puffs into the lungs 2 (two) times daily. 1 Inhaler 6  . nicotine (NICODERM CQ - DOSED IN MG/24 HOURS) 14 mg/24hr patch Place 1 patch (14 mg total) onto the skin daily. 28 patch 0  . Tiotropium Bromide Monohydrate (SPIRIVA RESPIMAT) 2.5 MCG/ACT AERS Inhale 1 spray into the lungs daily. 1 Inhaler 5   No current facility-administered medications for this encounter.    Allergies  Allergen Reactions  . Sulfa Antibiotics Nausea Only   Social History   Socioeconomic History  . Marital status: Single    Spouse name: Not on file  . Number of children: Not on file  . Years of education: Not on file  . Highest education level: Not on file  Occupational History  . Not on file  Social Needs  . Financial resource strain: Not on file  . Food insecurity:    Worry: Not on file    Inability: Not on file  . Transportation needs:    Medical: Not on file    Non-medical: Not on file  Tobacco Use  . Smoking status: Current Every Day Smoker    Packs/day: 1.50    Years: 44.00    Pack years: 66.00     Types: Cigarettes    Start date: 35  . Smokeless tobacco: Never Used  . Tobacco comment: 1ppd as of 12/06/17 ep  Substance and Sexual Activity  . Alcohol use: No  . Drug use: No  . Sexual activity: Not on file  Lifestyle  . Physical activity:    Days per week: Not on file    Minutes per session: Not on file  . Stress: Not on file  Relationships  . Social connections:    Talks on phone: Not on file    Gets together: Not on file    Attends religious service: Not on file    Active member of club or organization: Not on file    Attends meetings of clubs or organizations: Not on file    Relationship status:  Not on file  . Intimate partner violence:    Fear of current or ex partner: Not on file    Emotionally abused: Not on file    Physically abused: Not on file    Forced sexual activity: Not on file  Other Topics Concern  . Not on file  Social History Narrative  . Not on file      Family History  Problem Relation Age of Onset  . Hypertension Mother   . Hypertension Father    Vitals:   12/14/17 0939  BP: 134/80  Pulse: 74  SpO2: 99%  Weight: 148 lb (67.1 kg)   Wt Readings from Last 3 Encounters:  12/14/17 148 lb (67.1 kg)  12/06/17 139 lb 6.4 oz (63.2 kg)  12/02/17 143 lb (64.9 kg)   PHYSICAL EXAM: General: Well appearing. No resp difficulty. HEENT: Normal Neck: Supple. JVP 6-7. Carotids 2+ bilat; no bruits. No thyromegaly or nodule noted. Cor: PMI nondisplaced. RRR, 2/6 SEM Lungs: CTAB, normal effort. Abdomen: Soft, non-tender, non-distended, no HSM. No bruits or masses. +BS  Extremities: No cyanosis, clubbing, or rash. R and LLE no edema.  Neuro: Alert & orientedx3, cranial nerves grossly intact. moves all 4 extremities w/o difficulty. Affect pleasant  ASSESSMENT & PLAN: 1. Chronic systolic CHF: Echo 02/5026 EF 15%.  - Echo 08/11/17 LVEF 15-20%, no MR noted.  - NYHA II symptoms.  - Volume status stable on exam.  - Continue lasix 40 mg BID. Can take extra 40  mg as needed.  - Continue bisoprolol 5 mg daily.  - Continue losartan 25 mg daily. Not candidate for [facility] with CKD. BMET today.  - Continue spiro 25 mg daily.  - Met with EP and pt refused ICD. Per EP he is poor candidate with co-morbidities.  - Reinforced fluid restriction to < 2 L daily, sodium restriction to less than 2000 mg daily, and the importance of daily weights.    2. Questionable apical thrombus:  - Poor anticoagulation candidate. No change.   3. MR - Mod/Sev on last echo. Suspect functional.  - Not noted at all on Echo 08/11/17. No change.   4. Tobacco abuse - Continues to smoke > 1 ppd. He is trying to quit by himself. Cannot afford nicotine patches.  - Working with pulmonary as well. They are considering medical therapy.   5. CKD III:  - BMET today  6. SCHIZOPHRENIA:  - Follows with PCP. No change.   7. HTN - Stable at home via paramedicine. Will not change meds today.   8. COPD - Encouraged complete smoking cessation.  - No s/s of AECOPD at this time.  - Follows with Dr Purnell Shoemaker  Labs today. RTC 3 months. Sooner with symptoms.   Shirley Friar, PA-C 12/14/17   Greater than 50% of the 25 minute visit was spent in counseling/coordination of care regarding disease state education, salt/fluid restriction, sliding scale diuretics, and medication compliance.

## 2017-12-13 NOTE — Progress Notes (Signed)
I picked up pt's medications to bring to the heart failure clinic tomorrow, just in case there's a medication change.  Pt has hx of forgetting his medications sometimes when he comes to the clinic.  He was advised to still bring the pill bubble pack with him to the appointment.

## 2017-12-14 ENCOUNTER — Other Ambulatory Visit (HOSPITAL_COMMUNITY): Payer: Self-pay

## 2017-12-14 ENCOUNTER — Encounter (HOSPITAL_COMMUNITY): Payer: Self-pay

## 2017-12-14 ENCOUNTER — Telehealth (HOSPITAL_COMMUNITY): Payer: Self-pay | Admitting: Cardiology

## 2017-12-14 ENCOUNTER — Ambulatory Visit (HOSPITAL_COMMUNITY)
Admission: RE | Admit: 2017-12-14 | Discharge: 2017-12-14 | Disposition: A | Discharge status: Home / Self Care | Payer: Medicare Other | Source: Ambulatory Visit | Attending: Internal Medicine | Admitting: Internal Medicine

## 2017-12-14 VITALS — BP 134/80 | HR 74 | Wt 148.0 lb

## 2017-12-14 DIAGNOSIS — Z8673 Personal history of transient ischemic attack (TIA), and cerebral infarction without residual deficits: Secondary | ICD-10-CM | POA: Insufficient documentation

## 2017-12-14 DIAGNOSIS — Z720 Tobacco use: Secondary | ICD-10-CM | POA: Diagnosis not present

## 2017-12-14 DIAGNOSIS — I130 Hypertensive heart and chronic kidney disease with heart failure and stage 1 through stage 4 chronic kidney disease, or unspecified chronic kidney disease: Secondary | ICD-10-CM | POA: Diagnosis not present

## 2017-12-14 DIAGNOSIS — E785 Hyperlipidemia, unspecified: Secondary | ICD-10-CM | POA: Insufficient documentation

## 2017-12-14 DIAGNOSIS — M1990 Unspecified osteoarthritis, unspecified site: Secondary | ICD-10-CM | POA: Diagnosis not present

## 2017-12-14 DIAGNOSIS — K449 Diaphragmatic hernia without obstruction or gangrene: Secondary | ICD-10-CM | POA: Diagnosis not present

## 2017-12-14 DIAGNOSIS — I081 Rheumatic disorders of both mitral and tricuspid valves: Secondary | ICD-10-CM | POA: Insufficient documentation

## 2017-12-14 DIAGNOSIS — Z79899 Other long term (current) drug therapy: Secondary | ICD-10-CM | POA: Diagnosis not present

## 2017-12-14 DIAGNOSIS — J449 Chronic obstructive pulmonary disease, unspecified: Secondary | ICD-10-CM | POA: Diagnosis not present

## 2017-12-14 DIAGNOSIS — Z8249 Family history of ischemic heart disease and other diseases of the circulatory system: Secondary | ICD-10-CM | POA: Insufficient documentation

## 2017-12-14 DIAGNOSIS — I10 Essential (primary) hypertension: Secondary | ICD-10-CM

## 2017-12-14 DIAGNOSIS — N183 Chronic kidney disease, stage 3 unspecified: Secondary | ICD-10-CM

## 2017-12-14 DIAGNOSIS — I2510 Atherosclerotic heart disease of native coronary artery without angina pectoris: Secondary | ICD-10-CM | POA: Diagnosis not present

## 2017-12-14 DIAGNOSIS — F17210 Nicotine dependence, cigarettes, uncomplicated: Secondary | ICD-10-CM | POA: Diagnosis not present

## 2017-12-14 DIAGNOSIS — I272 Pulmonary hypertension, unspecified: Secondary | ICD-10-CM | POA: Diagnosis not present

## 2017-12-14 DIAGNOSIS — Z7982 Long term (current) use of aspirin: Secondary | ICD-10-CM | POA: Insufficient documentation

## 2017-12-14 DIAGNOSIS — I340 Nonrheumatic mitral (valve) insufficiency: Secondary | ICD-10-CM | POA: Diagnosis not present

## 2017-12-14 DIAGNOSIS — I509 Heart failure, unspecified: Secondary | ICD-10-CM

## 2017-12-14 DIAGNOSIS — F209 Schizophrenia, unspecified: Secondary | ICD-10-CM | POA: Insufficient documentation

## 2017-12-14 DIAGNOSIS — I5022 Chronic systolic (congestive) heart failure: Secondary | ICD-10-CM

## 2017-12-14 LAB — BASIC METABOLIC PANEL
Anion gap: 10 (ref 5–15)
BUN: 36 mg/dL — AB (ref 6–20)
CHLORIDE: 100 mmol/L — AB (ref 101–111)
CO2: 25 mmol/L (ref 22–32)
Calcium: 9.7 mg/dL (ref 8.9–10.3)
Creatinine, Ser: 2.42 mg/dL — ABNORMAL HIGH (ref 0.61–1.24)
GFR calc Af Amer: 31 mL/min — ABNORMAL LOW (ref >60)
GFR, EST NON AFRICAN AMERICAN: 26 mL/min — AB (ref >60)
GLUCOSE: 102 mg/dL — AB (ref 65–99)
POTASSIUM: 4.5 mmol/L (ref 3.5–5.1)
Sodium: 135 mmol/L (ref 135–145)

## 2017-12-14 NOTE — Telephone Encounter (Signed)
-----   Message from Shirley Friar, PA-C sent at 12/14/2017 11:37 AM EDT ----- Creatinine up from baseline of 2.0 - 2.2, though there were some recent confusions with his medicine along with hypotension. Would hold lasix x 2 doses, and then repeat 1 week.     Legrand Como [address] [place], PA-C 12/14/2017 11:37 AM

## 2017-12-14 NOTE — Progress Notes (Signed)
Paramedicine Encounter   Patient ID: Gerald Nelson , male,   DOB: 07-Nov-1950,67 y.o.,  MRN: [id number]   Met patient in clinic today with provider [name].   Pt took his medication (bubble packs) with him for this visit.  He has taken his morning meds; has been using the bubble packs without difficulty.  Pt denies sob, dizziness or chest pain.  Pt has no complaints during this visit.  Blood work done during this visit.  Next visit is 3 months out.    Dr. Deforest Hoyles has released pt to work part time with 15 hours a week; no heavy lifting over 15lbs and no stumping over.  Pt stated that his psychiatrist has also released him to work.     *no med changes during this visit.  Time spent with patient Lynn Keyden Pavlov, EMT-Paramedic 12/14/2017   ACTION: Next visit planned for in two weeks

## 2017-12-14 NOTE — Patient Instructions (Signed)
No changes to medication at this time.  Routine lab work today. Will notify you of abnormal results, otherwise no news is good news!  Follow up with Dr. Aundra Dubin in 3 months.  ______________________________________________________________ Gerald Nelson Code:  7902  Take all medication as prescribed the day of your appointment. Bring all medications with you to your appointment.  Do the following things EVERYDAY: 1) Weigh yourself in the morning before breakfast. Write it down and keep it in a log. 2) Take your medicines as prescribed 3) Eat low salt foods-Limit salt (sodium) to 2000 mg per day.  4) Stay as active as you can everyday 5) Limit all fluids for the day to less than 2 liters

## 2017-12-14 NOTE — Telephone Encounter (Signed)
Notes recorded by Kerry Dory, CMA on 12/14/2017 at 12:10 PM EDT [facility] with para medicine will assist with holding medications as patient gets pills filled thru bubble pill packing service @ pharmacy. Patient aware repeat labs 6/19 ------  Notes recorded by Shirley Friar, PA-C on 12/14/2017 at 11:37 AM EDT Creatinine up from baseline of 2.0 - 2.2, though there were some recent confusions with his medicine along with hypotension. Would hold lasix x 2 doses, and then repeat 1 week.     Legrand Como [address] [place], PA-C 12/14/2017 11:37 AM

## 2017-12-14 NOTE — Progress Notes (Signed)
Paramedicine Encounter    Patient ID: Gerald Nelson, male    DOB: 1950/08/02, 67 y.o.   MRN: [id number]    Patient Care Team: [name], MD as PCP - General (Internal Medicine)  Patient Active Problem List   Diagnosis Date Noted  . Noncompliance 04/23/2017  . Mitral regurgitation 04/23/2017  . Thrombus - possible apical thrombus 01/2017 04/23/2017  . Hyperlipidemia 04/23/2017  . Pulmonary hypertension ([place])   . SOB (shortness of breath)   . Palliative care by specialist   . Tobacco abuse 04/19/2017  . Acute respiratory failure with hypoxia ([place]) 01/27/2017  . Hypertensive urgency 01/27/2017  . CHF (congestive heart failure) ([place]) 01/26/2017  . CKD (chronic kidney disease), stage III ([place]) 01/04/2017  . Essential hypertension 01/04/2017  . CVA (cerebral infarction) 12/12/2013  . Hyponatremia 12/06/2012  . Hiatal hernia 12/06/2012  . Syncope 12/06/2012  . Schizophrenia ([place])     Current Outpatient Medications:  .  albuterol (PROVENTIL HFA;VENTOLIN HFA) 108 (90 Base) MCG/ACT inhaler, Inhale 1-2 puffs into the lungs every 6 (six) hours as needed for wheezing or shortness of breath., Disp: , Rfl:  .  aspirin 81 MG EC tablet, TAKE 1 TABLET BY MOUTH EVERY DAY, Disp: 30 tablet, Rfl: 1 .  atorvastatin (LIPITOR) 40 MG tablet, TAKE 1 TABLET BY MOUTH EVERY DAY IN THE EVENING, Disp: 30 tablet, Rfl: 6 .  bisoprolol (ZEBETA) 5 MG tablet, Take 1 tablet (5 mg total) by mouth daily., Disp: 90 tablet, Rfl: 3 .  budesonide-formoterol (SYMBICORT) 160-4.5 MCG/ACT inhaler, Inhale 2 puffs into the lungs 2 (two) times daily., Disp: 1 Inhaler, Rfl: 6 .  furosemide (LASIX) 40 MG tablet, Take 1 tablet (40 mg total) by mouth 2 (two) times daily., Disp: 60 tablet, Rfl: 6 .  haloperidol decanoate (HALDOL DECANOATE) 100 MG/ML injection, Inject 100 mg into the muscle every 30 (thirty) days. , Disp: , Rfl: 99 .  isosorbide-hydrALAZINE (BIDIL) 20-37.5 MG tablet, TAKE 2 TABLETS BY MOUTH 3 (THREE) TIMES DAILY.,  Disp: 180 tablet, Rfl: 6 .  KLOR-CON M20 20 MEQ tablet, TAKE 1 TABLET BY MOUTH EVERY DAY, Disp: 30 tablet, Rfl: 6 .  losartan (COZAAR) 25 MG tablet, Take 1 tablet (25 mg total) by mouth daily., Disp: 90 tablet, Rfl: 3 .  nicotine (NICODERM CQ - DOSED IN MG/24 HOURS) 14 mg/24hr patch, Place 1 patch (14 mg total) onto the skin daily., Disp: 28 patch, Rfl: 0 .  spironolactone (ALDACTONE) 25 MG tablet, TAKE 1 TABLET BY MOUTH EVERY DAY, Disp: 30 tablet, Rfl: 11 .  Tiotropium Bromide Monohydrate (SPIRIVA RESPIMAT) 2.5 MCG/ACT AERS, Inhale 1 spray into the lungs daily., Disp: 1 Inhaler, Rfl: 5 Allergies  Allergen Reactions  . Sulfa Antibiotics Nausea Only     Social History   Socioeconomic History  . Marital status: Single    Spouse name: Not on file  . Number of children: Not on file  . Years of education: Not on file  . Highest education level: Not on file  Occupational History  . Not on file  Social Needs  . Financial resource strain: Not on file  . Food insecurity:    Worry: Not on file    Inability: Not on file  . Transportation needs:    Medical: Not on file    Non-medical: Not on file  Tobacco Use  . Smoking status: Current Every Day Smoker    Packs/day: 1.50    Years: 44.00    Pack years: 66.00  Types: Cigarettes    Start date: 72  . Smokeless tobacco: Never Used  . Tobacco comment: 1ppd as of 12/06/17 ep  Substance and Sexual Activity  . Alcohol use: No  . Drug use: No  . Sexual activity: Not on file  Lifestyle  . Physical activity:    Days per week: Not on file    Minutes per session: Not on file  . Stress: Not on file  Relationships  . Social connections:    Talks on phone: Not on file    Gets together: Not on file    Attends religious service: Not on file    Active member of club or organization: Not on file    Attends meetings of clubs or organizations: Not on file    Relationship status: Not on file  . Intimate partner violence:    Fear of current or  ex partner: Not on file    Emotionally abused: Not on file    Physically abused: Not on file    Forced sexual activity: Not on file  Other Topics Concern  . Not on file  Social History Narrative  . Not on file    Physical Exam      Future Appointments  Date Time Provider [place]  12/21/2017 11:30 AM [facility] None  03/20/2018 10:20 AM [name], MD [facility] None    ATF pt CAO x4 standing in the living room with his pill bubble packs.  The Heart failure clinic and advised me that pt is to stop furosemide for two days due to lab work that was done today.  I took the pills for today and tomorrow and placed them in pts pill box, he understands that he is to take the pills in the pill box until Friday morning.  BP 130/72 (BP Location: Left Arm, Patient Position: Standing, Cuff Size: Normal)   Pulse 83   Resp 20   SpO2 97%      Bridgette Wolden, EMT Paramedic 12/14/2017    ACTION: Home visit completed

## 2017-12-21 ENCOUNTER — Other Ambulatory Visit (HOSPITAL_COMMUNITY): Payer: Self-pay

## 2017-12-22 ENCOUNTER — Ambulatory Visit (HOSPITAL_COMMUNITY)
Admission: RE | Admit: 2017-12-22 | Discharge: 2017-12-22 | Disposition: A | Discharge status: Home / Self Care | Payer: Medicare Other | Source: Ambulatory Visit | Attending: Cardiology | Admitting: Cardiology

## 2017-12-22 DIAGNOSIS — I509 Heart failure, unspecified: Secondary | ICD-10-CM

## 2017-12-22 LAB — BASIC METABOLIC PANEL
ANION GAP: 7 (ref 5–15)
BUN: 26 mg/dL — ABNORMAL HIGH (ref 6–20)
CALCIUM: 9.7 mg/dL (ref 8.9–10.3)
CHLORIDE: 105 mmol/L (ref 101–111)
CO2: 27 mmol/L (ref 22–32)
Creatinine, Ser: 2.2 mg/dL — ABNORMAL HIGH (ref 0.61–1.24)
GFR calc non Af Amer: 30 mL/min — ABNORMAL LOW (ref >60)
GFR, EST AFRICAN AMERICAN: 34 mL/min — AB (ref >60)
GLUCOSE: 99 mg/dL (ref 65–99)
Potassium: 3.8 mmol/L (ref 3.5–5.1)
Sodium: 139 mmol/L (ref 135–145)

## 2017-12-23 ENCOUNTER — Other Ambulatory Visit (HOSPITAL_COMMUNITY): Payer: Self-pay

## 2017-12-23 NOTE — Progress Notes (Signed)
Pt called and asked if I could come by and explain to him how to use the nicotine patches plus lozenge.  I explained both to pt according to the directions on the box.  He was able to repeat the steps to me on how to apply the patch, when to take it off and so on.  Pt denies sob, dizziness and chest pain.  Pt will receive all doses from the company free to pt. Pt put one on today before I left.

## 2017-12-28 ENCOUNTER — Ambulatory Visit: Payer: Self-pay | Admitting: Internal Medicine

## 2018-01-02 ENCOUNTER — Other Ambulatory Visit (HOSPITAL_COMMUNITY): Payer: Self-pay

## 2018-01-02 MED ORDER — FUROSEMIDE 40 MG PO TABS: 40.0000 mg | ORAL_TABLET | Freq: Two times a day (BID) | ORAL | 6 refills | Status: DC

## 2018-01-03 ENCOUNTER — Other Ambulatory Visit (HOSPITAL_COMMUNITY): Payer: Self-pay

## 2018-01-03 NOTE — Progress Notes (Signed)
Paramedicine Encounter    Patient ID: Gerald Nelson, male    DOB: 1951-02-13, 67 y.o.   MRN: [id number]    Patient Care Team: [name], MD as PCP - General (Internal Medicine)  Patient Active Problem List   Diagnosis Date Noted  . Noncompliance 04/23/2017  . Mitral regurgitation 04/23/2017  . Thrombus - possible apical thrombus 01/2017 04/23/2017  . Hyperlipidemia 04/23/2017  . Pulmonary hypertension ([place])   . SOB (shortness of breath)   . Palliative care by specialist   . Tobacco abuse 04/19/2017  . Acute respiratory failure with hypoxia ([place]) 01/27/2017  . Hypertensive urgency 01/27/2017  . CHF (congestive heart failure) ([place]) 01/26/2017  . CKD (chronic kidney disease), stage III ([place]) 01/04/2017  . Essential hypertension 01/04/2017  . CVA (cerebral infarction) 12/12/2013  . Hyponatremia 12/06/2012  . Hiatal hernia 12/06/2012  . Syncope 12/06/2012  . Schizophrenia ([place])     Current Outpatient Medications:  .  nicotine (NICODERM CQ - DOSED IN MG/24 HOURS) 14 mg/24hr patch, Place 1 patch (14 mg total) onto the skin daily., Disp: 28 patch, Rfl: 0 .  albuterol (PROVENTIL HFA;VENTOLIN HFA) 108 (90 Base) MCG/ACT inhaler, Inhale 1-2 puffs into the lungs every 6 (six) hours as needed for wheezing or shortness of breath., Disp: , Rfl:  .  aspirin 81 MG EC tablet, TAKE 1 TABLET BY MOUTH EVERY DAY, Disp: 30 tablet, Rfl: 1 .  atorvastatin (LIPITOR) 40 MG tablet, TAKE 1 TABLET BY MOUTH EVERY DAY IN THE EVENING, Disp: 30 tablet, Rfl: 6 .  bisoprolol (ZEBETA) 5 MG tablet, Take 1 tablet (5 mg total) by mouth daily., Disp: 90 tablet, Rfl: 3 .  budesonide-formoterol (SYMBICORT) 160-4.5 MCG/ACT inhaler, Inhale 2 puffs into the lungs 2 (two) times daily., Disp: 1 Inhaler, Rfl: 6 .  furosemide (LASIX) 40 MG tablet, Take 1 tablet (40 mg total) by mouth 2 (two) times daily., Disp: 60 tablet, Rfl: 6 .  haloperidol decanoate (HALDOL DECANOATE) 100 MG/ML injection, Inject 100 mg into the muscle  every 30 (thirty) days. , Disp: , Rfl: 99 .  isosorbide-hydrALAZINE (BIDIL) 20-37.5 MG tablet, TAKE 2 TABLETS BY MOUTH 3 (THREE) TIMES DAILY., Disp: 180 tablet, Rfl: 6 .  KLOR-CON M20 20 MEQ tablet, TAKE 1 TABLET BY MOUTH EVERY DAY, Disp: 30 tablet, Rfl: 6 .  losartan (COZAAR) 25 MG tablet, Take 1 tablet (25 mg total) by mouth daily., Disp: 90 tablet, Rfl: 3 .  spironolactone (ALDACTONE) 25 MG tablet, TAKE 1 TABLET BY MOUTH EVERY DAY, Disp: 30 tablet, Rfl: 11 .  Tiotropium Bromide Monohydrate (SPIRIVA RESPIMAT) 2.5 MCG/ACT AERS, Inhale 1 spray into the lungs daily., Disp: 1 Inhaler, Rfl: 5 Allergies  Allergen Reactions  . Sulfa Antibiotics Nausea Only     Social History   Socioeconomic History  . Marital status: Single    Spouse name: Not on file  . Number of children: Not on file  . Years of education: Not on file  . Highest education level: Not on file  Occupational History  . Not on file  Social Needs  . Financial resource strain: Not on file  . Food insecurity:    Worry: Not on file    Inability: Not on file  . Transportation needs:    Medical: Not on file    Non-medical: Not on file  Tobacco Use  . Smoking status: Current Every Day Smoker    Packs/day: 1.50    Years: 44.00    Pack years: 66.00  Types: Cigarettes    Start date: 43  . Smokeless tobacco: Never Used  . Tobacco comment: 1ppd as of 12/06/17 ep  Substance and Sexual Activity  . Alcohol use: No  . Drug use: No  . Sexual activity: Not on file  Lifestyle  . Physical activity:    Days per week: Not on file    Minutes per session: Not on file  . Stress: Not on file  Relationships  . Social connections:    Talks on phone: Not on file    Gets together: Not on file    Attends religious service: Not on file    Active member of club or organization: Not on file    Attends meetings of clubs or organizations: Not on file    Relationship status: Not on file  . Intimate partner violence:    Fear of current  or ex partner: Not on file    Emotionally abused: Not on file    Physically abused: Not on file    Forced sexual activity: Not on file  Other Topics Concern  . Not on file  Social History Narrative  . Not on file    Physical Exam      Future Appointments  Date Time Provider [place]  03/13/2018  3:00 PM Thompson Grayer, MD [facility]  03/20/2018 10:20 AM [name], MD [facility] None    ATF pt CAO x4 sitting on the porch. Today's visit is to verify pt's med bubble packs.  Pt recieves his meds from [facility].  The haldol will be ready for pick up tomorrow; pt has transportation to pick it up.  He stopped the nicotin patches within 3 days because he didn't stop smoking immediately; we discussed the process and he agrees to retry.  BP 110/70 (BP Location: Left Arm, Patient Position: Standing, Cuff Size: Normal)   Pulse 98   Resp 16   Wt 146 lb 4.8 oz (66.4 kg)   SpO2 97%   BMI 20.99 kg/m     Gerald Nelson, EMT Paramedic 01/03/2018    ACTION: Home visit completed

## 2018-01-27 ENCOUNTER — Other Ambulatory Visit: Payer: Self-pay | Admitting: Internal Medicine

## 2018-01-27 NOTE — Telephone Encounter (Signed)
This is a CHF pt. Pt has never seen Dr. Harrington Challenger. Please address

## 2018-02-08 ENCOUNTER — Other Ambulatory Visit (HOSPITAL_COMMUNITY): Payer: Self-pay

## 2018-02-08 NOTE — Progress Notes (Signed)
Paramedicine Encounter    Patient ID: Gerald Nelson, male    DOB: 07/19/1950, 67 y.o.   MRN: [id number]    Patient Care Team: [name], MD as PCP - General (Internal Medicine)  Patient Active Problem List   Diagnosis Date Noted  . Noncompliance 04/23/2017  . Mitral regurgitation 04/23/2017  . Thrombus - possible apical thrombus 01/2017 04/23/2017  . Hyperlipidemia 04/23/2017  . Pulmonary hypertension ([place])   . SOB (shortness of breath)   . Palliative care by specialist   . Tobacco abuse 04/19/2017  . Acute respiratory failure with hypoxia ([place]) 01/27/2017  . Hypertensive urgency 01/27/2017  . CHF (congestive heart failure) ([place]) 01/26/2017  . CKD (chronic kidney disease), stage III ([place]) 01/04/2017  . Essential hypertension 01/04/2017  . CVA (cerebral infarction) 12/12/2013  . Hyponatremia 12/06/2012  . Hiatal hernia 12/06/2012  . Syncope 12/06/2012  . Schizophrenia ([place])     Current Outpatient Medications:  .  albuterol (PROVENTIL HFA;VENTOLIN HFA) 108 (90 Base) MCG/ACT inhaler, Inhale 1-2 puffs into the lungs every 6 (six) hours as needed for wheezing or shortness of breath., Disp: , Rfl:  .  aspirin 81 MG EC tablet, TAKE 1 TABLET BY MOUTH EVERY DAY, Disp: 30 tablet, Rfl: 1 .  atorvastatin (LIPITOR) 40 MG tablet, TAKE ONE TABLET BY MOUTH EVERY DAY IN THE EVENING, Disp: 30 tablet, Rfl: 6 .  bisoprolol (ZEBETA) 5 MG tablet, Take 1 tablet (5 mg total) by mouth daily., Disp: 90 tablet, Rfl: 3 .  furosemide (LASIX) 40 MG tablet, Take 1 tablet (40 mg total) by mouth 2 (two) times daily., Disp: 60 tablet, Rfl: 6 .  haloperidol decanoate (HALDOL DECANOATE) 100 MG/ML injection, Inject 100 mg into the muscle every 30 (thirty) days. , Disp: , Rfl: 99 .  isosorbide-hydrALAZINE (BIDIL) 20-37.5 MG tablet, TAKE 2 TABLETS BY MOUTH 3 (THREE) TIMES DAILY., Disp: 180 tablet, Rfl: 6 .  potassium chloride SA (K-DUR,KLOR-CON) 20 MEQ tablet, TAKE ONE TABLET BY MOUTH ONCE DAILY (AM), Disp: 30  tablet, Rfl: 6 .  spironolactone (ALDACTONE) 25 MG tablet, TAKE 1 TABLET BY MOUTH EVERY DAY, Disp: 30 tablet, Rfl: 11 .  budesonide-formoterol (SYMBICORT) 160-4.5 MCG/ACT inhaler, Inhale 2 puffs into the lungs 2 (two) times daily., Disp: 1 Inhaler, Rfl: 6 .  losartan (COZAAR) 25 MG tablet, Take 1 tablet (25 mg total) by mouth daily., Disp: 90 tablet, Rfl: 3 .  nicotine (NICODERM CQ - DOSED IN MG/24 HOURS) 14 mg/24hr patch, Place 1 patch (14 mg total) onto the skin daily., Disp: 28 patch, Rfl: 0 .  Tiotropium Bromide Monohydrate (SPIRIVA RESPIMAT) 2.5 MCG/ACT AERS, Inhale 1 spray into the lungs daily., Disp: 1 Inhaler, Rfl: 5 Allergies  Allergen Reactions  . Sulfa Antibiotics Nausea Only     Social History   Socioeconomic History  . Marital status: Single    Spouse name: Not on file  . Number of children: Not on file  . Years of education: Not on file  . Highest education level: Not on file  Occupational History  . Not on file  Social Needs  . Financial resource strain: Not on file  . Food insecurity:    Worry: Not on file    Inability: Not on file  . Transportation needs:    Medical: Not on file    Non-medical: Not on file  Tobacco Use  . Smoking status: Current Every Day Smoker    Packs/day: 1.50    Years: 44.00    Pack years: 66.00  Types: Cigarettes    Start date: 97  . Smokeless tobacco: Never Used  . Tobacco comment: 1ppd as of 12/06/17 ep  Substance and Sexual Activity  . Alcohol use: No  . Drug use: No  . Sexual activity: Not on file  Lifestyle  . Physical activity:    Days per week: Not on file    Minutes per session: Not on file  . Stress: Not on file  Relationships  . Social connections:    Talks on phone: Not on file    Gets together: Not on file    Attends religious service: Not on file    Active member of club or organization: Not on file    Attends meetings of clubs or organizations: Not on file    Relationship status: Not on file  . Intimate  partner violence:    Fear of current or ex partner: Not on file    Emotionally abused: Not on file    Physically abused: Not on file    Forced sexual activity: Not on file  Other Topics Concern  . Not on file  Social History Narrative  . Not on file    Physical Exam      Future Appointments  Date Time Provider [place]  03/13/2018  3:00 PM Thompson Grayer, MD [facility]  03/20/2018 10:20 AM [name], MD [facility] None     Pulse 98   Resp 16   Wt 145 lb 1.6 oz (65.8 kg)   SpO2 97%   BMI 20.82 kg/m   Weight yesterday-143 Last visit weight-143  ATF pt CAO x4 standing on the porch smoking.  Pt had a appointment today with his pcp; no issues noted.  Pt is still smoking 1.5 packs of cigarettes daily; he stated that he's going to try to smoke less (1 pack daily).  Pt stated that he's still walking around the park, to the bus stop, and running errands via bus route.  Pt stated that his breathing has gotten a lot better.  Pt denies chest pian, dizziness and sob; pt hasn't needed to increase the amount of pillows that he use to sleep.  Pt's pill packs verified; ASA missing.  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    Medication ordered ASA  Jazma Pickel, EMT Paramedic [phone] 02/08/2018    ACTION: Home visit completed

## 2018-02-28 ENCOUNTER — Ambulatory Visit (INDEPENDENT_AMBULATORY_CARE_PROVIDER_SITE_OTHER): Payer: Self-pay | Admitting: Family Medicine

## 2018-02-28 ENCOUNTER — Telehealth (HOSPITAL_COMMUNITY): Payer: Self-pay

## 2018-03-01 ENCOUNTER — Other Ambulatory Visit (HOSPITAL_COMMUNITY): Payer: Self-pay

## 2018-03-01 NOTE — Telephone Encounter (Signed)
Pt called and stated that his pill bubble packs were in but he has plans this afternoon, so he scheduled a appointment for tomorrow.

## 2018-03-01 NOTE — Progress Notes (Signed)
Paramedicine Encounter    Patient ID: Gerald Nelson, male    DOB: 05/25/1951, 67 y.o.   MRN: [id number]    Patient Care Team: [name], MD as PCP - General (Internal Medicine)  Patient Active Problem List   Diagnosis Date Noted  . Noncompliance 04/23/2017  . Mitral regurgitation 04/23/2017  . Thrombus - possible apical thrombus 01/2017 04/23/2017  . Hyperlipidemia 04/23/2017  . Pulmonary hypertension ([place])   . SOB (shortness of breath)   . Palliative care by specialist   . Tobacco abuse 04/19/2017  . Acute respiratory failure with hypoxia ([place]) 01/27/2017  . Hypertensive urgency 01/27/2017  . CHF (congestive heart failure) ([place]) 01/26/2017  . CKD (chronic kidney disease), stage III ([place]) 01/04/2017  . Essential hypertension 01/04/2017  . CVA (cerebral infarction) 12/12/2013  . Hyponatremia 12/06/2012  . Hiatal hernia 12/06/2012  . Syncope 12/06/2012  . Schizophrenia ([place])     Current Outpatient Medications:  .  albuterol (PROVENTIL HFA;VENTOLIN HFA) 108 (90 Base) MCG/ACT inhaler, Inhale 1-2 puffs into the lungs every 6 (six) hours as needed for wheezing or shortness of breath., Disp: , Rfl:  .  aspirin 81 MG EC tablet, TAKE 1 TABLET BY MOUTH EVERY DAY, Disp: 30 tablet, Rfl: 1 .  atorvastatin (LIPITOR) 40 MG tablet, TAKE ONE TABLET BY MOUTH EVERY DAY IN THE EVENING, Disp: 30 tablet, Rfl: 6 .  bisoprolol (ZEBETA) 5 MG tablet, Take 1 tablet (5 mg total) by mouth daily., Disp: 90 tablet, Rfl: 3 .  budesonide-formoterol (SYMBICORT) 160-4.5 MCG/ACT inhaler, Inhale 2 puffs into the lungs 2 (two) times daily., Disp: 1 Inhaler, Rfl: 6 .  furosemide (LASIX) 40 MG tablet, Take 1 tablet (40 mg total) by mouth 2 (two) times daily., Disp: 60 tablet, Rfl: 6 .  haloperidol decanoate (HALDOL DECANOATE) 100 MG/ML injection, Inject 100 mg into the muscle every 30 (thirty) days. , Disp: , Rfl: 99 .  isosorbide-hydrALAZINE (BIDIL) 20-37.5 MG tablet, TAKE 2 TABLETS BY MOUTH 3 (THREE) TIMES  DAILY., Disp: 180 tablet, Rfl: 6 .  losartan (COZAAR) 25 MG tablet, Take 1 tablet (25 mg total) by mouth daily., Disp: 90 tablet, Rfl: 3 .  nicotine (NICODERM CQ - DOSED IN MG/24 HOURS) 14 mg/24hr patch, Place 1 patch (14 mg total) onto the skin daily., Disp: 28 patch, Rfl: 0 .  potassium chloride SA (K-DUR,KLOR-CON) 20 MEQ tablet, TAKE ONE TABLET BY MOUTH ONCE DAILY (AM), Disp: 30 tablet, Rfl: 6 .  spironolactone (ALDACTONE) 25 MG tablet, TAKE 1 TABLET BY MOUTH EVERY DAY, Disp: 30 tablet, Rfl: 11 .  Tiotropium Bromide Monohydrate (SPIRIVA RESPIMAT) 2.5 MCG/ACT AERS, Inhale 1 spray into the lungs daily., Disp: 1 Inhaler, Rfl: 5 Allergies  Allergen Reactions  . Sulfa Antibiotics Nausea Only     Social History   Socioeconomic History  . Marital status: Single    Spouse name: Not on file  . Number of children: Not on file  . Years of education: Not on file  . Highest education level: Not on file  Occupational History  . Not on file  Social Needs  . Financial resource strain: Not on file  . Food insecurity:    Worry: Not on file    Inability: Not on file  . Transportation needs:    Medical: Not on file    Non-medical: Not on file  Tobacco Use  . Smoking status: Current Every Day Smoker    Packs/day: 1.50    Years: 44.00    Pack years: 66.00  Types: Cigarettes    Start date: 77  . Smokeless tobacco: Never Used  . Tobacco comment: 1ppd as of 12/06/17 ep  Substance and Sexual Activity  . Alcohol use: No  . Drug use: No  . Sexual activity: Not on file  Lifestyle  . Physical activity:    Days per week: Not on file    Minutes per session: Not on file  . Stress: Not on file  Relationships  . Social connections:    Talks on phone: Not on file    Gets together: Not on file    Attends religious service: Not on file    Active member of club or organization: Not on file    Attends meetings of clubs or organizations: Not on file    Relationship status: Not on file  . Intimate  partner violence:    Fear of current or ex partner: Not on file    Emotionally abused: Not on file    Physically abused: Not on file    Forced sexual activity: Not on file  Other Topics Concern  . Not on file  Social History Narrative  . Not on file    Physical Exam  Pulmonary/Chest: Effort normal. No respiratory distress.  Musculoskeletal: He exhibits no edema.  Skin: Skin is warm and dry. He is not diaphoretic.        Future Appointments  Date Time Provider [place]  03/13/2018  3:00 PM Thompson Grayer, MD [facility]  03/14/2018 10:40 AM [name], Legrand Como, MD [facility] None  03/20/2018 10:20 AM [name], MD [facility] None     BP 120/60 (BP Location: Right Arm, Patient Position: Sitting, Cuff Size: Normal)   Pulse 96   Resp 16   Wt 147 lb 12.8 oz (67 kg)   SpO2 97%   BMI 21.21 kg/m   Weight yesterday-didn't weigh Last visit weight-145  ATF pt CAO x4 sitting on the porch smoking a cigarette.  Today's visit is to verify his pill packs.  He didn't take his morning meds today because he had errands to run but he took them during our visit.  He denies sob, chest pain and dizziness. He has wheezing noted in both lower lung fields.  He stated that he has used the inhaler today as prescribed but not the resue inhaler.  Pt has a productive cough.  He plans on retrying the nicotine patches 02/04/18; he still have some from earlier this summer.  Pill packs verified and vitals taken.   Medication ordered : automatic refill from [place], EMT Paramedic [phone] 03/01/2018    ACTION: Home visit completed

## 2018-03-13 ENCOUNTER — Ambulatory Visit: Payer: Self-pay | Admitting: Internal Medicine

## 2018-03-14 ENCOUNTER — Ambulatory Visit (INDEPENDENT_AMBULATORY_CARE_PROVIDER_SITE_OTHER): Payer: Medicare Other | Admitting: Family Medicine

## 2018-03-14 ENCOUNTER — Ambulatory Visit (INDEPENDENT_AMBULATORY_CARE_PROVIDER_SITE_OTHER): Payer: Self-pay | Admitting: Family Medicine

## 2018-03-14 ENCOUNTER — Encounter (INDEPENDENT_AMBULATORY_CARE_PROVIDER_SITE_OTHER): Payer: Self-pay | Admitting: Family Medicine

## 2018-03-14 ENCOUNTER — Ambulatory Visit (INDEPENDENT_AMBULATORY_CARE_PROVIDER_SITE_OTHER): Payer: Self-pay

## 2018-03-14 DIAGNOSIS — M7021 Olecranon bursitis, right elbow: Secondary | ICD-10-CM | POA: Diagnosis not present

## 2018-03-14 DIAGNOSIS — M25521 Pain in right elbow: Secondary | ICD-10-CM

## 2018-03-14 NOTE — Progress Notes (Signed)
  Gerald Nelson - 67 y.o. male MRN [id number]  Date of birth: 31-Oct-1950    SUBJECTIVE:      Chief Complaint:/ HPI:  67 year old male presents with right elbow swelling.  Swelling began approximately 3 months ago.  He denies any specific mechanism of injury.  He reports mild 1/10 discomfort but denies any real pain.  He denies any associated fevers or chills.  He denies any redness.  No drainage.  He denies any difficulty moving his elbow.  No pain with range of motion of the elbow or wrist.  He denies any numbness or tingling of the distal extremity.  He is never had any swelling like this previously   ROS:     See HPI  PERTINENT  PMH / PSH FH / / SH:  Past Medical, Surgical, Social, and Family History Reviewed & Updated in the EMR.  Pertinent findings include:  Patient has a complicated past medical history to Include CHF, severe COPD, pulmonary hypertension, and psychiatric disorders.  OBJECTIVE: There were no vitals taken for this visit.  Physical Exam:  Vital signs are reviewed.  GEN: Alert and oriented, NAD Pulm: Breathing unlabored PSY: normal mood, congruent affect  MSK: Right elbow: Inspection: Soft tissue swelling over the posterior aspect of the elbow from the olecranon to the proximal aspect of the forearm.  No overlying skin breakdown.  No erythema.  No.  The area in question appears to be septated however with palpation the 2 areas communicate Palpation: No tenderness to palpation.  No increased warmth.  The area of swelling is not tense ROM: The right elbow demonstrates approximately 5 to 10 degrees less extension compared to the` left.  Otherwise range of motion of the upper extremities normal. Strength: 5/5 strength Neurovascular: N/V intact  Left elbow: No gross deformity.  No swelling, erythema, bruising No tenderness to palpation. Full range of motion with 5/5 strength N/V intact     ASSESSMENT & PLAN:  1.  Olecranon bursitis of the right elbow- x-rays of  the right elbow and AP and lateral views were obtained today and were independently reviewed.  No bony abnormality.  There is significant soft tissue swelling observed which corresponds with the patient's complaint.  Uncomplicated bursitis without pain or any evidence of infection.  Patient elected to have this drained today.  This was performed as described below - Patient will follow-up as needed if the bursitis/swelling returns   Procedure performed: Right olecranon bursa aspiration and corticosteroid injection; palpation guided  Consent obtained and verified. Time-out conducted. Noted no overlying erythema, induration, or other signs of local infection. The right olecranon bursa was palpated and the overlying skin was prepped in a sterile fashion.  The chloride used in a wheal was made with lidocaine for anesthetic using a 25-gauge 1.5 inch needle.  Following this 18-gauge 1.5 inch needle was inserted into the bursa.  60 cc of blood-tinged serous fluid was drained without difficulty.  40 cc Depo-Medrol was injected into the bursa.  Sterile dressing applied.  Elbow wrapped with Ace wrap for compression.  Patient tolerated well

## 2018-03-14 NOTE — Progress Notes (Signed)
I saw and evaluated the patient along with Dr. Okey Dupre.  I agree with his assessment and plan.  Patient was treated with elbow aspiration and injection.  He will use compressive wraps for a few days, and follow-up as needed.  Reassurance that this is a benign process.

## 2018-03-20 ENCOUNTER — Encounter (HOSPITAL_COMMUNITY): Payer: Self-pay | Admitting: Cardiology

## 2018-03-25 ENCOUNTER — Other Ambulatory Visit: Payer: Self-pay | Admitting: Physician Assistant

## 2018-04-26 ENCOUNTER — Ambulatory Visit: Payer: Self-pay | Admitting: Internal Medicine

## 2018-04-26 ENCOUNTER — Telehealth (HOSPITAL_COMMUNITY): Payer: Self-pay

## 2018-04-26 NOTE — Telephone Encounter (Signed)
Pr returned call from yesterday to schedule a CHP visit. He agrees to meet next Tuesday due to him having multiple job interviews.

## 2018-05-03 ENCOUNTER — Other Ambulatory Visit (HOSPITAL_COMMUNITY): Payer: Self-pay

## 2018-05-03 NOTE — Progress Notes (Signed)
Paramedicine Encounter    Patient ID: Gerald Nelson, male    DOB: 1951/03/31, 67 y.o.   MRN: [id number]    Patient Care Team: [name], MD as PCP - General (Internal Medicine)  Patient Active Problem List   Diagnosis Date Noted  . Noncompliance 04/23/2017  . Mitral regurgitation 04/23/2017  . Thrombus - possible apical thrombus 01/2017 04/23/2017  . Hyperlipidemia 04/23/2017  . Pulmonary hypertension ([place])   . SOB (shortness of breath)   . Palliative care by specialist   . Tobacco abuse 04/19/2017  . Acute respiratory failure with hypoxia ([place]) 01/27/2017  . Hypertensive urgency 01/27/2017  . CHF (congestive heart failure) ([place]) 01/26/2017  . CKD (chronic kidney disease), stage III ([place]) 01/04/2017  . Essential hypertension 01/04/2017  . CVA (cerebral infarction) 12/12/2013  . Hyponatremia 12/06/2012  . Hiatal hernia 12/06/2012  . Syncope 12/06/2012  . Schizophrenia ([place])     Current Outpatient Medications:  .  albuterol (PROVENTIL HFA;VENTOLIN HFA) 108 (90 Base) MCG/ACT inhaler, Inhale 1-2 puffs into the lungs every 6 (six) hours as needed for wheezing or shortness of breath., Disp: , Rfl:  .  aspirin 81 MG EC tablet, TAKE 1 TABLET BY MOUTH EVERY DAY, Disp: 30 tablet, Rfl: 1 .  atorvastatin (LIPITOR) 40 MG tablet, TAKE ONE TABLET BY MOUTH EVERY DAY IN THE EVENING, Disp: 30 tablet, Rfl: 6 .  bisoprolol (ZEBETA) 5 MG tablet, Take 1 tablet (5 mg total) by mouth daily., Disp: 90 tablet, Rfl: 3 .  budesonide-formoterol (SYMBICORT) 160-4.5 MCG/ACT inhaler, Inhale 2 puffs into the lungs 2 (two) times daily., Disp: 1 Inhaler, Rfl: 6 .  furosemide (LASIX) 40 MG tablet, Take 1 tablet (40 mg total) by mouth 2 (two) times daily., Disp: 60 tablet, Rfl: 6 .  haloperidol decanoate (HALDOL DECANOATE) 100 MG/ML injection, Inject 100 mg into the muscle every 30 (thirty) days. , Disp: , Rfl: 99 .  isosorbide-hydrALAZINE (BIDIL) 20-37.5 MG tablet, TAKE 2 TABLETS BY MOUTH 3 (THREE) TIMES  DAILY., Disp: 180 tablet, Rfl: 6 .  losartan (COZAAR) 25 MG tablet, Take 1 tablet (25 mg total) by mouth daily., Disp: 90 tablet, Rfl: 3 .  nicotine (NICODERM CQ - DOSED IN MG/24 HOURS) 14 mg/24hr patch, Place 1 patch (14 mg total) onto the skin daily., Disp: 28 patch, Rfl: 0 .  potassium chloride SA (K-DUR,KLOR-CON) 20 MEQ tablet, TAKE ONE TABLET BY MOUTH ONCE DAILY (AM), Disp: 30 tablet, Rfl: 6 .  spironolactone (ALDACTONE) 25 MG tablet, TAKE ONE TABLET BY MOUTH ONCE DAILY (BEDTIME), Disp: 30 tablet, Rfl: 5 .  Tiotropium Bromide Monohydrate (SPIRIVA RESPIMAT) 2.5 MCG/ACT AERS, Inhale 1 spray into the lungs daily., Disp: 1 Inhaler, Rfl: 5 Allergies  Allergen Reactions  . Sulfa Antibiotics Nausea Only     Social History   Socioeconomic History  . Marital status: Single    Spouse name: Not on file  . Number of children: Not on file  . Years of education: Not on file  . Highest education level: Not on file  Occupational History  . Not on file  Social Needs  . Financial resource strain: Not on file  . Food insecurity:    Worry: Not on file    Inability: Not on file  . Transportation needs:    Medical: Not on file    Non-medical: Not on file  Tobacco Use  . Smoking status: Current Every Day Smoker    Packs/day: 1.50    Years: 44.00    Pack years:  66.00    Types: Cigarettes    Start date: 30  . Smokeless tobacco: Never Used  . Tobacco comment: 1ppd as of 12/06/17 ep  Substance and Sexual Activity  . Alcohol use: No  . Drug use: No  . Sexual activity: Not on file  Lifestyle  . Physical activity:    Days per week: Not on file    Minutes per session: Not on file  . Stress: Not on file  Relationships  . Social connections:    Talks on phone: Not on file    Gets together: Not on file    Attends religious service: Not on file    Active member of club or organization: Not on file    Attends meetings of clubs or organizations: Not on file    Relationship status: Not on file   . Intimate partner violence:    Fear of current or ex partner: Not on file    Emotionally abused: Not on file    Physically abused: Not on file    Forced sexual activity: Not on file  Other Topics Concern  . Not on file  Social History Narrative  . Not on file    Physical Exam  Pulmonary/Chest: Effort normal. No respiratory distress. He has no wheezes. He has no rales.  Abdominal: Soft.  Musculoskeletal: He exhibits no edema.  Skin: Skin is warm and dry. He is not diaphoretic.        Future Appointments  Date Time Provider [place]  05/09/2018  9:00 AM [name], MD [facility] None  06/05/2018  9:00 AM Brand Males, MD [facility] None     BP 136/88 (BP Location: Left Arm, Patient Position: Standing, Cuff Size: Normal)   Resp 20   Wt 138 lb (62.6 kg)   SpO2 93%   BMI 19.80 kg/m   Weight yesterday- didn't weigh Last visit weight-147 (03/01/18)  ATF pt CAO x 4 standing in the living room w/no complaints.  Pt has a job now therefore we couldn't visit until today. 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  He was unable to visit last month due to job training. Pt is seen monthly to check for well fair check and to check his medication bubble packs.  He showed me a red pill inside of the pack which he was confused and didn't know if he should take it.  Pt advised that the manufacture of a current medication may have changed. The pill bubble packs taken back to [place] to verify. I couldn't find the medication online.  Pt denies sob, chest pain and dizziness.  Pt stated that he's not weighing himself daily but his weight hasn't changed when he did check it.  Pt has made all of the appointments to his psychiatrist and pcp. He has received haldol shot for this month.      Medication ordered: pt uses pill bubble packs  Rodger Giangregorio, EMT Paramedic [phone] 05/03/2018    ACTION: Home visit  completed

## 2018-05-04 ENCOUNTER — Other Ambulatory Visit (HOSPITAL_COMMUNITY): Payer: Self-pay

## 2018-05-04 NOTE — Progress Notes (Signed)
Pt's bubble pill packs given back to him this am. I took them yesterday to confirm with the pharmacist that the new pill in the pack was bisoprolol.  The pharmacist stated that the company changed manufactors and the medication is the same.    Pt notified of the same.  Next visit 2nd week in Nov.

## 2018-05-09 ENCOUNTER — Ambulatory Visit (HOSPITAL_COMMUNITY)
Admission: RE | Admit: 2018-05-09 | Discharge: 2018-05-09 | Disposition: A | Discharge status: Home / Self Care | Payer: Medicare Other | Source: Ambulatory Visit | Attending: Cardiology | Admitting: Cardiology

## 2018-05-09 ENCOUNTER — Other Ambulatory Visit (HOSPITAL_COMMUNITY): Payer: Self-pay

## 2018-05-09 ENCOUNTER — Encounter (HOSPITAL_COMMUNITY): Payer: Self-pay | Admitting: Cardiology

## 2018-05-09 VITALS — BP 104/71 | HR 68 | Wt 136.8 lb

## 2018-05-09 DIAGNOSIS — I130 Hypertensive heart and chronic kidney disease with heart failure and stage 1 through stage 4 chronic kidney disease, or unspecified chronic kidney disease: Secondary | ICD-10-CM | POA: Insufficient documentation

## 2018-05-09 DIAGNOSIS — I5022 Chronic systolic (congestive) heart failure: Secondary | ICD-10-CM

## 2018-05-09 DIAGNOSIS — Z7982 Long term (current) use of aspirin: Secondary | ICD-10-CM | POA: Diagnosis not present

## 2018-05-09 DIAGNOSIS — Z8673 Personal history of transient ischemic attack (TIA), and cerebral infarction without residual deficits: Secondary | ICD-10-CM | POA: Insufficient documentation

## 2018-05-09 DIAGNOSIS — I340 Nonrheumatic mitral (valve) insufficiency: Secondary | ICD-10-CM | POA: Insufficient documentation

## 2018-05-09 DIAGNOSIS — N183 Chronic kidney disease, stage 3 unspecified: Secondary | ICD-10-CM

## 2018-05-09 DIAGNOSIS — F209 Schizophrenia, unspecified: Secondary | ICD-10-CM | POA: Insufficient documentation

## 2018-05-09 DIAGNOSIS — E785 Hyperlipidemia, unspecified: Secondary | ICD-10-CM

## 2018-05-09 DIAGNOSIS — Z882 Allergy status to sulfonamides status: Secondary | ICD-10-CM | POA: Diagnosis not present

## 2018-05-09 DIAGNOSIS — I2510 Atherosclerotic heart disease of native coronary artery without angina pectoris: Secondary | ICD-10-CM | POA: Insufficient documentation

## 2018-05-09 DIAGNOSIS — Z7951 Long term (current) use of inhaled steroids: Secondary | ICD-10-CM | POA: Diagnosis not present

## 2018-05-09 DIAGNOSIS — Z8249 Family history of ischemic heart disease and other diseases of the circulatory system: Secondary | ICD-10-CM | POA: Diagnosis not present

## 2018-05-09 DIAGNOSIS — Z79899 Other long term (current) drug therapy: Secondary | ICD-10-CM | POA: Insufficient documentation

## 2018-05-09 DIAGNOSIS — Z720 Tobacco use: Secondary | ICD-10-CM

## 2018-05-09 DIAGNOSIS — I429 Cardiomyopathy, unspecified: Secondary | ICD-10-CM | POA: Insufficient documentation

## 2018-05-09 DIAGNOSIS — J449 Chronic obstructive pulmonary disease, unspecified: Secondary | ICD-10-CM | POA: Diagnosis not present

## 2018-05-09 DIAGNOSIS — F17210 Nicotine dependence, cigarettes, uncomplicated: Secondary | ICD-10-CM | POA: Diagnosis not present

## 2018-05-09 LAB — COMPREHENSIVE METABOLIC PANEL
ALBUMIN: 3.8 g/dL (ref 3.5–5.0)
ALK PHOS: 67 U/L (ref 38–126)
ALT: 22 U/L (ref 0–44)
AST: 31 U/L (ref 15–41)
Anion gap: 9 (ref 5–15)
BILIRUBIN TOTAL: 0.8 mg/dL (ref 0.3–1.2)
BUN: 34 mg/dL — ABNORMAL HIGH (ref 8–23)
CALCIUM: 9.6 mg/dL (ref 8.9–10.3)
CO2: 25 mmol/L (ref 22–32)
CREATININE: 1.95 mg/dL — AB (ref 0.61–1.24)
Chloride: 105 mmol/L (ref 98–111)
GFR, EST AFRICAN AMERICAN: 40 mL/min — AB (ref >60)
GFR, EST NON AFRICAN AMERICAN: 34 mL/min — AB (ref >60)
Glucose, Bld: 149 mg/dL — ABNORMAL HIGH (ref 70–99)
Potassium: 4.1 mmol/L (ref 3.5–5.1)
SODIUM: 139 mmol/L (ref 135–145)
TOTAL PROTEIN: 7.3 g/dL (ref 6.5–8.1)

## 2018-05-09 LAB — LIPID PANEL
Cholesterol: 144 mg/dL (ref 0–200)
HDL: 64 mg/dL (ref >40)
LDL CALC: 54 mg/dL (ref 0–99)
TRIGLYCERIDES: 132 mg/dL (ref <150)
Total CHOL/HDL Ratio: 2.3 RATIO
VLDL: 26 mg/dL (ref 0–40)

## 2018-05-09 MED ORDER — FUROSEMIDE 40 MG PO TABS: 40.0000 mg | ORAL_TABLET | Freq: Every day | ORAL | 6 refills | Status: DC

## 2018-05-09 MED ORDER — BISOPROLOL FUMARATE 5 MG PO TABS: 7.5000 mg | ORAL_TABLET | Freq: Every day | ORAL | 3 refills | Status: DC

## 2018-05-09 NOTE — Progress Notes (Signed)
Paramedicine Encounter   Patient ID: Gerald Nelson , male,   DOB: 05/07/1951,67 y.o.,  MRN: [id number]   Met patient in clinic today with provider Dr. Aundra Dubin.   Pt has already seen Dr. Aundra Dubin and was waiting for discharge instructions upon my arrival.  I was given pt's discharge instructions which included med changes.  Pt agrees to visit in the morning for pill box revision because he has work today as soon as he leaves here.  Pt receives his meds from [facility]; Ill go to the pharmacy and pick up the new rx today.  Time spent with patient 15 mins   [place], EMT-Paramedic [phone] 05/09/2018   ACTION: Home visit completed

## 2018-05-09 NOTE — Patient Instructions (Signed)
Labs done today  INCREASE bisoprolol to 7.5mg  (1.5 tabs) daily  DECREASE furosemide to 40 mg (1 tab) daily. If weight goes up 2-3 lbs in a day or 3-4 lbs in a week, INCREASE furosemide to 40 mg (1 tab) AND 20 mg (0.5 tab) at night  Follow up with Dr. Aundra Dubin in 1 month

## 2018-05-09 NOTE — Progress Notes (Signed)
Advanced Heart Failure Clinic Note    Primary Care: [name]   Cardiology: Dr. Aundra Dubin   HPI: Gerald Nelson is a 67 y.o. male with a past medical history of chronic systolic CHF (EF 16% in July 2018), severe MR/TR, pulmonary HTN, CVA, CKD, schizophrenia, HTN and tobacco abuse.   Gerald Nelson was admitted to [facility] 7/2-01/06/17 for newly diagnosed acute systolic CHF. Echo showed LVEF 15%, severe global hypokinesis, moderate LVH, coarsetrabeculation of the LV apex with numerous false tendinae of the left ventricle, very stagnant blood flow at the LV apex withsmoke but no obvious LV thrombus - Definity contrast was given,again, noting stagnant apical blood flow - this could suggestrecent thrombus and certainly high risk for apical thrombusformation. Other findings include aortic sclerosis with mild AI,moderate to severe MR, moderate LAE, mild RAE, moderate to severeTR, moderate to severe pulmonary hypertension (RVSP 73 mmHg), dilated IVC, trivial posterior pericardial effusion.   He was admitted again in 10/18. He diuresed 25 pounds on IV lasix. He refused cath. His clonidine and diltazem were stopped during this admission. Referred to paramedicine. Discharge weight 133 pounds.   Echo 08/11/17 LVEF 15-20%, no MR noted.   Patient presents for followup of CHF.  He continues to smoke 1.5 ppd.  He is working at [employer].  No dyspnea with his job.  Dyspnea only with heavy exertion.  No orthopnea/PND.  No chest pain.  Weight is down 12 lbs.  No lightheadedness.  Paramedicine is following.   Labs (6/19): K 3.8, creatinine 2.2  ECG (3/19, personally reviewed): NSR, LVH  Review of systems complete and found to be negative unless listed in HPI.   PMH: 1. Chronic systolic CHF:  Cardiomyopathy of uncertain etiology, refused cath. He also refused ICD.  - Echo (7/18): Moderate LVH, severe FBSH, trabeculation at apex not meeting criteria for noncompaction, EF 15%, moderate to severe MR, low normal RV  systolic function, moderate-severe TR.  - Echo (2/19): Moderate LVH, EF 15-20%, No mitral regurgitation, normal RV size and systolic function, mild TR.  2. H/o CVA 3. Schizophrenia 4. CKD stage 3 5. Mitral regurgitation: Suspect functional. Moderate-severe MR on 7/18 echo, minimal MR on 2/19 echo.  6. COPD: He is an active smoker.  - PFTs (12/18) suggestive of severe COPD.  7. H/o HTN 8. ? apical mural thrombus: Not seen on most recent echo in 2/19.  9. CAD: 1/19 CT chest showed coronary calcification.    Current Outpatient Medications  Medication Sig Dispense Refill  . albuterol (PROVENTIL HFA;VENTOLIN HFA) 108 (90 Base) MCG/ACT inhaler Inhale 1-2 puffs into the lungs every 6 (six) hours as needed for wheezing or shortness of breath.    Marland Kitchen aspirin 81 MG EC tablet TAKE 1 TABLET BY MOUTH EVERY DAY 30 tablet 1  . atorvastatin (LIPITOR) 40 MG tablet TAKE ONE TABLET BY MOUTH EVERY DAY IN THE EVENING 30 tablet 6  . bisoprolol (ZEBETA) 5 MG tablet Take 1.5 tablets (7.5 mg total) by mouth daily. 90 tablet 3  . budesonide-formoterol (SYMBICORT) 160-4.5 MCG/ACT inhaler Inhale 2 puffs into the lungs 2 (two) times daily. 1 Inhaler 6  . furosemide (LASIX) 40 MG tablet Take 1 tablet (40 mg total) by mouth daily. If weight goes up 2-3lbs in a day or 3-4lbs in a week, increase to 40mg  in AM AND 20mg  in the PM 60 tablet 6  . isosorbide-hydrALAZINE (BIDIL) 20-37.5 MG tablet TAKE 2 TABLETS BY MOUTH 3 (THREE) TIMES DAILY. 180 tablet 6  . potassium chloride  SA (K-DUR,KLOR-CON) 20 MEQ tablet TAKE ONE TABLET BY MOUTH ONCE DAILY (AM) 30 tablet 6  . spironolactone (ALDACTONE) 25 MG tablet TAKE ONE TABLET BY MOUTH ONCE DAILY (BEDTIME) 30 tablet 5  . Tiotropium Bromide Monohydrate (SPIRIVA RESPIMAT) 2.5 MCG/ACT AERS Inhale 1 spray into the lungs daily. 1 Inhaler 5  . losartan (COZAAR) 25 MG tablet Take 1 tablet (25 mg total) by mouth daily. 90 tablet 3   No current facility-administered medications for this  encounter.    Allergies  Allergen Reactions  . Sulfa Antibiotics Nausea Only   Social History   Socioeconomic History  . Marital status: Single    Spouse name: Not on file  . Number of children: Not on file  . Years of education: Not on file  . Highest education level: Not on file  Occupational History  . Not on file  Social Needs  . Financial resource strain: Not on file  . Food insecurity:    Worry: Not on file    Inability: Not on file  . Transportation needs:    Medical: Not on file    Non-medical: Not on file  Tobacco Use  . Smoking status: Current Every Day Smoker    Packs/day: 1.50    Years: 44.00    Pack years: 66.00    Types: Cigarettes    Start date: 84  . Smokeless tobacco: Never Used  . Tobacco comment: 1ppd as of 12/06/17 ep  Substance and Sexual Activity  . Alcohol use: No  . Drug use: No  . Sexual activity: Not on file  Lifestyle  . Physical activity:    Days per week: Not on file    Minutes per session: Not on file  . Stress: Not on file  Relationships  . Social connections:    Talks on phone: Not on file    Gets together: Not on file    Attends religious service: Not on file    Active member of club or organization: Not on file    Attends meetings of clubs or organizations: Not on file    Relationship status: Not on file  . Intimate partner violence:    Fear of current or ex partner: Not on file    Emotionally abused: Not on file    Physically abused: Not on file    Forced sexual activity: Not on file  Other Topics Concern  . Not on file  Social History Narrative  . Not on file      Family History  Problem Relation Age of Onset  . Hypertension Mother   . Hypertension Father    Vitals:   05/09/18 0833  BP: 104/71  Pulse: 68  Weight: 62.1 kg (136 lb 12.8 oz)   Wt Readings from Last 3 Encounters:  05/09/18 62.1 kg (136 lb 12.8 oz)  05/03/18 62.6 kg (138 lb)  03/01/18 67 kg (147 lb 12.8 oz)   PHYSICAL EXAM: General:  NAD Neck: No JVD, no thyromegaly or thyroid nodule.  Lungs: Distant BS CV: Nondisplaced PMI.  Heart regular S1/S2, no S3/S4, no murmur.  No peripheral edema.  No carotid bruit.  Normal pedal pulses.  Abdomen: Soft, nontender, no hepatosplenomegaly, no distention.  Skin: Intact without lesions or rashes.  Neurologic: Alert and oriented x 3.  Psych: Normal affect. Extremities: No clubbing or cyanosis.  HEENT: Normal.   ASSESSMENT & PLAN: 1. Chronic systolic CHF: Echo 0/1093 EF 15% with moderate-severe MR. Echo 08/11/17 LVEF 15-20%, no MR noted.  Cardiomyopathy  of uncertain etiology, he has refused cath in the past.  NYHA class II symptoms.  Not volume overloaded on exam.  He smokes and has significant coronary calcification by CT chest, so I am concerned that he has an ischemic cardiomyopathy.  Last creatinine was 2.2 in 6/19. Weight is down.  - We discussed left/right heart cath today for assessment for CAD.  He is now willing to have cath done, but I want to make sure that creatinine is acceptable for cath.  BMET today, will call him and set up cath if creatinine < 2.  - I think that he can decrease Lasix to 40 mg once daily. He can take an extra afternoon dose of lasix prn weight gain.  - Increase bisoprolol to 7.5 mg daily.   - Continue losartan 25 mg daily.  - Continue spironolactone 25 mg daily.  - He saw EP earlier this year and refused ICD.  He is not a candidate for CRT.  2. Questionable apical thrombus: Not seen on most recent echo in 2/19. He is not anticoagulated.  3. Mitral regurgitation: Moderate to severe on 7/18 echo but minimal on 2/19 echo.  Suspect functional MR.  No significant murmur on exam today.  4. COPD: He continues to smoke.  Severe COPD by prior PFTs.  - He is working on quitting, says that pulmonary will give him a medication if he has not quit by his next appt with them.  5. CKD stage III: Repeat BMET today. Depending on creatinine, will decide on cath.  6.  Schizophrenia: Affects compliance with medical care.  Continue paramedicine, this seems to be working well.  7. CAD: Noted on CT chest.  Concern for ischemic cardiomyopathy as above.  - Continue ASA 81.  - Continue atorvastatin, will get lipids today.  - Cath as above if creatinine is < 2 today.   Followup in 1 month.   Loralie Champagne 05/09/2018

## 2018-05-10 ENCOUNTER — Telehealth (HOSPITAL_COMMUNITY): Payer: Self-pay

## 2018-05-10 ENCOUNTER — Other Ambulatory Visit (HOSPITAL_COMMUNITY): Payer: Self-pay

## 2018-05-10 NOTE — Telephone Encounter (Signed)
Pt called to confirm today's appointment

## 2018-05-10 NOTE — Progress Notes (Signed)
Paramedicine Encounter    Patient ID: Gerald Nelson, male    DOB: 12/31/1950, 67 y.o.   MRN: [id number]    Patient Care Team: [name], MD as PCP - General (Internal Medicine)  Patient Active Problem List   Diagnosis Date Noted  . Noncompliance 04/23/2017  . Mitral regurgitation 04/23/2017  . Thrombus - possible apical thrombus 01/2017 04/23/2017  . Hyperlipidemia 04/23/2017  . Pulmonary hypertension ([place])   . SOB (shortness of breath)   . Palliative care by specialist   . Tobacco abuse 04/19/2017  . Acute respiratory failure with hypoxia ([place]) 01/27/2017  . Hypertensive urgency 01/27/2017  . CHF (congestive heart failure) ([place]) 01/26/2017  . CKD (chronic kidney disease), stage III ([place]) 01/04/2017  . Essential hypertension 01/04/2017  . CVA (cerebral infarction) 12/12/2013  . Hyponatremia 12/06/2012  . Hiatal hernia 12/06/2012  . Syncope 12/06/2012  . Schizophrenia ([place])     Current Outpatient Medications:  .  aspirin 81 MG EC tablet, TAKE 1 TABLET BY MOUTH EVERY DAY, Disp: 30 tablet, Rfl: 1 .  atorvastatin (LIPITOR) 40 MG tablet, TAKE ONE TABLET BY MOUTH EVERY DAY IN THE EVENING, Disp: 30 tablet, Rfl: 6 .  budesonide-formoterol (SYMBICORT) 160-4.5 MCG/ACT inhaler, Inhale 2 puffs into the lungs 2 (two) times daily., Disp: 1 Inhaler, Rfl: 6 .  furosemide (LASIX) 40 MG tablet, Take 1 tablet (40 mg total) by mouth daily. If weight goes up 2-3lbs in a day or 3-4lbs in a week, increase to 40mg  in AM AND 20mg  in the PM, Disp: 60 tablet, Rfl: 6 .  isosorbide-hydrALAZINE (BIDIL) 20-37.5 MG tablet, TAKE 2 TABLETS BY MOUTH 3 (THREE) TIMES DAILY., Disp: 180 tablet, Rfl: 6 .  potassium chloride SA (K-DUR,KLOR-CON) 20 MEQ tablet, TAKE ONE TABLET BY MOUTH ONCE DAILY (AM), Disp: 30 tablet, Rfl: 6 .  spironolactone (ALDACTONE) 25 MG tablet, TAKE ONE TABLET BY MOUTH ONCE DAILY (BEDTIME), Disp: 30 tablet, Rfl: 5 .  albuterol (PROVENTIL HFA;VENTOLIN HFA) 108 (90 Base) MCG/ACT inhaler, Inhale  1-2 puffs into the lungs every 6 (six) hours as needed for wheezing or shortness of breath., Disp: , Rfl:  .  bisoprolol (ZEBETA) 5 MG tablet, Take 1.5 tablets (7.5 mg total) by mouth daily., Disp: 90 tablet, Rfl: 3 .  losartan (COZAAR) 25 MG tablet, Take 1 tablet (25 mg total) by mouth daily., Disp: 90 tablet, Rfl: 3 .  Tiotropium Bromide Monohydrate (SPIRIVA RESPIMAT) 2.5 MCG/ACT AERS, Inhale 1 spray into the lungs daily., Disp: 1 Inhaler, Rfl: 5 Allergies  Allergen Reactions  . Sulfa Antibiotics Nausea Only     Social History   Socioeconomic History  . Marital status: Single    Spouse name: Not on file  . Number of children: Not on file  . Years of education: Not on file  . Highest education level: Not on file  Occupational History  . Not on file  Social Needs  . Financial resource strain: Not on file  . Food insecurity:    Worry: Not on file    Inability: Not on file  . Transportation needs:    Medical: Not on file    Non-medical: Not on file  Tobacco Use  . Smoking status: Current Every Day Smoker    Packs/day: 1.50    Years: 44.00    Pack years: 66.00    Types: Cigarettes    Start date: 20  . Smokeless tobacco: Never Used  . Tobacco comment: 1ppd as of 12/06/17 ep  Substance and Sexual Activity  .  Alcohol use: No  . Drug use: No  . Sexual activity: Not on file  Lifestyle  . Physical activity:    Days per week: Not on file    Minutes per session: Not on file  . Stress: Not on file  Relationships  . Social connections:    Talks on phone: Not on file    Gets together: Not on file    Attends religious service: Not on file    Active member of club or organization: Not on file    Attends meetings of clubs or organizations: Not on file    Relationship status: Not on file  . Intimate partner violence:    Fear of current or ex partner: Not on file    Emotionally abused: Not on file    Physically abused: Not on file    Forced sexual activity: Not on file  Other  Topics Concern  . Not on file  Social History Narrative  . Not on file    Physical Exam  Pulmonary/Chest: Effort normal. He has wheezes.  Upper right lobe  Abdominal: Soft.  Musculoskeletal: He exhibits no edema.  Skin: Skin is warm and dry. He is not diaphoretic.        Future Appointments  Date Time Provider [place]  06/05/2018  9:00 AM Brand Males, MD [facility] None  06/15/2018 10:20 AM [name], MD [facility] None     BP 130/78 (BP Location: Right Arm, Patient Position: Standing, Cuff Size: Normal)   Pulse 84   Wt 139 lb 12.8 oz (63.4 kg)   SpO2 97%   BMI 20.06 kg/m   Weight yesterday-136 during clinic visit Last visit weight-138 (10/30)  ATF pt CAO x4 standing in the living room with no complaints.  Pt's meds were changed during yesterdays heart failure visit. He stated that he didn't take his evening meds last night because it had furosemide. His pill box was done manually. Pt denies sob, chest pain and dizziness.  Pt understands the changes but is unclear how to take the meds I his weight does increase.  I advised pt to call the heart failure clinic or myself if he has a weight gain.   Pt has wheezes noted in his upper right lung field. He hasn't used his inhaler yet; pt advised to do so.     CHP visits increased to once a week until the follow up appt in a month.   Medication ordered: none  Talley Casco, EMT Paramedic [phone] 05/10/2018    ACTION: Home visit completed

## 2018-05-17 ENCOUNTER — Telehealth (HOSPITAL_COMMUNITY): Payer: Self-pay

## 2018-05-17 ENCOUNTER — Other Ambulatory Visit (HOSPITAL_COMMUNITY): Payer: Self-pay

## 2018-05-17 NOTE — Telephone Encounter (Signed)
Pt called to confirm this am CHP visit

## 2018-05-17 NOTE — Progress Notes (Signed)
Paramedicine Encounter    Patient ID: Gerald Nelson, male    DOB: 01-Sep-1950, 67 y.o.   MRN: [id number]    Patient Care Team: [name], MD as PCP - General (Internal Medicine)  Patient Active Problem List   Diagnosis Date Noted  . Noncompliance 04/23/2017  . Mitral regurgitation 04/23/2017  . Thrombus - possible apical thrombus 01/2017 04/23/2017  . Hyperlipidemia 04/23/2017  . Pulmonary hypertension ([place])   . SOB (shortness of breath)   . Palliative care by specialist   . Tobacco abuse 04/19/2017  . Acute respiratory failure with hypoxia ([place]) 01/27/2017  . Hypertensive urgency 01/27/2017  . CHF (congestive heart failure) ([place]) 01/26/2017  . CKD (chronic kidney disease), stage III ([place]) 01/04/2017  . Essential hypertension 01/04/2017  . CVA (cerebral infarction) 12/12/2013  . Hyponatremia 12/06/2012  . Hiatal hernia 12/06/2012  . Syncope 12/06/2012  . Schizophrenia ([place])     Current Outpatient Medications:  .  albuterol (PROVENTIL HFA;VENTOLIN HFA) 108 (90 Base) MCG/ACT inhaler, Inhale 1-2 puffs into the lungs every 6 (six) hours as needed for wheezing or shortness of breath., Disp: , Rfl:  .  aspirin 81 MG EC tablet, TAKE 1 TABLET BY MOUTH EVERY DAY, Disp: 30 tablet, Rfl: 1 .  atorvastatin (LIPITOR) 40 MG tablet, TAKE ONE TABLET BY MOUTH EVERY DAY IN THE EVENING, Disp: 30 tablet, Rfl: 6 .  bisoprolol (ZEBETA) 5 MG tablet, Take 1.5 tablets (7.5 mg total) by mouth daily., Disp: 90 tablet, Rfl: 3 .  budesonide-formoterol (SYMBICORT) 160-4.5 MCG/ACT inhaler, Inhale 2 puffs into the lungs 2 (two) times daily., Disp: 1 Inhaler, Rfl: 6 .  furosemide (LASIX) 40 MG tablet, Take 1 tablet (40 mg total) by mouth daily. If weight goes up 2-3lbs in a day or 3-4lbs in a week, increase to 40mg  in AM AND 20mg  in the PM, Disp: 60 tablet, Rfl: 6 .  isosorbide-hydrALAZINE (BIDIL) 20-37.5 MG tablet, TAKE 2 TABLETS BY MOUTH 3 (THREE) TIMES DAILY., Disp: 180 tablet, Rfl: 6 .  losartan (COZAAR)  25 MG tablet, Take 1 tablet (25 mg total) by mouth daily., Disp: 90 tablet, Rfl: 3 .  potassium chloride SA (K-DUR,KLOR-CON) 20 MEQ tablet, TAKE ONE TABLET BY MOUTH ONCE DAILY (AM), Disp: 30 tablet, Rfl: 6 .  spironolactone (ALDACTONE) 25 MG tablet, TAKE ONE TABLET BY MOUTH ONCE DAILY (BEDTIME), Disp: 30 tablet, Rfl: 5 .  Tiotropium Bromide Monohydrate (SPIRIVA RESPIMAT) 2.5 MCG/ACT AERS, Inhale 1 spray into the lungs daily., Disp: 1 Inhaler, Rfl: 5 Allergies  Allergen Reactions  . Sulfa Antibiotics Nausea Only     Social History   Socioeconomic History  . Marital status: Single    Spouse name: Not on file  . Number of children: Not on file  . Years of education: Not on file  . Highest education level: Not on file  Occupational History  . Not on file  Social Needs  . Financial resource strain: Not on file  . Food insecurity:    Worry: Not on file    Inability: Not on file  . Transportation needs:    Medical: Not on file    Non-medical: Not on file  Tobacco Use  . Smoking status: Current Every Day Smoker    Packs/day: 1.50    Years: 44.00    Pack years: 66.00    Types: Cigarettes    Start date: 48  . Smokeless tobacco: Never Used  . Tobacco comment: 1ppd as of 12/06/17 ep  Substance and Sexual Activity  .  Alcohol use: No  . Drug use: No  . Sexual activity: Not on file  Lifestyle  . Physical activity:    Days per week: Not on file    Minutes per session: Not on file  . Stress: Not on file  Relationships  . Social connections:    Talks on phone: Not on file    Gets together: Not on file    Attends religious service: Not on file    Active member of club or organization: Not on file    Attends meetings of clubs or organizations: Not on file    Relationship status: Not on file  . Intimate partner violence:    Fear of current or ex partner: Not on file    Emotionally abused: Not on file    Physically abused: Not on file    Forced sexual activity: Not on file  Other  Topics Concern  . Not on file  Social History Narrative  . Not on file    Physical Exam  Pulmonary/Chest: Effort normal. No respiratory distress. He has no wheezes.  Abdominal: Soft. He exhibits no distension.  Musculoskeletal: He exhibits no edema.  Skin: Skin is warm and dry. He is not diaphoretic.        Future Appointments  Date Time Provider [place]  06/05/2018  9:00 AM Brand Males, MD [facility] None  06/15/2018 10:20 AM [name], MD [facility] None     BP (!) 144/90 (BP Location: Left Arm, Patient Position: Standing, Cuff Size: Normal)   Pulse 86   Resp 16   Wt 152 lb 3.2 oz (69 kg)   SpO2 93%   BMI 21.84 kg/m   Weight yesterday-didn't weigh Last visit weight-139  ATF pt CAO x4 getting ready for work with no complaints. Pt has taken all of his meds this week and hadn't a dose.  He took this am dose during this visit. Pt denies sob, chest pain and dizziness.  Pt hasn't increased the amount of pillows he use to sleep.  Pt is still smoking daily.  rx bottles verified and pill box refilled.    Pt later called and stated that he has a loss in appetite and he wanted to know if this is a side effect of the medications that he takes.    Medication ordered: ASA  Raffael Bugarin, EMT Paramedic [phone] 05/17/2018    ACTION: Home visit completed

## 2018-05-18 ENCOUNTER — Other Ambulatory Visit (HOSPITAL_COMMUNITY): Payer: Self-pay

## 2018-05-18 ENCOUNTER — Telehealth (HOSPITAL_COMMUNITY): Payer: Self-pay

## 2018-05-18 NOTE — Progress Notes (Signed)
CHP visit-medrec/ pt still out of several meds/ pharmacy called about the same.

## 2018-05-18 NOTE — Telephone Encounter (Signed)
Christy Sartorius from [facility] called and stated that it's still too soon to refill his meds.

## 2018-05-19 ENCOUNTER — Other Ambulatory Visit (HOSPITAL_COMMUNITY): Payer: Self-pay

## 2018-05-19 NOTE — Progress Notes (Signed)
Pt requested a chart with his medications on it for the weekend. Pt is now receiving his meds from the bottle due to recent medication change. Pt plans to pick up his meds tomorrow/ I left a chart with his room mate with the information that he requested.

## 2018-05-23 ENCOUNTER — Other Ambulatory Visit (HOSPITAL_COMMUNITY): Payer: Self-pay

## 2018-05-23 NOTE — Progress Notes (Signed)
Paramedicine Encounter    Patient ID: Gerald Nelson, male    DOB: 1950-09-13, 67 y.o.   MRN: [id number]    Patient Care Team: [name], MD as PCP - General (Internal Medicine)  Patient Active Problem List   Diagnosis Date Noted  . Noncompliance 04/23/2017  . Mitral regurgitation 04/23/2017  . Thrombus - possible apical thrombus 01/2017 04/23/2017  . Hyperlipidemia 04/23/2017  . Pulmonary hypertension ([place])   . SOB (shortness of breath)   . Palliative care by specialist   . Tobacco abuse 04/19/2017  . Acute respiratory failure with hypoxia ([place]) 01/27/2017  . Hypertensive urgency 01/27/2017  . CHF (congestive heart failure) ([place]) 01/26/2017  . CKD (chronic kidney disease), stage III ([place]) 01/04/2017  . Essential hypertension 01/04/2017  . CVA (cerebral infarction) 12/12/2013  . Hyponatremia 12/06/2012  . Hiatal hernia 12/06/2012  . Syncope 12/06/2012  . Schizophrenia ([place])     Current Outpatient Medications:  .  albuterol (PROVENTIL HFA;VENTOLIN HFA) 108 (90 Base) MCG/ACT inhaler, Inhale 1-2 puffs into the lungs every 6 (six) hours as needed for wheezing or shortness of breath., Disp: , Rfl:  .  aspirin 81 MG EC tablet, TAKE 1 TABLET BY MOUTH EVERY DAY, Disp: 30 tablet, Rfl: 1 .  atorvastatin (LIPITOR) 40 MG tablet, TAKE ONE TABLET BY MOUTH EVERY DAY IN THE EVENING, Disp: 30 tablet, Rfl: 6 .  bisoprolol (ZEBETA) 5 MG tablet, Take 1.5 tablets (7.5 mg total) by mouth daily., Disp: 90 tablet, Rfl: 3 .  budesonide-formoterol (SYMBICORT) 160-4.5 MCG/ACT inhaler, Inhale 2 puffs into the lungs 2 (two) times daily., Disp: 1 Inhaler, Rfl: 6 .  furosemide (LASIX) 40 MG tablet, Take 1 tablet (40 mg total) by mouth daily. If weight goes up 2-3lbs in a day or 3-4lbs in a week, increase to 40mg  in AM AND 20mg  in the PM, Disp: 60 tablet, Rfl: 6 .  isosorbide-hydrALAZINE (BIDIL) 20-37.5 MG tablet, TAKE 2 TABLETS BY MOUTH 3 (THREE) TIMES DAILY., Disp: 180 tablet, Rfl: 6 .  losartan (COZAAR)  25 MG tablet, Take 1 tablet (25 mg total) by mouth daily., Disp: 90 tablet, Rfl: 3 .  potassium chloride SA (K-DUR,KLOR-CON) 20 MEQ tablet, TAKE ONE TABLET BY MOUTH ONCE DAILY (AM), Disp: 30 tablet, Rfl: 6 .  spironolactone (ALDACTONE) 25 MG tablet, TAKE ONE TABLET BY MOUTH ONCE DAILY (BEDTIME), Disp: 30 tablet, Rfl: 5 .  Tiotropium Bromide Monohydrate (SPIRIVA RESPIMAT) 2.5 MCG/ACT AERS, Inhale 1 spray into the lungs daily., Disp: 1 Inhaler, Rfl: 5 Allergies  Allergen Reactions  . Sulfa Antibiotics Nausea Only     Social History   Socioeconomic History  . Marital status: Single    Spouse name: Not on file  . Number of children: Not on file  . Years of education: Not on file  . Highest education level: Not on file  Occupational History  . Not on file  Social Needs  . Financial resource strain: Not on file  . Food insecurity:    Worry: Not on file    Inability: Not on file  . Transportation needs:    Medical: Not on file    Non-medical: Not on file  Tobacco Use  . Smoking status: Current Every Day Smoker    Packs/day: 1.50    Years: 44.00    Pack years: 66.00    Types: Cigarettes    Start date: 63  . Smokeless tobacco: Never Used  . Tobacco comment: 1ppd as of 12/06/17 ep  Substance and Sexual Activity  .  Alcohol use: No  . Drug use: No  . Sexual activity: Not on file  Lifestyle  . Physical activity:    Days per week: Not on file    Minutes per session: Not on file  . Stress: Not on file  Relationships  . Social connections:    Talks on phone: Not on file    Gets together: Not on file    Attends religious service: Not on file    Active member of club or organization: Not on file    Attends meetings of clubs or organizations: Not on file    Relationship status: Not on file  . Intimate partner violence:    Fear of current or ex partner: Not on file    Emotionally abused: Not on file    Physically abused: Not on file    Forced sexual activity: Not on file  Other  Topics Concern  . Not on file  Social History Narrative  . Not on file    Physical Exam  Pulmonary/Chest: Effort normal. He has wheezes. He has no rales.  Abdominal: Soft. He exhibits no distension.  Musculoskeletal: He exhibits no edema.  Skin: Skin is warm and dry. He is not diaphoretic.        Future Appointments  Date Time Provider [place]  06/05/2018  9:00 AM Brand Males, MD [facility] None  06/15/2018 10:20 AM [name], MD [facility] None     BP 140/90 (BP Location: Left Arm, Patient Position: Standing, Cuff Size: Normal)   Pulse 87   Resp (!) 24   Wt 143 lb 3.2 oz (65 kg)   SpO2 97%   BMI 20.55 kg/m   Weight yesterday-143 Last visit weight-152 (pt stated that he weighed today with his clothes on)  ATF pt CAO x4 standing in the living room talking with RN from [facility], today is his annual visit.  Pt stated that he feels "fine". He hasn't taken his morning meds/ he took during this visit. Pt denies sob, although he's wheezing in his lower lung fields. Pt stated that he hasn't taken the breathing treatment this morning, which he was advised to do so.  Pt's vitals noted. rx bottles verified and pill box refilled.    Medication ordered: none  Silvia Markuson, EMT Paramedic [phone] 05/23/2018    ACTION: Home visit completed

## 2018-05-24 ENCOUNTER — Other Ambulatory Visit: Payer: Self-pay | Admitting: Internal Medicine

## 2018-05-24 ENCOUNTER — Ambulatory Visit
Admission: RE | Admit: 2018-05-24 | Discharge: 2018-05-24 | Disposition: A | Discharge status: Home / Self Care | Payer: Medicare Other | Source: Ambulatory Visit | Attending: Internal Medicine | Admitting: Internal Medicine

## 2018-05-24 DIAGNOSIS — R059 Cough, unspecified: Secondary | ICD-10-CM

## 2018-05-24 DIAGNOSIS — R05 Cough: Secondary | ICD-10-CM

## 2018-05-30 ENCOUNTER — Encounter (HOSPITAL_COMMUNITY): Payer: Self-pay

## 2018-05-30 ENCOUNTER — Other Ambulatory Visit (HOSPITAL_COMMUNITY): Payer: Self-pay

## 2018-05-30 NOTE — Progress Notes (Signed)
Paramedicine Encounter    Patient ID: Gerald Nelson, male    DOB: 07/21/1950, 67 y.o.   MRN: [id number]    Patient Care Team: [name], MD as PCP - General (Internal Medicine)  Patient Active Problem List   Diagnosis Date Noted  . Noncompliance 04/23/2017  . Mitral regurgitation 04/23/2017  . Thrombus - possible apical thrombus 01/2017 04/23/2017  . Hyperlipidemia 04/23/2017  . Pulmonary hypertension ([place])   . SOB (shortness of breath)   . Palliative care by specialist   . Tobacco abuse 04/19/2017  . Acute respiratory failure with hypoxia ([place]) 01/27/2017  . Hypertensive urgency 01/27/2017  . CHF (congestive heart failure) ([place]) 01/26/2017  . CKD (chronic kidney disease), stage III ([place]) 01/04/2017  . Essential hypertension 01/04/2017  . CVA (cerebral infarction) 12/12/2013  . Hyponatremia 12/06/2012  . Hiatal hernia 12/06/2012  . Syncope 12/06/2012  . Schizophrenia ([place])     Current Outpatient Medications:  .  aspirin 81 MG EC tablet, TAKE 1 TABLET BY MOUTH EVERY DAY, Disp: 30 tablet, Rfl: 1 .  atorvastatin (LIPITOR) 40 MG tablet, TAKE ONE TABLET BY MOUTH EVERY DAY IN THE EVENING, Disp: 30 tablet, Rfl: 6 .  bisoprolol (ZEBETA) 5 MG tablet, Take 1.5 tablets (7.5 mg total) by mouth daily., Disp: 90 tablet, Rfl: 3 .  furosemide (LASIX) 40 MG tablet, Take 1 tablet (40 mg total) by mouth daily. If weight goes up 2-3lbs in a day or 3-4lbs in a week, increase to 40mg  in AM AND 20mg  in the PM, Disp: 60 tablet, Rfl: 6 .  isosorbide-hydrALAZINE (BIDIL) 20-37.5 MG tablet, TAKE 2 TABLETS BY MOUTH 3 (THREE) TIMES DAILY., Disp: 180 tablet, Rfl: 6 .  potassium chloride SA (K-DUR,KLOR-CON) 20 MEQ tablet, TAKE ONE TABLET BY MOUTH ONCE DAILY (AM), Disp: 30 tablet, Rfl: 6 .  spironolactone (ALDACTONE) 25 MG tablet, TAKE ONE TABLET BY MOUTH ONCE DAILY (BEDTIME), Disp: 30 tablet, Rfl: 5 .  albuterol (PROVENTIL HFA;VENTOLIN HFA) 108 (90 Base) MCG/ACT inhaler, Inhale 1-2 puffs into the lungs  every 6 (six) hours as needed for wheezing or shortness of breath., Disp: , Rfl:  .  budesonide-formoterol (SYMBICORT) 160-4.5 MCG/ACT inhaler, Inhale 2 puffs into the lungs 2 (two) times daily., Disp: 1 Inhaler, Rfl: 6 .  losartan (COZAAR) 25 MG tablet, Take 1 tablet (25 mg total) by mouth daily., Disp: 90 tablet, Rfl: 3 .  Tiotropium Bromide Monohydrate (SPIRIVA RESPIMAT) 2.5 MCG/ACT AERS, Inhale 1 spray into the lungs daily., Disp: 1 Inhaler, Rfl: 5 Allergies  Allergen Reactions  . Sulfa Antibiotics Nausea Only     Social History   Socioeconomic History  . Marital status: Single    Spouse name: Not on file  . Number of children: Not on file  . Years of education: Not on file  . Highest education level: Not on file  Occupational History  . Not on file  Social Needs  . Financial resource strain: Not on file  . Food insecurity:    Worry: Not on file    Inability: Not on file  . Transportation needs:    Medical: Not on file    Non-medical: Not on file  Tobacco Use  . Smoking status: Current Every Day Smoker    Packs/day: 1.50    Years: 44.00    Pack years: 66.00    Types: Cigarettes    Start date: 48  . Smokeless tobacco: Never Used  . Tobacco comment: 1ppd as of 12/06/17 ep  Substance and Sexual Activity  .  Alcohol use: No  . Drug use: No  . Sexual activity: Not on file  Lifestyle  . Physical activity:    Days per week: Not on file    Minutes per session: Not on file  . Stress: Not on file  Relationships  . Social connections:    Talks on phone: Not on file    Gets together: Not on file    Attends religious service: Not on file    Active member of club or organization: Not on file    Attends meetings of clubs or organizations: Not on file    Relationship status: Not on file  . Intimate partner violence:    Fear of current or ex partner: Not on file    Emotionally abused: Not on file    Physically abused: Not on file    Forced sexual activity: Not on file   Other Topics Concern  . Not on file  Social History Narrative  . Not on file    Physical Exam  Pulmonary/Chest: Effort normal.  Abdominal: Soft. He exhibits no distension.  Musculoskeletal: He exhibits no edema.  Skin: Skin is warm and dry. He is not diaphoretic.        Future Appointments  Date Time Provider [place]  06/05/2018  9:00 AM Brand Males, MD [facility] None  06/15/2018 10:20 AM [name], MD [facility] None     BP 122/70 (BP Location: Left Arm, Patient Position: Standing, Cuff Size: Normal)   Pulse 98   Wt 144 lb 3.2 oz (65.4 kg)   SpO2 96%   BMI 20.69 kg/m   Weight yesterday-pt didn't weigh Last visit weight-143  ATF pt CAO x4 getting ready for work. Pt took his morning medications. Pt denies sob, chest pain and dizziness. Pt stated that he is able to work with very little difficulty breathing. Pt stated that he would like more information about "stents". Pt continues to watch his sodium intake/pt is still smoking.  rx bottles verified and pill box refilled.    Jarrett Chicoine, EMT Paramedic [phone] 05/30/2018    ACTION: Home visit completed

## 2018-06-05 ENCOUNTER — Encounter: Payer: Self-pay | Admitting: Internal Medicine

## 2018-06-05 ENCOUNTER — Ambulatory Visit: Payer: Medicare Other | Admitting: Internal Medicine

## 2018-06-05 VITALS — BP 146/98 | HR 80 | Ht 70.0 in | Wt 151.0 lb

## 2018-06-05 DIAGNOSIS — Z129 Encounter for screening for malignant neoplasm, site unspecified: Secondary | ICD-10-CM

## 2018-06-05 DIAGNOSIS — J449 Chronic obstructive pulmonary disease, unspecified: Secondary | ICD-10-CM | POA: Diagnosis not present

## 2018-06-05 DIAGNOSIS — F17210 Nicotine dependence, cigarettes, uncomplicated: Secondary | ICD-10-CM

## 2018-06-05 NOTE — Patient Instructions (Signed)
Stage 3 severe COPD by GOLD classification ([place])  - stable though some wheezing which you say is because of not taking morning inhalers - continue spiriva and symbicort scheduled - use albuterol as needed  - pnemovax next visit   Cigarette smoker - please quit  -respect you not wanting to take medications to help quitting but want you to reconsider that  Lung Cancer Screening CT  -repeat low dose CT in 6 months  Followup 6 months or sooner

## 2018-06-05 NOTE — Progress Notes (Signed)
PCP [name], MD  HPI  IOV 05/17/2017  Chief Complaint  Patient presents with  . Advice Only    Referred by Dr. Deforest Hoyles due to COPD.  Pt has complaints of SOB, cough with white mucus. Denies any CP.    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  He has been referred by Dr. Deforest Hoyles for question of COPD according to patient complaint.  Patient himself states that he has only been short of breath for the last 1 year.  It is insidious onset.  Associated with cough and wheezing.  In July 2018 did get diagnosed with systolic heart failure and he says "a lot of fluid was pulled out" and he lost weight.  After this his symptoms have improved significantly although he still does have some residual shortness of breath cough and wheezing.  The shortness of breath is mild present only with exertion.  In fact when we walked him in the office today he did not desaturate not complain of shortness of breath.  She did have some wheezing on exam.  He continues to smoke 2 packs a day.  He started smoking  in 1974  20 years  Personal visualization of imaging: October 2018 chest x-ray shows cardiomegaly and some pulmonary congestion from his heart failure.  2014 and 2015 CT abdomen lung cuts in the base does not show any evidence of ILD.  Walking desaturation test on 05/17/2017 185 feet x 3 laps:  did NOT desaturate. Rest pulse ox was 100%, final pulse ox was 100%. HR response 84/min at rest to 93/min at peak exertion.  Echocardiogram January 04, 2017: Ejection fraction 15%   OV 06/22/2017  Chief Complaint  Patient presents with  . Follow-up    follow up for PFT   Gerald Nelson [birth year] 67 y.o. male [address] [place] [address] presents for follow-up for symptoms of smoking history associated with cough and wheezing.  I put him on Spiriva for empiric diagnosis of COPD.  He tells me that with this cough wheezing and shortness of breath is  significantly improved.  He did have pulmonary function test today documented below it shows severe Gold stage III COPD with significant bronchodilator response.  At this point in time he feels well.  His COPD CAT score today on Spiriva as documented below.  Immunization review shows that he has had his flu shot but will benefit from pneumonia vaccine   OV 12/06/2017  Chief Complaint  Patient presents with  . Follow-up    Pt states he has been doing good since last visit and denies any complaints or concerns.    Gerald Nelson has stage 3 copd with alpha 1 MM - Gerald Nelson , 67 y.o. , with dob 1951-07-04 and male ,Not Hispanic or Latino from [place] [id number] - presents to pulm clinic for followup opf  Stage 3 severe COPD by GOLD classification ([place]) : -COPD stable.  COPD CAT score is 10 reflecting very minimal symptoms.  He tells me that he is only taking Spiriva daily.  He does not take Symbicort.  He takes albuterol as needed.  He does not feel that he needs another inhaler.  Overall he feels improved from last visit because of Spiriva  Cigarette smoker: Continues to smoke.  In fact sitting in the room with a huge ashtray.  He says it is very difficult for him to quit.  We discussed quitting smoking.  He says he will quit on his own.  Next visit he is willing to talk about medication therapy.  Coronary artery calcification seen on CAT scan - -incidental finding on CT scan January 2019.  Realized only today.  He does not have any chest pain.  He is willing to see cardiology.  He does not believe he has a cardiologist although he has a past history of heart failure according to his history.     Results for Gerald Nelson, Gerald Nelson (MRN [id number]) as of 06/22/2017 11:49  Ref. Range 06/22/2017 09:40  FEV1-Post Latest Units: L 1.12/41%  FEV1-%Change-Post Latest Units: % 27  Post FEV1/FVC ratio Latest Units: % 51  dlco ```  12.22/43%    OV 06/05/2018  Subjective:  Patient ID:  Gerald Nelson, male , DOB: 1950-12-29 , age 64 y.o. , MRN: [id number] , ADDR[address]: [facility] [id number]   06/05/2018 -   Chief Complaint  Patient presents with  . Follow-up    Doing well no changes at this time.     HPI Gerald Nelson 67 y.o. -presents for COPD follow-up and smoking and lung cancer screening  COPD: He says it is stable.  His COPD CAT score is 5.  He is now on Spiriva.  He says a second inhaler presumably Symbicort has been restarted in the last few days by his primary care physician.  He says his symptoms are minimal and is doing better.  However he is wheezing this morning but he feels is because he did not take his morning inhalers.  He does not want any prednisone.  He categorically denies any exacerbation  Smoking: He continues to smoke.  He realizes the need to quit.  He says that he does not want medications and will try to quit on his own  Lung cancer screening: Last CT scan was in early 2019.  His neck CT scan needs to be in early 2020 or at least in the first half  Past medical history: Review of the records indicates he is now seen cardiology for risk chronic systolic heart failure and is following with Dr. Loralie Champagne.    CAT COPD Symptom & Quality of Life Score (GSK trademark) 0 is no burden. 5 is highest burden 06/05/2018   Never Cough -> Cough all the time 2  No phlegm in chest -> Chest is full of phlegm 0  No chest tightness -> Chest feels very tight 0  No dyspnea for 1 flight stairs/hill -> Very dyspneic for 1 flight of stairs 0  No limitations for ADL at home -> Very limited with ADL at home 0  Confident leaving home -> Not at all confident leaving home 0  Sleep soundly -> Do not sleep soundly because of lung condition 0  Lots of Energy -> No energy at all 3  TOTAL Score (max 40)  5        ROS - per HPI     has a past medical history of Apical mural thrombus, Arthritis, CHF (congestive heart failure) (H[facility]), Chronic systolic  CHF (congestive heart failure) ([place]), CKD (chronic kidney disease), stage III ([facility]), COPD (chronic obstructive pulmonary disease) ([facility]), Hernia, hiatal, Hyperlipidemia, Hypertension, Hyponatremia, Mitral regurgitation, Pulmonary hypertension ([place]), Schizophrenia ([place]), and Tobacco abuse.   reports that he has been smoking cigarettes. He started smoking about 45 years ago. He has a 66.00 pack-year smoking history. He has never used smokeless tobacco.  Past Surgical History:  Procedure Laterality Date  . PENILE PROSTHESIS IMPLANT      Allergies  Allergen Reactions  . Sulfa Antibiotics Nausea Only    Immunization History  Administered Date(s) Administered  . Influenza Inj Mdck Quad Pf 04/22/2018  . Influenza, High Dose Seasonal PF 04/16/2017  . Pneumococcal Conjugate-13 06/22/2017    Family History  Problem Relation Age of Onset  . Hypertension Mother   . Hypertension Father      Current Outpatient Medications:  .  albuterol (PROVENTIL HFA;VENTOLIN HFA) 108 (90 Base) MCG/ACT inhaler, Inhale 1-2 puffs into the lungs every 6 (six) hours as needed for wheezing or shortness of breath., Disp: , Rfl:  .  aspirin 81 MG EC tablet, TAKE 1 TABLET BY MOUTH EVERY DAY, Disp: 30 tablet, Rfl: 1 .  atorvastatin (LIPITOR) 40 MG tablet, TAKE ONE TABLET BY MOUTH EVERY DAY IN THE EVENING, Disp: 30 tablet, Rfl: 6 .  bisoprolol (ZEBETA) 5 MG tablet, Take 1.5 tablets (7.5 mg total) by mouth daily., Disp: 90 tablet, Rfl: 3 .  budesonide-formoterol (SYMBICORT) 160-4.5 MCG/ACT inhaler, Inhale 2 puffs into the lungs 2 (two) times daily., Disp: 1 Inhaler, Rfl: 6 .  furosemide (LASIX) 40 MG tablet, Take 1 tablet (40 mg total) by mouth daily. If weight goes up 2-3lbs in a day or 3-4lbs in a week, increase to 40mg  in AM AND 20mg  in the PM, Disp: 60 tablet, Rfl: 6 .  isosorbide-hydrALAZINE (BIDIL) 20-37.5 MG tablet, TAKE 2 TABLETS BY MOUTH 3 (THREE) TIMES DAILY., Disp: 180 tablet, Rfl: 6 .  potassium chloride SA  (K-DUR,KLOR-CON) 20 MEQ tablet, TAKE ONE TABLET BY MOUTH ONCE DAILY (AM), Disp: 30 tablet, Rfl: 6 .  spironolactone (ALDACTONE) 25 MG tablet, TAKE ONE TABLET BY MOUTH ONCE DAILY (BEDTIME), Disp: 30 tablet, Rfl: 5 .  Tiotropium Bromide Monohydrate (SPIRIVA RESPIMAT) 2.5 MCG/ACT AERS, Inhale 1 spray into the lungs daily., Disp: 1 Inhaler, Rfl: 5 .  losartan (COZAAR) 25 MG tablet, Take 1 tablet (25 mg total) by mouth daily., Disp: 90 tablet, Rfl: 3      Objective:   Vitals:   06/05/18 0924  BP: (!) 146/98  Pulse: 80  SpO2: 96%  Weight: 151 lb (68.5 kg)  Height: 5\' 10"  (1.778 m)    Estimated body mass index is 21.67 kg/m as calculated from the following:   Height as of this encounter: 5\' 10"  (1.778 m).   Weight as of this encounter: 151 lb (68.5 kg).  @WEIGHTCHANGE @  [employer]   06/05/18 0924  Weight: 151 lb (68.5 kg)     Physical Exam  General Appearance:    Alert, cooperative, no distress, appears stated age - older , Deconditioned looking - yes , OBESE  - no, Sitting on Wheelchair -  No. DISHEVELEd. SMELLS OF TOBACCO  Head:    Normocephalic, without obvious abnormality, atraumatic  Eyes:    PERRL, conjunctiva/corneas clear,  Ears:    Normal TM's and external ear canals, both ears  Nose:   Nares normal, septum midline, mucosa normal, no drainage    or sinus tenderness. OXYGEN ON  - no . Patient is @ ra   Throat:   Lips, mucosa, and tongue normal; teeth and gums normal. Cyanosis on lips - no  Neck:   Supple, symmetrical, trachea midline, no adenopathy;    thyroid:  no enlargement/tenderness/nodules; no carotid   bruit or JVD  Back:     Symmetric, no curvature, ROM normal, no CVA tenderness  Lungs:  Distress - no , Wheeze YES, Barrell Chest - no, Purse lip breathing - no, Crackles - no   Chest Wall:    No tenderness or deformity.    Heart:    Regular rate and rhythm, S1 and S2 normal, no rub   or gallop, Murmur - no  Breast Exam:    NOT DONE  Abdomen:     Soft,  non-tender, bowel sounds active all four quadrants,    no masses, no organomegaly. Visceral obesity - no  Genitalia:   NOT DONE  Rectal:   NOT DONE  Extremities:   Extremities - normal, Has Cane - no, Clubbing - no, Edema - no  Pulses:   2+ and symmetric all extremities  Skin:   Stigmata of Connective Tissue Disease - no  Lymph nodes:   Cervical, supraclavicular, and axillary nodes normal  Psychiatric:  Neurologic:   [place] - yes, Anxious - no, Flat affect - yes  CAm-ICU - neg, Alert and Oriented x 3 - yes, Moves all 4s - yes, Speech - normal, Cognition - intact           Assessment:       ICD-10-CM   1. Stage 3 severe COPD by GOLD classification ([place]) J44.9   2. Cigarette smoker F17.210   3. Cancer screening Z12.9        Plan:     Patient Instructions  Stage 3 severe COPD by GOLD classification ([place])  - stable though some wheezing which you say is because of not taking morning inhalers - continue spiriva and symbicort scheduled - use albuterol as needed  - pnemovax next visit   Cigarette smoker - please quit  -respect you not wanting to take medications to help quitting but want you to reconsider that  Lung Cancer Screening CT  -repeat low dose CT in 6 months  Followup 6 months or sooner      SIGNATURE    Dr. Brand Males, M.D., F.C.C.P,  Pulmonary and Critical Care Medicine Staff Physician, [place] Director - Interstitial Lung Disease  Program  [facility] [place] [place], [place], [address]  Pager: [phone], If no answer or between  15:00h - 7:00h: call [phone] Telephone: [phone]  9:55 AM 06/05/2018

## 2018-06-07 ENCOUNTER — Other Ambulatory Visit (HOSPITAL_COMMUNITY): Payer: Self-pay

## 2018-06-07 NOTE — Progress Notes (Signed)
Paramedicine Encounter    Patient ID: Gerald Nelson, male    DOB: 07-11-1950, 67 y.o.   MRN: [id number]   Patient Care Team: [name], MD as PCP - General (Internal Medicine)  Patient Active Problem List   Diagnosis Date Noted  . Noncompliance 04/23/2017  . Mitral regurgitation 04/23/2017  . Thrombus - possible apical thrombus 01/2017 04/23/2017  . Hyperlipidemia 04/23/2017  . Pulmonary hypertension ([place])   . SOB (shortness of breath)   . Palliative care by specialist   . Tobacco abuse 04/19/2017  . Acute respiratory failure with hypoxia ([place]) 01/27/2017  . Hypertensive urgency 01/27/2017  . CHF (congestive heart failure) ([place]) 01/26/2017  . CKD (chronic kidney disease), stage III ([place]) 01/04/2017  . Essential hypertension 01/04/2017  . CVA (cerebral infarction) 12/12/2013  . Hyponatremia 12/06/2012  . Hiatal hernia 12/06/2012  . Syncope 12/06/2012  . Schizophrenia ([place])     Current Outpatient Medications:  .  albuterol (PROVENTIL HFA;VENTOLIN HFA) 108 (90 Base) MCG/ACT inhaler, Inhale 1-2 puffs into the lungs every 6 (six) hours as needed for wheezing or shortness of breath., Disp: , Rfl:  .  aspirin 81 MG EC tablet, TAKE 1 TABLET BY MOUTH EVERY DAY, Disp: 30 tablet, Rfl: 1 .  atorvastatin (LIPITOR) 40 MG tablet, TAKE ONE TABLET BY MOUTH EVERY DAY IN THE EVENING, Disp: 30 tablet, Rfl: 6 .  bisoprolol (ZEBETA) 5 MG tablet, Take 1.5 tablets (7.5 mg total) by mouth daily., Disp: 90 tablet, Rfl: 3 .  budesonide-formoterol (SYMBICORT) 160-4.5 MCG/ACT inhaler, Inhale 2 puffs into the lungs 2 (two) times daily., Disp: 1 Inhaler, Rfl: 6 .  furosemide (LASIX) 40 MG tablet, Take 1 tablet (40 mg total) by mouth daily. If weight goes up 2-3lbs in a day or 3-4lbs in a week, increase to 40mg  in AM AND 20mg  in the PM, Disp: 60 tablet, Rfl: 6 .  isosorbide-hydrALAZINE (BIDIL) 20-37.5 MG tablet, TAKE 2 TABLETS BY MOUTH 3 (THREE) TIMES DAILY., Disp: 180 tablet, Rfl: 6 .  potassium chloride  SA (K-DUR,KLOR-CON) 20 MEQ tablet, TAKE ONE TABLET BY MOUTH ONCE DAILY (AM), Disp: 30 tablet, Rfl: 6 .  spironolactone (ALDACTONE) 25 MG tablet, TAKE ONE TABLET BY MOUTH ONCE DAILY (BEDTIME), Disp: 30 tablet, Rfl: 5 .  Tiotropium Bromide Monohydrate (SPIRIVA RESPIMAT) 2.5 MCG/ACT AERS, Inhale 1 spray into the lungs daily., Disp: 1 Inhaler, Rfl: 5 .  losartan (COZAAR) 25 MG tablet, Take 1 tablet (25 mg total) by mouth daily., Disp: 90 tablet, Rfl: 3 Allergies  Allergen Reactions  . Sulfa Antibiotics Nausea Only      Social History   Socioeconomic History  . Marital status: Single    Spouse name: Not on file  . Number of children: Not on file  . Years of education: Not on file  . Highest education level: Not on file  Occupational History  . Not on file  Social Needs  . Financial resource strain: Not on file  . Food insecurity:    Worry: Not on file    Inability: Not on file  . Transportation needs:    Medical: Not on file    Non-medical: Not on file  Tobacco Use  . Smoking status: Current Every Day Smoker    Packs/day: 1.50    Years: 44.00    Pack years: 66.00    Types: Cigarettes    Start date: 23  . Smokeless tobacco: Never Used  . Tobacco comment: 1ppd as of 12/06/17 ep  Substance and Sexual Activity  .  Alcohol use: No  . Drug use: No  . Sexual activity: Not on file  Lifestyle  . Physical activity:    Days per week: Not on file    Minutes per session: Not on file  . Stress: Not on file  Relationships  . Social connections:    Talks on phone: Not on file    Gets together: Not on file    Attends religious service: Not on file    Active member of club or organization: Not on file    Attends meetings of clubs or organizations: Not on file    Relationship status: Not on file  . Intimate partner violence:    Fear of current or ex partner: Not on file    Emotionally abused: Not on file    Physically abused: Not on file    Forced sexual activity: Not on file   Other Topics Concern  . Not on file  Social History Narrative  . Not on file    Physical Exam  Constitutional: He is oriented to person, place, and time.  Cardiovascular: Normal rate and regular rhythm.  Pulmonary/Chest: Effort normal and breath sounds normal.  Abdominal: Soft.  Musculoskeletal: Normal range of motion. He exhibits no edema.  Neurological: He is alert and oriented to person, place, and time.  Skin: Skin is warm and dry.  Psychiatric: He has a normal mood and affect.        Future Appointments  Date Time Provider [place]  06/15/2018 10:20 AM [name], MD [facility] None    BP 128/78 (BP Location: Left Arm, Patient Position: Sitting, Cuff Size: Large)   Pulse 90   Resp 20   Wt 142 lb (64.4 kg)   SpO2 94%   BMI 20.37 kg/m   Weight yesterday- Did not weigh Last visit weight- 144 lb  Gerald Nelson was seen at home today and reported feeling well. He denied chest pain, SOB, headache, dizziness or orthopnea. His medications were verified and his pillbox was refilled.  Jacquiline Doe, EMT 06/07/18  ACTION: Home visit completed

## 2018-06-13 ENCOUNTER — Other Ambulatory Visit (HOSPITAL_COMMUNITY): Payer: Self-pay

## 2018-06-13 ENCOUNTER — Other Ambulatory Visit (HOSPITAL_COMMUNITY): Payer: Self-pay | Admitting: Student

## 2018-06-13 ENCOUNTER — Encounter (HOSPITAL_COMMUNITY): Payer: Self-pay

## 2018-06-13 ENCOUNTER — Other Ambulatory Visit (HOSPITAL_COMMUNITY): Payer: Self-pay | Admitting: Cardiology

## 2018-06-13 ENCOUNTER — Telehealth (HOSPITAL_COMMUNITY): Payer: Self-pay

## 2018-06-13 NOTE — Telephone Encounter (Signed)
Pt called to confirm today's CHP visit.  

## 2018-06-13 NOTE — Progress Notes (Signed)
Paramedicine Encounter    Patient ID: Gerald Nelson, male    DOB: 1951/02/21, 67 y.o.   MRN: [id number]    Patient Care Team: [name], MD as PCP - General (Internal Medicine)  Patient Active Problem List   Diagnosis Date Noted  . Noncompliance 04/23/2017  . Mitral regurgitation 04/23/2017  . Thrombus - possible apical thrombus 01/2017 04/23/2017  . Hyperlipidemia 04/23/2017  . Pulmonary hypertension ([place])   . SOB (shortness of breath)   . Palliative care by specialist   . Tobacco abuse 04/19/2017  . Acute respiratory failure with hypoxia ([place]) 01/27/2017  . Hypertensive urgency 01/27/2017  . CHF (congestive heart failure) ([place]) 01/26/2017  . CKD (chronic kidney disease), stage III ([place]) 01/04/2017  . Essential hypertension 01/04/2017  . CVA (cerebral infarction) 12/12/2013  . Hyponatremia 12/06/2012  . Hiatal hernia 12/06/2012  . Syncope 12/06/2012  . Schizophrenia ([place])     Current Outpatient Medications:  .  albuterol (PROVENTIL HFA;VENTOLIN HFA) 108 (90 Base) MCG/ACT inhaler, Inhale 1-2 puffs into the lungs every 6 (six) hours as needed for wheezing or shortness of breath., Disp: , Rfl:  .  aspirin 81 MG EC tablet, TAKE 1 TABLET BY MOUTH EVERY DAY, Disp: 30 tablet, Rfl: 1 .  atorvastatin (LIPITOR) 40 MG tablet, TAKE ONE TABLET BY MOUTH EVERY DAY IN THE EVENING, Disp: 30 tablet, Rfl: 6 .  bisoprolol (ZEBETA) 5 MG tablet, Take 1.5 tablets (7.5 mg total) by mouth daily., Disp: 90 tablet, Rfl: 3 .  furosemide (LASIX) 40 MG tablet, Take 1 tablet (40 mg total) by mouth daily. If weight goes up 2-3lbs in a day or 3-4lbs in a week, increase to 40mg  in AM AND 20mg  in the PM, Disp: 60 tablet, Rfl: 6 .  isosorbide-hydrALAZINE (BIDIL) 20-37.5 MG tablet, TAKE 2 TABLETS BY MOUTH 3 (THREE) TIMES DAILY., Disp: 180 tablet, Rfl: 6 .  potassium chloride SA (K-DUR,KLOR-CON) 20 MEQ tablet, TAKE ONE TABLET BY MOUTH ONCE DAILY (AM), Disp: 30 tablet, Rfl: 6 .  spironolactone (ALDACTONE) 25 MG  tablet, TAKE ONE TABLET BY MOUTH ONCE DAILY (BEDTIME), Disp: 30 tablet, Rfl: 5 .  Tiotropium Bromide Monohydrate (SPIRIVA RESPIMAT) 2.5 MCG/ACT AERS, Inhale 1 spray into the lungs daily., Disp: 1 Inhaler, Rfl: 5 .  budesonide-formoterol (SYMBICORT) 160-4.5 MCG/ACT inhaler, Inhale 2 puffs into the lungs 2 (two) times daily., Disp: 1 Inhaler, Rfl: 6 .  losartan (COZAAR) 25 MG tablet, Take 1 tablet (25 mg total) by mouth daily., Disp: 90 tablet, Rfl: 3 Allergies  Allergen Reactions  . Sulfa Antibiotics Nausea Only     Social History   Socioeconomic History  . Marital status: Single    Spouse name: Not on file  . Number of children: Not on file  . Years of education: Not on file  . Highest education level: Not on file  Occupational History  . Not on file  Social Needs  . Financial resource strain: Not on file  . Food insecurity:    Worry: Not on file    Inability: Not on file  . Transportation needs:    Medical: Not on file    Non-medical: Not on file  Tobacco Use  . Smoking status: Current Every Day Smoker    Packs/day: 1.50    Years: 44.00    Pack years: 66.00    Types: Cigarettes    Start date: 25  . Smokeless tobacco: Never Used  . Tobacco comment: 1ppd as of 12/06/17 ep  Substance and Sexual Activity  .  Alcohol use: No  . Drug use: No  . Sexual activity: Not on file  Lifestyle  . Physical activity:    Days per week: Not on file    Minutes per session: Not on file  . Stress: Not on file  Relationships  . Social connections:    Talks on phone: Not on file    Gets together: Not on file    Attends religious service: Not on file    Active member of club or organization: Not on file    Attends meetings of clubs or organizations: Not on file    Relationship status: Not on file  . Intimate partner violence:    Fear of current or ex partner: Not on file    Emotionally abused: Not on file    Physically abused: Not on file    Forced sexual activity: Not on file  Other  Topics Concern  . Not on file  Social History Narrative  . Not on file    Physical Exam  Pulmonary/Chest: Effort normal. No respiratory distress. He has wheezes. He has no rales.  Abdominal: There is no tenderness. There is no guarding.  Musculoskeletal: He exhibits no edema.  Skin: He is not diaphoretic.        Future Appointments  Date Time Provider [place]  06/15/2018 10:20 AM [name], MD [facility] None     BP 118/88 (BP Location: Left Arm, Patient Position: Standing, Cuff Size: Normal)   Pulse 93   Resp 16   Wt 138 lb 6.4 oz (62.8 kg)   SpO2 96%   BMI 19.86 kg/m   Weight yesterday-didn't weigh Last visit weight- 144 (12/4)  ATF pt CAO x4 standing in the kitchen with no complaints. Pt stated that he saw the pulmonologist this week and got a good report. Pt stated that he's "trying to stop smoking but doesn't want to take any more medications".  Pt denies sob although wheezing is noted to four lung fields. Pt used inhaler during this visit after the assesment. Pt also denies chest pain and dizziness. Pt requested more information on stents, so he could take it to [name] for his advise.  Pt has taken his medications prior to our visit. rx bottles verified and pill box refilled. Pt is aware of the appointment with [facility] this week.   [name] prescribed pt Dulera 279mcg/5mcg per actuation/ no medication taken away or decreased.  Medication ordered: Losartan spirolactone Potassium bidil Bisoprolol atorvastan fursemide [employer] Gerald Nelson, EMT Paramedic [phone] 06/13/2018    ACTION: Home visit completed

## 2018-06-15 ENCOUNTER — Encounter (HOSPITAL_COMMUNITY): Payer: Self-pay | Admitting: Cardiology

## 2018-06-15 ENCOUNTER — Ambulatory Visit (HOSPITAL_COMMUNITY)
Admission: RE | Admit: 2018-06-15 | Discharge: 2018-06-15 | Disposition: A | Discharge status: Home / Self Care | Payer: Medicare Other | Source: Ambulatory Visit | Attending: Cardiology | Admitting: Cardiology

## 2018-06-15 ENCOUNTER — Other Ambulatory Visit (HOSPITAL_COMMUNITY): Payer: Self-pay

## 2018-06-15 VITALS — BP 106/72 | HR 71 | Wt 145.0 lb

## 2018-06-15 DIAGNOSIS — Z7982 Long term (current) use of aspirin: Secondary | ICD-10-CM | POA: Insufficient documentation

## 2018-06-15 DIAGNOSIS — I429 Cardiomyopathy, unspecified: Secondary | ICD-10-CM | POA: Diagnosis not present

## 2018-06-15 DIAGNOSIS — I513 Intracardiac thrombosis, not elsewhere classified: Secondary | ICD-10-CM

## 2018-06-15 DIAGNOSIS — Z720 Tobacco use: Secondary | ICD-10-CM

## 2018-06-15 DIAGNOSIS — F209 Schizophrenia, unspecified: Secondary | ICD-10-CM | POA: Insufficient documentation

## 2018-06-15 DIAGNOSIS — Z882 Allergy status to sulfonamides status: Secondary | ICD-10-CM | POA: Diagnosis not present

## 2018-06-15 DIAGNOSIS — Z8673 Personal history of transient ischemic attack (TIA), and cerebral infarction without residual deficits: Secondary | ICD-10-CM | POA: Diagnosis not present

## 2018-06-15 DIAGNOSIS — R0789 Other chest pain: Secondary | ICD-10-CM | POA: Diagnosis not present

## 2018-06-15 DIAGNOSIS — I272 Pulmonary hypertension, unspecified: Secondary | ICD-10-CM | POA: Insufficient documentation

## 2018-06-15 DIAGNOSIS — N183 Chronic kidney disease, stage 3 unspecified: Secondary | ICD-10-CM

## 2018-06-15 DIAGNOSIS — F17210 Nicotine dependence, cigarettes, uncomplicated: Secondary | ICD-10-CM | POA: Diagnosis not present

## 2018-06-15 DIAGNOSIS — I130 Hypertensive heart and chronic kidney disease with heart failure and stage 1 through stage 4 chronic kidney disease, or unspecified chronic kidney disease: Secondary | ICD-10-CM | POA: Insufficient documentation

## 2018-06-15 DIAGNOSIS — J449 Chronic obstructive pulmonary disease, unspecified: Secondary | ICD-10-CM | POA: Diagnosis not present

## 2018-06-15 DIAGNOSIS — I2510 Atherosclerotic heart disease of native coronary artery without angina pectoris: Secondary | ICD-10-CM | POA: Diagnosis not present

## 2018-06-15 DIAGNOSIS — Z79899 Other long term (current) drug therapy: Secondary | ICD-10-CM | POA: Insufficient documentation

## 2018-06-15 DIAGNOSIS — I5022 Chronic systolic (congestive) heart failure: Secondary | ICD-10-CM | POA: Insufficient documentation

## 2018-06-15 DIAGNOSIS — Z8249 Family history of ischemic heart disease and other diseases of the circulatory system: Secondary | ICD-10-CM | POA: Insufficient documentation

## 2018-06-15 LAB — PROTIME-INR
INR: 0.93
PROTHROMBIN TIME: 12.4 s (ref 11.4–15.2)

## 2018-06-15 LAB — BASIC METABOLIC PANEL
Anion gap: 9 (ref 5–15)
BUN: 28 mg/dL — AB (ref 8–23)
CHLORIDE: 103 mmol/L (ref 98–111)
CO2: 23 mmol/L (ref 22–32)
CREATININE: 1.89 mg/dL — AB (ref 0.61–1.24)
Calcium: 9.4 mg/dL (ref 8.9–10.3)
GFR calc Af Amer: 42 mL/min — ABNORMAL LOW (ref >60)
GFR calc non Af Amer: 36 mL/min — ABNORMAL LOW (ref >60)
Glucose, Bld: 77 mg/dL (ref 70–99)
POTASSIUM: 4.6 mmol/L (ref 3.5–5.1)
SODIUM: 135 mmol/L (ref 135–145)

## 2018-06-15 MED ORDER — BISOPROLOL FUMARATE 10 MG PO TABS: 10.0000 mg | ORAL_TABLET | Freq: Every day | ORAL | 3 refills | Status: DC

## 2018-06-15 NOTE — Progress Notes (Signed)
Paramedicine Encounter   Patient ID: Gerald Nelson , male,   DOB: [birth year],67 y.o.,  MRN: [id number]   Met patient in clinic today with provider. Dr. Aundra Dubin spoke with the patient about his increase in weight in which he provided advise about dieting as well as the need to quit smoking. Patient was also educated on the need for a heart cath, in which the patient stated he needed to speak to his PCP Gerald Nelson) about before making a decision. Patient appeared anxious about same and also verbalized anxiety about the heart cath to [facility] in a previous visit. Patient noted to have an upcoming appointment with PCP on the 23rd of December. Patient was advised that a nurse from the [facility] would be in touch with him in regards to the decision of the heart cath following his PCP visit. Dr. Aundra Dubin also advised patient of the improvement of his kidney functions. Patient is to follow up with Dr. Aundra Dubin in two months. Paramedicine follow up will be made next week.    Time spent with patient 25 mins.   Jeris Penta, EMT-Paramedic    Marylouise Stacks, EMT-Paramedic '@[phone]'$ [phone] 06/15/2018   ACTION: Home visit completed Next visit planned for 1 week

## 2018-06-15 NOTE — Progress Notes (Signed)
[facility] Note    Primary Care: [name]   Cardiology: Dr. Aundra Dubin   HPI: Gerald Nelson is a 67 y.o. male with a past medical history of chronic systolic CHF (EF 24% in July 2018), severe MR/TR, pulmonary HTN, CVA, CKD, schizophrenia, HTN and tobacco abuse.   Gerald Nelson was admitted to [facility] 7/2-01/06/17 for newly diagnosed acute systolic CHF. Echo showed LVEF 15%, severe global hypokinesis, moderate LVH, coarsetrabeculation of the LV apex with numerous false tendinae of the left ventricle, very stagnant blood flow at the LV apex withsmoke but no obvious LV thrombus - Definity contrast was given,again, noting stagnant apical blood flow - this could suggestrecent thrombus and certainly high risk for apical thrombusformation. Other findings include aortic sclerosis with mild AI,moderate to severe MR, moderate LAE, mild RAE, moderate to severeTR, moderate to severe pulmonary hypertension (RVSP 73 mmHg), dilated IVC, trivial posterior pericardial effusion.   He was admitted again in 10/18. He diuresed 25 pounds on IV lasix. He refused cath. His clonidine and diltazem were stopped during this admission. Referred to paramedicine. Discharge weight 133 pounds.   Echo 08/11/17 LVEF 15-20%, no MR noted.   Patient presents for followup of CHF.  He continues to smoke 1.5 ppd.  Followed by paramedicine.  Weight is up since last appointment but he reports dyspnea only with heavy exertion.  No problems walking on flat ground or up a flight of steps.  No lightheadedness.  He has occasional episodes of atypical chest pain. Episodes are short.  Creatinine lower when last checked.   ECG (personally reviewed): NSR, PACs  Labs (6/19): K 3.8, creatinine 2.2 Labs (11/19): K 4.1, creatinine 1.95, LDL 54, HDl 54  Review of systems complete and found to be negative unless listed in HPI.   PMH: 1. Chronic systolic CHF:  Cardiomyopathy of uncertain etiology, refused cath. He also refused ICD.   - Echo (7/18): Moderate LVH, severe FBSH, trabeculation at apex not meeting criteria for noncompaction, EF 15%, moderate to severe MR, low normal RV systolic function, moderate-severe TR.  - Echo (2/19): Moderate LVH, EF 15-20%, No mitral regurgitation, normal RV size and systolic function, mild TR.  2. H/o CVA 3. Schizophrenia 4. CKD stage 3 5. Mitral regurgitation: Suspect functional. Moderate-severe MR on 7/18 echo, minimal MR on 2/19 echo.  6. COPD: He is an active smoker.  - PFTs (12/18) suggestive of severe COPD.  7. H/o HTN 8. ? apical mural thrombus: Not seen on most recent echo in 2/19.  9. CAD: 1/19 CT chest showed coronary calcification.    Current Outpatient Medications  Medication Sig Dispense Refill  . albuterol (PROVENTIL HFA;VENTOLIN HFA) 108 (90 Base) MCG/ACT inhaler Inhale 1-2 puffs into the lungs every 6 (six) hours as needed for wheezing or shortness of breath.    Marland Kitchen aspirin 81 MG EC tablet TAKE 1 TABLET BY MOUTH EVERY DAY 30 tablet 1  . atorvastatin (LIPITOR) 40 MG tablet TAKE ONE TABLET BY MOUTH EVERY DAY IN THE EVENING 30 tablet 6  . bisoprolol (ZEBETA) 10 MG tablet Take 1 tablet (10 mg total) by mouth daily. 30 tablet 3  . budesonide-formoterol (SYMBICORT) 160-4.5 MCG/ACT inhaler Inhale 2 puffs into the lungs 2 (two) times daily. 1 Inhaler 6  . furosemide (LASIX) 40 MG tablet Take 1 tablet (40 mg total) by mouth daily. If weight goes up 2-3lbs in a day or 3-4lbs in a week, increase to 40mg  in AM AND 20mg  in the PM 60 tablet  6  . isosorbide-hydrALAZINE (BIDIL) 20-37.5 MG tablet TAKE 2 TABLETS BY MOUTH 3 (THREE) TIMES DAILY.(AM, NOON, BEDTIME) 180 tablet 0  . losartan (COZAAR) 25 MG tablet TAKE ONE TABLET BY MOUTH ONCE DAILY (AM) 30 tablet 10  . potassium chloride SA (K-DUR,KLOR-CON) 20 MEQ tablet TAKE ONE TABLET BY MOUTH ONCE DAILY (AM) 30 tablet 6  . spironolactone (ALDACTONE) 25 MG tablet TAKE ONE TABLET BY MOUTH ONCE DAILY (BEDTIME) 30 tablet 5  . Tiotropium  Bromide Monohydrate (SPIRIVA RESPIMAT) 2.5 MCG/ACT AERS Inhale 1 spray into the lungs daily. 1 Inhaler 5   No current facility-administered medications for this encounter.    Allergies  Allergen Reactions  . Sulfa Antibiotics Nausea Only   Social History   Socioeconomic History  . Marital status: Single    Spouse name: Not on file  . Number of children: Not on file  . Years of education: Not on file  . Highest education level: Not on file  Occupational History  . Not on file  Social Needs  . Financial resource strain: Not on file  . Food insecurity:    Worry: Not on file    Inability: Not on file  . Transportation needs:    Medical: Not on file    Non-medical: Not on file  Tobacco Use  . Smoking status: Current Every Day Smoker    Packs/day: 1.50    Years: 44.00    Pack years: 66.00    Types: Cigarettes    Start date: 42  . Smokeless tobacco: Never Used  . Tobacco comment: 1ppd as of 12/06/17 ep  Substance and Sexual Activity  . Alcohol use: No  . Drug use: No  . Sexual activity: Not on file  Lifestyle  . Physical activity:    Days per week: Not on file    Minutes per session: Not on file  . Stress: Not on file  Relationships  . Social connections:    Talks on phone: Not on file    Gets together: Not on file    Attends religious service: Not on file    Active member of club or organization: Not on file    Attends meetings of clubs or organizations: Not on file    Relationship status: Not on file  . Intimate partner violence:    Fear of current or ex partner: Not on file    Emotionally abused: Not on file    Physically abused: Not on file    Forced sexual activity: Not on file  Other Topics Concern  . Not on file  Social History Narrative  . Not on file      Family History  Problem Relation Age of Onset  . Hypertension Mother   . Hypertension Father    Vitals:   06/15/18 1032  BP: 106/72  Pulse: 71  SpO2: 99%  Weight: 65.8 kg (145 lb)   Wt  Readings from Last 3 Encounters:  06/15/18 65.8 kg (145 lb)  06/13/18 62.8 kg (138 lb 6.4 oz)  06/07/18 64.4 kg (142 lb)   PHYSICAL EXAM: General: NAD Neck: JVP 8 cm, no thyromegaly or thyroid nodule.  Lungs: Clear to auscultation bilaterally with normal respiratory effort. CV: Nondisplaced PMI.  Heart regular S1/S2, no S3/S4, no murmur.  No peripheral edema.  No carotid bruit.  Normal pedal pulses.  Abdomen: Soft, nontender, no hepatosplenomegaly, no distention.  Skin: Intact without lesions or rashes.  Neurologic: Alert and oriented x 3.  Psych: Normal affect. Extremities: No  clubbing or cyanosis.  HEENT: Normal.   ASSESSMENT & PLAN: 1. Chronic systolic CHF: Echo 03/7672 EF 15% with moderate-severe MR. Echo 08/11/17 LVEF 15-20%, no MR noted.  Cardiomyopathy of uncertain etiology, he has refused cath in the past.  NYHA class II symptoms.  He is not significantly volume overloaded on exam today.  Creatinine was lower at 1.95 when last checked.  - We discussed left/right heart cath today for assessment for CAD.  I am concerned that he has an ischemic cardiomyopathy, and it is possible that LV function could improve with revascularization. I will repeat BMET today, if creatinine remains < 2, I think it would be reasonable to cath him, aiming for minimal contrast use.  He wants to discuss cath with his PCP before committing to it.  - Continue Lasix 40 mg daily. BMET today.  - Increase bisoprolol to 10 mg daily.   - Continue losartan 25 mg daily.  - Continue spironolactone 25 mg daily.  - He saw EP earlier this year and refused ICD.  He is not a candidate for CRT.  2. Questionable apical thrombus: Not seen on most recent echo in 2/19. He is not anticoagulated.  3. Mitral regurgitation: Moderate to severe on 7/18 echo but minimal on 2/19 echo.  Suspect functional MR.  No significant murmur on exam today.  4. COPD: He continues to smoke.  Severe COPD by prior PFTs.  - I again encourage him to  quit.  5. CKD stage III: Repeat BMET today.  6. Schizophrenia: Affects compliance with medical care.  Continue paramedicine, this seems to be working well.  7. CAD: Noted on CT chest.  Concern for ischemic cardiomyopathy as above.  - Continue ASA 81.  - Continue atorvastatin, good LDL in 11/19.  - Arrange for cath if creatinine remains < 2 and patient is willing to consent to it.   Followup in 2 months.   Loralie Champagne 06/15/2018

## 2018-06-15 NOTE — Patient Instructions (Signed)
Labs done today  EKG done today  Follow up with Dr. Aundra Dubin in 2 months

## 2018-06-16 ENCOUNTER — Telehealth (HOSPITAL_COMMUNITY): Payer: Self-pay

## 2018-06-16 NOTE — Telephone Encounter (Signed)
Pt called with lab results will call us back after seeing his PCP if he wants to schedule the cath Creatinine better today. Would be reasonable to do catheterization, being careful with contrast.  Would set him up for this when agreeable, think he wants to see his PCP on 12/23 first.

## 2018-06-20 ENCOUNTER — Other Ambulatory Visit (HOSPITAL_COMMUNITY): Payer: Self-pay

## 2018-06-20 ENCOUNTER — Encounter (HOSPITAL_COMMUNITY): Payer: Self-pay

## 2018-06-20 ENCOUNTER — Telehealth (HOSPITAL_COMMUNITY): Payer: Self-pay

## 2018-06-20 NOTE — Progress Notes (Signed)
Paramedicine Encounter    Patient ID: Gerald Nelson, male    DOB: October 14, 1950, 67 y.o.   MRN: [id number]    Patient Care Team: [name], MD as PCP - General (Internal Medicine) [name], LCSW as Social Worker (Licensed Clinical Social Worker)  Patient Active Problem List   Diagnosis Date Noted  . Noncompliance 04/23/2017  . Mitral regurgitation 04/23/2017  . Thrombus - possible apical thrombus 01/2017 04/23/2017  . Hyperlipidemia 04/23/2017  . Pulmonary hypertension ([place])   . SOB (shortness of breath)   . Palliative care by specialist   . Tobacco abuse 04/19/2017  . Acute respiratory failure with hypoxia ([place]) 01/27/2017  . Hypertensive urgency 01/27/2017  . CHF (congestive heart failure) ([place]) 01/26/2017  . CKD (chronic kidney disease), stage III ([place]) 01/04/2017  . Essential hypertension 01/04/2017  . CVA (cerebral infarction) 12/12/2013  . Hyponatremia 12/06/2012  . Hiatal hernia 12/06/2012  . Syncope 12/06/2012  . Schizophrenia ([place])     Current Outpatient Medications:  .  aspirin 81 MG EC tablet, TAKE 1 TABLET BY MOUTH EVERY DAY, Disp: 30 tablet, Rfl: 1 .  atorvastatin (LIPITOR) 40 MG tablet, TAKE ONE TABLET BY MOUTH EVERY DAY IN THE EVENING, Disp: 30 tablet, Rfl: 6 .  bisoprolol (ZEBETA) 10 MG tablet, Take 1 tablet (10 mg total) by mouth daily., Disp: 30 tablet, Rfl: 3 .  furosemide (LASIX) 40 MG tablet, Take 1 tablet (40 mg total) by mouth daily. If weight goes up 2-3lbs in a day or 3-4lbs in a week, increase to 40mg  in AM AND 20mg  in the PM, Disp: 60 tablet, Rfl: 6 .  isosorbide-hydrALAZINE (BIDIL) 20-37.5 MG tablet, TAKE 2 TABLETS BY MOUTH 3 (THREE) TIMES DAILY.(AM, NOON, BEDTIME), Disp: 180 tablet, Rfl: 0 .  losartan (COZAAR) 25 MG tablet, TAKE ONE TABLET BY MOUTH ONCE DAILY (AM), Disp: 30 tablet, Rfl: 10 .  potassium chloride SA (K-DUR,KLOR-CON) 20 MEQ tablet, TAKE ONE TABLET BY MOUTH ONCE DAILY (AM), Disp: 30 tablet, Rfl: 6 .  spironolactone (ALDACTONE) 25  MG tablet, TAKE ONE TABLET BY MOUTH ONCE DAILY (BEDTIME), Disp: 30 tablet, Rfl: 5 .  albuterol (PROVENTIL HFA;VENTOLIN HFA) 108 (90 Base) MCG/ACT inhaler, Inhale 1-2 puffs into the lungs every 6 (six) hours as needed for wheezing or shortness of breath., Disp: , Rfl:  .  budesonide-formoterol (SYMBICORT) 160-4.5 MCG/ACT inhaler, Inhale 2 puffs into the lungs 2 (two) times daily., Disp: 1 Inhaler, Rfl: 6 .  Tiotropium Bromide Monohydrate (SPIRIVA RESPIMAT) 2.5 MCG/ACT AERS, Inhale 1 spray into the lungs daily., Disp: 1 Inhaler, Rfl: 5 Allergies  Allergen Reactions  . Sulfa Antibiotics Nausea Only     Social History   Socioeconomic History  . Marital status: Single    Spouse name: Not on file  . Number of children: Not on file  . Years of education: Not on file  . Highest education level: Not on file  Occupational History  . Not on file  Social Needs  . Financial resource strain: Not on file  . Food insecurity:    Worry: Not on file    Inability: Not on file  . Transportation needs:    Medical: Not on file    Non-medical: Not on file  Tobacco Use  . Smoking status: Current Every Day Smoker    Packs/day: 1.50    Years: 44.00    Pack years: 66.00    Types: Cigarettes    Start date: 9  . Smokeless tobacco: Never Used  .  Tobacco comment: 1ppd as of 12/06/17 ep  Substance and Sexual Activity  . Alcohol use: No  . Drug use: No  . Sexual activity: Not on file  Lifestyle  . Physical activity:    Days per week: Not on file    Minutes per session: Not on file  . Stress: Not on file  Relationships  . Social connections:    Talks on phone: Not on file    Gets together: Not on file    Attends religious service: Not on file    Active member of club or organization: Not on file    Attends meetings of clubs or organizations: Not on file    Relationship status: Not on file  . Intimate partner violence:    Fear of current or ex partner: Not on file    Emotionally abused: Not on  file    Physically abused: Not on file    Forced sexual activity: Not on file  Other Topics Concern  . Not on file  Social History Narrative  . Not on file    Physical Exam Cardiovascular:     Rate and Rhythm: Normal rate.  Pulmonary:     Effort: Pulmonary effort is normal. No respiratory distress.     Breath sounds: Rhonchi present.  Musculoskeletal:        General: No swelling.  Skin:    General: Skin is warm and dry.         Future Appointments  Date Time Provider [place]  08/17/2018 10:20 AM [name], MD [facility] None     BP 122/80 (BP Location: Left Arm, Patient Position: Standing, Cuff Size: Normal)   Pulse 83   Resp 16   Wt 145 lb 9.6 oz (66 kg)   SpO2 99%   BMI 20.89 kg/m   Weight yesterday-146 Last visit weight-138 (pt wrote down his weight @ 148) which is possible when I check his previous visit.   ATF pt CAO x4 standing in the living room smoking a cigarette.  Pt stated that he took his morning meds prior to my arrival. He missed Sunday nights dose because he forgot to take it before he went to sleep.  Pt denies sob, chest pain and dizziness.  He stated that he was up throughout the night, he stated that he wasn't due to sob. Pt has rhonchi noted to the lower lung fields/ wheezing on the right lower lobe.  He stated that he hasn't taken albuterol inhaler yet.  rx bottles verified and pill box refilled (2).  Pt was advised that I will be on vacation next week, therefore he was given 2 pill boxes.  The 2nd box labeled and taped until next week.   Medication ordered:  none [name], EMT Paramedic [phone] 06/20/2018    ACTION: Home visit completed

## 2018-06-20 NOTE — Telephone Encounter (Signed)
Pt called to confirm this am appointment.

## 2018-07-06 ENCOUNTER — Encounter (HOSPITAL_COMMUNITY): Payer: Self-pay

## 2018-07-06 ENCOUNTER — Telehealth (HOSPITAL_COMMUNITY): Payer: Self-pay

## 2018-07-06 ENCOUNTER — Other Ambulatory Visit (HOSPITAL_COMMUNITY): Payer: Self-pay

## 2018-07-06 NOTE — Progress Notes (Signed)
Paramedicine Encounter    Patient ID: Gerald Nelson, male    DOB: [birth year], 68 y.o.   MRN: [id number]    Patient Care Team: [name], MD as PCP - General (Internal Medicine) [name], LCSW as Social Worker (Licensed Clinical Social Worker)  Patient Active Problem List   Diagnosis Date Noted  . Noncompliance 04/23/2017  . Mitral regurgitation 04/23/2017  . Thrombus - possible apical thrombus 01/2017 04/23/2017  . Hyperlipidemia 04/23/2017  . Pulmonary hypertension ([place])   . SOB (shortness of breath)   . Palliative care by specialist   . Tobacco abuse 04/19/2017  . Acute respiratory failure with hypoxia ([place]) 01/27/2017  . Hypertensive urgency 01/27/2017  . CHF (congestive heart failure) ([place]) 01/26/2017  . CKD (chronic kidney disease), stage III ([place]) 01/04/2017  . Essential hypertension 01/04/2017  . CVA (cerebral infarction) 12/12/2013  . Hyponatremia 12/06/2012  . Hiatal hernia 12/06/2012  . Syncope 12/06/2012  . Schizophrenia ([place])     Current Outpatient Medications:  .  aspirin 81 MG EC tablet, TAKE 1 TABLET BY MOUTH EVERY DAY, Disp: 30 tablet, Rfl: 1 .  atorvastatin (LIPITOR) 40 MG tablet, TAKE ONE TABLET BY MOUTH EVERY DAY IN THE EVENING, Disp: 30 tablet, Rfl: 6 .  bisoprolol (ZEBETA) 10 MG tablet, Take 1 tablet (10 mg total) by mouth daily., Disp: 30 tablet, Rfl: 3 .  furosemide (LASIX) 40 MG tablet, Take 1 tablet (40 mg total) by mouth daily. If weight goes up 2-3lbs in a day or 3-4lbs in a week, increase to 40mg  in AM AND 20mg  in the PM, Disp: 60 tablet, Rfl: 6 .  isosorbide-hydrALAZINE (BIDIL) 20-37.5 MG tablet, TAKE 2 TABLETS BY MOUTH 3 (THREE) TIMES DAILY.(AM, NOON, BEDTIME), Disp: 180 tablet, Rfl: 0 .  losartan (COZAAR) 25 MG tablet, TAKE ONE TABLET BY MOUTH ONCE DAILY (AM), Disp: 30 tablet, Rfl: 10 .  potassium chloride SA (K-DUR,KLOR-CON) 20 MEQ tablet, TAKE ONE TABLET BY MOUTH ONCE DAILY (AM), Disp: 30 tablet, Rfl: 6 .  spironolactone (ALDACTONE) 25  MG tablet, TAKE ONE TABLET BY MOUTH ONCE DAILY (BEDTIME), Disp: 30 tablet, Rfl: 5 .  albuterol (PROVENTIL HFA;VENTOLIN HFA) 108 (90 Base) MCG/ACT inhaler, Inhale 1-2 puffs into the lungs every 6 (six) hours as needed for wheezing or shortness of breath., Disp: , Rfl:  .  budesonide-formoterol (SYMBICORT) 160-4.5 MCG/ACT inhaler, Inhale 2 puffs into the lungs 2 (two) times daily., Disp: 1 Inhaler, Rfl: 6 .  Tiotropium Bromide Monohydrate (SPIRIVA RESPIMAT) 2.5 MCG/ACT AERS, Inhale 1 spray into the lungs daily., Disp: 1 Inhaler, Rfl: 5 Allergies  Allergen Reactions  . Sulfa Antibiotics Nausea Only     Social History   Socioeconomic History  . Marital status: Single    Spouse name: Not on file  . Number of children: Not on file  . Years of education: Not on file  . Highest education level: Not on file  Occupational History  . Not on file  Social Needs  . Financial resource strain: Not on file  . Food insecurity:    Worry: Not on file    Inability: Not on file  . Transportation needs:    Medical: Not on file    Non-medical: Not on file  Tobacco Use  . Smoking status: Current Every Day Smoker    Packs/day: 1.50    Years: 44.00    Pack years: 66.00    Types: Cigarettes    Start date: 29  . Smokeless tobacco: Never Used  .  Tobacco comment: 1ppd as of 12/06/17 ep  Substance and Sexual Activity  . Alcohol use: No  . Drug use: No  . Sexual activity: Not on file  Lifestyle  . Physical activity:    Days per week: Not on file    Minutes per session: Not on file  . Stress: Not on file  Relationships  . Social connections:    Talks on phone: Not on file    Gets together: Not on file    Attends religious service: Not on file    Active member of club or organization: Not on file    Attends meetings of clubs or organizations: Not on file    Relationship status: Not on file  . Intimate partner violence:    Fear of current or ex partner: Not on file    Emotionally abused: Not on  file    Physically abused: Not on file    Forced sexual activity: Not on file  Other Topics Concern  . Not on file  Social History Narrative  . Not on file    Physical Exam Cardiovascular:     Rate and Rhythm: Normal rate.  Pulmonary:     Effort: No respiratory distress.     Breath sounds: Wheezing present. No rales.     Comments: Wheezing noted in both lower lobes Abdominal:     General: There is distension.     Tenderness: There is no abdominal tenderness. There is no guarding.  Neurological:     Mental Status: Gerald Nelson is alert.         Future Appointments  Date Time Provider [place]  07/13/2018  4:00 PM [facility]-CT 1 LBCT-CT [facility]  08/17/2018 10:20 AM [name], MD [facility] None     BP 114/90 (BP Location: Left Arm, Patient Position: Standing)   Pulse 84   Wt 142 lb 9.6 oz (64.7 kg)   SpO2 99%   BMI 20.46 kg/m   Weight yesterday-143.6 Last visit weight-145  ATF pt CAO x4 standing in the living room smoking a cigarette. Gerald Nelson state that Gerald Nelson hasn't taken his morning medications for the day yet, Gerald Nelson was given them during this visit. Gerald Nelson denies sob,chest pain and dizziness. Pt has taken all of his meds for the past two weeks. Gerald Nelson continues to weigh daily and watch his sodium and fluid intake. Wheezing noted in both lower lobes, Gerald Nelson hadn't used the inhaler for the day. Gerald Nelson was advised to use his inhaler due to the same.  rx bottles verified and pill box refilled.    Medication ordered: Bisoprolol  Gerald Nelson, EMT Paramedic [phone] 07/06/2018    ACTION: Home visit completed

## 2018-07-06 NOTE — Telephone Encounter (Signed)
Pt called to confirm today's appointment @ 10 am

## 2018-07-11 ENCOUNTER — Other Ambulatory Visit (HOSPITAL_COMMUNITY): Payer: Self-pay

## 2018-07-11 ENCOUNTER — Other Ambulatory Visit (HOSPITAL_COMMUNITY): Payer: Self-pay | Admitting: Cardiology

## 2018-07-11 ENCOUNTER — Encounter (HOSPITAL_COMMUNITY): Payer: Self-pay

## 2018-07-11 ENCOUNTER — Telehealth (HOSPITAL_COMMUNITY): Payer: Self-pay

## 2018-07-11 NOTE — Telephone Encounter (Signed)
Pt called to confirm today's [facility] visit @ 10:30

## 2018-07-11 NOTE — Progress Notes (Signed)
Due to transportation limitations, I picked up the medications from [place].  I also went to [facility] to get pt delsym for the cough.  Delsym is on our [facility] medication list and I confirmed with the on site pharmacist that it was ok for pt to take.

## 2018-07-11 NOTE — Progress Notes (Signed)
Paramedicine Encounter    Patient ID: Gerald Nelson, male    DOB: Jul 17, 1950, 68 y.o.   MRN: [id number]    Patient Care Team: [name], MD as PCP - General (Internal Medicine) [name], LCSW as Social Worker (Licensed Clinical Social Worker)  Patient Active Problem List   Diagnosis Date Noted  . Noncompliance 04/23/2017  . Mitral regurgitation 04/23/2017  . Thrombus - possible apical thrombus 01/2017 04/23/2017  . Hyperlipidemia 04/23/2017  . Pulmonary hypertension ([place])   . SOB (shortness of breath)   . Palliative care by specialist   . Tobacco abuse 04/19/2017  . Acute respiratory failure with hypoxia ([place]) 01/27/2017  . Hypertensive urgency 01/27/2017  . CHF (congestive heart failure) ([place]) 01/26/2017  . CKD (chronic kidney disease), stage III ([place]) 01/04/2017  . Essential hypertension 01/04/2017  . CVA (cerebral infarction) 12/12/2013  . Hyponatremia 12/06/2012  . Hiatal hernia 12/06/2012  . Syncope 12/06/2012  . Schizophrenia ([place])     Current Outpatient Medications:  .  aspirin 81 MG EC tablet, TAKE 1 TABLET BY MOUTH EVERY DAY, Disp: 30 tablet, Rfl: 1 .  atorvastatin (LIPITOR) 40 MG tablet, TAKE ONE TABLET BY MOUTH EVERY DAY IN THE EVENING, Disp: 30 tablet, Rfl: 6 .  bisoprolol (ZEBETA) 10 MG tablet, Take 1 tablet (10 mg total) by mouth daily., Disp: 30 tablet, Rfl: 3 .  furosemide (LASIX) 40 MG tablet, Take 1 tablet (40 mg total) by mouth daily. If weight goes up 2-3lbs in a day or 3-4lbs in a week, increase to 40mg  in AM AND 20mg  in the PM, Disp: 60 tablet, Rfl: 6 .  isosorbide-hydrALAZINE (BIDIL) 20-37.5 MG tablet, TAKE 2 TABLETS BY MOUTH 3 (THREE) TIMES DAILY.(AM, NOON, BEDTIME), Disp: 180 tablet, Rfl: 0 .  losartan (COZAAR) 25 MG tablet, TAKE ONE TABLET BY MOUTH ONCE DAILY (AM), Disp: 30 tablet, Rfl: 10 .  potassium chloride SA (K-DUR,KLOR-CON) 20 MEQ tablet, TAKE ONE TABLET BY MOUTH ONCE DAILY (AM), Disp: 30 tablet, Rfl: 6 .  spironolactone (ALDACTONE) 25  MG tablet, TAKE ONE TABLET BY MOUTH ONCE DAILY (BEDTIME), Disp: 30 tablet, Rfl: 5 .  albuterol (PROVENTIL HFA;VENTOLIN HFA) 108 (90 Base) MCG/ACT inhaler, Inhale 1-2 puffs into the lungs every 6 (six) hours as needed for wheezing or shortness of breath., Disp: , Rfl:  .  budesonide-formoterol (SYMBICORT) 160-4.5 MCG/ACT inhaler, Inhale 2 puffs into the lungs 2 (two) times daily., Disp: 1 Inhaler, Rfl: 6 .  Tiotropium Bromide Monohydrate (SPIRIVA RESPIMAT) 2.5 MCG/ACT AERS, Inhale 1 spray into the lungs daily., Disp: 1 Inhaler, Rfl: 5 Allergies  Allergen Reactions  . Sulfa Antibiotics Nausea Only     Social History   Socioeconomic History  . Marital status: Single    Spouse name: Not on file  . Number of children: Not on file  . Years of education: Not on file  . Highest education level: Not on file  Occupational History  . Not on file  Social Needs  . Financial resource strain: Not on file  . Food insecurity:    Worry: Not on file    Inability: Not on file  . Transportation needs:    Medical: Not on file    Non-medical: Not on file  Tobacco Use  . Smoking status: Current Every Day Smoker    Packs/day: 1.50    Years: 44.00    Pack years: 66.00    Types: Cigarettes    Start date: 68  . Smokeless tobacco: Never Used  .  Tobacco comment: 1ppd as of 12/06/17 ep  Substance and Sexual Activity  . Alcohol use: No  . Drug use: No  . Sexual activity: Not on file  Lifestyle  . Physical activity:    Days per week: Not on file    Minutes per session: Not on file  . Stress: Not on file  Relationships  . Social connections:    Talks on phone: Not on file    Gets together: Not on file    Attends religious service: Not on file    Active member of club or organization: Not on file    Attends meetings of clubs or organizations: Not on file    Relationship status: Not on file  . Intimate partner violence:    Fear of current or ex partner: Not on file    Emotionally abused: Not on  file    Physically abused: Not on file    Forced sexual activity: Not on file  Other Topics Concern  . Not on file  Social History Narrative  . Not on file    Physical Exam Pulmonary:     Effort: No respiratory distress.     Breath sounds: Wheezing and rhonchi present. Rales: both upper lobes.  Musculoskeletal:        General: Swelling present.     Right lower leg: No edema.     Left lower leg: No edema.     Comments: Right elbow  Skin:    General: Skin is warm and dry.         Future Appointments  Date Time Provider [place]  07/13/2018  4:00 PM LBCT-CT 1 LBCT-CT [facility]  08/17/2018 10:20 AM [name], MD [facility] None     Wt 138 lb (62.6 kg)   BMI 19.80 kg/m   Weight yesterday-didn't weigh Last visit weight-142  ATF pt CAO x4 standing in the living room, smoking a cigerrete and drinking coffee.  He has a productive cough with clear sputum; pt stated that "its not really thick and it doesn't have a color). He hasn't taken anything for the cough.  He has taken all of his medications for the past week.  He denies sob, although wheezing noted to right lower lobe. He used his inhaler during this visit.  Pt also denies fever, chest pain and dizziness.  He has edema noted to his right elbow, he stated that he will speak with Dr. Deforest Hoyles about a referral to a orthopedic surgeon to remove the fluid.  He stated that the elbow isn't painful; the site isn't hot to touch.    I asked pt if he would like to begin getting two pill boxes and drop the CHP visits down to every two weeks.  He declined, we will revisit the option in a few weeks.    Medication ordered: poitassium bisprolol  Rupinder Livingston, EMT Paramedic [phone] 07/11/2018    ACTION: Home visit completed

## 2018-07-13 ENCOUNTER — Ambulatory Visit (INDEPENDENT_AMBULATORY_CARE_PROVIDER_SITE_OTHER)
Admission: RE | Admit: 2018-07-13 | Discharge: 2018-07-13 | Disposition: A | Discharge status: Home / Self Care | Payer: Medicare Other | Source: Ambulatory Visit | Attending: Acute Care | Admitting: Acute Care

## 2018-07-13 DIAGNOSIS — F17210 Nicotine dependence, cigarettes, uncomplicated: Secondary | ICD-10-CM | POA: Diagnosis not present

## 2018-07-13 DIAGNOSIS — Z122 Encounter for screening for malignant neoplasm of respiratory organs: Secondary | ICD-10-CM

## 2018-07-14 ENCOUNTER — Other Ambulatory Visit: Payer: Self-pay | Admitting: Acute Care

## 2018-07-14 DIAGNOSIS — Z87891 Personal history of nicotine dependence: Secondary | ICD-10-CM

## 2018-07-14 DIAGNOSIS — F17210 Nicotine dependence, cigarettes, uncomplicated: Secondary | ICD-10-CM

## 2018-07-14 DIAGNOSIS — Z122 Encounter for screening for malignant neoplasm of respiratory organs: Secondary | ICD-10-CM

## 2018-07-18 ENCOUNTER — Other Ambulatory Visit (HOSPITAL_COMMUNITY): Payer: Self-pay | Admitting: *Deleted

## 2018-07-18 ENCOUNTER — Telehealth (HOSPITAL_COMMUNITY): Payer: Self-pay

## 2018-07-18 ENCOUNTER — Other Ambulatory Visit (HOSPITAL_COMMUNITY): Payer: Self-pay

## 2018-07-18 NOTE — Progress Notes (Signed)
Paramedicine Encounter    Patient ID: Gerald Nelson, male    DOB: [birth year], 68 y.o.   MRN: [id number]    Patient Care Team: [name], MD as PCP - General (Internal Medicine) [name], LCSW as Social Worker (Licensed Clinical Social Worker)  Patient Active Problem List   Diagnosis Date Noted  . Noncompliance 04/23/2017  . Mitral regurgitation 04/23/2017  . Thrombus - possible apical thrombus 01/2017 04/23/2017  . Hyperlipidemia 04/23/2017  . Pulmonary hypertension ([place])   . SOB (shortness of breath)   . Palliative care by specialist   . Tobacco abuse 04/19/2017  . Acute respiratory failure with hypoxia ([place]) 01/27/2017  . Hypertensive urgency 01/27/2017  . CHF (congestive heart failure) ([place]) 01/26/2017  . CKD (chronic kidney disease), stage III ([place]) 01/04/2017  . Essential hypertension 01/04/2017  . CVA (cerebral infarction) 12/12/2013  . Hyponatremia 12/06/2012  . Hiatal hernia 12/06/2012  . Syncope 12/06/2012  . Schizophrenia ([place])     Current Outpatient Medications:  .  albuterol (PROVENTIL HFA;VENTOLIN HFA) 108 (90 Base) MCG/ACT inhaler, Inhale 1-2 puffs into the lungs every 6 (six) hours as needed for wheezing or shortness of breath., Disp: , Rfl:  .  aspirin 81 MG EC tablet, TAKE 1 TABLET BY MOUTH EVERY DAY, Disp: 30 tablet, Rfl: 1 .  atorvastatin (LIPITOR) 40 MG tablet, TAKE ONE TABLET BY MOUTH EVERY DAY IN THE EVENING, Disp: 30 tablet, Rfl: 6 .  bisoprolol (ZEBETA) 10 MG tablet, Take 1 tablet (10 mg total) by mouth daily., Disp: 30 tablet, Rfl: 3 .  furosemide (LASIX) 40 MG tablet, Take 1 tablet (40 mg total) by mouth daily. If weight goes up 2-3lbs in a day or 3-4lbs in a week, increase to 40mg  in AM AND 20mg  in the PM, Disp: 60 tablet, Rfl: 6 .  isosorbide-hydrALAZINE (BIDIL) 20-37.5 MG tablet, TAKE 2 TABLETS BY MOUTH 3 (THREE) TIMES DAILY.(AM, NOON, BEDTIME), Disp: 180 tablet, Rfl: 6 .  losartan (COZAAR) 25 MG tablet, TAKE ONE TABLET BY MOUTH ONCE DAILY  (AM), Disp: 30 tablet, Rfl: 10 .  potassium chloride SA (K-DUR,KLOR-CON) 20 MEQ tablet, TAKE ONE TABLET BY MOUTH ONCE DAILY (AM), Disp: 30 tablet, Rfl: 6 .  spironolactone (ALDACTONE) 25 MG tablet, TAKE ONE TABLET BY MOUTH ONCE DAILY (BEDTIME), Disp: 30 tablet, Rfl: 5 .  budesonide-formoterol (SYMBICORT) 160-4.5 MCG/ACT inhaler, Inhale 2 puffs into the lungs 2 (two) times daily., Disp: 1 Inhaler, Rfl: 6 .  Tiotropium Bromide Monohydrate (SPIRIVA RESPIMAT) 2.5 MCG/ACT AERS, Inhale 1 spray into the lungs daily., Disp: 1 Inhaler, Rfl: 5 Allergies  Allergen Reactions  . Sulfa Antibiotics Nausea Only     Social History   Socioeconomic History  . Marital status: Single    Spouse name: Not on file  . Number of children: Not on file  . Years of education: Not on file  . Highest education level: Not on file  Occupational History  . Not on file  Social Needs  . Financial resource strain: Not on file  . Food insecurity:    Worry: Not on file    Inability: Not on file  . Transportation needs:    Medical: Not on file    Non-medical: Not on file  Tobacco Use  . Smoking status: Current Every Day Smoker    Packs/day: 1.50    Years: 44.00    Pack years: 66.00    Types: Cigarettes    Start date: 84  . Smokeless tobacco: Never Used  .  Tobacco comment: 1ppd as of 12/06/17 ep  Substance and Sexual Activity  . Alcohol use: No  . Drug use: No  . Sexual activity: Not on file  Lifestyle  . Physical activity:    Days per week: Not on file    Minutes per session: Not on file  . Stress: Not on file  Relationships  . Social connections:    Talks on phone: Not on file    Gets together: Not on file    Attends religious service: Not on file    Active member of club or organization: Not on file    Attends meetings of clubs or organizations: Not on file    Relationship status: Not on file  . Intimate partner violence:    Fear of current or ex partner: Not on file    Emotionally abused: Not on  file    Physically abused: Not on file    Forced sexual activity: Not on file  Other Topics Concern  . Not on file  Social History Narrative  . Not on file    Physical Exam Pulmonary:     Breath sounds: No wheezing or rales.  Abdominal:     General: There is no distension.  Musculoskeletal:        General: No swelling.     Right lower leg: No edema.         Future Appointments  Date Time Provider [place]  08/17/2018 10:20 AM [name], MD [facility] None     BP 114/80 (BP Location: Left Arm, Patient Position: Standing, Cuff Size: Normal)   Pulse 76   Resp 16   Wt 142 lb 9.6 oz (64.7 kg)   BMI 20.46 kg/m   Weight yesterday-didn't weigh Last visit weight-138  ATF pt CAO x4 standing in the living room w/no complaints. He stated that the cough has gotten better since he started taking the cough syrup.  He used his inhaler during our visit @ his scheduled time and dose. He denies sob, chest pain and dizziness. He also denies the need to increase the amount of pillows that he sleeps on at night.  Pt stated that he went to the psychiatrist last week to get the haldol shot.  rx bottles verified and pill box refilled.   Pt stated that he had an interview with a agency to help him find employment yesterday.  He's still smoking a pack of cigarettes a day.    Medication ordered: none  Tysha Grismore, EMT Paramedic [phone] 07/19/2018    ACTION: Home visit completed

## 2018-07-18 NOTE — Telephone Encounter (Signed)
Pt called me to confirm today's [facility] visit.  His appointment is scheduled for 10:30 am today.

## 2018-07-26 ENCOUNTER — Other Ambulatory Visit (HOSPITAL_COMMUNITY): Payer: Self-pay

## 2018-07-26 ENCOUNTER — Telehealth (HOSPITAL_COMMUNITY): Payer: Self-pay

## 2018-07-26 NOTE — Telephone Encounter (Signed)
Pt called to confirm today's CHP visit.  

## 2018-07-26 NOTE — Progress Notes (Signed)
Paramedicine Encounter    Patient ID: Gerald Nelson, male    DOB: [birth year], 68 y.o.   MRN: [id number]    Patient Care Team: [name], MD as PCP - General (Internal Medicine) [name], LCSW as Social Worker (Licensed Clinical Social Worker)  Patient Active Problem List   Diagnosis Date Noted  . Noncompliance 04/23/2017  . Mitral regurgitation 04/23/2017  . Thrombus - possible apical thrombus 01/2017 04/23/2017  . Hyperlipidemia 04/23/2017  . Pulmonary hypertension ([place])   . SOB (shortness of breath)   . Palliative care by specialist   . Tobacco abuse 04/19/2017  . Acute respiratory failure with hypoxia ([place]) 01/27/2017  . Hypertensive urgency 01/27/2017  . CHF (congestive heart failure) ([place]) 01/26/2017  . CKD (chronic kidney disease), stage III ([place]) 01/04/2017  . Essential hypertension 01/04/2017  . CVA (cerebral infarction) 12/12/2013  . Hyponatremia 12/06/2012  . Hiatal hernia 12/06/2012  . Syncope 12/06/2012  . Schizophrenia ([place])     Current Outpatient Medications:  .  aspirin 81 MG EC tablet, TAKE 1 TABLET BY MOUTH EVERY DAY, Disp: 30 tablet, Rfl: 1 .  atorvastatin (LIPITOR) 40 MG tablet, TAKE ONE TABLET BY MOUTH EVERY DAY IN THE EVENING, Disp: 30 tablet, Rfl: 6 .  bisoprolol (ZEBETA) 10 MG tablet, Take 1 tablet (10 mg total) by mouth daily., Disp: 30 tablet, Rfl: 3 .  furosemide (LASIX) 40 MG tablet, Take 1 tablet (40 mg total) by mouth daily. If weight goes up 2-3lbs in a day or 3-4lbs in a week, increase to 40mg  in AM AND 20mg  in the PM, Disp: 60 tablet, Rfl: 6 .  isosorbide-hydrALAZINE (BIDIL) 20-37.5 MG tablet, TAKE 2 TABLETS BY MOUTH 3 (THREE) TIMES DAILY.(AM, NOON, BEDTIME), Disp: 180 tablet, Rfl: 6 .  losartan (COZAAR) 25 MG tablet, TAKE ONE TABLET BY MOUTH ONCE DAILY (AM), Disp: 30 tablet, Rfl: 10 .  potassium chloride SA (K-DUR,KLOR-CON) 20 MEQ tablet, TAKE ONE TABLET BY MOUTH ONCE DAILY (AM), Disp: 30 tablet, Rfl: 6 .  spironolactone (ALDACTONE) 25  MG tablet, TAKE ONE TABLET BY MOUTH ONCE DAILY (BEDTIME), Disp: 30 tablet, Rfl: 5 .  albuterol (PROVENTIL HFA;VENTOLIN HFA) 108 (90 Base) MCG/ACT inhaler, Inhale 1-2 puffs into the lungs every 6 (six) hours as needed for wheezing or shortness of breath., Disp: , Rfl:  .  budesonide-formoterol (SYMBICORT) 160-4.5 MCG/ACT inhaler, Inhale 2 puffs into the lungs 2 (two) times daily., Disp: 1 Inhaler, Rfl: 6 .  Tiotropium Bromide Monohydrate (SPIRIVA RESPIMAT) 2.5 MCG/ACT AERS, Inhale 1 spray into the lungs daily., Disp: 1 Inhaler, Rfl: 5 Allergies  Allergen Reactions  . Sulfa Antibiotics Nausea Only     Social History   Socioeconomic History  . Marital status: Single    Spouse name: Not on file  . Number of children: Not on file  . Years of education: Not on file  . Highest education level: Not on file  Occupational History  . Not on file  Social Needs  . Financial resource strain: Not on file  . Food insecurity:    Worry: Not on file    Inability: Not on file  . Transportation needs:    Medical: Not on file    Non-medical: Not on file  Tobacco Use  . Smoking status: Current Every Day Smoker    Packs/day: 1.50    Years: 44.00    Pack years: 66.00    Types: Cigarettes    Start date: 67  . Smokeless tobacco: Never Used  .  Tobacco comment: 1ppd as of 12/06/17 ep  Substance and Sexual Activity  . Alcohol use: No  . Drug use: No  . Sexual activity: Not on file  Lifestyle  . Physical activity:    Days per week: Not on file    Minutes per session: Not on file  . Stress: Not on file  Relationships  . Social connections:    Talks on phone: Not on file    Gets together: Not on file    Attends religious service: Not on file    Active member of club or organization: Not on file    Attends meetings of clubs or organizations: Not on file    Relationship status: Not on file  . Intimate partner violence:    Fear of current or ex partner: Not on file    Emotionally abused: Not on  file    Physically abused: Not on file    Forced sexual activity: Not on file  Other Topics Concern  . Not on file  Social History Narrative  . Not on file    Physical Exam Pulmonary:     Effort: No respiratory distress.     Breath sounds: No wheezing, rhonchi or rales.  Abdominal:     General: There is no distension.  Musculoskeletal:     Right lower leg: No edema.     Left lower leg: No edema.  Skin:    General: Skin is warm and dry.         Future Appointments  Date Time Provider [place]  08/17/2018 10:20 AM [name], MD [facility] None     BP 118/80 (BP Location: Left Arm, Patient Position: Standing, Cuff Size: Normal)   Pulse 78   Resp 16   Wt 135 lb (61.2 kg)   SpO2 99%   BMI 19.37 kg/m   Weight yesterday-didn't weigh Last visit weight-142  ATF pt CAO x4 standing in the kitchen smoking a cigarette.  He stated that his landlord wanted to me, he gave me permission to discuss his medical hx.  She expressed her concerns about pt smoking 3 packs of cigarettes daily and losing weight.   She also stated he may need home health aide due to him not being able to care for himself as far as personal hygiene and cooking.  Pt agrees.  Pt still has a productive cough (clear sputum); he's still taking mucinex for it PRN.  rx bottles verified and pill box.   Medication ordered: none  Aury Scollard, EMT Paramedic [phone] 07/26/2018    ACTION: Home visit completed

## 2018-08-01 ENCOUNTER — Telehealth (HOSPITAL_COMMUNITY): Payer: Self-pay

## 2018-08-01 ENCOUNTER — Other Ambulatory Visit (HOSPITAL_COMMUNITY): Payer: Self-pay

## 2018-08-01 NOTE — Telephone Encounter (Signed)
Pt called to confirm [facility] visit for this am.

## 2018-08-01 NOTE — Progress Notes (Signed)
Paramedicine Encounter    Patient ID: Gerald Nelson, male    DOB: Feb 22, 1951, 68 y.o.   MRN: [id number]    Patient Care Team: [name], MD as PCP - General (Internal Medicine) [name], LCSW as Social Worker (Licensed Clinical Social Worker)  Patient Active Problem List   Diagnosis Date Noted  . Noncompliance 04/23/2017  . Mitral regurgitation 04/23/2017  . Thrombus - possible apical thrombus 01/2017 04/23/2017  . Hyperlipidemia 04/23/2017  . Pulmonary hypertension ([place])   . SOB (shortness of breath)   . Palliative care by specialist   . Tobacco abuse 04/19/2017  . Acute respiratory failure with hypoxia ([place]) 01/27/2017  . Hypertensive urgency 01/27/2017  . CHF (congestive heart failure) ([place]) 01/26/2017  . CKD (chronic kidney disease), stage III ([place]) 01/04/2017  . Essential hypertension 01/04/2017  . CVA (cerebral infarction) 12/12/2013  . Hyponatremia 12/06/2012  . Hiatal hernia 12/06/2012  . Syncope 12/06/2012  . Schizophrenia ([place])     Current Outpatient Medications:  .  albuterol (PROVENTIL HFA;VENTOLIN HFA) 108 (90 Base) MCG/ACT inhaler, Inhale 1-2 puffs into the lungs every 6 (six) hours as needed for wheezing or shortness of breath., Disp: , Rfl:  .  aspirin 81 MG EC tablet, TAKE 1 TABLET BY MOUTH EVERY DAY, Disp: 30 tablet, Rfl: 1 .  atorvastatin (LIPITOR) 40 MG tablet, TAKE ONE TABLET BY MOUTH EVERY DAY IN THE EVENING, Disp: 30 tablet, Rfl: 6 .  bisoprolol (ZEBETA) 10 MG tablet, Take 1 tablet (10 mg total) by mouth daily., Disp: 30 tablet, Rfl: 3 .  budesonide-formoterol (SYMBICORT) 160-4.5 MCG/ACT inhaler, Inhale 2 puffs into the lungs 2 (two) times daily., Disp: 1 Inhaler, Rfl: 6 .  furosemide (LASIX) 40 MG tablet, Take 1 tablet (40 mg total) by mouth daily. If weight goes up 2-3lbs in a day or 3-4lbs in a week, increase to 40mg  in AM AND 20mg  in the PM, Disp: 60 tablet, Rfl: 6 .  isosorbide-hydrALAZINE (BIDIL) 20-37.5 MG tablet, TAKE 2 TABLETS BY MOUTH 3  (THREE) TIMES DAILY.(AM, NOON, BEDTIME), Disp: 180 tablet, Rfl: 6 .  losartan (COZAAR) 25 MG tablet, TAKE ONE TABLET BY MOUTH ONCE DAILY (AM), Disp: 30 tablet, Rfl: 10 .  potassium chloride SA (K-DUR,KLOR-CON) 20 MEQ tablet, TAKE ONE TABLET BY MOUTH ONCE DAILY (AM), Disp: 30 tablet, Rfl: 6 .  spironolactone (ALDACTONE) 25 MG tablet, TAKE ONE TABLET BY MOUTH ONCE DAILY (BEDTIME), Disp: 30 tablet, Rfl: 5 .  Tiotropium Bromide Monohydrate (SPIRIVA RESPIMAT) 2.5 MCG/ACT AERS, Inhale 1 spray into the lungs daily., Disp: 1 Inhaler, Rfl: 5 Allergies  Allergen Reactions  . Sulfa Antibiotics Nausea Only     Social History   Socioeconomic History  . Marital status: Single    Spouse name: Not on file  . Number of children: Not on file  . Years of education: Not on file  . Highest education level: Not on file  Occupational History  . Not on file  Social Needs  . Financial resource strain: Not on file  . Food insecurity:    Worry: Not on file    Inability: Not on file  . Transportation needs:    Medical: Not on file    Non-medical: Not on file  Tobacco Use  . Smoking status: Current Every Day Smoker    Packs/day: 1.50    Years: 44.00    Pack years: 66.00    Types: Cigarettes    Start date: 96  . Smokeless tobacco: Never Used  .  Tobacco comment: 1ppd as of 12/06/17 ep  Substance and Sexual Activity  . Alcohol use: No  . Drug use: No  . Sexual activity: Not on file  Lifestyle  . Physical activity:    Days per week: Not on file    Minutes per session: Not on file  . Stress: Not on file  Relationships  . Social connections:    Talks on phone: Not on file    Gets together: Not on file    Attends religious service: Not on file    Active member of club or organization: Not on file    Attends meetings of clubs or organizations: Not on file    Relationship status: Not on file  . Intimate partner violence:    Fear of current or ex partner: Not on file    Emotionally abused: Not on  file    Physically abused: Not on file    Forced sexual activity: Not on file  Other Topics Concern  . Not on file  Social History Narrative  . Not on file    Physical Exam Pulmonary:     Effort: No respiratory distress.     Breath sounds: No wheezing or rales.  Abdominal:     General: There is no distension.  Musculoskeletal:     Right lower leg: No edema.     Left lower leg: No edema.  Skin:    General: Skin is warm and dry.         Future Appointments  Date Time Provider [place]  08/08/2018  9:00 AM [name], Legrand Como, MD [facility] None  08/17/2018 10:20 AM [name], MD [facility] None     BP 104/60 (BP Location: Left Arm, Patient Position: Sitting, Cuff Size: Normal)   Pulse 89   Resp 20   Wt 136 lb (61.7 kg)   SpO2 99%   BMI 19.51 kg/m   Weight yesterday-136 Last visit weight-135  ATF pt CAO x4 standing in the kitchen talking on the phone with his landlord. He stated that he had a upcoming appointment with his pcp, his landlord want to talk with a doctor "about getting him a aide to help him with hygiene".  Pt is still smoking about 3 packs of cigarettes daily.  He still has a productive cough, he denies fever and chest pain.  He also denies needing to add additional pillows to sleep at night.  rx bottles verified and pill box refilled.     Medication ordered: bisporolol 10 mg  [name], EMT Paramedic [phone] 08/01/2018    ACTION: Home visit completed

## 2018-08-08 ENCOUNTER — Telehealth (HOSPITAL_COMMUNITY): Payer: Self-pay

## 2018-08-08 ENCOUNTER — Other Ambulatory Visit (HOSPITAL_COMMUNITY): Payer: Self-pay

## 2018-08-08 ENCOUNTER — Ambulatory Visit (INDEPENDENT_AMBULATORY_CARE_PROVIDER_SITE_OTHER): Payer: Medicare Other | Admitting: Family Medicine

## 2018-08-08 NOTE — Progress Notes (Signed)
Paramedicine Encounter    Patient ID: Gerald Nelson, male    DOB: 1951-02-08, 68 y.o.   MRN: [id number]    Patient Care Team: [name], MD as PCP - General (Internal Medicine) [name], LCSW as Social Worker (Licensed Clinical Social Worker)  Patient Active Problem List   Diagnosis Date Noted  . Noncompliance 04/23/2017  . Mitral regurgitation 04/23/2017  . Thrombus - possible apical thrombus 01/2017 04/23/2017  . Hyperlipidemia 04/23/2017  . Pulmonary hypertension ([place])   . SOB (shortness of breath)   . Palliative care by specialist   . Tobacco abuse 04/19/2017  . Acute respiratory failure with hypoxia ([place]) 01/27/2017  . Hypertensive urgency 01/27/2017  . CHF (congestive heart failure) ([place]) 01/26/2017  . CKD (chronic kidney disease), stage III ([place]) 01/04/2017  . Essential hypertension 01/04/2017  . CVA (cerebral infarction) 12/12/2013  . Hyponatremia 12/06/2012  . Hiatal hernia 12/06/2012  . Syncope 12/06/2012  . Schizophrenia ([place])     Current Outpatient Medications:  .  aspirin 81 MG EC tablet, TAKE 1 TABLET BY MOUTH EVERY DAY, Disp: 30 tablet, Rfl: 1 .  bisoprolol (ZEBETA) 10 MG tablet, Take 1 tablet (10 mg total) by mouth daily., Disp: 30 tablet, Rfl: 3 .  furosemide (LASIX) 40 MG tablet, Take 1 tablet (40 mg total) by mouth daily. If weight goes up 2-3lbs in a day or 3-4lbs in a week, increase to 40mg  in AM AND 20mg  in the PM, Disp: 60 tablet, Rfl: 6 .  isosorbide-hydrALAZINE (BIDIL) 20-37.5 MG tablet, TAKE 2 TABLETS BY MOUTH 3 (THREE) TIMES DAILY.(AM, NOON, BEDTIME), Disp: 180 tablet, Rfl: 6 .  losartan (COZAAR) 25 MG tablet, TAKE ONE TABLET BY MOUTH ONCE DAILY (AM), Disp: 30 tablet, Rfl: 10 .  spironolactone (ALDACTONE) 25 MG tablet, TAKE ONE TABLET BY MOUTH ONCE DAILY (BEDTIME), Disp: 30 tablet, Rfl: 5 .  albuterol (PROVENTIL HFA;VENTOLIN HFA) 108 (90 Base) MCG/ACT inhaler, Inhale 1-2 puffs into the lungs every 6 (six) hours as needed for wheezing or  shortness of breath., Disp: , Rfl:  .  atorvastatin (LIPITOR) 40 MG tablet, TAKE ONE TABLET BY MOUTH EVERY DAY IN THE EVENING, Disp: 30 tablet, Rfl: 6 .  budesonide-formoterol (SYMBICORT) 160-4.5 MCG/ACT inhaler, Inhale 2 puffs into the lungs 2 (two) times daily., Disp: 1 Inhaler, Rfl: 6 .  potassium chloride SA (K-DUR,KLOR-CON) 20 MEQ tablet, TAKE ONE TABLET BY MOUTH ONCE DAILY (AM), Disp: 30 tablet, Rfl: 6 .  Tiotropium Bromide Monohydrate (SPIRIVA RESPIMAT) 2.5 MCG/ACT AERS, Inhale 1 spray into the lungs daily., Disp: 1 Inhaler, Rfl: 5 Allergies  Allergen Reactions  . Sulfa Antibiotics Nausea Only     Social History   Socioeconomic History  . Marital status: Single    Spouse name: Not on file  . Number of children: Not on file  . Years of education: Not on file  . Highest education level: Not on file  Occupational History  . Not on file  Social Needs  . Financial resource strain: Not on file  . Food insecurity:    Worry: Not on file    Inability: Not on file  . Transportation needs:    Medical: Not on file    Non-medical: Not on file  Tobacco Use  . Smoking status: Current Every Day Smoker    Packs/day: 1.50    Years: 44.00    Pack years: 66.00    Types: Cigarettes    Start date: 20  . Smokeless tobacco: Never Used  .  Tobacco comment: 1ppd as of 12/06/17 ep  Substance and Sexual Activity  . Alcohol use: No  . Drug use: No  . Sexual activity: Not on file  Lifestyle  . Physical activity:    Days per week: Not on file    Minutes per session: Not on file  . Stress: Not on file  Relationships  . Social connections:    Talks on phone: Not on file    Gets together: Not on file    Attends religious service: Not on file    Active member of club or organization: Not on file    Attends meetings of clubs or organizations: Not on file    Relationship status: Not on file  . Intimate partner violence:    Fear of current or ex partner: Not on file    Emotionally abused:  Not on file    Physically abused: Not on file    Forced sexual activity: Not on file  Other Topics Concern  . Not on file  Social History Narrative  . Not on file    Physical Exam Pulmonary:     Effort: Pulmonary effort is normal. No respiratory distress.     Breath sounds: Wheezing present. No rales.     Comments: Expiratory wheeze in upper right lobe        Future Appointments  Date Time Provider [place]  08/16/2018 10:20 AM [name], Legrand Como, MD [facility] None  08/17/2018 10:20 AM [name], MD [facility] None     BP 110/70 (BP Location: Left Arm, Patient Position: Sitting, Cuff Size: Normal)   Pulse 88   Resp 16   Wt 140 lb (63.5 kg)   SpO2 97%   BMI 20.09 kg/m   Weight yesterday-138 Last visit weight-136  ATF pt CAO x4 standing in the living room smoking a cigarette.  He stated that he took his medications this morning; all of his meds for this week was taken.  Pt stated that he went to his pcp with his "landlord" yesterday, he stated that his landlord was trying to start the process of becoming his medical POA.  It's unclear the reason at this time.  Pt denies chest pain, dizziness and sob (increase). Pt does have a expiratory wheeze in the right upper lobe, he stated that he hasn't used his inhaler yet.  rx bottles verified and pill box refilled.    Medication ordered: Bisoprolol Losartan  Wymon Swaney, EMT Paramedic [phone] 08/11/2018    ACTION: Home visit completed

## 2018-08-08 NOTE — Telephone Encounter (Signed)
I called pt to confirm today's [facility] visit @ 230.

## 2018-08-10 ENCOUNTER — Ambulatory Visit (INDEPENDENT_AMBULATORY_CARE_PROVIDER_SITE_OTHER): Payer: Medicare Other | Admitting: Family Medicine

## 2018-08-11 ENCOUNTER — Other Ambulatory Visit: Payer: Self-pay | Admitting: Student

## 2018-08-16 ENCOUNTER — Ambulatory Visit (INDEPENDENT_AMBULATORY_CARE_PROVIDER_SITE_OTHER): Payer: Medicare Other | Admitting: Family Medicine

## 2018-08-16 ENCOUNTER — Encounter (INDEPENDENT_AMBULATORY_CARE_PROVIDER_SITE_OTHER): Payer: Self-pay | Admitting: Family Medicine

## 2018-08-16 DIAGNOSIS — M7021 Olecranon bursitis, right elbow: Secondary | ICD-10-CM | POA: Diagnosis not present

## 2018-08-16 MED ORDER — METHYLPREDNISOLONE ACETATE 40 MG/ML IJ SUSP: 40.0000 mg | Freq: Once | INTRAMUSCULAR | Status: DC

## 2018-08-16 NOTE — Progress Notes (Signed)
Office Visit Note   Patient: Gerald Nelson           Date of Birth: Dec 04, 1950           MRN: [id number] Visit Date: 08/16/2018 Requested by: [name], MD [address] [place], [place] [address] PCP: [name], MD  Subjective: Chief Complaint  Patient presents with  . Right Elbow - Bursitis    Has 2 swollen areas - was just 1 before.    HPI: He is here with recurrent right elbow swelling.  We aspirated and injected his olecranon bursa in September.  It went away for a couple months but then it came back again, this time bigger than before.  It remains painless.  No fevers or chills.              ROS: Noncontributory  Objective: Vital Signs: There were no vitals taken for this visit.  Physical Exam:  Right elbow: He has a very large olecranon bursa with no warmth or erythema, no tenderness to palpation.  Imaging: None today.  Assessment & Plan: 1.  Recurrent olecranon bursitis, not infected -Discussed options with him and elected to try one more aspiration and injection.  If it continues to recur, then possibly surgical consult.   Follow-Up Instructions: No follow-ups on file.      Procedures: Right elbow aspiration and injection: After sterile prep with Betadine, injected 3 cc 1% lidocaine without epinephrine, then aspirated 55 cc clear yellow synovial fluid, then injected 40 mg methylprednisolone.  Ace wrap for compression.   PMFS History: Patient Active Problem List   Diagnosis Date Noted  . Noncompliance 04/23/2017  . Mitral regurgitation 04/23/2017  . Thrombus - possible apical thrombus 01/2017 04/23/2017  . Hyperlipidemia 04/23/2017  . Pulmonary hypertension ([place])   . SOB (shortness of breath)   . Palliative care by specialist   . Tobacco abuse 04/19/2017  . Acute respiratory failure with hypoxia ([place]) 01/27/2017  . Hypertensive urgency 01/27/2017  . CHF (congestive heart failure) ([place]) 01/26/2017  . CKD (chronic kidney disease),  stage III ([place]) 01/04/2017  . Essential hypertension 01/04/2017  . CVA (cerebral infarction) 12/12/2013  . Hyponatremia 12/06/2012  . Hiatal hernia 12/06/2012  . Syncope 12/06/2012  . Schizophrenia [facility])    Past Medical History:  Diagnosis Date  . Apical mural thrombus    a. question of apical thrombus on echo 01/2017, pt left AMA, not felt to be anticoag candidate with noncompliance.  . Arthritis   . CHF (congestive heart failure) ([place])   . Chronic systolic CHF (congestive heart failure) ([place])    a. pt refused cath. EF 15% 01/2017.  . CKD (chronic kidney disease), stage III ([place])   . COPD (chronic obstructive pulmonary disease) ([place])   . Hernia, hiatal   . Hyperlipidemia   . Hypertension   . Hyponatremia   . Mitral regurgitation    a. mod-severe by echo 01/2017.  . Pulmonary hypertension ([place])   . Schizophrenia ([place])   . Tobacco abuse     Family History  Problem Relation Age of Onset  . Hypertension Mother   . Hypertension Father     Past Surgical History:  Procedure Laterality Date  . PENILE PROSTHESIS IMPLANT     Social History   Occupational History  . Not on file  Tobacco Use  . Smoking status: Current Every Day Smoker    Packs/day: 1.50    Years: 44.00    Pack years: 66.00  Types: Cigarettes    Start date: 66  . Smokeless tobacco: Never Used  . Tobacco comment: 1ppd as of 12/06/17 ep  Substance and Sexual Activity  . Alcohol use: No  . Drug use: No  . Sexual activity: Not on file

## 2018-08-17 ENCOUNTER — Other Ambulatory Visit (HOSPITAL_COMMUNITY): Payer: Self-pay

## 2018-08-17 ENCOUNTER — Other Ambulatory Visit: Payer: Self-pay

## 2018-08-17 ENCOUNTER — Ambulatory Visit (HOSPITAL_COMMUNITY)
Admission: RE | Admit: 2018-08-17 | Discharge: 2018-08-17 | Disposition: A | Discharge status: Home / Self Care | Payer: Medicare Other | Source: Ambulatory Visit | Attending: Cardiology | Admitting: Cardiology

## 2018-08-17 VITALS — HR 71 | Wt 130.8 lb

## 2018-08-17 DIAGNOSIS — Z79899 Other long term (current) drug therapy: Secondary | ICD-10-CM | POA: Insufficient documentation

## 2018-08-17 DIAGNOSIS — I130 Hypertensive heart and chronic kidney disease with heart failure and stage 1 through stage 4 chronic kidney disease, or unspecified chronic kidney disease: Secondary | ICD-10-CM | POA: Diagnosis not present

## 2018-08-17 DIAGNOSIS — F209 Schizophrenia, unspecified: Secondary | ICD-10-CM | POA: Insufficient documentation

## 2018-08-17 DIAGNOSIS — I513 Intracardiac thrombosis, not elsewhere classified: Secondary | ICD-10-CM | POA: Diagnosis not present

## 2018-08-17 DIAGNOSIS — I340 Nonrheumatic mitral (valve) insufficiency: Secondary | ICD-10-CM | POA: Diagnosis not present

## 2018-08-17 DIAGNOSIS — I272 Pulmonary hypertension, unspecified: Secondary | ICD-10-CM | POA: Diagnosis not present

## 2018-08-17 DIAGNOSIS — Z7982 Long term (current) use of aspirin: Secondary | ICD-10-CM | POA: Insufficient documentation

## 2018-08-17 DIAGNOSIS — Z720 Tobacco use: Secondary | ICD-10-CM

## 2018-08-17 DIAGNOSIS — I5022 Chronic systolic (congestive) heart failure: Secondary | ICD-10-CM | POA: Diagnosis not present

## 2018-08-17 DIAGNOSIS — Z882 Allergy status to sulfonamides status: Secondary | ICD-10-CM | POA: Insufficient documentation

## 2018-08-17 DIAGNOSIS — I5023 Acute on chronic systolic (congestive) heart failure: Secondary | ICD-10-CM | POA: Diagnosis present

## 2018-08-17 DIAGNOSIS — J449 Chronic obstructive pulmonary disease, unspecified: Secondary | ICD-10-CM | POA: Insufficient documentation

## 2018-08-17 DIAGNOSIS — I2510 Atherosclerotic heart disease of native coronary artery without angina pectoris: Secondary | ICD-10-CM | POA: Insufficient documentation

## 2018-08-17 DIAGNOSIS — N183 Chronic kidney disease, stage 3 (moderate): Secondary | ICD-10-CM | POA: Diagnosis not present

## 2018-08-17 DIAGNOSIS — F17210 Nicotine dependence, cigarettes, uncomplicated: Secondary | ICD-10-CM | POA: Diagnosis not present

## 2018-08-17 DIAGNOSIS — Z8249 Family history of ischemic heart disease and other diseases of the circulatory system: Secondary | ICD-10-CM | POA: Diagnosis not present

## 2018-08-17 LAB — BASIC METABOLIC PANEL
ANION GAP: 6 (ref 5–15)
BUN: 23 mg/dL (ref 8–23)
CHLORIDE: 107 mmol/L (ref 98–111)
CO2: 25 mmol/L (ref 22–32)
Calcium: 9.1 mg/dL (ref 8.9–10.3)
Creatinine, Ser: 1.68 mg/dL — ABNORMAL HIGH (ref 0.61–1.24)
GFR calc Af Amer: 49 mL/min — ABNORMAL LOW (ref >60)
GFR, EST NON AFRICAN AMERICAN: 42 mL/min — AB (ref >60)
GLUCOSE: 95 mg/dL (ref 70–99)
Potassium: 4.5 mmol/L (ref 3.5–5.1)
Sodium: 138 mmol/L (ref 135–145)

## 2018-08-17 NOTE — Patient Instructions (Signed)
CALL us when you are ready to schedule your Heart Catheterization  Labs today We will only contact you if something comes back abnormal or we need to make some changes. Otherwise no news is good news!  Your physician recommends that you schedule a follow-up appointment in: 2 months with Dr. Aundra Dubin with an ECHO before visit.

## 2018-08-17 NOTE — Progress Notes (Signed)
Paramedicine Encounter   Patient ID: Gerald Nelson , male,   DOB: 01-Feb-1951,67 y.o.,  MRN: [id number]   Met patient in clinic today Upon my arrival pt had already seen Dr. Aundra Dubin. He stated that there's no medication change and he encourages pt to get the pacemaker placed. Pt still has concerns about the procedure, so nothing is scheduled at this time.   Time spent with patient 15 mins   [place], [place] 08/17/2018   ACTION: Home visit completed

## 2018-08-18 NOTE — Progress Notes (Signed)
[facility] Note    Primary Care: [name]   Cardiology: Dr. Aundra Dubin   HPI: Gerald Nelson is a 68 y.o. male with a past medical history of chronic systolic CHF (EF 87% in July 2018), severe MR/TR, pulmonary HTN, CVA, CKD, schizophrenia, HTN and tobacco abuse.   [name] was admitted to [facility] 7/2-01/06/17 for newly diagnosed acute systolic CHF. Echo showed LVEF 15%, severe global hypokinesis, moderate LVH, coarsetrabeculation of the LV apex with numerous false tendinae of the left ventricle, very stagnant blood flow at the LV apex withsmoke but no obvious LV thrombus - Definity contrast was given,again, noting stagnant apical blood flow - this could suggestrecent thrombus and certainly high risk for apical thrombusformation. Other findings include aortic sclerosis with mild AI,moderate to severe MR, moderate LAE, mild RAE, moderate to severeTR, moderate to severe pulmonary hypertension (RVSP 73 mmHg), dilated IVC, trivial posterior pericardial effusion.   He was admitted again in 10/18. He diuresed 25 pounds on IV lasix. He refused cath. His clonidine and diltazem were stopped during this admission. Referred to paramedicine. Discharge weight 133 pounds.   Echo 08/11/17 LVEF 15-20%, no MR noted.   Patient presents for followup of CHF.  He continues to smoke but now < 1 ppd.  He is followed by paramedicine.  No chest pain, no exertional dyspnea with normal activities. He can walk to the bus stop without problems.  No orthopnea/PND.    ECG (personally reviewed): NSR, LVH, old ASMI  Labs (6/19): K 3.8, creatinine 2.2 Labs (11/19): K 4.1, creatinine 1.95, LDL 54, HDl 54 Labs (12/19): K 4.6, creatinine 1.89  Review of systems complete and found to be negative unless listed in HPI.   PMH: 1. Chronic systolic CHF:  Cardiomyopathy of uncertain etiology, refused cath. He also refused ICD.  - Echo (7/18): Moderate LVH, severe FBSH, trabeculation at apex not meeting criteria  for noncompaction, EF 15%, moderate to severe MR, low normal RV systolic function, moderate-severe TR.  - Echo (2/19): Moderate LVH, EF 15-20%, No mitral regurgitation, normal RV size and systolic function, mild TR.  2. H/o CVA 3. Schizophrenia 4. CKD stage 3 5. Mitral regurgitation: Suspect functional. Moderate-severe MR on 7/18 echo, minimal MR on 2/19 echo.  6. COPD: He is an active smoker.  - PFTs (12/18) suggestive of severe COPD.  7. H/o HTN 8. ? apical mural thrombus: Not seen on most recent echo in 2/19.  9. CAD: 1/19 CT chest showed coronary calcification.    Current Outpatient Medications  Medication Sig Dispense Refill  . albuterol (PROVENTIL HFA;VENTOLIN HFA) 108 (90 Base) MCG/ACT inhaler Inhale 1-2 puffs into the lungs every 6 (six) hours as needed for wheezing or shortness of breath.    Marland Kitchen aspirin 81 MG EC tablet TAKE 1 TABLET BY MOUTH EVERY DAY 30 tablet 1  . atorvastatin (LIPITOR) 40 MG tablet TAKE ONE TABLET BY MOUTH EVERY DAY IN THE EVENING 30 tablet 6  . bisoprolol (ZEBETA) 10 MG tablet Take 1 tablet (10 mg total) by mouth daily. 30 tablet 3  . furosemide (LASIX) 40 MG tablet Take 1 tablet (40 mg total) by mouth daily. If weight goes up 2-3lbs in a day or 3-4lbs in a week, increase to 40mg  in AM AND 20mg  in the PM 60 tablet 6  . haloperidol decanoate (HALDOL DECANOATE) 100 MG/ML injection INJECT 1 ML EVERY 4 WEEKS    . isosorbide-hydrALAZINE (BIDIL) 20-37.5 MG tablet TAKE 2 TABLETS BY MOUTH 3 (THREE) TIMES  DAILY.(AM, NOON, BEDTIME) 180 tablet 6  . losartan (COZAAR) 25 MG tablet TAKE ONE TABLET BY MOUTH ONCE DAILY (AM) 30 tablet 10  . potassium chloride SA (K-DUR,KLOR-CON) 20 MEQ tablet TAKE ONE TABLET BY MOUTH ONCE DAILY (AM) 30 tablet 6  . spironolactone (ALDACTONE) 25 MG tablet TAKE ONE TABLET BY MOUTH ONCE DAILY (BEDTIME) 30 tablet 5  . Tiotropium Bromide Monohydrate (SPIRIVA RESPIMAT) 2.5 MCG/ACT AERS Inhale 1 spray into the lungs daily. 1 Inhaler 5   Current  Facility-Administered Medications  Medication Dose Route Frequency Provider Last Rate Last Dose  . methylPREDNISolone acetate (DEPO-MEDROL) injection 40 mg  40 mg Intra-articular Once [name], Legrand Como, MD       Allergies  Allergen Reactions  . Sulfa Antibiotics Nausea Only   Social History   Socioeconomic History  . Marital status: Single    Spouse name: Not on file  . Number of children: Not on file  . Years of education: Not on file  . Highest education level: Not on file  Occupational History  . Not on file  Social Needs  . Financial resource strain: Not on file  . Food insecurity:    Worry: Not on file    Inability: Not on file  . Transportation needs:    Medical: Not on file    Non-medical: Not on file  Tobacco Use  . Smoking status: Current Every Day Smoker    Packs/day: 1.50    Years: 44.00    Pack years: 66.00    Types: Cigarettes    Start date: 70  . Smokeless tobacco: Never Used  . Tobacco comment: 1ppd as of 12/06/17 ep  Substance and Sexual Activity  . Alcohol use: No  . Drug use: No  . Sexual activity: Not on file  Lifestyle  . Physical activity:    Days per week: Not on file    Minutes per session: Not on file  . Stress: Not on file  Relationships  . Social connections:    Talks on phone: Not on file    Gets together: Not on file    Attends religious service: Not on file    Active member of club or organization: Not on file    Attends meetings of clubs or organizations: Not on file    Relationship status: Not on file  . Intimate partner violence:    Fear of current or ex partner: Not on file    Emotionally abused: Not on file    Physically abused: Not on file    Forced sexual activity: Not on file  Other Topics Concern  . Not on file  Social History Narrative  . Not on file      Family History  Problem Relation Age of Onset  . Hypertension Mother   . Hypertension Father    Vitals:   08/17/18 1029  Pulse: 71  SpO2: 97%  Weight: 59.3  kg (130 lb 12.8 oz)   Wt Readings from Last 3 Encounters:  08/17/18 59.3 kg (130 lb 12.8 oz)  08/08/18 63.5 kg (140 lb)  08/01/18 61.7 kg (136 lb)   PHYSICAL EXAM: General: NAD Neck: No JVD, no thyromegaly or thyroid nodule.  Lungs: Distant breath sounds with rhonchi.  CV: Nondisplaced PMI.  Heart regular S1/S2, no S3/S4, no murmur.  No peripheral edema.  No carotid bruit.  Normal pedal pulses.  Abdomen: Soft, nontender, no hepatosplenomegaly, no distention.  Skin: Intact without lesions or rashes.  Neurologic: Alert and oriented x 3.  Psych: Normal affect. Extremities: No clubbing or cyanosis.  HEENT: Normal.   ASSESSMENT & PLAN: 1. Chronic systolic CHF: Echo 11/1698 EF 15% with moderate-severe MR. Echo 08/11/17 LVEF 15-20%, no MR noted.  Cardiomyopathy of uncertain etiology, he has refused cath in the past.  NYHA class II symptoms.  He is not significantly volume overloaded on exam today.  Creatinine 1.89 at last check.  - We discussed left/right heart cath today for assessment for CAD.  I am concerned that he has an ischemic cardiomyopathy, and it is possible that LV function could improve with revascularization. He is still thinking about whether he wants a cath.  Creatinine < 2 now so think cath would be feasible.  He will call me to let me know his decision.   - Continue Lasix 40 mg daily. BMET today.  - Continue bisoprolol 10 mg daily.   - Continue losartan 25 mg daily.  - Continue spironolactone 25 mg daily.  - Continue Bidil 2 tabs tid.  - I will arrange for repeat echo.  - He saw EP and refused ICD.  He is not a candidate for CRT.  2. Questionable apical thrombus: Not seen on most recent echo in 2/19. He is not anticoagulated.  3. Mitral regurgitation: Moderate to severe on 7/18 echo but minimal on 2/19 echo.  Suspect functional MR.  No significant murmur on exam today.  4. COPD: He continues to smoke.  Severe COPD by prior PFTs.  - I again encourage him to quit.  5. CKD  stage III: Repeat BMET today.  6. Schizophrenia: Affects compliance with medical care.  Continue paramedicine, this seems to be working well.  7. CAD: Noted on CT chest.  Concern for ischemic cardiomyopathy as above.  - Continue ASA 81.  - Continue atorvastatin, good LDL in 11/19.  - As above, recommended cath, patient still thinking about it.   Followup in 2 months.   Loralie Champagne 08/18/2018

## 2018-08-22 ENCOUNTER — Telehealth (HOSPITAL_COMMUNITY): Payer: Self-pay

## 2018-08-22 ENCOUNTER — Other Ambulatory Visit (HOSPITAL_COMMUNITY): Payer: Self-pay

## 2018-08-22 NOTE — Progress Notes (Signed)
Paramedicine Encounter    Patient ID: Gerald Nelson, male    DOB: June 22, 1951, 68 y.o.   MRN: [id number]    Patient Care Team: [name], MD as PCP - General (Internal Medicine) [name], LCSW as Social Worker (Licensed Clinical Social Worker)  Patient Active Problem List   Diagnosis Date Noted  . Noncompliance 04/23/2017  . Mitral regurgitation 04/23/2017  . Thrombus - possible apical thrombus 01/2017 04/23/2017  . Hyperlipidemia 04/23/2017  . Pulmonary hypertension ([place])   . SOB (shortness of breath)   . Palliative care by specialist   . Tobacco abuse 04/19/2017  . Acute respiratory failure with hypoxia ([place]) 01/27/2017  . Hypertensive urgency 01/27/2017  . CHF (congestive heart failure) ([place]) 01/26/2017  . CKD (chronic kidney disease), stage III ([place]) 01/04/2017  . Essential hypertension 01/04/2017  . CVA (cerebral infarction) 12/12/2013  . Hyponatremia 12/06/2012  . Hiatal hernia 12/06/2012  . Syncope 12/06/2012  . Schizophrenia ([place])     Current Outpatient Medications:  .  albuterol (PROVENTIL HFA;VENTOLIN HFA) 108 (90 Base) MCG/ACT inhaler, Inhale 1-2 puffs into the lungs every 6 (six) hours as needed for wheezing or shortness of breath., Disp: , Rfl:  .  aspirin 81 MG EC tablet, TAKE 1 TABLET BY MOUTH EVERY DAY, Disp: 30 tablet, Rfl: 1 .  atorvastatin (LIPITOR) 40 MG tablet, TAKE ONE TABLET BY MOUTH EVERY DAY IN THE EVENING, Disp: 30 tablet, Rfl: 6 .  bisoprolol (ZEBETA) 10 MG tablet, Take 1 tablet (10 mg total) by mouth daily., Disp: 30 tablet, Rfl: 3 .  furosemide (LASIX) 40 MG tablet, Take 1 tablet (40 mg total) by mouth daily. If weight goes up 2-3lbs in a day or 3-4lbs in a week, increase to 40mg  in AM AND 20mg  in the PM, Disp: 60 tablet, Rfl: 6 .  haloperidol decanoate (HALDOL DECANOATE) 100 MG/ML injection, INJECT 1 ML EVERY 4 WEEKS, Disp: , Rfl:  .  isosorbide-hydrALAZINE (BIDIL) 20-37.5 MG tablet, TAKE 2 TABLETS BY MOUTH 3 (THREE) TIMES DAILY.(AM, NOON,  BEDTIME), Disp: 180 tablet, Rfl: 6 .  losartan (COZAAR) 25 MG tablet, TAKE ONE TABLET BY MOUTH ONCE DAILY (AM), Disp: 30 tablet, Rfl: 10 .  potassium chloride SA (K-DUR,KLOR-CON) 20 MEQ tablet, TAKE ONE TABLET BY MOUTH ONCE DAILY (AM), Disp: 30 tablet, Rfl: 6 .  spironolactone (ALDACTONE) 25 MG tablet, TAKE ONE TABLET BY MOUTH ONCE DAILY (BEDTIME), Disp: 30 tablet, Rfl: 5 .  Tiotropium Bromide Monohydrate (SPIRIVA RESPIMAT) 2.5 MCG/ACT AERS, Inhale 1 spray into the lungs daily., Disp: 1 Inhaler, Rfl: 5  Current Facility-Administered Medications:  .  methylPREDNISolone acetate (DEPO-MEDROL) injection 40 mg, 40 mg, Intra-articular, Once, [name], [name], MD Allergies  Allergen Reactions  . Sulfa Antibiotics Nausea Only     Social History   Socioeconomic History  . Marital status: Single    Spouse name: Not on file  . Number of children: Not on file  . Years of education: Not on file  . Highest education level: Not on file  Occupational History  . Not on file  Social Needs  . Financial resource strain: Not on file  . Food insecurity:    Worry: Not on file    Inability: Not on file  . Transportation needs:    Medical: Not on file    Non-medical: Not on file  Tobacco Use  . Smoking status: Current Every Day Smoker    Packs/day: 1.50    Years: 44.00    Pack years: 66.00  Types: Cigarettes    Start date: 109  . Smokeless tobacco: Never Used  . Tobacco comment: 1ppd as of 12/06/17 ep  Substance and Sexual Activity  . Alcohol use: No  . Drug use: No  . Sexual activity: Not on file  Lifestyle  . Physical activity:    Days per week: Not on file    Minutes per session: Not on file  . Stress: Not on file  Relationships  . Social connections:    Talks on phone: Not on file    Gets together: Not on file    Attends religious service: Not on file    Active member of club or organization: Not on file    Attends meetings of clubs or organizations: Not on file    Relationship  status: Not on file  . Intimate partner violence:    Fear of current or ex partner: Not on file    Emotionally abused: Not on file    Physically abused: Not on file    Forced sexual activity: Not on file  Other Topics Concern  . Not on file  Social History Narrative  . Not on file    Physical Exam Pulmonary:     Effort: No respiratory distress.     Breath sounds: Wheezing present. No rales.     Comments: Both right lobes Abdominal:     General: There is no distension.  Musculoskeletal:     Right lower leg: No edema.     Left lower leg: No edema.  Skin:    General: Skin is warm and dry.         Future Appointments  Date Time Provider [place]  10/12/2018 10:40 AM [name], MD [facility] None     BP 130/86 (BP Location: Left Arm, Patient Position: Standing, Cuff Size: Normal)   Pulse 99   Resp 20   Wt 136 lb 11.2 oz (62 kg)   SpO2 97%   BMI 19.61 kg/m   Weight yesterday-didn't weigh Last visit weight-140  ATF pt CAO x4 laying on the chair sleeping.  He appears very tired today but denies dizziness, sob and chest pain.  Pt has a non-productive cough for almost a month.  He's smokes about 2 pks of cigarettes daily.  He has taken all of his medications this week.  rx bottles verified and pill box refilled.    I called the RN with Dr. Deforest Hoyles office about pt's cough today.  I also have concerns about his mental health.  He has hx of and . His clothes is very dirty today, which is not his normal. he became anxious while we talk directly to each other. He did get the haldol shot earlier this month as prescribed.  I left a message on the RN vm for a call back.     Medication ordered: none  Kailyn Vanderslice, EMT Paramedic [phone] 08/22/2018    ACTION: Home visit completed

## 2018-08-22 NOTE — Telephone Encounter (Signed)
I called pt to confirm today's CHP visit 

## 2018-08-29 ENCOUNTER — Telehealth (HOSPITAL_COMMUNITY): Payer: Self-pay

## 2018-08-29 ENCOUNTER — Other Ambulatory Visit (HOSPITAL_COMMUNITY): Payer: Self-pay

## 2018-08-29 NOTE — Telephone Encounter (Signed)
[name] called me to confirm today's CHP visit.

## 2018-08-29 NOTE — Progress Notes (Signed)
Paramedicine Encounter    Patient ID: Gerald Nelson, male    DOB: 28-Feb-1951, 68 y.o.   MRN: [id number]    Patient Care Team: Gerald Low, MD as PCP - General (Internal Medicine) Gerald Ny, LCSW as Social Worker (Licensed Clinical Social Worker)  Patient Active Problem List   Diagnosis Date Noted  . Noncompliance 04/23/2017  . Mitral regurgitation 04/23/2017  . Thrombus - possible apical thrombus 01/2017 04/23/2017  . Hyperlipidemia 04/23/2017  . Pulmonary hypertension ([place])   . SOB (shortness of breath)   . Palliative care by specialist   . Tobacco abuse 04/19/2017  . Acute respiratory failure with hypoxia ([place]) 01/27/2017  . Hypertensive urgency 01/27/2017  . CHF (congestive heart failure) ([place]) 01/26/2017  . CKD (chronic kidney disease), stage III ([place]) 01/04/2017  . Essential hypertension 01/04/2017  . CVA (cerebral infarction) 12/12/2013  . Hyponatremia 12/06/2012  . Hiatal hernia 12/06/2012  . Syncope 12/06/2012  . Schizophrenia ([place])     Current Outpatient Medications:  .  aspirin 81 MG EC tablet, TAKE 1 TABLET BY MOUTH EVERY DAY, Disp: 30 tablet, Rfl: 1 .  atorvastatin (LIPITOR) 40 MG tablet, TAKE ONE TABLET BY MOUTH EVERY DAY IN THE EVENING, Disp: 30 tablet, Rfl: 6 .  bisoprolol (ZEBETA) 10 MG tablet, Take 1 tablet (10 mg total) by mouth daily., Disp: 30 tablet, Rfl: 3 .  furosemide (LASIX) 40 MG tablet, Take 1 tablet (40 mg total) by mouth daily. If weight goes up 2-3lbs in a day or 3-4lbs in a week, increase to 40mg  in AM AND 20mg  in the PM, Disp: 60 tablet, Rfl: 6 .  haloperidol decanoate (HALDOL DECANOATE) 100 MG/ML injection, INJECT 1 ML EVERY 4 WEEKS, Disp: , Rfl:  .  isosorbide-hydrALAZINE (BIDIL) 20-37.5 MG tablet, TAKE 2 TABLETS BY MOUTH 3 (THREE) TIMES DAILY.(AM, NOON, BEDTIME), Disp: 180 tablet, Rfl: 6 .  losartan (COZAAR) 25 MG tablet, TAKE ONE TABLET BY MOUTH ONCE DAILY (AM), Disp: 30 tablet, Rfl: 10 .  potassium chloride SA (K-DUR,KLOR-CON) 20 MEQ  tablet, TAKE ONE TABLET BY MOUTH ONCE DAILY (AM), Disp: 30 tablet, Rfl: 6 .  spironolactone (ALDACTONE) 25 MG tablet, TAKE ONE TABLET BY MOUTH ONCE DAILY (BEDTIME), Disp: 30 tablet, Rfl: 5 .  albuterol (PROVENTIL HFA;VENTOLIN HFA) 108 (90 Base) MCG/ACT inhaler, Inhale 1-2 puffs into the lungs every 6 (six) hours as needed for wheezing or shortness of breath., Disp: , Rfl:  .  Tiotropium Bromide Monohydrate (SPIRIVA RESPIMAT) 2.5 MCG/ACT AERS, Inhale 1 spray into the lungs daily., Disp: 1 Inhaler, Rfl: 5  Current Facility-Administered Medications:  .  methylPREDNISolone acetate (DEPO-MEDROL) injection 40 mg, 40 mg, Intra-articular, Once, Nelson, [name], MD Allergies  Allergen Reactions  . Sulfa Antibiotics Nausea Only     Social History   Socioeconomic History  . Marital status: Single    Spouse name: Not on file  . Number of children: Not on file  . Years of education: Not on file  . Highest education level: Not on file  Occupational History  . Not on file  Social Needs  . Financial resource strain: Not on file  . Food insecurity:    Worry: Not on file    Inability: Not on file  . Transportation needs:    Medical: Not on file    Non-medical: Not on file  Tobacco Use  . Smoking status: Current Every Day Smoker    Packs/day: 1.50    Years: 44.00    Pack years: 66.00  Types: Cigarettes    Start date: 97  . Smokeless tobacco: Never Used  . Tobacco comment: 1ppd as of 12/06/17 ep  Substance and Sexual Activity  . Alcohol use: No  . Drug use: No  . Sexual activity: Not on file  Lifestyle  . Physical activity:    Days per week: Not on file    Minutes per session: Not on file  . Stress: Not on file  Relationships  . Social connections:    Talks on phone: Not on file    Gets together: Not on file    Attends religious service: Not on file    Active member of club or organization: Not on file    Attends meetings of clubs or organizations: Not on file    Relationship  status: Not on file  . Intimate partner violence:    Fear of current or ex partner: Not on file    Emotionally abused: Not on file    Physically abused: Not on file    Forced sexual activity: Not on file  Other Topics Concern  . Not on file  Social History Narrative  . Not on file    Physical Exam Pulmonary:     Effort: Pulmonary effort is normal. No respiratory distress.     Breath sounds: No wheezing or rales.  Abdominal:     General: There is no distension.  Musculoskeletal:        General: No swelling.     Right lower leg: No edema.     Left lower leg: No edema.  Skin:    General: Skin is warm and dry.         Future Appointments  Date Time Provider [place]  10/12/2018 10:40 AM Gerald Dresser, MD [facility] None     Wt 142 lb 3.2 oz (64.5 kg)   BMI 20.40 kg/m   Weight yesterday-didn't weigh Last visit weight-136  ATF pt CAO x4 standing in the kitchen.  Pt missed a dose of morning meds Saturday. He denies sob, chest pain and dizziness; although he does have a increase of sob while walking.  He took this morning medications.  He's 7lbs heavier this week than last week.  I spoke with Gerald Nelson @ [facility] and she agree that pt should follow the instructions for furosemide.   Which states that pt can take an extra dose for the AM and 1 tab for the evening.  Pt's pill box revised and I gave him an extra furosemide tab during this visit.  rx bottles verified and pill box refilled.    He agrees to re-start nicotine patch tomorrow 21 mg. He stated that he will "give it another try because Gerald Nelson said that his heart is weak".     Medication ordered: Bisoprolol   Gerald Nelson, EMT Paramedic [phone] 08/29/2018    ACTION: Home visit completed

## 2018-09-06 ENCOUNTER — Other Ambulatory Visit (HOSPITAL_COMMUNITY): Payer: Self-pay

## 2018-09-06 ENCOUNTER — Encounter (HOSPITAL_COMMUNITY): Payer: Self-pay

## 2018-09-06 NOTE — Progress Notes (Signed)
Paramedicine Encounter    Patient ID: Gerald Nelson, male    DOB: [birth year], 68 y.o.   MRN: [id number]    Patient Care Team: [name], MD as PCP - General (Internal Medicine) [name], LCSW as Social Worker (Licensed Clinical Social Worker)  Patient Active Problem List   Diagnosis Date Noted  . Noncompliance 04/23/2017  . Mitral regurgitation 04/23/2017  . Thrombus - possible apical thrombus 01/2017 04/23/2017  . Hyperlipidemia 04/23/2017  . Pulmonary hypertension ([place])   . SOB (shortness of breath)   . Palliative care by specialist   . Tobacco abuse 04/19/2017  . Acute respiratory failure with hypoxia ([place]) 01/27/2017  . Hypertensive urgency 01/27/2017  . CHF (congestive heart failure) ([place]) 01/26/2017  . CKD (chronic kidney disease), stage III ([place]) 01/04/2017  . Essential hypertension 01/04/2017  . CVA (cerebral infarction) 12/12/2013  . Hyponatremia 12/06/2012  . Hiatal hernia 12/06/2012  . Syncope 12/06/2012  . Schizophrenia ([place])     Current Outpatient Medications:  .  bisoprolol (ZEBETA) 10 MG tablet, Take 1 tablet (10 mg total) by mouth daily., Disp: 30 tablet, Rfl: 3 .  losartan (COZAAR) 25 MG tablet, TAKE ONE TABLET BY MOUTH ONCE DAILY (AM), Disp: 30 tablet, Rfl: 10 .  potassium chloride SA (K-DUR,KLOR-CON) 20 MEQ tablet, TAKE ONE TABLET BY MOUTH ONCE DAILY (AM), Disp: 30 tablet, Rfl: 6 .  spironolactone (ALDACTONE) 25 MG tablet, TAKE ONE TABLET BY MOUTH ONCE DAILY (BEDTIME), Disp: 30 tablet, Rfl: 5 .  albuterol (PROVENTIL HFA;VENTOLIN HFA) 108 (90 Base) MCG/ACT inhaler, Inhale 1-2 puffs into the lungs every 6 (six) hours as needed for wheezing or shortness of breath., Disp: , Rfl:  .  aspirin 81 MG EC tablet, TAKE 1 TABLET BY MOUTH EVERY DAY, Disp: 30 tablet, Rfl: 1 .  atorvastatin (LIPITOR) 40 MG tablet, TAKE ONE TABLET BY MOUTH EVERY DAY IN THE EVENING, Disp: 30 tablet, Rfl: 6 .  furosemide (LASIX) 40 MG tablet, Take 1 tablet (40 mg total) by mouth  daily. If weight goes up 2-3lbs in a day or 3-4lbs in a week, increase to 40mg  in AM AND 20mg  in the PM, Disp: 60 tablet, Rfl: 6 .  haloperidol decanoate (HALDOL DECANOATE) 100 MG/ML injection, INJECT 1 ML EVERY 4 WEEKS, Disp: , Rfl:  .  isosorbide-hydrALAZINE (BIDIL) 20-37.5 MG tablet, TAKE 2 TABLETS BY MOUTH 3 (THREE) TIMES DAILY.(AM, NOON, BEDTIME), Disp: 180 tablet, Rfl: 6 .  Tiotropium Bromide Monohydrate (SPIRIVA RESPIMAT) 2.5 MCG/ACT AERS, Inhale 1 spray into the lungs daily., Disp: 1 Inhaler, Rfl: 5  Current Facility-Administered Medications:  .  methylPREDNISolone acetate (DEPO-MEDROL) injection 40 mg, 40 mg, Intra-articular, Once, [name], [name], MD Allergies  Allergen Reactions  . Sulfa Antibiotics Nausea Only     Social History   Socioeconomic History  . Marital status: Single    Spouse name: Not on file  . Number of children: Not on file  . Years of education: Not on file  . Highest education level: Not on file  Occupational History  . Not on file  Social Needs  . Financial resource strain: Not on file  . Food insecurity:    Worry: Not on file    Inability: Not on file  . Transportation needs:    Medical: Not on file    Non-medical: Not on file  Tobacco Use  . Smoking status: Current Every Day Smoker    Packs/day: 1.50    Years: 44.00    Pack years: 66.00  Types: Cigarettes    Start date: 68  . Smokeless tobacco: Never Used  . Tobacco comment: 1ppd as of 12/06/17 ep  Substance and Sexual Activity  . Alcohol use: No  . Drug use: No  . Sexual activity: Not on file  Lifestyle  . Physical activity:    Days per week: Not on file    Minutes per session: Not on file  . Stress: Not on file  Relationships  . Social connections:    Talks on phone: Not on file    Gets together: Not on file    Attends religious service: Not on file    Active member of club or organization: Not on file    Attends meetings of clubs or organizations: Not on file    Relationship  status: Not on file  . Intimate partner violence:    Fear of current or ex partner: Not on file    Emotionally abused: Not on file    Physically abused: Not on file    Forced sexual activity: Not on file  Other Topics Concern  . Not on file  Social History Narrative  . Not on file    Physical Exam Pulmonary:     Effort: No respiratory distress.     Breath sounds: No wheezing or rales.  Abdominal:     General: There is no distension.  Musculoskeletal:        General: No swelling.     Right lower leg: No edema.     Left lower leg: No edema.  Skin:    General: Skin is warm and dry.         Future Appointments  Date Time Provider [place]  10/12/2018 10:40 AM [name], MD [facility] None  12/01/2018 10:30 AM Brand Males, MD [facility] None     BP 126/90 (BP Location: Left Arm, Patient Position: Standing, Cuff Size: Normal)   Pulse 76   Resp 20   Wt 137 lb 3.2 oz (62.2 kg)   SpO2 96%   BMI 19.69 kg/m   Weight yesterday-didn't weigh Last visit weight-142  ATF pt CAO x4 standing in the kitchen with no complaints. He stated that he began nicotine patches last week and he's smoking a lot less than he was before.  Pt denies sob, chest pain and dizziness.  He has taken his medications for today.  Pt hasn't added more pillows to sleep at night. He stated that he will pick up haldol from the pharmacy tomorrow before he goes to the psychiatrist.  rx bottles verified and pill box refilled.  Today's CHP visit was cut short due to one of his room mates becoming irate in the house. His room mate has a hx of mental health illness and just came home from [facility] in [place] a couple of weeks ago.  Pt stated that he feels safe at home and denies having issues with his room mate.     Medication ordered: Bisoprolol filled [place], EMT Paramedic [phone] 09/06/2018    ACTION: Home visit completed

## 2018-09-12 ENCOUNTER — Encounter (HOSPITAL_COMMUNITY): Payer: Self-pay

## 2018-09-12 ENCOUNTER — Other Ambulatory Visit (HOSPITAL_COMMUNITY): Payer: Self-pay

## 2018-09-12 ENCOUNTER — Other Ambulatory Visit: Payer: Self-pay | Admitting: Student

## 2018-09-12 ENCOUNTER — Telehealth (HOSPITAL_COMMUNITY): Payer: Self-pay

## 2018-09-12 NOTE — Progress Notes (Signed)
Paramedicine Encounter    Patient ID: Gerald Nelson, male    DOB: [birth year], 68 y.o.   MRN: [id number]    Patient Care Team: [name], MD as PCP - General (Internal Medicine) [name], LCSW as Social Worker (Licensed Clinical Social Worker)  Patient Active Problem List   Diagnosis Date Noted  . Noncompliance 04/23/2017  . Mitral regurgitation 04/23/2017  . Thrombus - possible apical thrombus 01/2017 04/23/2017  . Hyperlipidemia 04/23/2017  . Pulmonary hypertension ([place])   . SOB (shortness of breath)   . Palliative care by specialist   . Tobacco abuse 04/19/2017  . Acute respiratory failure with hypoxia ([place]) 01/27/2017  . Hypertensive urgency 01/27/2017  . CHF (congestive heart failure) ([place]) 01/26/2017  . CKD (chronic kidney disease), stage III ([place]) 01/04/2017  . Essential hypertension 01/04/2017  . CVA (cerebral infarction) 12/12/2013  . Hyponatremia 12/06/2012  . Hiatal hernia 12/06/2012  . Syncope 12/06/2012  . Schizophrenia ([place])     Current Outpatient Medications:  .  aspirin 81 MG EC tablet, TAKE 1 TABLET BY MOUTH EVERY DAY, Disp: 30 tablet, Rfl: 1 .  atorvastatin (LIPITOR) 40 MG tablet, TAKE ONE TABLET BY MOUTH EVERY DAY IN THE EVENING, Disp: 30 tablet, Rfl: 6 .  bisoprolol (ZEBETA) 10 MG tablet, Take 1 tablet (10 mg total) by mouth daily., Disp: 30 tablet, Rfl: 3 .  furosemide (LASIX) 40 MG tablet, Take 1 tablet (40 mg total) by mouth daily. If weight goes up 2-3lbs in a day or 3-4lbs in a week, increase to 40mg  in AM AND 20mg  in the PM, Disp: 60 tablet, Rfl: 6 .  haloperidol decanoate (HALDOL DECANOATE) 100 MG/ML injection, INJECT 1 ML EVERY 4 WEEKS, Disp: , Rfl:  .  isosorbide-hydrALAZINE (BIDIL) 20-37.5 MG tablet, TAKE 2 TABLETS BY MOUTH 3 (THREE) TIMES DAILY.(AM, NOON, BEDTIME), Disp: 180 tablet, Rfl: 6 .  losartan (COZAAR) 25 MG tablet, TAKE ONE TABLET BY MOUTH ONCE DAILY (AM), Disp: 30 tablet, Rfl: 10 .  potassium chloride SA (K-DUR,KLOR-CON) 20 MEQ  tablet, TAKE ONE TABLET BY MOUTH ONCE DAILY (AM), Disp: 30 tablet, Rfl: 6 .  spironolactone (ALDACTONE) 25 MG tablet, TAKE ONE TABLET BY MOUTH ONCE DAILY (BEDTIME), Disp: 30 tablet, Rfl: 5 .  albuterol (PROVENTIL HFA;VENTOLIN HFA) 108 (90 Base) MCG/ACT inhaler, Inhale 1-2 puffs into the lungs every 6 (six) hours as needed for wheezing or shortness of breath., Disp: , Rfl:  .  Tiotropium Bromide Monohydrate (SPIRIVA RESPIMAT) 2.5 MCG/ACT AERS, Inhale 1 spray into the lungs daily., Disp: 1 Inhaler, Rfl: 5  Current Facility-Administered Medications:  .  methylPREDNISolone acetate (DEPO-MEDROL) injection 40 mg, 40 mg, Intra-articular, Once, [name], [name], MD Allergies  Allergen Reactions  . Sulfa Antibiotics Nausea Only     Social History   Socioeconomic History  . Marital status: Single    Spouse name: Not on file  . Number of children: Not on file  . Years of education: Not on file  . Highest education level: Not on file  Occupational History  . Not on file  Social Needs  . Financial resource strain: Not on file  . Food insecurity:    Worry: Not on file    Inability: Not on file  . Transportation needs:    Medical: Not on file    Non-medical: Not on file  Tobacco Use  . Smoking status: Current Every Day Smoker    Packs/day: 1.50    Years: 44.00    Pack years: 66.00  Types: Cigarettes    Start date: 81  . Smokeless tobacco: Never Used  . Tobacco comment: 1ppd as of 12/06/17 ep  Substance and Sexual Activity  . Alcohol use: No  . Drug use: No  . Sexual activity: Not on file  Lifestyle  . Physical activity:    Days per week: Not on file    Minutes per session: Not on file  . Stress: Not on file  Relationships  . Social connections:    Talks on phone: Not on file    Gets together: Not on file    Attends religious service: Not on file    Active member of club or organization: Not on file    Attends meetings of clubs or organizations: Not on file    Relationship  status: Not on file  . Intimate partner violence:    Fear of current or ex partner: Not on file    Emotionally abused: Not on file    Physically abused: Not on file    Forced sexual activity: Not on file  Other Topics Concern  . Not on file  Social History Narrative  . Not on file    Physical Exam Pulmonary:     Effort: Pulmonary effort is normal. No respiratory distress.     Breath sounds: Wheezing present.     Comments: Wheezing noted to both upper lobes        Future Appointments  Date Time Provider [place]  10/12/2018 10:40 AM [name], MD [facility] None  12/01/2018 10:30 AM Brand Males, MD [facility] None     BP 102/70 (BP Location: Right Arm, Patient Position: Standing, Cuff Size: Normal)   Pulse (!) 113   Resp 20   Wt 138 lb 6.4 oz (62.8 kg)   SpO2 98%   BMI 19.86 kg/m   Weight yesterday-didn't weigh Last visit weight-137  ATF pt CAO x4 standing in the kitchen smoking a cigarette w/no complaints.  He denies sob, chest pain and dizziness.  He's taken all of his meds this week and this morning.  He hasn't started the nicotine patches yet.  He stated that he will start tomorrow.  rx bottles verified and pill box refilled.    Medication ordered: None  Lillianah Swartzentruber, EMT Paramedic [phone] 09/12/2018    ACTION: Home visit completed

## 2018-09-12 NOTE — Telephone Encounter (Signed)
Pt called to confirm today's CHP visit.  

## 2018-09-19 ENCOUNTER — Other Ambulatory Visit (HOSPITAL_COMMUNITY): Payer: Self-pay

## 2018-09-19 ENCOUNTER — Encounter (HOSPITAL_COMMUNITY): Payer: Self-pay

## 2018-09-19 ENCOUNTER — Telehealth (HOSPITAL_COMMUNITY): Payer: Self-pay

## 2018-09-19 NOTE — Telephone Encounter (Signed)
Pt called to request that I come later this morning. We agreed on 11:30 am today.

## 2018-09-19 NOTE — Progress Notes (Signed)
Paramedicine Encounter    Patient ID: Gerald Nelson, male    DOB: 12/26/1950, 68 y.o.   MRN: [id number]    Patient Care Team: [name], MD as PCP - General (Internal Medicine) [name], LCSW as Social Worker (Licensed Clinical Social Worker)  Patient Active Problem List   Diagnosis Date Noted  . Noncompliance 04/23/2017  . Mitral regurgitation 04/23/2017  . Thrombus - possible apical thrombus 01/2017 04/23/2017  . Hyperlipidemia 04/23/2017  . Pulmonary hypertension ([place])   . SOB (shortness of breath)   . Palliative care by specialist   . Tobacco abuse 04/19/2017  . Acute respiratory failure with hypoxia ([place]) 01/27/2017  . Hypertensive urgency 01/27/2017  . CHF (congestive heart failure) ([place]) 01/26/2017  . CKD (chronic kidney disease), stage III ([place]) 01/04/2017  . Essential hypertension 01/04/2017  . CVA (cerebral infarction) 12/12/2013  . Hyponatremia 12/06/2012  . Hiatal hernia 12/06/2012  . Syncope 12/06/2012  . Schizophrenia ([place])     Current Outpatient Medications:  .  atorvastatin (LIPITOR) 40 MG tablet, TAKE ONE TABLET BY MOUTH EVERY DAY IN THE EVENING, Disp: 30 tablet, Rfl: 6 .  bisoprolol (ZEBETA) 10 MG tablet, Take 1 tablet (10 mg total) by mouth daily., Disp: 30 tablet, Rfl: 3 .  furosemide (LASIX) 40 MG tablet, Take 1 tablet (40 mg total) by mouth daily. If weight goes up 2-3lbs in a day or 3-4lbs in a week, increase to 40mg  in AM AND 20mg  in the PM, Disp: 60 tablet, Rfl: 6 .  losartan (COZAAR) 25 MG tablet, TAKE ONE TABLET BY MOUTH ONCE DAILY (AM), Disp: 30 tablet, Rfl: 10 .  potassium chloride SA (K-DUR,KLOR-CON) 20 MEQ tablet, TAKE ONE TABLET BY MOUTH ONCE DAILY (AM), Disp: 30 tablet, Rfl: 6 .  spironolactone (ALDACTONE) 25 MG tablet, TAKE ONE TABLET BY MOUTH ONCE DAILY (BEDTIME), Disp: 30 tablet, Rfl: 5 .  albuterol (PROVENTIL HFA;VENTOLIN HFA) 108 (90 Base) MCG/ACT inhaler, Inhale 1-2 puffs into the lungs every 6 (six) hours as needed for wheezing or  shortness of breath., Disp: , Rfl:  .  aspirin 81 MG EC tablet, TAKE 1 TABLET BY MOUTH EVERY DAY, Disp: 30 tablet, Rfl: 1 .  haloperidol decanoate (HALDOL DECANOATE) 100 MG/ML injection, INJECT 1 ML EVERY 4 WEEKS, Disp: , Rfl:  .  isosorbide-hydrALAZINE (BIDIL) 20-37.5 MG tablet, TAKE 2 TABLETS BY MOUTH 3 (THREE) TIMES DAILY.(AM, NOON, BEDTIME), Disp: 180 tablet, Rfl: 6 .  Tiotropium Bromide Monohydrate (SPIRIVA RESPIMAT) 2.5 MCG/ACT AERS, Inhale 1 spray into the lungs daily., Disp: 1 Inhaler, Rfl: 5  Current Facility-Administered Medications:  .  methylPREDNISolone acetate (DEPO-MEDROL) injection 40 mg, 40 mg, Intra-articular, Once, [name], [name], MD Allergies  Allergen Reactions  . Sulfa Antibiotics Nausea Only     Social History   Socioeconomic History  . Marital status: Single    Spouse name: Not on file  . Number of children: Not on file  . Years of education: Not on file  . Highest education level: Not on file  Occupational History  . Not on file  Social Needs  . Financial resource strain: Not on file  . Food insecurity:    Worry: Not on file    Inability: Not on file  . Transportation needs:    Medical: Not on file    Non-medical: Not on file  Tobacco Use  . Smoking status: Current Every Day Smoker    Packs/day: 1.50    Years: 44.00    Pack years: 66.00  Types: Cigarettes    Start date: 59  . Smokeless tobacco: Never Used  . Tobacco comment: 1ppd as of 12/06/17 ep  Substance and Sexual Activity  . Alcohol use: No  . Drug use: No  . Sexual activity: Not on file  Lifestyle  . Physical activity:    Days per week: Not on file    Minutes per session: Not on file  . Stress: Not on file  Relationships  . Social connections:    Talks on phone: Not on file    Gets together: Not on file    Attends religious service: Not on file    Active member of club or organization: Not on file    Attends meetings of clubs or organizations: Not on file    Relationship  status: Not on file  . Intimate partner violence:    Fear of current or ex partner: Not on file    Emotionally abused: Not on file    Physically abused: Not on file    Forced sexual activity: Not on file  Other Topics Concern  . Not on file  Social History Narrative  . Not on file    Physical Exam Pulmonary:     Breath sounds: Wheezing present.     Comments: Expiratory wheezing Abdominal:     General: There is no distension.  Musculoskeletal:        General: No swelling.     Right lower leg: No edema.  Skin:    General: Skin is warm and dry.         Future Appointments  Date Time Provider [place]  10/09/2018 10:30 AM [facility]  10/12/2018 10:40 AM [name], MD [facility] None  12/01/2018 10:30 AM Brand Males, MD [facility] None     BP 102/82 (BP Location: Left Arm, Patient Position: Standing, Cuff Size: Normal)   Pulse 80   Resp 20   Wt 135 lb (61.2 kg)   SpO2 95%   BMI 19.37 kg/m   Weight yesterday-didn't weigh Last visit weight-138  ATF pt CAO x4 standing in the kitchen smoking a cigarette.  He denies sob, chest pain and dizziness.  He took his morning medications beside albuterol inhaler.  Pt has expiratory wheezing in all four lobes.  Pt has no other complaints; no sob, chest pain and dizziness. Pt is still smoking, he still hasn't started the nicotine patches.  He denies needing to add pillows or blankets (pt sleeps on rolled up blankets as pillows) to sleep at night. rx bottles verified and pill box refilled.     Pt doesn't use a nebulizer and has never used one, we discussed him using the inhaler daily.  Pt reports "still not having a good appetite", we also discussed other reasons for his loose of appetite.    Medication ordered: bidil  Rossetta Kama, EMT Paramedic [phone] 09/19/2018    ACTION: Home visit completed

## 2018-09-25 ENCOUNTER — Telehealth (HOSPITAL_COMMUNITY): Payer: Self-pay | Admitting: Licensed Clinical Social Worker

## 2018-09-25 NOTE — Telephone Encounter (Signed)
CSW reached out to pt to check in regarding food and medication status at this time. Pt reports he is set on medications at this time as his paramedic was out last week- also reports being secure with his food though is looking forward to getting started with [employer].  CSW encouraged pt to reach out with any concerns and will continue to follow and assist as needed  Gerald Nelson, [place] Worker [facility] [phone]

## 2018-09-26 ENCOUNTER — Other Ambulatory Visit (HOSPITAL_COMMUNITY): Payer: Self-pay

## 2018-09-26 ENCOUNTER — Telehealth (HOSPITAL_COMMUNITY): Payer: Self-pay

## 2018-09-26 NOTE — Progress Notes (Signed)
Paramedicine Encounter    Patient ID: Gerald Nelson, male    DOB: 03-09-1951, 68 y.o.   MRN: [id number]    Patient Care Team: [name], MD as PCP - General (Internal Medicine) [name], LCSW as Social Worker (Licensed Clinical Social Worker)  Patient Active Problem List   Diagnosis Date Noted  . Noncompliance 04/23/2017  . Mitral regurgitation 04/23/2017  . Thrombus - possible apical thrombus 01/2017 04/23/2017  . Hyperlipidemia 04/23/2017  . Pulmonary hypertension ([place])   . SOB (shortness of breath)   . Palliative care by specialist   . Tobacco abuse 04/19/2017  . Acute respiratory failure with hypoxia ([place]) 01/27/2017  . Hypertensive urgency 01/27/2017  . CHF (congestive heart failure) ([place]) 01/26/2017  . CKD (chronic kidney disease), stage III ([place]) 01/04/2017  . Essential hypertension 01/04/2017  . CVA (cerebral infarction) 12/12/2013  . Hyponatremia 12/06/2012  . Hiatal hernia 12/06/2012  . Syncope 12/06/2012  . Schizophrenia ([place])     Current Outpatient Medications:  .  aspirin 81 MG EC tablet, TAKE 1 TABLET BY MOUTH EVERY DAY, Disp: 30 tablet, Rfl: 1 .  atorvastatin (LIPITOR) 40 MG tablet, TAKE ONE TABLET BY MOUTH EVERY DAY IN THE EVENING, Disp: 30 tablet, Rfl: 6 .  bisoprolol (ZEBETA) 10 MG tablet, Take 1 tablet (10 mg total) by mouth daily., Disp: 30 tablet, Rfl: 3 .  furosemide (LASIX) 40 MG tablet, Take 1 tablet (40 mg total) by mouth daily. If weight goes up 2-3lbs in a day or 3-4lbs in a week, increase to 40mg  in AM AND 20mg  in the PM, Disp: 60 tablet, Rfl: 6 .  isosorbide-hydrALAZINE (BIDIL) 20-37.5 MG tablet, TAKE 2 TABLETS BY MOUTH 3 (THREE) TIMES DAILY.(AM, NOON, BEDTIME), Disp: 180 tablet, Rfl: 6 .  losartan (COZAAR) 25 MG tablet, TAKE ONE TABLET BY MOUTH ONCE DAILY (AM), Disp: 30 tablet, Rfl: 10 .  potassium chloride SA (K-DUR,KLOR-CON) 20 MEQ tablet, TAKE ONE TABLET BY MOUTH ONCE DAILY (AM), Disp: 30 tablet, Rfl: 6 .  spironolactone (ALDACTONE) 25  MG tablet, TAKE ONE TABLET BY MOUTH ONCE DAILY (BEDTIME), Disp: 30 tablet, Rfl: 5 .  albuterol (PROVENTIL HFA;VENTOLIN HFA) 108 (90 Base) MCG/ACT inhaler, Inhale 1-2 puffs into the lungs every 6 (six) hours as needed for wheezing or shortness of breath., Disp: , Rfl:  .  haloperidol decanoate (HALDOL DECANOATE) 100 MG/ML injection, INJECT 1 ML EVERY 4 WEEKS, Disp: , Rfl:  .  Tiotropium Bromide Monohydrate (SPIRIVA RESPIMAT) 2.5 MCG/ACT AERS, Inhale 1 spray into the lungs daily., Disp: 1 Inhaler, Rfl: 5  Current Facility-Administered Medications:  .  methylPREDNISolone acetate (DEPO-MEDROL) injection 40 mg, 40 mg, Intra-articular, Once, [name], [name], MD Allergies  Allergen Reactions  . Sulfa Antibiotics Nausea Only     Social History   Socioeconomic History  . Marital status: Single    Spouse name: Not on file  . Number of children: Not on file  . Years of education: Not on file  . Highest education level: Not on file  Occupational History  . Not on file  Social Needs  . Financial resource strain: Not on file  . Food insecurity:    Worry: Not on file    Inability: Not on file  . Transportation needs:    Medical: Not on file    Non-medical: Not on file  Tobacco Use  . Smoking status: Current Every Day Smoker    Packs/day: 1.50    Years: 44.00    Pack years: 66.00  Types: Cigarettes    Start date: 40  . Smokeless tobacco: Never Used  . Tobacco comment: 1ppd as of 12/06/17 ep  Substance and Sexual Activity  . Alcohol use: No  . Drug use: No  . Sexual activity: Not on file  Lifestyle  . Physical activity:    Days per week: Not on file    Minutes per session: Not on file  . Stress: Not on file  Relationships  . Social connections:    Talks on phone: Not on file    Gets together: Not on file    Attends religious service: Not on file    Active member of club or organization: Not on file    Attends meetings of clubs or organizations: Not on file    Relationship  status: Not on file  . Intimate partner violence:    Fear of current or ex partner: Not on file    Emotionally abused: Not on file    Physically abused: Not on file    Forced sexual activity: Not on file  Other Topics Concern  . Not on file  Social History Narrative  . Not on file    Physical Exam Pulmonary:     Effort: Respiratory distress present.     Breath sounds: Wheezing present. No rales.  Musculoskeletal:        General: No swelling.     Right lower leg: No edema.     Left lower leg: No edema.  Skin:    General: Skin is warm and dry.         Future Appointments  Date Time Provider [place]  10/09/2018 10:30 AM [facility]  10/12/2018 10:40 AM [name], MD [facility] None  12/01/2018 10:30 AM Brand Males, MD [facility] None     BP (!) 150/90 (BP Location: Left Arm, Patient Position: Standing, Cuff Size: Normal)   Pulse 87   Resp 20   Wt 133 lb 3.2 oz (60.4 kg)   SpO2 96%   BMI 19.11 kg/m   Weight yesterday-133 Last visit weight-135  ATF pt CAO x4 standing in the kitchen coughing after smoking a cigarette.  [place]  Pt has just taken afternoon meds prior to this visit.  Pt has taken all of his meds for this week.  He picked up his meds last week from the [facility].  Pt denies sob, however he leans over to catch his breathe.  Pt is wheezing in all four lung fields.  He hasn't used neither one of his inhalers today. We discussed when and how often he is to use the inhalers throughout the day.  Pt denies cough, fever and sore throat.  rx bottles verified and pill box refilled.    Medication ordered: none  Tasha Diaz, EMT Paramedic [phone] 09/26/2018    ACTION: Home visit completed

## 2018-09-27 ENCOUNTER — Telehealth (HOSPITAL_COMMUNITY): Payer: Self-pay | Admitting: Licensed Clinical Social Worker

## 2018-09-27 NOTE — Telephone Encounter (Signed)
Patient identified as a candidate to receive 14 heart healthy meals per week for 3 months through [facility] partnership with [employer] program.   Completed referral sent in for review.  Anticipate patient will receive first shipment of food in 1-3 business days.  Gerald Stockley H. [name], LCSW Clinical Social Worker [facility] Desk#: [phone] Cell#: [phone]   

## 2018-09-27 NOTE — Telephone Encounter (Signed)
Pt called to confirm today's CHP visit. He stated that he's home now and request that I come now.

## 2018-09-28 ENCOUNTER — Telehealth (HOSPITAL_COMMUNITY): Payer: Self-pay

## 2018-09-28 NOTE — Telephone Encounter (Signed)
[place] [phone] call [facility] [phone] ([facility])  [facility]  Check on symbort or delara....trelogy combines both in one along with taking once a day

## 2018-09-30 NOTE — Telephone Encounter (Signed)
Pt called to request that I come later today, he said that he will call me back later today

## 2018-10-03 ENCOUNTER — Encounter (HOSPITAL_COMMUNITY): Payer: Self-pay

## 2018-10-03 ENCOUNTER — Other Ambulatory Visit (HOSPITAL_COMMUNITY): Payer: Self-pay

## 2018-10-03 ENCOUNTER — Telehealth: Payer: Self-pay | Admitting: Cardiovascular Disease

## 2018-10-03 NOTE — Progress Notes (Signed)
Paramedicine Encounter    Patient ID: Gerald Nelson, male    DOB: 1950/11/25, 68 y.o.   MRN: [id number]    Patient Care Team: [name], MD as PCP - General (Internal Medicine) [name], MD as PCP - Advanced Heart Failure (Cardiology) [name], LCSW as Social Worker (Licensed Clinical Social Worker)  Patient Active Problem List   Diagnosis Date Noted  . Noncompliance 04/23/2017  . Mitral regurgitation 04/23/2017  . Thrombus - possible apical thrombus 01/2017 04/23/2017  . Hyperlipidemia 04/23/2017  . Pulmonary hypertension ([place])   . SOB (shortness of breath)   . Palliative care by specialist   . Tobacco abuse 04/19/2017  . Acute respiratory failure with hypoxia ([place]) 01/27/2017  . Hypertensive urgency 01/27/2017  . CHF (congestive heart failure) ([place]) 01/26/2017  . CKD (chronic kidney disease), stage III ([place]) 01/04/2017  . Essential hypertension 01/04/2017  . CVA (cerebral infarction) 12/12/2013  . Hyponatremia 12/06/2012  . Hiatal hernia 12/06/2012  . Syncope 12/06/2012  . Schizophrenia ([place])     Current Outpatient Medications:  .  aspirin 81 MG EC tablet, TAKE 1 TABLET BY MOUTH EVERY DAY, Disp: 30 tablet, Rfl: 1 .  atorvastatin (LIPITOR) 40 MG tablet, TAKE ONE TABLET BY MOUTH EVERY DAY IN THE EVENING, Disp: 30 tablet, Rfl: 6 .  bisoprolol (ZEBETA) 10 MG tablet, Take 1 tablet (10 mg total) by mouth daily., Disp: 30 tablet, Rfl: 3 .  furosemide (LASIX) 40 MG tablet, Take 1 tablet (40 mg total) by mouth daily. If weight goes up 2-3lbs in a day or 3-4lbs in a week, increase to 40mg  in AM AND 20mg  in the PM, Disp: 60 tablet, Rfl: 6 .  haloperidol decanoate (HALDOL DECANOATE) 100 MG/ML injection, INJECT 1 ML EVERY 4 WEEKS, Disp: , Rfl:  .  isosorbide-hydrALAZINE (BIDIL) 20-37.5 MG tablet, TAKE 2 TABLETS BY MOUTH 3 (THREE) TIMES DAILY.(AM, NOON, BEDTIME), Disp: 180 tablet, Rfl: 6 .  losartan (COZAAR) 25 MG tablet, TAKE ONE TABLET BY MOUTH ONCE DAILY (AM), Disp: 30  tablet, Rfl: 10 .  potassium chloride SA (K-DUR,KLOR-CON) 20 MEQ tablet, TAKE ONE TABLET BY MOUTH ONCE DAILY (AM), Disp: 30 tablet, Rfl: 6 .  spironolactone (ALDACTONE) 25 MG tablet, TAKE ONE TABLET BY MOUTH ONCE DAILY (BEDTIME), Disp: 30 tablet, Rfl: 5 .  albuterol (PROVENTIL HFA;VENTOLIN HFA) 108 (90 Base) MCG/ACT inhaler, Inhale 1-2 puffs into the lungs every 6 (six) hours as needed for wheezing or shortness of breath., Disp: , Rfl:  .  Tiotropium Bromide Monohydrate (SPIRIVA RESPIMAT) 2.5 MCG/ACT AERS, Inhale 1 spray into the lungs daily., Disp: 1 Inhaler, Rfl: 5  Current Facility-Administered Medications:  .  methylPREDNISolone acetate (DEPO-MEDROL) injection 40 mg, 40 mg, Intra-articular, Once, [name], [name], MD Allergies  Allergen Reactions  . Sulfa Antibiotics Nausea Only     Social History   Socioeconomic History  . Marital status: Single    Spouse name: Not on file  . Number of children: Not on file  . Years of education: Not on file  . Highest education level: Not on file  Occupational History  . Not on file  Social Needs  . Financial resource strain: Not on file  . Food insecurity:    Worry: Not on file    Inability: Not on file  . Transportation needs:    Medical: Not on file    Non-medical: Not on file  Tobacco Use  . Smoking status: Current Every Day Smoker    Packs/day: 1.50  Years: 44.00    Pack years: 66.00    Types: Cigarettes    Start date: 58  . Smokeless tobacco: Never Used  . Tobacco comment: 1ppd as of 12/06/17 ep  Substance and Sexual Activity  . Alcohol use: No  . Drug use: No  . Sexual activity: Not on file  Lifestyle  . Physical activity:    Days per week: Not on file    Minutes per session: Not on file  . Stress: Not on file  Relationships  . Social connections:    Talks on phone: Not on file    Gets together: Not on file    Attends religious service: Not on file    Active member of club or organization: Not on file    Attends  meetings of clubs or organizations: Not on file    Relationship status: Not on file  . Intimate partner violence:    Fear of current or ex partner: Not on file    Emotionally abused: Not on file    Physically abused: Not on file    Forced sexual activity: Not on file  Other Topics Concern  . Not on file  Social History Narrative  . Not on file    Physical Exam Pulmonary:     Effort: Pulmonary effort is normal. No respiratory distress.     Breath sounds: Wheezing present. No rales.  Musculoskeletal:     Right lower leg: No edema.     Left lower leg: No edema.  Skin:    General: Skin is warm.         Future Appointments  Date Time Provider [place]  10/09/2018 10:30 AM [facility] ECHO 4 [facility]  10/12/2018 10:40 AM [name], MD [facility] None  12/01/2018 10:30 AM Brand Males, MD [facility] None     BP 116/84 (BP Location: Left Arm, Patient Position: Standing, Cuff Size: Normal)   Pulse 91   Temp 97.8 F (36.6 C)   Resp 20   Wt 135 lb 3.2 oz (61.3 kg)   SpO2 99%   BMI 19.40 kg/m   Weight yesterday-133 Last visit weight-133  ATF pt CAO x4 standing in the living room, smoking a cigarette.  He stated that he feels good and denies sob, chest pain and dizziness.  He also denies fever, sore throat and body aches.  Pt stated that he still has a cough, which he's had for several weeks.  He said that it "isn't as bad as it used to be". Pt had a cough about 6 weeks ago that he took mucinex for, has now eased up.  Expiratory and inspiratory wheezing noted to all lobes, during my assessment  He stated that he used his inhaler today. He was encouraged to used the rescue inhaler.  He stated that he has a good appetite now and is able eat.   rx bottles verified and pill box refilled.   Pt filled out the "mom meals" application last week. I explained to him that the food will be delivered in a big box and how it unload it. face mask, safety glasses  and gloves worn throughout the entire chp visit.     Medication ordered: bisoprlol Losartan  Gerald Nelson, EMT Paramedic [phone] 10/03/2018    ACTION: Home visit completed

## 2018-10-03 NOTE — Telephone Encounter (Signed)
Spoke with patient on phone to cancel echo 4/6 due to [facility] Virus concerns ok to reschedule in 3 months

## 2018-10-09 ENCOUNTER — Other Ambulatory Visit (HOSPITAL_COMMUNITY): Payer: Self-pay

## 2018-10-10 ENCOUNTER — Other Ambulatory Visit (HOSPITAL_COMMUNITY): Payer: Self-pay | Admitting: Cardiology

## 2018-10-10 ENCOUNTER — Other Ambulatory Visit (HOSPITAL_COMMUNITY): Payer: Self-pay

## 2018-10-10 NOTE — Progress Notes (Signed)
Paramedicine Encounter    Patient ID: Gerald Nelson, male    DOB: Oct 30, 1950, 68 y.o.   MRN: [id number]   Patient Care Team: [name], MD as PCP - General (Internal Medicine) [name], MD as PCP - Advanced Heart Failure (Cardiology) [name], LCSW as Social Worker (Licensed Clinical Social Worker)  Patient Active Problem List   Diagnosis Date Noted  . Noncompliance 04/23/2017  . Mitral regurgitation 04/23/2017  . Thrombus - possible apical thrombus 01/2017 04/23/2017  . Hyperlipidemia 04/23/2017  . Pulmonary hypertension ([place])   . SOB (shortness of breath)   . Palliative care by specialist   . Tobacco abuse 04/19/2017  . Acute respiratory failure with hypoxia ([place]) 01/27/2017  . Hypertensive urgency 01/27/2017  . CHF (congestive heart failure) ([place]) 01/26/2017  . CKD (chronic kidney disease), stage III ([place]) 01/04/2017  . Essential hypertension 01/04/2017  . CVA (cerebral infarction) 12/12/2013  . Hyponatremia 12/06/2012  . Hiatal hernia 12/06/2012  . Syncope 12/06/2012  . Schizophrenia ([place])     Current Outpatient Medications:  .  albuterol (PROVENTIL HFA;VENTOLIN HFA) 108 (90 Base) MCG/ACT inhaler, Inhale 1-2 puffs into the lungs every 6 (six) hours as needed for wheezing or shortness of breath., Disp: , Rfl:  .  aspirin 81 MG EC tablet, TAKE 1 TABLET BY MOUTH EVERY DAY, Disp: 30 tablet, Rfl: 1 .  atorvastatin (LIPITOR) 40 MG tablet, TAKE ONE TABLET BY MOUTH EVERY DAY IN THE EVENING, Disp: 30 tablet, Rfl: 6 .  bisoprolol (ZEBETA) 10 MG tablet, Take 1 tablet (10 mg total) by mouth daily., Disp: 30 tablet, Rfl: 3 .  furosemide (LASIX) 40 MG tablet, Take 1 tablet (40 mg total) by mouth daily. If weight goes up 2-3lbs in a day or 3-4lbs in a week, increase to 40mg  in AM AND 20mg  in the PM, Disp: 60 tablet, Rfl: 6 .  haloperidol decanoate (HALDOL DECANOATE) 100 MG/ML injection, INJECT 1 ML EVERY 4 WEEKS, Disp: , Rfl:  .  isosorbide-hydrALAZINE (BIDIL) 20-37.5 MG  tablet, TAKE 2 TABLETS BY MOUTH 3 (THREE) TIMES DAILY.(AM, NOON, BEDTIME), Disp: 180 tablet, Rfl: 6 .  losartan (COZAAR) 25 MG tablet, TAKE ONE TABLET BY MOUTH ONCE DAILY (AM), Disp: 30 tablet, Rfl: 10 .  potassium chloride SA (K-DUR,KLOR-CON) 20 MEQ tablet, TAKE ONE TABLET BY MOUTH ONCE DAILY (AM), Disp: 30 tablet, Rfl: 6 .  spironolactone (ALDACTONE) 25 MG tablet, TAKE ONE TABLET BY MOUTH ONCE DAILY (BEDTIME), Disp: 30 tablet, Rfl: 5 .  Tiotropium Bromide Monohydrate (SPIRIVA RESPIMAT) 2.5 MCG/ACT AERS, Inhale 1 spray into the lungs daily., Disp: 1 Inhaler, Rfl: 5  Current Facility-Administered Medications:  .  methylPREDNISolone acetate (DEPO-MEDROL) injection 40 mg, 40 mg, Intra-articular, Once, [name], [name], MD Allergies  Allergen Reactions  . Sulfa Antibiotics Nausea Only      Social History   Socioeconomic History  . Marital status: Single    Spouse name: Not on file  . Number of children: Not on file  . Years of education: Not on file  . Highest education level: Not on file  Occupational History  . Not on file  Social Needs  . Financial resource strain: Not on file  . Food insecurity:    Worry: Not on file    Inability: Not on file  . Transportation needs:    Medical: Not on file    Non-medical: Not on file  Tobacco Use  . Smoking status: Current Every Day Smoker    Packs/day: 1.50  Years: 44.00    Pack years: 66.00    Types: Cigarettes    Start date: 23  . Smokeless tobacco: Never Used  . Tobacco comment: 1ppd as of 12/06/17 ep  Substance and Sexual Activity  . Alcohol use: No  . Drug use: No  . Sexual activity: Not on file  Lifestyle  . Physical activity:    Days per week: Not on file    Minutes per session: Not on file  . Stress: Not on file  Relationships  . Social connections:    Talks on phone: Not on file    Gets together: Not on file    Attends religious service: Not on file    Active member of club or organization: Not on file    Attends  meetings of clubs or organizations: Not on file    Relationship status: Not on file  . Intimate partner violence:    Fear of current or ex partner: Not on file    Emotionally abused: Not on file    Physically abused: Not on file    Forced sexual activity: Not on file  Other Topics Concern  . Not on file  Social History Narrative  . Not on file    Physical Exam Cardiovascular:     Rate and Rhythm: Normal rate and regular rhythm.     Pulses: Normal pulses.  Pulmonary:     Effort: Pulmonary effort is normal.     Breath sounds: Wheezing present.  Musculoskeletal: Normal range of motion.     Right lower leg: No edema.     Left lower leg: No edema.  Skin:    General: Skin is warm and dry.     Capillary Refill: Capillary refill takes less than 2 seconds.  Neurological:     Mental Status: He is alert and oriented to person, place, and time.  Psychiatric:        Mood and Affect: Mood normal.         Future Appointments  Date Time Provider [place]  10/18/2018  2:00 PM [name], MD [facility] None  12/01/2018 10:30 AM Brand Males, MD [facility] None  01/10/2019 10:30 AM [facility]CHO [facility]    BP 104/72 (BP Location: Right Arm, Patient Position: Sitting, Cuff Size: Normal)   Pulse 75   Resp 16   Wt 139 lb (63 kg)   SpO2 96%   BMI 19.94 kg/m   Weight yesterday- Did not weigh Last visit weight- 136.2 lb  Mr Boursiquot was seen at home today and reported feeling generally well. He denied chest pain, SOB, headache, dizziness, orthopnea, cough of fever. He reported being compliant with his medications over the past week and his weight has been stable. His medications were verified and his pillbox was refilled.   Jacquiline Doe, EMT 10/10/18  ACTION: Home visit completed Next visit planned for 1 week

## 2018-10-12 ENCOUNTER — Encounter (HOSPITAL_COMMUNITY): Payer: Self-pay | Admitting: Cardiology

## 2018-10-13 ENCOUNTER — Telehealth (HOSPITAL_COMMUNITY): Payer: Self-pay | Admitting: Licensed Clinical Social Worker

## 2018-10-13 NOTE — Telephone Encounter (Signed)
CSW reached out to pt to check in regarding food and medication status at this time. Pt currently getting food from [employer] and this has been helpful as he does not have to go the grocery as much.  Pt reports he has all of his medications and has no other concerns at this time.  Pt lives in a boarding house so he has roommates to keep him company- states they are all getting along ok right now.  CSW encouraged pt to reach out with any concerns and will continue to follow and assist as needed  [name], [place] Worker [facility] [phone]

## 2018-10-17 ENCOUNTER — Other Ambulatory Visit (HOSPITAL_COMMUNITY): Payer: Self-pay

## 2018-10-17 NOTE — Progress Notes (Signed)
Paramedicine Encounter    Patient ID: Gerald Nelson, male    DOB: October 10, 1950, 68 y.o.   MRN: [id number]   Patient Care Team: [name], MD as PCP - General (Internal Medicine) [name], MD as PCP - Advanced Heart Failure (Cardiology) [name], LCSW as Social Worker (Licensed Clinical Social Worker)  Patient Active Problem List   Diagnosis Date Noted  . Noncompliance 04/23/2017  . Mitral regurgitation 04/23/2017  . Thrombus - possible apical thrombus 01/2017 04/23/2017  . Hyperlipidemia 04/23/2017  . Pulmonary hypertension ([place])   . SOB (shortness of breath)   . Palliative care by specialist   . Tobacco abuse 04/19/2017  . Acute respiratory failure with hypoxia ([place]) 01/27/2017  . Hypertensive urgency 01/27/2017  . CHF (congestive heart failure) ([place]) 01/26/2017  . CKD (chronic kidney disease), stage III ([place]) 01/04/2017  . Essential hypertension 01/04/2017  . CVA (cerebral infarction) 12/12/2013  . Hyponatremia 12/06/2012  . Hiatal hernia 12/06/2012  . Syncope 12/06/2012  . Schizophrenia ([place])     Current Outpatient Medications:  .  albuterol (PROVENTIL HFA;VENTOLIN HFA) 108 (90 Base) MCG/ACT inhaler, Inhale 1-2 puffs into the lungs every 6 (six) hours as needed for wheezing or shortness of breath., Disp: , Rfl:  .  aspirin 81 MG EC tablet, TAKE 1 TABLET BY MOUTH EVERY DAY, Disp: 30 tablet, Rfl: 1 .  atorvastatin (LIPITOR) 40 MG tablet, TAKE ONE TABLET BY MOUTH EVERY DAY IN THE EVENING, Disp: 30 tablet, Rfl: 6 .  bisoprolol (ZEBETA) 10 MG tablet, TAKE 1 TABLET (10 MG TOTAL) BY MOUTH DAILY., Disp: 90 tablet, Rfl: 0 .  furosemide (LASIX) 40 MG tablet, Take 1 tablet (40 mg total) by mouth daily. If weight goes up 2-3lbs in a day or 3-4lbs in a week, increase to 40mg  in AM AND 20mg  in the PM, Disp: 60 tablet, Rfl: 6 .  haloperidol decanoate (HALDOL DECANOATE) 100 MG/ML injection, INJECT 1 ML EVERY 4 WEEKS, Disp: , Rfl:  .  isosorbide-hydrALAZINE (BIDIL) 20-37.5 MG  tablet, TAKE 2 TABLETS BY MOUTH 3 (THREE) TIMES DAILY.(AM, NOON, BEDTIME), Disp: 180 tablet, Rfl: 6 .  losartan (COZAAR) 25 MG tablet, TAKE ONE TABLET BY MOUTH ONCE DAILY (AM), Disp: 30 tablet, Rfl: 10 .  potassium chloride SA (K-DUR,KLOR-CON) 20 MEQ tablet, TAKE ONE TABLET BY MOUTH ONCE DAILY (AM), Disp: 30 tablet, Rfl: 6 .  spironolactone (ALDACTONE) 25 MG tablet, TAKE ONE TABLET BY MOUTH ONCE DAILY (BEDTIME), Disp: 30 tablet, Rfl: 5 .  Tiotropium Bromide Monohydrate (SPIRIVA RESPIMAT) 2.5 MCG/ACT AERS, Inhale 1 spray into the lungs daily., Disp: 1 Inhaler, Rfl: 5  Current Facility-Administered Medications:  .  methylPREDNISolone acetate (DEPO-MEDROL) injection 40 mg, 40 mg, Intra-articular, Once, [name], [name], MD Allergies  Allergen Reactions  . Sulfa Antibiotics Nausea Only     Social History   Socioeconomic History  . Marital status: Single    Spouse name: Not on file  . Number of children: Not on file  . Years of education: Not on file  . Highest education level: Not on file  Occupational History  . Not on file  Social Needs  . Financial resource strain: Not on file  . Food insecurity:    Worry: Not on file    Inability: Not on file  . Transportation needs:    Medical: Not on file    Non-medical: Not on file  Tobacco Use  . Smoking status: Current Every Day Smoker    Packs/day: 1.50  Years: 44.00    Pack years: 66.00    Types: Cigarettes    Start date: 10  . Smokeless tobacco: Never Used  . Tobacco comment: 1ppd as of 12/06/17 ep  Substance and Sexual Activity  . Alcohol use: No  . Drug use: No  . Sexual activity: Not on file  Lifestyle  . Physical activity:    Days per week: Not on file    Minutes per session: Not on file  . Stress: Not on file  Relationships  . Social connections:    Talks on phone: Not on file    Gets together: Not on file    Attends religious service: Not on file    Active member of club or organization: Not on file    Attends  meetings of clubs or organizations: Not on file    Relationship status: Not on file  . Intimate partner violence:    Fear of current or ex partner: Not on file    Emotionally abused: Not on file    Physically abused: Not on file    Forced sexual activity: Not on file  Other Topics Concern  . Not on file  Social History Narrative  . Not on file    Physical Exam Vitals signs reviewed.  HENT:     Nose: No congestion or rhinorrhea.  Eyes:     Pupils: Pupils are equal, round, and reactive to light.  Neck:     Musculoskeletal: Normal range of motion.  Cardiovascular:     Rate and Rhythm: Normal rate and regular rhythm.     Pulses: Normal pulses.     Heart sounds: Normal heart sounds.  Pulmonary:     Effort: Pulmonary effort is normal. No respiratory distress.     Breath sounds: Normal breath sounds. No wheezing or rhonchi.  Abdominal:     Palpations: Abdomen is soft.  Musculoskeletal: Normal range of motion.     Right lower leg: No edema.     Left lower leg: No edema.  Skin:    General: Skin is warm and dry.     Capillary Refill: Capillary refill takes less than 2 seconds.  Neurological:     Mental Status: He is alert. Mental status is at baseline.  Psychiatric:        Mood and Affect: Mood normal.        Behavior: Behavior normal.      Arrived to see Gerald Nelson today for home visit. He states he has been feeling generally well without any complaints. Patient denied dizziness, shortness of breath, pain or trouble getting around. Vitals assessed. Medications reviewed and verified with pill box filled. I advised Gerald Nelson would be back next week, but if needed anything to contact me. Home visit completed.   Medications Ordered: NONE Weight today: 137lbs Weight last visit: 139lbs    Future Appointments  Date Time Provider [place]  10/18/2018  2:00 PM [name], MD [facility] None  10/25/2018 10:00 AM [facility]-CT 1 [facility]-CT [facility]  12/01/2018 10:30 AM  Brand Males, MD [facility] None  01/10/2019 10:30 AM [facility] ECHO 5 [facility]     ACTION: Home visit completed Next visit planned for one week.

## 2018-10-18 ENCOUNTER — Other Ambulatory Visit: Payer: Self-pay

## 2018-10-18 ENCOUNTER — Ambulatory Visit (HOSPITAL_COMMUNITY)
Admission: RE | Admit: 2018-10-18 | Discharge: 2018-10-18 | Disposition: A | Discharge status: Home / Self Care | Payer: Medicare Other | Source: Ambulatory Visit | Attending: Cardiology | Admitting: Cardiology

## 2018-10-18 ENCOUNTER — Telehealth (HOSPITAL_COMMUNITY): Payer: Self-pay

## 2018-10-18 DIAGNOSIS — N183 Chronic kidney disease, stage 3 unspecified: Secondary | ICD-10-CM

## 2018-10-18 DIAGNOSIS — I5022 Chronic systolic (congestive) heart failure: Secondary | ICD-10-CM

## 2018-10-18 MED ORDER — LOSARTAN POTASSIUM 25 MG PO TABS: 25.0000 mg | ORAL_TABLET | Freq: Two times a day (BID) | ORAL | 3 refills | Status: DC

## 2018-10-18 NOTE — Patient Instructions (Addendum)
1. Increase losartan to 25 mg bid (have paramedicine help him with this). Edwyna Ready aware  2. BMET 10 days. 27 April @ 1215  3. Followup with echo in 2-3 months (need to reschedule echo, was canceled for coronavirus). /routed to [place]

## 2018-10-18 NOTE — Telephone Encounter (Signed)
Revieed AVS:  1. Increase losartan to 25 mg bid (have paramedicine help him with this). Edwyna Ready aware  2. BMET 10 days. 27 April @ 1215  3. Followup with echo in 2-3 months (need to reschedule echo, was canceled for coronavirus). /routed to dawne

## 2018-10-19 ENCOUNTER — Other Ambulatory Visit (HOSPITAL_COMMUNITY): Payer: Self-pay

## 2018-10-19 NOTE — Progress Notes (Signed)
Went by to place 2nd dose of Losartan in Gerald Nelson pill box as instructed. Gerald Nelson now taking Losartan 25mg  two times daily.  Will follow up next week for home visit.

## 2018-10-19 NOTE — Progress Notes (Signed)
Heart Failure TeleHealth Note  Due to national recommendations of social distancing due to [place] 19, Audio/video telehealth visit is felt to be most appropriate for this patient at this time.  See MyChart message from today for patient consent regarding telehealth for [facility].  Date:  10/19/2018   ID:  Gerald Nelson, DOB 22-Feb-1951, MRN [id number]  Location: Home  Provider location: [place] [facility] Type of Visit: Established patient   PCP:  [name], MD  Cardiologist:  Dr. Aundra Dubin  Chief Complaint: Shortness of breath   History of Present Illness: Gerald Nelson is a 68 y.o. male who presents via audio/video conferencing for a telehealth visit today.   We were unable to connect to a videoconference so had to use audio only.   he denies symptoms worrisome for COVID 19.   Patient has a past medical history of chronic systolic CHF (EF 26% in July 2018), severe MR/TR, pulmonary HTN, CVA, CKD, schizophrenia, HTN and tobacco abuse.   Mr. Teschner was admitted to [facility] 7/2-01/06/17 for newly diagnosed acute systolic CHF. Echo showed LVEF 15%, severe global hypokinesis, moderate LVH, coarsetrabeculation of the LV apex with numerous false tendinae of the left ventricle, very stagnant blood flow at the LV apex withsmoke but no obvious LV thrombus - Definity contrast was given,again, noting stagnant apical blood flow - this could suggestrecent thrombus and certainly high risk for apical thrombusformation. Other findings include aortic sclerosis with mild AI,moderate to severe MR, moderate LAE, mild RAE, moderate to severeTR, moderate to severe pulmonary hypertension (RVSP 73 mmHg), dilated IVC, trivial posterior pericardial effusion.   He was admitted again in 10/18. He diuresed 25 pounds on IV lasix. He refused cath. His clonidine and diltazem were stopped during this admission. Referred to paramedicine. Discharge weight 133 pounds.   Echo 08/11/17 LVEF 15-20%,  no MR noted.   He is still smoking, no change.  No dyspnea walking about 2 blocks.  No orthopnea/PND.  No chest pain.  No palpitations or lightheadedness.  He is taking all his medications, paramedicine helping.    Labs (6/19): K 3.8, creatinine 2.2 Labs (11/19): K 4.1, creatinine 1.95, LDL 54, HDl 54 Labs (12/19): K 4.6, creatinine 1.89 LabS (2/20): K 4.5, creatinine 1.68   PMH: 1. Chronic systolic CHF:  Cardiomyopathy of uncertain etiology, refused cath. He also refused ICD.  - Echo (7/18): Moderate LVH, severe FBSH, trabeculation at apex not meeting criteria for noncompaction, EF 15%, moderate to severe MR, low normal RV systolic function, moderate-severe TR.  - Echo (2/19): Moderate LVH, EF 15-20%, No mitral regurgitation, normal RV size and systolic function, mild TR.  2. H/o CVA 3. Schizophrenia 4. CKD stage 3 5. Mitral regurgitation: Suspect functional. Moderate-severe MR on 7/18 echo, minimal MR on 2/19 echo.  6. COPD: He is an active smoker.  - PFTs (12/18) suggestive of severe COPD.  7. H/o HTN 8. ? apical mural thrombus: Not seen on most recent echo in 2/19.  9. CAD: 1/19 CT chest showed coronary calcification.   Current Outpatient Medications  Medication Sig Dispense Refill  . albuterol (PROVENTIL HFA;VENTOLIN HFA) 108 (90 Base) MCG/ACT inhaler Inhale 1-2 puffs into the lungs every 6 (six) hours as needed for wheezing or shortness of breath.    Marland Kitchen aspirin 81 MG EC tablet TAKE 1 TABLET BY MOUTH EVERY DAY 30 tablet 1  . atorvastatin (LIPITOR) 40 MG tablet TAKE ONE TABLET BY MOUTH EVERY DAY IN THE EVENING 30 tablet  6  . bisoprolol (ZEBETA) 10 MG tablet TAKE 1 TABLET (10 MG TOTAL) BY MOUTH DAILY. 90 tablet 0  . furosemide (LASIX) 40 MG tablet Take 1 tablet (40 mg total) by mouth daily. If weight goes up 2-3lbs in a day or 3-4lbs in a week, increase to 40mg  in AM AND 20mg  in the PM 60 tablet 6  . haloperidol decanoate (HALDOL DECANOATE) 100 MG/ML injection INJECT 1 ML EVERY 4  WEEKS    . isosorbide-hydrALAZINE (BIDIL) 20-37.5 MG tablet TAKE 2 TABLETS BY MOUTH 3 (THREE) TIMES DAILY.(AM, NOON, BEDTIME) 180 tablet 6  . losartan (COZAAR) 25 MG tablet Take 1 tablet (25 mg total) by mouth 2 (two) times daily. 180 tablet 3  . potassium chloride SA (K-DUR,KLOR-CON) 20 MEQ tablet TAKE ONE TABLET BY MOUTH ONCE DAILY (AM) 30 tablet 6  . spironolactone (ALDACTONE) 25 MG tablet TAKE ONE TABLET BY MOUTH ONCE DAILY (BEDTIME) 30 tablet 5  . Tiotropium Bromide Monohydrate (SPIRIVA RESPIMAT) 2.5 MCG/ACT AERS Inhale 1 spray into the lungs daily. 1 Inhaler 5   Current Facility-Administered Medications  Medication Dose Route Frequency Provider Last Rate Last Dose  . methylPREDNISolone acetate (DEPO-MEDROL) injection 40 mg  40 mg Intra-articular Once [name], [name], MD        Allergies:   Sulfa antibiotics   Social History:  The patient  reports that he has been smoking cigarettes. He started smoking about 46 years ago. He has a 66.00 pack-year smoking history. He has never used smokeless tobacco. He reports that he does not drink alcohol or use drugs.   Family History:  The patient's family history includes Hypertension in his father and mother.   ROS:  Please see the history of present illness.   All other systems are personally reviewed and negative.   Exam:  (Video/Tele Health Call; Exam is subjective and or/visual.) BP 120/88, HR 72, wt 134 (obtained by paramedicine) General:  Speaks in full sentences. No resp difficulty. Lungs: Normal respiratory effort with conversation.  Abdomen: Non-distended per patient report Extremities: Pt denies edema. Neuro: Alert & oriented x 3.   Recent Labs: 05/09/2018: ALT 22 08/17/2018: BUN 23; Creatinine, Ser 1.68; Potassium 4.5; Sodium 138  Personally reviewed   Wt Readings from Last 3 Encounters:  10/17/18 61.1 kg (134 lb 9.6 oz)  10/10/18 63 kg (139 lb)  10/03/18 61.3 kg (135 lb 3.2 oz)      ASSESSMENT AND PLAN:  1. Chronic  systolic CHF: Echo 12/2227 EF 15% with moderate-severe MR. Echo 08/11/17 LVEF 15-20%, no MR noted.  Cardiomyopathy of uncertain etiology, he has refused cath in the past.  NYHA class II symptoms.  Weight and symptoms stable.  Creatinine 1.68 at last check.  - He has been reluctant to have heart cath, will not discuss at this time given limitations to doing elective procedures with coronavirus.    - Continue Lasix 40 mg daily.  - Continue bisoprolol 10 mg daily.   - Increase losartan to 25 mg bid, repeat BMET in 10 days. If he tolerates increase, think we could transition to [facility] eventually.   - Continue spironolactone 25 mg daily.  - Continue Bidil 2 tabs tid.  - I will arrange for repeat echo at followup appt.  - He saw EP and refused ICD.  He is not a candidate for CRT.  2. Questionable apical thrombus: Not seen on most recent echo in 2/19. He is not anticoagulated.  3. Mitral regurgitation: Moderate to severe on 7/18 echo but  minimal on 2/19 echo.  Suspect functional MR.  No significant murmur on exam today. 4. COPD: He continues to smoke.  Severe COPD by prior PFTs.  - I again encouraged him to quit.  5. CKD stage III: BMET in 10 days.  6. Schizophrenia: Affects compliance with medical care.  Continue paramedicine, this seems to be working well.  7. CAD: Noted on CT chest.  Concern for ischemic cardiomyopathy as above.  - Continue ASA 81.  - Continue atorvastatin, good LDL in 11/19.  - As above, recommended cath in past for diagnosis of CAD, will readdress at followup when we can do elective procedures.   COVID screen The patient does not have any symptoms that suggest any further testing/ screening at this time.  Social distancing reinforced today.  Relevant cardiac medications were reviewed at length with the patient today.   The patient does not have concerns regarding their medications at this time.   Recommended follow-up:  2-3 months with echo  Today, I have spent 21 minutes  with the patient with telehealth technology discussing the above issues .    Signed, Loralie Champagne, MD  10/19/2018 12:25 PM  [place] [address] [facility] [place] [address] [phone] (office) [phone] (fax)

## 2018-10-24 ENCOUNTER — Telehealth: Payer: Self-pay | Admitting: *Deleted

## 2018-10-24 ENCOUNTER — Other Ambulatory Visit (HOSPITAL_COMMUNITY): Payer: Self-pay

## 2018-10-24 NOTE — Progress Notes (Signed)
Paramedicine Encounter    Patient ID: Gerald Nelson, male    DOB: 04/26/1951, 68 y.o.   MRN: [id number]   Patient Care Team: [name], MD as PCP - General (Internal Medicine) [name], MD as PCP - Advanced Heart Failure (Cardiology) [name], LCSW as Social Worker (Licensed Clinical Social Worker)  Patient Active Problem List   Diagnosis Date Noted  . Noncompliance 04/23/2017  . Mitral regurgitation 04/23/2017  . Thrombus - possible apical thrombus 01/2017 04/23/2017  . Hyperlipidemia 04/23/2017  . Pulmonary hypertension ([place])   . SOB (shortness of breath)   . Palliative care by specialist   . Tobacco abuse 04/19/2017  . Acute respiratory failure with hypoxia ([place]) 01/27/2017  . Hypertensive urgency 01/27/2017  . CHF (congestive heart failure) ([place]) 01/26/2017  . CKD (chronic kidney disease), stage III ([place]) 01/04/2017  . Essential hypertension 01/04/2017  . CVA (cerebral infarction) 12/12/2013  . Hyponatremia 12/06/2012  . Hiatal hernia 12/06/2012  . Syncope 12/06/2012  . Schizophrenia ([place])     Current Outpatient Medications:  .  albuterol (PROVENTIL HFA;VENTOLIN HFA) 108 (90 Base) MCG/ACT inhaler, Inhale 1-2 puffs into the lungs every 6 (six) hours as needed for wheezing or shortness of breath., Disp: , Rfl:  .  aspirin 81 MG EC tablet, TAKE 1 TABLET BY MOUTH EVERY DAY, Disp: 30 tablet, Rfl: 1 .  atorvastatin (LIPITOR) 40 MG tablet, TAKE ONE TABLET BY MOUTH EVERY DAY IN THE EVENING, Disp: 30 tablet, Rfl: 6 .  bisoprolol (ZEBETA) 10 MG tablet, TAKE 1 TABLET (10 MG TOTAL) BY MOUTH DAILY., Disp: 90 tablet, Rfl: 0 .  furosemide (LASIX) 40 MG tablet, Take 1 tablet (40 mg total) by mouth daily. If weight goes up 2-3lbs in a day or 3-4lbs in a week, increase to 84m in AM AND 256min the PM, Disp: 60 tablet, Rfl: 6 .  haloperidol decanoate (HALDOL DECANOATE) 100 MG/ML injection, INJECT 1 ML EVERY 4 WEEKS, Disp: , Rfl:  .  isosorbide-hydrALAZINE (BIDIL) 20-37.5 MG  tablet, TAKE 2 TABLETS BY MOUTH 3 (THREE) TIMES DAILY.(AM, NOON, BEDTIME), Disp: 180 tablet, Rfl: 6 .  losartan (COZAAR) 25 MG tablet, Take 1 tablet (25 mg total) by mouth 2 (two) times daily., Disp: 180 tablet, Rfl: 3 .  potassium chloride SA (K-DUR,KLOR-CON) 20 MEQ tablet, TAKE ONE TABLET BY MOUTH ONCE DAILY (AM), Disp: 30 tablet, Rfl: 6 .  spironolactone (ALDACTONE) 25 MG tablet, TAKE ONE TABLET BY MOUTH ONCE DAILY (BEDTIME), Disp: 30 tablet, Rfl: 5 .  Tiotropium Bromide Monohydrate (SPIRIVA RESPIMAT) 2.5 MCG/ACT AERS, Inhale 1 spray into the lungs daily., Disp: 1 Inhaler, Rfl: 5  Current Facility-Administered Medications:  .  methylPREDNISolone acetate (DEPO-MEDROL) injection 40 mg, 40 mg, Intra-articular, Once, [name], [name], MD Allergies  Allergen Reactions  . Sulfa Antibiotics Nausea Only     Social History   Socioeconomic History  . Marital status: Single    Spouse name: Not on file  . Number of children: Not on file  . Years of education: Not on file  . Highest education level: Not on file  Occupational History  . Not on file  Social Needs  . Financial resource strain: Not on file  . Food insecurity:    Worry: Not on file    Inability: Not on file  . Transportation needs:    Medical: Not on file    Non-medical: Not on file  Tobacco Use  . Smoking status: Current Every Day Smoker    Packs/day:  1.50    Years: 44.00    Pack years: 66.00    Types: Cigarettes    Start date: 43  . Smokeless tobacco: Never Used  . Tobacco comment: 1ppd as of 12/06/17 ep  Substance and Sexual Activity  . Alcohol use: No  . Drug use: No  . Sexual activity: Not on file  Lifestyle  . Physical activity:    Days per week: Not on file    Minutes per session: Not on file  . Stress: Not on file  Relationships  . Social connections:    Talks on phone: Not on file    Gets together: Not on file    Attends religious service: Not on file    Active member of club or organization: Not on  file    Attends meetings of clubs or organizations: Not on file    Relationship status: Not on file  . Intimate partner violence:    Fear of current or ex partner: Not on file    Emotionally abused: Not on file    Physically abused: Not on file    Forced sexual activity: Not on file  Other Topics Concern  . Not on file  Social History Narrative  . Not on file    Physical Exam Constitutional:      Appearance: He is normal weight.  HENT:     Nose: No congestion or rhinorrhea.     Mouth/Throat:     Mouth: Mucous membranes are moist.  Eyes:     Pupils: Pupils are equal, round, and reactive to light.  Neck:     Musculoskeletal: Normal range of motion.  Cardiovascular:     Rate and Rhythm: Normal rate and regular rhythm.     Pulses: Normal pulses.     Heart sounds: Normal heart sounds.  Pulmonary:     Effort: Respiratory distress present.     Breath sounds: Wheezing present.  Abdominal:     General: Abdomen is flat.     Palpations: Abdomen is soft.  Musculoskeletal: Normal range of motion.        General: No swelling.     Right lower leg: No edema.     Left lower leg: No edema.  Skin:    General: Skin is warm and dry.     Capillary Refill: Capillary refill takes less than 2 seconds.  Neurological:     Mental Status: He is alert. Mental status is at baseline.  Psychiatric:        Mood and Affect: Mood normal.       Arrived to see Gerald Nelson who stated he is short of breath today because of the pollen. He says this happens often. He reports not have taken his inhalers today. I encouraged same. Vitals were obtained. Medications were reviewed and verified. Pill box was filled. Gerald Nelson denied any pain, dizziness or any other priority symptoms. No edema noted. Lung sounds had some slight wheezing and he states this is because he has not yet had his inhaler. I continued to encourage same. Gerald Nelson agreed to home visit next week.   Medications ordered: ([place]) Bidil  Losartan  Cost- $12.55   Home visit complete.   Future Appointments  Date Time Provider [place]  10/25/2018 10:00 AM LB[facility]-CT 1 LBCT-CT LB-CT [facility]  10/30/2018 12:15 PM [facility] LAB [facility] None  12/01/2018 10:30 AM Brand Males, MD LBP[facility]-PULCARE None  01/10/2019 10:30 AM [facility] ECHO 5 [id number]  01/16/2019 10:40  AM [name], MD [facility] None     ACTION: Home visit completed Next visit planned for one week

## 2018-10-24 NOTE — Telephone Encounter (Signed)
Covid-19 travel screening questions  Have you traveled in the last 14 days? If yes where? No Do you now or have you had a fever in the last 14 days? No Do you have any respiratory symptoms of shortness of breath or cough now or in the last 14 days? no Do you have any family members or close contacts with diagnosed or suspected Covid-19? No

## 2018-10-25 ENCOUNTER — Other Ambulatory Visit: Payer: Self-pay

## 2018-10-25 ENCOUNTER — Ambulatory Visit (INDEPENDENT_AMBULATORY_CARE_PROVIDER_SITE_OTHER)
Admission: RE | Admit: 2018-10-25 | Discharge: 2018-10-25 | Disposition: A | Discharge status: Home / Self Care | Payer: Medicare Other | Source: Ambulatory Visit | Attending: Acute Care | Admitting: Acute Care

## 2018-10-25 DIAGNOSIS — R918 Other nonspecific abnormal finding of lung field: Secondary | ICD-10-CM | POA: Diagnosis not present

## 2018-10-25 DIAGNOSIS — Z122 Encounter for screening for malignant neoplasm of respiratory organs: Secondary | ICD-10-CM

## 2018-10-25 DIAGNOSIS — F17210 Nicotine dependence, cigarettes, uncomplicated: Secondary | ICD-10-CM

## 2018-10-25 DIAGNOSIS — Z87891 Personal history of nicotine dependence: Secondary | ICD-10-CM

## 2018-10-25 NOTE — Progress Notes (Signed)
error 

## 2018-10-26 ENCOUNTER — Other Ambulatory Visit: Payer: Self-pay | Admitting: Acute Care

## 2018-10-26 DIAGNOSIS — F17210 Nicotine dependence, cigarettes, uncomplicated: Secondary | ICD-10-CM

## 2018-10-26 DIAGNOSIS — Z122 Encounter for screening for malignant neoplasm of respiratory organs: Secondary | ICD-10-CM

## 2018-10-26 DIAGNOSIS — Z87891 Personal history of nicotine dependence: Secondary | ICD-10-CM

## 2018-10-30 ENCOUNTER — Ambulatory Visit (HOSPITAL_COMMUNITY)
Admission: RE | Admit: 2018-10-30 | Discharge: 2018-10-30 | Disposition: A | Discharge status: Home / Self Care | Payer: Medicare Other | Source: Ambulatory Visit | Attending: Cardiology | Admitting: Cardiology

## 2018-10-30 ENCOUNTER — Other Ambulatory Visit: Payer: Self-pay

## 2018-10-30 DIAGNOSIS — I5022 Chronic systolic (congestive) heart failure: Secondary | ICD-10-CM

## 2018-10-30 LAB — BASIC METABOLIC PANEL
Anion gap: 8 (ref 5–15)
BUN: 21 mg/dL (ref 8–23)
CO2: 27 mmol/L (ref 22–32)
Calcium: 9.7 mg/dL (ref 8.9–10.3)
Chloride: 102 mmol/L (ref 98–111)
Creatinine, Ser: 1.58 mg/dL — ABNORMAL HIGH (ref 0.61–1.24)
GFR calc Af Amer: 52 mL/min — ABNORMAL LOW (ref >60)
GFR calc non Af Amer: 45 mL/min — ABNORMAL LOW (ref >60)
Glucose, Bld: 110 mg/dL — ABNORMAL HIGH (ref 70–99)
Potassium: 4.5 mmol/L (ref 3.5–5.1)
Sodium: 137 mmol/L (ref 135–145)

## 2018-11-01 ENCOUNTER — Other Ambulatory Visit (HOSPITAL_COMMUNITY): Payer: Self-pay

## 2018-11-01 NOTE — Progress Notes (Signed)
Paramedicine Encounter    Patient ID: Gerald Nelson, male    DOB: 03-31-1951, 68 y.o.   MRN: [id number]   Patient Care Team: [name], MD as PCP - General (Internal Medicine) [name], MD as PCP - Advanced Heart Failure (Cardiology) [name], LCSW as Social Worker (Licensed Clinical Social Worker)  Patient Active Problem List   Diagnosis Date Noted  . Noncompliance 04/23/2017  . Mitral regurgitation 04/23/2017  . Thrombus - possible apical thrombus 01/2017 04/23/2017  . Hyperlipidemia 04/23/2017  . Pulmonary hypertension ([place])   . SOB (shortness of breath)   . Palliative care by specialist   . Tobacco abuse 04/19/2017  . Acute respiratory failure with hypoxia ([place]) 01/27/2017  . Hypertensive urgency 01/27/2017  . CHF (congestive heart failure) ([place]) 01/26/2017  . CKD (chronic kidney disease), stage III ([place]) 01/04/2017  . Essential hypertension 01/04/2017  . CVA (cerebral infarction) 12/12/2013  . Hyponatremia 12/06/2012  . Hiatal hernia 12/06/2012  . Syncope 12/06/2012  . Schizophrenia ([place])     Current Outpatient Medications:  .  albuterol (PROVENTIL HFA;VENTOLIN HFA) 108 (90 Base) MCG/ACT inhaler, Inhale 1-2 puffs into the lungs every 6 (six) hours as needed for wheezing or shortness of breath., Disp: , Rfl:  .  aspirin 81 MG EC tablet, TAKE 1 TABLET BY MOUTH EVERY DAY, Disp: 30 tablet, Rfl: 1 .  atorvastatin (LIPITOR) 40 MG tablet, TAKE ONE TABLET BY MOUTH EVERY DAY IN THE EVENING, Disp: 30 tablet, Rfl: 6 .  bisoprolol (ZEBETA) 10 MG tablet, TAKE 1 TABLET (10 MG TOTAL) BY MOUTH DAILY., Disp: 90 tablet, Rfl: 0 .  furosemide (LASIX) 40 MG tablet, Take 1 tablet (40 mg total) by mouth daily. If weight goes up 2-3lbs in a day or 3-4lbs in a week, increase to 40mg  in AM AND 20mg  in the PM, Disp: 60 tablet, Rfl: 6 .  isosorbide-hydrALAZINE (BIDIL) 20-37.5 MG tablet, TAKE 2 TABLETS BY MOUTH 3 (THREE) TIMES DAILY.(AM, NOON, BEDTIME), Disp: 180 tablet, Rfl: 6 .   losartan (COZAAR) 25 MG tablet, Take 1 tablet (25 mg total) by mouth 2 (two) times daily., Disp: 180 tablet, Rfl: 3 .  potassium chloride SA (K-DUR,KLOR-CON) 20 MEQ tablet, TAKE ONE TABLET BY MOUTH ONCE DAILY (AM), Disp: 30 tablet, Rfl: 6 .  spironolactone (ALDACTONE) 25 MG tablet, TAKE ONE TABLET BY MOUTH ONCE DAILY (BEDTIME), Disp: 30 tablet, Rfl: 5 .  Tiotropium Bromide Monohydrate (SPIRIVA RESPIMAT) 2.5 MCG/ACT AERS, Inhale 1 spray into the lungs daily., Disp: 1 Inhaler, Rfl: 5 .  haloperidol decanoate (HALDOL DECANOATE) 100 MG/ML injection, INJECT 1 ML EVERY 4 WEEKS, Disp: , Rfl:   Current Facility-Administered Medications:  .  methylPREDNISolone acetate (DEPO-MEDROL) injection 40 mg, 40 mg, Intra-articular, Once, [name], [name], MD Allergies  Allergen Reactions  . Sulfa Antibiotics Nausea Only     Social History   Socioeconomic History  . Marital status: Single    Spouse name: Not on file  . Number of children: Not on file  . Years of education: Not on file  . Highest education level: Not on file  Occupational History  . Not on file  Social Needs  . Financial resource strain: Not on file  . Food insecurity:    Worry: Not on file    Inability: Not on file  . Transportation needs:    Medical: Not on file    Non-medical: Not on file  Tobacco Use  . Smoking status: Current Every Day Smoker    Packs/day:  1.50    Years: 44.00    Pack years: 66.00    Types: Cigarettes    Start date: 61  . Smokeless tobacco: Never Used  . Tobacco comment: 1ppd as of 12/06/17 ep  Substance and Sexual Activity  . Alcohol use: No  . Drug use: No  . Sexual activity: Not on file  Lifestyle  . Physical activity:    Days per week: Not on file    Minutes per session: Not on file  . Stress: Not on file  Relationships  . Social connections:    Talks on phone: Not on file    Gets together: Not on file    Attends religious service: Not on file    Active member of club or organization: Not on  file    Attends meetings of clubs or organizations: Not on file    Relationship status: Not on file  . Intimate partner violence:    Fear of current or ex partner: Not on file    Emotionally abused: Not on file    Physically abused: Not on file    Forced sexual activity: Not on file  Other Topics Concern  . Not on file  Social History Narrative  . Not on file    Physical Exam Vitals signs reviewed.  HENT:     Head: Normocephalic.     Nose: Nose normal. No congestion or rhinorrhea.     Mouth/Throat:     Mouth: Mucous membranes are moist.  Eyes:     Pupils: Pupils are equal, round, and reactive to light.  Cardiovascular:     Rate and Rhythm: Normal rate and regular rhythm.  Pulmonary:     Effort: Pulmonary effort is normal.     Breath sounds: Wheezing present. No rhonchi or rales.  Abdominal:     General: Abdomen is flat.     Palpations: Abdomen is soft.  Musculoskeletal: Normal range of motion.     Right lower leg: No edema.     Left lower leg: No edema.  Skin:    General: Skin is warm and dry.     Capillary Refill: Capillary refill takes less than 2 seconds.  Neurological:     Mental Status: He is alert. Mental status is at baseline.  Psychiatric:        Mood and Affect: Mood normal.    Arrived to home visit with Gerald Nelson who denied any pain today or shortness of breath. He was noted to be smoking a cigarette on my arrival. Gerald Nelson medications were reviewed and verified. No medications needing refills at this time. Gerald Nelson stated he was feeling good today. Vitals were obtained and reviewed. Patient agreed to next home visit. Home visit complete.   Weight today: 131.6lbs Weight last visit: 134lbs       Future Appointments  Date Time Provider [place]  12/01/2018 10:30 AM Brand Males, MD [facility]ULC[facility] None  01/10/2019 10:30 AM [facility] ECHO 5 [id number]  01/16/2019 10:40 AM [name], MD [facility] None     ACTION: Home  visit completed Next visit planned for one week.

## 2018-11-06 ENCOUNTER — Encounter: Payer: Self-pay | Admitting: Primary Care

## 2018-11-07 ENCOUNTER — Other Ambulatory Visit (HOSPITAL_COMMUNITY): Payer: Self-pay

## 2018-11-07 ENCOUNTER — Other Ambulatory Visit (HOSPITAL_COMMUNITY): Payer: Self-pay | Admitting: Cardiology

## 2018-11-07 NOTE — Progress Notes (Signed)
Paramedicine Encounter    Patient ID: Gerald Nelson, male    DOB: 04/26/1951, 68 y.o.   MRN: [id number]   Patient Care Team: [name], MD as PCP - General (Internal Medicine) [name], MD as PCP - Advanced Heart Failure (Cardiology) [name], LCSW as Social Worker (Licensed Clinical Social Worker)  Patient Active Problem List   Diagnosis Date Noted  . Noncompliance 04/23/2017  . Mitral regurgitation 04/23/2017  . Thrombus - possible apical thrombus 01/2017 04/23/2017  . Hyperlipidemia 04/23/2017  . Pulmonary hypertension ([place])   . SOB (shortness of breath)   . Palliative care by specialist   . Tobacco abuse 04/19/2017  . Acute respiratory failure with hypoxia ([place]) 01/27/2017  . Hypertensive urgency 01/27/2017  . CHF (congestive heart failure) ([place]) 01/26/2017  . CKD (chronic kidney disease), stage III ([place]) 01/04/2017  . Essential hypertension 01/04/2017  . CVA (cerebral infarction) 12/12/2013  . Hyponatremia 12/06/2012  . Hiatal hernia 12/06/2012  . Syncope 12/06/2012  . Schizophrenia ([place])     Current Outpatient Medications:  .  albuterol (PROVENTIL HFA;VENTOLIN HFA) 108 (90 Base) MCG/ACT inhaler, Inhale 1-2 puffs into the lungs every 6 (six) hours as needed for wheezing or shortness of breath., Disp: , Rfl:  .  aspirin 81 MG EC tablet, TAKE 1 TABLET BY MOUTH EVERY DAY, Disp: 30 tablet, Rfl: 1 .  atorvastatin (LIPITOR) 40 MG tablet, TAKE ONE TABLET BY MOUTH EVERY DAY IN THE EVENING, Disp: 30 tablet, Rfl: 6 .  bisoprolol (ZEBETA) 10 MG tablet, TAKE 1 TABLET (10 MG TOTAL) BY MOUTH DAILY., Disp: 90 tablet, Rfl: 0 .  furosemide (LASIX) 40 MG tablet, Take 1 tablet (40 mg total) by mouth daily. If weight goes up 2-3lbs in a day or 3-4lbs in a week, increase to 84m in AM AND 256min the PM, Disp: 60 tablet, Rfl: 6 .  haloperidol decanoate (HALDOL DECANOATE) 100 MG/ML injection, INJECT 1 ML EVERY 4 WEEKS, Disp: , Rfl:  .  isosorbide-hydrALAZINE (BIDIL) 20-37.5 MG  tablet, TAKE 2 TABLETS BY MOUTH 3 (THREE) TIMES DAILY.(AM, NOON, BEDTIME), Disp: 180 tablet, Rfl: 6 .  losartan (COZAAR) 25 MG tablet, Take 1 tablet (25 mg total) by mouth 2 (two) times daily., Disp: 180 tablet, Rfl: 3 .  potassium chloride SA (K-DUR,KLOR-CON) 20 MEQ tablet, TAKE ONE TABLET BY MOUTH ONCE DAILY (AM), Disp: 30 tablet, Rfl: 6 .  spironolactone (ALDACTONE) 25 MG tablet, TAKE ONE TABLET BY MOUTH ONCE DAILY (BEDTIME), Disp: 30 tablet, Rfl: 5 .  Tiotropium Bromide Monohydrate (SPIRIVA RESPIMAT) 2.5 MCG/ACT AERS, Inhale 1 spray into the lungs daily., Disp: 1 Inhaler, Rfl: 5  Current Facility-Administered Medications:  .  methylPREDNISolone acetate (DEPO-MEDROL) injection 40 mg, 40 mg, Intra-articular, Once, [name], [name], MD Allergies  Allergen Reactions  . Sulfa Antibiotics Nausea Only     Social History   Socioeconomic History  . Marital status: Single    Spouse name: Not on file  . Number of children: Not on file  . Years of education: Not on file  . Highest education level: Not on file  Occupational History  . Not on file  Social Needs  . Financial resource strain: Not on file  . Food insecurity:    Worry: Not on file    Inability: Not on file  . Transportation needs:    Medical: Not on file    Non-medical: Not on file  Tobacco Use  . Smoking status: Current Every Day Smoker    Packs/day:  1.50    Years: 44.00    Pack years: 66.00    Types: Cigarettes    Start date: 47  . Smokeless tobacco: Never Used  . Tobacco comment: 1ppd as of 12/06/17 ep  Substance and Sexual Activity  . Alcohol use: No  . Drug use: No  . Sexual activity: Not on file  Lifestyle  . Physical activity:    Days per week: Not on file    Minutes per session: Not on file  . Stress: Not on file  Relationships  . Social connections:    Talks on phone: Not on file    Gets together: Not on file    Attends religious service: Not on file    Active member of club or organization: Not on  file    Attends meetings of clubs or organizations: Not on file    Relationship status: Not on file  . Intimate partner violence:    Fear of current or ex partner: Not on file    Emotionally abused: Not on file    Physically abused: Not on file    Forced sexual activity: Not on file  Other Topics Concern  . Not on file  Social History Narrative  . Not on file    Physical Exam Vitals signs reviewed.  HENT:     Head: Normocephalic.     Nose: Nose normal.  Eyes:     Pupils: Pupils are equal, round, and reactive to light.  Neck:     Musculoskeletal: Normal range of motion.  Cardiovascular:     Rate and Rhythm: Normal rate and regular rhythm.     Pulses: Normal pulses.     Heart sounds: Normal heart sounds.  Pulmonary:     Effort: Pulmonary effort is normal.     Breath sounds: Normal breath sounds.  Musculoskeletal: Normal range of motion.     Right lower leg: No edema.     Left lower leg: No edema.  Skin:    Capillary Refill: Capillary refill takes less than 2 seconds.  Neurological:     Mental Status: He is alert. Mental status is at baseline.  Psychiatric:        Mood and Affect: Mood normal.     Arrived for home visit for [name]. [name] met me outside. [name] was ambulating normally without difficulty or shortness of breath. [name] denied shortness of breath more than his normal, dizziness, pain or trouble sleeping. [name] medications were reviewed and verified. Pill box was filled. Baseline vitals were obtained and as recorded. I recorded [name] medications needing refills and called them into [facility] who states they will be delivered tomorrow to his home. [name] agreed to home visit next week. Home visit complete.   Meds Ordered - Atorvastatin - Bisoprolol - Potassium - Lasix - ASA (OTC)    Future Appointments  Date Time Provider [place]  11/14/2018 10:30 AM Martyn Ehrich, NP [facility] None  01/10/2019  10:30 AM [facility] ECHO 5 [id number]  01/16/2019 10:40 AM [name], MD [facility] None     ACTION: Home visit completed Next visit planned for one week

## 2018-11-13 NOTE — Progress Notes (Signed)
$'@Patient'b$  ID: Gerald Nelson, male    DOB: 09/14/1950, 68 y.o.   MRN: [id number]  Chief Complaint  Patient presents with   Follow-up    baseline    Referring provider: [name], MD  HPI: 68 year old male, current smoker. PMH significant for COPD GOLD III, acute respiratory failure with hypoxia. Patient of Dr. Chase Caller, last seen on 06/05/18. Maintained on Symbicort and Spiriva. Low dose CT chest in April 2020 showed lung RADS 2. Recommend annual screening.   11/14/2018 Patient presents today for 6 month follow-up visit. Patient feels his breathing is baseline. Continues inhalers as prescribed, states that he misses them occasionally (approx 2-3 times a week). No issues with cost of inhaler. Continues to smoke 1ppd. Wants to quit every day, interested in nicotine patches to help. Denies cough or mucus production.    Allergies  Allergen Reactions   Sulfa Antibiotics Nausea Only    Immunization History  Administered Date(s) Administered   Influenza Inj Mdck Quad Pf 04/22/2018   Influenza, High Dose Seasonal PF 04/16/2017   Pneumococcal Conjugate-13 06/22/2017    Past Medical History:  Diagnosis Date   Apical mural thrombus    a. question of apical thrombus on echo 01/2017, pt left AMA, not felt to be anticoag candidate with noncompliance.   Arthritis    CHF (congestive heart failure) (HCC)    Chronic systolic CHF (congestive heart failure) ([place])    a. pt refused cath. EF 15% 01/2017.   CKD (chronic kidney disease), stage III (HCC)    COPD (chronic obstructive pulmonary disease) (HCC)    Hernia, hiatal    Hyperlipidemia    Hypertension    Hyponatremia    Mitral regurgitation    a. mod-severe by echo 01/2017.   Pulmonary hypertension (HCC)    Schizophrenia ([place])    Tobacco abuse     Tobacco History: Social History   Tobacco Use  Smoking Status Current Every Day Smoker   Packs/day: 1.50   Years: 44.00   Pack years: 66.00   Types:  Cigarettes   Start date: 1974  Smokeless Tobacco Never Used  Tobacco Comment   1ppd as of 12/06/17 ep   Ready to quit: Not Answered Counseling given: Not Answered Comment: 1ppd as of 12/06/17 ep   Outpatient Medications Prior to Visit  Medication Sig Dispense Refill   albuterol (PROVENTIL HFA;VENTOLIN HFA) 108 (90 Base) MCG/ACT inhaler Inhale 1-2 puffs into the lungs every 6 (six) hours as needed for wheezing or shortness of breath.     aspirin 81 MG EC tablet TAKE 1 TABLET BY MOUTH EVERY DAY 30 tablet 1   atorvastatin (LIPITOR) 40 MG tablet TAKE ONE TABLET BY MOUTH EVERY DAY IN THE EVENING 30 tablet 6   bisoprolol (ZEBETA) 10 MG tablet TAKE 1 TABLET (10 MG TOTAL) BY MOUTH DAILY. 90 tablet 0   furosemide (LASIX) 40 MG tablet TAKE 1 TABLET (40 MG TOTAL) BY MOUTH DAILY. IF WEIGHT GOES UP 2 TO 3 LBS IN A DAY OR 3 TO 4 LBS IN A WEEK, INCREASE TO 2 TABS IN AM AND 1 TAB IN THE PM 60 tablet 6   haloperidol decanoate (HALDOL DECANOATE) 100 MG/ML injection INJECT 1 ML EVERY 4 WEEKS     isosorbide-hydrALAZINE (BIDIL) 20-37.5 MG tablet TAKE 2 TABLETS BY MOUTH 3 (THREE) TIMES DAILY.(AM, NOON, BEDTIME) 180 tablet 6   losartan (COZAAR) 25 MG tablet Take 1 tablet (25 mg total) by mouth 2 (two) times daily. 180 tablet 3  potassium chloride SA (K-DUR,KLOR-CON) 20 MEQ tablet TAKE ONE TABLET BY MOUTH ONCE DAILY (AM) 30 tablet 6   spironolactone (ALDACTONE) 25 MG tablet TAKE ONE TABLET BY MOUTH ONCE DAILY (BEDTIME) 30 tablet 5   Tiotropium Bromide Monohydrate (SPIRIVA RESPIMAT) 2.5 MCG/ACT AERS Inhale 1 spray into the lungs daily. 1 Inhaler 5   Facility-Administered Medications Prior to Visit  Medication Dose Route Frequency Provider Last Rate Last Dose   methylPREDNISolone acetate (DEPO-MEDROL) injection 40 mg  40 mg Intra-articular Once [name], [name], MD        Review of Systems  Review of Systems  Constitutional: Negative.   HENT: Negative.   Respiratory: Negative for cough, shortness of  breath and wheezing.   Cardiovascular: Negative.     Physical Exam  BP 120/76 (BP Location: Left Arm, Cuff Size: Normal)    Pulse 83    Temp 97.8 F (36.6 C)    Ht '5\' 10"'$  (1.778 m)    Wt 142 lb 12.8 oz (64.8 kg)    SpO2 99%    BMI 20.49 kg/m  Physical Exam Constitutional:      Appearance: Normal appearance.  HENT:     Head: Normocephalic and atraumatic.     Right Ear: Tympanic membrane normal.     Left Ear: Tympanic membrane normal.     Mouth/Throat:     Mouth: Mucous membranes are moist.     Pharynx: Oropharynx is clear.  Cardiovascular:     Rate and Rhythm: Normal rate and regular rhythm.     Comments: No edema Pulmonary:     Effort: Pulmonary effort is normal. No respiratory distress.     Breath sounds: Rhonchi present. No wheezing.     Comments: LS with scattered dull rhonchi. No wheezing or resp distress.  Neurological:     General: No focal deficit present.     Mental Status: He is alert and oriented to person, place, and time. Mental status is at baseline.  Psychiatric:        Mood and Affect: Mood normal.        Behavior: Behavior normal.        Thought Content: Thought content normal.        Judgment: Judgment normal.      Lab Results:  CBC    Component Value Date/Time   WBC 6.8 04/18/2017 2026   RBC 4.68 04/18/2017 2026   HGB 14.4 04/18/2017 2026   HCT 42.1 04/18/2017 2026   PLT 234 04/18/2017 2026   MCV 90.0 04/18/2017 2026   MCH 30.8 04/18/2017 2026   MCHC 34.2 04/18/2017 2026   RDW 14.1 04/18/2017 2026   LYMPHSABS 1.4 01/27/2017 0744   MONOABS 0.8 01/27/2017 0744   EOSABS 0.1 01/27/2017 0744   BASOSABS 0.0 01/27/2017 0744    BMET    Component Value Date/Time   NA 137 10/30/2018 1146   K 4.5 10/30/2018 1146   CL 102 10/30/2018 1146   CO2 27 10/30/2018 1146   GLUCOSE 110 (H) 10/30/2018 1146   BUN 21 10/30/2018 1146   CREATININE 1.58 (H) 10/30/2018 1146   CALCIUM 9.7 10/30/2018 1146   GFRNONAA 45 (L) 10/30/2018 1146   GFRAA 52 (L)  10/30/2018 1146    BNP    Component Value Date/Time   BNP 2,204.0 (H) 04/18/2017 2026    ProBNP No results found for: PROBNP  Imaging: Ct Chest Lcs Nodule F/u W/o Contrast  Result Date: 10/25/2018 CLINICAL DATA:  68 year old male current smoker with 67 pack  year history of smoking. Follow-up for prior abnormal lung cancer screening examination. EXAM: CT CHEST WITHOUT CONTRAST FOR LUNG CANCER SCREENING NODULE FOLLOW-UP TECHNIQUE: Multidetector CT imaging of the chest was performed following the standard protocol without IV contrast. COMPARISON:  Low-dose lung cancer screening chest CT 07/13/2018. FINDINGS: Cardiovascular: Heart size is normal. There is no significant pericardial fluid, thickening or pericardial calcification. There is aortic atherosclerosis, as well as atherosclerosis of the great vessels of the mediastinum and the coronary arteries, including calcified atherosclerotic plaque in the left anterior descending, left circumflex and right coronary arteries. Mediastinum/Nodes: No pathologically enlarged mediastinal or hilar lymph nodes. Please note that accurate exclusion of hilar adenopathy is limited on noncontrast CT scans. Patulous esophagus. No axillary lymphadenopathy. Lungs/Pleura: Previously noted pulmonary nodules appear stable in size to the prior examination, largest of which is in the left upper lobe (axial image 130 of series 3), with a volume derived mean diameter of 7.7 mm. No larger more suspicious appearing pulmonary nodules or masses are noted. No acute consolidative airspace disease. No pleural effusions. Mild diffuse bronchial wall thickening with moderate centrilobular and paraseptal emphysema. Upper Abdomen: Aortic atherosclerosis. Musculoskeletal: There are no aggressive appearing lytic or blastic lesions noted in the visualized portions of the skeleton. IMPRESSION: 1. Lung-RADS 2S, benign appearance or behavior. Continue annual screening with low-dose chest CT without  contrast in 12 months. 2. The "S" modifier above refers to potentially clinically significant non lung cancer related findings. Specifically, there is aortic atherosclerosis, in addition to 3 vessel coronary artery disease. Please note that although the presence of coronary artery calcium documents the presence of coronary artery disease, the severity of this disease and any potential stenosis cannot be assessed on this non-gated CT examination. Assessment for potential risk factor modification, dietary therapy or pharmacologic therapy may be warranted, if clinically indicated. 3. Mild diffuse bronchial wall thickening with moderate centrilobular and paraseptal emphysema; imaging findings suggestive of underlying COPD. Aortic Atherosclerosis (ICD10-I70.0) and Emphysema (ICD10-J43.9). Electronically Signed   By: Vinnie Langton M.D.   On: 10/25/2018 13:33     Assessment & Plan:   Stage 3 severe COPD by GOLD classification ([place]) - Symptoms baseline - Advised enhanced compliance with inhalers - Continue Symbicort and Spiriva as prescribed - Follow up in 4-6 months with Dr. Chase Caller   Tobacco abuse - Smokes 1 ppd, interested in quitting - Discussed smoking cessation at length - RX nicotine patch starter kit - Annual LDCT screening   Martyn Ehrich, NP 11/14/2018

## 2018-11-14 ENCOUNTER — Other Ambulatory Visit: Payer: Self-pay

## 2018-11-14 ENCOUNTER — Ambulatory Visit (INDEPENDENT_AMBULATORY_CARE_PROVIDER_SITE_OTHER): Payer: Medicare Other | Admitting: Primary Care

## 2018-11-14 ENCOUNTER — Other Ambulatory Visit (HOSPITAL_COMMUNITY): Payer: Self-pay

## 2018-11-14 ENCOUNTER — Encounter: Payer: Self-pay | Admitting: Primary Care

## 2018-11-14 VITALS — BP 120/76 | HR 83 | Temp 97.8°F | Ht 70.0 in | Wt 142.8 lb

## 2018-11-14 DIAGNOSIS — J449 Chronic obstructive pulmonary disease, unspecified: Secondary | ICD-10-CM | POA: Diagnosis not present

## 2018-11-14 DIAGNOSIS — Z720 Tobacco use: Secondary | ICD-10-CM

## 2018-11-14 MED ORDER — NICOTINE 21-14-7 MG/24HR TD KIT: 1.0000 | PACK | Freq: Every day | TRANSDERMAL | 1 refills | Status: DC

## 2018-11-14 NOTE — Patient Instructions (Addendum)
Continue Symbicort twice daily Continue Spiriva once daily Use albuterol rescue inhaler 2 puffs every 4-6 hours as needed for shortness of breath/wheezing  Continue to working on smoking cessation - YOU GOT THIS!!!!!!  RX: Nicotine patch sent to pharmacy   Follow-up: 4-6 months with Dr. Chase Caller     Steps to Quit Smoking  Smoking tobacco can be bad for your health. It can also affect almost every organ in your body. Smoking puts you and people around you at risk for many serious long-lasting (chronic) diseases. Quitting smoking is hard, but it is one of the best things that you can do for your health. It is never too late to quit. What are the benefits of quitting smoking? When you quit smoking, you lower your risk for getting serious diseases and conditions. They can include:  Lung cancer or lung disease.  Heart disease.  Stroke.  Heart attack.  Not being able to have children (infertility).  Weak bones (osteoporosis) and broken bones (fractures). If you have coughing, wheezing, and shortness of breath, those symptoms may get better when you quit. You may also get sick less often. If you are pregnant, quitting smoking can help to lower your chances of having a baby of low birth weight. What can I do to help me quit smoking? Talk with your doctor about what can help you quit smoking. Some things you can do (strategies) include:  Quitting smoking totally, instead of slowly cutting back how much you smoke over a period of time.  Going to in-person counseling. You are more likely to quit if you go to many counseling sessions.  Using resources and support systems, such as: ? Database administrator with a Social worker. ? Phone quitlines. ? Careers information officer. ? Support groups or group counseling. ? Text messaging programs. ? Mobile phone apps or applications.  Taking medicines. Some of these medicines may have nicotine in them. If you are pregnant or breastfeeding, do not take  any medicines to quit smoking unless your doctor says it is okay. Talk with your doctor about counseling or other things that can help you. Talk with your doctor about using more than one strategy at the same time, such as taking medicines while you are also going to in-person counseling. This can help make quitting easier. What things can I do to make it easier to quit? Quitting smoking might feel very hard at first, but there is a lot that you can do to make it easier. Take these steps:  Talk to your family and friends. Ask them to support and encourage you.  Call phone quitlines, reach out to support groups, or work with a Social worker.  Ask people who smoke to not smoke around you.  Avoid places that make you want (trigger) to smoke, such as: ? Bars. ? Parties. ? Smoke-break areas at work.  Spend time with people who do not smoke.  Lower the stress in your life. Stress can make you want to smoke. Try these things to help your stress: ? Getting regular exercise. ? Deep-breathing exercises. ? Yoga. ? Meditating. ? Doing a body scan. To do this, close your eyes, focus on one area of your body at a time from head to toe, and notice which parts of your body are tense. Try to relax the muscles in those areas.  Download or buy apps on your mobile phone or tablet that can help you stick to your quit plan. There are many free apps, such as QuitGuide from  the CDC [employer] for Disease Control and Prevention). You can find more support from smokefree.gov and other websites. This information is not intended to replace advice given to you by your health care provider. Make sure you discuss any questions you have with your health care provider. Document Released: 04/17/2009 Document Revised: 02/17/2016 Document Reviewed: 11/05/2014 Elsevier Interactive Patient Education  2019 [employer].

## 2018-11-14 NOTE — Progress Notes (Signed)
Paramedicine Encounter    Patient ID: Gerald Nelson, male    DOB: [birth year], 68 y.o.   MRN: [id number]   Patient Care Team: [name], MD as PCP - General (Internal Medicine) [name], MD as PCP - Advanced Heart Failure (Cardiology) [name], LCSW as Social Worker (Licensed Clinical Social Worker)  Patient Active Problem List   Diagnosis Date Noted  . Stage 3 severe COPD by GOLD classification ([place]) 11/14/2018  . Noncompliance 04/23/2017  . Mitral regurgitation 04/23/2017  . Thrombus - possible apical thrombus 01/2017 04/23/2017  . Hyperlipidemia 04/23/2017  . Pulmonary hypertension ([place])   . SOB (shortness of breath)   . Palliative care by specialist   . Tobacco abuse 04/19/2017  . Acute respiratory failure with hypoxia ([place]) 01/27/2017  . Hypertensive urgency 01/27/2017  . CHF (congestive heart failure) ([place]) 01/26/2017  . CKD (chronic kidney disease), stage III ([place]) 01/04/2017  . Essential hypertension 01/04/2017  . CVA (cerebral infarction) 12/12/2013  . Hyponatremia 12/06/2012  . Hiatal hernia 12/06/2012  . Syncope 12/06/2012  . Schizophrenia ([place])     Current Outpatient Medications:  .  albuterol (PROVENTIL HFA;VENTOLIN HFA) 108 (90 Base) MCG/ACT inhaler, Inhale 1-2 puffs into the lungs every 6 (six) hours as needed for wheezing or shortness of breath., Disp: , Rfl:  .  aspirin 81 MG EC tablet, TAKE 1 TABLET BY MOUTH EVERY DAY, Disp: 30 tablet, Rfl: 1 .  atorvastatin (LIPITOR) 40 MG tablet, TAKE ONE TABLET BY MOUTH EVERY DAY IN THE EVENING, Disp: 30 tablet, Rfl: 6 .  bisoprolol (ZEBETA) 10 MG tablet, TAKE 1 TABLET (10 MG TOTAL) BY MOUTH DAILY., Disp: 90 tablet, Rfl: 0 .  furosemide (LASIX) 40 MG tablet, TAKE 1 TABLET (40 MG TOTAL) BY MOUTH DAILY. IF WEIGHT GOES UP 2 TO 3 LBS IN A DAY OR 3 TO 4 LBS IN A WEEK, INCREASE TO 2 TABS IN AM AND 1 TAB IN THE PM, Disp: 60 tablet, Rfl: 6 .  haloperidol decanoate (HALDOL DECANOATE) 100 MG/ML injection, INJECT 1 ML  EVERY 4 WEEKS, Disp: , Rfl:  .  isosorbide-hydrALAZINE (BIDIL) 20-37.5 MG tablet, TAKE 2 TABLETS BY MOUTH 3 (THREE) TIMES DAILY.(AM, NOON, BEDTIME), Disp: 180 tablet, Rfl: 6 .  losartan (COZAAR) 25 MG tablet, Take 1 tablet (25 mg total) by mouth 2 (two) times daily., Disp: 180 tablet, Rfl: 3 .  Nicotine 21-14-7 MG/24HR KIT, Place 1 patch onto the skin daily., Disp: 56 each, Rfl: 1 .  potassium chloride SA (K-DUR,KLOR-CON) 20 MEQ tablet, TAKE ONE TABLET BY MOUTH ONCE DAILY (AM), Disp: 30 tablet, Rfl: 6 .  spironolactone (ALDACTONE) 25 MG tablet, TAKE ONE TABLET BY MOUTH ONCE DAILY (BEDTIME), Disp: 30 tablet, Rfl: 5 .  Tiotropium Bromide Monohydrate (SPIRIVA RESPIMAT) 2.5 MCG/ACT AERS, Inhale 1 spray into the lungs daily., Disp: 1 Inhaler, Rfl: 5  Current Facility-Administered Medications:  .  methylPREDNISolone acetate (DEPO-MEDROL) injection 40 mg, 40 mg, Intra-articular, Once, [name], [name], MD Allergies  Allergen Reactions  . Sulfa Antibiotics Nausea Only     Social History   Socioeconomic History  . Marital status: Single    Spouse name: Not on file  . Number of children: Not on file  . Years of education: Not on file  . Highest education level: Not on file  Occupational History  . Not on file  Social Needs  . Financial resource strain: Not on file  . Food insecurity:    Worry: Not on file  Inability: Not on file  . Transportation needs:    Medical: Not on file    Non-medical: Not on file  Tobacco Use  . Smoking status: Current Every Day Smoker    Packs/day: 1.50    Years: 44.00    Pack years: 66.00    Types: Cigarettes    Start date: 79  . Smokeless tobacco: Never Used  . Tobacco comment: 1ppd as of 12/06/17 ep  Substance and Sexual Activity  . Alcohol use: No  . Drug use: No  . Sexual activity: Not on file  Lifestyle  . Physical activity:    Days per week: Not on file    Minutes per session: Not on file  . Stress: Not on file  Relationships  . Social  connections:    Talks on phone: Not on file    Gets together: Not on file    Attends religious service: Not on file    Active member of club or organization: Not on file    Attends meetings of clubs or organizations: Not on file    Relationship status: Not on file  . Intimate partner violence:    Fear of current or ex partner: Not on file    Emotionally abused: Not on file    Physically abused: Not on file    Forced sexual activity: Not on file  Other Topics Concern  . Not on file  Social History Narrative  . Not on file    Physical Exam   Arrived for home visit for Viral, he advised he is feeling good today. Gerald Nelson reports seeing his lung doctor today and getting a good report. He also reports getting a prescription for nicotene pataches to help him quit smoking. Vitals obtained and are as recorded. Patient used home scale with a weight of 147.8lbs. Medications were reviewed and verified. Pill box filled accordingly. Home visit complete. Patient advised Karena Addison would be returning and would follow up on her return next week.  Medications Ordered: Arlyce Harman    Future Appointments  Date Time Provider [place]  01/10/2019 10:30 AM [facility]CHO 5 [id number]  01/16/2019 10:40 AM [name], MD [facility] None     ACTION: Home visit completed Next visit planned for one week

## 2018-11-14 NOTE — Assessment & Plan Note (Signed)
-  Smokes 1 ppd, interested in quitting - Discussed smoking cessation at length - RX nicotine patch starter kit - Annual LDCT screening

## 2018-11-14 NOTE — Assessment & Plan Note (Signed)
-   Symptoms baseline - Advised enhanced compliance with inhalers - Continue Symbicort and Spiriva as prescribed - Follow up in 4-6 months with Dr. Chase Caller

## 2018-11-15 ENCOUNTER — Telehealth: Payer: Self-pay | Admitting: Primary Care

## 2018-11-15 NOTE — Telephone Encounter (Signed)
Called and spoke with pt to see if he knew who had tried calling him and pt stated that he did not know who had tried to call him. He said that they left him a message but he could not make out the person's name or the reason for the call in the message that was left.   Stated to pt when we found out who had tried to call him, we would call him back if needed and pt verbalized understanding. Nothing further needed.

## 2018-11-16 ENCOUNTER — Telehealth: Payer: Self-pay | Admitting: Primary Care

## 2018-11-16 NOTE — Telephone Encounter (Signed)
Reviewed patient's chart, it appears that [facility] sent in a Nicotine patch starter kit on 11/14/2018. Tried to find a starter patch kit with the instructions in Epic but I could not.   Beth, can you please advise on the patch dose titration?

## 2018-11-17 NOTE — Telephone Encounter (Signed)
Apply 21mg  patch qd x 6 weeks, then apply 14mg  patch qd x 2 weeks; the apply 7mg  patch qd x 2 weeks

## 2018-11-17 NOTE — Telephone Encounter (Signed)
[place] [phone]   And read B. Walsh's instructions for nicotine patch as shown in this message.  Nothing further needed.  Encounter will be closed.

## 2018-11-21 ENCOUNTER — Telehealth (HOSPITAL_COMMUNITY): Payer: Self-pay

## 2018-11-21 ENCOUNTER — Other Ambulatory Visit (HOSPITAL_COMMUNITY): Payer: Self-pay

## 2018-11-21 ENCOUNTER — Telehealth (HOSPITAL_COMMUNITY): Payer: Self-pay | Admitting: *Deleted

## 2018-11-21 NOTE — Telephone Encounter (Signed)
Dee called to let us know when filling pt's pill box today he has Plavix 75 mg daily which was started by PCP on 10/13/2018 but she is unsure why it was started, she will f/u with PCP as to reason and length of need.  Med added to his list.

## 2018-11-21 NOTE — Telephone Encounter (Signed)
I called pt schedule CHP visit for later today. We agreed on 3pm

## 2018-11-21 NOTE — Progress Notes (Signed)
Paramedicine Encounter    Patient ID: Gerald Nelson, male    DOB: [birth year], 68 y.o.   MRN: [id number]    Patient Care Team: [name], MD as PCP - General (Internal Medicine) [name], MD as PCP - Advanced Heart Failure (Cardiology) [name], LCSW as Social Worker (Licensed Clinical Social Worker)  Patient Active Problem List   Diagnosis Date Noted  . Stage 3 severe COPD by GOLD classification ([place]) 11/14/2018  . Noncompliance 04/23/2017  . Mitral regurgitation 04/23/2017  . Thrombus - possible apical thrombus 01/2017 04/23/2017  . Hyperlipidemia 04/23/2017  . Pulmonary hypertension ([place])   . SOB (shortness of breath)   . Palliative care by specialist   . Tobacco abuse 04/19/2017  . Acute respiratory failure with hypoxia ([place]) 01/27/2017  . Hypertensive urgency 01/27/2017  . CHF (congestive heart failure) ([place]) 01/26/2017  . CKD (chronic kidney disease), stage III ([place]) 01/04/2017  . Essential hypertension 01/04/2017  . CVA (cerebral infarction) 12/12/2013  . Hyponatremia 12/06/2012  . Hiatal hernia 12/06/2012  . Syncope 12/06/2012  . Schizophrenia ([place])     Current Outpatient Medications:  .  aspirin 81 MG EC tablet, TAKE 1 TABLET BY MOUTH EVERY DAY, Disp: 30 tablet, Rfl: 1 .  atorvastatin (LIPITOR) 40 MG tablet, TAKE ONE TABLET BY MOUTH EVERY DAY IN THE EVENING, Disp: 30 tablet, Rfl: 6 .  bisoprolol (ZEBETA) 10 MG tablet, TAKE 1 TABLET (10 MG TOTAL) BY MOUTH DAILY., Disp: 90 tablet, Rfl: 0 .  furosemide (LASIX) 40 MG tablet, TAKE 1 TABLET (40 MG TOTAL) BY MOUTH DAILY. IF WEIGHT GOES UP 2 TO 3 LBS IN A DAY OR 3 TO 4 LBS IN A WEEK, INCREASE TO 2 TABS IN AM AND 1 TAB IN THE PM, Disp: 60 tablet, Rfl: 6 .  isosorbide-hydrALAZINE (BIDIL) 20-37.5 MG tablet, TAKE 2 TABLETS BY MOUTH 3 (THREE) TIMES DAILY.(AM, NOON, BEDTIME), Disp: 180 tablet, Rfl: 6 .  losartan (COZAAR) 25 MG tablet, Take 1 tablet (25 mg total) by mouth 2 (two) times daily., Disp: 180 tablet, Rfl:  3 .  potassium chloride SA (K-DUR,KLOR-CON) 20 MEQ tablet, TAKE ONE TABLET BY MOUTH ONCE DAILY (AM), Disp: 30 tablet, Rfl: 6 .  spironolactone (ALDACTONE) 25 MG tablet, TAKE ONE TABLET BY MOUTH ONCE DAILY (BEDTIME), Disp: 30 tablet, Rfl: 5 .  albuterol (PROVENTIL HFA;VENTOLIN HFA) 108 (90 Base) MCG/ACT inhaler, Inhale 1-2 puffs into the lungs every 6 (six) hours as needed for wheezing or shortness of breath., Disp: , Rfl:  .  clopidogrel (PLAVIX) 75 MG tablet, Take 75 mg by mouth daily., Disp: , Rfl:  .  haloperidol decanoate (HALDOL DECANOATE) 100 MG/ML injection, INJECT 1 ML EVERY 4 WEEKS, Disp: , Rfl:  .  Nicotine 21-14-7 MG/24HR KIT, Place 1 patch onto the skin daily., Disp: 56 each, Rfl: 1 .  Tiotropium Bromide Monohydrate (SPIRIVA RESPIMAT) 2.5 MCG/ACT AERS, Inhale 1 spray into the lungs daily., Disp: 1 Inhaler, Rfl: 5  Current Facility-Administered Medications:  .  methylPREDNISolone acetate (DEPO-MEDROL) injection 40 mg, 40 mg, Intra-articular, Once, [name], [name], MD Allergies  Allergen Reactions  . Sulfa Antibiotics Nausea Only     Social History   Socioeconomic History  . Marital status: Single    Spouse name: Not on file  . Number of children: Not on file  . Years of education: Not on file  . Highest education level: Not on file  Occupational History  . Not on file  Social Needs  . Emergency planning/management officer  strain: Not on file  . Food insecurity:    Worry: Not on file    Inability: Not on file  . Transportation needs:    Medical: Not on file    Non-medical: Not on file  Tobacco Use  . Smoking status: Current Every Day Smoker    Packs/day: 1.50    Years: 44.00    Pack years: 66.00    Types: Cigarettes    Start date: 62  . Smokeless tobacco: Never Used  . Tobacco comment: 1ppd as of 12/06/17 ep  Substance and Sexual Activity  . Alcohol use: No  . Drug use: No  . Sexual activity: Not on file  Lifestyle  . Physical activity:    Days per week: Not on file     Minutes per session: Not on file  . Stress: Not on file  Relationships  . Social connections:    Talks on phone: Not on file    Gets together: Not on file    Attends religious service: Not on file    Active member of club or organization: Not on file    Attends meetings of clubs or organizations: Not on file    Relationship status: Not on file  . Intimate partner violence:    Fear of current or ex partner: Not on file    Emotionally abused: Not on file    Physically abused: Not on file    Forced sexual activity: Not on file  Other Topics Concern  . Not on file  Social History Narrative  . Not on file    Physical Exam Pulmonary:     Effort: Pulmonary effort is normal.     Breath sounds: Wheezing and rales present.     Comments: Expiratory Wheezing noted in all lung fields Musculoskeletal:        General: No swelling.     Right lower leg: No edema.     Left lower leg: No edema.  Skin:    General: Skin is warm.         Future Appointments  Date Time Provider [place]  01/10/2019 10:30 AM [facility] 5 [facility]  01/16/2019 10:40 AM [name], MD [facility] None     BP 122/80   Pulse 88   Temp 99.1 F (37.3 C)   Resp 16   Wt 142 lb (64.4 kg)   SpO2 94%   BMI 20.37 kg/m   Weight yesterday-141.9 Last visit weight-147  ATF pt CAO x 4 standing in the living room. He stated that he took this morning medications already.  Pt stated that he's now working with the [employer] to find a job.  He's also moving to another house a block from where he lives at now.  He stated that he's no longer getting along with his roommates.  Pt denies sob and difficulty breathing/ wheezing noted (pt's norm).  He stated that he hasn't used the inhaler yet, which I advised him to do so.  Pt denies chest pain and dizziness. rx bottles verifed and pill box refilled.     Medication ordered: [place], EMT  Paramedic [phone] 11/21/2018    ACTION: Home visit completed

## 2018-11-24 ENCOUNTER — Other Ambulatory Visit (HOSPITAL_COMMUNITY): Payer: Self-pay

## 2018-11-24 NOTE — Progress Notes (Signed)
Pt called me to refill his pill box again, he spilled water in it yesterday and the pills are "ruined".  He met me at the [place] address, pill box refilled. No complaints noted by pt. We agree to meet again next week.

## 2018-11-28 ENCOUNTER — Other Ambulatory Visit: Payer: Self-pay | Admitting: Internal Medicine

## 2018-11-28 ENCOUNTER — Other Ambulatory Visit (HOSPITAL_COMMUNITY): Payer: Self-pay

## 2018-11-29 NOTE — Progress Notes (Signed)
Paramedicine Encounter    Patient ID: Gerald Nelson, male    DOB: [birth year], 68 y.o.   MRN: [id number]    Patient Care Team: [name], MD as PCP - General (Internal Medicine) [name], MD as PCP - Advanced Heart Failure (Cardiology) [name], LCSW as Social Worker (Licensed Clinical Social Worker)  Patient Active Problem List   Diagnosis Date Noted  . Stage 3 severe COPD by GOLD classification ([place]) 11/14/2018  . Noncompliance 04/23/2017  . Mitral regurgitation 04/23/2017  . Thrombus - possible apical thrombus 01/2017 04/23/2017  . Hyperlipidemia 04/23/2017  . Pulmonary hypertension ([place])   . SOB (shortness of breath)   . Palliative care by specialist   . Tobacco abuse 04/19/2017  . Acute respiratory failure with hypoxia ([place]) 01/27/2017  . Hypertensive urgency 01/27/2017  . CHF (congestive heart failure) ([place]) 01/26/2017  . CKD (chronic kidney disease), stage III ([place]) 01/04/2017  . Essential hypertension 01/04/2017  . CVA (cerebral infarction) 12/12/2013  . Hyponatremia 12/06/2012  . Hiatal hernia 12/06/2012  . Syncope 12/06/2012  . Schizophrenia ([place])     Current Outpatient Medications:  .  aspirin 81 MG EC tablet, TAKE 1 TABLET BY MOUTH EVERY DAY, Disp: 30 tablet, Rfl: 1 .  bisoprolol (ZEBETA) 10 MG tablet, TAKE 1 TABLET (10 MG TOTAL) BY MOUTH DAILY., Disp: 90 tablet, Rfl: 0 .  clopidogrel (PLAVIX) 75 MG tablet, Take 75 mg by mouth daily., Disp: , Rfl:  .  furosemide (LASIX) 40 MG tablet, TAKE 1 TABLET (40 MG TOTAL) BY MOUTH DAILY. IF WEIGHT GOES UP 2 TO 3 LBS IN A DAY OR 3 TO 4 LBS IN A WEEK, INCREASE TO 2 TABS IN AM AND 1 TAB IN THE PM, Disp: 60 tablet, Rfl: 6 .  haloperidol decanoate (HALDOL DECANOATE) 100 MG/ML injection, INJECT 1 ML EVERY 4 WEEKS, Disp: , Rfl:  .  losartan (COZAAR) 25 MG tablet, Take 1 tablet (25 mg total) by mouth 2 (two) times daily., Disp: 180 tablet, Rfl: 3 .  albuterol (PROVENTIL HFA;VENTOLIN HFA) 108 (90 Base) MCG/ACT inhaler,  Inhale 1-2 puffs into the lungs every 6 (six) hours as needed for wheezing or shortness of breath., Disp: , Rfl:  .  atorvastatin (LIPITOR) 40 MG tablet, TAKE ONE TABLET BY MOUTH EVERY DAY IN THE EVENING, Disp: 30 tablet, Rfl: 6 .  isosorbide-hydrALAZINE (BIDIL) 20-37.5 MG tablet, TAKE 2 TABLETS BY MOUTH 3 (THREE) TIMES DAILY.(AM, NOON, BEDTIME), Disp: 180 tablet, Rfl: 6 .  Nicotine 21-14-7 MG/24HR KIT, Place 1 patch onto the skin daily., Disp: 56 each, Rfl: 1 .  potassium chloride SA (K-DUR,KLOR-CON) 20 MEQ tablet, TAKE ONE TABLET BY MOUTH ONCE DAILY (AM), Disp: 30 tablet, Rfl: 6 .  SPIRIVA RESPIMAT 2.5 MCG/ACT AERS, INHALE 1 SPRAY INTO THE LUNGS DAILY., Disp: 4 g, Rfl: 5 .  spironolactone (ALDACTONE) 25 MG tablet, TAKE ONE TABLET BY MOUTH ONCE DAILY (BEDTIME), Disp: 30 tablet, Rfl: 5  Current Facility-Administered Medications:  .  methylPREDNISolone acetate (DEPO-MEDROL) injection 40 mg, 40 mg, Intra-articular, Once, [name], [name], MD Allergies  Allergen Reactions  . Sulfa Antibiotics Nausea Only     Social History   Socioeconomic History  . Marital status: Single    Spouse name: Not on file  . Number of children: Not on file  . Years of education: Not on file  . Highest education level: Not on file  Occupational History  . Not on file  Social Needs  . Financial resource strain: Not on  file  . Food insecurity:    Worry: Not on file    Inability: Not on file  . Transportation needs:    Medical: Not on file    Non-medical: Not on file  Tobacco Use  . Smoking status: Current Every Day Smoker    Packs/day: 1.50    Years: 44.00    Pack years: 66.00    Types: Cigarettes    Start date: 75  . Smokeless tobacco: Never Used  . Tobacco comment: 1ppd as of 12/06/17 ep  Substance and Sexual Activity  . Alcohol use: No  . Drug use: No  . Sexual activity: Not on file  Lifestyle  . Physical activity:    Days per week: Not on file    Minutes per session: Not on file  . Stress:  Not on file  Relationships  . Social connections:    Talks on phone: Not on file    Gets together: Not on file    Attends religious service: Not on file    Active member of club or organization: Not on file    Attends meetings of clubs or organizations: Not on file    Relationship status: Not on file  . Intimate partner violence:    Fear of current or ex partner: Not on file    Emotionally abused: Not on file    Physically abused: Not on file    Forced sexual activity: Not on file  Other Topics Concern  . Not on file  Social History Narrative  . Not on file    Physical Exam Pulmonary:     Effort: Pulmonary effort is normal. No respiratory distress.     Breath sounds: Wheezing present. No rales.     Comments: Both lower lobes Musculoskeletal:     Right lower leg: No edema.     Left lower leg: No edema.  Skin:    General: Skin is warm and dry.         Future Appointments  Date Time Provider [place]  01/10/2019 10:30 AM [facility]CHO [facility]  01/16/2019 10:40 AM [name], MD [facility] None     BP 110/70 (BP Location: Left Arm, Patient Position: Standing, Cuff Size: Normal)   Pulse 92   Temp 97.7 F (36.5 C)   Resp 12   Wt 135 lb 12.8 oz (61.6 kg)   SpO2 96%   BMI 19.49 kg/m   Weight yesterday-didn't weigh Last visit weight-135  ATF pt CAO x4 sitting on the porch smoking a cigarette. He stated that he's living back on [name].  He has taken all of his meds this week including the inhalers.  He denies sob, chest pain and dizziness.  rx bottles verified and pill box refilled.    Medication ordered: none  Zackrey Dyar, EMT Paramedic [phone] 12/06/2018    ACTION: Home visit completed

## 2018-12-01 ENCOUNTER — Ambulatory Visit: Payer: Self-pay | Admitting: Internal Medicine

## 2018-12-05 ENCOUNTER — Other Ambulatory Visit (HOSPITAL_COMMUNITY): Payer: Self-pay

## 2018-12-05 NOTE — Progress Notes (Signed)
Paramedicine Encounter    Patient ID: Gerald Nelson, male    DOB: 01/29/1951, 68 y.o.   MRN: [id number]    Patient Care Team: [name], MD as PCP - General (Internal Medicine) [name], MD as PCP - Advanced Heart Failure (Cardiology) [name], LCSW as Social Worker (Licensed Clinical Social Worker)  Patient Active Problem List   Diagnosis Date Noted  . Stage 3 severe COPD by GOLD classification ([place]) 11/14/2018  . Noncompliance 04/23/2017  . Mitral regurgitation 04/23/2017  . Thrombus - possible apical thrombus 01/2017 04/23/2017  . Hyperlipidemia 04/23/2017  . Pulmonary hypertension ([place])   . SOB (shortness of breath)   . Palliative care by specialist   . Tobacco abuse 04/19/2017  . Acute respiratory failure with hypoxia ([place]) 01/27/2017  . Hypertensive urgency 01/27/2017  . CHF (congestive heart failure) ([place]) 01/26/2017  . CKD (chronic kidney disease), stage III ([place]) 01/04/2017  . Essential hypertension 01/04/2017  . CVA (cerebral infarction) 12/12/2013  . Hyponatremia 12/06/2012  . Hiatal hernia 12/06/2012  . Syncope 12/06/2012  . Schizophrenia ([place])     Current Outpatient Medications:  .  albuterol (PROVENTIL HFA;VENTOLIN HFA) 108 (90 Base) MCG/ACT inhaler, Inhale 1-2 puffs into the lungs every 6 (six) hours as needed for wheezing or shortness of breath., Disp: , Rfl:  .  aspirin 81 MG EC tablet, TAKE 1 TABLET BY MOUTH EVERY DAY, Disp: 30 tablet, Rfl: 1 .  atorvastatin (LIPITOR) 40 MG tablet, TAKE ONE TABLET BY MOUTH EVERY DAY IN THE EVENING, Disp: 30 tablet, Rfl: 6 .  bisoprolol (ZEBETA) 10 MG tablet, TAKE 1 TABLET (10 MG TOTAL) BY MOUTH DAILY., Disp: 90 tablet, Rfl: 0 .  clopidogrel (PLAVIX) 75 MG tablet, Take 75 mg by mouth daily., Disp: , Rfl:  .  furosemide (LASIX) 40 MG tablet, TAKE 1 TABLET (40 MG TOTAL) BY MOUTH DAILY. IF WEIGHT GOES UP 2 TO 3 LBS IN A DAY OR 3 TO 4 LBS IN A WEEK, INCREASE TO 2 TABS IN AM AND 1 TAB IN THE PM, Disp: 60 tablet,  Rfl: 6 .  haloperidol decanoate (HALDOL DECANOATE) 100 MG/ML injection, INJECT 1 ML EVERY 4 WEEKS, Disp: , Rfl:  .  isosorbide-hydrALAZINE (BIDIL) 20-37.5 MG tablet, TAKE 2 TABLETS BY MOUTH 3 (THREE) TIMES DAILY.(AM, NOON, BEDTIME), Disp: 180 tablet, Rfl: 6 .  losartan (COZAAR) 25 MG tablet, Take 1 tablet (25 mg total) by mouth 2 (two) times daily., Disp: 180 tablet, Rfl: 3 .  Nicotine 21-14-7 MG/24HR KIT, Place 1 patch onto the skin daily., Disp: 56 each, Rfl: 1 .  potassium chloride SA (K-DUR,KLOR-CON) 20 MEQ tablet, TAKE ONE TABLET BY MOUTH ONCE DAILY (AM), Disp: 30 tablet, Rfl: 6 .  SPIRIVA RESPIMAT 2.5 MCG/ACT AERS, INHALE 1 SPRAY INTO THE LUNGS DAILY., Disp: 4 g, Rfl: 5 .  spironolactone (ALDACTONE) 25 MG tablet, TAKE ONE TABLET BY MOUTH ONCE DAILY (BEDTIME), Disp: 30 tablet, Rfl: 5  Current Facility-Administered Medications:  .  methylPREDNISolone acetate (DEPO-MEDROL) injection 40 mg, 40 mg, Intra-articular, Once, [name], [name], MD Allergies  Allergen Reactions  . Sulfa Antibiotics Nausea Only     Social History   Socioeconomic History  . Marital status: Single    Spouse name: Not on file  . Number of children: Not on file  . Years of education: Not on file  . Highest education level: Not on file  Occupational History  . Not on file  Social Needs  . Financial resource strain: Not on  file  . Food insecurity:    Worry: Not on file    Inability: Not on file  . Transportation needs:    Medical: Not on file    Non-medical: Not on file  Tobacco Use  . Smoking status: Current Every Day Smoker    Packs/day: 1.50    Years: 44.00    Pack years: 66.00    Types: Cigarettes    Start date: 63  . Smokeless tobacco: Never Used  . Tobacco comment: 1ppd as of 12/06/17 ep  Substance and Sexual Activity  . Alcohol use: No  . Drug use: No  . Sexual activity: Not on file  Lifestyle  . Physical activity:    Days per week: Not on file    Minutes per session: Not on file  .  Stress: Not on file  Relationships  . Social connections:    Talks on phone: Not on file    Gets together: Not on file    Attends religious service: Not on file    Active member of club or organization: Not on file    Attends meetings of clubs or organizations: Not on file    Relationship status: Not on file  . Intimate partner violence:    Fear of current or ex partner: Not on file    Emotionally abused: Not on file    Physically abused: Not on file    Forced sexual activity: Not on file  Other Topics Concern  . Not on file  Social History Narrative  . Not on file    Physical Exam Pulmonary:     Effort: No respiratory distress.     Breath sounds: Wheezing present.     Comments: Both lower lung fields Abdominal:     General: There is no distension.     Tenderness: There is no abdominal tenderness. There is no guarding.  Musculoskeletal:        General: No swelling.     Right lower leg: No edema.     Left lower leg: No edema.  Skin:    General: Skin is warm and dry.         Future Appointments  Date Time Provider [place]  01/10/2019 10:30 AM [facility]  01/16/2019 10:40 AM [name], MD [facility] None     BP 102/70 (BP Location: Left Arm, Patient Position: Standing, Cuff Size: Normal)   Pulse 89   Resp 16   Wt 140 lb (63.5 kg)   SpO2 98%   BMI 20.09 kg/m   Weight yesterday-140 Last visit weight-135  ATF pt CAO x4 sitting outside smoking a cigarette, he said that he's moved back to this house because he didn't like the other place.  He denies chest pain, sob and dizziness.  He hasn't increased the amount of pillows that he uses to sleep at night. Pt has taken all of his meds for this week.  I picked up potassium and atorvastatin from [place] before I came today. rx bottles verified and pill box refilled. I advised him to take an extra lasix tonight because he gained 5 lbs since last week.  He agrees to call me  tomorrow with his weight.  *pt is allowed to take a extra tab for weight gain per his rx bottle. I spoke with Nira Conn and she confirmed the instructions.    I called Christy Sartorius @  [facility] to ask that he takes pt off of automatic refill.  No one answered so I left a message.    Medication ordered: plavix  Daisie Haft, EMT Paramedic [phone] 12/05/2018    ACTION: Home visit completed

## 2018-12-08 ENCOUNTER — Telehealth (HOSPITAL_COMMUNITY): Payer: Self-pay | Admitting: Licensed Clinical Social Worker

## 2018-12-08 NOTE — Telephone Encounter (Signed)
CSW reached out to pt to check in regarding food and medication status at this time. Pt reports he has everything he needs at this time as his paramedic has been checking up on him.  Pt states he is planning to start a job in the next few months with [employer] and that he will hopefully get set up with an apartment through them as well- pt is looking forward to this opportunity.  CSW encouraged pt to reach out with any concerns and will continue to follow and assist as needed  [name], [place] Worker [facility] [phone]

## 2018-12-11 ENCOUNTER — Other Ambulatory Visit (HOSPITAL_COMMUNITY): Payer: Self-pay

## 2018-12-12 ENCOUNTER — Other Ambulatory Visit (HOSPITAL_COMMUNITY): Payer: Self-pay

## 2018-12-12 NOTE — Progress Notes (Signed)
Paramedicine Encounter    Patient ID: Gerald Nelson, male    DOB: August 17, 1950, 68 y.o.   MRN: [id number]    Patient Care Team: [name], MD as PCP - General (Internal Medicine) [name], MD as PCP - Advanced Heart Failure (Cardiology) [name], LCSW as Social Worker (Licensed Clinical Social Worker)  Patient Active Problem List   Diagnosis Date Noted  . Stage 3 severe COPD by GOLD classification ([place]) 11/14/2018  . Noncompliance 04/23/2017  . Mitral regurgitation 04/23/2017  . Thrombus - possible apical thrombus 01/2017 04/23/2017  . Hyperlipidemia 04/23/2017  . Pulmonary hypertension ([place])   . SOB (shortness of breath)   . Palliative care by specialist   . Tobacco abuse 04/19/2017  . Acute respiratory failure with hypoxia ([place]) 01/27/2017  . Hypertensive urgency 01/27/2017  . CHF (congestive heart failure) ([place]) 01/26/2017  . CKD (chronic kidney disease), stage III ([place]) 01/04/2017  . Essential hypertension 01/04/2017  . CVA (cerebral infarction) 12/12/2013  . Hyponatremia 12/06/2012  . Hiatal hernia 12/06/2012  . Syncope 12/06/2012  . Schizophrenia ([place])     Current Outpatient Medications:  .  aspirin 81 MG EC tablet, TAKE 1 TABLET BY MOUTH EVERY DAY, Disp: 30 tablet, Rfl: 1 .  atorvastatin (LIPITOR) 40 MG tablet, TAKE ONE TABLET BY MOUTH EVERY DAY IN THE EVENING, Disp: 30 tablet, Rfl: 6 .  bisoprolol (ZEBETA) 10 MG tablet, TAKE 1 TABLET (10 MG TOTAL) BY MOUTH DAILY., Disp: 90 tablet, Rfl: 0 .  clopidogrel (PLAVIX) 75 MG tablet, Take 75 mg by mouth daily., Disp: , Rfl:  .  furosemide (LASIX) 40 MG tablet, TAKE 1 TABLET (40 MG TOTAL) BY MOUTH DAILY. IF WEIGHT GOES UP 2 TO 3 LBS IN A DAY OR 3 TO 4 LBS IN A WEEK, INCREASE TO 2 TABS IN AM AND 1 TAB IN THE PM, Disp: 60 tablet, Rfl: 6 .  haloperidol decanoate (HALDOL DECANOATE) 100 MG/ML injection, INJECT 1 ML EVERY 4 WEEKS, Disp: , Rfl:  .  isosorbide-hydrALAZINE (BIDIL) 20-37.5 MG tablet, TAKE 2 TABLETS BY MOUTH  3 (THREE) TIMES DAILY.(AM, NOON, BEDTIME), Disp: 180 tablet, Rfl: 6 .  losartan (COZAAR) 25 MG tablet, Take 1 tablet (25 mg total) by mouth 2 (two) times daily., Disp: 180 tablet, Rfl: 3 .  potassium chloride SA (K-DUR,KLOR-CON) 20 MEQ tablet, TAKE ONE TABLET BY MOUTH ONCE DAILY (AM), Disp: 30 tablet, Rfl: 6 .  SPIRIVA RESPIMAT 2.5 MCG/ACT AERS, INHALE 1 SPRAY INTO THE LUNGS DAILY., Disp: 4 g, Rfl: 5 .  spironolactone (ALDACTONE) 25 MG tablet, TAKE ONE TABLET BY MOUTH ONCE DAILY (BEDTIME), Disp: 30 tablet, Rfl: 5 .  albuterol (PROVENTIL HFA;VENTOLIN HFA) 108 (90 Base) MCG/ACT inhaler, Inhale 1-2 puffs into the lungs every 6 (six) hours as needed for wheezing or shortness of breath., Disp: , Rfl:  .  Nicotine 21-14-7 MG/24HR KIT, Place 1 patch onto the skin daily., Disp: 56 each, Rfl: 1  Current Facility-Administered Medications:  .  methylPREDNISolone acetate (DEPO-MEDROL) injection 40 mg, 40 mg, Intra-articular, Once, [name], [name], MD Allergies  Allergen Reactions  . Sulfa Antibiotics Nausea Only     Social History   Socioeconomic History  . Marital status: Single    Spouse name: Not on file  . Number of children: Not on file  . Years of education: Not on file  . Highest education level: Not on file  Occupational History  . Not on file  Social Needs  . Financial resource strain: Not on  file  . Food insecurity:    Worry: Not on file    Inability: Not on file  . Transportation needs:    Medical: Not on file    Non-medical: Not on file  Tobacco Use  . Smoking status: Current Every Day Smoker    Packs/day: 1.50    Years: 44.00    Pack years: 66.00    Types: Cigarettes    Start date: 67  . Smokeless tobacco: Never Used  . Tobacco comment: 1ppd as of 12/06/17 ep  Substance and Sexual Activity  . Alcohol use: No  . Drug use: No  . Sexual activity: Not on file  Lifestyle  . Physical activity:    Days per week: Not on file    Minutes per session: Not on file  . Stress:  Not on file  Relationships  . Social connections:    Talks on phone: Not on file    Gets together: Not on file    Attends religious service: Not on file    Active member of club or organization: Not on file    Attends meetings of clubs or organizations: Not on file    Relationship status: Not on file  . Intimate partner violence:    Fear of current or ex partner: Not on file    Emotionally abused: Not on file    Physically abused: Not on file    Forced sexual activity: Not on file  Other Topics Concern  . Not on file  Social History Narrative  . Not on file    Physical Exam Pulmonary:     Effort: Pulmonary effort is normal. No respiratory distress.     Breath sounds: No wheezing.  Musculoskeletal:        General: No swelling.     Right lower leg: No edema.     Left lower leg: No edema.  Skin:    General: Skin is warm and dry.         Future Appointments  Date Time Provider [place]  01/10/2019 10:35 AM [facility]  01/16/2019 10:40 AM [name], MD [facility] None     BP 102/60 (BP Location: Left Arm, Patient Position: Standing, Cuff Size: Normal)   Pulse 100   Temp 97.9 F (36.6 C)   Resp 15   Wt 138 lb 6.4 oz (62.8 kg)   SpO2 96%   BMI 19.86 kg/m   Weight yesterday-didn't weigh Last visit weight-140  ATF pt CAO x4 standing outside on his porch.  He has taken all of his medications this week and this morning.  He stated that he has been using his inhaler daily; he feels a lot better.  His lungs sounds better than his normal.  Pt denies increasing the amount of pillows to sleep at night.  He has no issues with sob at night; no bleeding in bowel movements.  rx bottles verified and pill box refilled.    Medication ordered: Spirolactone Clopidogrel  Gerald Nelson, EMT Paramedic [phone] 12/12/2018    ACTION: Home visit completed

## 2018-12-19 ENCOUNTER — Other Ambulatory Visit (HOSPITAL_COMMUNITY): Payer: Self-pay

## 2018-12-19 NOTE — Progress Notes (Signed)
Paramedicine Encounter    Patient ID: Gerald Nelson, male    DOB: 06/03/1951, 68 y.o.   MRN: [id number]    Patient Care Team: [name], MD as PCP - General (Internal Medicine) [name], MD as PCP - Advanced Heart Failure (Cardiology) [name], LCSW as Social Worker (Licensed Clinical Social Worker)  Patient Active Problem List   Diagnosis Date Noted  . Stage 3 severe COPD by GOLD classification ([place]) 11/14/2018  . Noncompliance 04/23/2017  . Mitral regurgitation 04/23/2017  . Thrombus - possible apical thrombus 01/2017 04/23/2017  . Hyperlipidemia 04/23/2017  . Pulmonary hypertension ([place])   . SOB (shortness of breath)   . Palliative care by specialist   . Tobacco abuse 04/19/2017  . Acute respiratory failure with hypoxia ([place]) 01/27/2017  . Hypertensive urgency 01/27/2017  . CHF (congestive heart failure) ([place]) 01/26/2017  . CKD (chronic kidney disease), stage III ([place]) 01/04/2017  . Essential hypertension 01/04/2017  . CVA (cerebral infarction) 12/12/2013  . Hyponatremia 12/06/2012  . Hiatal hernia 12/06/2012  . Syncope 12/06/2012  . Schizophrenia ([place])     Current Outpatient Medications:  .  albuterol (PROVENTIL HFA;VENTOLIN HFA) 108 (90 Base) MCG/ACT inhaler, Inhale 1-2 puffs into the lungs every 6 (six) hours as needed for wheezing or shortness of breath., Disp: , Rfl:  .  aspirin 81 MG EC tablet, TAKE 1 TABLET BY MOUTH EVERY DAY, Disp: 30 tablet, Rfl: 1 .  atorvastatin (LIPITOR) 40 MG tablet, TAKE ONE TABLET BY MOUTH EVERY DAY IN THE EVENING, Disp: 30 tablet, Rfl: 6 .  bisoprolol (ZEBETA) 10 MG tablet, TAKE 1 TABLET (10 MG TOTAL) BY MOUTH DAILY., Disp: 90 tablet, Rfl: 0 .  clopidogrel (PLAVIX) 75 MG tablet, Take 75 mg by mouth daily., Disp: , Rfl:  .  furosemide (LASIX) 40 MG tablet, TAKE 1 TABLET (40 MG TOTAL) BY MOUTH DAILY. IF WEIGHT GOES UP 2 TO 3 LBS IN A DAY OR 3 TO 4 LBS IN A WEEK, INCREASE TO 2 TABS IN AM AND 1 TAB IN THE PM, Disp: 60 tablet,  Rfl: 6 .  haloperidol decanoate (HALDOL DECANOATE) 100 MG/ML injection, INJECT 1 ML EVERY 4 WEEKS, Disp: , Rfl:  .  isosorbide-hydrALAZINE (BIDIL) 20-37.5 MG tablet, TAKE 2 TABLETS BY MOUTH 3 (THREE) TIMES DAILY.(AM, NOON, BEDTIME), Disp: 180 tablet, Rfl: 6 .  losartan (COZAAR) 25 MG tablet, Take 1 tablet (25 mg total) by mouth 2 (two) times daily., Disp: 180 tablet, Rfl: 3 .  Nicotine 21-14-7 MG/24HR KIT, Place 1 patch onto the skin daily., Disp: 56 each, Rfl: 1 .  potassium chloride SA (K-DUR,KLOR-CON) 20 MEQ tablet, TAKE ONE TABLET BY MOUTH ONCE DAILY (AM), Disp: 30 tablet, Rfl: 6 .  SPIRIVA RESPIMAT 2.5 MCG/ACT AERS, INHALE 1 SPRAY INTO THE LUNGS DAILY., Disp: 4 g, Rfl: 5 .  spironolactone (ALDACTONE) 25 MG tablet, TAKE ONE TABLET BY MOUTH ONCE DAILY (BEDTIME), Disp: 30 tablet, Rfl: 5  Current Facility-Administered Medications:  .  methylPREDNISolone acetate (DEPO-MEDROL) injection 40 mg, 40 mg, Intra-articular, Once, [name], [name], MD Allergies  Allergen Reactions  . Sulfa Antibiotics Nausea Only     Social History   Socioeconomic History  . Marital status: Single    Spouse name: Not on file  . Number of children: Not on file  . Years of education: Not on file  . Highest education level: Not on file  Occupational History  . Not on file  Social Needs  . Financial resource strain: Not on  file  . Food insecurity    Worry: Not on file    Inability: Not on file  . Transportation needs    Medical: Not on file    Non-medical: Not on file  Tobacco Use  . Smoking status: Current Every Day Smoker    Packs/day: 1.50    Years: 44.00    Pack years: 66.00    Types: Cigarettes    Start date: 1974  . Smokeless tobacco: Never Used  . Tobacco comment: 1ppd as of 12/06/17 ep  Substance and Sexual Activity  . Alcohol use: No  . Drug use: No  . Sexual activity: Not on file  Lifestyle  . Physical activity    Days per week: Not on file    Minutes per session: Not on file  . Stress:  Not on file  Relationships  . Social connections    Talks on phone: Not on file    Gets together: Not on file    Attends religious service: Not on file    Active member of club or organization: Not on file    Attends meetings of clubs or organizations: Not on file    Relationship status: Not on file  . Intimate partner violence    Fear of current or ex partner: Not on file    Emotionally abused: Not on file    Physically abused: Not on file    Forced sexual activity: Not on file  Other Topics Concern  . Not on file  Social History Narrative  . Not on file    Physical Exam Pulmonary:     Effort: No respiratory distress.     Breath sounds: Wheezing present. No rales.     Comments: Expiratory wheezing all lung fields Abdominal:     General: There is no distension.  Musculoskeletal:        General: No swelling.     Right lower leg: No edema.     Left lower leg: No edema.  Skin:    General: Skin is warm and dry.         Future Appointments  Date Time Provider Department Center  01/10/2019 10:35 AM [facility]  01/16/2019 10:40 AM McLean, Dalton S, MD [facility] None     BP 100/60 (BP Location: Left Arm, Patient Position: Standing, Cuff Size: Normal)   Pulse 76   Temp 98.1 F (36.7 C)   Wt 137 lb 9.6 oz (62.4 kg)   SpO2 97%   BMI 19.74 kg/m   Weight yesterday-didn't weigh Last visit weight-138  ATF pt CAO x4 standing inside of the house with no complaints.  He stated that he took his medications this morning and afternoon, including albuterol inhaler. He denies sob, chest pain and dizziness.  Pt also stated that he hasn't been around anyone sick as far as he knows.  He denies adding additional pillows (blankets) to sleep at night.  No increase in sob while walking, he's still using the [place] to get around.  Last week a worker from the servant center took him on a job interview.  He said that she will also work on finding him his own  apartment.  rx bottles verified and pill box refilled.    Medication ordered: Spirolactone   , EMT Paramedic [phone] 12/19/2018    ACTION: Home visit completed      

## 2018-12-26 ENCOUNTER — Other Ambulatory Visit (HOSPITAL_COMMUNITY): Payer: Self-pay

## 2018-12-26 NOTE — Progress Notes (Signed)
Paramedicine Encounter    Patient ID: BRINSON TOZZI, male    DOB: [birth year], 68 y.o.   MRN: [id number]    Patient Care Team: [name], MD as PCP - General (Internal Medicine) [name], MD as PCP - Advanced Heart Failure (Cardiology) [name], LCSW as Social Worker (Licensed Clinical Social Worker)  Patient Active Problem List   Diagnosis Date Noted  . Stage 3 severe COPD by GOLD classification ([place]) 11/14/2018  . Noncompliance 04/23/2017  . Mitral regurgitation 04/23/2017  . Thrombus - possible apical thrombus 01/2017 04/23/2017  . Hyperlipidemia 04/23/2017  . Pulmonary hypertension ([place])   . SOB (shortness of breath)   . Palliative care by specialist   . Tobacco abuse 04/19/2017  . Acute respiratory failure with hypoxia ([place]) 01/27/2017  . Hypertensive urgency 01/27/2017  . CHF (congestive heart failure) ([place]) 01/26/2017  . CKD (chronic kidney disease), stage III ([place]) 01/04/2017  . Essential hypertension 01/04/2017  . CVA (cerebral infarction) 12/12/2013  . Hyponatremia 12/06/2012  . Hiatal hernia 12/06/2012  . Syncope 12/06/2012  . Schizophrenia ([place])     Current Outpatient Medications:  .  aspirin 81 MG EC tablet, TAKE 1 TABLET BY MOUTH EVERY DAY, Disp: 30 tablet, Rfl: 1 .  atorvastatin (LIPITOR) 40 MG tablet, TAKE ONE TABLET BY MOUTH EVERY DAY IN THE EVENING, Disp: 30 tablet, Rfl: 6 .  furosemide (LASIX) 40 MG tablet, TAKE 1 TABLET (40 MG TOTAL) BY MOUTH DAILY. IF WEIGHT GOES UP 2 TO 3 LBS IN A DAY OR 3 TO 4 LBS IN A WEEK, INCREASE TO 2 TABS IN AM AND 1 TAB IN THE PM, Disp: 60 tablet, Rfl: 6 .  haloperidol decanoate (HALDOL DECANOATE) 100 MG/ML injection, INJECT 1 ML EVERY 4 WEEKS, Disp: , Rfl:  .  isosorbide-hydrALAZINE (BIDIL) 20-37.5 MG tablet, TAKE 2 TABLETS BY MOUTH 3 (THREE) TIMES DAILY.(AM, NOON, BEDTIME), Disp: 180 tablet, Rfl: 6 .  losartan (COZAAR) 25 MG tablet, Take 1 tablet (25 mg total) by mouth 2 (two) times daily., Disp: 180 tablet, Rfl:  3 .  potassium chloride SA (K-DUR,KLOR-CON) 20 MEQ tablet, TAKE ONE TABLET BY MOUTH ONCE DAILY (AM), Disp: 30 tablet, Rfl: 6 .  SPIRIVA RESPIMAT 2.5 MCG/ACT AERS, INHALE 1 SPRAY INTO THE LUNGS DAILY., Disp: 4 g, Rfl: 5 .  spironolactone (ALDACTONE) 25 MG tablet, TAKE ONE TABLET BY MOUTH ONCE DAILY (BEDTIME), Disp: 30 tablet, Rfl: 5 .  albuterol (PROVENTIL HFA;VENTOLIN HFA) 108 (90 Base) MCG/ACT inhaler, Inhale 1-2 puffs into the lungs every 6 (six) hours as needed for wheezing or shortness of breath., Disp: , Rfl:  .  bisoprolol (ZEBETA) 10 MG tablet, TAKE 1 TABLET (10 MG TOTAL) BY MOUTH DAILY., Disp: 90 tablet, Rfl: 0 .  clopidogrel (PLAVIX) 75 MG tablet, Take 75 mg by mouth daily., Disp: , Rfl:  .  Nicotine 21-14-7 MG/24HR KIT, Place 1 patch onto the skin daily., Disp: 56 each, Rfl: 1  Current Facility-Administered Medications:  .  methylPREDNISolone acetate (DEPO-MEDROL) injection 40 mg, 40 mg, Intra-articular, Once, [name], [name], MD Allergies  Allergen Reactions  . Sulfa Antibiotics Nausea Only     Social History   Socioeconomic History  . Marital status: Single    Spouse name: Not on file  . Number of children: Not on file  . Years of education: Not on file  . Highest education level: Not on file  Occupational History  . Not on file  Social Needs  . Financial resource strain: Not on  file  . Food insecurity    Worry: Not on file    Inability: Not on file  . Transportation needs    Medical: Not on file    Non-medical: Not on file  Tobacco Use  . Smoking status: Current Every Day Smoker    Packs/day: 1.50    Years: 44.00    Pack years: 66.00    Types: Cigarettes    Start date: 30  . Smokeless tobacco: Never Used  . Tobacco comment: 1ppd as of 12/06/17 ep  Substance and Sexual Activity  . Alcohol use: No  . Drug use: No  . Sexual activity: Not on file  Lifestyle  . Physical activity    Days per week: Not on file    Minutes per session: Not on file  . Stress: Not  on file  Relationships  . Social Herbalist on phone: Not on file    Gets together: Not on file    Attends religious service: Not on file    Active member of club or organization: Not on file    Attends meetings of clubs or organizations: Not on file    Relationship status: Not on file  . Intimate partner violence    Fear of current or ex partner: Not on file    Emotionally abused: Not on file    Physically abused: Not on file    Forced sexual activity: Not on file  Other Topics Concern  . Not on file  Social History Narrative  . Not on file    Physical Exam Pulmonary:     Effort: Pulmonary effort is normal.     Breath sounds: Wheezing present. No rales.  Abdominal:     General: There is no distension.  Musculoskeletal:     Right lower leg: No edema.     Left lower leg: No edema.  Skin:    General: Skin is warm and dry.         Future Appointments  Date Time Provider [place]  01/10/2019 10:35 AM [facility]  01/16/2019 10:40 AM [name], MD [facility] None     BP 98/68 (BP Location: Right Arm, Patient Position: Sitting, Cuff Size: Normal)   Pulse 77   Resp 20   Wt 133 lb 11.2 oz (60.6 kg)   SpO2 98%   BMI 19.18 kg/m   Weight yesterday-didn't weigh Last visit weight-137  ATF pt CAO x4 laying back on the porch talking with his roommate. He stated that he just took his afternoon medications. He denies sob, chest pain and dizziness. He hasn't needed to increase pillows or blankets to sleep. Pt has taken all of his for the week. He's out of spirolactone, I will pick it up from the pharmacy and bring it back later today.  Rx bottles verified and pill box refilled.  Wheezing noted to all four quadrants of his lungs, he stated that he hadn't used his inhaler yet.  I told him to do so.  Mr. Veldhuizen is concerned about his weight loss. He asked what should he do to gain weight. I told him that I will f/u with his  physicians and get back with him.   Medication ordered: Bidil  Leslieann Whisman, EMT Paramedic [phone] 12/26/2018    ACTION: Home visit completed

## 2019-01-02 ENCOUNTER — Other Ambulatory Visit (HOSPITAL_COMMUNITY): Payer: Self-pay

## 2019-01-02 ENCOUNTER — Telehealth (HOSPITAL_COMMUNITY): Payer: Self-pay | Admitting: *Deleted

## 2019-01-02 NOTE — Telephone Encounter (Signed)
Dee called to advise pt's BP is low today at 82/60, not orthostatic and pt is asymptomatic.  She states pt has been taking meds correctly and took them at 8 am.  Discussed w/Dr Aundra Dubin, he advised pt should hold Bidil, Losartan and Lasix the rest of today and recheck BP tomorrow.  Karena Addison is aware and will adjust pill box, she will see him tomorrow and let us know what BP is at that time.

## 2019-01-02 NOTE — Progress Notes (Signed)
Paramedicine Encounter    Patient ID: Gerald Nelson, male    DOB: Jan 06, 1951, 68 y.o.   MRN: [id number]    Patient Care Team: [name], MD as PCP - General (Internal Medicine) [name], MD as PCP - Advanced Heart Failure (Cardiology) [name], LCSW as Social Worker (Licensed Clinical Social Worker)  Patient Active Problem List   Diagnosis Date Noted  . Stage 3 severe COPD by GOLD classification ([place]) 11/14/2018  . Noncompliance 04/23/2017  . Mitral regurgitation 04/23/2017  . Thrombus - possible apical thrombus 01/2017 04/23/2017  . Hyperlipidemia 04/23/2017  . Pulmonary hypertension ([place])   . SOB (shortness of breath)   . Palliative care by specialist   . Tobacco abuse 04/19/2017  . Acute respiratory failure with hypoxia ([place]) 01/27/2017  . Hypertensive urgency 01/27/2017  . CHF (congestive heart failure) ([place]) 01/26/2017  . CKD (chronic kidney disease), stage III ([place]) 01/04/2017  . Essential hypertension 01/04/2017  . CVA (cerebral infarction) 12/12/2013  . Hyponatremia 12/06/2012  . Hiatal hernia 12/06/2012  . Syncope 12/06/2012  . Schizophrenia ([place])     Current Outpatient Medications:  .  aspirin 81 MG EC tablet, TAKE 1 TABLET BY MOUTH EVERY DAY, Disp: 30 tablet, Rfl: 1 .  atorvastatin (LIPITOR) 40 MG tablet, TAKE ONE TABLET BY MOUTH EVERY DAY IN THE EVENING, Disp: 30 tablet, Rfl: 6 .  bisoprolol (ZEBETA) 10 MG tablet, TAKE 1 TABLET (10 MG TOTAL) BY MOUTH DAILY., Disp: 90 tablet, Rfl: 0 .  furosemide (LASIX) 40 MG tablet, TAKE 1 TABLET (40 MG TOTAL) BY MOUTH DAILY. IF WEIGHT GOES UP 2 TO 3 LBS IN A DAY OR 3 TO 4 LBS IN A WEEK, INCREASE TO 2 TABS IN AM AND 1 TAB IN THE PM, Disp: 60 tablet, Rfl: 6 .  haloperidol decanoate (HALDOL DECANOATE) 100 MG/ML injection, INJECT 1 ML EVERY 4 WEEKS, Disp: , Rfl:  .  isosorbide-hydrALAZINE (BIDIL) 20-37.5 MG tablet, TAKE 2 TABLETS BY MOUTH 3 (THREE) TIMES DAILY.(AM, NOON, BEDTIME), Disp: 180 tablet, Rfl: 6 .  losartan  (COZAAR) 25 MG tablet, Take 1 tablet (25 mg total) by mouth 2 (two) times daily., Disp: 180 tablet, Rfl: 3 .  potassium chloride SA (K-DUR,KLOR-CON) 20 MEQ tablet, TAKE ONE TABLET BY MOUTH ONCE DAILY (AM), Disp: 30 tablet, Rfl: 6 .  SPIRIVA RESPIMAT 2.5 MCG/ACT AERS, INHALE 1 SPRAY INTO THE LUNGS DAILY., Disp: 4 g, Rfl: 5 .  spironolactone (ALDACTONE) 25 MG tablet, TAKE ONE TABLET BY MOUTH ONCE DAILY (BEDTIME), Disp: 30 tablet, Rfl: 5 .  albuterol (PROVENTIL HFA;VENTOLIN HFA) 108 (90 Base) MCG/ACT inhaler, Inhale 1-2 puffs into the lungs every 6 (six) hours as needed for wheezing or shortness of breath., Disp: , Rfl:  .  clopidogrel (PLAVIX) 75 MG tablet, Take 75 mg by mouth daily., Disp: , Rfl:  .  Nicotine 21-14-7 MG/24HR KIT, Place 1 patch onto the skin daily., Disp: 56 each, Rfl: 1  Current Facility-Administered Medications:  .  methylPREDNISolone acetate (DEPO-MEDROL) injection 40 mg, 40 mg, Intra-articular, Once, [name], [name], MD Allergies  Allergen Reactions  . Sulfa Antibiotics Nausea Only     Social History   Socioeconomic History  . Marital status: Single    Spouse name: Not on file  . Number of children: Not on file  . Years of education: Not on file  . Highest education level: Not on file  Occupational History  . Not on file  Social Needs  . Financial resource strain: Not on  file  . Food insecurity    Worry: Not on file    Inability: Not on file  . Transportation needs    Medical: Not on file    Non-medical: Not on file  Tobacco Use  . Smoking status: Current Every Day Smoker    Packs/day: 1.50    Years: 44.00    Pack years: 66.00    Types: Cigarettes    Start date: 63  . Smokeless tobacco: Never Used  . Tobacco comment: 1ppd as of 12/06/17 ep  Substance and Sexual Activity  . Alcohol use: No  . Drug use: No  . Sexual activity: Not on file  Lifestyle  . Physical activity    Days per week: Not on file    Minutes per session: Not on file  . Stress: Not  on file  Relationships  . Social Herbalist on phone: Not on file    Gets together: Not on file    Attends religious service: Not on file    Active member of club or organization: Not on file    Attends meetings of clubs or organizations: Not on file    Relationship status: Not on file  . Intimate partner violence    Fear of current or ex partner: Not on file    Emotionally abused: Not on file    Physically abused: Not on file    Forced sexual activity: Not on file  Other Topics Concern  . Not on file  Social History Narrative  . Not on file    Physical Exam Pulmonary:     Effort: Pulmonary effort is normal.     Breath sounds: No wheezing or rales.  Musculoskeletal:        General: No swelling.     Right lower leg: No edema.     Left lower leg: No edema.  Skin:    General: Skin is warm and dry.         Future Appointments  Date Time Provider [place]  01/10/2019 10:35 AM [facility]  01/16/2019 10:40 AM [name], MD [facility] None     BP (!) 82/60   Pulse 74   Temp 98.4 F (36.9 C)   Wt 130 lb (59 kg)   SpO2 98%   BMI 18.65 kg/m   Weight yesterday-didn't weigh Last visit weight-133  ATF pt CAO x4 sitting on the porch. He states that he's still having issues with his appetite, he's getting full faster or he becomes uninterested in completing his plate.  He has several meals left from "mom meals", Eliezer Lofts verified that last week was the delivery; 3 months over on 12/28/18.  Pt denies sob, chest pain and dizziness.  He's taken all of his medications for this week. His BP is low today/no ortho static changes or dizziness when he stood up.  I [facility] with [facility] about the same.   Dr. Aundra Dubin said to withhold losartatin and Bidil for the rest of today and tomorrow morning; no lasix tomorrow morning. F/u visit tomorrow @  10.    Medication ordered: ([facility] pharmacist said that he's  waiting for the orders from his physicians)   Clopidogrel [place], EMT Paramedic [phone] 01/02/2019    ACTION: Home visit completed

## 2019-01-03 ENCOUNTER — Telehealth (HOSPITAL_COMMUNITY): Payer: Self-pay | Admitting: Licensed Clinical Social Worker

## 2019-01-03 ENCOUNTER — Other Ambulatory Visit (HOSPITAL_COMMUNITY): Payer: Self-pay

## 2019-01-03 NOTE — Telephone Encounter (Signed)
Pt enrolled in [employer]oms Meals 3 month study through [employer] [facility]- patient's 3 month period has now ended so CSW spoke with pt to complete an exit interview regarding patient's experience:  1. Since being on Moms Meals have you noticed any changes in your health or the way you feel physically?  Has very low appetite- reports not wanting to eat that much and when he did only ate the Moms meals- states this was a problem prior to starting the program however. Weight loss? Lost 7-8lbs over course of the program. Improvement in BP? States his BP has gotten lower- was in the 120s but now in the high 90s.  2. What do you like about the program? Liked how the meals tasted and loved how convenient it was. What would you like to change/add? N/A  3. How did you get food prior to [place]? a. Did you get food stamps? $70/month b. Did someone help financially? no c. Did you access food banks? Has in the past but not recently. d. [place] [employer] you anticipate any issues transitioning back to getting your own food after this program ends? no  4. Would you be interested in future food pilots? yes  5. Would you like a dietary consult? yes  6. Do you have internet access? Tablet/Smart Phone? no   [name], LCSW Clinical Social Worker [facility] Desk#: [phone] Cell#: [phone]

## 2019-01-03 NOTE — Progress Notes (Signed)
This is a follow up visit to check his blood pressure after medication alteration yesterday.  Pt denies dizziness, light headedness and chest pain this morning.  He has taken the morning meds.  Pt's bp noted @ 98/60.

## 2019-01-09 ENCOUNTER — Other Ambulatory Visit (HOSPITAL_COMMUNITY): Payer: Self-pay

## 2019-01-09 NOTE — Progress Notes (Signed)
Paramedicine Encounter    Patient ID: Gerald Nelson, male    DOB: 06/17/1951, 68 y.o.   MRN: [id number]    Patient Care Team: Husain, Karrar, MD as PCP - General (Internal Medicine) McLean, Dalton S, MD as PCP - Advanced Heart Failure (Cardiology) Uris, Jenna H, LCSW as Social Worker (Licensed Clinical Social Worker)  Patient Active Problem List   Diagnosis Date Noted  . Stage 3 severe COPD by GOLD classification (HCC) 11/14/2018  . Noncompliance 04/23/2017  . Mitral regurgitation 04/23/2017  . Thrombus - possible apical thrombus 01/2017 04/23/2017  . Hyperlipidemia 04/23/2017  . Pulmonary hypertension (HCC)   . SOB (shortness of breath)   . Palliative care by specialist   . Tobacco abuse 04/19/2017  . Acute respiratory failure with hypoxia (HCC) 01/27/2017  . Hypertensive urgency 01/27/2017  . CHF (congestive heart failure) (HCC) 01/26/2017  . CKD (chronic kidney disease), stage III (HCC) 01/04/2017  . Essential hypertension 01/04/2017  . CVA (cerebral infarction) 12/12/2013  . Hyponatremia 12/06/2012  . Hiatal hernia 12/06/2012  . Syncope 12/06/2012  . Schizophrenia (HCC)     Current Outpatient Medications:  .  albuterol (PROVENTIL HFA;VENTOLIN HFA) 108 (90 Base) MCG/ACT inhaler, Inhale 1-2 puffs into the lungs every 6 (six) hours as needed for wheezing or shortness of breath., Disp: , Rfl:  .  aspirin 81 MG EC tablet, TAKE 1 TABLET BY MOUTH EVERY DAY, Disp: 30 tablet, Rfl: 1 .  atorvastatin (LIPITOR) 40 MG tablet, TAKE ONE TABLET BY MOUTH EVERY DAY IN THE EVENING, Disp: 30 tablet, Rfl: 6 .  bisoprolol (ZEBETA) 10 MG tablet, TAKE 1 TABLET (10 MG TOTAL) BY MOUTH DAILY., Disp: 90 tablet, Rfl: 0 .  clopidogrel (PLAVIX) 75 MG tablet, Take 75 mg by mouth daily., Disp: , Rfl:  .  furosemide (LASIX) 40 MG tablet, TAKE 1 TABLET (40 MG TOTAL) BY MOUTH DAILY. IF WEIGHT GOES UP 2 TO 3 LBS IN A DAY OR 3 TO 4 LBS IN A WEEK, INCREASE TO 2 TABS IN AM AND 1 TAB IN THE PM, Disp: 60 tablet,  Rfl: 6 .  haloperidol decanoate (HALDOL DECANOATE) 100 MG/ML injection, INJECT 1 ML EVERY 4 WEEKS, Disp: , Rfl:  .  isosorbide-hydrALAZINE (BIDIL) 20-37.5 MG tablet, TAKE 2 TABLETS BY MOUTH 3 (THREE) TIMES DAILY.(AM, NOON, BEDTIME), Disp: 180 tablet, Rfl: 6 .  losartan (COZAAR) 25 MG tablet, Take 1 tablet (25 mg total) by mouth 2 (two) times daily., Disp: 180 tablet, Rfl: 3 .  Nicotine 21-14-7 MG/24HR KIT, Place 1 patch onto the skin daily., Disp: 56 each, Rfl: 1 .  potassium chloride SA (K-DUR,KLOR-CON) 20 MEQ tablet, TAKE ONE TABLET BY MOUTH ONCE DAILY (AM), Disp: 30 tablet, Rfl: 6 .  SPIRIVA RESPIMAT 2.5 MCG/ACT AERS, INHALE 1 SPRAY INTO THE LUNGS DAILY., Disp: 4 g, Rfl: 5 .  spironolactone (ALDACTONE) 25 MG tablet, TAKE ONE TABLET BY MOUTH ONCE DAILY (BEDTIME), Disp: 30 tablet, Rfl: 5  Current Facility-Administered Medications:  .  methylPREDNISolone acetate (DEPO-MEDROL) injection 40 mg, 40 mg, Intra-articular, Once, [name], [name], MD Allergies  Allergen Reactions  . Sulfa Antibiotics Nausea Only     Social History   Socioeconomic History  . Marital status: Single    Spouse name: Not on file  . Number of children: Not on file  . Years of education: Not on file  . Highest education level: Not on file  Occupational History  . Not on file  Social Needs  . Financial resource strain: Not on   file  . Food insecurity    Worry: Not on file    Inability: Not on file  . Transportation needs    Medical: Not on file    Non-medical: Not on file  Tobacco Use  . Smoking status: Current Every Day Smoker    Packs/day: 1.50    Years: 44.00    Pack years: 66.00    Types: Cigarettes    Start date: 49  . Smokeless tobacco: Never Used  . Tobacco comment: 1ppd as of 12/06/17 ep  Substance and Sexual Activity  . Alcohol use: No  . Drug use: No  . Sexual activity: Not on file  Lifestyle  . Physical activity    Days per week: Not on file    Minutes per session: Not on file  . Stress:  Not on file  Relationships  . Social Herbalist on phone: Not on file    Gets together: Not on file    Attends religious service: Not on file    Active member of club or organization: Not on file    Attends meetings of clubs or organizations: Not on file    Relationship status: Not on file  . Intimate partner violence    Fear of current or ex partner: Not on file    Emotionally abused: Not on file    Physically abused: Not on file    Forced sexual activity: Not on file  Other Topics Concern  . Not on file  Social History Narrative  . Not on file    Physical Exam Pulmonary:     Effort: No respiratory distress.     Breath sounds: Wheezing present. No rales.  Musculoskeletal:     Right lower leg: No edema.     Left lower leg: No edema.  Skin:    General: Skin is warm and dry.         Future Appointments  Date Time Provider [place]  01/15/2019  2:05 PM [facility] ECHO 5 [id number]  01/16/2019 10:40 AM [name], MD [facility] None     BP 100/70 (BP Location: Left Arm, Patient Position: Sitting, Cuff Size: Normal)   Pulse 74   Temp 98.3 F (36.8 C)   Resp 15   Wt 135 lb (61.2 kg)   SpO2 98%   BMI 19.37 kg/m   Weight yesterday-135 Last visit weight-135  ATF pt CAO x4 sitting on the porch sleeping. He stated that he feels fine and has no complaints. He's taken all of his medications for the past week.  He denies adding extra pillows to sleep. His appetite is still the same. Although, mom meals has stopped he said that he still have food to eat.  He got the haldol shot yesterday; no medication change by his psychiatrist during that visit. rx bottles verified and pill box refilled.    Medication ordered: Potassium  Dallan Schonberg, EMT Paramedic [phone] 01/09/2019    ACTION: Home visit completed

## 2019-01-10 ENCOUNTER — Other Ambulatory Visit (HOSPITAL_COMMUNITY): Payer: Medicare Other

## 2019-01-15 ENCOUNTER — Other Ambulatory Visit: Payer: Self-pay

## 2019-01-15 ENCOUNTER — Ambulatory Visit (HOSPITAL_COMMUNITY): Payer: Medicare Other | Attending: Internal Medicine

## 2019-01-15 DIAGNOSIS — I5022 Chronic systolic (congestive) heart failure: Secondary | ICD-10-CM | POA: Diagnosis present

## 2019-01-16 ENCOUNTER — Ambulatory Visit (HOSPITAL_COMMUNITY)
Admission: RE | Admit: 2019-01-16 | Discharge: 2019-01-16 | Disposition: A | Discharge status: Home / Self Care | Payer: Medicare Other | Source: Ambulatory Visit | Attending: Cardiology | Admitting: Cardiology

## 2019-01-16 ENCOUNTER — Other Ambulatory Visit (HOSPITAL_COMMUNITY): Payer: Self-pay

## 2019-01-16 ENCOUNTER — Telehealth (HOSPITAL_COMMUNITY): Payer: Self-pay

## 2019-01-16 DIAGNOSIS — I5022 Chronic systolic (congestive) heart failure: Secondary | ICD-10-CM | POA: Diagnosis not present

## 2019-01-16 MED ORDER — BIDIL 20-37.5 MG PO TABS: 1.0000 | ORAL_TABLET | Freq: Three times a day (TID) | ORAL | 6 refills | Status: DC

## 2019-01-16 NOTE — Progress Notes (Signed)
Heart Failure TeleHealth Note  Due to national recommendations of social distancing due to [place] 19, Audio/video telehealth visit is felt to be most appropriate for this patient at this time.  See MyChart message from today for patient consent regarding telehealth for [facility].  Date:  01/16/2019   ID:  Gerald Nelson, DOB January 28, 1951, MRN [id number]  Location: Home  Provider location: [place] [facility] Type of Visit: Established patient   PCP:  Gerald Low, MD  Cardiologist:  Dr. Aundra Nelson  Chief Complaint: Shortness of breath   History of Present Illness: Gerald Nelson is a 68 y.o. male who presents via audio/video conferencing for a telehealth visit today.   We were unable to connect to a videoconference so had to use audio only.   he denies symptoms worrisome for COVID 19.   Patient has a past medical history of chronic systolic CHF (EF 37% in July 2018), severe MR/TR, pulmonary HTN, CVA, CKD, schizophrenia, HTN and tobacco abuse.   Gerald Nelson was admitted to [facility] 7/2-01/06/17 for newly diagnosed acute systolic CHF. Echo showed LVEF 15%, severe global hypokinesis, moderate LVH, coarsetrabeculation of the LV apex with numerous false tendinae of the left ventricle, very stagnant blood flow at the LV apex withsmoke but no obvious LV thrombus - Definity contrast was given,again, noting stagnant apical blood flow - this could suggestrecent thrombus and certainly high risk for apical thrombusformation. Other findings include aortic sclerosis with mild AI,moderate to severe MR, moderate LAE, mild RAE, moderate to severeTR, moderate to severe pulmonary hypertension (RVSP 73 mmHg), dilated IVC, trivial posterior pericardial effusion.   He was admitted again in 10/18. He diuresed 25 pounds on IV lasix. He refused cath. His clonidine and diltazem were stopped during this admission. Referred to paramedicine. Discharge weight 133 pounds.   Echo 08/11/17 LVEF 15-20%,  no MR noted.   Echo in 7/20 showed improvement in EF to 50-55%, moderate LVH, normal RV size and systolic function.   He is still smoking, trying to quit with nicotine patch.  He can walk 3 blocks without dyspnea.  No chest pain.  No orthopnea/PND.  His BP has been running Nelson recently with mild orthostatic symptoms.  BP 94/60 today.     Labs (6/19): K 3.8, creatinine 2.2 Labs (11/19): K 4.1, creatinine 1.95, LDL 54, HDl 54 Labs (12/19): K 4.6, creatinine 1.89 LabS (2/20): K 4.5, creatinine 1.68  Labs (4/20): K 4.5, creatinine 1.58  PMH: 1. Chronic systolic CHF:  Cardiomyopathy of uncertain etiology, refused cath. He also refused ICD.  - Echo (7/18): Moderate LVH, severe FBSH, trabeculation at apex not meeting criteria for noncompaction, EF 15%, moderate to severe MR, Nelson normal RV systolic function, moderate-severe TR.  - Echo (2/19): Moderate LVH, EF 15-20%, No mitral regurgitation, normal RV size and systolic function, mild TR.  - Echo (7/20): EF 50-55%, moderate LVH, normal RV size and systolic function.  2. H/o CVA 3. Schizophrenia 4. CKD stage 3 5. Mitral regurgitation: Suspect functional. Moderate-severe MR on 7/18 echo, minimal MR on 2/19 echo.  6. COPD: He is an active smoker.  - PFTs (12/18) suggestive of severe COPD.  7. H/o HTN 8. ? apical mural thrombus: Not seen on most recent echo in 2/19.  9. CAD: 1/19 CT chest showed coronary calcification.   Current Outpatient Medications  Medication Sig Dispense Refill  . albuterol (PROVENTIL HFA;VENTOLIN HFA) 108 (90 Base) MCG/ACT inhaler Inhale 1-2 puffs into the lungs every 6 (six)  hours as needed for wheezing or shortness of breath.    Marland Kitchen aspirin 81 MG EC tablet TAKE 1 TABLET BY MOUTH EVERY DAY 30 tablet 1  . atorvastatin (LIPITOR) 40 MG tablet TAKE ONE TABLET BY MOUTH EVERY DAY IN THE EVENING 30 tablet 6  . bisoprolol (ZEBETA) 10 MG tablet TAKE 1 TABLET (10 MG TOTAL) BY MOUTH DAILY. 90 tablet 0  . clopidogrel (PLAVIX) 75 MG  tablet Take 75 mg by mouth daily.    . furosemide (LASIX) 40 MG tablet TAKE 1 TABLET (40 MG TOTAL) BY MOUTH DAILY. IF WEIGHT GOES UP 2 TO 3 LBS IN A DAY OR 3 TO 4 LBS IN A WEEK, INCREASE TO 2 TABS IN AM AND 1 TAB IN THE PM 60 tablet 6  . haloperidol decanoate (HALDOL DECANOATE) 100 MG/ML injection INJECT 1 ML EVERY 4 WEEKS    . isosorbide-hydrALAZINE (BIDIL) 20-37.5 MG tablet Take 1 tablet by mouth 3 (three) times daily. 180 tablet 6  . losartan (COZAAR) 25 MG tablet Take 1 tablet (25 mg total) by mouth 2 (two) times daily. 180 tablet 3  . Nicotine 21-14-7 MG/24HR KIT Place 1 patch onto the skin daily. 56 each 1  . potassium chloride SA (K-DUR,KLOR-CON) 20 MEQ tablet TAKE ONE TABLET BY MOUTH ONCE DAILY (AM) 30 tablet 6  . SPIRIVA RESPIMAT 2.5 MCG/ACT AERS INHALE 1 SPRAY INTO THE LUNGS DAILY. 4 g 5  . spironolactone (ALDACTONE) 25 MG tablet TAKE ONE TABLET BY MOUTH ONCE DAILY (BEDTIME) 30 tablet 5   Current Facility-Administered Medications  Medication Dose Route Frequency Provider Last Rate Last Dose  . methylPREDNISolone acetate (DEPO-MEDROL) injection 40 mg  40 mg Intra-articular Once Gerald Nelson, [name], MD        Allergies:   Sulfa antibiotics   Social History:  The patient  reports that he has been smoking cigarettes. He started smoking about 46 years ago. He has a 66.00 pack-year smoking history. He has never used smokeless tobacco. He reports that he does not drink alcohol or use drugs.   Family History:  The patient's family history includes Hypertension in his father and mother.   ROS:  Please see the history of present illness.   All other systems are personally reviewed and negative.   Exam:  (Video/Tele Health Call; Exam is subjective and or/visual.) BP 94/60, HR 70 General: Speaks in full sentences, no resp difficulty.  Neck: No JVD Lungs: Normal respiratory effort with conversation.  Abdomen: nondistended, no tenderness with self-palpation.  Extremities: No edema.  Neuro:  Alert/oriented x 3.   Recent Labs: 05/09/2018: ALT 22 10/30/2018: BUN 21; Creatinine, Ser 1.58; Potassium 4.5; Sodium 137  Personally reviewed   Wt Readings from Last 3 Encounters:  01/16/19 60.1 kg (132 lb 9.6 oz)  01/09/19 61.2 kg (135 lb)  01/03/19 61.5 kg (135 lb 9.6 oz)      ASSESSMENT AND PLAN:  1. Chronic systolic CHF: Echo 0/0867 EF 15% with moderate-severe MR. Echo 08/11/17 LVEF 15-20%, no MR noted.  Cardiomyopathy of uncertain etiology, he has refused cath in the past.  Echo in 7/20 showed improvement in EF to 50-55%.  NYHA class II symptoms.  Weight and symptoms stable.  Creatinine 1.58 last check (stable).  BP running Nelson with orthostatic symptoms.  - He has been reluctant to have heart cath, will hold off for now with improvement in EF.  - Continue Lasix 40 mg daily.  - Continue bisoprolol 10 mg daily.   - Continue losartan 25  mg bid.  - Continue spironolactone 25 mg daily.  - I will arrange for a BMET.  - With lower BP and orthostatic symptoms, decrease Bidil to 1 tab tid.  - He is out of ICD range now, refused ICD in past.   2. Questionable apical thrombus: Not seen on 7/20 echo. He is not anticoagulated.  3. Mitral regurgitation: Moderate to severe on 7/18 echo but trivial on 7/20 echo.  Suspect functional MR, improved with improved LV function.  4. COPD: He continues to smoke.  Severe COPD by prior PFTs.  - I again encouraged him to quit, he has started nicotine patches.  5. CKD stage III: I will arrange for a BMET.   6. Schizophrenia: Affects compliance with medical care.  Continue paramedicine, this seems to be working well.  7. CAD: Noted on CT chest.  Concern for ischemic cardiomyopathy as above.  - Continue ASA 81.  - Continue atorvastatin, good LDL in 11/19.  - As above, recommended cath in past for diagnosis of CAD but he refused.  Think reasonable to hold off now with improved EF.   COVID screen The patient does not have any symptoms that suggest any  further testing/ screening at this time.  Social distancing reinforced today.  Relevant cardiac medications were reviewed at length with the patient today.   The patient does not have concerns regarding their medications at this time.   Recommended follow-up:  3 months   Today, I have spent 17 minutes with the patient with telehealth technology discussing the above issues .    Signed, Loralie Champagne, MD  01/16/2019  [facility] [address] [facility] [place] [address] [phone] (office) [phone] (fax)

## 2019-01-16 NOTE — Patient Instructions (Addendum)
Use nicotine patches to quit smoking.    DECREASE Bidil to 1 tab three times a day.    Labs  01/24/19 2pm We will only contact you if something comes back abnormal or we need to make some changes. Otherwise no news is good news!   Followup in 3 months in office. You will get a call to schedule this appointment.   At the [facility], you and your health needs are our priority. As part of our continuing mission to provide you with exceptional heart care, we have created designated Provider Care Teams. These Care Teams include your primary Cardiologist (physician) and Advanced Practice Providers (APPs- Physician Assistants and Nurse Practitioners) who all work together to provide you with the care you need, when you need it.   You may see any of the following providers on your designated Care Team at your next follow up: Marland Kitchen Dr Glori Bickers . Dr Loralie Champagne . Darrick Grinder, NP

## 2019-01-16 NOTE — Progress Notes (Signed)
Paramedicine Encounter   Patient ID: Gerald Nelson , male,   DOB: February 08, 1951,68 y.o.,  MRN: [id number]   Met patient at his home for a virtual visit with provider Dr. Aundra Dubin.  Pt sitting on the porch upon my arrival with no complaints. He took his meds today around 730.  He's BP is @ 94/60; his BP has been around the same for several weeks.  No ortho static changes; he denies dizziness, nausea and chest pain.  Gerald Nelson said that he hasn't increased the amount of blankets that he uses to sleep. There's been no change in sob while walking.  He walks daily to the bus stop and appointments.   Dr. Aundra Dubin advised to decrease the amount of bidil he's taking daily.  [name]'s EF has increase from 20/25% - 55%.  He discussed options for him to stop smoking.  No other medications change during this visit.  Pt should get a call back to schedule a visit for LAB work.   I left a message for [facility] @ Dr. Deforest Hoyles office about the prescription for plavix.  Christy Sartorius at [facility] said that the prescription on file was for 30 days only.    Time spent with patient 50 mins ,,,,,,,,,,,,,,,,,,,,,,,,,,,,,,,,,,,,,,,,,,,,,,,,,,,,,,,,,,,,,,,,,,,,,,,,,,,,,,,,,,,,,,,,,,,,,,,,,,,,,,,,,,,,,,,,,,,,,,,,,,,,,,,,,,-  [place], EMT-Paramedic [phone] 01/16/2019   ACTION: Home visit completed

## 2019-01-16 NOTE — Progress Notes (Signed)
Called patient, reviewed AVS.  Pt will RTC in 1 week for lab. Lab appointment given. AVS and reminder card mailed to patient. Verbalized understanding.

## 2019-01-16 NOTE — Telephone Encounter (Signed)
I called Gerald Nelson to make sure that he was aware of the virtual visit with [facility] this morning.  He said that he'd be home around 10:30; ill meet him there.

## 2019-01-24 ENCOUNTER — Other Ambulatory Visit: Payer: Self-pay

## 2019-01-24 ENCOUNTER — Ambulatory Visit (HOSPITAL_COMMUNITY)
Admission: RE | Admit: 2019-01-24 | Discharge: 2019-01-24 | Disposition: A | Discharge status: Home / Self Care | Payer: Medicare Other | Source: Ambulatory Visit | Attending: Internal Medicine | Admitting: Internal Medicine

## 2019-01-24 DIAGNOSIS — I5022 Chronic systolic (congestive) heart failure: Secondary | ICD-10-CM

## 2019-01-24 LAB — BASIC METABOLIC PANEL
Anion gap: 7 (ref 5–15)
BUN: 29 mg/dL — ABNORMAL HIGH (ref 8–23)
CO2: 26 mmol/L (ref 22–32)
Calcium: 9.6 mg/dL (ref 8.9–10.3)
Chloride: 103 mmol/L (ref 98–111)
Creatinine, Ser: 1.91 mg/dL — ABNORMAL HIGH (ref 0.61–1.24)
GFR calc Af Amer: 41 mL/min — ABNORMAL LOW (ref >60)
GFR calc non Af Amer: 36 mL/min — ABNORMAL LOW (ref >60)
Glucose, Bld: 108 mg/dL — ABNORMAL HIGH (ref 70–99)
Potassium: 4.3 mmol/L (ref 3.5–5.1)
Sodium: 136 mmol/L (ref 135–145)

## 2019-01-31 ENCOUNTER — Other Ambulatory Visit (HOSPITAL_COMMUNITY): Payer: Self-pay | Admitting: Cardiology

## 2019-01-31 ENCOUNTER — Other Ambulatory Visit (HOSPITAL_COMMUNITY): Payer: Self-pay

## 2019-01-31 NOTE — Progress Notes (Signed)
Paramedicine Encounter    Patient ID: Gerald Nelson, male    DOB: [birth year], 67 y.o.   MRN: [id number]   Patient Care Team: [name], MD as PCP - General (Internal Medicine) [name], MD as PCP - Advanced Heart Failure (Cardiology) [name], LCSW as Social Worker (Licensed Clinical Social Worker)  Patient Active Problem List   Diagnosis Date Noted  . Stage 3 severe COPD by GOLD classification ([place]) 11/14/2018  . Noncompliance 04/23/2017  . Mitral regurgitation 04/23/2017  . Thrombus - possible apical thrombus 01/2017 04/23/2017  . Hyperlipidemia 04/23/2017  . Pulmonary hypertension ([place])   . SOB (shortness of breath)   . Palliative care by specialist   . Tobacco abuse 04/19/2017  . Acute respiratory failure with hypoxia ([place]) 01/27/2017  . Hypertensive urgency 01/27/2017  . CHF (congestive heart failure) ([place]) 01/26/2017  . CKD (chronic kidney disease), stage III ([place]) 01/04/2017  . Essential hypertension 01/04/2017  . CVA (cerebral infarction) 12/12/2013  . Hyponatremia 12/06/2012  . Hiatal hernia 12/06/2012  . Syncope 12/06/2012  . Schizophrenia ([place])     Current Outpatient Medications:  .  albuterol (PROVENTIL HFA;VENTOLIN HFA) 108 (90 Base) MCG/ACT inhaler, Inhale 1-2 puffs into the lungs every 6 (six) hours as needed for wheezing or shortness of breath., Disp: , Rfl:  .  aspirin 81 MG EC tablet, TAKE 1 TABLET BY MOUTH EVERY DAY, Disp: 30 tablet, Rfl: 1 .  atorvastatin (LIPITOR) 40 MG tablet, TAKE ONE TABLET BY MOUTH EVERY DAY IN THE EVENING, Disp: 30 tablet, Rfl: 6 .  bisoprolol (ZEBETA) 10 MG tablet, TAKE 1 TABLET (10 MG TOTAL) BY MOUTH DAILY., Disp: 90 tablet, Rfl: 0 .  furosemide (LASIX) 40 MG tablet, TAKE 1 TABLET (40 MG TOTAL) BY MOUTH DAILY. IF WEIGHT GOES UP 2 TO 3 LBS IN A DAY OR 3 TO 4 LBS IN A WEEK, INCREASE TO 2 TABS IN AM AND 1 TAB IN THE PM, Disp: 60 tablet, Rfl: 6 .  haloperidol decanoate (HALDOL DECANOATE) 100 MG/ML injection, INJECT 1 ML  EVERY 4 WEEKS, Disp: , Rfl:  .  isosorbide-hydrALAZINE (BIDIL) 20-37.5 MG tablet, Take 1 tablet by mouth 3 (three) times daily., Disp: 180 tablet, Rfl: 6 .  losartan (COZAAR) 25 MG tablet, Take 1 tablet (25 mg total) by mouth 2 (two) times daily., Disp: 180 tablet, Rfl: 3 .  potassium chloride SA (K-DUR,KLOR-CON) 20 MEQ tablet, TAKE ONE TABLET BY MOUTH ONCE DAILY (AM), Disp: 30 tablet, Rfl: 6 .  SPIRIVA RESPIMAT 2.5 MCG/ACT AERS, INHALE 1 SPRAY INTO THE LUNGS DAILY., Disp: 4 g, Rfl: 5 .  clopidogrel (PLAVIX) 75 MG tablet, Take 75 mg by mouth daily., Disp: , Rfl:  .  Nicotine 21-14-7 MG/24HR KIT, Place 1 patch onto the skin daily. (Patient not taking: Reported on 01/31/2019), Disp: 56 each, Rfl: 1 .  spironolactone (ALDACTONE) 25 MG tablet, TAKE ONE TABLET BY MOUTH ONCE DAILY (BEDTIME) (Patient not taking: Reported on 01/31/2019), Disp: 30 tablet, Rfl: 5  Current Facility-Administered Medications:  .  methylPREDNISolone acetate (DEPO-MEDROL) injection 40 mg, 40 mg, Intra-articular, Once, [name], [name], MD Allergies  Allergen Reactions  . Sulfa Antibiotics Nausea Only      Social History   Socioeconomic History  . Marital status: Single    Spouse name: Not on file  . Number of children: Not on file  . Years of education: Not on file  . Highest education level: Not on file  Occupational History  . Not on file  Social Needs  . Financial resource strain: Not on file  . Food insecurity    Worry: Not on file    Inability: Not on file  . Transportation needs    Medical: Not on file    Non-medical: Not on file  Tobacco Use  . Smoking status: Current Every Day Smoker    Packs/day: 1.50    Years: 44.00    Pack years: 66.00    Types: Cigarettes    Start date: 20  . Smokeless tobacco: Never Used  . Tobacco comment: 1ppd as of 12/06/17 ep  Substance and Sexual Activity  . Alcohol use: No  . Drug use: No  . Sexual activity: Not on file  Lifestyle  . Physical activity    Days per  week: Not on file    Minutes per session: Not on file  . Stress: Not on file  Relationships  . Social Herbalist on phone: Not on file    Gets together: Not on file    Attends religious service: Not on file    Active member of club or organization: Not on file    Attends meetings of clubs or organizations: Not on file    Relationship status: Not on file  . Intimate partner violence    Fear of current or ex partner: Not on file    Emotionally abused: Not on file    Physically abused: Not on file    Forced sexual activity: Not on file  Other Topics Concern  . Not on file  Social History Narrative  . Not on file    Physical Exam      Future Appointments  Date Time Provider [place]  04/19/2019  1:40 PM [name], MD [facility] None    BP 104/70   Pulse 72   Temp 98.2 F (36.8 C)   Resp 16   Wt 133 lb (60.3 kg)   SpO2 98%   BMI 19.08 kg/m   Weight yesterday-? Last visit weight-135  Pt reports feeling ok, he denies increased sob. No c/p, no dizziness. Meds verified and pill box refilled.  Does not weigh daily.   Needs refills on bisoprolol, losartan,  Does not have the spiro.  He wants to pick up his meds on Monday.   He does not have the plavix-- last note per dee that dr Deforest Hoyles has not responded about his plavix.  Ordered the sprio and losartan The bisoprolol did not have refills so they will fax over for refill.   Pt said he will pick them up tomor.  zack will be able to go by tomor to place spiro in pill box.   Marylouise Stacks, [facility] Paramedic  01/31/19

## 2019-02-06 ENCOUNTER — Other Ambulatory Visit (HOSPITAL_COMMUNITY): Payer: Self-pay

## 2019-02-06 NOTE — Progress Notes (Signed)
Paramedicine Encounter    Patient ID: Gerald Nelson, male    DOB: 11-Jul-1950, 68 y.o.   MRN: [id number]    Patient Care Team: [name], MD as PCP - General (Internal Medicine) [name], MD as PCP - Advanced Heart Failure (Cardiology) [name], LCSW as Social Worker (Licensed Clinical Social Worker)  Patient Active Problem List   Diagnosis Date Noted  . Stage 3 severe COPD by GOLD classification ([place]) 11/14/2018  . Noncompliance 04/23/2017  . Mitral regurgitation 04/23/2017  . Thrombus - possible apical thrombus 01/2017 04/23/2017  . Hyperlipidemia 04/23/2017  . Pulmonary hypertension ([place])   . SOB (shortness of breath)   . Palliative care by specialist   . Tobacco abuse 04/19/2017  . Acute respiratory failure with hypoxia ([place]) 01/27/2017  . Hypertensive urgency 01/27/2017  . CHF (congestive heart failure) ([place]) 01/26/2017  . CKD (chronic kidney disease), stage III ([place]) 01/04/2017  . Essential hypertension 01/04/2017  . CVA (cerebral infarction) 12/12/2013  . Hyponatremia 12/06/2012  . Hiatal hernia 12/06/2012  . Syncope 12/06/2012  . Schizophrenia ([place])     Current Outpatient Medications:  .  albuterol (PROVENTIL HFA;VENTOLIN HFA) 108 (90 Base) MCG/ACT inhaler, Inhale 1-2 puffs into the lungs every 6 (six) hours as needed for wheezing or shortness of breath., Disp: , Rfl:  .  aspirin 81 MG EC tablet, TAKE 1 TABLET BY MOUTH EVERY DAY, Disp: 30 tablet, Rfl: 1 .  bisoprolol (ZEBETA) 10 MG tablet, TAKE 1 TABLET (10 MG TOTAL) BY MOUTH DAILY., Disp: 90 tablet, Rfl: 1 .  furosemide (LASIX) 40 MG tablet, TAKE 1 TABLET (40 MG TOTAL) BY MOUTH DAILY. IF WEIGHT GOES UP 2 TO 3 LBS IN A DAY OR 3 TO 4 LBS IN A WEEK, INCREASE TO 2 TABS IN AM AND 1 TAB IN THE PM, Disp: 60 tablet, Rfl: 6 .  haloperidol decanoate (HALDOL DECANOATE) 100 MG/ML injection, INJECT 1 ML EVERY 4 WEEKS, Disp: , Rfl:  .  isosorbide-hydrALAZINE (BIDIL) 20-37.5 MG tablet, Take 1 tablet by mouth 3 (three)  times daily., Disp: 180 tablet, Rfl: 6 .  losartan (COZAAR) 25 MG tablet, Take 1 tablet (25 mg total) by mouth 2 (two) times daily., Disp: 180 tablet, Rfl: 3 .  potassium chloride SA (K-DUR,KLOR-CON) 20 MEQ tablet, TAKE ONE TABLET BY MOUTH ONCE DAILY (AM), Disp: 30 tablet, Rfl: 6 .  spironolactone (ALDACTONE) 25 MG tablet, TAKE ONE TABLET BY MOUTH ONCE DAILY (BEDTIME), Disp: 30 tablet, Rfl: 5 .  atorvastatin (LIPITOR) 40 MG tablet, TAKE ONE TABLET BY MOUTH EVERY DAY IN THE EVENING, Disp: 30 tablet, Rfl: 6 .  clopidogrel (PLAVIX) 75 MG tablet, Take 75 mg by mouth daily., Disp: , Rfl:  .  Nicotine 21-14-7 MG/24HR KIT, Place 1 patch onto the skin daily. (Patient not taking: Reported on 01/31/2019), Disp: 56 each, Rfl: 1 .  SPIRIVA RESPIMAT 2.5 MCG/ACT AERS, INHALE 1 SPRAY INTO THE LUNGS DAILY., Disp: 4 g, Rfl: 5  Current Facility-Administered Medications:  .  methylPREDNISolone acetate (DEPO-MEDROL) injection 40 mg, 40 mg, Intra-articular, Once, [name], [name], MD Allergies  Allergen Reactions  . Sulfa Antibiotics Nausea Only     Social History   Socioeconomic History  . Marital status: Single    Spouse name: Not on file  . Number of children: Not on file  . Years of education: Not on file  . Highest education level: Not on file  Occupational History  . Not on file  Social Needs  . Financial  resource strain: Not on file  . Food insecurity    Worry: Not on file    Inability: Not on file  . Transportation needs    Medical: Not on file    Non-medical: Not on file  Tobacco Use  . Smoking status: Current Every Day Smoker    Packs/day: 1.50    Years: 44.00    Pack years: 66.00    Types: Cigarettes    Start date: 68  . Smokeless tobacco: Never Used  . Tobacco comment: 1ppd as of 12/06/17 ep  Substance and Sexual Activity  . Alcohol use: No  . Drug use: No  . Sexual activity: Not on file  Lifestyle  . Physical activity    Days per week: Not on file    Minutes per session: Not  on file  . Stress: Not on file  Relationships  . Social Herbalist on phone: Not on file    Gets together: Not on file    Attends religious service: Not on file    Active member of club or organization: Not on file    Attends meetings of clubs or organizations: Not on file    Relationship status: Not on file  . Intimate partner violence    Fear of current or ex partner: Not on file    Emotionally abused: Not on file    Physically abused: Not on file    Forced sexual activity: Not on file  Other Topics Concern  . Not on file  Social History Narrative  . Not on file    Physical Exam Pulmonary:     Effort: No respiratory distress.     Breath sounds: Wheezing present. No rales.     Comments: Wheezing noted to all his lung fields Abdominal:     General: There is no distension.  Musculoskeletal:     Right lower leg: No edema.     Left lower leg: No edema.  Skin:    General: Skin is warm and dry.         Future Appointments  Date Time Provider [place]  04/19/2019  1:40 PM [name], MD [facility] None     BP 110/80 (BP Location: Left Arm, Patient Position: Sitting, Cuff Size: Normal)   Pulse 77   Temp 99.2 F (37.3 C)   Resp 16   Wt 132 lb 6.4 oz (60.1 kg)   SpO2 97%   BMI 19.00 kg/m   Weight yesterday-didn't weigh Last visit weight-133  ATF pt CAO x4 sitting on the porch smoking cigarettes, with no complaints.  He said that he went almost a week without medications during my vacation.  katie came last week to refill his pill box.  He denies sob, chest pain and dizziness. He denies adding pillows to sleep at night.  He's taken all of his medications.  Mr. Loving reports that he has a job at [employer] (25 hrs a week).  rx bottles verified and pill box refilled. I told him to use his inhaler due to expiratory wheezing in all his lung fields.    Medication ordered: None  Virlee Stroschein, EMT  Paramedic [phone] 02/06/2019    ACTION: Home visit completed

## 2019-02-13 ENCOUNTER — Other Ambulatory Visit (HOSPITAL_COMMUNITY): Payer: Self-pay

## 2019-02-13 NOTE — Progress Notes (Signed)
Paramedicine Encounter    Patient ID: Gerald Nelson, male    DOB: May 12, 1951, 68 y.o.   MRN: [id number]    Patient Care Team: [name], MD as PCP - General (Internal Medicine) [name], MD as PCP - Advanced Heart Failure (Cardiology) [name], LCSW as Social Worker (Licensed Clinical Social Worker)  Patient Active Problem List   Diagnosis Date Noted  . Stage 3 severe COPD by GOLD classification ([place]) 11/14/2018  . Noncompliance 04/23/2017  . Mitral regurgitation 04/23/2017  . Thrombus - possible apical thrombus 01/2017 04/23/2017  . Hyperlipidemia 04/23/2017  . Pulmonary hypertension ([place])   . SOB (shortness of breath)   . Palliative care by specialist   . Tobacco abuse 04/19/2017  . Acute respiratory failure with hypoxia ([place]) 01/27/2017  . Hypertensive urgency 01/27/2017  . CHF (congestive heart failure) ([place]) 01/26/2017  . CKD (chronic kidney disease), stage III ([place]) 01/04/2017  . Essential hypertension 01/04/2017  . CVA (cerebral infarction) 12/12/2013  . Hyponatremia 12/06/2012  . Hiatal hernia 12/06/2012  . Syncope 12/06/2012  . Schizophrenia ([place])     Current Outpatient Medications:  .  albuterol (PROVENTIL HFA;VENTOLIN HFA) 108 (90 Base) MCG/ACT inhaler, Inhale 1-2 puffs into the lungs every 6 (six) hours as needed for wheezing or shortness of breath., Disp: , Rfl:  .  aspirin 81 MG EC tablet, TAKE 1 TABLET BY MOUTH EVERY DAY, Disp: 30 tablet, Rfl: 1 .  atorvastatin (LIPITOR) 40 MG tablet, TAKE ONE TABLET BY MOUTH EVERY DAY IN THE EVENING, Disp: 30 tablet, Rfl: 6 .  bisoprolol (ZEBETA) 10 MG tablet, TAKE 1 TABLET (10 MG TOTAL) BY MOUTH DAILY., Disp: 90 tablet, Rfl: 1 .  clopidogrel (PLAVIX) 75 MG tablet, Take 75 mg by mouth daily., Disp: , Rfl:  .  furosemide (LASIX) 40 MG tablet, TAKE 1 TABLET (40 MG TOTAL) BY MOUTH DAILY. IF WEIGHT GOES UP 2 TO 3 LBS IN A DAY OR 3 TO 4 LBS IN A WEEK, INCREASE TO 2 TABS IN AM AND 1 TAB IN THE PM, Disp: 60 tablet,  Rfl: 6 .  haloperidol decanoate (HALDOL DECANOATE) 100 MG/ML injection, INJECT 1 ML EVERY 4 WEEKS, Disp: , Rfl:  .  isosorbide-hydrALAZINE (BIDIL) 20-37.5 MG tablet, Take 1 tablet by mouth 3 (three) times daily., Disp: 180 tablet, Rfl: 6 .  losartan (COZAAR) 25 MG tablet, Take 1 tablet (25 mg total) by mouth 2 (two) times daily., Disp: 180 tablet, Rfl: 3 .  Nicotine 21-14-7 MG/24HR KIT, Place 1 patch onto the skin daily. (Patient not taking: Reported on 01/31/2019), Disp: 56 each, Rfl: 1 .  potassium chloride SA (K-DUR,KLOR-CON) 20 MEQ tablet, TAKE ONE TABLET BY MOUTH ONCE DAILY (AM), Disp: 30 tablet, Rfl: 6 .  SPIRIVA RESPIMAT 2.5 MCG/ACT AERS, INHALE 1 SPRAY INTO THE LUNGS DAILY., Disp: 4 g, Rfl: 5 .  spironolactone (ALDACTONE) 25 MG tablet, TAKE ONE TABLET BY MOUTH ONCE DAILY (BEDTIME), Disp: 30 tablet, Rfl: 5  Current Facility-Administered Medications:  .  methylPREDNISolone acetate (DEPO-MEDROL) injection 40 mg, 40 mg, Intra-articular, Once, [name], [name], MD Allergies  Allergen Reactions  . Sulfa Antibiotics Nausea Only     Social History   Socioeconomic History  . Marital status: Single    Spouse name: Not on file  . Number of children: Not on file  . Years of education: Not on file  . Highest education level: Not on file  Occupational History  . Not on file  Social Needs  . Financial  resource strain: Not on file  . Food insecurity    Worry: Not on file    Inability: Not on file  . Transportation needs    Medical: Not on file    Non-medical: Not on file  Tobacco Use  . Smoking status: Current Every Day Smoker    Packs/day: 1.50    Years: 44.00    Pack years: 66.00    Types: Cigarettes    Start date: 43  . Smokeless tobacco: Never Used  . Tobacco comment: 1ppd as of 12/06/17 ep  Substance and Sexual Activity  . Alcohol use: No  . Drug use: No  . Sexual activity: Not on file  Lifestyle  . Physical activity    Days per week: Not on file    Minutes per session:  Not on file  . Stress: Not on file  Relationships  . Social Herbalist on phone: Not on file    Gets together: Not on file    Attends religious service: Not on file    Active member of club or organization: Not on file    Attends meetings of clubs or organizations: Not on file    Relationship status: Not on file  . Intimate partner violence    Fear of current or ex partner: Not on file    Emotionally abused: Not on file    Physically abused: Not on file    Forced sexual activity: Not on file  Other Topics Concern  . Not on file  Social History Narrative  . Not on file    Physical Exam Pulmonary:     Effort: No respiratory distress.     Breath sounds: Wheezing present.     Comments: Slight wheeze in all four lung fields Abdominal:     General: Abdomen is flat.  Musculoskeletal:        General: No swelling.     Right lower leg: No edema.     Left lower leg: No edema.  Skin:    General: Skin is warm and dry.         Future Appointments  Date Time Provider [place]  04/19/2019  1:40 PM [name], MD [facility] None     BP 133/71 (BP Location: Right Arm, Patient Position: Sitting, Cuff Size: Normal)   Pulse 80   Temp 98.8 F (37.1 C) (Temporal)   Wt 134 lb 8 oz (61 kg)   SpO2 96%   BMI 19.30 kg/m   Weight yesterday-133 Last visit weight-132  ATF pt CAO x4 sitting on the porch, he just came home from work.  He said that he feels good and didn't have any issues with increased sob while working. He has taken his medications for this week besides the morning meds (he forgot).  I advised him to take his inhaler with him to work and everywhere he goes. Mr. Consalvo denies sob, chest pain and dizziness.  He hasn't increased the amount of pillows that he use to sleep at night. He hasn't been using the inhaler as prescribed, we discussed the importance of taking it. rx bottles verified and pill box refilled.     Medication  ordered: Potassium  Abbagail Scaff, EMT Paramedic [phone] 02/13/2019    ACTION: Home visit completed

## 2019-02-20 ENCOUNTER — Other Ambulatory Visit (HOSPITAL_COMMUNITY): Payer: Self-pay

## 2019-02-20 ENCOUNTER — Encounter: Payer: Self-pay | Admitting: Primary Care

## 2019-02-20 NOTE — Progress Notes (Signed)
Paramedicine Encounter    Patient ID: Gerald Nelson, male    DOB: May 12, 1951, 68 y.o.   MRN: [id number]    Patient Care Team: [name], MD as PCP - General (Internal Medicine) [name], MD as PCP - Advanced Heart Failure (Cardiology) [name], LCSW as Social Worker (Licensed Clinical Social Worker)  Patient Active Problem List   Diagnosis Date Noted  . Stage 3 severe COPD by GOLD classification ([place]) 11/14/2018  . Noncompliance 04/23/2017  . Mitral regurgitation 04/23/2017  . Thrombus - possible apical thrombus 01/2017 04/23/2017  . Hyperlipidemia 04/23/2017  . Pulmonary hypertension ([place])   . SOB (shortness of breath)   . Palliative care by specialist   . Tobacco abuse 04/19/2017  . Acute respiratory failure with hypoxia ([place]) 01/27/2017  . Hypertensive urgency 01/27/2017  . CHF (congestive heart failure) ([place]) 01/26/2017  . CKD (chronic kidney disease), stage III ([place]) 01/04/2017  . Essential hypertension 01/04/2017  . CVA (cerebral infarction) 12/12/2013  . Hyponatremia 12/06/2012  . Hiatal hernia 12/06/2012  . Syncope 12/06/2012  . Schizophrenia ([place])     Current Outpatient Medications:  .  albuterol (PROVENTIL HFA;VENTOLIN HFA) 108 (90 Base) MCG/ACT inhaler, Inhale 1-2 puffs into the lungs every 6 (six) hours as needed for wheezing or shortness of breath., Disp: , Rfl:  .  aspirin 81 MG EC tablet, TAKE 1 TABLET BY MOUTH EVERY DAY, Disp: 30 tablet, Rfl: 1 .  atorvastatin (LIPITOR) 40 MG tablet, TAKE ONE TABLET BY MOUTH EVERY DAY IN THE EVENING, Disp: 30 tablet, Rfl: 6 .  bisoprolol (ZEBETA) 10 MG tablet, TAKE 1 TABLET (10 MG TOTAL) BY MOUTH DAILY., Disp: 90 tablet, Rfl: 1 .  clopidogrel (PLAVIX) 75 MG tablet, Take 75 mg by mouth daily., Disp: , Rfl:  .  furosemide (LASIX) 40 MG tablet, TAKE 1 TABLET (40 MG TOTAL) BY MOUTH DAILY. IF WEIGHT GOES UP 2 TO 3 LBS IN A DAY OR 3 TO 4 LBS IN A WEEK, INCREASE TO 2 TABS IN AM AND 1 TAB IN THE PM, Disp: 60 tablet,  Rfl: 6 .  haloperidol decanoate (HALDOL DECANOATE) 100 MG/ML injection, INJECT 1 ML EVERY 4 WEEKS, Disp: , Rfl:  .  isosorbide-hydrALAZINE (BIDIL) 20-37.5 MG tablet, Take 1 tablet by mouth 3 (three) times daily., Disp: 180 tablet, Rfl: 6 .  losartan (COZAAR) 25 MG tablet, Take 1 tablet (25 mg total) by mouth 2 (two) times daily., Disp: 180 tablet, Rfl: 3 .  Nicotine 21-14-7 MG/24HR KIT, Place 1 patch onto the skin daily. (Patient not taking: Reported on 01/31/2019), Disp: 56 each, Rfl: 1 .  potassium chloride SA (K-DUR,KLOR-CON) 20 MEQ tablet, TAKE ONE TABLET BY MOUTH ONCE DAILY (AM), Disp: 30 tablet, Rfl: 6 .  SPIRIVA RESPIMAT 2.5 MCG/ACT AERS, INHALE 1 SPRAY INTO THE LUNGS DAILY., Disp: 4 g, Rfl: 5 .  spironolactone (ALDACTONE) 25 MG tablet, TAKE ONE TABLET BY MOUTH ONCE DAILY (BEDTIME), Disp: 30 tablet, Rfl: 5  Current Facility-Administered Medications:  .  methylPREDNISolone acetate (DEPO-MEDROL) injection 40 mg, 40 mg, Intra-articular, Once, [name], [name], MD Allergies  Allergen Reactions  . Sulfa Antibiotics Nausea Only     Social History   Socioeconomic History  . Marital status: Single    Spouse name: Not on file  . Number of children: Not on file  . Years of education: Not on file  . Highest education level: Not on file  Occupational History  . Not on file  Social Needs  . Financial  resource strain: Not on file  . Food insecurity    Worry: Not on file    Inability: Not on file  . Transportation needs    Medical: Not on file    Non-medical: Not on file  Tobacco Use  . Smoking status: Current Every Day Smoker    Packs/day: 1.50    Years: 44.00    Pack years: 66.00    Types: Cigarettes    Start date: 89  . Smokeless tobacco: Never Used  . Tobacco comment: 1ppd as of 12/06/17 ep  Substance and Sexual Activity  . Alcohol use: No  . Drug use: No  . Sexual activity: Not on file  Lifestyle  . Physical activity    Days per week: Not on file    Minutes per session:  Not on file  . Stress: Not on file  Relationships  . Social Herbalist on phone: Not on file    Gets together: Not on file    Attends religious service: Not on file    Active member of club or organization: Not on file    Attends meetings of clubs or organizations: Not on file    Relationship status: Not on file  . Intimate partner violence    Fear of current or ex partner: Not on file    Emotionally abused: Not on file    Physically abused: Not on file    Forced sexual activity: Not on file  Other Topics Concern  . Not on file  Social History Narrative  . Not on file    Physical Exam Pulmonary:     Effort: No respiratory distress.     Breath sounds: Wheezing present.     Comments: Wheezing noted to the left side Musculoskeletal:        General: No swelling.     Right lower leg: No edema.     Left lower leg: No edema.  Skin:    General: Skin is warm and dry.         Future Appointments  Date Time Provider [place]  03/20/2019  4:15 PM Martyn Ehrich, NP [facility] None  04/19/2019  1:40 PM [name], MD [facility] None     BP 110/78 (BP Location: Right Arm, Patient Position: Sitting, Cuff Size: Normal)   Pulse 74   Temp 99.3 F (37.4 C)   Wt 137 lb (62.1 kg)   BMI 19.66 kg/m   Weight yesterday-137 Last visit weight-134  ATF pt CAO x4 sitting on the porch, he just got home from work.  He said that work is going really well. He has no increase in sob while working.  [name] said that he's using the inhaler more often, mainly before work.  He doesn't use it while at work, there's wheezing noted to the left upper and lower lung fields.  He's taken all of his medications for the week. rx bottles verified and pill box refilled.     Medication ordered: Atorvastatin  [place]  Johniece Hornbaker, EMT Paramedic [phone] 02/20/2019    ACTION: Home visit completed

## 2019-03-06 ENCOUNTER — Other Ambulatory Visit (HOSPITAL_COMMUNITY): Payer: Self-pay

## 2019-03-06 NOTE — Progress Notes (Signed)
Paramedicine Encounter    Patient ID: Gerald Nelson, male    DOB: May 12, 1951, 68 y.o.   MRN: [id number]    Patient Care Team: [name], MD as PCP - General (Internal Medicine) [name], MD as PCP - Advanced Heart Failure (Cardiology) [name], LCSW as Social Worker (Licensed Clinical Social Worker)  Patient Active Problem List   Diagnosis Date Noted  . Stage 3 severe COPD by GOLD classification ([place]) 11/14/2018  . Noncompliance 04/23/2017  . Mitral regurgitation 04/23/2017  . Thrombus - possible apical thrombus 01/2017 04/23/2017  . Hyperlipidemia 04/23/2017  . Pulmonary hypertension ([place])   . SOB (shortness of breath)   . Palliative care by specialist   . Tobacco abuse 04/19/2017  . Acute respiratory failure with hypoxia ([place]) 01/27/2017  . Hypertensive urgency 01/27/2017  . CHF (congestive heart failure) ([place]) 01/26/2017  . CKD (chronic kidney disease), stage III ([place]) 01/04/2017  . Essential hypertension 01/04/2017  . CVA (cerebral infarction) 12/12/2013  . Hyponatremia 12/06/2012  . Hiatal hernia 12/06/2012  . Syncope 12/06/2012  . Schizophrenia ([place])     Current Outpatient Medications:  .  albuterol (PROVENTIL HFA;VENTOLIN HFA) 108 (90 Base) MCG/ACT inhaler, Inhale 1-2 puffs into the lungs every 6 (six) hours as needed for wheezing or shortness of breath., Disp: , Rfl:  .  aspirin 81 MG EC tablet, TAKE 1 TABLET BY MOUTH EVERY DAY, Disp: 30 tablet, Rfl: 1 .  atorvastatin (LIPITOR) 40 MG tablet, TAKE ONE TABLET BY MOUTH EVERY DAY IN THE EVENING, Disp: 30 tablet, Rfl: 6 .  bisoprolol (ZEBETA) 10 MG tablet, TAKE 1 TABLET (10 MG TOTAL) BY MOUTH DAILY., Disp: 90 tablet, Rfl: 1 .  clopidogrel (PLAVIX) 75 MG tablet, Take 75 mg by mouth daily., Disp: , Rfl:  .  furosemide (LASIX) 40 MG tablet, TAKE 1 TABLET (40 MG TOTAL) BY MOUTH DAILY. IF WEIGHT GOES UP 2 TO 3 LBS IN A DAY OR 3 TO 4 LBS IN A WEEK, INCREASE TO 2 TABS IN AM AND 1 TAB IN THE PM, Disp: 60 tablet,  Rfl: 6 .  haloperidol decanoate (HALDOL DECANOATE) 100 MG/ML injection, INJECT 1 ML EVERY 4 WEEKS, Disp: , Rfl:  .  isosorbide-hydrALAZINE (BIDIL) 20-37.5 MG tablet, Take 1 tablet by mouth 3 (three) times daily., Disp: 180 tablet, Rfl: 6 .  losartan (COZAAR) 25 MG tablet, Take 1 tablet (25 mg total) by mouth 2 (two) times daily., Disp: 180 tablet, Rfl: 3 .  Nicotine 21-14-7 MG/24HR KIT, Place 1 patch onto the skin daily. (Patient not taking: Reported on 01/31/2019), Disp: 56 each, Rfl: 1 .  potassium chloride SA (K-DUR,KLOR-CON) 20 MEQ tablet, TAKE ONE TABLET BY MOUTH ONCE DAILY (AM), Disp: 30 tablet, Rfl: 6 .  SPIRIVA RESPIMAT 2.5 MCG/ACT AERS, INHALE 1 SPRAY INTO THE LUNGS DAILY., Disp: 4 g, Rfl: 5 .  spironolactone (ALDACTONE) 25 MG tablet, TAKE ONE TABLET BY MOUTH ONCE DAILY (BEDTIME), Disp: 30 tablet, Rfl: 5  Current Facility-Administered Medications:  .  methylPREDNISolone acetate (DEPO-MEDROL) injection 40 mg, 40 mg, Intra-articular, Once, [name], [name], MD Allergies  Allergen Reactions  . Sulfa Antibiotics Nausea Only     Social History   Socioeconomic History  . Marital status: Single    Spouse name: Not on file  . Number of children: Not on file  . Years of education: Not on file  . Highest education level: Not on file  Occupational History  . Not on file  Social Needs  . Financial  resource strain: Not on file  . Food insecurity    Worry: Not on file    Inability: Not on file  . Transportation needs    Medical: Not on file    Non-medical: Not on file  Tobacco Use  . Smoking status: Current Every Day Smoker    Packs/day: 1.50    Years: 44.00    Pack years: 66.00    Types: Cigarettes    Start date: 62  . Smokeless tobacco: Never Used  . Tobacco comment: 1ppd as of 12/06/17 ep  Substance and Sexual Activity  . Alcohol use: No  . Drug use: No  . Sexual activity: Not on file  Lifestyle  . Physical activity    Days per week: Not on file    Minutes per session:  Not on file  . Stress: Not on file  Relationships  . Social Herbalist on phone: Not on file    Gets together: Not on file    Attends religious service: Not on file    Active member of club or organization: Not on file    Attends meetings of clubs or organizations: Not on file    Relationship status: Not on file  . Intimate partner violence    Fear of current or ex partner: Not on file    Emotionally abused: Not on file    Physically abused: Not on file    Forced sexual activity: Not on file  Other Topics Concern  . Not on file  Social History Narrative  . Not on file    Physical Exam Pulmonary:     Effort: Respiratory distress present.     Breath sounds: No wheezing or rales.     Comments: Pt's RR increased 22 bpm Abdominal:     General: There is no distension.  Musculoskeletal:     Right lower leg: No edema.     Left lower leg: No edema.  Skin:    General: Skin is warm and dry.         Future Appointments  Date Time Provider [place]  03/20/2019  4:15 PM Martyn Ehrich, NP [facility] None  04/19/2019  1:40 PM [name], MD [facility] None     BP 102/70 (BP Location: Left Arm, Patient Position: Sitting, Cuff Size: Normal)   Pulse 83   Wt 129 lb 12.8 oz (58.9 kg)   SpO2 97%   BMI 18.62 kg/m   Weight yesterday-didn't weigh Last visit weight-137  ATF pt CAO x4 sitting on the porch. He just came home from work; he denies chest pain and sob while working.  He said that "everything is going good".  Wheezing noted to left lung fields and upper right, diminished lung sounds in the right lower lung field.   He hasn't used the inhaler today at all.  He hasn't felt the need to use the inhaler at work but I encouraged him take Spiriva in the morning before walking to the bus stop. He denies dizziness, nausea and chest pain.  rx bottles verified and pill box refilled. The weight reported was significantly lower than last week, I will f/u with  him tomorrow.  He wouldn't allow me in his house today, he preferred to visit on the porch.   Medication ordered  ASA  Klay Sobotka, EMT Paramedic [phone] 03/06/2019    ACTION: Home visit completed

## 2019-03-13 ENCOUNTER — Other Ambulatory Visit (HOSPITAL_COMMUNITY): Payer: Self-pay

## 2019-03-13 NOTE — Progress Notes (Signed)
Paramedicine Encounter    Patient ID: Gerald Nelson, male    DOB: May 12, 1951, 68 y.o.   MRN: [id number]    Patient Care Team: [name], MD as PCP - General (Internal Medicine) [name], MD as PCP - Advanced Heart Failure (Cardiology) [name], LCSW as Social Worker (Licensed Clinical Social Worker)  Patient Active Problem List   Diagnosis Date Noted  . Stage 3 severe COPD by GOLD classification ([place]) 11/14/2018  . Noncompliance 04/23/2017  . Mitral regurgitation 04/23/2017  . Thrombus - possible apical thrombus 01/2017 04/23/2017  . Hyperlipidemia 04/23/2017  . Pulmonary hypertension ([place])   . SOB (shortness of breath)   . Palliative care by specialist   . Tobacco abuse 04/19/2017  . Acute respiratory failure with hypoxia ([place]) 01/27/2017  . Hypertensive urgency 01/27/2017  . CHF (congestive heart failure) ([place]) 01/26/2017  . CKD (chronic kidney disease), stage III ([place]) 01/04/2017  . Essential hypertension 01/04/2017  . CVA (cerebral infarction) 12/12/2013  . Hyponatremia 12/06/2012  . Hiatal hernia 12/06/2012  . Syncope 12/06/2012  . Schizophrenia ([place])     Current Outpatient Medications:  .  albuterol (PROVENTIL HFA;VENTOLIN HFA) 108 (90 Base) MCG/ACT inhaler, Inhale 1-2 puffs into the lungs every 6 (six) hours as needed for wheezing or shortness of breath., Disp: , Rfl:  .  aspirin 81 MG EC tablet, TAKE 1 TABLET BY MOUTH EVERY DAY, Disp: 30 tablet, Rfl: 1 .  atorvastatin (LIPITOR) 40 MG tablet, TAKE ONE TABLET BY MOUTH EVERY DAY IN THE EVENING, Disp: 30 tablet, Rfl: 6 .  bisoprolol (ZEBETA) 10 MG tablet, TAKE 1 TABLET (10 MG TOTAL) BY MOUTH DAILY., Disp: 90 tablet, Rfl: 1 .  clopidogrel (PLAVIX) 75 MG tablet, Take 75 mg by mouth daily., Disp: , Rfl:  .  furosemide (LASIX) 40 MG tablet, TAKE 1 TABLET (40 MG TOTAL) BY MOUTH DAILY. IF WEIGHT GOES UP 2 TO 3 LBS IN A DAY OR 3 TO 4 LBS IN A WEEK, INCREASE TO 2 TABS IN AM AND 1 TAB IN THE PM, Disp: 60 tablet,  Rfl: 6 .  haloperidol decanoate (HALDOL DECANOATE) 100 MG/ML injection, INJECT 1 ML EVERY 4 WEEKS, Disp: , Rfl:  .  isosorbide-hydrALAZINE (BIDIL) 20-37.5 MG tablet, Take 1 tablet by mouth 3 (three) times daily., Disp: 180 tablet, Rfl: 6 .  losartan (COZAAR) 25 MG tablet, Take 1 tablet (25 mg total) by mouth 2 (two) times daily., Disp: 180 tablet, Rfl: 3 .  Nicotine 21-14-7 MG/24HR KIT, Place 1 patch onto the skin daily. (Patient not taking: Reported on 01/31/2019), Disp: 56 each, Rfl: 1 .  potassium chloride SA (K-DUR,KLOR-CON) 20 MEQ tablet, TAKE ONE TABLET BY MOUTH ONCE DAILY (AM), Disp: 30 tablet, Rfl: 6 .  SPIRIVA RESPIMAT 2.5 MCG/ACT AERS, INHALE 1 SPRAY INTO THE LUNGS DAILY., Disp: 4 g, Rfl: 5 .  spironolactone (ALDACTONE) 25 MG tablet, TAKE ONE TABLET BY MOUTH ONCE DAILY (BEDTIME), Disp: 30 tablet, Rfl: 5  Current Facility-Administered Medications:  .  methylPREDNISolone acetate (DEPO-MEDROL) injection 40 mg, 40 mg, Intra-articular, Once, [name], [name], MD Allergies  Allergen Reactions  . Sulfa Antibiotics Nausea Only     Social History   Socioeconomic History  . Marital status: Single    Spouse name: Not on file  . Number of children: Not on file  . Years of education: Not on file  . Highest education level: Not on file  Occupational History  . Not on file  Social Needs  . Financial  resource strain: Not on file  . Food insecurity    Worry: Not on file    Inability: Not on file  . Transportation needs    Medical: Not on file    Non-medical: Not on file  Tobacco Use  . Smoking status: Current Every Day Smoker    Packs/day: 1.50    Years: 44.00    Pack years: 66.00    Types: Cigarettes    Start date: 75  . Smokeless tobacco: Never Used  . Tobacco comment: 1ppd as of 12/06/17 ep  Substance and Sexual Activity  . Alcohol use: No  . Drug use: No  . Sexual activity: Not on file  Lifestyle  . Physical activity    Days per week: Not on file    Minutes per session:  Not on file  . Stress: Not on file  Relationships  . Social Herbalist on phone: Not on file    Gets together: Not on file    Attends religious service: Not on file    Active member of club or organization: Not on file    Attends meetings of clubs or organizations: Not on file    Relationship status: Not on file  . Intimate partner violence    Fear of current or ex partner: Not on file    Emotionally abused: Not on file    Physically abused: Not on file    Forced sexual activity: Not on file  Other Topics Concern  . Not on file  Social History Narrative  . Not on file    Physical Exam Pulmonary:     Effort: Pulmonary effort is normal. No respiratory distress.     Breath sounds: No wheezing or rales.  Abdominal:     General: There is no distension.  Musculoskeletal:        General: No swelling.     Right lower leg: No edema.     Left lower leg: No edema.  Skin:    General: Skin is warm and dry.         Future Appointments  Date Time Provider [place]  03/20/2019  4:15 PM Martyn Ehrich, NP [facility] None  04/19/2019  1:40 PM [name], MD [facility] None     BP 94/70 (BP Location: Left Arm, Patient Position: Sitting, Cuff Size: Normal)   Pulse 73   Temp 97.6 F (36.4 C)   Wt 133 lb (60.3 kg)   SpO2 98%   BMI 19.08 kg/m   Weight yesterday-didn't weigh Last visit weight-129  ATF pt CAO x4 sitting on the porch smoking a cigarette, he just came home from work.  He has no complaints at this time.  He just took his afternoon bidil at 3:00 once he got home from work.  He's taken all of his medications this week; haldol given last week at his psychiatrist office.  rx bottles verified and pill box refilled.    Medication ordered: Spirolactone Potassium  Ruey Storer, EMT Paramedic [phone] 03/13/2019    ACTION: Home visit completed

## 2019-03-20 ENCOUNTER — Other Ambulatory Visit (HOSPITAL_COMMUNITY): Payer: Self-pay

## 2019-03-20 ENCOUNTER — Other Ambulatory Visit: Payer: Self-pay | Admitting: Student

## 2019-03-20 ENCOUNTER — Ambulatory Visit: Payer: Medicare Other | Admitting: Primary Care

## 2019-03-20 NOTE — Progress Notes (Signed)
$'@Patient'T$  ID: Gerald Nelson, male    DOB: 1951-06-26, 68 y.o.   MRN: [id number]  Chief Complaint  Patient presents with  . Follow-up    Referring provider: [name], MD  HPI: 68 year old male, current every day smoker (66-pack-year history).  Last medical history significant for COPD stage III, hypertension, mitral regurgitation, pulmonary hypertension, syncope, apical thrombus, CHF, CVA, chronic kidney disease stage III, hyperlipidemia, schizophrenia. Patient of Dr. Chase Caller, last seen on 11 11/14/2018. Maintained on Symbicort and Spiriva. Low dose CT chest in April 2020 showed lung RADS 2. Recommend annual screening, next due April 2021.   03/21/2019 Patient presents today for 63-monthfollow-up. [facility] and was started on Trelegy. He has not started medication yet and has some wheezing that started today. Has baseline dry cough. Still smoking 2ppd. Smoking cessation reviewed at length during last tele-visit.  Prescription sent for nicotine patch starter kit to pharmacy.    Allergies  Allergen Reactions  . Sulfa Antibiotics Nausea Only    Immunization History  Administered Date(s) Administered  . Influenza Inj Mdck Quad Pf 04/22/2018  . Influenza, High Dose Seasonal PF 04/16/2017  . Pneumococcal Conjugate-13 06/22/2017    Past Medical History:  Diagnosis Date  . Apical mural thrombus    a. question of apical thrombus on echo 01/2017, pt left AMA, not felt to be anticoag candidate with noncompliance.  . Arthritis   . CHF (congestive heart failure) ([place]   . Chronic systolic CHF (congestive heart failure) ([facility]    a. pt refused cath. EF 15% 01/2017.  . CKD (chronic kidney disease), stage III ([place] [place]   . COPD (chronic obstructive pulmonary disease) ([facility]   . Hernia, hiatal   . Hyperlipidemia   . Hypertension   . Hyponatremia   . Mitral regurgitation    a. mod-severe by echo 01/2017.  . Pulmonary hypertension ([facility]   .  Schizophrenia ([facility]   . Tobacco abuse     Tobacco History: Social History   Tobacco Use  Smoking Status Current Every Day Smoker  . Packs/day: 1.50  . Years: 44.00  . Pack years: 66.00  . Types: Cigarettes  . Start date: 1974  Smokeless Tobacco Never Used  Tobacco Comment   1ppd as of 12/06/17 ep   Ready to quit: Not Answered Counseling given: Not Answered Comment: 1ppd as of 12/06/17 ep   Outpatient Medications Prior to Visit  Medication Sig Dispense Refill  . albuterol (PROVENTIL HFA;VENTOLIN HFA) 108 (90 Base) MCG/ACT inhaler Inhale 1-2 puffs into the lungs every 6 (six) hours as needed for wheezing or shortness of breath.    .Marland Kitchenaspirin 81 MG EC tablet TAKE 1 TABLET BY MOUTH EVERY DAY 30 tablet 1  . atorvastatin (LIPITOR) 40 MG tablet TAKE ONE TABLET BY MOUTH EVERY DAY IN THE EVENING 30 tablet 6  . bisoprolol (ZEBETA) 10 MG tablet TAKE 1 TABLET (10 MG TOTAL) BY MOUTH DAILY. 90 tablet 1  . clopidogrel (PLAVIX) 75 MG tablet Take 75 mg by mouth daily.    . Fluticasone-Umeclidin-Vilant (TRELEGY ELLIPTA) 100-62.5-25 MCG/INH AEPB Inhale 1 puff into the lungs.    . furosemide (LASIX) 40 MG tablet TAKE 1 TABLET (40 MG TOTAL) BY MOUTH DAILY. IF WEIGHT GOES UP 2 TO 3 LBS IN A DAY OR 3 TO 4 LBS IN A WEEK, INCREASE TO 2 TABS IN AM AND 1 TAB IN THE PM 60 tablet 6  . haloperidol decanoate (HALDOL DECANOATE) 100 MG/ML  injection INJECT 1 ML EVERY 4 WEEKS    . isosorbide-hydrALAZINE (BIDIL) 20-37.5 MG tablet Take 1 tablet by mouth 3 (three) times daily. 180 tablet 6  . losartan (COZAAR) 25 MG tablet Take 1 tablet (25 mg total) by mouth 2 (two) times daily. 180 tablet 3  . Nicotine 21-14-7 MG/24HR KIT Place 1 patch onto the skin daily. 56 each 1  . potassium chloride SA (K-DUR) 20 MEQ tablet TAKE ONE TABLET BY MOUTH ONCE DAILY (AM) 30 tablet 6  . spironolactone (ALDACTONE) 25 MG tablet TAKE ONE TABLET BY MOUTH ONCE DAILY (BEDTIME) 30 tablet 5  . SPIRIVA RESPIMAT 2.5 MCG/ACT AERS INHALE 1 SPRAY INTO  THE LUNGS DAILY. 4 g 5   Facility-Administered Medications Prior to Visit  Medication Dose Route Frequency Provider Last Rate Last Dose  . methylPREDNISolone acetate (DEPO-MEDROL) injection 40 mg  40 mg Intra-articular Once [name], [name], MD        Review of Systems  Review of Systems  Constitutional: Negative.   HENT: Negative.   Respiratory: Positive for cough and wheezing.   Cardiovascular: Negative.    Physical Exam  BP 110/64 (BP Location: Left Arm, Cuff Size: Normal)   Pulse 77   Temp (!) 97.2 F (36.2 C) (Oral)   Ht '5\' 10"'$  (1.778 m)   Wt 136 lb 3.2 oz (61.8 kg)   SpO2 94%   BMI 19.54 kg/m  Physical Exam Constitutional:      General: He is not in acute distress.    Appearance: Normal appearance. He is not toxic-appearing.  Eyes:     General: Scleral icterus present.     Pupils: Pupils are equal, round, and reactive to light.  Cardiovascular:     Rate and Rhythm: Normal rate and regular rhythm.  Pulmonary:     Effort: Pulmonary effort is normal. No respiratory distress.     Breath sounds: Wheezing present.  Neurological:     Mental Status: He is alert.  Psychiatric:        Mood and Affect: Mood normal.        Behavior: Behavior normal.        Thought Content: Thought content normal.        Judgment: Judgment normal.      Lab Results:  CBC    Component Value Date/Time   WBC 6.8 04/18/2017 2026   RBC 4.68 04/18/2017 2026   HGB 14.4 04/18/2017 2026   HCT 42.1 04/18/2017 2026   PLT 234 04/18/2017 2026   MCV 90.0 04/18/2017 2026   MCH 30.8 04/18/2017 2026   MCHC 34.2 04/18/2017 2026   RDW 14.1 04/18/2017 2026   LYMPHSABS 1.4 01/27/2017 0744   MONOABS 0.8 01/27/2017 0744   EOSABS 0.1 01/27/2017 0744   BASOSABS 0.0 01/27/2017 0744    BMET    Component Value Date/Time   NA 136 01/24/2019 1420   K 4.3 01/24/2019 1420   CL 103 01/24/2019 1420   CO2 26 01/24/2019 1420   GLUCOSE 108 (H) 01/24/2019 1420   BUN 29 (H) 01/24/2019 1420   CREATININE  1.91 (H) 01/24/2019 1420   CALCIUM 9.6 01/24/2019 1420   GFRNONAA 36 (L) 01/24/2019 1420   GFRAA 41 (L) 01/24/2019 1420    BNP    Component Value Date/Time   BNP 2,204.0 (H) 04/18/2017 2026    ProBNP No results found for: PROBNP  Imaging: No results found.   Assessment & Plan:   Stage 3 severe COPD by GOLD classification ([facility]) - Mild  to moderate wheezing on exam - Changed to Trelegy per PCP 03/20/19 (samples given today) - RX prednisone '20mg'$  x 5 days - Reinforced importance of taking inhaler as prescribed EVERYDAY and needs to cut back his smoking  - Up to date with influenza vaccine   Tobacco abuse - Smoking cessation reviewed at length (focus on tapering amount for now) - Continue nicotine patch/gum as prescribed - Due for LDCT in April 2021    Martyn Ehrich, NP 03/21/2019

## 2019-03-20 NOTE — Progress Notes (Signed)
Paramedicine Encounter    Patient ID: Gerald Nelson, male    DOB: May 12, 1951, 68 y.o.   MRN: [id number]    Patient Care Team: [name], MD as PCP - General (Internal Medicine) [name], MD as PCP - Advanced Heart Failure (Cardiology) [name], LCSW as Social Worker (Licensed Clinical Social Worker)  Patient Active Problem List   Diagnosis Date Noted  . Stage 3 severe COPD by GOLD classification ([place]) 11/14/2018  . Noncompliance 04/23/2017  . Mitral regurgitation 04/23/2017  . Thrombus - possible apical thrombus 01/2017 04/23/2017  . Hyperlipidemia 04/23/2017  . Pulmonary hypertension ([place])   . SOB (shortness of breath)   . Palliative care by specialist   . Tobacco abuse 04/19/2017  . Acute respiratory failure with hypoxia ([place]) 01/27/2017  . Hypertensive urgency 01/27/2017  . CHF (congestive heart failure) ([place]) 01/26/2017  . CKD (chronic kidney disease), stage III ([place]) 01/04/2017  . Essential hypertension 01/04/2017  . CVA (cerebral infarction) 12/12/2013  . Hyponatremia 12/06/2012  . Hiatal hernia 12/06/2012  . Syncope 12/06/2012  . Schizophrenia ([place])     Current Outpatient Medications:  .  albuterol (PROVENTIL HFA;VENTOLIN HFA) 108 (90 Base) MCG/ACT inhaler, Inhale 1-2 puffs into the lungs every 6 (six) hours as needed for wheezing or shortness of breath., Disp: , Rfl:  .  aspirin 81 MG EC tablet, TAKE 1 TABLET BY MOUTH EVERY DAY, Disp: 30 tablet, Rfl: 1 .  atorvastatin (LIPITOR) 40 MG tablet, TAKE ONE TABLET BY MOUTH EVERY DAY IN THE EVENING, Disp: 30 tablet, Rfl: 6 .  bisoprolol (ZEBETA) 10 MG tablet, TAKE 1 TABLET (10 MG TOTAL) BY MOUTH DAILY., Disp: 90 tablet, Rfl: 1 .  clopidogrel (PLAVIX) 75 MG tablet, Take 75 mg by mouth daily., Disp: , Rfl:  .  furosemide (LASIX) 40 MG tablet, TAKE 1 TABLET (40 MG TOTAL) BY MOUTH DAILY. IF WEIGHT GOES UP 2 TO 3 LBS IN A DAY OR 3 TO 4 LBS IN A WEEK, INCREASE TO 2 TABS IN AM AND 1 TAB IN THE PM, Disp: 60 tablet,  Rfl: 6 .  haloperidol decanoate (HALDOL DECANOATE) 100 MG/ML injection, INJECT 1 ML EVERY 4 WEEKS, Disp: , Rfl:  .  isosorbide-hydrALAZINE (BIDIL) 20-37.5 MG tablet, Take 1 tablet by mouth 3 (three) times daily., Disp: 180 tablet, Rfl: 6 .  losartan (COZAAR) 25 MG tablet, Take 1 tablet (25 mg total) by mouth 2 (two) times daily., Disp: 180 tablet, Rfl: 3 .  Nicotine 21-14-7 MG/24HR KIT, Place 1 patch onto the skin daily. (Patient not taking: Reported on 01/31/2019), Disp: 56 each, Rfl: 1 .  potassium chloride SA (K-DUR,KLOR-CON) 20 MEQ tablet, TAKE ONE TABLET BY MOUTH ONCE DAILY (AM), Disp: 30 tablet, Rfl: 6 .  SPIRIVA RESPIMAT 2.5 MCG/ACT AERS, INHALE 1 SPRAY INTO THE LUNGS DAILY., Disp: 4 g, Rfl: 5 .  spironolactone (ALDACTONE) 25 MG tablet, TAKE ONE TABLET BY MOUTH ONCE DAILY (BEDTIME), Disp: 30 tablet, Rfl: 5  Current Facility-Administered Medications:  .  methylPREDNISolone acetate (DEPO-MEDROL) injection 40 mg, 40 mg, Intra-articular, Once, [name], [name], MD Allergies  Allergen Reactions  . Sulfa Antibiotics Nausea Only     Social History   Socioeconomic History  . Marital status: Single    Spouse name: Not on file  . Number of children: Not on file  . Years of education: Not on file  . Highest education level: Not on file  Occupational History  . Not on file  Social Needs  . Financial  resource strain: Not on file  . Food insecurity    Worry: Not on file    Inability: Not on file  . Transportation needs    Medical: Not on file    Non-medical: Not on file  Tobacco Use  . Smoking status: Current Every Day Smoker    Packs/day: 1.50    Years: 44.00    Pack years: 66.00    Types: Cigarettes    Start date: 18  . Smokeless tobacco: Never Used  . Tobacco comment: 1ppd as of 12/06/17 ep  Substance and Sexual Activity  . Alcohol use: No  . Drug use: No  . Sexual activity: Not on file  Lifestyle  . Physical activity    Days per week: Not on file    Minutes per session:  Not on file  . Stress: Not on file  Relationships  . Social Herbalist on phone: Not on file    Gets together: Not on file    Attends religious service: Not on file    Active member of club or organization: Not on file    Attends meetings of clubs or organizations: Not on file    Relationship status: Not on file  . Intimate partner violence    Fear of current or ex partner: Not on file    Emotionally abused: Not on file    Physically abused: Not on file    Forced sexual activity: Not on file  Other Topics Concern  . Not on file  Social History Narrative  . Not on file    Physical Exam Pulmonary:     Breath sounds: No wheezing.  Abdominal:     General: There is no distension.  Musculoskeletal:        General: No swelling.     Right lower leg: No edema.     Left lower leg: No edema.  Skin:    General: Skin is warm and dry.         Future Appointments  Date Time Provider [place]  03/21/2019 11:00 AM Martyn Ehrich, NP [facility] None  04/19/2019  1:40 PM [name], MD [facility] None     There were no vitals taken for this visit.  Weight yesterday-didn't weigh Last visit weight-133  ATF pt CAO x4 sitting on the porch.  He said that he just came home for Dr. Sherilyn Cooter office. Dr. Deforest Hoyles stopped spriva and dulera and added Trelegy. He hasn't used his inhaler today, so I told him to use it because he is wheezing in his upper lung fields.  He denies sob, chest pain and dizziness. Rx bottles verified and pill box refilled. He's out of potassium and spirolactone.  I will pick them up for him tomorrow.  He has a appointment with his pulmonologist tomorrow, I told him to take his revised med list to that appointment.  rx bottles verified and pill box refilled.   Medication ordered: bidil ( too early) Spirolactone Potassium  Melena Hayes, EMT Paramedic [phone] 03/20/2019    ACTION: Home visit completed

## 2019-03-21 ENCOUNTER — Other Ambulatory Visit: Payer: Self-pay

## 2019-03-21 ENCOUNTER — Other Ambulatory Visit (HOSPITAL_COMMUNITY): Payer: Self-pay

## 2019-03-21 ENCOUNTER — Ambulatory Visit (INDEPENDENT_AMBULATORY_CARE_PROVIDER_SITE_OTHER): Payer: Medicare Other | Admitting: Primary Care

## 2019-03-21 ENCOUNTER — Encounter: Payer: Self-pay | Admitting: Primary Care

## 2019-03-21 DIAGNOSIS — J449 Chronic obstructive pulmonary disease, unspecified: Secondary | ICD-10-CM | POA: Diagnosis not present

## 2019-03-21 DIAGNOSIS — Z720 Tobacco use: Secondary | ICD-10-CM | POA: Diagnosis not present

## 2019-03-21 MED ORDER — TRELEGY ELLIPTA 100-62.5-25 MCG/INH IN AEPB: 1.0000 | INHALATION_SPRAY | Freq: Every day | RESPIRATORY_TRACT | 0 refills | Status: DC

## 2019-03-21 MED ORDER — PREDNISONE 10 MG PO TABS: ORAL_TABLET | ORAL | 0 refills | Status: DC

## 2019-03-21 NOTE — Assessment & Plan Note (Signed)
-   Smoking cessation reviewed at length (focus on tapering amount for now) - Continue nicotine patch/gum as prescribed - Due for LDCT in April 2021

## 2019-03-21 NOTE — Patient Instructions (Addendum)
- Continue Trelegy 1 puff daily (sample given, prescription sent by primary care) - Use Albuterol rescue inahler every 4-6 hours for breakthrough wheezing/shortness of breath  Two things you can do to help your breathing 1. Take your TRELEGY inhaler EVERY day 2. Cut BACK the amount you smoke (SMOKE LESS!)  Follow-up: - 6 months with Dr. Chase Caller OR sooner if needed - Due for Low dose CT scan in April 2021     Chronic Obstructive Pulmonary Disease Chronic obstructive pulmonary disease (COPD) is a long-term (chronic) lung problem. When you have COPD, it is hard for air to get in and out of your lungs. Usually the condition gets worse over time, and your lungs will never return to normal. There are things you can do to keep yourself as healthy as possible.  Your doctor may treat your condition with: ? Medicines. ? Oxygen. ? Lung surgery.  Your doctor may also recommend: ? Rehabilitation. This includes steps to make your body work better. It may involve a team of specialists. ? Quitting smoking, if you smoke. ? Exercise and changes to your diet. ? Comfort measures (palliative care). Follow these instructions at home: Medicines  Take over-the-counter and prescription medicines only as told by your doctor.  Talk to your doctor before taking any cough or allergy medicines. You may need to avoid medicines that cause your lungs to be dry. Lifestyle  If you smoke, stop. Smoking makes the problem worse. If you need help quitting, ask your doctor.  Avoid being around things that make your breathing worse. This may include smoke, chemicals, and fumes.  Stay active, but remember to rest as well.  Learn and use tips on how to relax.  Make sure you get enough sleep. Most adults need at least 7 hours of sleep every night.  Eat healthy foods. Eat smaller meals more often. Rest before meals. Controlled breathing Learn and use tips on how to control your breathing as told by your doctor.  Try:  Breathing in (inhaling) through your nose for 1 second. Then, pucker your lips and breath out (exhale) through your lips for 2 seconds.  Putting one hand on your belly (abdomen). Breathe in slowly through your nose for 1 second. Your hand on your belly should move out. Pucker your lips and breathe out slowly through your lips. Your hand on your belly should move in as you breathe out.  Controlled coughing Learn and use controlled coughing to clear mucus from your lungs. Follow these steps: 1. Lean your head a little forward. 2. Breathe in deeply. 3. Try to hold your breath for 3 seconds. 4. Keep your mouth slightly open while coughing 2 times. 5. Spit any mucus out into a tissue. 6. Rest and do the steps again 1 or 2 times as needed. General instructions  Make sure you get all the shots (vaccines) that your doctor recommends. Ask your doctor about a flu shot and a pneumonia shot.  Use oxygen therapy and pulmonary rehabilitation if told by your doctor. If you need home oxygen therapy, ask your doctor if you should buy a tool to measure your oxygen level (oximeter).  Make a COPD action plan with your doctor. This helps you to know what to do if you feel worse than usual.  Manage any other conditions you have as told by your doctor.  Avoid going outside when it is very hot, cold, or humid.  Avoid people who have a sickness you can catch (contagious).  Keep all  follow-up visits as told by your doctor. This is important. Contact a doctor if:  You cough up more mucus than usual.  There is a change in the color or thickness of the mucus.  It is harder to breathe than usual.  Your breathing is faster than usual.  You have trouble sleeping.  You need to use your medicines more often than usual.  You have trouble doing your normal activities such as getting dressed or walking around the house. Get help right away if:  You have shortness of breath while resting.  You have  shortness of breath that stops you from: ? Being able to talk. ? Doing normal activities.  Your chest hurts for longer than 5 minutes.  Your skin color is more blue than usual.  Your pulse oximeter shows that you have low oxygen for longer than 5 minutes.  You have a fever.  You feel too tired to breathe normally. Summary  Chronic obstructive pulmonary disease (COPD) is a long-term lung problem.  The way your lungs work will never return to normal. Usually the condition gets worse over time. There are things you can do to keep yourself as healthy as possible.  Take over-the-counter and prescription medicines only as told by your doctor.  If you smoke, stop. Smoking makes the problem worse. This information is not intended to replace advice given to you by your health care provider. Make sure you discuss any questions you have with your health care provider. Document Released: 12/08/2007 Document Revised: 06/03/2017 Document Reviewed: 07/26/2016 Elsevier Patient Education  2020 [employer].

## 2019-03-21 NOTE — Assessment & Plan Note (Signed)
-   Mild to moderate wheezing on exam - Changed to Trelegy per PCP 03/20/19 (samples given today) - RX prednisone 20mg  x 5 days - Reinforced importance of taking inhaler as prescribed EVERYDAY and needs to cut back his smoking  - Up to date with influenza vaccine

## 2019-03-21 NOTE — Addendum Note (Signed)
Addended by: Jannette Spanner on: 03/21/2019 01:22 PM   Modules accepted: Orders

## 2019-03-22 NOTE — Progress Notes (Signed)
I picked up pt's medications from [facility] and finished his pill box.

## 2019-03-26 ENCOUNTER — Telehealth (HOSPITAL_COMMUNITY): Payer: Self-pay | Admitting: Licensed Clinical Social Worker

## 2019-03-26 NOTE — Telephone Encounter (Addendum)
CSW reached out to pt to check in regarding food and medication status at this time- pt without concerns at this time.  States he has started a new job at [employer] which is going well- states he is staying safe and wears a mask to work each day.  Pt got his job through [employer] and they are also working on getting him an apartment.  CSW will continue to follow through the community paramedicine program  [name], LCSW Clinical Social Worker [facility] [phone]

## 2019-03-27 ENCOUNTER — Other Ambulatory Visit (HOSPITAL_COMMUNITY): Payer: Self-pay

## 2019-03-27 NOTE — Progress Notes (Signed)
Paramedicine Encounter    Patient ID: Gerald Nelson, male    DOB: 09-18-1950, 68 y.o.   MRN: [id number]    Patient Care Team: [name], MD as PCP - General (Internal Medicine) [name], MD as PCP - Advanced Heart Failure (Cardiology) [name], LCSW as Social Worker (Licensed Clinical Social Worker)  Patient Active Problem List   Diagnosis Date Noted  . Stage 3 severe COPD by GOLD classification ([place]) 11/14/2018  . Noncompliance 04/23/2017  . Mitral regurgitation 04/23/2017  . Thrombus - possible apical thrombus 01/2017 04/23/2017  . Hyperlipidemia 04/23/2017  . Pulmonary hypertension ([place])   . SOB (shortness of breath)   . Palliative care by specialist   . Tobacco abuse 04/19/2017  . Acute respiratory failure with hypoxia ([place]) 01/27/2017  . Hypertensive urgency 01/27/2017  . CHF (congestive heart failure) ([place]) 01/26/2017  . CKD (chronic kidney disease), stage III ([place]) 01/04/2017  . Essential hypertension 01/04/2017  . CVA (cerebral infarction) 12/12/2013  . Hyponatremia 12/06/2012  . Hiatal hernia 12/06/2012  . Syncope 12/06/2012  . Schizophrenia ([place])     Current Outpatient Medications:  .  albuterol (PROVENTIL HFA;VENTOLIN HFA) 108 (90 Base) MCG/ACT inhaler, Inhale 1-2 puffs into the lungs every 6 (six) hours as needed for wheezing or shortness of breath., Disp: , Rfl:  .  aspirin 81 MG EC tablet, TAKE 1 TABLET BY MOUTH EVERY DAY, Disp: 30 tablet, Rfl: 1 .  atorvastatin (LIPITOR) 40 MG tablet, TAKE ONE TABLET BY MOUTH EVERY DAY IN THE EVENING, Disp: 30 tablet, Rfl: 6 .  bisoprolol (ZEBETA) 10 MG tablet, TAKE 1 TABLET (10 MG TOTAL) BY MOUTH DAILY., Disp: 90 tablet, Rfl: 1 .  clopidogrel (PLAVIX) 75 MG tablet, Take 75 mg by mouth daily., Disp: , Rfl:  .  Fluticasone-Umeclidin-Vilant (TRELEGY ELLIPTA) 100-62.5-25 MCG/INH AEPB, Inhale 1 puff into the lungs., Disp: , Rfl:  .  Fluticasone-Umeclidin-Vilant (TRELEGY ELLIPTA) 100-62.5-25 MCG/INH AEPB, Inhale 1  puff into the lungs daily., Disp: 2 each, Rfl: 0 .  furosemide (LASIX) 40 MG tablet, TAKE 1 TABLET (40 MG TOTAL) BY MOUTH DAILY. IF WEIGHT GOES UP 2 TO 3 LBS IN A DAY OR 3 TO 4 LBS IN A WEEK, INCREASE TO 2 TABS IN AM AND 1 TAB IN THE PM, Disp: 60 tablet, Rfl: 6 .  haloperidol decanoate (HALDOL DECANOATE) 100 MG/ML injection, INJECT 1 ML EVERY 4 WEEKS, Disp: , Rfl:  .  isosorbide-hydrALAZINE (BIDIL) 20-37.5 MG tablet, Take 1 tablet by mouth 3 (three) times daily., Disp: 180 tablet, Rfl: 6 .  losartan (COZAAR) 25 MG tablet, Take 1 tablet (25 mg total) by mouth 2 (two) times daily., Disp: 180 tablet, Rfl: 3 .  Nicotine 21-14-7 MG/24HR KIT, Place 1 patch onto the skin daily., Disp: 56 each, Rfl: 1 .  potassium chloride SA (K-DUR) 20 MEQ tablet, TAKE ONE TABLET BY MOUTH ONCE DAILY (AM), Disp: 30 tablet, Rfl: 6 .  predniSONE (DELTASONE) 10 MG tablet, Take 2 tabs x 5 days, Disp: 10 tablet, Rfl: 0 .  spironolactone (ALDACTONE) 25 MG tablet, TAKE ONE TABLET BY MOUTH ONCE DAILY (BEDTIME), Disp: 30 tablet, Rfl: 5  Current Facility-Administered Medications:  .  methylPREDNISolone acetate (DEPO-MEDROL) injection 40 mg, 40 mg, Intra-articular, Once, [name], [name], MD Allergies  Allergen Reactions  . Sulfa Antibiotics Nausea Only     Social History   Socioeconomic History  . Marital status: Single    Spouse name: Not on file  . Number of children: Not on  file  . Years of education: Not on file  . Highest education level: Not on file  Occupational History  . Not on file  Social Needs  . Financial resource strain: Not on file  . Food insecurity    Worry: Not on file    Inability: Not on file  . Transportation needs    Medical: Not on file    Non-medical: Not on file  Tobacco Use  . Smoking status: Current Every Day Smoker    Packs/day: 1.50    Years: 44.00    Pack years: 66.00    Types: Cigarettes    Start date: 76  . Smokeless tobacco: Never Used  . Tobacco comment: 1ppd as of 12/06/17  ep  Substance and Sexual Activity  . Alcohol use: No  . Drug use: No  . Sexual activity: Not on file  Lifestyle  . Physical activity    Days per week: Not on file    Minutes per session: Not on file  . Stress: Not on file  Relationships  . Social Herbalist on phone: Not on file    Gets together: Not on file    Attends religious service: Not on file    Active member of club or organization: Not on file    Attends meetings of clubs or organizations: Not on file    Relationship status: Not on file  . Intimate partner violence    Fear of current or ex partner: Not on file    Emotionally abused: Not on file    Physically abused: Not on file    Forced sexual activity: Not on file  Other Topics Concern  . Not on file  Social History Narrative  . Not on file    Physical Exam      Future Appointments  Date Time Provider [place]  04/19/2019  1:40 PM [name], MD [facility] None     BP 104/74 (BP Location: Left Arm, Patient Position: Standing, Cuff Size: Normal)   Pulse 95   Temp 99 F (37.2 C)   Wt 141 lb 4.8 oz (64.1 kg)   SpO2 97%   BMI 20.27 kg/m   Weight yesterday-didn't weigh Last visit weight-137  ATF pt CAO x4, just coming in from work.  He said that he feels good and the trelegy ellipta is working. He completed the prednisone prescription last week. He still has slight wheezing noted to his upper lung fields.  He used the inhaler during this visit.  He denies sob; SP02 @ 97%; no chest pain and dizziness. He said that he's been eating a lot lately; no edema noted. rx bottles verified and pill box refilled.    Medication ordered: bidil Bisoprolol plavix Atorvastatin  [name], EMT Paramedic [phone] 03/27/2019    ACTION: Home visit completed

## 2019-03-31 ENCOUNTER — Emergency Department (HOSPITAL_COMMUNITY)
Admission: EM | Admit: 2019-03-31 | Discharge: 2019-03-31 | Disposition: A | Discharge status: Home / Self Care | Payer: Medicare Other | Attending: Emergency Medicine | Admitting: Emergency Medicine

## 2019-03-31 ENCOUNTER — Emergency Department (HOSPITAL_COMMUNITY): Payer: Medicare Other

## 2019-03-31 ENCOUNTER — Other Ambulatory Visit: Payer: Self-pay

## 2019-03-31 ENCOUNTER — Encounter (HOSPITAL_COMMUNITY): Payer: Self-pay | Admitting: Emergency Medicine

## 2019-03-31 DIAGNOSIS — I130 Hypertensive heart and chronic kidney disease with heart failure and stage 1 through stage 4 chronic kidney disease, or unspecified chronic kidney disease: Secondary | ICD-10-CM | POA: Diagnosis not present

## 2019-03-31 DIAGNOSIS — R0602 Shortness of breath: Secondary | ICD-10-CM | POA: Diagnosis present

## 2019-03-31 DIAGNOSIS — Z20828 Contact with and (suspected) exposure to other viral communicable diseases: Secondary | ICD-10-CM | POA: Diagnosis not present

## 2019-03-31 DIAGNOSIS — Z7901 Long term (current) use of anticoagulants: Secondary | ICD-10-CM | POA: Diagnosis not present

## 2019-03-31 DIAGNOSIS — I5022 Chronic systolic (congestive) heart failure: Secondary | ICD-10-CM | POA: Insufficient documentation

## 2019-03-31 DIAGNOSIS — N183 Chronic kidney disease, stage 3 (moderate): Secondary | ICD-10-CM | POA: Diagnosis not present

## 2019-03-31 DIAGNOSIS — Z79899 Other long term (current) drug therapy: Secondary | ICD-10-CM | POA: Diagnosis not present

## 2019-03-31 DIAGNOSIS — F17210 Nicotine dependence, cigarettes, uncomplicated: Secondary | ICD-10-CM | POA: Insufficient documentation

## 2019-03-31 DIAGNOSIS — J441 Chronic obstructive pulmonary disease with (acute) exacerbation: Secondary | ICD-10-CM | POA: Insufficient documentation

## 2019-03-31 LAB — CBC WITH DIFFERENTIAL/PLATELET
Abs Immature Granulocytes: 0.24 10*3/uL — ABNORMAL HIGH (ref 0.00–0.07)
Basophils Absolute: 0.1 10*3/uL (ref 0.0–0.1)
Basophils Relative: 1 %
Eosinophils Absolute: 0.1 10*3/uL (ref 0.0–0.5)
Eosinophils Relative: 1 %
HCT: 40.4 % (ref 39.0–52.0)
Hemoglobin: 13.8 g/dL (ref 13.0–17.0)
Immature Granulocytes: 2 %
Lymphocytes Relative: 22 %
Lymphs Abs: 2.3 10*3/uL (ref 0.7–4.0)
MCH: 31.9 pg (ref 26.0–34.0)
MCHC: 34.2 g/dL (ref 30.0–36.0)
MCV: 93.3 fL (ref 80.0–100.0)
Monocytes Absolute: 1 10*3/uL (ref 0.1–1.0)
Monocytes Relative: 10 %
Neutro Abs: 6.7 10*3/uL (ref 1.7–7.7)
Neutrophils Relative %: 64 %
Platelets: 194 10*3/uL (ref 150–400)
RBC: 4.33 MIL/uL (ref 4.22–5.81)
RDW: 13 % (ref 11.5–15.5)
WBC: 10.4 10*3/uL (ref 4.0–10.5)
nRBC: 0 % (ref 0.0–0.2)

## 2019-03-31 LAB — COMPREHENSIVE METABOLIC PANEL
ALT: 23 U/L (ref 0–44)
AST: 30 U/L (ref 15–41)
Albumin: 4 g/dL (ref 3.5–5.0)
Alkaline Phosphatase: 69 U/L (ref 38–126)
Anion gap: 11 (ref 5–15)
BUN: 34 mg/dL — ABNORMAL HIGH (ref 8–23)
CO2: 25 mmol/L (ref 22–32)
Calcium: 9.6 mg/dL (ref 8.9–10.3)
Chloride: 100 mmol/L (ref 98–111)
Creatinine, Ser: 2.02 mg/dL — ABNORMAL HIGH (ref 0.61–1.24)
GFR calc Af Amer: 39 mL/min — ABNORMAL LOW (ref >60)
GFR calc non Af Amer: 33 mL/min — ABNORMAL LOW (ref >60)
Glucose, Bld: 96 mg/dL (ref 70–99)
Potassium: 5.1 mmol/L (ref 3.5–5.1)
Sodium: 136 mmol/L (ref 135–145)
Total Bilirubin: 0.9 mg/dL (ref 0.3–1.2)
Total Protein: 7.8 g/dL (ref 6.5–8.1)

## 2019-03-31 LAB — BRAIN NATRIURETIC PEPTIDE: B Natriuretic Peptide: 55.2 pg/mL (ref 0.0–100.0)

## 2019-03-31 LAB — TROPONIN I (HIGH SENSITIVITY)
Troponin I (High Sensitivity): 31 ng/L — ABNORMAL HIGH (ref <18)
Troponin I (High Sensitivity): 29 ng/L — ABNORMAL HIGH (ref <18)

## 2019-03-31 MED ORDER — DOXYCYCLINE HYCLATE 100 MG PO CAPS: 100.0000 mg | ORAL_CAPSULE | Freq: Two times a day (BID) | ORAL | 0 refills | Status: AC

## 2019-03-31 MED ORDER — AEROCHAMBER PLUS FLO-VU LARGE MISC
Status: AC
  Administered 2019-03-31: 1
  Filled 2019-03-31: qty 1

## 2019-03-31 MED ORDER — METHYLPREDNISOLONE SODIUM SUCC 125 MG IJ SOLR
125.0000 mg | Freq: Once | INTRAMUSCULAR | Status: AC
  Administered 2019-03-31: 125 mg via INTRAVENOUS
  Filled 2019-03-31: qty 2

## 2019-03-31 MED ORDER — PREDNISONE 10 MG PO TABS: 40.0000 mg | ORAL_TABLET | Freq: Every day | ORAL | 0 refills | Status: AC

## 2019-03-31 MED ORDER — ALBUTEROL SULFATE HFA 108 (90 BASE) MCG/ACT IN AERS
4.0000 | INHALATION_SPRAY | Freq: Once | RESPIRATORY_TRACT | Status: AC
  Administered 2019-03-31: 4 via RESPIRATORY_TRACT
  Filled 2019-03-31: qty 6.7

## 2019-03-31 MED ORDER — DOXYCYCLINE HYCLATE 100 MG PO TABS
100.0000 mg | ORAL_TABLET | Freq: Once | ORAL | Status: AC
  Administered 2019-03-31: 100 mg via ORAL
  Filled 2019-03-31: qty 1

## 2019-03-31 NOTE — ED Triage Notes (Signed)
Pt to ED with c/o shortness of breath.  Pt st's he lives in a rooming house and people there wanted him to come get checked out.  Pt st's he has COPD but recently has had a productive cough (green).

## 2019-03-31 NOTE — Discharge Instructions (Signed)
Take the steroids and antibiotics as directed. We will contact you if your COVID test is positive. Your results should be back within 24 hours. Return to the ED if you start to develop worsening symptoms, develop chest pain, shortness of breath, leg swelling, vomiting or coughing up blood.

## 2019-03-31 NOTE — ED Provider Notes (Signed)
Medical screening examination/treatment/procedure(s) were conducted as a shared visit with non-physician practitioner(s) and myself.  I personally evaluated the patient during the encounter.  Clinical Impression:   Final diagnoses:  COPD exacerbation [facility])    This patient is a 68 year old male with known COPD, has had some increasing coughing and phlegm as well as some nasal drainage, it was recommended by coworkers that he come to be evaluated for coronavirus.  On exam the patient does have diffuse symmetrical expiratory wheezing.  He is not in distress.  Will obtain sample for COVID, he does not need the rapid sample, he will receive albuterol inhaler, doxycycline and anticipate discharge, he is otherwise well-appearing   Noemi Chapel, MD 04/01/19 [phone]

## 2019-03-31 NOTE — ED Provider Notes (Signed)
[facility] EMERGENCY DEPARTMENT Provider Note   CSN: [id number] Arrival date & time: 03/31/19  1606     History   Chief Complaint Chief Complaint  Patient presents with   Shortness of Breath    HPI Gerald Nelson is a 68 y.o. male with a past medical history of stage III CKD, COPD, hypertension, CHF presents to ED with shortness of breath, cough and green nasal discharge.  States that his symptoms have been going on for the past 6 months.  He presented to the ED today because his manager at work told him to come here because "I was coughing and they told me I was breathing hard and I had green stuff coming out of my nose. They accused me of coughing."  States that his symptoms have not changed recently.  He denies any chest pain but does state that he has had wheezing which has improved with his home albuterol inhaler.  He denies any leg swelling and reports compliance with his home Lasix.  Denies any fever, sick contacts with similar symptoms, prior COVID-19 testing, known exposure to COVID-19, abdominal pain, nausea, vomiting, recent travel, history of DVT, PE.     HPI  Past Medical History:  Diagnosis Date   Apical mural thrombus    a. question of apical thrombus on echo 01/2017, pt left AMA, not felt to be anticoag candidate with noncompliance.   Arthritis    CHF (congestive heart failure) (HCC)    Chronic systolic CHF (congestive heart failure) ([place])    a. pt refused cath. EF 15% 01/2017.   CKD (chronic kidney disease), stage III (HCC)    COPD (chronic obstructive pulmonary disease) (HCC)    Hernia, hiatal    Hyperlipidemia    Hypertension    Hyponatremia    Mitral regurgitation    a. mod-severe by echo 01/2017.   Pulmonary hypertension ([place])    Schizophrenia ([place])    Tobacco abuse     Patient Active Problem List   Diagnosis Date Noted   Stage 3 severe COPD by GOLD classification ([place]) 11/14/2018   Noncompliance 04/23/2017   Mitral  regurgitation 04/23/2017   Thrombus - possible apical thrombus 01/2017 04/23/2017   Hyperlipidemia 04/23/2017   Pulmonary hypertension (HCC)    SOB (shortness of breath)    Palliative care by specialist    Tobacco abuse 04/19/2017   Acute respiratory failure with hypoxia ([place]) 01/27/2017   Hypertensive urgency 01/27/2017   CHF (congestive heart failure) ([place]) 01/26/2017   CKD (chronic kidney disease), stage III ([place]) 01/04/2017   Essential hypertension 01/04/2017   CVA (cerebral infarction) 12/12/2013   Hyponatremia 12/06/2012   Hiatal hernia 12/06/2012   Syncope 12/06/2012   Schizophrenia ([place])     Past Surgical History:  Procedure Laterality Date   PENILE PROSTHESIS IMPLANT          Home Medications    Prior to Admission medications   Medication Sig Start Date End Date Taking? Authorizing Provider  albuterol (PROVENTIL HFA;VENTOLIN HFA) 108 (90 Base) MCG/ACT inhaler Inhale 1-2 puffs into the lungs every 6 (six) hours as needed for wheezing or shortness of breath.    [provider]  aspirin 81 MG EC tablet TAKE 1 TABLET BY MOUTH EVERY DAY 06/14/17   Fay Records, MD  atorvastatin (LIPITOR) 40 MG tablet TAKE ONE TABLET BY MOUTH EVERY DAY IN THE EVENING 08/11/18   Shirley Friar, PA-C  bisoprolol (ZEBETA) 10 MG tablet TAKE 1 TABLET (10  MG TOTAL) BY MOUTH DAILY. 02/01/19   [name], MD  clopidogrel (PLAVIX) 75 MG tablet Take 75 mg by mouth daily.    [provider]  doxycycline (VIBRAMYCIN) 100 MG capsule Take 1 capsule (100 mg total) by mouth 2 (two) times daily for 7 days. 03/31/19 04/07/19  Malai Lady, PA-C  Fluticasone-Umeclidin-Vilant (TRELEGY ELLIPTA) 100-62.5-25 MCG/INH AEPB Inhale 1 puff into the lungs.    [provider]  Fluticasone-Umeclidin-Vilant (TRELEGY ELLIPTA) 100-62.5-25 MCG/INH AEPB Inhale 1 puff into the lungs daily. 03/21/19   Martyn Ehrich, NP  furosemide (LASIX) 40 MG tablet TAKE 1 TABLET (40 MG  TOTAL) BY MOUTH DAILY. IF WEIGHT GOES UP 2 TO 3 LBS IN A DAY OR 3 TO 4 LBS IN A WEEK, INCREASE TO 2 TABS IN AM AND 1 TAB IN THE PM 11/09/18   [name], MD  haloperidol decanoate (HALDOL DECANOATE) 100 MG/ML injection INJECT 1 ML EVERY 4 WEEKS 08/07/18   [provider]  isosorbide-hydrALAZINE (BIDIL) 20-37.5 MG tablet Take 1 tablet by mouth 3 (three) times daily. 01/16/19   [name], MD  losartan (COZAAR) 25 MG tablet Take 1 tablet (25 mg total) by mouth 2 (two) times daily. 10/18/18   [name], MD  Nicotine 21-14-7 MG/24HR KIT Place 1 patch onto the skin daily. 11/14/18   Martyn Ehrich, NP  potassium chloride SA (K-DUR) 20 MEQ tablet TAKE ONE TABLET BY MOUTH ONCE DAILY (AM) 03/21/19   [name], MD  predniSONE (DELTASONE) 10 MG tablet Take 4 tablets (40 mg total) by mouth daily for 4 days. 03/31/19 04/04/19  Kiarra Kidd, Nicanor Alcon, PA-C  spironolactone (ALDACTONE) 25 MG tablet TAKE ONE TABLET BY MOUTH ONCE DAILY (BEDTIME) 09/12/18   Tillery, Satira Mccallum, PA-C    Family History Family History  Problem Relation Age of Onset   Hypertension Mother    Hypertension Father     Social History Social History   Tobacco Use   Smoking status: Current Every Day Smoker    Packs/day: 1.50    Years: 44.00    Pack years: 66.00    Types: Cigarettes    Start date: 1974   Smokeless tobacco: Never Used   Tobacco comment: 1ppd as of 12/06/17 ep  Substance Use Topics   Alcohol use: No   Drug use: No     Allergies   Sulfa antibiotics   Review of Systems Review of Systems  Constitutional: Negative for appetite change, chills and fever.  HENT: Positive for rhinorrhea. Negative for ear pain, sneezing and sore throat.   Eyes: Negative for photophobia and visual disturbance.  Respiratory: Positive for cough and shortness of breath. Negative for chest tightness and wheezing.   Cardiovascular: Negative for chest pain and palpitations.  Gastrointestinal: Negative for  abdominal pain, blood in stool, constipation, diarrhea, nausea and vomiting.  Genitourinary: Negative for dysuria, hematuria and urgency.  Musculoskeletal: Negative for myalgias.  Skin: Negative for rash.  Neurological: Negative for dizziness, weakness and light-headedness.     Physical Exam Updated Vital Signs BP 120/85    Pulse 86    Temp 97.7 F (36.5 C) (Oral)    Resp 19    Ht '5\' 10"'$  (1.778 m)    Wt 64.4 kg    SpO2 96%    BMI 20.37 kg/m   Physical Exam Vitals signs and nursing note reviewed.  Constitutional:      General: He is not in acute distress.    Appearance: He  is well-developed.     Comments: Speaking in complete sentences without difficulty.  HENT:     Head: Normocephalic and atraumatic.     Nose: Nose normal.  Eyes:     General: No scleral icterus.       Left eye: No discharge.     Conjunctiva/sclera: Conjunctivae normal.  Neck:     Musculoskeletal: Normal range of motion and neck supple.  Cardiovascular:     Rate and Rhythm: Normal rate and regular rhythm.     Heart sounds: Normal heart sounds. No murmur. No friction rub. No gallop.   Pulmonary:     Effort: Pulmonary effort is normal. No respiratory distress.     Breath sounds: Examination of the right-middle field reveals wheezing. Examination of the left-middle field reveals wheezing. Examination of the right-lower field reveals wheezing. Examination of the left-lower field reveals wheezing. Wheezing present.  Abdominal:     General: Bowel sounds are normal. There is no distension.     Palpations: Abdomen is soft.     Tenderness: There is no abdominal tenderness. There is no guarding.  Musculoskeletal: Normal range of motion.  Skin:    General: Skin is warm and dry.     Findings: No rash.  Neurological:     Mental Status: He is alert.     Motor: No abnormal muscle tone.     Coordination: Coordination normal.      ED Treatments / Results  Labs (all labs ordered are listed, but only abnormal results  are displayed) Labs Reviewed  CBC WITH DIFFERENTIAL/PLATELET - Abnormal; Notable for the following components:      Result Value   Abs Immature Granulocytes 0.24 (*)    All other components within normal limits  COMPREHENSIVE METABOLIC PANEL - Abnormal; Notable for the following components:   BUN 34 (*)    Creatinine, Ser 2.02 (*)    GFR calc non Af Amer 33 (*)    GFR calc Af Amer 39 (*)    All other components within normal limits  TROPONIN I (HIGH SENSITIVITY) - Abnormal; Notable for the following components:   Troponin I (High Sensitivity) 31 (*)    All other components within normal limits  TROPONIN I (HIGH SENSITIVITY) - Abnormal; Notable for the following components:   Troponin I (High Sensitivity) 29 (*)    All other components within normal limits  NOVEL CORONAVIRUS, NAA (HOSP ORDER, SEND-OUT TO REF LAB; TAT 18-24 HRS)  BRAIN NATRIURETIC PEPTIDE    EKG EKG Interpretation  Date/Time:  Saturday March 31 2019 16:11:39 EDT Ventricular Rate:  87 PR Interval:  162 QRS Duration: 98 QT Interval:  376 QTC Calculation: 452 R Axis:   29 Text Interpretation:  Normal sinus rhythm Biatrial enlargement Left ventricular hypertrophy Cannot rule out Septal infarct , age undetermined Abnormal ECG since last tracing no significant change Confirmed by Noemi Chapel [phone]) on 03/31/2019 5:34:06 PM   Radiology Dg Chest 2 View  Result Date: 03/31/2019 CLINICAL DATA:  Shortness of breath EXAM: CHEST - 2 VIEW COMPARISON:  05/24/2018 FINDINGS: Hyperinflation with emphysematous disease. No acute airspace disease or effusion. Normal cardiomediastinal silhouette. No pneumothorax. IMPRESSION: No active cardiopulmonary disease. Hyperinflation with emphysematous disease. Electronically Signed   By: Donavan Foil M.D.   On: 03/31/2019 17:17    Procedures Procedures (including critical care time)  Medications Ordered in ED Medications  albuterol (VENTOLIN HFA) 108 (90 Base) MCG/ACT inhaler 4  puff (4 puffs Inhalation Given 03/31/19 1812)  methylPREDNISolone sodium succinate (  SOLU-MEDROL) 125 mg/2 mL injection 125 mg (125 mg Intravenous Given 03/31/19 1813)  AeroChamber Plus Flo-Vu Large MISC (1 each  Given 03/31/19 1847)  doxycycline (VIBRA-TABS) tablet 100 mg (100 mg Oral Given 03/31/19 1846)     Initial Impression / Assessment and Plan / ED Course  I have reviewed the triage vital signs and the nursing notes.  Pertinent labs & imaging results that were available during my care of the patient were reviewed by me and considered in my medical decision making (see chart for details).        68 year old male with a past medical history of COPD, CHF presents to ED for evaluation of shortness of breath, cough and rhinorrhea.  States his symptoms have been going on for the past several months but he was told while at his job today that he needs to be evaluated in the ED due to his coughing.  He denies any chest pain, leg swelling and states that he has been compliant with his medications and albuterol.  On my exam he is overall well-appearing.  He is not tachycardic, tachypneic or hypoxic.  He does have wheezing noted in bilateral lower lung fields without signs of respiratory distress.  No lower extremity edema, erythema or calf tenderness of concern me for DVT.  EKG shows normal sinus rhythm, no changes from prior tracings.  Chest x-ray shows chronic changes of hyperinflation.  Lab work including CBC, CMP, initial troponin unremarkable.  BNP is normal.  Delta troponin is lower than initial.  Patient was given albuterol, Solu-Medrol here with improvement in his symptoms.  Lungs exam improved.  He remains in no acute distress, hemodynamically stable.  He is comfortable with discharge home with antibiotics and steroids.  Will send out COVID swab per his request.  Suspect the symptoms due to COPD exacerbation.  We will have him return for worsening symptoms.   Patient is hemodynamically stable, in  NAD, and able to ambulate in the ED. Evaluation does not show pathology that would require ongoing emergent intervention or inpatient treatment. I explained the diagnosis to the patient. Pain has been managed and has no complaints prior to discharge. Patient is comfortable with above plan and is stable for discharge at this time. All questions were answered prior to disposition. Strict return precautions for returning to the ED were discussed. Encouraged follow up with PCP.   An After Visit Summary was printed and given to the patient.   Portions of this note were generated with [employer]. Dictation errors may occur despite best attempts at proofreading.   Final Clinical Impressions(s) / ED Diagnoses   Final diagnoses:  COPD exacerbation [facility])    ED Discharge Orders         Ordered    predniSONE (DELTASONE) 10 MG tablet  Daily     03/31/19 1914    doxycycline (VIBRAMYCIN) 100 MG capsule  2 times daily     03/31/19 1914           Delia Heady, PA-C 03/31/19 1916    Noemi Chapel, MD 04/01/19 [phone]

## 2019-03-31 NOTE — ED Notes (Signed)
Discharge instructions discussed with pt. Pt verbalized understanding. Pt stable and ambulatory. No signature pad available. 

## 2019-04-01 LAB — NOVEL CORONAVIRUS, NAA (HOSP ORDER, SEND-OUT TO REF LAB; TAT 18-24 HRS): SARS-CoV-2, NAA: NOT DETECTED

## 2019-04-03 ENCOUNTER — Other Ambulatory Visit (HOSPITAL_COMMUNITY): Payer: Self-pay

## 2019-04-03 NOTE — Progress Notes (Signed)
Paramedicine Encounter    Patient ID: Gerald Nelson, male    DOB: Sep 07, 1950, 68 y.o.   MRN: [id number]    Patient Care Team: [name], MD as PCP - General (Internal Medicine) [name], MD as PCP - Advanced Heart Failure (Cardiology) [name], LCSW as Social Worker (Licensed Clinical Social Worker)  Patient Active Problem List   Diagnosis Date Noted  . Stage 3 severe COPD by GOLD classification ([place]) 11/14/2018  . Noncompliance 04/23/2017  . Mitral regurgitation 04/23/2017  . Thrombus - possible apical thrombus 01/2017 04/23/2017  . Hyperlipidemia 04/23/2017  . Pulmonary hypertension ([place])   . SOB (shortness of breath)   . Palliative care by specialist   . Tobacco abuse 04/19/2017  . Acute respiratory failure with hypoxia ([place]) 01/27/2017  . Hypertensive urgency 01/27/2017  . CHF (congestive heart failure) ([place]) 01/26/2017  . CKD (chronic kidney disease), stage III ([place]) 01/04/2017  . Essential hypertension 01/04/2017  . CVA (cerebral infarction) 12/12/2013  . Hyponatremia 12/06/2012  . Hiatal hernia 12/06/2012  . Syncope 12/06/2012  . Schizophrenia ([place])     Current Outpatient Medications:  .  albuterol (PROVENTIL HFA;VENTOLIN HFA) 108 (90 Base) MCG/ACT inhaler, Inhale 1-2 puffs into the lungs every 6 (six) hours as needed for wheezing or shortness of breath., Disp: , Rfl:  .  aspirin 81 MG EC tablet, TAKE 1 TABLET BY MOUTH EVERY DAY, Disp: 30 tablet, Rfl: 1 .  atorvastatin (LIPITOR) 40 MG tablet, TAKE ONE TABLET BY MOUTH EVERY DAY IN THE EVENING, Disp: 30 tablet, Rfl: 6 .  bisoprolol (ZEBETA) 10 MG tablet, TAKE 1 TABLET (10 MG TOTAL) BY MOUTH DAILY., Disp: 90 tablet, Rfl: 1 .  clopidogrel (PLAVIX) 75 MG tablet, Take 75 mg by mouth daily., Disp: , Rfl:  .  doxycycline (VIBRAMYCIN) 100 MG capsule, Take 1 capsule (100 mg total) by mouth 2 (two) times daily for 7 days., Disp: 14 capsule, Rfl: 0 .  Fluticasone-Umeclidin-Vilant (TRELEGY ELLIPTA) 100-62.5-25  MCG/INH AEPB, Inhale 1 puff into the lungs., Disp: , Rfl:  .  Fluticasone-Umeclidin-Vilant (TRELEGY ELLIPTA) 100-62.5-25 MCG/INH AEPB, Inhale 1 puff into the lungs daily., Disp: 2 each, Rfl: 0 .  furosemide (LASIX) 40 MG tablet, TAKE 1 TABLET (40 MG TOTAL) BY MOUTH DAILY. IF WEIGHT GOES UP 2 TO 3 LBS IN A DAY OR 3 TO 4 LBS IN A WEEK, INCREASE TO 2 TABS IN AM AND 1 TAB IN THE PM, Disp: 60 tablet, Rfl: 6 .  haloperidol decanoate (HALDOL DECANOATE) 100 MG/ML injection, INJECT 1 ML EVERY 4 WEEKS, Disp: , Rfl:  .  isosorbide-hydrALAZINE (BIDIL) 20-37.5 MG tablet, Take 1 tablet by mouth 3 (three) times daily., Disp: 180 tablet, Rfl: 6 .  losartan (COZAAR) 25 MG tablet, Take 1 tablet (25 mg total) by mouth 2 (two) times daily., Disp: 180 tablet, Rfl: 3 .  Nicotine 21-14-7 MG/24HR KIT, Place 1 patch onto the skin daily., Disp: 56 each, Rfl: 1 .  potassium chloride SA (K-DUR) 20 MEQ tablet, TAKE ONE TABLET BY MOUTH ONCE DAILY (AM), Disp: 30 tablet, Rfl: 6 .  predniSONE (DELTASONE) 10 MG tablet, Take 4 tablets (40 mg total) by mouth daily for 4 days., Disp: 16 tablet, Rfl: 0 .  spironolactone (ALDACTONE) 25 MG tablet, TAKE ONE TABLET BY MOUTH ONCE DAILY (BEDTIME), Disp: 30 tablet, Rfl: 5  Current Facility-Administered Medications:  .  methylPREDNISolone acetate (DEPO-MEDROL) injection 40 mg, 40 mg, Intra-articular, Once, [name], [name], MD Allergies  Allergen Reactions  . Sulfa  Antibiotics Nausea Only     Social History   Socioeconomic History  . Marital status: Single    Spouse name: Not on file  . Number of children: Not on file  . Years of education: Not on file  . Highest education level: Not on file  Occupational History  . Not on file  Social Needs  . Financial resource strain: Not on file  . Food insecurity    Worry: Not on file    Inability: Not on file  . Transportation needs    Medical: Not on file    Non-medical: Not on file  Tobacco Use  . Smoking status: Current Every Day  Smoker    Packs/day: 1.50    Years: 44.00    Pack years: 66.00    Types: Cigarettes    Start date: 62  . Smokeless tobacco: Never Used  . Tobacco comment: 1ppd as of 12/06/17 ep  Substance and Sexual Activity  . Alcohol use: No  . Drug use: No  . Sexual activity: Not on file  Lifestyle  . Physical activity    Days per week: Not on file    Minutes per session: Not on file  . Stress: Not on file  Relationships  . Social Herbalist on phone: Not on file    Gets together: Not on file    Attends religious service: Not on file    Active member of club or organization: Not on file    Attends meetings of clubs or organizations: Not on file    Relationship status: Not on file  . Intimate partner violence    Fear of current or ex partner: Not on file    Emotionally abused: Not on file    Physically abused: Not on file    Forced sexual activity: Not on file  Other Topics Concern  . Not on file  Social History Narrative  . Not on file    Physical Exam Pulmonary:     Breath sounds: Wheezing present. No rales.  Abdominal:     General: Abdomen is flat. There is no distension.     Palpations: Abdomen is soft.  Skin:    General: Skin is warm and dry.         Future Appointments  Date Time Provider [place]  04/19/2019  1:40 PM [name], MD [facility] None     BP 110/70 (BP Location: Right Arm, Patient Position: Standing, Cuff Size: Normal)   Pulse 77   Temp 98.7 F (37.1 C)   Wt 143 lb 9.6 oz (65.1 kg)   SpO2 97%   BMI 20.60 kg/m   Weight yesterday-didn't weigh Last visit weight-141  ATF pt CAO x4 standing outside on the porch. He said that he went to the ED over the weekend for cough (COPD).  He was given a rx for prednisone and doxycycline. He said that he's waiting for the pharmacy to deliver his meds.  He's wheezing in all lung fields, I advised him to use his rescue inhaler which he did. He denies chest pain and dizziness. He hasn't had  any increase of sob while at work.  He's taken his medications this morning.  rx bottles verified and pill box refilled. He's missing several medications. [facility] usually delivers his medications but hasn't since the pandemic.  rx bottles verified and pill box refilled.      Medication ordered: bidil Bisoprolol Clopidogrel  Alexa Blish, EMT Paramedic [phone] 04/03/2019  ACTION: Home visit completed

## 2019-04-18 ENCOUNTER — Other Ambulatory Visit (HOSPITAL_COMMUNITY): Payer: Self-pay

## 2019-04-18 NOTE — Progress Notes (Signed)
Paramedicine Encounter    Patient ID: Gerald Nelson, male    DOB: 1951-03-09, 68 y.o.   MRN: [id number]    Patient Care Team: [name], MD as PCP - General (Internal Medicine) [name], MD as PCP - Advanced Heart Failure (Cardiology) [name], LCSW as Social Worker (Licensed Clinical Social Worker)  Patient Active Problem List   Diagnosis Date Noted  . Stage 3 severe COPD by GOLD classification ([place]) 11/14/2018  . Noncompliance 04/23/2017  . Mitral regurgitation 04/23/2017  . Thrombus - possible apical thrombus 01/2017 04/23/2017  . Hyperlipidemia 04/23/2017  . Pulmonary hypertension ([place])   . SOB (shortness of breath)   . Palliative care by specialist   . Tobacco abuse 04/19/2017  . Acute respiratory failure with hypoxia ([place]) 01/27/2017  . Hypertensive urgency 01/27/2017  . CHF (congestive heart failure) ([place]) 01/26/2017  . CKD (chronic kidney disease), stage III 01/04/2017  . Essential hypertension 01/04/2017  . CVA (cerebral infarction) 12/12/2013  . Hyponatremia 12/06/2012  . Hiatal hernia 12/06/2012  . Syncope 12/06/2012  . Schizophrenia ([place])     Current Outpatient Medications:  .  albuterol (PROVENTIL HFA;VENTOLIN HFA) 108 (90 Base) MCG/ACT inhaler, Inhale 1-2 puffs into the lungs every 6 (six) hours as needed for wheezing or shortness of breath., Disp: , Rfl:  .  aspirin 81 MG EC tablet, TAKE 1 TABLET BY MOUTH EVERY DAY, Disp: 30 tablet, Rfl: 1 .  atorvastatin (LIPITOR) 40 MG tablet, TAKE ONE TABLET BY MOUTH EVERY DAY IN THE EVENING, Disp: 30 tablet, Rfl: 6 .  bisoprolol (ZEBETA) 10 MG tablet, TAKE 1 TABLET (10 MG TOTAL) BY MOUTH DAILY., Disp: 90 tablet, Rfl: 1 .  clopidogrel (PLAVIX) 75 MG tablet, Take 75 mg by mouth daily., Disp: , Rfl:  .  Fluticasone-Umeclidin-Vilant (TRELEGY ELLIPTA) 100-62.5-25 MCG/INH AEPB, Inhale 1 puff into the lungs., Disp: , Rfl:  .  Fluticasone-Umeclidin-Vilant (TRELEGY ELLIPTA) 100-62.5-25 MCG/INH AEPB, Inhale 1 puff  into the lungs daily., Disp: 2 each, Rfl: 0 .  furosemide (LASIX) 40 MG tablet, TAKE 1 TABLET (40 MG TOTAL) BY MOUTH DAILY. IF WEIGHT GOES UP 2 TO 3 LBS IN A DAY OR 3 TO 4 LBS IN A WEEK, INCREASE TO 2 TABS IN AM AND 1 TAB IN THE PM, Disp: 60 tablet, Rfl: 6 .  haloperidol decanoate (HALDOL DECANOATE) 100 MG/ML injection, INJECT 1 ML EVERY 4 WEEKS, Disp: , Rfl:  .  isosorbide-hydrALAZINE (BIDIL) 20-37.5 MG tablet, Take 1 tablet by mouth 3 (three) times daily., Disp: 180 tablet, Rfl: 6 .  losartan (COZAAR) 25 MG tablet, Take 1 tablet (25 mg total) by mouth 2 (two) times daily., Disp: 180 tablet, Rfl: 3 .  Nicotine 21-14-7 MG/24HR KIT, Place 1 patch onto the skin daily., Disp: 56 each, Rfl: 1 .  potassium chloride SA (K-DUR) 20 MEQ tablet, TAKE ONE TABLET BY MOUTH ONCE DAILY (AM), Disp: 30 tablet, Rfl: 6 .  spironolactone (ALDACTONE) 25 MG tablet, TAKE ONE TABLET BY MOUTH ONCE DAILY (BEDTIME), Disp: 30 tablet, Rfl: 5  Current Facility-Administered Medications:  .  methylPREDNISolone acetate (DEPO-MEDROL) injection 40 mg, 40 mg, Intra-articular, Once, [name], [name], MD Allergies  Allergen Reactions  . Sulfa Antibiotics Nausea Only     Social History   Socioeconomic History  . Marital status: Single    Spouse name: Not on file  . Number of children: Not on file  . Years of education: Not on file  . Highest education level: Not on file  Occupational  History  . Not on file  Social Needs  . Financial resource strain: Not on file  . Food insecurity    Worry: Not on file    Inability: Not on file  . Transportation needs    Medical: Not on file    Non-medical: Not on file  Tobacco Use  . Smoking status: Current Every Day Smoker    Packs/day: 1.50    Years: 44.00    Pack years: 66.00    Types: Cigarettes    Start date: 75  . Smokeless tobacco: Never Used  . Tobacco comment: 1ppd as of 12/06/17 ep  Substance and Sexual Activity  . Alcohol use: No  . Drug use: No  . Sexual activity:  Not on file  Lifestyle  . Physical activity    Days per week: Not on file    Minutes per session: Not on file  . Stress: Not on file  Relationships  . Social Herbalist on phone: Not on file    Gets together: Not on file    Attends religious service: Not on file    Active member of club or organization: Not on file    Attends meetings of clubs or organizations: Not on file    Relationship status: Not on file  . Intimate partner violence    Fear of current or ex partner: Not on file    Emotionally abused: Not on file    Physically abused: Not on file    Forced sexual activity: Not on file  Other Topics Concern  . Not on file  Social History Narrative  . Not on file    Physical Exam Pulmonary:     Effort: No respiratory distress.     Breath sounds: No wheezing.  Abdominal:     General: There is no distension.     Tenderness: There is no abdominal tenderness. There is no guarding.  Musculoskeletal:        General: No swelling.     Right lower leg: No edema.     Left lower leg: No edema.  Skin:    General: Skin is warm and dry.         Future Appointments  Date Time Provider [place]  04/19/2019  1:40 PM [name], MD [facility] None     BP 108/70 (BP Location: Left Arm, Patient Position: Sitting, Cuff Size: Normal)   Pulse 72   Temp 97.8 F (36.6 C)   Wt 143 lb (64.9 kg)   SpO2 98%   BMI 20.52 kg/m   Weight yesterday-143 Last visit weight-143  ATF pt CAO x4 sitting on the porch talking with roommate. He stated that he's breathing better since he began trelegy. pt's lung sounds are clear and sound a lot better.  He denies sob, chest pain and dizziness.  He said that he's able to work without dificulty breathing or chest pain.  He's taken all of his medications for this week, besides this morning.  Mr. Hicklin is aware of his upcoming appointment tomorrow.  I advised him to take his meds before his appointment.  Also for him to bring all  of his medications and pill box to his appointment.  rx bottles verified and pill box refilled.    Medication ordered: Potassium Spirolactone  [place], EMT Paramedic [phone] 04/18/2019    ACTION: Home visit completed

## 2019-04-19 ENCOUNTER — Other Ambulatory Visit: Payer: Self-pay

## 2019-04-19 ENCOUNTER — Ambulatory Visit (HOSPITAL_COMMUNITY)
Admission: RE | Admit: 2019-04-19 | Discharge: 2019-04-19 | Disposition: A | Discharge status: Home / Self Care | Payer: Medicare Other | Source: Ambulatory Visit | Attending: Cardiology | Admitting: Cardiology

## 2019-04-19 ENCOUNTER — Other Ambulatory Visit (HOSPITAL_COMMUNITY): Payer: Self-pay

## 2019-04-19 ENCOUNTER — Encounter (HOSPITAL_COMMUNITY): Payer: Self-pay | Admitting: Cardiology

## 2019-04-19 VITALS — BP 105/68 | HR 73 | Wt 142.8 lb

## 2019-04-19 DIAGNOSIS — N183 Chronic kidney disease, stage 3 unspecified: Secondary | ICD-10-CM | POA: Diagnosis not present

## 2019-04-19 DIAGNOSIS — I340 Nonrheumatic mitral (valve) insufficiency: Secondary | ICD-10-CM | POA: Diagnosis not present

## 2019-04-19 DIAGNOSIS — F209 Schizophrenia, unspecified: Secondary | ICD-10-CM | POA: Insufficient documentation

## 2019-04-19 DIAGNOSIS — I272 Pulmonary hypertension, unspecified: Secondary | ICD-10-CM | POA: Insufficient documentation

## 2019-04-19 DIAGNOSIS — Z79899 Other long term (current) drug therapy: Secondary | ICD-10-CM | POA: Diagnosis not present

## 2019-04-19 DIAGNOSIS — Z8249 Family history of ischemic heart disease and other diseases of the circulatory system: Secondary | ICD-10-CM | POA: Diagnosis not present

## 2019-04-19 DIAGNOSIS — F17210 Nicotine dependence, cigarettes, uncomplicated: Secondary | ICD-10-CM | POA: Diagnosis not present

## 2019-04-19 DIAGNOSIS — I5022 Chronic systolic (congestive) heart failure: Secondary | ICD-10-CM | POA: Insufficient documentation

## 2019-04-19 DIAGNOSIS — Z8673 Personal history of transient ischemic attack (TIA), and cerebral infarction without residual deficits: Secondary | ICD-10-CM | POA: Insufficient documentation

## 2019-04-19 DIAGNOSIS — Z7951 Long term (current) use of inhaled steroids: Secondary | ICD-10-CM | POA: Diagnosis not present

## 2019-04-19 DIAGNOSIS — I2510 Atherosclerotic heart disease of native coronary artery without angina pectoris: Secondary | ICD-10-CM | POA: Diagnosis not present

## 2019-04-19 DIAGNOSIS — Z882 Allergy status to sulfonamides status: Secondary | ICD-10-CM | POA: Diagnosis not present

## 2019-04-19 DIAGNOSIS — Z7982 Long term (current) use of aspirin: Secondary | ICD-10-CM | POA: Diagnosis not present

## 2019-04-19 DIAGNOSIS — I130 Hypertensive heart and chronic kidney disease with heart failure and stage 1 through stage 4 chronic kidney disease, or unspecified chronic kidney disease: Secondary | ICD-10-CM | POA: Diagnosis not present

## 2019-04-19 DIAGNOSIS — J449 Chronic obstructive pulmonary disease, unspecified: Secondary | ICD-10-CM | POA: Insufficient documentation

## 2019-04-19 LAB — LIPID PANEL
Cholesterol: 144 mg/dL (ref 0–200)
HDL: 71 mg/dL (ref >40)
LDL Cholesterol: 67 mg/dL (ref 0–99)
Total CHOL/HDL Ratio: 2 RATIO
Triglycerides: 30 mg/dL (ref <150)
VLDL: 6 mg/dL (ref 0–40)

## 2019-04-19 LAB — BASIC METABOLIC PANEL
Anion gap: 9 (ref 5–15)
BUN: 32 mg/dL — ABNORMAL HIGH (ref 8–23)
CO2: 22 mmol/L (ref 22–32)
Calcium: 9.2 mg/dL (ref 8.9–10.3)
Chloride: 105 mmol/L (ref 98–111)
Creatinine, Ser: 1.92 mg/dL — ABNORMAL HIGH (ref 0.61–1.24)
GFR calc Af Amer: 41 mL/min — ABNORMAL LOW (ref >60)
GFR calc non Af Amer: 35 mL/min — ABNORMAL LOW (ref >60)
Glucose, Bld: 91 mg/dL (ref 70–99)
Potassium: 4.8 mmol/L (ref 3.5–5.1)
Sodium: 136 mmol/L (ref 135–145)

## 2019-04-19 MED ORDER — FUROSEMIDE 40 MG PO TABS: 20.0000 mg | ORAL_TABLET | Freq: Every day | ORAL | 6 refills | Status: DC

## 2019-04-19 NOTE — Progress Notes (Signed)
Paramedicine Encounter   Patient ID: Gerald Nelson , male,   DOB: 06-26-1951,68 y.o.,  MRN: [id number]   Met patient in clinic today with provider [facility].   Pt stated that he's taken his medications before this visit.  He denies sob, chest pain and dizziness.  Dr. Aundra Dubin talked to him about his heart function and smoking. He encouraged Gerald Nelson to start back using the nicotine patches.  Alvie said that he'll try.  I revised his pill box during this visit.    *Med changes during this visit  Decrease lasix to 20 mg daily  Time spent with patient 30 mins   [place], [place] 04/19/2019   ACTION: Home visit completed

## 2019-04-19 NOTE — Patient Instructions (Signed)
DECREASE Lasix to 20mg  (1 tab) daily  Labs today We will only contact you if something comes back abnormal or we need to make some changes. Otherwise no news is good news!  Your physician recommends that you schedule a follow-up appointment in: 4 months with Dr Aundra Dubin  At the [facility], you and your health needs are our priority. As part of our continuing mission to provide you with exceptional heart care, we have created designated Provider Care Teams. These Care Teams include your primary Cardiologist (physician) and Advanced Practice Providers (APPs- Physician Assistants and Nurse Practitioners) who all work together to provide you with the care you need, when you need it.   You may see any of the following providers on your designated Care Team at your next follow up: Marland Kitchen Dr Glori Bickers . Dr Loralie Champagne . Darrick Grinder, NP . Lyda Jester, PA   Please be sure to bring in all your medications bottles to every appointment.

## 2019-04-20 NOTE — Progress Notes (Signed)
Date:  04/20/2019   ID:  Gerald Nelson, DOB 07-May-1951, MRN [id number]   Provider location: [facility] Type of Visit: Established patient   PCP:  [name], MD  Cardiologist:  Dr. Aundra Dubin  Chief Complaint: Shortness of breath   History of Present Illness: Gerald Nelson is a 68 y.o. male who has a past medical history of chronic systolic CHF (EF 0000000 in July 2018), severe MR/TR, pulmonary HTN, CVA, CKD, schizophrenia, HTN and tobacco abuse.   Gerald Nelson was admitted to [facility] 7/2-01/06/17 for newly diagnosed acute systolic CHF. Echo showed LVEF 15%, severe global hypokinesis, moderate LVH, coarsetrabeculation of the LV apex with numerous false tendinae of the left ventricle, very stagnant blood flow at the LV apex withsmoke but no obvious LV thrombus - Definity contrast was given,again, noting stagnant apical blood flow - this could suggestrecent thrombus and certainly high risk for apical thrombusformation. Other findings include aortic sclerosis with mild AI,moderate to severe MR, moderate LAE, mild RAE, moderate to severeTR, moderate to severe pulmonary hypertension (RVSP 73 mmHg), dilated IVC, trivial posterior pericardial effusion.   He was admitted again in 10/18. He diuresed 25 pounds on IV lasix. He refused cath. His clonidine and diltazem were stopped during this admission. Referred to paramedicine. Discharge weight 133 pounds.   Echo 08/11/17 LVEF 15-20%, no MR noted.   Echo in 7/20 showed improvement in EF to 50-55%, moderate LVH, normal RV size and systolic function.   He presents today for followup of CHF.  He is still smoking, trying to quit with nicotine patch.  He is working in [employer] at [employer] now.  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  He does a lot of walking without dyspnea.  No lightheadedness.  No chest pain.  He has been taking Trelegy for COPD and feels like it helps.     Labs (6/19): K 3.8, creatinine 2.2 Labs  (11/19): K 4.1, creatinine 1.95, LDL 54, HDl 54 Labs (12/19): K 4.6, creatinine 1.89 LabS (2/20): K 4.5, creatinine 1.68  Labs (4/20): K 4.5, creatinine 1.58 Labs (9/20): K 5.1, creatinine 2.02  PMH: 1. Chronic systolic CHF:  Cardiomyopathy of uncertain etiology, refused cath. He also refused ICD.  - Echo (7/18): Moderate LVH, severe FBSH, trabeculation at apex not meeting criteria for noncompaction, EF 15%, moderate to severe MR, low normal RV systolic function, moderate-severe TR.  - Echo (2/19): Moderate LVH, EF 15-20%, No mitral regurgitation, normal RV size and systolic function, mild TR.  - Echo (7/20): EF 50-55%, moderate LVH, normal RV size and systolic function.  2. H/o CVA 3. Schizophrenia 4. CKD stage 3 5. Mitral regurgitation: Suspect functional. Moderate-severe MR on 7/18 echo, minimal MR on 2/19 echo.  6. COPD: He is an active smoker.  - PFTs (12/18) suggestive of severe COPD.  7. H/o HTN 8. ? apical mural thrombus: Not seen on most recent echo in 2/19.  9. CAD: 1/19 CT chest showed coronary calcification.   Current Outpatient Medications  Medication Sig Dispense Refill  . albuterol (PROVENTIL HFA;VENTOLIN HFA) 108 (90 Base) MCG/ACT inhaler Inhale 1-2 puffs into the lungs every 6 (six) hours as needed for wheezing or shortness of breath.    Marland Kitchen aspirin 81 MG EC tablet TAKE 1 TABLET BY MOUTH EVERY DAY 30 tablet 1  . atorvastatin (LIPITOR) 40 MG tablet TAKE ONE TABLET BY MOUTH EVERY DAY IN THE EVENING 30 tablet 6  . bisoprolol (ZEBETA) 10  MG tablet TAKE 1 TABLET (10 MG TOTAL) BY MOUTH DAILY. 90 tablet 1  . Fluticasone-Umeclidin-Vilant (TRELEGY ELLIPTA) 100-62.5-25 MCG/INH AEPB Inhale 1 puff into the lungs daily. 2 each 0  . furosemide (LASIX) 40 MG tablet Take 0.5 tablets (20 mg total) by mouth daily. 30 tablet 6  . haloperidol decanoate (HALDOL DECANOATE) 100 MG/ML injection INJECT 1 ML EVERY 4 WEEKS    . isosorbide-hydrALAZINE (BIDIL) 20-37.5 MG tablet Take 1 tablet by  mouth 3 (three) times daily. 180 tablet 6  . losartan (COZAAR) 25 MG tablet Take 1 tablet (25 mg total) by mouth 2 (two) times daily. 180 tablet 3  . potassium chloride SA (K-DUR) 20 MEQ tablet TAKE ONE TABLET BY MOUTH ONCE DAILY (AM) 30 tablet 6  . spironolactone (ALDACTONE) 25 MG tablet TAKE ONE TABLET BY MOUTH ONCE DAILY (BEDTIME) 30 tablet 5   Current Facility-Administered Medications  Medication Dose Route Frequency Provider Last Rate Last Dose  . methylPREDNISolone acetate (DEPO-MEDROL) injection 40 mg  40 mg Intra-articular Once [name], [name], MD        Allergies:   Sulfa antibiotics   Social History:  The patient  reports that he has been smoking cigarettes. He started smoking about 46 years ago. He has a 66.00 pack-year smoking history. He has never used smokeless tobacco. He reports that he does not drink alcohol or use drugs.   Family History:  The patient's family history includes Hypertension in his father and mother.   ROS:  Please see the history of present illness.   All other systems are personally reviewed and negative.   Exam:   BP 105/68   Pulse 73   Wt 64.8 kg (142 lb 12.8 oz)   SpO2 94%   BMI 20.49 kg/m  General: NAD Neck: No JVD, no thyromegaly or thyroid nodule.  Lungs: Clear to auscultation bilaterally with normal respiratory effort. CV: Nondisplaced PMI.  Heart regular S1/S2, no S3/S4, no murmur.  No peripheral edema.  No carotid bruit.  Normal pedal pulses.  Abdomen: Soft, nontender, no hepatosplenomegaly, no distention.  Skin: Intact without lesions or rashes.  Neurologic: Alert and oriented x 3.  Psych: Normal affect. Extremities: No clubbing or cyanosis.  HEENT: Normal.   Recent Labs: 03/31/2019: ALT 23; B Natriuretic Peptide 55.2; Hemoglobin 13.8; Platelets 194 04/19/2019: BUN 32; Creatinine, Ser 1.92; Potassium 4.8; Sodium 136  Personally reviewed   Wt Readings from Last 3 Encounters:  04/19/19 64.8 kg (142 lb 12.8 oz)  04/18/19 64.9 kg (143  lb)  04/03/19 65.1 kg (143 lb 9.6 oz)      ASSESSMENT AND PLAN:  1. Chronic systolic CHF: Echo 99991111 EF 15% with moderate-severe MR. Echo 08/11/17 LVEF 15-20%, no MR noted.  Cardiomyopathy of uncertain etiology, he has refused cath in the past.  Echo in 7/20 showed improvement in EF to 50-55%.  NYHA class II symptoms.  Weight and symptoms stable.  Creatinine up to 2 last check.  BP stable.  He looks euvolemic.  - He has been reluctant to have heart cath, will hold off for now with improvement in EF.  - With rise in creatinine and appearance of euvolemia, I will decrease Lasix to 20 mg daily.  BMET today.  - Continue bisoprolol 10 mg daily.   - Continue losartan 25 mg bid.  - Continue spironolactone 25 mg daily.  - Continue Bidil 1 tab tid.  - He is out of ICD range now, refused ICD in past.   2. Questionable apical  thrombus: Not seen on 7/20 echo. He is not anticoagulated.  3. Mitral regurgitation: Moderate to severe on 7/18 echo but trivial on 7/20 echo.  Suspect functional MR, improved with improved LV function.  4. COPD: He continues to smoke.  Severe COPD by prior PFTs.  - I again encouraged him to quit, he has started nicotine patches and is working on it.   5. CKD stage III: BMET today.   6. Schizophrenia: Affects compliance with medical care.  Continue paramedicine, this seems to be working well.  7. CAD: Noted on CT chest.    - Continue ASA 81.  - Continue atorvastatin, check lipids today.   - As above, recommended cath in past for diagnosis of CAD but he refused.  Think reasonable to hold off now with improved EF and no chest pain.    Followup in 4 months.   Signed, Loralie Champagne, MD  04/20/2019  [facility] [address] [facility] [place] [address] [phone] (office) [phone] (fax)

## 2019-04-24 ENCOUNTER — Other Ambulatory Visit (HOSPITAL_COMMUNITY): Payer: Self-pay

## 2019-04-24 NOTE — Progress Notes (Signed)
Paramedicine Encounter    Patient ID: Gerald Nelson, male    DOB: 01/17/1951, 68 y.o.   MRN: [id number]    Patient Care Team: [name], MD as PCP - General (Internal Medicine) [name], MD as PCP - Advanced Heart Failure (Cardiology) [name], LCSW as Social Worker (Licensed Clinical Social Worker)  Patient Active Problem List   Diagnosis Date Noted  . Stage 3 severe COPD by GOLD classification ([place]) 11/14/2018  . Noncompliance 04/23/2017  . Mitral regurgitation 04/23/2017  . Thrombus - possible apical thrombus 01/2017 04/23/2017  . Hyperlipidemia 04/23/2017  . Pulmonary hypertension ([place])   . SOB (shortness of breath)   . Palliative care by specialist   . Tobacco abuse 04/19/2017  . Acute respiratory failure with hypoxia ([place]) 01/27/2017  . Hypertensive urgency 01/27/2017  . CHF (congestive heart failure) ([place]) 01/26/2017  . CKD (chronic kidney disease), stage III 01/04/2017  . Essential hypertension 01/04/2017  . CVA (cerebral infarction) 12/12/2013  . Hyponatremia 12/06/2012  . Hiatal hernia 12/06/2012  . Syncope 12/06/2012  . Schizophrenia ([place])     Current Outpatient Medications:  .  atorvastatin (LIPITOR) 40 MG tablet, TAKE ONE TABLET BY MOUTH EVERY DAY IN THE EVENING, Disp: 30 tablet, Rfl: 6 .  bisoprolol (ZEBETA) 10 MG tablet, TAKE 1 TABLET (10 MG TOTAL) BY MOUTH DAILY., Disp: 90 tablet, Rfl: 1 .  furosemide (LASIX) 40 MG tablet, Take 0.5 tablets (20 mg total) by mouth daily., Disp: 30 tablet, Rfl: 6 .  haloperidol decanoate (HALDOL DECANOATE) 100 MG/ML injection, INJECT 1 ML EVERY 4 WEEKS, Disp: , Rfl:  .  isosorbide-hydrALAZINE (BIDIL) 20-37.5 MG tablet, Take 1 tablet by mouth 3 (three) times daily., Disp: 180 tablet, Rfl: 6 .  losartan (COZAAR) 25 MG tablet, Take 1 tablet (25 mg total) by mouth 2 (two) times daily., Disp: 180 tablet, Rfl: 3 .  albuterol (PROVENTIL HFA;VENTOLIN HFA) 108 (90 Base) MCG/ACT inhaler, Inhale 1-2 puffs into the lungs  every 6 (six) hours as needed for wheezing or shortness of breath., Disp: , Rfl:  .  aspirin 81 MG EC tablet, TAKE 1 TABLET BY MOUTH EVERY DAY, Disp: 30 tablet, Rfl: 1 .  Fluticasone-Umeclidin-Vilant (TRELEGY ELLIPTA) 100-62.5-25 MCG/INH AEPB, Inhale 1 puff into the lungs daily., Disp: 2 each, Rfl: 0 .  potassium chloride SA (K-DUR) 20 MEQ tablet, TAKE ONE TABLET BY MOUTH ONCE DAILY (AM), Disp: 30 tablet, Rfl: 6 .  spironolactone (ALDACTONE) 25 MG tablet, TAKE ONE TABLET BY MOUTH ONCE DAILY (BEDTIME), Disp: 30 tablet, Rfl: 5  Current Facility-Administered Medications:  .  methylPREDNISolone acetate (DEPO-MEDROL) injection 40 mg, 40 mg, Intra-articular, Once, [name], [name], MD Allergies  Allergen Reactions  . Sulfa Antibiotics Nausea Only     Social History   Socioeconomic History  . Marital status: Single    Spouse name: Not on file  . Number of children: Not on file  . Years of education: Not on file  . Highest education level: Not on file  Occupational History  . Not on file  Social Needs  . Financial resource strain: Not on file  . Food insecurity    Worry: Not on file    Inability: Not on file  . Transportation needs    Medical: Not on file    Non-medical: Not on file  Tobacco Use  . Smoking status: Current Every Day Smoker    Packs/day: 1.50    Years: 44.00    Pack years: 66.00    Types:  Cigarettes    Start date: 52  . Smokeless tobacco: Never Used  . Tobacco comment: 1ppd as of 12/06/17 ep  Substance and Sexual Activity  . Alcohol use: No  . Drug use: No  . Sexual activity: Not on file  Lifestyle  . Physical activity    Days per week: Not on file    Minutes per session: Not on file  . Stress: Not on file  Relationships  . Social Herbalist on phone: Not on file    Gets together: Not on file    Attends religious service: Not on file    Active member of club or organization: Not on file    Attends meetings of clubs or organizations: Not on file     Relationship status: Not on file  . Intimate partner violence    Fear of current or ex partner: Not on file    Emotionally abused: Not on file    Physically abused: Not on file    Forced sexual activity: Not on file  Other Topics Concern  . Not on file  Social History Narrative  . Not on file    Physical Exam Pulmonary:     Effort: No respiratory distress.     Breath sounds: No wheezing.  Abdominal:     General: Abdomen is flat. There is no distension.  Musculoskeletal:     Right lower leg: No edema.     Left lower leg: No edema.  Skin:    General: Skin is warm and dry.         Future Appointments  Date Time Provider [place]  08/24/2019  1:40 PM [name], MD [facility] None     BP 140/80   Pulse 73   Temp 99.1 F (37.3 C)   Wt 145 lb 12.8 oz (66.1 kg)   BMI 20.92 kg/m   Weight yesterday-145 Last visit weight-142 (clinic visit)  ATF pt CAO x4 sitting on the porch smoking a cigarette.  He denies sob, chest pain and dizziness.  He has taken all of his medications for this week and today.  He also denies needing to add additional pillows to sleep.  rx bottles verified and pill box refilled.  He said that he's moving today; to [name]     Medication ordered: None  Ilham Roughton, EMT Paramedic [phone] 04/24/2019    ACTION: Home visit completed

## 2019-05-02 ENCOUNTER — Other Ambulatory Visit (HOSPITAL_COMMUNITY): Payer: Self-pay

## 2019-05-02 NOTE — Progress Notes (Signed)
Paramedicine Encounter    Patient ID: Gerald Nelson, male    DOB: 23-Oct-1950, 68 y.o.   MRN: [id number]    Patient Care Team: [name], MD as PCP - General (Internal Medicine) [name], MD as PCP - Advanced Heart Failure (Cardiology) [name], LCSW as Social Worker (Licensed Clinical Social Worker)  Patient Active Problem List   Diagnosis Date Noted  . Stage 3 severe COPD by GOLD classification ([place]) 11/14/2018  . Noncompliance 04/23/2017  . Mitral regurgitation 04/23/2017  . Thrombus - possible apical thrombus 01/2017 04/23/2017  . Hyperlipidemia 04/23/2017  . Pulmonary hypertension ([place])   . SOB (shortness of breath)   . Palliative care by specialist   . Tobacco abuse 04/19/2017  . Acute respiratory failure with hypoxia ([place]) 01/27/2017  . Hypertensive urgency 01/27/2017  . CHF (congestive heart failure) ([place]) 01/26/2017  . CKD (chronic kidney disease), stage III 01/04/2017  . Essential hypertension 01/04/2017  . CVA (cerebral infarction) 12/12/2013  . Hyponatremia 12/06/2012  . Hiatal hernia 12/06/2012  . Syncope 12/06/2012  . Schizophrenia ([place])     Current Outpatient Medications:  .  aspirin 81 MG EC tablet, TAKE 1 TABLET BY MOUTH EVERY DAY, Disp: 30 tablet, Rfl: 1 .  atorvastatin (LIPITOR) 40 MG tablet, TAKE ONE TABLET BY MOUTH EVERY DAY IN THE EVENING, Disp: 30 tablet, Rfl: 6 .  bisoprolol (ZEBETA) 10 MG tablet, TAKE 1 TABLET (10 MG TOTAL) BY MOUTH DAILY., Disp: 90 tablet, Rfl: 1 .  furosemide (LASIX) 40 MG tablet, Take 0.5 tablets (20 mg total) by mouth daily., Disp: 30 tablet, Rfl: 6 .  isosorbide-hydrALAZINE (BIDIL) 20-37.5 MG tablet, Take 1 tablet by mouth 3 (three) times daily., Disp: 180 tablet, Rfl: 6 .  losartan (COZAAR) 25 MG tablet, Take 1 tablet (25 mg total) by mouth 2 (two) times daily., Disp: 180 tablet, Rfl: 3 .  potassium chloride SA (K-DUR) 20 MEQ tablet, TAKE ONE TABLET BY MOUTH ONCE DAILY (AM), Disp: 30 tablet, Rfl: 6 .   spironolactone (ALDACTONE) 25 MG tablet, TAKE ONE TABLET BY MOUTH ONCE DAILY (BEDTIME), Disp: 30 tablet, Rfl: 5 .  albuterol (PROVENTIL HFA;VENTOLIN HFA) 108 (90 Base) MCG/ACT inhaler, Inhale 1-2 puffs into the lungs every 6 (six) hours as needed for wheezing or shortness of breath., Disp: , Rfl:  .  Fluticasone-Umeclidin-Vilant (TRELEGY ELLIPTA) 100-62.5-25 MCG/INH AEPB, Inhale 1 puff into the lungs daily., Disp: 2 each, Rfl: 0 .  haloperidol decanoate (HALDOL DECANOATE) 100 MG/ML injection, INJECT 1 ML EVERY 4 WEEKS, Disp: , Rfl:   Current Facility-Administered Medications:  .  methylPREDNISolone acetate (DEPO-MEDROL) injection 40 mg, 40 mg, Intra-articular, Once, [name], [name], MD Allergies  Allergen Reactions  . Sulfa Antibiotics Nausea Only     Social History   Socioeconomic History  . Marital status: Single    Spouse name: Not on file  . Number of children: Not on file  . Years of education: Not on file  . Highest education level: Not on file  Occupational History  . Not on file  Social Needs  . Financial resource strain: Not on file  . Food insecurity    Worry: Not on file    Inability: Not on file  . Transportation needs    Medical: Not on file    Non-medical: Not on file  Tobacco Use  . Smoking status: Current Every Day Smoker    Packs/day: 1.50    Years: 44.00    Pack years: 66.00    Types:  Cigarettes    Start date: 14  . Smokeless tobacco: Never Used  . Tobacco comment: 1ppd as of 12/06/17 ep  Substance and Sexual Activity  . Alcohol use: No  . Drug use: No  . Sexual activity: Not on file  Lifestyle  . Physical activity    Days per week: Not on file    Minutes per session: Not on file  . Stress: Not on file  Relationships  . Social Herbalist on phone: Not on file    Gets together: Not on file    Attends religious service: Not on file    Active member of club or organization: Not on file    Attends meetings of clubs or organizations: Not on  file    Relationship status: Not on file  . Intimate partner violence    Fear of current or ex partner: Not on file    Emotionally abused: Not on file    Physically abused: Not on file    Forced sexual activity: Not on file  Other Topics Concern  . Not on file  Social History Narrative  . Not on file    Physical Exam Pulmonary:     Effort: Pulmonary effort is normal. No respiratory distress.     Breath sounds: No wheezing or rales.  Abdominal:     General: There is no distension.     Tenderness: There is no abdominal tenderness.  Musculoskeletal:     Right lower leg: No edema.     Left lower leg: No edema.  Skin:    General: Skin is warm and dry.         Future Appointments  Date Time Provider [place]  08/24/2019  1:40 PM [name], MD [facility] None     BP (!) 142/88 (BP Location: Right Arm, Patient Position: Sitting, Cuff Size: Normal)   Pulse 91   Temp 97.6 F (36.4 C)   SpO2 97%   Weight yesterday-147 Last visit weight-145  ATF pt CAO x4 sitting outside with no complaints.  He hasn't taken this morning medications besides Trelegy.  He denies sob, chest pain and dizziness.  He's still working without difficulty.  His next does of haldol is 05/11/19 (next week).  rx bottles verified and pill box refilled.  He has an appointment to get COVID-19 testing today 3 oclock.  He didn't weigh today because the scale is at the other house.    Medication ordered: bidil atorvastatin   Jermiah Soderman, EMT Paramedic [phone] 05/02/2019    ACTION: Home visit completed

## 2019-05-08 ENCOUNTER — Other Ambulatory Visit: Payer: Self-pay | Admitting: Student

## 2019-05-08 ENCOUNTER — Other Ambulatory Visit (HOSPITAL_COMMUNITY): Payer: Self-pay

## 2019-05-08 NOTE — Progress Notes (Signed)
Paramedicine Encounter    Patient ID: Gerald Nelson, male    DOB: 1950/12/14, 68 y.o.   MRN: [id number]    Patient Care Team: [name], MD as PCP - General (Internal Medicine) [name], MD as PCP - Advanced Heart Failure (Cardiology) [name], LCSW as Social Worker (Licensed Clinical Social Worker)  Patient Active Problem List   Diagnosis Date Noted  . Stage 3 severe COPD by GOLD classification ([place]) 11/14/2018  . Noncompliance 04/23/2017  . Mitral regurgitation 04/23/2017  . Thrombus - possible apical thrombus 01/2017 04/23/2017  . Hyperlipidemia 04/23/2017  . Pulmonary hypertension ([place])   . SOB (shortness of breath)   . Palliative care by specialist   . Tobacco abuse 04/19/2017  . Acute respiratory failure with hypoxia ([place]) 01/27/2017  . Hypertensive urgency 01/27/2017  . CHF (congestive heart failure) ([place]) 01/26/2017  . CKD (chronic kidney disease), stage III 01/04/2017  . Essential hypertension 01/04/2017  . CVA (cerebral infarction) 12/12/2013  . Hyponatremia 12/06/2012  . Hiatal hernia 12/06/2012  . Syncope 12/06/2012  . Schizophrenia ([place])     Current Outpatient Medications:  .  albuterol (PROVENTIL HFA;VENTOLIN HFA) 108 (90 Base) MCG/ACT inhaler, Inhale 1-2 puffs into the lungs every 6 (six) hours as needed for wheezing or shortness of breath., Disp: , Rfl:  .  aspirin 81 MG EC tablet, TAKE 1 TABLET BY MOUTH EVERY DAY, Disp: 30 tablet, Rfl: 1 .  atorvastatin (LIPITOR) 40 MG tablet, TAKE ONE TABLET BY MOUTH EVERY DAY IN THE EVENING, Disp: 30 tablet, Rfl: 6 .  bisoprolol (ZEBETA) 10 MG tablet, TAKE 1 TABLET (10 MG TOTAL) BY MOUTH DAILY., Disp: 90 tablet, Rfl: 1 .  Fluticasone-Umeclidin-Vilant (TRELEGY ELLIPTA) 100-62.5-25 MCG/INH AEPB, Inhale 1 puff into the lungs daily., Disp: 2 each, Rfl: 0 .  furosemide (LASIX) 40 MG tablet, Take 0.5 tablets (20 mg total) by mouth daily., Disp: 30 tablet, Rfl: 6 .  haloperidol decanoate (HALDOL DECANOATE) 100  MG/ML injection, INJECT 1 ML EVERY 4 WEEKS, Disp: , Rfl:  .  isosorbide-hydrALAZINE (BIDIL) 20-37.5 MG tablet, Take 1 tablet by mouth 3 (three) times daily., Disp: 180 tablet, Rfl: 6 .  losartan (COZAAR) 25 MG tablet, Take 1 tablet (25 mg total) by mouth 2 (two) times daily., Disp: 180 tablet, Rfl: 3 .  potassium chloride SA (K-DUR) 20 MEQ tablet, TAKE ONE TABLET BY MOUTH ONCE DAILY (AM), Disp: 30 tablet, Rfl: 6 .  spironolactone (ALDACTONE) 25 MG tablet, TAKE ONE TABLET BY MOUTH ONCE DAILY (BEDTIME), Disp: 30 tablet, Rfl: 5  Current Facility-Administered Medications:  .  methylPREDNISolone acetate (DEPO-MEDROL) injection 40 mg, 40 mg, Intra-articular, Once, [name], [name], MD Allergies  Allergen Reactions  . Sulfa Antibiotics Nausea Only     Social History   Socioeconomic History  . Marital status: Single    Spouse name: Not on file  . Number of children: Not on file  . Years of education: Not on file  . Highest education level: Not on file  Occupational History  . Not on file  Social Needs  . Financial resource strain: Not on file  . Food insecurity    Worry: Not on file    Inability: Not on file  . Transportation needs    Medical: Not on file    Non-medical: Not on file  Tobacco Use  . Smoking status: Current Every Day Smoker    Packs/day: 1.50    Years: 44.00    Pack years: 66.00    Types:  Cigarettes    Start date: 9  . Smokeless tobacco: Never Used  . Tobacco comment: 1ppd as of 12/06/17 ep  Substance and Sexual Activity  . Alcohol use: No  . Drug use: No  . Sexual activity: Not on file  Lifestyle  . Physical activity    Days per week: Not on file    Minutes per session: Not on file  . Stress: Not on file  Relationships  . Social Herbalist on phone: Not on file    Gets together: Not on file    Attends religious service: Not on file    Active member of club or organization: Not on file    Attends meetings of clubs or organizations: Not on file     Relationship status: Not on file  . Intimate partner violence    Fear of current or ex partner: Not on file    Emotionally abused: Not on file    Physically abused: Not on file    Forced sexual activity: Not on file  Other Topics Concern  . Not on file  Social History Narrative  . Not on file    Physical Exam Pulmonary:     Effort: Pulmonary effort is normal. No respiratory distress.     Breath sounds: No wheezing or rales.  Abdominal:     General: Abdomen is flat.  Musculoskeletal:        General: No swelling.     Right lower leg: No edema.     Left lower leg: No edema.  Skin:    General: Skin is warm and dry.         Future Appointments  Date Time Provider [place]  08/24/2019  1:40 PM [name], MD [facility] None     BP 118/76 (BP Location: Left Arm, Patient Position: Sitting, Cuff Size: Normal)   Pulse 86   Temp 98.5 F (36.9 C)   SpO2 98%    ATF pt CAO x4 sitting outside.  He denies sob, chest pain and dizziness. He's taken all of his medications for the past week.  I spoke with Cristy from Dr. Sherilyn Cooter office and she said that Dr. Deforest Hoyles wants him to continue taking plavix.  He picked up the vial of haldol earlier today; he has an appointment with his psychiatrist Friday.  rx bottles verified and pill box refilled. He still been able to weigh because his scale is at the house that he moved out of.  He said that he's going to get it this week.    Medication ordered: bispropol Losartan    Thomasina Housley, EMT Paramedic [phone] 05/08/2019    ACTION: Home visit completed

## 2019-05-15 ENCOUNTER — Other Ambulatory Visit (HOSPITAL_COMMUNITY): Payer: Self-pay

## 2019-05-15 ENCOUNTER — Other Ambulatory Visit (HOSPITAL_COMMUNITY): Payer: Self-pay | Admitting: Cardiology

## 2019-05-15 NOTE — Progress Notes (Signed)
Paramedicine Encounter    Patient ID: Gerald Nelson, male    DOB: [birth year], 68 y.o.   MRN: [id number]    Patient Care Team: [name], MD as PCP - General (Internal Medicine) [name], MD as PCP - Advanced Heart Failure (Cardiology) [name], LCSW as Social Worker (Licensed Clinical Social Worker)  Patient Active Problem List   Diagnosis Date Noted  . Stage 3 severe COPD by GOLD classification ([place]) 11/14/2018  . Noncompliance 04/23/2017  . Mitral regurgitation 04/23/2017  . Thrombus - possible apical thrombus 01/2017 04/23/2017  . Hyperlipidemia 04/23/2017  . Pulmonary hypertension ([place])   . SOB (shortness of breath)   . Palliative care by specialist   . Tobacco abuse 04/19/2017  . Acute respiratory failure with hypoxia ([place]) 01/27/2017  . Hypertensive urgency 01/27/2017  . CHF (congestive heart failure) ([place]) 01/26/2017  . CKD (chronic kidney disease), stage III 01/04/2017  . Essential hypertension 01/04/2017  . CVA (cerebral infarction) 12/12/2013  . Hyponatremia 12/06/2012  . Hiatal hernia 12/06/2012  . Syncope 12/06/2012  . Schizophrenia ([place])     Current Outpatient Medications:  .  albuterol (PROVENTIL HFA;VENTOLIN HFA) 108 (90 Base) MCG/ACT inhaler, Inhale 1-2 puffs into the lungs every 6 (six) hours as needed for wheezing or shortness of breath., Disp: , Rfl:  .  aspirin 81 MG EC tablet, TAKE 1 TABLET BY MOUTH EVERY DAY, Disp: 30 tablet, Rfl: 1 .  atorvastatin (LIPITOR) 40 MG tablet, TAKE ONE TABLET BY MOUTH EVERY DAY IN THE EVENING, Disp: 30 tablet, Rfl: 6 .  bisoprolol (ZEBETA) 10 MG tablet, TAKE 1 TABLET (10 MG TOTAL) BY MOUTH DAILY., Disp: 90 tablet, Rfl: 1 .  Fluticasone-Umeclidin-Vilant (TRELEGY ELLIPTA) 100-62.5-25 MCG/INH AEPB, Inhale 1 puff into the lungs daily., Disp: 2 each, Rfl: 0 .  furosemide (LASIX) 40 MG tablet, Take 0.5 tablets (20 mg total) by mouth daily., Disp: 30 tablet, Rfl: 6 .  haloperidol decanoate (HALDOL DECANOATE) 100  MG/ML injection, INJECT 1 ML EVERY 4 WEEKS, Disp: , Rfl:  .  isosorbide-hydrALAZINE (BIDIL) 20-37.5 MG tablet, Take 1 tablet by mouth 3 (three) times daily., Disp: 180 tablet, Rfl: 6 .  losartan (COZAAR) 25 MG tablet, Take 1 tablet (25 mg total) by mouth 2 (two) times daily., Disp: 180 tablet, Rfl: 3 .  potassium chloride SA (K-DUR) 20 MEQ tablet, TAKE ONE TABLET BY MOUTH ONCE DAILY (AM), Disp: 30 tablet, Rfl: 6 .  spironolactone (ALDACTONE) 25 MG tablet, TAKE ONE TABLET BY MOUTH ONCE DAILY (BEDTIME), Disp: 30 tablet, Rfl: 5  Current Facility-Administered Medications:  .  methylPREDNISolone acetate (DEPO-MEDROL) injection 40 mg, 40 mg, Intra-articular, Once, [name], [name], MD Allergies  Allergen Reactions  . Sulfa Antibiotics Nausea Only     Social History   Socioeconomic History  . Marital status: Single    Spouse name: Not on file  . Number of children: Not on file  . Years of education: Not on file  . Highest education level: Not on file  Occupational History  . Not on file  Social Needs  . Financial resource strain: Not on file  . Food insecurity    Worry: Not on file    Inability: Not on file  . Transportation needs    Medical: Not on file    Non-medical: Not on file  Tobacco Use  . Smoking status: Current Every Day Smoker    Packs/day: 1.50    Years: 44.00    Pack years: 66.00    Types:  Cigarettes    Start date: 14  . Smokeless tobacco: Never Used  . Tobacco comment: 1ppd as of 12/06/17 ep  Substance and Sexual Activity  . Alcohol use: No  . Drug use: No  . Sexual activity: Not on file  Lifestyle  . Physical activity    Days per week: Not on file    Minutes per session: Not on file  . Stress: Not on file  Relationships  . Social Herbalist on phone: Not on file    Gets together: Not on file    Attends religious service: Not on file    Active member of club or organization: Not on file    Attends meetings of clubs or organizations: Not on file     Relationship status: Not on file  . Intimate partner violence    Fear of current or ex partner: Not on file    Emotionally abused: Not on file    Physically abused: Not on file    Forced sexual activity: Not on file  Other Topics Concern  . Not on file  Social History Narrative  . Not on file    Physical Exam Pulmonary:     Effort: No respiratory distress.     Breath sounds: Wheezing present. No rales.     Comments: Wheezing noted to both lower lobes Abdominal:     General: There is no distension.  Musculoskeletal:        General: No swelling.     Right lower leg: No edema.     Left lower leg: No edema.  Skin:    General: Skin is warm and dry.         Future Appointments  Date Time Provider [place]  08/24/2019  1:40 PM [name], MD [facility] None     BP 108/80 (BP Location: Left Arm, Patient Position: Standing, Cuff Size: Normal)   Pulse 90   Temp 98.1 F (36.7 C)   Wt 145 lb (65.8 kg)   SpO2 98%   BMI 20.81 kg/m   Weight yesterday-didn't weigh Last visit weight-145 (04/24/19)  ATF pt CAO x4 sitting outside with no complaints. He denies sob, chest pain and dizziness.  He's taken all of his medications this week without missing any.  He's still able to work throughout the day without becoming dizzy or increased sob.  rx bottles verified and pill box refilled.      Medication ordered: Potassium bisoprolol Losartan  Carmin Dibartolo, EMT Paramedic [phone] 05/15/2019    ACTION: Home visit completed

## 2019-05-23 ENCOUNTER — Other Ambulatory Visit: Payer: Self-pay | Admitting: Student

## 2019-05-23 ENCOUNTER — Other Ambulatory Visit (HOSPITAL_COMMUNITY): Payer: Self-pay

## 2019-05-23 NOTE — Progress Notes (Signed)
Re-visit  He ran out of potassium and spirolactone, so I picked up his prescriptions from [facility].  His pill box refilled and pill bottles verified.

## 2019-06-05 ENCOUNTER — Other Ambulatory Visit (HOSPITAL_COMMUNITY): Payer: Self-pay

## 2019-06-05 NOTE — Progress Notes (Signed)
Paramedicine Encounter    Patient ID: Gerald Nelson, male    DOB: Jan 25, 1951, 68 y.o.   MRN: [id number]    Patient Care Team: [name], MD as PCP - General (Internal Medicine) [name], MD as PCP - Advanced Heart Failure (Cardiology) [name], LCSW as Social Worker (Licensed Clinical Social Worker)  Patient Active Problem List   Diagnosis Date Noted  . Stage 3 severe COPD by GOLD classification ([place]) 11/14/2018  . Noncompliance 04/23/2017  . Mitral regurgitation 04/23/2017  . Thrombus - possible apical thrombus 01/2017 04/23/2017  . Hyperlipidemia 04/23/2017  . Pulmonary hypertension ([place])   . SOB (shortness of breath)   . Palliative care by specialist   . Tobacco abuse 04/19/2017  . Acute respiratory failure with hypoxia ([place]) 01/27/2017  . Hypertensive urgency 01/27/2017  . CHF (congestive heart failure) ([place]) 01/26/2017  . CKD (chronic kidney disease), stage III 01/04/2017  . Essential hypertension 01/04/2017  . CVA (cerebral infarction) 12/12/2013  . Hyponatremia 12/06/2012  . Hiatal hernia 12/06/2012  . Syncope 12/06/2012  . Schizophrenia ([place])     Current Outpatient Medications:  .  albuterol (PROVENTIL HFA;VENTOLIN HFA) 108 (90 Base) MCG/ACT inhaler, Inhale 1-2 puffs into the lungs every 6 (six) hours as needed for wheezing or shortness of breath., Disp: , Rfl:  .  aspirin 81 MG EC tablet, TAKE 1 TABLET BY MOUTH EVERY DAY, Disp: 30 tablet, Rfl: 1 .  atorvastatin (LIPITOR) 40 MG tablet, TAKE ONE TABLET BY MOUTH EVERY DAY IN THE EVENING, Disp: 30 tablet, Rfl: 6 .  bisoprolol (ZEBETA) 10 MG tablet, TAKE 1 TABLET (10 MG TOTAL) BY MOUTH DAILY., Disp: 90 tablet, Rfl: 1 .  Fluticasone-Umeclidin-Vilant (TRELEGY ELLIPTA) 100-62.5-25 MCG/INH AEPB, Inhale 1 puff into the lungs daily., Disp: 2 each, Rfl: 0 .  furosemide (LASIX) 40 MG tablet, Take 0.5 tablets (20 mg total) by mouth daily., Disp: 30 tablet, Rfl: 6 .  haloperidol decanoate (HALDOL DECANOATE) 100  MG/ML injection, INJECT 1 ML EVERY 4 WEEKS, Disp: , Rfl:  .  isosorbide-hydrALAZINE (BIDIL) 20-37.5 MG tablet, Take 1 tablet by mouth 3 (three) times daily., Disp: 180 tablet, Rfl: 6 .  losartan (COZAAR) 25 MG tablet, Take 1 tablet (25 mg total) by mouth 2 (two) times daily., Disp: 180 tablet, Rfl: 3 .  potassium chloride SA (K-DUR) 20 MEQ tablet, TAKE ONE TABLET BY MOUTH ONCE DAILY (AM), Disp: 30 tablet, Rfl: 6 .  spironolactone (ALDACTONE) 25 MG tablet, TAKE ONE TABLET BY MOUTH ONCE DAILY (BEDTIME), Disp: 30 tablet, Rfl: 5  Current Facility-Administered Medications:  .  methylPREDNISolone acetate (DEPO-MEDROL) injection 40 mg, 40 mg, Intra-articular, Once, [name], [name], MD Allergies  Allergen Reactions  . Sulfa Antibiotics Nausea Only     Social History   Socioeconomic History  . Marital status: Single    Spouse name: Not on file  . Number of children: Not on file  . Years of education: Not on file  . Highest education level: Not on file  Occupational History  . Not on file  Social Needs  . Financial resource strain: Not on file  . Food insecurity    Worry: Not on file    Inability: Not on file  . Transportation needs    Medical: Not on file    Non-medical: Not on file  Tobacco Use  . Smoking status: Current Every Day Smoker    Packs/day: 1.50    Years: 44.00    Pack years: 66.00    Types:  Cigarettes    Start date: 41  . Smokeless tobacco: Never Used  . Tobacco comment: 1ppd as of 12/06/17 ep  Substance and Sexual Activity  . Alcohol use: No  . Drug use: No  . Sexual activity: Not on file  Lifestyle  . Physical activity    Days per week: Not on file    Minutes per session: Not on file  . Stress: Not on file  Relationships  . Social Herbalist on phone: Not on file    Gets together: Not on file    Attends religious service: Not on file    Active member of club or organization: Not on file    Attends meetings of clubs or organizations: Not on file     Relationship status: Not on file  . Intimate partner violence    Fear of current or ex partner: Not on file    Emotionally abused: Not on file    Physically abused: Not on file    Forced sexual activity: Not on file  Other Topics Concern  . Not on file  Social History Narrative  . Not on file    Physical Exam      Future Appointments  Date Time Provider [place]  08/24/2019  1:40 PM [name], MD [facility] None     BP 112/80 (BP Location: Left Arm, Patient Position: Standing, Cuff Size: Normal)   Pulse 93   Temp 98.5 F (36.9 C)   SpO2 98%   Weight yesterday-didn't weigh Last visit weight-145 (11/10)  ATF pt CAO x4 standing outside with no complaints.  He's breathing about 25 x's per min; Sp02 @  98%.  He said that he has taken Trelegy today but hasn't used his inhaler.  His RR has increase with wheezing in all lung fields.  He used the inhaler during our visit; I told him that it's important that he takes all of his medications as prescribed. 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  [place] said that [place].  I advised him to limit his exposure to people due to the increase in COVID cases.  rx bottles verified and pill box refilled.   I gave him a scale today.   Medication ordered: Atorvastatin bidil  Rosealee Recinos, EMT Paramedic [phone] 06/05/2019    ACTION: Home visit completed

## 2019-06-13 ENCOUNTER — Other Ambulatory Visit (HOSPITAL_COMMUNITY): Payer: Self-pay

## 2019-06-13 NOTE — Progress Notes (Signed)
Paramedicine Encounter    Patient ID: Gerald Nelson, male    DOB: 1951/04/08, 68 y.o.   MRN: [id number]    Patient Care Team: [name], MD as PCP - General (Internal Medicine) [name], MD as PCP - Advanced Heart Failure (Cardiology) [name], LCSW as Social Worker (Licensed Clinical Social Worker)  Patient Active Problem List   Diagnosis Date Noted  . Stage 3 severe COPD by GOLD classification ([place]) 11/14/2018  . Noncompliance 04/23/2017  . Mitral regurgitation 04/23/2017  . Thrombus - possible apical thrombus 01/2017 04/23/2017  . Hyperlipidemia 04/23/2017  . Pulmonary hypertension ([place])   . SOB (shortness of breath)   . Palliative care by specialist   . Tobacco abuse 04/19/2017  . Acute respiratory failure with hypoxia ([place]) 01/27/2017  . Hypertensive urgency 01/27/2017  . CHF (congestive heart failure) ([place]) 01/26/2017  . CKD (chronic kidney disease), stage III 01/04/2017  . Essential hypertension 01/04/2017  . CVA (cerebral infarction) 12/12/2013  . Hyponatremia 12/06/2012  . Hiatal hernia 12/06/2012  . Syncope 12/06/2012  . Schizophrenia ([place])     Current Outpatient Medications:  .  aspirin 81 MG EC tablet, TAKE 1 TABLET BY MOUTH EVERY DAY, Disp: 30 tablet, Rfl: 1 .  atorvastatin (LIPITOR) 40 MG tablet, TAKE ONE TABLET BY MOUTH EVERY DAY IN THE EVENING, Disp: 30 tablet, Rfl: 6 .  bisoprolol (ZEBETA) 10 MG tablet, TAKE 1 TABLET (10 MG TOTAL) BY MOUTH DAILY., Disp: 90 tablet, Rfl: 1 .  furosemide (LASIX) 40 MG tablet, Take 0.5 tablets (20 mg total) by mouth daily., Disp: 30 tablet, Rfl: 6 .  haloperidol decanoate (HALDOL DECANOATE) 100 MG/ML injection, INJECT 1 ML EVERY 4 WEEKS, Disp: , Rfl:  .  isosorbide-hydrALAZINE (BIDIL) 20-37.5 MG tablet, Take 1 tablet by mouth 3 (three) times daily., Disp: 180 tablet, Rfl: 6 .  losartan (COZAAR) 25 MG tablet, Take 1 tablet (25 mg total) by mouth 2 (two) times daily., Disp: 180 tablet, Rfl: 3 .  potassium chloride  SA (K-DUR) 20 MEQ tablet, TAKE ONE TABLET BY MOUTH ONCE DAILY (AM), Disp: 30 tablet, Rfl: 6 .  spironolactone (ALDACTONE) 25 MG tablet, TAKE ONE TABLET BY MOUTH ONCE DAILY (BEDTIME), Disp: 30 tablet, Rfl: 5 .  albuterol (PROVENTIL HFA;VENTOLIN HFA) 108 (90 Base) MCG/ACT inhaler, Inhale 1-2 puffs into the lungs every 6 (six) hours as needed for wheezing or shortness of breath., Disp: , Rfl:  .  Fluticasone-Umeclidin-Vilant (TRELEGY ELLIPTA) 100-62.5-25 MCG/INH AEPB, Inhale 1 puff into the lungs daily., Disp: 2 each, Rfl: 0  Current Facility-Administered Medications:  .  methylPREDNISolone acetate (DEPO-MEDROL) injection 40 mg, 40 mg, Intra-articular, Once, [name], [name], MD Allergies  Allergen Reactions  . Sulfa Antibiotics Nausea Only     Social History   Socioeconomic History  . Marital status: Single    Spouse name: Not on file  . Number of children: Not on file  . Years of education: Not on file  . Highest education level: Not on file  Occupational History  . Not on file  Tobacco Use  . Smoking status: Current Every Day Smoker    Packs/day: 1.50    Years: 44.00    Pack years: 66.00    Types: Cigarettes    Start date: 63  . Smokeless tobacco: Never Used  . Tobacco comment: 1ppd as of 12/06/17 ep  Substance and Sexual Activity  . Alcohol use: No  . Drug use: No  . Sexual activity: Not on file  Other Topics  Concern  . Not on file  Social History Narrative  . Not on file   Social Determinants of Health   Financial Resource Strain:   . Difficulty of Paying Living Expenses: Not on file  Food Insecurity:   . Worried About Charity fundraiser in the Last Year: Not on file  . Ran Out of Food in the Last Year: Not on file  Transportation Needs:   . Lack of Transportation (Medical): Not on file  . Lack of Transportation (Non-Medical): Not on file  Physical Activity:   . Days of Exercise per Week: Not on file  . Minutes of Exercise per Session: Not on file  Stress:   .  Feeling of Stress : Not on file  Social Connections:   . Frequency of Communication with Friends and Family: Not on file  . Frequency of Social Gatherings with Friends and Family: Not on file  . Attends Religious Services: Not on file  . Active Member of Clubs or Organizations: Not on file  . Attends Archivist Meetings: Not on file  . Marital Status: Not on file  Intimate Partner Violence:   . Fear of Current or Ex-Partner: Not on file  . Emotionally Abused: Not on file  . Physically Abused: Not on file  . Sexually Abused: Not on file    Physical Exam Pulmonary:     Effort: No respiratory distress.     Breath sounds: No wheezing.  Musculoskeletal:     Right lower leg: No edema.     Left lower leg: No edema.  Skin:    General: Skin is warm and dry.         Future Appointments  Date Time Provider [place]  08/24/2019  1:40 PM [name], MD [facility] None     BP 116/80 (BP Location: Left Arm, Patient Position: Standing, Cuff Size: Normal)   Pulse 80   Temp 98.5 F (36.9 C)   Wt 153 lb (69.4 kg)   SpO2 98%   BMI 21.95 kg/m   ATF pt CAO x4 standing outside smoking a cigarette with no complaints. He's taken all of his medications for the week; he got the haldol injection last Friday.  He denies sob, chest pain and dizziness. He continues to watch what he eats and is weighing. We discussed him getting blister pill packs again.  rx bottles verified and pill box refilled.     Medication ordered: Spirolactone poitassium   Arvil Utz, EMT Paramedic [phone] 06/14/2019    ACTION: Home visit completed

## 2019-06-19 ENCOUNTER — Other Ambulatory Visit (HOSPITAL_COMMUNITY): Payer: Self-pay

## 2019-06-19 NOTE — Progress Notes (Signed)
Paramedicine Encounter    Patient ID: Gerald Nelson, male    DOB: 1951/02/01, 68 y.o.   MRN: [id number]    Patient Care Team: [name], MD as PCP - General (Internal Medicine) [name], MD as PCP - Advanced Heart Failure (Cardiology) [name], LCSW as Social Worker (Licensed Clinical Social Worker)  Patient Active Problem List   Diagnosis Date Noted  . Stage 3 severe COPD by GOLD classification ([place]) 11/14/2018  . Noncompliance 04/23/2017  . Mitral regurgitation 04/23/2017  . Thrombus - possible apical thrombus 01/2017 04/23/2017  . Hyperlipidemia 04/23/2017  . Pulmonary hypertension ([place])   . SOB (shortness of breath)   . Palliative care by specialist   . Tobacco abuse 04/19/2017  . Acute respiratory failure with hypoxia ([place]) 01/27/2017  . Hypertensive urgency 01/27/2017  . CHF (congestive heart failure) ([place]) 01/26/2017  . CKD (chronic kidney disease), stage III 01/04/2017  . Essential hypertension 01/04/2017  . CVA (cerebral infarction) 12/12/2013  . Hyponatremia 12/06/2012  . Hiatal hernia 12/06/2012  . Syncope 12/06/2012  . Schizophrenia ([place])     Current Outpatient Medications:  .  aspirin 81 MG EC tablet, TAKE 1 TABLET BY MOUTH EVERY DAY, Disp: 30 tablet, Rfl: 1 .  atorvastatin (LIPITOR) 40 MG tablet, TAKE ONE TABLET BY MOUTH EVERY DAY IN THE EVENING, Disp: 30 tablet, Rfl: 6 .  bisoprolol (ZEBETA) 10 MG tablet, TAKE 1 TABLET (10 MG TOTAL) BY MOUTH DAILY., Disp: 90 tablet, Rfl: 1 .  furosemide (LASIX) 40 MG tablet, Take 0.5 tablets (20 mg total) by mouth daily., Disp: 30 tablet, Rfl: 6 .  isosorbide-hydrALAZINE (BIDIL) 20-37.5 MG tablet, Take 1 tablet by mouth 3 (three) times daily., Disp: 180 tablet, Rfl: 6 .  losartan (COZAAR) 25 MG tablet, Take 1 tablet (25 mg total) by mouth 2 (two) times daily., Disp: 180 tablet, Rfl: 3 .  potassium chloride SA (K-DUR) 20 MEQ tablet, TAKE ONE TABLET BY MOUTH ONCE DAILY (AM), Disp: 30 tablet, Rfl: 6 .   spironolactone (ALDACTONE) 25 MG tablet, TAKE ONE TABLET BY MOUTH ONCE DAILY (BEDTIME), Disp: 30 tablet, Rfl: 5 .  albuterol (PROVENTIL HFA;VENTOLIN HFA) 108 (90 Base) MCG/ACT inhaler, Inhale 1-2 puffs into the lungs every 6 (six) hours as needed for wheezing or shortness of breath., Disp: , Rfl:  .  Fluticasone-Umeclidin-Vilant (TRELEGY ELLIPTA) 100-62.5-25 MCG/INH AEPB, Inhale 1 puff into the lungs daily., Disp: 2 each, Rfl: 0 .  haloperidol decanoate (HALDOL DECANOATE) 100 MG/ML injection, INJECT 1 ML EVERY 4 WEEKS, Disp: , Rfl:   Current Facility-Administered Medications:  .  methylPREDNISolone acetate (DEPO-MEDROL) injection 40 mg, 40 mg, Intra-articular, Once, [name], [name], MD Allergies  Allergen Reactions  . Sulfa Antibiotics Nausea Only     Social History   Socioeconomic History  . Marital status: Single    Spouse name: Not on file  . Number of children: Not on file  . Years of education: Not on file  . Highest education level: Not on file  Occupational History  . Not on file  Tobacco Use  . Smoking status: Current Every Day Smoker    Packs/day: 1.50    Years: 44.00    Pack years: 66.00    Types: Cigarettes    Start date: 1  . Smokeless tobacco: Never Used  . Tobacco comment: 1ppd as of 12/06/17 ep  Substance and Sexual Activity  . Alcohol use: No  . Drug use: No  . Sexual activity: Not on file  Other Topics  Concern  . Not on file  Social History Narrative  . Not on file   Social Determinants of Health   Financial Resource Strain:   . Difficulty of Paying Living Expenses: Not on file  Food Insecurity:   . Worried About Charity fundraiser in the Last Year: Not on file  . Ran Out of Food in the Last Year: Not on file  Transportation Needs:   . Lack of Transportation (Medical): Not on file  . Lack of Transportation (Non-Medical): Not on file  Physical Activity:   . Days of Exercise per Week: Not on file  . Minutes of Exercise per Session: Not on file   Stress:   . Feeling of Stress : Not on file  Social Connections:   . Frequency of Communication with Friends and Family: Not on file  . Frequency of Social Gatherings with Friends and Family: Not on file  . Attends Religious Services: Not on file  . Active Member of Clubs or Organizations: Not on file  . Attends Archivist Meetings: Not on file  . Marital Status: Not on file  Intimate Partner Violence:   . Fear of Current or Ex-Partner: Not on file  . Emotionally Abused: Not on file  . Physically Abused: Not on file  . Sexually Abused: Not on file    Physical Exam Pulmonary:     Effort: No respiratory distress.     Breath sounds: Wheezing present. No rales.     Comments: Lower right lobe Musculoskeletal:        General: No swelling.     Right lower leg: No edema.     Left lower leg: No edema.  Skin:    General: Skin is warm and dry.         Future Appointments  Date Time Provider [place]  08/24/2019  1:40 PM [name], MD [facility] None     BP 136/88 (BP Location: Left Arm, Patient Position: Sitting, Cuff Size: Normal)   Pulse 84   Temp 99.1 F (37.3 C)   Wt 149 lb 9.6 oz (67.9 kg)   SpO2 96%   BMI 21.47 kg/m   Weight yesterday-149 Last visit weight-153  ATF pt CAO x4 sitting inside of the apartment watching tv with no complaints. He's taken his medications for last night and took today's during this visit.  He denies sob although wheezing was heard in the right lower lobe; he also denies chest pain and dizziness.  He's beginning to have auditory hallucinations again, which is norm for him 1 week before his dose of haldlol.     Medication ordered: none Gerald Nelson, EMT Paramedic [phone] 06/20/2019    ACTION: Home visit completed

## 2019-06-20 NOTE — Progress Notes (Signed)
Mr. Soto is unable to pick up his medications from [place].  I picked up her meds; the copay was added to his account.

## 2019-06-26 ENCOUNTER — Other Ambulatory Visit (HOSPITAL_COMMUNITY): Payer: Self-pay

## 2019-06-26 NOTE — Progress Notes (Signed)
Paramedicine Encounter    Patient ID: Gerald Nelson, male    DOB: 30-Nov-1950, 68 y.o.   MRN: [id number]    Patient Care Team: [name], MD as PCP - General (Internal Medicine) [name], MD as PCP - Advanced Heart Failure (Cardiology) [name], LCSW as Social Worker (Licensed Clinical Social Worker)  Patient Active Problem List   Diagnosis Date Noted  . Stage 3 severe COPD by GOLD classification ([place]) 11/14/2018  . Noncompliance 04/23/2017  . Mitral regurgitation 04/23/2017  . Thrombus - possible apical thrombus 01/2017 04/23/2017  . Hyperlipidemia 04/23/2017  . Pulmonary hypertension ([place])   . SOB (shortness of breath)   . Palliative care by specialist   . Tobacco abuse 04/19/2017  . Acute respiratory failure with hypoxia ([place]) 01/27/2017  . Hypertensive urgency 01/27/2017  . CHF (congestive heart failure) ([place]) 01/26/2017  . CKD (chronic kidney disease), stage III 01/04/2017  . Essential hypertension 01/04/2017  . CVA (cerebral infarction) 12/12/2013  . Hyponatremia 12/06/2012  . Hiatal hernia 12/06/2012  . Syncope 12/06/2012  . Schizophrenia ([place])     Current Outpatient Medications:  .  aspirin 81 MG EC tablet, TAKE 1 TABLET BY MOUTH EVERY DAY, Disp: 30 tablet, Rfl: 1 .  atorvastatin (LIPITOR) 40 MG tablet, TAKE ONE TABLET BY MOUTH EVERY DAY IN THE EVENING, Disp: 30 tablet, Rfl: 6 .  bisoprolol (ZEBETA) 10 MG tablet, TAKE 1 TABLET (10 MG TOTAL) BY MOUTH DAILY., Disp: 90 tablet, Rfl: 1 .  furosemide (LASIX) 40 MG tablet, Take 0.5 tablets (20 mg total) by mouth daily., Disp: 30 tablet, Rfl: 6 .  isosorbide-hydrALAZINE (BIDIL) 20-37.5 MG tablet, Take 1 tablet by mouth 3 (three) times daily., Disp: 180 tablet, Rfl: 6 .  losartan (COZAAR) 25 MG tablet, Take 1 tablet (25 mg total) by mouth 2 (two) times daily., Disp: 180 tablet, Rfl: 3 .  potassium chloride SA (K-DUR) 20 MEQ tablet, TAKE ONE TABLET BY MOUTH ONCE DAILY (AM), Disp: 30 tablet, Rfl: 6 .   spironolactone (ALDACTONE) 25 MG tablet, TAKE ONE TABLET BY MOUTH ONCE DAILY (BEDTIME), Disp: 30 tablet, Rfl: 5 .  albuterol (PROVENTIL HFA;VENTOLIN HFA) 108 (90 Base) MCG/ACT inhaler, Inhale 1-2 puffs into the lungs every 6 (six) hours as needed for wheezing or shortness of breath., Disp: , Rfl:  .  Fluticasone-Umeclidin-Vilant (TRELEGY ELLIPTA) 100-62.5-25 MCG/INH AEPB, Inhale 1 puff into the lungs daily., Disp: 2 each, Rfl: 0 .  haloperidol decanoate (HALDOL DECANOATE) 100 MG/ML injection, INJECT 1 ML EVERY 4 WEEKS, Disp: , Rfl:   Current Facility-Administered Medications:  .  methylPREDNISolone acetate (DEPO-MEDROL) injection 40 mg, 40 mg, Intra-articular, Once, [name], [name], MD Allergies  Allergen Reactions  . Sulfa Antibiotics Nausea Only     Social History   Socioeconomic History  . Marital status: Single    Spouse name: Not on file  . Number of children: Not on file  . Years of education: Not on file  . Highest education level: Not on file  Occupational History  . Not on file  Tobacco Use  . Smoking status: Current Every Day Smoker    Packs/day: 1.50    Years: 44.00    Pack years: 66.00    Types: Cigarettes    Start date: 78  . Smokeless tobacco: Never Used  . Tobacco comment: 1ppd as of 12/06/17 ep  Substance and Sexual Activity  . Alcohol use: No  . Drug use: No  . Sexual activity: Not on file  Other Topics  Concern  . Not on file  Social History Narrative  . Not on file   Social Determinants of Health   Financial Resource Strain:   . Difficulty of Paying Living Expenses: Not on file  Food Insecurity:   . Worried About Charity fundraiser in the Last Year: Not on file  . Ran Out of Food in the Last Year: Not on file  Transportation Needs:   . Lack of Transportation (Medical): Not on file  . Lack of Transportation (Non-Medical): Not on file  Physical Activity:   . Days of Exercise per Week: Not on file  . Minutes of Exercise per Session: Not on file   Stress:   . Feeling of Stress : Not on file  Social Connections:   . Frequency of Communication with Friends and Family: Not on file  . Frequency of Social Gatherings with Friends and Family: Not on file  . Attends Religious Services: Not on file  . Active Member of Clubs or Organizations: Not on file  . Attends Archivist Meetings: Not on file  . Marital Status: Not on file  Intimate Partner Violence:   . Fear of Current or Ex-Partner: Not on file  . Emotionally Abused: Not on file  . Physically Abused: Not on file  . Sexually Abused: Not on file    Physical Exam Pulmonary:     Breath sounds: No wheezing or rales.  Musculoskeletal:     Right lower leg: No edema.     Left lower leg: No edema.  Skin:    General: Skin is warm and dry.         Future Appointments  Date Time Provider [place]  08/24/2019  1:40 PM [name], MD [facility] None     BP 116/72 (BP Location: Left Arm, Patient Position: Standing, Cuff Size: Normal)   Pulse 72   Wt 146 lb 6.4 oz (66.4 kg)   SpO2 98%   BMI 21.01 kg/m   Weight yesterday-didn't weigh Last visit weight-149  ATF pt CAO x4 standing in the living room watching tv and smoking.  He denies sob, chest pain and dizziness.  He's taken all of his medications for the week.  He's already called the pharmacy for a refill trelegy inhaler.  He has only two puffs left.  rx bottles verified and pill box refilled.    Medication ordered: Potassium  Deiona Hooper, EMT Paramedic [phone] 06/27/2019    ACTION: Home visit completed

## 2019-07-03 ENCOUNTER — Telehealth (HOSPITAL_COMMUNITY): Payer: Self-pay

## 2019-07-03 ENCOUNTER — Other Ambulatory Visit (HOSPITAL_COMMUNITY): Payer: Self-pay

## 2019-07-03 NOTE — Telephone Encounter (Signed)
Gerald Nelson called to verify appointment time.  He said that theres a couple of medications that need to be picked up from [place].

## 2019-07-03 NOTE — Progress Notes (Signed)
Paramedicine Encounter    Patient ID: Gerald Nelson, male    DOB: 12/17/1950, 68 y.o.   MRN: [id number]    Patient Care Team: [name], MD as PCP - General (Internal Medicine) [name], MD as PCP - Advanced Heart Failure (Cardiology) [name], LCSW as Social Worker (Licensed Clinical Social Worker)  Patient Active Problem List   Diagnosis Date Noted  . Stage 3 severe COPD by GOLD classification ([place]) 11/14/2018  . Noncompliance 04/23/2017  . Mitral regurgitation 04/23/2017  . Thrombus - possible apical thrombus 01/2017 04/23/2017  . Hyperlipidemia 04/23/2017  . Pulmonary hypertension ([place])   . SOB (shortness of breath)   . Palliative care by specialist   . Tobacco abuse 04/19/2017  . Acute respiratory failure with hypoxia ([place]) 01/27/2017  . Hypertensive urgency 01/27/2017  . CHF (congestive heart failure) ([place]) 01/26/2017  . CKD (chronic kidney disease), stage III 01/04/2017  . Essential hypertension 01/04/2017  . CVA (cerebral infarction) 12/12/2013  . Hyponatremia 12/06/2012  . Hiatal hernia 12/06/2012  . Syncope 12/06/2012  . Schizophrenia ([place])     Current Outpatient Medications:  .  albuterol (PROVENTIL HFA;VENTOLIN HFA) 108 (90 Base) MCG/ACT inhaler, Inhale 1-2 puffs into the lungs every 6 (six) hours as needed for wheezing or shortness of breath., Disp: , Rfl:  .  aspirin 81 MG EC tablet, TAKE 1 TABLET BY MOUTH EVERY DAY, Disp: 30 tablet, Rfl: 1 .  atorvastatin (LIPITOR) 40 MG tablet, TAKE ONE TABLET BY MOUTH EVERY DAY IN THE EVENING, Disp: 30 tablet, Rfl: 6 .  bisoprolol (ZEBETA) 10 MG tablet, TAKE 1 TABLET (10 MG TOTAL) BY MOUTH DAILY., Disp: 90 tablet, Rfl: 1 .  Fluticasone-Umeclidin-Vilant (TRELEGY ELLIPTA) 100-62.5-25 MCG/INH AEPB, Inhale 1 puff into the lungs daily., Disp: 2 each, Rfl: 0 .  furosemide (LASIX) 40 MG tablet, Take 0.5 tablets (20 mg total) by mouth daily., Disp: 30 tablet, Rfl: 6 .  haloperidol decanoate (HALDOL DECANOATE) 100  MG/ML injection, INJECT 1 ML EVERY 4 WEEKS, Disp: , Rfl:  .  isosorbide-hydrALAZINE (BIDIL) 20-37.5 MG tablet, Take 1 tablet by mouth 3 (three) times daily., Disp: 180 tablet, Rfl: 6 .  losartan (COZAAR) 25 MG tablet, Take 1 tablet (25 mg total) by mouth 2 (two) times daily., Disp: 180 tablet, Rfl: 3 .  potassium chloride SA (K-DUR) 20 MEQ tablet, TAKE ONE TABLET BY MOUTH ONCE DAILY (AM), Disp: 30 tablet, Rfl: 6 .  spironolactone (ALDACTONE) 25 MG tablet, TAKE ONE TABLET BY MOUTH ONCE DAILY (BEDTIME), Disp: 30 tablet, Rfl: 5  Current Facility-Administered Medications:  .  methylPREDNISolone acetate (DEPO-MEDROL) injection 40 mg, 40 mg, Intra-articular, Once, [name], [name], MD Allergies  Allergen Reactions  . Sulfa Antibiotics Nausea Only     Social History   Socioeconomic History  . Marital status: Single    Spouse name: Not on file  . Number of children: Not on file  . Years of education: Not on file  . Highest education level: Not on file  Occupational History  . Not on file  Tobacco Use  . Smoking status: Current Every Day Smoker    Packs/day: 1.50    Years: 44.00    Pack years: 66.00    Types: Cigarettes    Start date: 6  . Smokeless tobacco: Never Used  . Tobacco comment: 1ppd as of 12/06/17 ep  Substance and Sexual Activity  . Alcohol use: No  . Drug use: No  . Sexual activity: Not on file  Other Topics  Concern  . Not on file  Social History Narrative  . Not on file   Social Determinants of Health   Financial Resource Strain:   . Difficulty of Paying Living Expenses: Not on file  Food Insecurity:   . Worried About Charity fundraiser in the Last Year: Not on file  . Ran Out of Food in the Last Year: Not on file  Transportation Needs:   . Lack of Transportation (Medical): Not on file  . Lack of Transportation (Non-Medical): Not on file  Physical Activity:   . Days of Exercise per Week: Not on file  . Minutes of Exercise per Session: Not on file  Stress:    . Feeling of Stress : Not on file  Social Connections:   . Frequency of Communication with Friends and Family: Not on file  . Frequency of Social Gatherings with Friends and Family: Not on file  . Attends Religious Services: Not on file  . Active Member of Clubs or Organizations: Not on file  . Attends Archivist Meetings: Not on file  . Marital Status: Not on file  Intimate Partner Violence:   . Fear of Current or Ex-Partner: Not on file  . Emotionally Abused: Not on file  . Physically Abused: Not on file  . Sexually Abused: Not on file    Physical Exam Pulmonary:     Effort: No respiratory distress.     Breath sounds: No wheezing or rales.  Abdominal:     General: There is no distension.  Musculoskeletal:        General: No swelling.     Right lower leg: No edema.     Left lower leg: No edema.  Skin:    General: Skin is warm and dry.         Future Appointments  Date Time Provider [place]  08/24/2019  1:40 PM [name], MD [facility] None     BP 112/80 (BP Location: Left Arm, Patient Position: Standing, Cuff Size: Normal)   Pulse 90   Temp 98.7 F (37.1 C)   Wt 148 lb (67.1 kg)   BMI 21.24 kg/m    ATF pt CAO x4 standing in the living room smoking a cigarette and watching tv.  He's talking to himself more than normal and pantting, his next haldol shot is next week.  He called victor at [facility] to make sure the prescription will be ready.  He denies pain, sob, and chest pain.  He's taken all of his medications for this week, including today.  He hasn't expressed any thoughts of harming himself or others.  Pt's lung sounds are clear.  rx bottles verified and pill box refilled.    Medication ordered: Haldol atorvastatin  Paulett Kaufhold, EMT Paramedic [phone] 07/03/2019    ACTION: Home visit completed

## 2019-07-10 ENCOUNTER — Other Ambulatory Visit (HOSPITAL_COMMUNITY): Payer: Self-pay

## 2019-07-10 NOTE — Progress Notes (Signed)
Paramedicine Encounter    Patient ID: Gerald Nelson, male    DOB: 25-Sep-1950, 69 y.o.   MRN: [id number]   Patient Care Team: [name], MD as PCP - General (Internal Medicine) [name], MD as PCP - Advanced Heart Failure (Cardiology) [name], LCSW as Social Worker (Licensed Clinical Social Worker)  Patient Active Problem List   Diagnosis Date Noted  . Stage 3 severe COPD by GOLD classification ([place]) 11/14/2018  . Noncompliance 04/23/2017  . Mitral regurgitation 04/23/2017  . Thrombus - possible apical thrombus 01/2017 04/23/2017  . Hyperlipidemia 04/23/2017  . Pulmonary hypertension ([place])   . SOB (shortness of breath)   . Palliative care by specialist   . Tobacco abuse 04/19/2017  . Acute respiratory failure with hypoxia ([place]) 01/27/2017  . Hypertensive urgency 01/27/2017  . CHF (congestive heart failure) ([place]) 01/26/2017  . CKD (chronic kidney disease), stage III 01/04/2017  . Essential hypertension 01/04/2017  . CVA (cerebral infarction) 12/12/2013  . Hyponatremia 12/06/2012  . Hiatal hernia 12/06/2012  . Syncope 12/06/2012  . Schizophrenia ([place])     Current Outpatient Medications:  .  albuterol (PROVENTIL HFA;VENTOLIN HFA) 108 (90 Base) MCG/ACT inhaler, Inhale 1-2 puffs into the lungs every 6 (six) hours as needed for wheezing or shortness of breath., Disp: , Rfl:  .  aspirin 81 MG EC tablet, TAKE 1 TABLET BY MOUTH EVERY DAY, Disp: 30 tablet, Rfl: 1 .  atorvastatin (LIPITOR) 40 MG tablet, TAKE ONE TABLET BY MOUTH EVERY DAY IN THE EVENING, Disp: 30 tablet, Rfl: 6 .  bisoprolol (ZEBETA) 10 MG tablet, TAKE 1 TABLET (10 MG TOTAL) BY MOUTH DAILY., Disp: 90 tablet, Rfl: 1 .  clopidogrel (PLAVIX) 75 MG tablet, Take 75 mg by mouth daily., Disp: , Rfl:  .  Fluticasone-Umeclidin-Vilant (TRELEGY ELLIPTA) 100-62.5-25 MCG/INH AEPB, Inhale 1 puff into the lungs daily., Disp: 2 each, Rfl: 0 .  furosemide (LASIX) 40 MG tablet, Take 0.5 tablets (20 mg total) by mouth  daily., Disp: 30 tablet, Rfl: 6 .  haloperidol decanoate (HALDOL DECANOATE) 100 MG/ML injection, INJECT 1 ML EVERY 4 WEEKS, Disp: , Rfl:  .  isosorbide-hydrALAZINE (BIDIL) 20-37.5 MG tablet, Take 1 tablet by mouth 3 (three) times daily., Disp: 180 tablet, Rfl: 6 .  losartan (COZAAR) 25 MG tablet, Take 1 tablet (25 mg total) by mouth 2 (two) times daily., Disp: 180 tablet, Rfl: 3 .  potassium chloride SA (K-DUR) 20 MEQ tablet, TAKE ONE TABLET BY MOUTH ONCE DAILY (AM), Disp: 30 tablet, Rfl: 6 .  spironolactone (ALDACTONE) 25 MG tablet, TAKE ONE TABLET BY MOUTH ONCE DAILY (BEDTIME), Disp: 30 tablet, Rfl: 5  Current Facility-Administered Medications:  .  methylPREDNISolone acetate (DEPO-MEDROL) injection 40 mg, 40 mg, Intra-articular, Once, [name], [name], MD Allergies  Allergen Reactions  . Sulfa Antibiotics Nausea Only      Social History   Socioeconomic History  . Marital status: Single    Spouse name: Not on file  . Number of children: Not on file  . Years of education: Not on file  . Highest education level: Not on file  Occupational History  . Not on file  Tobacco Use  . Smoking status: Current Every Day Smoker    Packs/day: 1.50    Years: 44.00    Pack years: 66.00    Types: Cigarettes    Start date: 66  . Smokeless tobacco: Never Used  . Tobacco comment: 1ppd as of 12/06/17 ep  Substance and Sexual Activity  . Alcohol  use: No  . Drug use: No  . Sexual activity: Not on file  Other Topics Concern  . Not on file  Social History Narrative  . Not on file   Social Determinants of Health   Financial Resource Strain:   . Difficulty of Paying Living Expenses: Not on file  Food Insecurity:   . Worried About Charity fundraiser in the Last Year: Not on file  . Ran Out of Food in the Last Year: Not on file  Transportation Needs:   . Lack of Transportation (Medical): Not on file  . Lack of Transportation (Non-Medical): Not on file  Physical Activity:   . Days of Exercise  per Week: Not on file  . Minutes of Exercise per Session: Not on file  Stress:   . Feeling of Stress : Not on file  Social Connections:   . Frequency of Communication with Friends and Family: Not on file  . Frequency of Social Gatherings with Friends and Family: Not on file  . Attends Religious Services: Not on file  . Active Member of Clubs or Organizations: Not on file  . Attends Archivist Meetings: Not on file  . Marital Status: Not on file  Intimate Partner Violence:   . Fear of Current or Ex-Partner: Not on file  . Emotionally Abused: Not on file  . Physically Abused: Not on file  . Sexually Abused: Not on file    Physical Exam      Future Appointments  Date Time Provider [place]  08/24/2019  1:40 PM [name], MD [facility] None    BP 124/82   Pulse 74   Temp (!) 97.1 F (36.2 C)   Resp 20   Wt 147 lb (66.7 kg)   SpO2 99%   BMI 21.09 kg/m   Weight yesterday-?? Last visit weight-148  Pt reports he is doing ok. He denies increased sob, no dizziness, no c/p. No edema noted. Lungs are wheezy all over. He said he did use inhaler today. He will use it again. He does not have sob with the wheezing.  meds verified and pill box refilled.  There was some question about his plavix--there was a note a few months ago that he was to restart it but it was not on his med list. I spoke to Roberts and she reports he is still to be taking it. I added it on his med list. Pt has no issues or concerns at the moment. He does need his potassium refilled but it is too soon to fill- per victor he will try on Monday. Still need to call to ask again though.   Marylouise Stacks, [facility] Paramedic  07/10/19

## 2019-07-17 ENCOUNTER — Other Ambulatory Visit (HOSPITAL_COMMUNITY): Payer: Self-pay

## 2019-07-17 NOTE — Progress Notes (Signed)
Paramedicine Encounter    Patient ID: Gerald Nelson, male    DOB: Oct 14, 1950, 69 y.o.   MRN: [id number]    Patient Care Team: [name], MD as PCP - General (Internal Medicine) [name], MD as PCP - Advanced Heart Failure (Cardiology) [name], LCSW as Social Worker (Licensed Clinical Social Worker)  Patient Active Problem List   Diagnosis Date Noted  . Stage 3 severe COPD by GOLD classification ([place]) 11/14/2018  . Noncompliance 04/23/2017  . Mitral regurgitation 04/23/2017  . Thrombus - possible apical thrombus 01/2017 04/23/2017  . Hyperlipidemia 04/23/2017  . Pulmonary hypertension ([place])   . SOB (shortness of breath)   . Palliative care by specialist   . Tobacco abuse 04/19/2017  . Acute respiratory failure with hypoxia ([place]) 01/27/2017  . Hypertensive urgency 01/27/2017  . CHF (congestive heart failure) ([place]) 01/26/2017  . CKD (chronic kidney disease), stage III 01/04/2017  . Essential hypertension 01/04/2017  . CVA (cerebral infarction) 12/12/2013  . Hyponatremia 12/06/2012  . Hiatal hernia 12/06/2012  . Syncope 12/06/2012  . Schizophrenia ([place])     Current Outpatient Medications:  .  aspirin 81 MG EC tablet, TAKE 1 TABLET BY MOUTH EVERY DAY, Disp: 30 tablet, Rfl: 1 .  atorvastatin (LIPITOR) 40 MG tablet, TAKE ONE TABLET BY MOUTH EVERY DAY IN THE EVENING, Disp: 30 tablet, Rfl: 6 .  bisoprolol (ZEBETA) 10 MG tablet, TAKE 1 TABLET (10 MG TOTAL) BY MOUTH DAILY., Disp: 90 tablet, Rfl: 1 .  furosemide (LASIX) 40 MG tablet, Take 0.5 tablets (20 mg total) by mouth daily., Disp: 30 tablet, Rfl: 6 .  haloperidol decanoate (HALDOL DECANOATE) 100 MG/ML injection, INJECT 1 ML EVERY 4 WEEKS, Disp: , Rfl:  .  isosorbide-hydrALAZINE (BIDIL) 20-37.5 MG tablet, Take 1 tablet by mouth 3 (three) times daily., Disp: 180 tablet, Rfl: 6 .  losartan (COZAAR) 25 MG tablet, Take 1 tablet (25 mg total) by mouth 2 (two) times daily., Disp: 180 tablet, Rfl: 3 .  potassium chloride  SA (K-DUR) 20 MEQ tablet, TAKE ONE TABLET BY MOUTH ONCE DAILY (AM), Disp: 30 tablet, Rfl: 6 .  spironolactone (ALDACTONE) 25 MG tablet, TAKE ONE TABLET BY MOUTH ONCE DAILY (BEDTIME), Disp: 30 tablet, Rfl: 5 .  albuterol (PROVENTIL HFA;VENTOLIN HFA) 108 (90 Base) MCG/ACT inhaler, Inhale 1-2 puffs into the lungs every 6 (six) hours as needed for wheezing or shortness of breath., Disp: , Rfl:  .  clopidogrel (PLAVIX) 75 MG tablet, Take 75 mg by mouth daily., Disp: , Rfl:  .  Fluticasone-Umeclidin-Vilant (TRELEGY ELLIPTA) 100-62.5-25 MCG/INH AEPB, Inhale 1 puff into the lungs daily., Disp: 2 each, Rfl: 0  Current Facility-Administered Medications:  .  methylPREDNISolone acetate (DEPO-MEDROL) injection 40 mg, 40 mg, Intra-articular, Once, [name], [name], MD Allergies  Allergen Reactions  . Sulfa Antibiotics Nausea Only     Social History   Socioeconomic History  . Marital status: Single    Spouse name: Not on file  . Number of children: Not on file  . Years of education: Not on file  . Highest education level: Not on file  Occupational History  . Not on file  Tobacco Use  . Smoking status: Current Every Day Smoker    Packs/day: 1.50    Years: 44.00    Pack years: 66.00    Types: Cigarettes    Start date: 23  . Smokeless tobacco: Never Used  . Tobacco comment: 1ppd as of 12/06/17 ep  Substance and Sexual Activity  . Alcohol  use: No  . Drug use: No  . Sexual activity: Not on file  Other Topics Concern  . Not on file  Social History Narrative  . Not on file   Social Determinants of Health   Financial Resource Strain:   . Difficulty of Paying Living Expenses: Not on file  Food Insecurity:   . Worried About Charity fundraiser in the Last Year: Not on file  . Ran Out of Food in the Last Year: Not on file  Transportation Needs:   . Lack of Transportation (Medical): Not on file  . Lack of Transportation (Non-Medical): Not on file  Physical Activity:   . Days of Exercise per  Week: Not on file  . Minutes of Exercise per Session: Not on file  Stress:   . Feeling of Stress : Not on file  Social Connections:   . Frequency of Communication with Friends and Family: Not on file  . Frequency of Social Gatherings with Friends and Family: Not on file  . Attends Religious Services: Not on file  . Active Member of Clubs or Organizations: Not on file  . Attends Archivist Meetings: Not on file  . Marital Status: Not on file  Intimate Partner Violence:   . Fear of Current or Ex-Partner: Not on file  . Emotionally Abused: Not on file  . Physically Abused: Not on file  . Sexually Abused: Not on file    Physical Exam Pulmonary:     Breath sounds: Rales present. No wheezing.     Comments: Wheezing noted in both lower lobes and top upper right lobe        Future Appointments  Date Time Provider [place]  08/24/2019  1:40 PM [name], MD [facility] None     BP 124/76 (BP Location: Left Arm, Patient Position: Standing, Cuff Size: Normal)   Pulse 81   Resp 14   Wt 145 lb (65.8 kg)   SpO2 96%   BMI 20.81 kg/m   Weight yesterday-145 Last visit weight-147  ATF pt CAO x4 standing inside of the house with no complaints. He said that he had a negative COVID rapid test today. His job has started back at [employer] but he has to wait for the results of the pcr test.  He denies sob although he does have wheezing noted to 3 lung fields.  He hasn't used his inhaler since this morning.  I advised him to do so.  He denies chest pain, dizziness and sob.  He's taken all of his medications for the week.  rx bottles verified and pill box refilled.    Medication ordered:  Potassium plavix  Trust Leh, EMT Paramedic [phone] 07/17/2019    ACTION: Home visit completed

## 2019-07-31 ENCOUNTER — Other Ambulatory Visit (HOSPITAL_COMMUNITY): Payer: Self-pay

## 2019-07-31 NOTE — Progress Notes (Signed)
Paramedicine Encounter    Patient ID: Gerald Nelson, male    DOB: August 20, 1950, 69 y.o.   MRN: [id number]    Patient Care Team: [name], MD as PCP - General (Internal Medicine) [name], MD as PCP - Advanced Heart Failure (Cardiology) [name], LCSW as Social Worker (Licensed Clinical Social Worker)  Patient Active Problem List   Diagnosis Date Noted  . Stage 3 severe COPD by GOLD classification ([place]) 11/14/2018  . Noncompliance 04/23/2017  . Mitral regurgitation 04/23/2017  . Thrombus - possible apical thrombus 01/2017 04/23/2017  . Hyperlipidemia 04/23/2017  . Pulmonary hypertension ([place])   . SOB (shortness of breath)   . Palliative care by specialist   . Tobacco abuse 04/19/2017  . Acute respiratory failure with hypoxia ([place]) 01/27/2017  . Hypertensive urgency 01/27/2017  . CHF (congestive heart failure) ([place]) 01/26/2017  . CKD (chronic kidney disease), stage III 01/04/2017  . Essential hypertension 01/04/2017  . CVA (cerebral infarction) 12/12/2013  . Hyponatremia 12/06/2012  . Hiatal hernia 12/06/2012  . Syncope 12/06/2012  . Schizophrenia ([place])     Current Outpatient Medications:  .  aspirin 81 MG EC tablet, TAKE 1 TABLET BY MOUTH EVERY DAY, Disp: 30 tablet, Rfl: 1 .  atorvastatin (LIPITOR) 40 MG tablet, TAKE ONE TABLET BY MOUTH EVERY DAY IN THE EVENING, Disp: 30 tablet, Rfl: 6 .  bisoprolol (ZEBETA) 10 MG tablet, TAKE 1 TABLET (10 MG TOTAL) BY MOUTH DAILY., Disp: 90 tablet, Rfl: 1 .  clopidogrel (PLAVIX) 75 MG tablet, Take 75 mg by mouth daily., Disp: , Rfl:  .  furosemide (LASIX) 40 MG tablet, Take 0.5 tablets (20 mg total) by mouth daily., Disp: 30 tablet, Rfl: 6 .  haloperidol decanoate (HALDOL DECANOATE) 100 MG/ML injection, INJECT 1 ML EVERY 4 WEEKS, Disp: , Rfl:  .  isosorbide-hydrALAZINE (BIDIL) 20-37.5 MG tablet, Take 1 tablet by mouth 3 (three) times daily., Disp: 180 tablet, Rfl: 6 .  losartan (COZAAR) 25 MG tablet, Take 1 tablet (25 mg  total) by mouth 2 (two) times daily., Disp: 180 tablet, Rfl: 3 .  potassium chloride SA (K-DUR) 20 MEQ tablet, TAKE ONE TABLET BY MOUTH ONCE DAILY (AM), Disp: 30 tablet, Rfl: 6 .  spironolactone (ALDACTONE) 25 MG tablet, TAKE ONE TABLET BY MOUTH ONCE DAILY (BEDTIME), Disp: 30 tablet, Rfl: 5 .  albuterol (PROVENTIL HFA;VENTOLIN HFA) 108 (90 Base) MCG/ACT inhaler, Inhale 1-2 puffs into the lungs every 6 (six) hours as needed for wheezing or shortness of breath., Disp: , Rfl:  .  Fluticasone-Umeclidin-Vilant (TRELEGY ELLIPTA) 100-62.5-25 MCG/INH AEPB, Inhale 1 puff into the lungs daily., Disp: 2 each, Rfl: 0  Current Facility-Administered Medications:  .  methylPREDNISolone acetate (DEPO-MEDROL) injection 40 mg, 40 mg, Intra-articular, Once, [name], [name], MD Allergies  Allergen Reactions  . Sulfa Antibiotics Nausea Only     Social History   Socioeconomic History  . Marital status: Single    Spouse name: Not on file  . Number of children: Not on file  . Years of education: Not on file  . Highest education level: Not on file  Occupational History  . Not on file  Tobacco Use  . Smoking status: Current Every Day Smoker    Packs/day: 1.50    Years: 44.00    Pack years: 66.00    Types: Cigarettes    Start date: 15  . Smokeless tobacco: Never Used  . Tobacco comment: 1ppd as of 12/06/17 ep  Substance and Sexual Activity  . Alcohol  use: No  . Drug use: No  . Sexual activity: Not on file  Other Topics Concern  . Not on file  Social History Narrative  . Not on file   Social Determinants of Health   Financial Resource Strain:   . Difficulty of Paying Living Expenses: Not on file  Food Insecurity:   . Worried About Charity fundraiser in the Last Year: Not on file  . Ran Out of Food in the Last Year: Not on file  Transportation Needs:   . Lack of Transportation (Medical): Not on file  . Lack of Transportation (Non-Medical): Not on file  Physical Activity:   . Days of Exercise  per Week: Not on file  . Minutes of Exercise per Session: Not on file  Stress:   . Feeling of Stress : Not on file  Social Connections:   . Frequency of Communication with Friends and Family: Not on file  . Frequency of Social Gatherings with Friends and Family: Not on file  . Attends Religious Services: Not on file  . Active Member of Clubs or Organizations: Not on file  . Attends Archivist Meetings: Not on file  . Marital Status: Not on file  Intimate Partner Violence:   . Fear of Current or Ex-Partner: Not on file  . Emotionally Abused: Not on file  . Physically Abused: Not on file  . Sexually Abused: Not on file    Physical Exam Pulmonary:     Effort: Pulmonary effort is normal. No respiratory distress.     Breath sounds: No wheezing or rales.  Abdominal:     General: There is no distension.  Musculoskeletal:     Right lower leg: No edema.     Left lower leg: No edema.  Skin:    General: Skin is warm and dry.         Future Appointments  Date Time Provider [place]  08/24/2019  1:40 PM [name], MD [facility] None     BP 118/74 (BP Location: Right Arm, Patient Position: Standing, Cuff Size: Normal)   Pulse 76   Temp (!) 97.4 F (36.3 C)   Wt 146 lb 12.8 oz (66.6 kg)   SpO2 96%   BMI 21.06 kg/m   Weight yesterday-146.7 Last visit weight-145  ATF pt CAO x4 standing outside smoking a cigarette.  He's taken all of his medications for the week. He's going to the pharmacy next week to pick up the rx for haldol.  He denies sob, chest pain and dizziness. He's starting work next week after getting a covid test.  rx bottles verified and pill box refilled. I told him that Nira Conn will start seeing him next week.     Medication ordered: [place], EMT Paramedic [phone] 08/02/2019    ACTION: Home visit completed

## 2019-08-07 ENCOUNTER — Other Ambulatory Visit (HOSPITAL_COMMUNITY): Payer: Self-pay

## 2019-08-07 NOTE — Progress Notes (Signed)
Paramedicine Encounter    Patient ID: Gerald Nelson, male    DOB: Jul 14, 1950, 69 y.o.   MRN: [id number]   Patient Care Team: Gerald Low, MD as PCP - General (Internal Medicine) Gerald Dresser, MD as PCP - Advanced Heart Failure (Cardiology) Gerald Ny, LCSW as Social Worker (Licensed Clinical Social Worker)  Patient Active Problem List   Diagnosis Date Noted  . Stage 3 severe COPD by GOLD classification ([place]) 11/14/2018  . Noncompliance 04/23/2017  . Mitral regurgitation 04/23/2017  . Thrombus - possible apical thrombus 01/2017 04/23/2017  . Hyperlipidemia 04/23/2017  . Pulmonary hypertension ([place])   . SOB (shortness of breath)   . Palliative care by specialist   . Tobacco abuse 04/19/2017  . Acute respiratory failure with hypoxia ([place]) 01/27/2017  . Hypertensive urgency 01/27/2017  . CHF (congestive heart failure) ([place]) 01/26/2017  . CKD (chronic kidney disease), stage III 01/04/2017  . Essential hypertension 01/04/2017  . CVA (cerebral infarction) 12/12/2013  . Hyponatremia 12/06/2012  . Hiatal hernia 12/06/2012  . Syncope 12/06/2012  . Schizophrenia ([place])     Current Outpatient Medications:  .  albuterol (PROVENTIL HFA;VENTOLIN HFA) 108 (90 Base) MCG/ACT inhaler, Inhale 1-2 puffs into the lungs every 6 (six) hours as needed for wheezing or shortness of breath., Disp: , Rfl:  .  aspirin 81 MG EC tablet, TAKE 1 TABLET BY MOUTH EVERY DAY, Disp: 30 tablet, Rfl: 1 .  atorvastatin (LIPITOR) 40 MG tablet, TAKE ONE TABLET BY MOUTH EVERY DAY IN THE EVENING, Disp: 30 tablet, Rfl: 6 .  bisoprolol (ZEBETA) 10 MG tablet, TAKE 1 TABLET (10 MG TOTAL) BY MOUTH DAILY., Disp: 90 tablet, Rfl: 1 .  clopidogrel (PLAVIX) 75 MG tablet, Take 75 mg by mouth daily., Disp: , Rfl:  .  Fluticasone-Umeclidin-Vilant (TRELEGY ELLIPTA) 100-62.5-25 MCG/INH AEPB, Inhale 1 puff into the lungs daily., Disp: 2 each, Rfl: 0 .  furosemide (LASIX) 40 MG tablet, Take 0.5 tablets (20 mg total) by mouth  daily., Disp: 30 tablet, Rfl: 6 .  haloperidol decanoate (HALDOL DECANOATE) 100 MG/ML injection, INJECT 1 ML EVERY 4 WEEKS, Disp: , Rfl:  .  isosorbide-hydrALAZINE (BIDIL) 20-37.5 MG tablet, Take 1 tablet by mouth 3 (three) times daily., Disp: 180 tablet, Rfl: 6 .  losartan (COZAAR) 25 MG tablet, Take 1 tablet (25 mg total) by mouth 2 (two) times daily., Disp: 180 tablet, Rfl: 3 .  potassium chloride SA (K-DUR) 20 MEQ tablet, TAKE ONE TABLET BY MOUTH ONCE DAILY (AM), Disp: 30 tablet, Rfl: 6 .  spironolactone (ALDACTONE) 25 MG tablet, TAKE ONE TABLET BY MOUTH ONCE DAILY (BEDTIME), Disp: 30 tablet, Rfl: 5  Current Facility-Administered Medications:  .  methylPREDNISolone acetate (DEPO-MEDROL) injection 40 mg, 40 mg, Intra-articular, Once, Nelson, [name], MD Allergies  Allergen Reactions  . Sulfa Antibiotics Nausea Only      Social History   Socioeconomic History  . Marital status: Single    Spouse name: Not on file  . Number of children: Not on file  . Years of education: Not on file  . Highest education level: Not on file  Occupational History  . Not on file  Tobacco Use  . Smoking status: Current Every Day Smoker    Packs/day: 1.50    Years: 44.00    Pack years: 66.00    Types: Cigarettes    Start date: 36  . Smokeless tobacco: Never Used  . Tobacco comment: 1ppd as of 12/06/17 ep  Substance and Sexual Activity  . Alcohol  use: No  . Drug use: No  . Sexual activity: Not on file  Other Topics Concern  . Not on file  Social History Narrative  . Not on file   Social Determinants of Health   Financial Resource Strain:   . Difficulty of Paying Living Expenses: Not on file  Food Insecurity:   . Worried About Charity fundraiser in the Last Year: Not on file  . Ran Out of Food in the Last Year: Not on file  Transportation Needs:   . Lack of Transportation (Medical): Not on file  . Lack of Transportation (Non-Medical): Not on file  Physical Activity:   . Days of Exercise  per Week: Not on file  . Minutes of Exercise per Session: Not on file  Stress:   . Feeling of Stress : Not on file  Social Connections:   . Frequency of Communication with Friends and Family: Not on file  . Frequency of Social Gatherings with Friends and Family: Not on file  . Attends Religious Services: Not on file  . Active Member of Clubs or Organizations: Not on file  . Attends Archivist Meetings: Not on file  . Marital Status: Not on file  Intimate Partner Violence:   . Fear of Current or Ex-Partner: Not on file  . Emotionally Abused: Not on file  . Physically Abused: Not on file  . Sexually Abused: Not on file    Physical Exam Cardiovascular:     Rate and Rhythm: Normal rate and regular rhythm.     Pulses: Normal pulses.  Pulmonary:     Effort: Pulmonary effort is normal.     Breath sounds: Normal breath sounds.  Abdominal:     General: Abdomen is flat. There is no distension.  Musculoskeletal:        General: Normal range of motion.     Right lower leg: No edema.     Left lower leg: No edema.  Skin:    General: Skin is warm and dry.     Capillary Refill: Capillary refill takes less than 2 seconds.  Neurological:     Mental Status: He is alert and oriented to person, place, and time.  Psychiatric:        Mood and Affect: Mood normal.         Future Appointments  Date Time Provider [place]  08/24/2019  1:40 PM Gerald Dresser, MD [facility] None    BP 120/67 (BP Location: Right Arm, Patient Position: Sitting, Cuff Size: Normal)   Pulse 72   Resp 16   Wt 147 lb 12.8 oz (67 kg)   SpO2 95%   BMI 21.21 kg/m   Weight yesterday- did not weigh Last visit weight- 146.8 lb  Gerald Nelson was seen at home today and reported feeling well. He denied chest pain, SOB, headache, dizziness, orthopnea, fever or cough over the past week. He stated he has been compliant with his medications over the past week and his weight has been stable. He is due this  week for his Haldol injection this week and stated he would be able to get himself to the pharmacy to get it. He will be followed up with next week by Gerald Nelson.   Gerald Nelson, EMT 08/07/19  ACTION: Home visit completed Next visit planned for 1 week

## 2019-08-14 ENCOUNTER — Other Ambulatory Visit (HOSPITAL_COMMUNITY): Payer: Self-pay

## 2019-08-14 NOTE — Progress Notes (Signed)
Paramedicine Encounter    Patient ID: Gerald Nelson, male    DOB: 1950/11/01, 69 y.o.   MRN: [id number]   Patient Care Team: [name], MD as PCP - General (Internal Medicine) [name], MD as PCP - Advanced Heart Failure (Cardiology) [name], LCSW as Social Worker (Licensed Clinical Social Worker)  Patient Active Problem List   Diagnosis Date Noted  . Stage 3 severe COPD by GOLD classification ([place]) 11/14/2018  . Noncompliance 04/23/2017  . Mitral regurgitation 04/23/2017  . Thrombus - possible apical thrombus 01/2017 04/23/2017  . Hyperlipidemia 04/23/2017  . Pulmonary hypertension ([place])   . SOB (shortness of breath)   . Palliative care by specialist   . Tobacco abuse 04/19/2017  . Acute respiratory failure with hypoxia ([place]) 01/27/2017  . Hypertensive urgency 01/27/2017  . CHF (congestive heart failure) ([place]) 01/26/2017  . CKD (chronic kidney disease), stage III 01/04/2017  . Essential hypertension 01/04/2017  . CVA (cerebral infarction) 12/12/2013  . Hyponatremia 12/06/2012  . Hiatal hernia 12/06/2012  . Syncope 12/06/2012  . Schizophrenia ([place])     Current Outpatient Medications:  .  aspirin 81 MG EC tablet, TAKE 1 TABLET BY MOUTH EVERY DAY, Disp: 30 tablet, Rfl: 1 .  atorvastatin (LIPITOR) 40 MG tablet, TAKE ONE TABLET BY MOUTH EVERY DAY IN THE EVENING, Disp: 30 tablet, Rfl: 6 .  bisoprolol (ZEBETA) 10 MG tablet, TAKE 1 TABLET (10 MG TOTAL) BY MOUTH DAILY., Disp: 90 tablet, Rfl: 1 .  clopidogrel (PLAVIX) 75 MG tablet, Take 75 mg by mouth daily., Disp: , Rfl:  .  Fluticasone-Umeclidin-Vilant (TRELEGY ELLIPTA) 100-62.5-25 MCG/INH AEPB, Inhale 1 puff into the lungs daily., Disp: 2 each, Rfl: 0 .  furosemide (LASIX) 40 MG tablet, Take 0.5 tablets (20 mg total) by mouth daily., Disp: 30 tablet, Rfl: 6 .  haloperidol decanoate (HALDOL DECANOATE) 100 MG/ML injection, INJECT 1 ML EVERY 4 WEEKS, Disp: , Rfl:  .  isosorbide-hydrALAZINE (BIDIL) 20-37.5 MG tablet,  Take 1 tablet by mouth 3 (three) times daily., Disp: 180 tablet, Rfl: 6 .  losartan (COZAAR) 25 MG tablet, Take 1 tablet (25 mg total) by mouth 2 (two) times daily., Disp: 180 tablet, Rfl: 3 .  potassium chloride SA (K-DUR) 20 MEQ tablet, TAKE ONE TABLET BY MOUTH ONCE DAILY (AM), Disp: 30 tablet, Rfl: 6 .  spironolactone (ALDACTONE) 25 MG tablet, TAKE ONE TABLET BY MOUTH ONCE DAILY (BEDTIME), Disp: 30 tablet, Rfl: 5 .  albuterol (PROVENTIL HFA;VENTOLIN HFA) 108 (90 Base) MCG/ACT inhaler, Inhale 1-2 puffs into the lungs every 6 (six) hours as needed for wheezing or shortness of breath., Disp: , Rfl:   Current Facility-Administered Medications:  .  methylPREDNISolone acetate (DEPO-MEDROL) injection 40 mg, 40 mg, Intra-articular, Once, [name], [name], MD Allergies  Allergen Reactions  . Sulfa Antibiotics Nausea Only      Social History   Socioeconomic History  . Marital status: Single    Spouse name: Not on file  . Number of children: Not on file  . Years of education: Not on file  . Highest education level: Not on file  Occupational History  . Not on file  Tobacco Use  . Smoking status: Current Every Day Smoker    Packs/day: 1.50    Years: 44.00    Pack years: 66.00    Types: Cigarettes    Start date: 20  . Smokeless tobacco: Never Used  . Tobacco comment: 1ppd as of 12/06/17 ep  Substance and Sexual Activity  . Alcohol  use: No  . Drug use: No  . Sexual activity: Not on file  Other Topics Concern  . Not on file  Social History Narrative  . Not on file   Social Determinants of Health   Financial Resource Strain:   . Difficulty of Paying Living Expenses: Not on file  Food Insecurity:   . Worried About Charity fundraiser in the Last Year: Not on file  . Ran Out of Food in the Last Year: Not on file  Transportation Needs:   . Lack of Transportation (Medical): Not on file  . Lack of Transportation (Non-Medical): Not on file  Physical Activity:   . Days of Exercise per  Week: Not on file  . Minutes of Exercise per Session: Not on file  Stress:   . Feeling of Stress : Not on file  Social Connections:   . Frequency of Communication with Friends and Family: Not on file  . Frequency of Social Gatherings with Friends and Family: Not on file  . Attends Religious Services: Not on file  . Active Member of Clubs or Organizations: Not on file  . Attends Archivist Meetings: Not on file  . Marital Status: Not on file  Intimate Partner Violence:   . Fear of Current or Ex-Partner: Not on file  . Emotionally Abused: Not on file  . Physically Abused: Not on file  . Sexually Abused: Not on file    Physical Exam      Future Appointments  Date Time Provider [place]  08/24/2019  1:40 PM [name], MD [facility] None    BP 134/90   Pulse 88   Temp (!) 97.1 F (36.2 C)   Resp 18   Wt 146 lb (66.2 kg)   SpO2 98%   BMI 20.95 kg/m   Weight yesterday-? Didn't weigh Last visit weight-147  Pt reports he is doing ok, he was smoking cigarette when I arrived. Pt denies increased sob, no dizziness. No c/p.  He does not have furniture in living room right now due to bedbugs.  Lungs sound wheezy, he cannot get his albuterol due to it being too soon-he can get it on 18th. He has the trelegy.  He ran out of the plavix and does not have the  bisoprolol for Friday/saturday. I called in these to pharmacy and will p/u tomor and bring out for him. He will also need potassium and pharmacy will get it ready on Monday-its too soon to fill right now.  meds verified and pill box refilled.  He did get his haldol shot last week.    Marylouise Stacks, [facility] Paramedic  08/14/19

## 2019-08-15 ENCOUNTER — Other Ambulatory Visit (HOSPITAL_COMMUNITY): Payer: Self-pay

## 2019-08-15 NOTE — Progress Notes (Signed)
P/u meds from pharmacy, he needed plavix and bisoprolol placed in pill box and that was done.    Marylouise Stacks, EMT-Paramedic  08/15/19

## 2019-08-21 ENCOUNTER — Other Ambulatory Visit (HOSPITAL_COMMUNITY): Payer: Self-pay

## 2019-08-21 ENCOUNTER — Other Ambulatory Visit (HOSPITAL_COMMUNITY): Payer: Self-pay | Admitting: Cardiology

## 2019-08-21 NOTE — Progress Notes (Signed)
Paramedicine Encounter    Patient ID: Gerald Nelson, male    DOB: 06/30/1951, 69 y.o.   MRN: [id number]   Patient Care Team: [name], MD as PCP - General (Internal Medicine) [name], MD as PCP - Advanced Heart Failure (Cardiology) [name], LCSW as Social Worker (Licensed Clinical Social Worker)  Patient Active Problem List   Diagnosis Date Noted  . Stage 3 severe COPD by GOLD classification ([place]) 11/14/2018  . Noncompliance 04/23/2017  . Mitral regurgitation 04/23/2017  . Thrombus - possible apical thrombus 01/2017 04/23/2017  . Hyperlipidemia 04/23/2017  . Pulmonary hypertension ([place])   . SOB (shortness of breath)   . Palliative care by specialist   . Tobacco abuse 04/19/2017  . Acute respiratory failure with hypoxia ([place]) 01/27/2017  . Hypertensive urgency 01/27/2017  . CHF (congestive heart failure) ([place]) 01/26/2017  . CKD (chronic kidney disease), stage III 01/04/2017  . Essential hypertension 01/04/2017  . CVA (cerebral infarction) 12/12/2013  . Hyponatremia 12/06/2012  . Hiatal hernia 12/06/2012  . Syncope 12/06/2012  . Schizophrenia ([place])     Current Outpatient Medications:  .  albuterol (PROVENTIL HFA;VENTOLIN HFA) 108 (90 Base) MCG/ACT inhaler, Inhale 1-2 puffs into the lungs every 6 (six) hours as needed for wheezing or shortness of breath., Disp: , Rfl:  .  aspirin 81 MG EC tablet, TAKE 1 TABLET BY MOUTH EVERY DAY, Disp: 30 tablet, Rfl: 1 .  atorvastatin (LIPITOR) 40 MG tablet, TAKE ONE TABLET BY MOUTH EVERY DAY IN THE EVENING, Disp: 30 tablet, Rfl: 6 .  bisoprolol (ZEBETA) 10 MG tablet, TAKE 1 TABLET (10 MG TOTAL) BY MOUTH DAILY., Disp: 90 tablet, Rfl: 1 .  clopidogrel (PLAVIX) 75 MG tablet, Take 75 mg by mouth daily., Disp: , Rfl:  .  furosemide (LASIX) 40 MG tablet, Take 0.5 tablets (20 mg total) by mouth daily., Disp: 30 tablet, Rfl: 6 .  haloperidol decanoate (HALDOL DECANOATE) 100 MG/ML injection, INJECT 1 ML EVERY 4 WEEKS, Disp: , Rfl:  .   isosorbide-hydrALAZINE (BIDIL) 20-37.5 MG tablet, Take 1 tablet by mouth 3 (three) times daily., Disp: 180 tablet, Rfl: 6 .  losartan (COZAAR) 25 MG tablet, Take 1 tablet (25 mg total) by mouth 2 (two) times daily., Disp: 180 tablet, Rfl: 3 .  potassium chloride SA (K-DUR) 20 MEQ tablet, TAKE ONE TABLET BY MOUTH ONCE DAILY (AM), Disp: 30 tablet, Rfl: 6 .  spironolactone (ALDACTONE) 25 MG tablet, TAKE ONE TABLET BY MOUTH ONCE DAILY (BEDTIME), Disp: 30 tablet, Rfl: 5 .  Fluticasone-Umeclidin-Vilant (TRELEGY ELLIPTA) 100-62.5-25 MCG/INH AEPB, Inhale 1 puff into the lungs daily., Disp: 2 each, Rfl: 0  Current Facility-Administered Medications:  .  methylPREDNISolone acetate (DEPO-MEDROL) injection 40 mg, 40 mg, Intra-articular, Once, [name], [name], MD Allergies  Allergen Reactions  . Sulfa Antibiotics Nausea Only     Social History   Socioeconomic History  . Marital status: Single    Spouse name: Not on file  . Number of children: Not on file  . Years of education: Not on file  . Highest education level: Not on file  Occupational History  . Not on file  Tobacco Use  . Smoking status: Current Every Day Smoker    Packs/day: 1.50    Years: 44.00    Pack years: 66.00    Types: Cigarettes    Start date: 52  . Smokeless tobacco: Never Used  . Tobacco comment: 1ppd as of 12/06/17 ep  Substance and Sexual Activity  . Alcohol use:  No  . Drug use: No  . Sexual activity: Not on file  Other Topics Concern  . Not on file  Social History Narrative  . Not on file   Social Determinants of Health   Financial Resource Strain:   . Difficulty of Paying Living Expenses: Not on file  Food Insecurity:   . Worried About Charity fundraiser in the Last Year: Not on file  . Ran Out of Food in the Last Year: Not on file  Transportation Needs:   . Lack of Transportation (Medical): Not on file  . Lack of Transportation (Non-Medical): Not on file  Physical Activity:   . Days of Exercise per  Week: Not on file  . Minutes of Exercise per Session: Not on file  Stress:   . Feeling of Stress : Not on file  Social Connections:   . Frequency of Communication with Friends and Family: Not on file  . Frequency of Social Gatherings with Friends and Family: Not on file  . Attends Religious Services: Not on file  . Active Member of Clubs or Organizations: Not on file  . Attends Archivist Meetings: Not on file  . Marital Status: Not on file  Intimate Partner Violence:   . Fear of Current or Ex-Partner: Not on file  . Emotionally Abused: Not on file  . Physically Abused: Not on file  . Sexually Abused: Not on file    Physical Exam Vitals reviewed.  Constitutional:      Appearance: He is normal weight.  HENT:     Head: Normocephalic.     Nose: Nose normal.     Mouth/Throat:     Mouth: Mucous membranes are moist.  Eyes:     Pupils: Pupils are equal, round, and reactive to light.  Cardiovascular:     Rate and Rhythm: Normal rate and regular rhythm.     Pulses: Normal pulses.     Heart sounds: Normal heart sounds.  Pulmonary:     Effort: Pulmonary effort is normal.     Breath sounds: Normal breath sounds.  Abdominal:     General: Abdomen is flat.     Palpations: Abdomen is soft.  Musculoskeletal:        General: Normal range of motion.     Right lower leg: No edema.     Left lower leg: No edema.  Skin:    General: Skin is warm and dry.     Capillary Refill: Capillary refill takes less than 2 seconds.  Neurological:     Mental Status: He is alert. Mental status is at baseline.  Psychiatric:        Mood and Affect: Mood normal.      Arrived for home visit for Mr. Heitner who was ambulatory without difficulty breathing around his home on my arrival. Mr. Pinnow Medications were reviewed and confirmed. Pill box was completed. Vitals were obtained and reviewed. Mr. 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 He is currently living at [address] He says he doesn't enjoy his roommate so he is thinking of moving. Mr. Misak stated he did not need assistance with same. Medications for refill documented. Home visit complete. Mr. Grgurich agreed to home visit in one week.   Refills: Potassium Losartan Spironalactone    Weight- 145.8lbs   Future Appointments  Date Time Provider [place]  09/03/2019  2:20 PM [name],  MD [facility] None     ACTION: Home visit completed Next visit planned for one week

## 2019-08-24 ENCOUNTER — Encounter (HOSPITAL_COMMUNITY): Payer: Medicare Other | Admitting: Cardiology

## 2019-08-28 ENCOUNTER — Other Ambulatory Visit (HOSPITAL_COMMUNITY): Payer: Self-pay

## 2019-08-28 ENCOUNTER — Ambulatory Visit (HOSPITAL_COMMUNITY): Payer: Medicare Other | Admitting: Cardiology

## 2019-08-28 NOTE — Progress Notes (Signed)
Paramedicine Encounter    Patient ID: Gerald Nelson, male    DOB: 1950/08/10, 69 y.o.   MRN: [id number]   Patient Care Team: [name], MD as PCP - General (Internal Medicine) [name], MD as PCP - Advanced Heart Failure (Cardiology) [name], LCSW as Social Worker (Licensed Clinical Social Worker)  Patient Active Problem List   Diagnosis Date Noted  . Stage 3 severe COPD by GOLD classification ([place]) 11/14/2018  . Noncompliance 04/23/2017  . Mitral regurgitation 04/23/2017  . Thrombus - possible apical thrombus 01/2017 04/23/2017  . Hyperlipidemia 04/23/2017  . Pulmonary hypertension ([place])   . SOB (shortness of breath)   . Palliative care by specialist   . Tobacco abuse 04/19/2017  . Acute respiratory failure with hypoxia ([place]) 01/27/2017  . Hypertensive urgency 01/27/2017  . CHF (congestive heart failure) ([place]) 01/26/2017  . CKD (chronic kidney disease), stage III 01/04/2017  . Essential hypertension 01/04/2017  . CVA (cerebral infarction) 12/12/2013  . Hyponatremia 12/06/2012  . Hiatal hernia 12/06/2012  . Syncope 12/06/2012  . Schizophrenia ([place])     Current Outpatient Medications:  .  albuterol (PROVENTIL HFA;VENTOLIN HFA) 108 (90 Base) MCG/ACT inhaler, Inhale 1-2 puffs into the lungs every 6 (six) hours as needed for wheezing or shortness of breath., Disp: , Rfl:  .  aspirin 81 MG EC tablet, TAKE 1 TABLET BY MOUTH EVERY DAY, Disp: 30 tablet, Rfl: 1 .  atorvastatin (LIPITOR) 40 MG tablet, TAKE ONE TABLET BY MOUTH EVERY DAY IN THE EVENING, Disp: 30 tablet, Rfl: 6 .  bisoprolol (ZEBETA) 10 MG tablet, TAKE 1 TABLET (10 MG TOTAL) BY MOUTH DAILY., Disp: 90 tablet, Rfl: 1 .  clopidogrel (PLAVIX) 75 MG tablet, Take 75 mg by mouth daily., Disp: , Rfl:  .  Fluticasone-Umeclidin-Vilant (TRELEGY ELLIPTA) 100-62.5-25 MCG/INH AEPB, Inhale 1 puff into the lungs daily., Disp: 2 each, Rfl: 0 .  furosemide (LASIX) 40 MG tablet, Take 0.5 tablets (20 mg total) by mouth  daily., Disp: 30 tablet, Rfl: 6 .  haloperidol decanoate (HALDOL DECANOATE) 100 MG/ML injection, INJECT 1 ML EVERY 4 WEEKS, Disp: , Rfl:  .  isosorbide-hydrALAZINE (BIDIL) 20-37.5 MG tablet, Take 1 tablet by mouth 3 (three) times daily., Disp: 180 tablet, Rfl: 6 .  losartan (COZAAR) 25 MG tablet, TAKE 1 TABLET (25 MG TOTAL) BY MOUTH 2 (TWO) TIMES DAILY., Disp: 180 tablet, Rfl: 3 .  potassium chloride SA (K-DUR) 20 MEQ tablet, TAKE ONE TABLET BY MOUTH ONCE DAILY (AM), Disp: 30 tablet, Rfl: 6 .  spironolactone (ALDACTONE) 25 MG tablet, TAKE ONE TABLET BY MOUTH ONCE DAILY (BEDTIME), Disp: 30 tablet, Rfl: 5  Current Facility-Administered Medications:  .  methylPREDNISolone acetate (DEPO-MEDROL) injection 40 mg, 40 mg, Intra-articular, Once, [name], [name], MD Allergies  Allergen Reactions  . Sulfa Antibiotics Nausea Only     Social History   Socioeconomic History  . Marital status: Single    Spouse name: Not on file  . Number of children: Not on file  . Years of education: Not on file  . Highest education level: Not on file  Occupational History  . Not on file  Tobacco Use  . Smoking status: Current Every Day Smoker    Packs/day: 1.50    Years: 44.00    Pack years: 66.00    Types: Cigarettes    Start date: 68  . Smokeless tobacco: Never Used  . Tobacco comment: 1ppd as of 12/06/17 ep  Substance and Sexual Activity  . Alcohol use:  No  . Drug use: No  . Sexual activity: Not on file  Other Topics Concern  . Not on file  Social History Narrative  . Not on file   Social Determinants of Health   Financial Resource Strain:   . Difficulty of Paying Living Expenses: Not on file  Food Insecurity:   . Worried About Charity fundraiser in the Last Year: Not on file  . Ran Out of Food in the Last Year: Not on file  Transportation Needs:   . Lack of Transportation (Medical): Not on file  . Lack of Transportation (Non-Medical): Not on file  Physical Activity:   . Days of Exercise  per Week: Not on file  . Minutes of Exercise per Session: Not on file  Stress:   . Feeling of Stress : Not on file  Social Connections:   . Frequency of Communication with Friends and Family: Not on file  . Frequency of Social Gatherings with Friends and Family: Not on file  . Attends Religious Services: Not on file  . Active Member of Clubs or Organizations: Not on file  . Attends Archivist Meetings: Not on file  . Marital Status: Not on file  Intimate Partner Violence:   . Fear of Current or Ex-Partner: Not on file  . Emotionally Abused: Not on file  . Physically Abused: Not on file  . Sexually Abused: Not on file    Physical Exam Vitals reviewed.  HENT:     Head: Normocephalic.     Nose: Nose normal.     Mouth/Throat:     Mouth: Mucous membranes are moist.  Eyes:     Pupils: Pupils are equal, round, and reactive to light.  Cardiovascular:     Rate and Rhythm: Normal rate.     Pulses: Normal pulses.  Pulmonary:     Effort: Pulmonary effort is normal.  Abdominal:     General: Abdomen is flat.     Palpations: Abdomen is soft.  Musculoskeletal:        General: Normal range of motion.     Cervical back: Normal range of motion.     Right lower leg: No edema.     Left lower leg: No edema.  Skin:    General: Skin is warm and dry.     Capillary Refill: Capillary refill takes less than 2 seconds.  Neurological:     Mental Status: He is alert. Mental status is at baseline.  Psychiatric:        Mood and Affect: Mood normal.    Arrived for home visit for Gerald Nelson. Gerald Nelson. He was alert, oriented and ambulating around the house. He reports feeling "good". Gerald Nelson denied increased shortness of breath and stated he has used his inhalers like normal. Gerald Nelson. Nelson reports he is still smoking daily. Medications were reviewed. Gerald Nelson. Nelson seems to struggle taking his noon dose of Bidil. He report it is due to him being at work around that time. I encouraged him to take his  medications with him if they won't get lost so he can ensure he is getting his second daily dose of Bidil. Gerald Nelson. Nelson agreed with same and stated he will do better. Medications confirmed, pill box filled. No refills needed at this time. Vitals obtained and are as noted. Patient had no complaints.  Gerald Nelson. Bech agreed to home visit in one week at 2:00. Home visit complete.    Weight- 147lbs   Future Appointments  Date Time  Provider [place]  09/03/2019  2:20 PM [name], MD [facility] None     ACTION: Home visit completed Next visit planned for one week at 2pm

## 2019-09-03 ENCOUNTER — Other Ambulatory Visit: Payer: Self-pay

## 2019-09-03 ENCOUNTER — Ambulatory Visit (HOSPITAL_COMMUNITY)
Admission: RE | Admit: 2019-09-03 | Discharge: 2019-09-03 | Disposition: A | Discharge status: Home / Self Care | Payer: Medicare Other | Source: Ambulatory Visit | Attending: Cardiology | Admitting: Cardiology

## 2019-09-03 ENCOUNTER — Other Ambulatory Visit (HOSPITAL_COMMUNITY): Payer: Self-pay

## 2019-09-03 ENCOUNTER — Encounter (HOSPITAL_COMMUNITY): Payer: Self-pay | Admitting: Cardiology

## 2019-09-03 VITALS — BP 130/90 | HR 78 | Wt 148.6 lb

## 2019-09-03 DIAGNOSIS — Z7982 Long term (current) use of aspirin: Secondary | ICD-10-CM | POA: Insufficient documentation

## 2019-09-03 DIAGNOSIS — R0602 Shortness of breath: Secondary | ICD-10-CM | POA: Diagnosis present

## 2019-09-03 DIAGNOSIS — I2510 Atherosclerotic heart disease of native coronary artery without angina pectoris: Secondary | ICD-10-CM | POA: Insufficient documentation

## 2019-09-03 DIAGNOSIS — Z8249 Family history of ischemic heart disease and other diseases of the circulatory system: Secondary | ICD-10-CM | POA: Insufficient documentation

## 2019-09-03 DIAGNOSIS — N183 Chronic kidney disease, stage 3 unspecified: Secondary | ICD-10-CM | POA: Insufficient documentation

## 2019-09-03 DIAGNOSIS — I130 Hypertensive heart and chronic kidney disease with heart failure and stage 1 through stage 4 chronic kidney disease, or unspecified chronic kidney disease: Secondary | ICD-10-CM | POA: Insufficient documentation

## 2019-09-03 DIAGNOSIS — F17210 Nicotine dependence, cigarettes, uncomplicated: Secondary | ICD-10-CM | POA: Diagnosis not present

## 2019-09-03 DIAGNOSIS — F209 Schizophrenia, unspecified: Secondary | ICD-10-CM | POA: Insufficient documentation

## 2019-09-03 DIAGNOSIS — J449 Chronic obstructive pulmonary disease, unspecified: Secondary | ICD-10-CM | POA: Diagnosis not present

## 2019-09-03 DIAGNOSIS — I272 Pulmonary hypertension, unspecified: Secondary | ICD-10-CM | POA: Diagnosis not present

## 2019-09-03 DIAGNOSIS — Z79899 Other long term (current) drug therapy: Secondary | ICD-10-CM | POA: Insufficient documentation

## 2019-09-03 DIAGNOSIS — Z8673 Personal history of transient ischemic attack (TIA), and cerebral infarction without residual deficits: Secondary | ICD-10-CM | POA: Diagnosis not present

## 2019-09-03 DIAGNOSIS — I340 Nonrheumatic mitral (valve) insufficiency: Secondary | ICD-10-CM | POA: Diagnosis not present

## 2019-09-03 DIAGNOSIS — I5022 Chronic systolic (congestive) heart failure: Secondary | ICD-10-CM | POA: Diagnosis not present

## 2019-09-03 LAB — BASIC METABOLIC PANEL
Anion gap: 7 (ref 5–15)
BUN: 27 mg/dL — ABNORMAL HIGH (ref 8–23)
CO2: 24 mmol/L (ref 22–32)
Calcium: 9.5 mg/dL (ref 8.9–10.3)
Chloride: 108 mmol/L (ref 98–111)
Creatinine, Ser: 1.97 mg/dL — ABNORMAL HIGH (ref 0.61–1.24)
GFR calc Af Amer: 40 mL/min — ABNORMAL LOW (ref >60)
GFR calc non Af Amer: 34 mL/min — ABNORMAL LOW (ref >60)
Glucose, Bld: 100 mg/dL — ABNORMAL HIGH (ref 70–99)
Potassium: 4.8 mmol/L (ref 3.5–5.1)
Sodium: 139 mmol/L (ref 135–145)

## 2019-09-03 NOTE — Patient Instructions (Signed)
Labs done today. We will contact you only if your labs are abnormal.  STOP TAKING Plavix.  No other medication changes were made at this time please continue all other medications as prescribed.   Your physician recommends that you schedule a follow-up appointment in: 6 months with an echo prior to your appointment. Please contact our office in august to schedule an appointment as well as your echo appointment.   Your physician has requested that you have an echocardiogram. Echocardiography is a painless test that uses sound waves to create images of your heart. It provides your doctor with information about the size and shape of your heart and how well your heart's chambers and valves are working. This procedure takes approximately one hour. There are no restrictions for this procedure.  At the [facility], you and your health needs are our priority. As part of our continuing mission to provide you with exceptional heart care, we have created designated Provider Care Teams. These Care Teams include your primary Cardiologist (physician) and Advanced Practice Providers (APPs- Physician Assistants and Nurse Practitioners) who all work together to provide you with the care you need, when you need it.   You may see any of the following providers on your designated Care Team at your next follow up: Marland Kitchen Dr Glori Bickers . Dr Loralie Champagne . Darrick Grinder, NP . Lyda Jester, PA . Audry Riles, PharmD   Please be sure to bring in all your medications bottles to every appointment.

## 2019-09-03 NOTE — Progress Notes (Signed)
Gerald Nelson in clinic today who was alert and oriented with no acute complaints. Gerald Nelson was seen by Dr. Aundra Dubin today who advised patient to decrease his smoking use. Dr. Aundra Dubin also advised patient to STOP PLAVIX and continue ASA daily. Patient will need ECHO in 6 months. Dr. Aundra Dubin suggested COVID vaccine for Gerald Nelson. Gerald Nelson agreed. I will be assisting in scheduling same. Medications were confirmed and reviewed. Pill box was filled accordingly. I will follow up with patient next Tuesday. Visit complete.   REFILLS: Bidil   Weight today: 148lbs

## 2019-09-04 NOTE — Progress Notes (Signed)
Date:  09/04/2019   ID:  [name], DOB 04/22/1951, MRN [id number]   Provider location: [facility] Type of Visit: Established patient   PCP:  [name], MD  Cardiologist:  Dr. Aundra Dubin  Chief Complaint: Shortness of breath   History of Present Illness: Gerald Nelson is a 69 y.o. male who has a past medical history of chronic systolic CHF (EF 0000000 in July 2018), severe MR/TR, pulmonary HTN, CVA, CKD, schizophrenia, HTN and tobacco abuse.   Mr. Center was admitted to [facility] 7/2-01/06/17 for newly diagnosed acute systolic CHF. Echo showed LVEF 15%, severe global hypokinesis, moderate LVH, coarsetrabeculation of the LV apex with numerous false tendinae of the left ventricle, very stagnant blood flow at the LV apex withsmoke but no obvious LV thrombus - Definity contrast was given,again, noting stagnant apical blood flow - this could suggestrecent thrombus and certainly high risk for apical thrombusformation. Other findings include aortic sclerosis with mild AI,moderate to severe MR, moderate LAE, mild RAE, moderate to severeTR, moderate to severe pulmonary hypertension (RVSP 73 mmHg), dilated IVC, trivial posterior pericardial effusion.   He was admitted again in 10/18. He diuresed 25 pounds on IV lasix. He refused cath. His clonidine and diltazem were stopped during this admission. Referred to paramedicine. Discharge weight 133 pounds.   Echo 08/11/17 LVEF 15-20%, no MR noted.   Echo in 7/20 showed improvement in EF to 50-55%, moderate LVH, normal RV size and systolic function.   He presents today for followup of CHF.  He is still smoking, has cut back some.  No chest pain.  No significant exertional dyspnea.  No orthopnea/PND.  No lightheadedness.      Labs (6/19): K 3.8, creatinine 2.2 Labs (11/19): K 4.1, creatinine 1.95, LDL 54, HDl 54 Labs (12/19): K 4.6, creatinine 1.89 LabS (2/20): K 4.5, creatinine 1.68  Labs (4/20): K 4.5, creatinine  1.58 Labs (9/20): K 5.1, creatinine 2.02 Labs (10/20): LDL 67, K 4.8, creatinine 1.92  PMH: 1. Chronic systolic CHF:  Cardiomyopathy of uncertain etiology, refused cath. He also refused ICD.  - Echo (7/18): Moderate LVH, severe FBSH, trabeculation at apex not meeting criteria for noncompaction, EF 15%, moderate to severe MR, low normal RV systolic function, moderate-severe TR.  - Echo (2/19): Moderate LVH, EF 15-20%, No mitral regurgitation, normal RV size and systolic function, mild TR.  - Echo (7/20): EF 50-55%, moderate LVH, normal RV size and systolic function.  2. H/o CVA 3. Schizophrenia 4. CKD stage 3 5. Mitral regurgitation: Suspect functional. Moderate-severe MR on 7/18 echo, minimal MR on 2/19 echo.  6. COPD: He is an active smoker.  - PFTs (12/18) suggestive of severe COPD.  7. H/o HTN 8. ? apical mural thrombus: Not seen on most recent echo in 2/19.  9. CAD: 1/19 CT chest showed coronary calcification.   Current Outpatient Medications  Medication Sig Dispense Refill  . albuterol (PROVENTIL HFA;VENTOLIN HFA) 108 (90 Base) MCG/ACT inhaler Inhale 1-2 puffs into the lungs every 6 (six) hours as needed for wheezing or shortness of breath.    Marland Kitchen aspirin 81 MG EC tablet TAKE 1 TABLET BY MOUTH EVERY DAY 30 tablet 1  . atorvastatin (LIPITOR) 40 MG tablet TAKE ONE TABLET BY MOUTH EVERY DAY IN THE EVENING 30 tablet 6  . bisoprolol (ZEBETA) 10 MG tablet TAKE 1 TABLET (10 MG TOTAL) BY MOUTH DAILY. 90 tablet 1  . Fluticasone-Umeclidin-Vilant (TRELEGY ELLIPTA) 100-62.5-25 MCG/INH AEPB Inhale 1 puff into the  lungs daily. 2 each 0  . furosemide (LASIX) 40 MG tablet Take 0.5 tablets (20 mg total) by mouth daily. 30 tablet 6  . haloperidol decanoate (HALDOL DECANOATE) 100 MG/ML injection INJECT 1 ML EVERY 4 WEEKS    . isosorbide-hydrALAZINE (BIDIL) 20-37.5 MG tablet Take 1 tablet by mouth 3 (three) times daily. 180 tablet 6  . losartan (COZAAR) 25 MG tablet TAKE 1 TABLET (25 MG TOTAL) BY MOUTH  2 (TWO) TIMES DAILY. 180 tablet 3  . potassium chloride SA (K-DUR) 20 MEQ tablet TAKE ONE TABLET BY MOUTH ONCE DAILY (AM) 30 tablet 6  . spironolactone (ALDACTONE) 25 MG tablet TAKE ONE TABLET BY MOUTH ONCE DAILY (BEDTIME) 30 tablet 5   Current Facility-Administered Medications  Medication Dose Route Frequency Provider Last Rate Last Admin  . methylPREDNISolone acetate (DEPO-MEDROL) injection 40 mg  40 mg Intra-articular Once [name], [name], MD        Allergies:   Sulfa antibiotics   Social History:  The patient  reports that he has been smoking cigarettes. He started smoking about 47 years ago. He has a 66.00 pack-year smoking history. He has never used smokeless tobacco. He reports that he does not drink alcohol or use drugs.   Family History:  The patient's family history includes Hypertension in his father and mother.   ROS:  Please see the history of present illness.   All other systems are personally reviewed and negative.   Exam:   BP 130/90   Pulse 78   Wt 67.4 kg (148 lb 9.6 oz)   SpO2 99%   BMI 21.32 kg/m  General: NAD Neck: No JVD, no thyromegaly or thyroid nodule.  Lungs: Rhonchi bilaterally CV: Nondisplaced PMI.  Heart regular S1/S2, no S3/S4, no murmur.  No peripheral edema.  No carotid bruit.  Normal pedal pulses.  Abdomen: Soft, nontender, no hepatosplenomegaly, no distention.  Skin: Intact without lesions or rashes.  Neurologic: Alert and oriented x 3.  Psych: Normal affect. Extremities: No clubbing or cyanosis.  HEENT: Normal.   Recent Labs: 03/31/2019: ALT 23; B Natriuretic Peptide 55.2; Hemoglobin 13.8; Platelets 194 09/03/2019: BUN 27; Creatinine, Ser 1.97; Potassium 4.8; Sodium 139  Personally reviewed   Wt Readings from Last 3 Encounters:  09/03/19 67.4 kg (148 lb 9.6 oz)  08/28/19 66.7 kg (147 lb)  08/21/19 70.2 kg (154 lb 12.8 oz)      ASSESSMENT AND PLAN:  1. Chronic systolic CHF: Echo 99991111 EF 15% with moderate-severe MR. Echo 08/11/17 LVEF  15-20%, no MR noted.  Cardiomyopathy of uncertain etiology, he has refused cath in the past.  Echo in 7/20 showed improvement in EF to 50-55%.  NYHA class II symptoms.  Creatinine 1.9 at last check.  BP stable.  He looks euvolemic.  - He has been reluctant to have heart cath, think we can hold off for now with improvement in EF.  - Continue Lasix 20 mg daily, BMET today.   - Continue bisoprolol 10 mg daily.   - Continue losartan 25 mg bid.  - Continue spironolactone 25 mg daily.  - Continue Bidil 1 tab tid.  - He is out of ICD range now, refused ICD in past.   - Repeat echo at 6 month followup.  2. Questionable apical thrombus: Not seen on 7/20 echo. He is not anticoagulated.  3. Mitral regurgitation: Moderate to severe on 7/18 echo but trivial on 7/20 echo.  Suspect functional MR, improved with improved LV function.  - I will arrange repeat  echo at 6 month followup.  4. COPD: He continues to smoke.  Severe COPD by prior PFTs.  - I again encouraged him to quit, he is trying to cut back.    5. CKD stage III: BMET today.   6. Schizophrenia: Affects compliance with medical care.  Continue paramedicine, this seems to be working well.  7. CAD: Noted on CT chest.    - Continue ASA 81.  - Continue atorvastatin, good lipids in 10/20.   - As above, recommended cath in past for diagnosis of CAD but he refused.  Think reasonable to hold off now with improved EF and no chest pain.    Followup in 6 months with echo.   Signed, Loralie Champagne, MD  09/04/2019  [place] [address] [facility] [place] [address] [phone] (office) [phone] (fax)

## 2019-09-11 ENCOUNTER — Other Ambulatory Visit (HOSPITAL_COMMUNITY): Payer: Self-pay

## 2019-09-11 NOTE — Progress Notes (Signed)
Paramedicine Encounter    Patient ID: Gerald Nelson, male    DOB: 04/12/1951, 69 y.o.   MRN: [id number]   Patient Care Team: [name], MD as PCP - General (Internal Medicine) [name], MD as PCP - Advanced Heart Failure (Cardiology) [name], LCSW as Social Worker (Licensed Clinical Social Worker)  Patient Active Problem List   Diagnosis Date Noted  . Stage 3 severe COPD by GOLD classification ([place]) 11/14/2018  . Noncompliance 04/23/2017  . Mitral regurgitation 04/23/2017  . Thrombus - possible apical thrombus 01/2017 04/23/2017  . Hyperlipidemia 04/23/2017  . Pulmonary hypertension ([place])   . SOB (shortness of breath)   . Palliative care by specialist   . Tobacco abuse 04/19/2017  . Acute respiratory failure with hypoxia ([place]) 01/27/2017  . Hypertensive urgency 01/27/2017  . CHF (congestive heart failure) ([place]) 01/26/2017  . CKD (chronic kidney disease), stage III 01/04/2017  . Essential hypertension 01/04/2017  . CVA (cerebral infarction) 12/12/2013  . Hyponatremia 12/06/2012  . Hiatal hernia 12/06/2012  . Syncope 12/06/2012  . Schizophrenia ([place])     Current Outpatient Medications:  .  albuterol (PROVENTIL HFA;VENTOLIN HFA) 108 (90 Base) MCG/ACT inhaler, Inhale 1-2 puffs into the lungs every 6 (six) hours as needed for wheezing or shortness of breath., Disp: , Rfl:  .  aspirin 81 MG EC tablet, TAKE 1 TABLET BY MOUTH EVERY DAY, Disp: 30 tablet, Rfl: 1 .  atorvastatin (LIPITOR) 40 MG tablet, TAKE ONE TABLET BY MOUTH EVERY DAY IN THE EVENING, Disp: 30 tablet, Rfl: 6 .  bisoprolol (ZEBETA) 10 MG tablet, TAKE 1 TABLET (10 MG TOTAL) BY MOUTH DAILY., Disp: 90 tablet, Rfl: 1 .  Fluticasone-Umeclidin-Vilant (TRELEGY ELLIPTA) 100-62.5-25 MCG/INH AEPB, Inhale 1 puff into the lungs daily., Disp: 2 each, Rfl: 0 .  haloperidol decanoate (HALDOL DECANOATE) 100 MG/ML injection, INJECT 1 ML EVERY 4 WEEKS, Disp: , Rfl:  .  isosorbide-hydrALAZINE (BIDIL) 20-37.5 MG tablet,  Take 1 tablet by mouth 3 (three) times daily., Disp: 180 tablet, Rfl: 6 .  losartan (COZAAR) 25 MG tablet, TAKE 1 TABLET (25 MG TOTAL) BY MOUTH 2 (TWO) TIMES DAILY., Disp: 180 tablet, Rfl: 3 .  potassium chloride SA (K-DUR) 20 MEQ tablet, TAKE ONE TABLET BY MOUTH ONCE DAILY (AM), Disp: 30 tablet, Rfl: 6 .  spironolactone (ALDACTONE) 25 MG tablet, TAKE ONE TABLET BY MOUTH ONCE DAILY (BEDTIME), Disp: 30 tablet, Rfl: 5 .  furosemide (LASIX) 40 MG tablet, Take 0.5 tablets (20 mg total) by mouth daily. (Patient not taking: Reported on 09/11/2019), Disp: 30 tablet, Rfl: 6  Current Facility-Administered Medications:  .  methylPREDNISolone acetate (DEPO-MEDROL) injection 40 mg, 40 mg, Intra-articular, Once, [name], [name], MD Allergies  Allergen Reactions  . Sulfa Antibiotics Nausea Only     Social History   Socioeconomic History  . Marital status: Single    Spouse name: Not on file  . Number of children: Not on file  . Years of education: Not on file  . Highest education level: Not on file  Occupational History  . Not on file  Tobacco Use  . Smoking status: Current Every Day Smoker    Packs/day: 1.50    Years: 44.00    Pack years: 66.00    Types: Cigarettes    Start date: 101  . Smokeless tobacco: Never Used  . Tobacco comment: 1ppd as of 12/06/17 ep  Substance and Sexual Activity  . Alcohol use: No  . Drug use: No  . Sexual activity: Not  on file  Other Topics Concern  . Not on file  Social History Narrative  . Not on file   Social Determinants of Health   Financial Resource Strain:   . Difficulty of Paying Living Expenses: Not on file  Food Insecurity:   . Worried About Charity fundraiser in the Last Year: Not on file  . Ran Out of Food in the Last Year: Not on file  Transportation Needs:   . Lack of Transportation (Medical): Not on file  . Lack of Transportation (Non-Medical): Not on file  Physical Activity:   . Days of Exercise per Week: Not on file  . Minutes of  Exercise per Session: Not on file  Stress:   . Feeling of Stress : Not on file  Social Connections:   . Frequency of Communication with Friends and Family: Not on file  . Frequency of Social Gatherings with Friends and Family: Not on file  . Attends Religious Services: Not on file  . Active Member of Clubs or Organizations: Not on file  . Attends Archivist Meetings: Not on file  . Marital Status: Not on file  Intimate Partner Violence:   . Fear of Current or Ex-Partner: Not on file  . Emotionally Abused: Not on file  . Physically Abused: Not on file  . Sexually Abused: Not on file    Physical Exam Constitutional:      Appearance: He is normal weight.  HENT:     Head: Normocephalic.     Nose: Rhinorrhea present.     Mouth/Throat:     Mouth: Mucous membranes are moist.     Pharynx: Oropharynx is clear.  Eyes:     Pupils: Pupils are equal, round, and reactive to light.  Cardiovascular:     Rate and Rhythm: Normal rate and regular rhythm.     Pulses: Normal pulses.  Pulmonary:     Effort: Pulmonary effort is normal.  Abdominal:     General: Abdomen is flat.     Palpations: Abdomen is soft.  Musculoskeletal:        General: Normal range of motion.     Cervical back: Normal range of motion.     Right lower leg: No edema.     Left lower leg: No edema.  Skin:    General: Skin is warm and dry.     Capillary Refill: Capillary refill takes less than 2 seconds.  Neurological:     Mental Status: He is alert. Mental status is at baseline.  Psychiatric:        Mood and Affect: Mood normal.    Arrived for home visit for Jamone who was standing outside with his roommate alert and oriented appearing to be in no obvious distress. Kashden denies shortness of breath, dizziness, chest pain. Vitals were obtained and are as noted. Patient noted to have productive cough, patient explained he is still smoking a lot daily. Patient was encouraged to decrease same. Medications were  reviewed and are as noted in report. Viola stated he feels "fine". Pill box was filled and home visit was completed. Will follow up in one week.   -Refills: -Atorvastatin         No future appointments.   ACTION: Home visit completed Next visit planned for one week

## 2019-09-18 ENCOUNTER — Other Ambulatory Visit (HOSPITAL_COMMUNITY): Payer: Self-pay

## 2019-09-18 NOTE — Progress Notes (Signed)
Paramedicine Encounter    Patient ID: Gerald Nelson, male    DOB: 03/16/1951, 69 y.o.   MRN: [id number]   Patient Care Team: [name], MD as PCP - General (Internal Medicine) [name], MD as PCP - Advanced Heart Failure (Cardiology) [name], LCSW as Social Worker (Licensed Clinical Social Worker)  Patient Active Problem List   Diagnosis Date Noted  . Stage 3 severe COPD by GOLD classification ([place]) 11/14/2018  . Noncompliance 04/23/2017  . Mitral regurgitation 04/23/2017  . Thrombus - possible apical thrombus 01/2017 04/23/2017  . Hyperlipidemia 04/23/2017  . Pulmonary hypertension ([place])   . SOB (shortness of breath)   . Palliative care by specialist   . Tobacco abuse 04/19/2017  . Acute respiratory failure with hypoxia ([place]) 01/27/2017  . Hypertensive urgency 01/27/2017  . CHF (congestive heart failure) ([place]) 01/26/2017  . CKD (chronic kidney disease), stage III 01/04/2017  . Essential hypertension 01/04/2017  . CVA (cerebral infarction) 12/12/2013  . Hyponatremia 12/06/2012  . Hiatal hernia 12/06/2012  . Syncope 12/06/2012  . Schizophrenia ([place])     Current Outpatient Medications:  .  albuterol (PROVENTIL HFA;VENTOLIN HFA) 108 (90 Base) MCG/ACT inhaler, Inhale 1-2 puffs into the lungs every 6 (six) hours as needed for wheezing or shortness of breath., Disp: , Rfl:  .  aspirin 81 MG EC tablet, TAKE 1 TABLET BY MOUTH EVERY DAY, Disp: 30 tablet, Rfl: 1 .  atorvastatin (LIPITOR) 40 MG tablet, TAKE ONE TABLET BY MOUTH EVERY DAY IN THE EVENING, Disp: 30 tablet, Rfl: 6 .  bisoprolol (ZEBETA) 10 MG tablet, TAKE 1 TABLET (10 MG TOTAL) BY MOUTH DAILY., Disp: 90 tablet, Rfl: 1 .  Fluticasone-Umeclidin-Vilant (TRELEGY ELLIPTA) 100-62.5-25 MCG/INH AEPB, Inhale 1 puff into the lungs daily., Disp: 2 each, Rfl: 0 .  furosemide (LASIX) 40 MG tablet, Take 0.5 tablets (20 mg total) by mouth daily., Disp: 30 tablet, Rfl: 6 .  haloperidol decanoate (HALDOL DECANOATE) 100 MG/ML  injection, INJECT 1 ML EVERY 4 WEEKS, Disp: , Rfl:  .  isosorbide-hydrALAZINE (BIDIL) 20-37.5 MG tablet, Take 1 tablet by mouth 3 (three) times daily., Disp: 180 tablet, Rfl: 6 .  losartan (COZAAR) 25 MG tablet, TAKE 1 TABLET (25 MG TOTAL) BY MOUTH 2 (TWO) TIMES DAILY., Disp: 180 tablet, Rfl: 3 .  potassium chloride SA (K-DUR) 20 MEQ tablet, TAKE ONE TABLET BY MOUTH ONCE DAILY (AM), Disp: 30 tablet, Rfl: 6 .  spironolactone (ALDACTONE) 25 MG tablet, TAKE ONE TABLET BY MOUTH ONCE DAILY (BEDTIME), Disp: 30 tablet, Rfl: 5  Current Facility-Administered Medications:  .  methylPREDNISolone acetate (DEPO-MEDROL) injection 40 mg, 40 mg, Intra-articular, Once, [name], [name], MD Allergies  Allergen Reactions  . Sulfa Antibiotics Nausea Only     Social History   Socioeconomic History  . Marital status: Single    Spouse name: Not on file  . Number of children: Not on file  . Years of education: Not on file  . Highest education level: Not on file  Occupational History  . Not on file  Tobacco Use  . Smoking status: Current Every Day Smoker    Packs/day: 1.50    Years: 44.00    Pack years: 66.00    Types: Cigarettes    Start date: 36  . Smokeless tobacco: Never Used  . Tobacco comment: 1ppd as of 12/06/17 ep  Substance and Sexual Activity  . Alcohol use: No  . Drug use: No  . Sexual activity: Not on file  Other Topics Concern  .  Not on file  Social History Narrative  . Not on file   Social Determinants of Health   Financial Resource Strain:   . Difficulty of Paying Living Expenses:   Food Insecurity:   . Worried About Charity fundraiser in the Last Year:   . Arboriculturist in the Last Year:   Transportation Needs:   . Film/video editor (Medical):   Marland Kitchen Lack of Transportation (Non-Medical):   Physical Activity:   . Days of Exercise per Week:   . Minutes of Exercise per Session:   Stress:   . Feeling of Stress :   Social Connections:   . Frequency of Communication with  Friends and Family:   . Frequency of Social Gatherings with Friends and Family:   . Attends Religious Services:   . Active Member of Clubs or Organizations:   . Attends Archivist Meetings:   Marland Kitchen Marital Status:   Intimate Partner Violence:   . Fear of Current or Ex-Partner:   . Emotionally Abused:   Marland Kitchen Physically Abused:   . Sexually Abused:     Physical Exam Vitals reviewed.  Constitutional:      Appearance: He is normal weight.  HENT:     Head: Normocephalic.     Nose: Nose normal.     Mouth/Throat:     Mouth: Mucous membranes are moist.  Eyes:     Pupils: Pupils are equal, round, and reactive to light.  Cardiovascular:     Rate and Rhythm: Normal rate and regular rhythm.     Pulses: Normal pulses.     Heart sounds: Normal heart sounds.  Pulmonary:     Effort: Pulmonary effort is normal.     Breath sounds: Wheezing present.  Abdominal:     General: Abdomen is flat.     Palpations: Abdomen is soft.  Musculoskeletal:        General: Normal range of motion.     Right lower leg: No edema.     Left lower leg: No edema.  Skin:    General: Skin is warm and dry.     Capillary Refill: Capillary refill takes less than 2 seconds.  Neurological:     Mental Status: He is alert. Mental status is at baseline.  Psychiatric:        Mood and Affect: Mood normal.     Arrived for home visit for Gorje who was alert and oriented to his baseline upon my arrival. Thelmer had no reports of pain, shortness of breath more than normal, trouble walking, sleeping or eating. Vitals were obtained and are as noted. Assessment as noted. Medications reviewed and confirmed. Pill box filled accordingly. Patient weight today at 143.2lbs  Last weight was at 148lbs   Home visit complete, will see next Tuesday.    Refills:  Potassium        No future appointments.   ACTION: Home visit completed Next visit planned for Tuesday 3/23

## 2019-09-25 ENCOUNTER — Other Ambulatory Visit (HOSPITAL_COMMUNITY): Payer: Self-pay

## 2019-09-25 NOTE — Progress Notes (Signed)
Paramedicine Encounter    Patient ID: Gerald Nelson, male    DOB: 03/16/1951, 69 y.o.   MRN: [id number]   Patient Care Team: [name], MD as PCP - General (Internal Medicine) [name], MD as PCP - Advanced Heart Failure (Cardiology) [name], LCSW as Social Worker (Licensed Clinical Social Worker)  Patient Active Problem List   Diagnosis Date Noted  . Stage 3 severe COPD by GOLD classification ([place]) 11/14/2018  . Noncompliance 04/23/2017  . Mitral regurgitation 04/23/2017  . Thrombus - possible apical thrombus 01/2017 04/23/2017  . Hyperlipidemia 04/23/2017  . Pulmonary hypertension ([place])   . SOB (shortness of breath)   . Palliative care by specialist   . Tobacco abuse 04/19/2017  . Acute respiratory failure with hypoxia ([place]) 01/27/2017  . Hypertensive urgency 01/27/2017  . CHF (congestive heart failure) ([place]) 01/26/2017  . CKD (chronic kidney disease), stage III 01/04/2017  . Essential hypertension 01/04/2017  . CVA (cerebral infarction) 12/12/2013  . Hyponatremia 12/06/2012  . Hiatal hernia 12/06/2012  . Syncope 12/06/2012  . Schizophrenia ([place])     Current Outpatient Medications:  .  albuterol (PROVENTIL HFA;VENTOLIN HFA) 108 (90 Base) MCG/ACT inhaler, Inhale 1-2 puffs into the lungs every 6 (six) hours as needed for wheezing or shortness of breath., Disp: , Rfl:  .  aspirin 81 MG EC tablet, TAKE 1 TABLET BY MOUTH EVERY DAY, Disp: 30 tablet, Rfl: 1 .  atorvastatin (LIPITOR) 40 MG tablet, TAKE ONE TABLET BY MOUTH EVERY DAY IN THE EVENING, Disp: 30 tablet, Rfl: 6 .  bisoprolol (ZEBETA) 10 MG tablet, TAKE 1 TABLET (10 MG TOTAL) BY MOUTH DAILY., Disp: 90 tablet, Rfl: 1 .  Fluticasone-Umeclidin-Vilant (TRELEGY ELLIPTA) 100-62.5-25 MCG/INH AEPB, Inhale 1 puff into the lungs daily., Disp: 2 each, Rfl: 0 .  furosemide (LASIX) 40 MG tablet, Take 0.5 tablets (20 mg total) by mouth daily., Disp: 30 tablet, Rfl: 6 .  haloperidol decanoate (HALDOL DECANOATE) 100 MG/ML  injection, INJECT 1 ML EVERY 4 WEEKS, Disp: , Rfl:  .  isosorbide-hydrALAZINE (BIDIL) 20-37.5 MG tablet, Take 1 tablet by mouth 3 (three) times daily., Disp: 180 tablet, Rfl: 6 .  losartan (COZAAR) 25 MG tablet, TAKE 1 TABLET (25 MG TOTAL) BY MOUTH 2 (TWO) TIMES DAILY., Disp: 180 tablet, Rfl: 3 .  potassium chloride SA (K-DUR) 20 MEQ tablet, TAKE ONE TABLET BY MOUTH ONCE DAILY (AM), Disp: 30 tablet, Rfl: 6 .  spironolactone (ALDACTONE) 25 MG tablet, TAKE ONE TABLET BY MOUTH ONCE DAILY (BEDTIME), Disp: 30 tablet, Rfl: 5  Current Facility-Administered Medications:  .  methylPREDNISolone acetate (DEPO-MEDROL) injection 40 mg, 40 mg, Intra-articular, Once, [name], [name], MD Allergies  Allergen Reactions  . Sulfa Antibiotics Nausea Only     Social History   Socioeconomic History  . Marital status: Single    Spouse name: Not on file  . Number of children: Not on file  . Years of education: Not on file  . Highest education level: Not on file  Occupational History  . Not on file  Tobacco Use  . Smoking status: Current Every Day Smoker    Packs/day: 1.50    Years: 44.00    Pack years: 66.00    Types: Cigarettes    Start date: 36  . Smokeless tobacco: Never Used  . Tobacco comment: 1ppd as of 12/06/17 ep  Substance and Sexual Activity  . Alcohol use: No  . Drug use: No  . Sexual activity: Not on file  Other Topics Concern  .  Not on file  Social History Narrative  . Not on file   Social Determinants of Health   Financial Resource Strain:   . Difficulty of Paying Living Expenses:   Food Insecurity:   . Worried About Charity fundraiser in the Last Year:   . Arboriculturist in the Last Year:   Transportation Needs:   . Film/video editor (Medical):   Marland Kitchen Lack of Transportation (Non-Medical):   Physical Activity:   . Days of Exercise per Week:   . Minutes of Exercise per Session:   Stress:   . Feeling of Stress :   Social Connections:   . Frequency of Communication with  Friends and Family:   . Frequency of Social Gatherings with Friends and Family:   . Attends Religious Services:   . Active Member of Clubs or Organizations:   . Attends Archivist Meetings:   Marland Kitchen Marital Status:   Intimate Partner Violence:   . Fear of Current or Ex-Partner:   . Emotionally Abused:   Marland Kitchen Physically Abused:   . Sexually Abused:     Physical Exam  Arrived for home visit for Gerald Nelson who was alert and oriented walking around his home with no complaints. Vitals obtained and assessment as noted. No edema or swelling. Lung sounds with slight wheezing due to chronic smoking. Patient encouraged to quit. He stated he wants to get some patches to assist with him to quit. Medications confirmed and verified. Pill box filled. No refills needed. Gerald Nelson stated he got his [facility] card and wants to be apart of the silver sneakers club. He had some questions about same, I will notify Gerald Nelson of same. Gerald Nelson stated he will be moving back to [address] next week. He  agreed to visit in one week.     No future appointments.   ACTION: Home visit completed Next visit planned for one week

## 2019-10-02 ENCOUNTER — Other Ambulatory Visit (HOSPITAL_COMMUNITY): Payer: Self-pay

## 2019-10-02 NOTE — Progress Notes (Signed)
Paramedicine Encounter    Patient ID: Gerald Nelson, male    DOB: [birth year], 69 y.o.   MRN: [id number]   Patient Care Team: [name], MD as PCP - General (Internal Medicine) [name], MD as PCP - Advanced Heart Failure (Cardiology) [name], LCSW as Social Worker (Licensed Clinical Social Worker)  Patient Active Problem List   Diagnosis Date Noted  . Stage 3 severe COPD by GOLD classification ([place]) 11/14/2018  . Noncompliance 04/23/2017  . Mitral regurgitation 04/23/2017  . Thrombus - possible apical thrombus 01/2017 04/23/2017  . Hyperlipidemia 04/23/2017  . Pulmonary hypertension ([place])   . SOB (shortness of breath)   . Palliative care by specialist   . Tobacco abuse 04/19/2017  . Acute respiratory failure with hypoxia ([place]) 01/27/2017  . Hypertensive urgency 01/27/2017  . CHF (congestive heart failure) ([place]) 01/26/2017  . CKD (chronic kidney disease), stage III 01/04/2017  . Essential hypertension 01/04/2017  . CVA (cerebral infarction) 12/12/2013  . Hyponatremia 12/06/2012  . Hiatal hernia 12/06/2012  . Syncope 12/06/2012  . Schizophrenia ([place])     Current Outpatient Medications:  .  albuterol (PROVENTIL HFA;VENTOLIN HFA) 108 (90 Base) MCG/ACT inhaler, Inhale 1-2 puffs into the lungs every 6 (six) hours as needed for wheezing or shortness of breath., Disp: , Rfl:  .  aspirin 81 MG EC tablet, TAKE 1 TABLET BY MOUTH EVERY DAY, Disp: 30 tablet, Rfl: 1 .  atorvastatin (LIPITOR) 40 MG tablet, TAKE ONE TABLET BY MOUTH EVERY DAY IN THE EVENING, Disp: 30 tablet, Rfl: 6 .  bisoprolol (ZEBETA) 10 MG tablet, TAKE 1 TABLET (10 MG TOTAL) BY MOUTH DAILY., Disp: 90 tablet, Rfl: 1 .  Fluticasone-Umeclidin-Vilant (TRELEGY ELLIPTA) 100-62.5-25 MCG/INH AEPB, Inhale 1 puff into the lungs daily., Disp: 2 each, Rfl: 0 .  haloperidol decanoate (HALDOL DECANOATE) 100 MG/ML injection, INJECT 1 ML EVERY 4 WEEKS, Disp: , Rfl:  .  isosorbide-hydrALAZINE (BIDIL) 20-37.5 MG tablet,  Take 1 tablet by mouth 3 (three) times daily., Disp: 180 tablet, Rfl: 6 .  losartan (COZAAR) 25 MG tablet, TAKE 1 TABLET (25 MG TOTAL) BY MOUTH 2 (TWO) TIMES DAILY., Disp: 180 tablet, Rfl: 3 .  potassium chloride SA (K-DUR) 20 MEQ tablet, TAKE ONE TABLET BY MOUTH ONCE DAILY (AM), Disp: 30 tablet, Rfl: 6 .  spironolactone (ALDACTONE) 25 MG tablet, TAKE ONE TABLET BY MOUTH ONCE DAILY (BEDTIME), Disp: 30 tablet, Rfl: 5 .  furosemide (LASIX) 40 MG tablet, Take 0.5 tablets (20 mg total) by mouth daily. (Patient not taking: Reported on 10/02/2019), Disp: 30 tablet, Rfl: 6  Current Facility-Administered Medications:  .  methylPREDNISolone acetate (DEPO-MEDROL) injection 40 mg, 40 mg, Intra-articular, Once, [name], [name], MD Allergies  Allergen Reactions  . Sulfa Antibiotics Nausea Only     Social History   Socioeconomic History  . Marital status: Single    Spouse name: Not on file  . Number of children: Not on file  . Years of education: Not on file  . Highest education level: Not on file  Occupational History  . Not on file  Tobacco Use  . Smoking status: Current Every Day Smoker    Packs/day: 1.50    Years: 44.00    Pack years: 66.00    Types: Cigarettes    Start date: 57  . Smokeless tobacco: Never Used  . Tobacco comment: 1ppd as of 12/06/17 ep  Substance and Sexual Activity  . Alcohol use: No  . Drug use: No  . Sexual activity: Not  on file  Other Topics Concern  . Not on file  Social History Narrative  . Not on file   Social Determinants of Health   Financial Resource Strain:   . Difficulty of Paying Living Expenses:   Food Insecurity:   . Worried About Charity fundraiser in the Last Year:   . Arboriculturist in the Last Year:   Transportation Needs:   . Film/video editor (Medical):   Marland Kitchen Lack of Transportation (Non-Medical):   Physical Activity:   . Days of Exercise per Week:   . Minutes of Exercise per Session:   Stress:   . Feeling of Stress :   Social  Connections:   . Frequency of Communication with Friends and Family:   . Frequency of Social Gatherings with Friends and Family:   . Attends Religious Services:   . Active Member of Clubs or Organizations:   . Attends Archivist Meetings:   Marland Kitchen Marital Status:   Intimate Partner Violence:   . Fear of Current or Ex-Partner:   . Emotionally Abused:   Marland Kitchen Physically Abused:   . Sexually Abused:     Physical Exam Vitals reviewed.  Constitutional:      Appearance: He is normal weight.  HENT:     Head: Normocephalic.     Nose: Nose normal.     Mouth/Throat:     Mouth: Mucous membranes are moist.  Eyes:     Pupils: Pupils are equal, round, and reactive to light.  Cardiovascular:     Rate and Rhythm: Normal rate and regular rhythm.     Pulses: Normal pulses.     Heart sounds: Normal heart sounds.  Pulmonary:     Effort: Pulmonary effort is normal.     Breath sounds: Wheezing present.  Abdominal:     General: Abdomen is flat.     Palpations: Abdomen is soft.  Musculoskeletal:        General: Normal range of motion.     Cervical back: Normal range of motion.     Right lower leg: No edema.     Left lower leg: No edema.  Skin:    General: Skin is warm and dry.     Capillary Refill: Capillary refill takes less than 2 seconds.  Neurological:     Mental Status: Mental status is at baseline.  Psychiatric:        Mood and Affect: Mood normal.      Arrived for home visit for Gerald Nelson who was alert and oriented with no complaints today. Gerald Nelson actively smoking outside he reports cutting down to 1/2 pack daily. I continued to encourage him to decrease the amount he is smoking. Her understood. Vitals were obtained. Lung sounds normal for patient with wheezing and productive cough. Gerald Nelson was compliant with most medications this week. Medicines were reviewed and pill box filled accordingly. Gerald Nelson spoke of interest in getting the Covid Vaccine, Gerald Nelson will be reaching out for same. Home  visit complete. I will see patient in one week.   Weight- 144.2lbs   Refills: NONE  Future Appointments  Date Time Provider [place]  10/29/2019  1:00 PM [facility] 1 [facility]     ACTION: Home visit completed Next visit planned for one week

## 2019-10-03 ENCOUNTER — Telehealth (HOSPITAL_COMMUNITY): Payer: Self-pay | Admitting: Licensed Clinical Social Worker

## 2019-10-03 NOTE — Telephone Encounter (Signed)
CSW called pt to see if they have received or been scheduled to receive the COVID-19 vaccine at this time.  Pt is interested in getting vaccine and would like assistance in setting up  CSW scheduled with [facility] location for Saturday April 3rd at 9:45am- pt informed of appt.and provided with instructions  No further needs at this time  [name], [facility] Desk#: [phone] Cell#: [phone]

## 2019-10-06 ENCOUNTER — Ambulatory Visit: Payer: Medicare Other | Attending: Internal Medicine

## 2019-10-06 DIAGNOSIS — Z23 Encounter for immunization: Secondary | ICD-10-CM

## 2019-10-06 NOTE — Progress Notes (Signed)
   Covid-19 Vaccination Clinic  Name:  MILDRED BOLLARD    MRN: [id number] DOB: 1950/08/05  10/06/2019  Mr. Zaring was observed post Covid-19 immunization for 15 minutes without incident. He was provided with Vaccine Information Sheet and instruction to access the V-Safe system.   Mr. Macomber was instructed to call 911 with any severe reactions post vaccine: Marland Kitchen Difficulty breathing  . Swelling of face and throat  . A fast heartbeat  . A bad rash all over body  . Dizziness and weakness   Immunizations Administered    Name Date Dose VIS Date Route   Pfizer COVID-19 Vaccine 10/06/2019  9:27 AM 0.3 mL 06/15/2019 Intramuscular   Manufacturer: [place]   Lot: [id number]   [place]: [address]-3

## 2019-10-09 ENCOUNTER — Other Ambulatory Visit (HOSPITAL_COMMUNITY): Payer: Self-pay

## 2019-10-09 NOTE — Progress Notes (Signed)
Paramedicine Encounter    Patient ID: Gerald Nelson, male    DOB: 03/16/1951, 69 y.o.   MRN: [id number]   Patient Care Team: [name], MD as PCP - General (Internal Medicine) [name], MD as PCP - Advanced Heart Failure (Cardiology) [name], LCSW as Social Worker (Licensed Clinical Social Worker)  Patient Active Problem List   Diagnosis Date Noted  . Stage 3 severe COPD by GOLD classification ([place]) 11/14/2018  . Noncompliance 04/23/2017  . Mitral regurgitation 04/23/2017  . Thrombus - possible apical thrombus 01/2017 04/23/2017  . Hyperlipidemia 04/23/2017  . Pulmonary hypertension ([place])   . SOB (shortness of breath)   . Palliative care by specialist   . Tobacco abuse 04/19/2017  . Acute respiratory failure with hypoxia ([place]) 01/27/2017  . Hypertensive urgency 01/27/2017  . CHF (congestive heart failure) ([place]) 01/26/2017  . CKD (chronic kidney disease), stage III 01/04/2017  . Essential hypertension 01/04/2017  . CVA (cerebral infarction) 12/12/2013  . Hyponatremia 12/06/2012  . Hiatal hernia 12/06/2012  . Syncope 12/06/2012  . Schizophrenia ([place])     Current Outpatient Medications:  .  albuterol (PROVENTIL HFA;VENTOLIN HFA) 108 (90 Base) MCG/ACT inhaler, Inhale 1-2 puffs into the lungs every 6 (six) hours as needed for wheezing or shortness of breath., Disp: , Rfl:  .  aspirin 81 MG EC tablet, TAKE 1 TABLET BY MOUTH EVERY DAY, Disp: 30 tablet, Rfl: 1 .  atorvastatin (LIPITOR) 40 MG tablet, TAKE ONE TABLET BY MOUTH EVERY DAY IN THE EVENING, Disp: 30 tablet, Rfl: 6 .  bisoprolol (ZEBETA) 10 MG tablet, TAKE 1 TABLET (10 MG TOTAL) BY MOUTH DAILY., Disp: 90 tablet, Rfl: 1 .  Fluticasone-Umeclidin-Vilant (TRELEGY ELLIPTA) 100-62.5-25 MCG/INH AEPB, Inhale 1 puff into the lungs daily., Disp: 2 each, Rfl: 0 .  furosemide (LASIX) 40 MG tablet, Take 0.5 tablets (20 mg total) by mouth daily., Disp: 30 tablet, Rfl: 6 .  haloperidol decanoate (HALDOL DECANOATE) 100 MG/ML  injection, INJECT 1 ML EVERY 4 WEEKS, Disp: , Rfl:  .  isosorbide-hydrALAZINE (BIDIL) 20-37.5 MG tablet, Take 1 tablet by mouth 3 (three) times daily., Disp: 180 tablet, Rfl: 6 .  losartan (COZAAR) 25 MG tablet, TAKE 1 TABLET (25 MG TOTAL) BY MOUTH 2 (TWO) TIMES DAILY., Disp: 180 tablet, Rfl: 3 .  potassium chloride SA (K-DUR) 20 MEQ tablet, TAKE ONE TABLET BY MOUTH ONCE DAILY (AM), Disp: 30 tablet, Rfl: 6 .  spironolactone (ALDACTONE) 25 MG tablet, TAKE ONE TABLET BY MOUTH ONCE DAILY (BEDTIME), Disp: 30 tablet, Rfl: 5  Current Facility-Administered Medications:  .  methylPREDNISolone acetate (DEPO-MEDROL) injection 40 mg, 40 mg, Intra-articular, Once, [name], [name], MD Allergies  Allergen Reactions  . Sulfa Antibiotics Nausea Only     Social History   Socioeconomic History  . Marital status: Single    Spouse name: Not on file  . Number of children: Not on file  . Years of education: Not on file  . Highest education level: Not on file  Occupational History  . Not on file  Tobacco Use  . Smoking status: Current Every Day Smoker    Packs/day: 1.50    Years: 44.00    Pack years: 66.00    Types: Cigarettes    Start date: 36  . Smokeless tobacco: Never Used  . Tobacco comment: 1ppd as of 12/06/17 ep  Substance and Sexual Activity  . Alcohol use: No  . Drug use: No  . Sexual activity: Not on file  Other Topics Concern  .  Not on file  Social History Narrative  . Not on file   Social Determinants of Health   Financial Resource Strain:   . Difficulty of Paying Living Expenses:   Food Insecurity:   . Worried About Charity fundraiser in the Last Year:   . Arboriculturist in the Last Year:   Transportation Needs:   . Film/video editor (Medical):   Marland Kitchen Lack of Transportation (Non-Medical):   Physical Activity:   . Days of Exercise per Week:   . Minutes of Exercise per Session:   Stress:   . Feeling of Stress :   Social Connections:   . Frequency of Communication with  Friends and Family:   . Frequency of Social Gatherings with Friends and Family:   . Attends Religious Services:   . Active Member of Clubs or Organizations:   . Attends Archivist Meetings:   Marland Kitchen Marital Status:   Intimate Partner Violence:   . Fear of Current or Ex-Partner:   . Emotionally Abused:   Marland Kitchen Physically Abused:   . Sexually Abused:     Physical Exam Vitals reviewed.  Constitutional:      Appearance: He is normal weight.  HENT:     Head: Normocephalic.     Nose: Nose normal.     Mouth/Throat:     Mouth: Mucous membranes are moist.  Eyes:     Pupils: Pupils are equal, round, and reactive to light.  Cardiovascular:     Rate and Rhythm: Normal rate and regular rhythm.     Pulses: Normal pulses.     Heart sounds: Normal heart sounds.  Pulmonary:     Effort: Pulmonary effort is normal.     Breath sounds: Normal breath sounds.  Abdominal:     General: Abdomen is flat.     Palpations: Abdomen is soft.  Musculoskeletal:        General: Normal range of motion.     Cervical back: Normal range of motion and neck supple.     Right lower leg: No edema.     Left lower leg: No edema.  Skin:    General: Skin is warm and dry.     Capillary Refill: Capillary refill takes less than 2 seconds.  Neurological:     Mental Status: He is alert. Mental status is at baseline.  Psychiatric:        Mood and Affect: Mood normal.     Arrived for home visit for Mr. Jaquith who was alert and oriented walking to greet me in the drive way of his home. Mr. Marcum and I met outside today and he reports feeling good with no complaints. He denied shortness of breath, dizziness, chest pain or N/V/D. Vitals were obtained. Medications were reviewed and confirmed. Pill box filled accordingly. Mr. Schillaci stated he received his first dose of the [place] Vaccine a few days ago. He reports no side effects. Home visit complete. I will see Raybon in one week.   Refills: BIDIL  Weight- 137.9lbs      Future Appointments  Date Time Provider [place]  10/29/2019  1:00 PM [facility]-CT 1 [facility]-CT [facility]  10/30/2019  9:30 AM [facility]     ACTION: Home visit completed Next visit planned for one week

## 2019-10-16 ENCOUNTER — Other Ambulatory Visit (HOSPITAL_COMMUNITY): Payer: Self-pay

## 2019-10-16 NOTE — Progress Notes (Signed)
Paramedicine Encounter    Patient ID: Gerald Nelson, male    DOB: 03/16/1951, 69 y.o.   MRN: [id number]   Patient Care Team: [name], MD as PCP - General (Internal Medicine) [name], MD as PCP - Advanced Heart Failure (Cardiology) [name], LCSW as Social Worker (Licensed Clinical Social Worker)  Patient Active Problem List   Diagnosis Date Noted  . Stage 3 severe COPD by GOLD classification ([place]) 11/14/2018  . Noncompliance 04/23/2017  . Mitral regurgitation 04/23/2017  . Thrombus - possible apical thrombus 01/2017 04/23/2017  . Hyperlipidemia 04/23/2017  . Pulmonary hypertension ([place])   . SOB (shortness of breath)   . Palliative care by specialist   . Tobacco abuse 04/19/2017  . Acute respiratory failure with hypoxia ([place]) 01/27/2017  . Hypertensive urgency 01/27/2017  . CHF (congestive heart failure) ([place]) 01/26/2017  . CKD (chronic kidney disease), stage III 01/04/2017  . Essential hypertension 01/04/2017  . CVA (cerebral infarction) 12/12/2013  . Hyponatremia 12/06/2012  . Hiatal hernia 12/06/2012  . Syncope 12/06/2012  . Schizophrenia ([place])     Current Outpatient Medications:  .  albuterol (PROVENTIL HFA;VENTOLIN HFA) 108 (90 Base) MCG/ACT inhaler, Inhale 1-2 puffs into the lungs every 6 (six) hours as needed for wheezing or shortness of breath., Disp: , Rfl:  .  aspirin 81 MG EC tablet, TAKE 1 TABLET BY MOUTH EVERY DAY, Disp: 30 tablet, Rfl: 1 .  atorvastatin (LIPITOR) 40 MG tablet, TAKE ONE TABLET BY MOUTH EVERY DAY IN THE EVENING, Disp: 30 tablet, Rfl: 6 .  bisoprolol (ZEBETA) 10 MG tablet, TAKE 1 TABLET (10 MG TOTAL) BY MOUTH DAILY., Disp: 90 tablet, Rfl: 1 .  Fluticasone-Umeclidin-Vilant (TRELEGY ELLIPTA) 100-62.5-25 MCG/INH AEPB, Inhale 1 puff into the lungs daily., Disp: 2 each, Rfl: 0 .  furosemide (LASIX) 40 MG tablet, Take 0.5 tablets (20 mg total) by mouth daily., Disp: 30 tablet, Rfl: 6 .  haloperidol decanoate (HALDOL DECANOATE) 100 MG/ML  injection, INJECT 1 ML EVERY 4 WEEKS, Disp: , Rfl:  .  isosorbide-hydrALAZINE (BIDIL) 20-37.5 MG tablet, Take 1 tablet by mouth 3 (three) times daily., Disp: 180 tablet, Rfl: 6 .  losartan (COZAAR) 25 MG tablet, TAKE 1 TABLET (25 MG TOTAL) BY MOUTH 2 (TWO) TIMES DAILY., Disp: 180 tablet, Rfl: 3 .  potassium chloride SA (K-DUR) 20 MEQ tablet, TAKE ONE TABLET BY MOUTH ONCE DAILY (AM), Disp: 30 tablet, Rfl: 6 .  spironolactone (ALDACTONE) 25 MG tablet, TAKE ONE TABLET BY MOUTH ONCE DAILY (BEDTIME), Disp: 30 tablet, Rfl: 5  Current Facility-Administered Medications:  .  methylPREDNISolone acetate (DEPO-MEDROL) injection 40 mg, 40 mg, Intra-articular, Once, [name], [name], MD Allergies  Allergen Reactions  . Sulfa Antibiotics Nausea Only     Social History   Socioeconomic History  . Marital status: Single    Spouse name: Not on file  . Number of children: Not on file  . Years of education: Not on file  . Highest education level: Not on file  Occupational History  . Not on file  Tobacco Use  . Smoking status: Current Every Day Smoker    Packs/day: 1.50    Years: 44.00    Pack years: 66.00    Types: Cigarettes    Start date: 36  . Smokeless tobacco: Never Used  . Tobacco comment: 1ppd as of 12/06/17 ep  Substance and Sexual Activity  . Alcohol use: No  . Drug use: No  . Sexual activity: Not on file  Other Topics Concern  .  Not on file  Social History Narrative  . Not on file   Social Determinants of Health   Financial Resource Strain:   . Difficulty of Paying Living Expenses:   Food Insecurity:   . Worried About Charity fundraiser in the Last Year:   . Arboriculturist in the Last Year:   Transportation Needs:   . Film/video editor (Medical):   Marland Kitchen Lack of Transportation (Non-Medical):   Physical Activity:   . Days of Exercise per Week:   . Minutes of Exercise per Session:   Stress:   . Feeling of Stress :   Social Connections:   . Frequency of Communication with  Friends and Family:   . Frequency of Social Gatherings with Friends and Family:   . Attends Religious Services:   . Active Member of Clubs or Organizations:   . Attends Archivist Meetings:   Marland Kitchen Marital Status:   Intimate Partner Violence:   . Fear of Current or Ex-Partner:   . Emotionally Abused:   Marland Kitchen Physically Abused:   . Sexually Abused:     Physical Exam Vitals reviewed.  Constitutional:      Appearance: Normal appearance. He is normal weight.  HENT:     Head: Normocephalic.     Nose: Nose normal.     Mouth/Throat:     Mouth: Mucous membranes are moist.  Eyes:     Pupils: Pupils are equal, round, and reactive to light.  Cardiovascular:     Rate and Rhythm: Normal rate and regular rhythm.     Pulses: Normal pulses.     Heart sounds: Normal heart sounds.  Pulmonary:     Effort: Pulmonary effort is normal.     Breath sounds: Normal breath sounds.  Abdominal:     General: Abdomen is flat.     Palpations: Abdomen is soft.  Musculoskeletal:        General: Normal range of motion.     Cervical back: Normal range of motion.  Skin:    General: Skin is warm and dry.     Capillary Refill: Capillary refill takes less than 2 seconds.  Neurological:     Mental Status: He is alert. Mental status is at baseline.  Psychiatric:        Mood and Affect: Mood normal.     Arrived for home visit for Gerald Nelson who was standing outside alert and oriented smoking a cigarette on my arrival. Gerald Nelson brought pill box and meds outside. Medications verified and confirmed. Pill box filled accordingly. Vitals were obtained and are as noted in report. No edema noted. Lung sounds clear. Gerald Nelson denied increased shortness of breath more than normal. Gerald Nelson reports his 2nd Covid vaccine is on the 27th. Refills as noted. Home visit complete. Will see patient in one week.   Refills: Potassium     Future Appointments  Date Time Provider [place]  10/29/2019  1:00 PM LBCT-CT 1 LBCT-CT  [facility]  10/30/2019  9:30 AM [facility]     ACTION: Home visit completed Next visit planned for one week

## 2019-10-23 ENCOUNTER — Other Ambulatory Visit (HOSPITAL_COMMUNITY): Payer: Self-pay

## 2019-10-23 NOTE — Progress Notes (Signed)
Paramedicine Encounter    Patient ID: Gerald Nelson, male    DOB: 03/16/1951, 69 y.o.   MRN: [id number]   Patient Care Team: [name], MD as PCP - General (Internal Medicine) [name], MD as PCP - Advanced Heart Failure (Cardiology) [name], LCSW as Social Worker (Licensed Clinical Social Worker)  Patient Active Problem List   Diagnosis Date Noted  . Stage 3 severe COPD by GOLD classification ([place]) 11/14/2018  . Noncompliance 04/23/2017  . Mitral regurgitation 04/23/2017  . Thrombus - possible apical thrombus 01/2017 04/23/2017  . Hyperlipidemia 04/23/2017  . Pulmonary hypertension ([place])   . SOB (shortness of breath)   . Palliative care by specialist   . Tobacco abuse 04/19/2017  . Acute respiratory failure with hypoxia ([place]) 01/27/2017  . Hypertensive urgency 01/27/2017  . CHF (congestive heart failure) ([place]) 01/26/2017  . CKD (chronic kidney disease), stage III 01/04/2017  . Essential hypertension 01/04/2017  . CVA (cerebral infarction) 12/12/2013  . Hyponatremia 12/06/2012  . Hiatal hernia 12/06/2012  . Syncope 12/06/2012  . Schizophrenia ([place])     Current Outpatient Medications:  .  albuterol (PROVENTIL HFA;VENTOLIN HFA) 108 (90 Base) MCG/ACT inhaler, Inhale 1-2 puffs into the lungs every 6 (six) hours as needed for wheezing or shortness of breath., Disp: , Rfl:  .  aspirin 81 MG EC tablet, TAKE 1 TABLET BY MOUTH EVERY DAY, Disp: 30 tablet, Rfl: 1 .  atorvastatin (LIPITOR) 40 MG tablet, TAKE ONE TABLET BY MOUTH EVERY DAY IN THE EVENING, Disp: 30 tablet, Rfl: 6 .  bisoprolol (ZEBETA) 10 MG tablet, TAKE 1 TABLET (10 MG TOTAL) BY MOUTH DAILY., Disp: 90 tablet, Rfl: 1 .  Fluticasone-Umeclidin-Vilant (TRELEGY ELLIPTA) 100-62.5-25 MCG/INH AEPB, Inhale 1 puff into the lungs daily., Disp: 2 each, Rfl: 0 .  furosemide (LASIX) 40 MG tablet, Take 0.5 tablets (20 mg total) by mouth daily., Disp: 30 tablet, Rfl: 6 .  haloperidol decanoate (HALDOL DECANOATE) 100 MG/ML  injection, INJECT 1 ML EVERY 4 WEEKS, Disp: , Rfl:  .  isosorbide-hydrALAZINE (BIDIL) 20-37.5 MG tablet, Take 1 tablet by mouth 3 (three) times daily., Disp: 180 tablet, Rfl: 6 .  losartan (COZAAR) 25 MG tablet, TAKE 1 TABLET (25 MG TOTAL) BY MOUTH 2 (TWO) TIMES DAILY., Disp: 180 tablet, Rfl: 3 .  potassium chloride SA (K-DUR) 20 MEQ tablet, TAKE ONE TABLET BY MOUTH ONCE DAILY (AM), Disp: 30 tablet, Rfl: 6 .  spironolactone (ALDACTONE) 25 MG tablet, TAKE ONE TABLET BY MOUTH ONCE DAILY (BEDTIME), Disp: 30 tablet, Rfl: 5  Current Facility-Administered Medications:  .  methylPREDNISolone acetate (DEPO-MEDROL) injection 40 mg, 40 mg, Intra-articular, Once, [name], [name], MD Allergies  Allergen Reactions  . Sulfa Antibiotics Nausea Only     Social History   Socioeconomic History  . Marital status: Single    Spouse name: Not on file  . Number of children: Not on file  . Years of education: Not on file  . Highest education level: Not on file  Occupational History  . Not on file  Tobacco Use  . Smoking status: Current Every Day Smoker    Packs/day: 1.50    Years: 44.00    Pack years: 66.00    Types: Cigarettes    Start date: 36  . Smokeless tobacco: Never Used  . Tobacco comment: 1ppd as of 12/06/17 ep  Substance and Sexual Activity  . Alcohol use: No  . Drug use: No  . Sexual activity: Not on file  Other Topics Concern  .  Not on file  Social History Narrative  . Not on file   Social Determinants of Health   Financial Resource Strain:   . Difficulty of Paying Living Expenses:   Food Insecurity:   . Worried About Charity fundraiser in the Last Year:   . Arboriculturist in the Last Year:   Transportation Needs:   . Film/video editor (Medical):   Marland Kitchen Lack of Transportation (Non-Medical):   Physical Activity:   . Days of Exercise per Week:   . Minutes of Exercise per Session:   Stress:   . Feeling of Stress :   Social Connections:   . Frequency of Communication with  Friends and Family:   . Frequency of Social Gatherings with Friends and Family:   . Attends Religious Services:   . Active Member of Clubs or Organizations:   . Attends Archivist Meetings:   Marland Kitchen Marital Status:   Intimate Partner Violence:   . Fear of Current or Ex-Partner:   . Emotionally Abused:   Marland Kitchen Physically Abused:   . Sexually Abused:     Physical Exam   Arrived for home visit for Mr. Hanover who was alert ambulating down the driveway to meet me appearing slightly short of breath while walking and smoking a cigarette. Smoking cessation discussed. Lennard expressed "he is trying". 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 He stated he filed for unemployment today and is seeking other job opportunities. I advised him to let me know if he needed assistance. He agreed. Medications were reviewed and verified. Pill box filled. Refills will be as noted. Vitals are as noted. No edema, abdominal swelling or JVD. Lung sounds normal for patient. Will follow up in one week.   Refills: Potassium Spironolactone    Future Appointments  Date Time Provider [place]  10/29/2019  1:00 PM [facility]-CT 1 [facility]-CT [facility]  10/30/2019  9:30 AM [facility]     ACTION: Home visit completed Next visit planned for one week

## 2019-10-24 ENCOUNTER — Other Ambulatory Visit: Payer: Self-pay | Admitting: Cardiology

## 2019-10-29 ENCOUNTER — Other Ambulatory Visit: Payer: Self-pay

## 2019-10-29 ENCOUNTER — Ambulatory Visit (INDEPENDENT_AMBULATORY_CARE_PROVIDER_SITE_OTHER)
Admission: RE | Admit: 2019-10-29 | Discharge: 2019-10-29 | Disposition: A | Discharge status: Home / Self Care | Payer: Medicare Other | Source: Ambulatory Visit | Attending: Acute Care | Admitting: Acute Care

## 2019-10-29 DIAGNOSIS — F17210 Nicotine dependence, cigarettes, uncomplicated: Secondary | ICD-10-CM

## 2019-10-29 DIAGNOSIS — Z87891 Personal history of nicotine dependence: Secondary | ICD-10-CM | POA: Diagnosis not present

## 2019-10-29 DIAGNOSIS — Z122 Encounter for screening for malignant neoplasm of respiratory organs: Secondary | ICD-10-CM

## 2019-10-30 ENCOUNTER — Other Ambulatory Visit (HOSPITAL_COMMUNITY): Payer: Self-pay

## 2019-10-30 ENCOUNTER — Ambulatory Visit: Payer: Self-pay

## 2019-10-30 ENCOUNTER — Ambulatory Visit: Payer: Medicare Other | Attending: Internal Medicine

## 2019-10-30 DIAGNOSIS — Z23 Encounter for immunization: Secondary | ICD-10-CM

## 2019-10-30 NOTE — Progress Notes (Signed)
Paramedicine Encounter    Patient ID: Gerald Nelson, male    DOB: 1951/05/06, 69 y.o.   MRN: [id number]   Patient Care Team: [name], MD as PCP - General (Internal Medicine) [name], MD as PCP - Advanced Heart Failure (Cardiology) [name], LCSW as Social Worker (Licensed Clinical Social Worker)  Patient Active Problem List   Diagnosis Date Noted  . Stage 3 severe COPD by GOLD classification ([place]) 11/14/2018  . Noncompliance 04/23/2017  . Mitral regurgitation 04/23/2017  . Thrombus - possible apical thrombus 01/2017 04/23/2017  . Hyperlipidemia 04/23/2017  . Pulmonary hypertension ([place])   . SOB (shortness of breath)   . Palliative care by specialist   . Tobacco abuse 04/19/2017  . Acute respiratory failure with hypoxia ([place]) 01/27/2017  . Hypertensive urgency 01/27/2017  . CHF (congestive heart failure) ([place]) 01/26/2017  . CKD (chronic kidney disease), stage III 01/04/2017  . Essential hypertension 01/04/2017  . CVA (cerebral infarction) 12/12/2013  . Hyponatremia 12/06/2012  . Hiatal hernia 12/06/2012  . Syncope 12/06/2012  . Schizophrenia ([place])     Current Outpatient Medications:  .  albuterol (PROVENTIL HFA;VENTOLIN HFA) 108 (90 Base) MCG/ACT inhaler, Inhale 1-2 puffs into the lungs every 6 (six) hours as needed for wheezing or shortness of breath., Disp: , Rfl:  .  aspirin 81 MG EC tablet, TAKE 1 TABLET BY MOUTH EVERY DAY, Disp: 30 tablet, Rfl: 1 .  atorvastatin (LIPITOR) 40 MG tablet, TAKE ONE TABLET BY MOUTH EVERY DAY IN THE EVENING, Disp: 30 tablet, Rfl: 6 .  bisoprolol (ZEBETA) 10 MG tablet, TAKE 1 TABLET (10 MG TOTAL) BY MOUTH DAILY., Disp: 90 tablet, Rfl: 1 .  Fluticasone-Umeclidin-Vilant (TRELEGY ELLIPTA) 100-62.5-25 MCG/INH AEPB, Inhale 1 puff into the lungs daily., Disp: 2 each, Rfl: 0 .  haloperidol decanoate (HALDOL DECANOATE) 100 MG/ML injection, INJECT 1 ML EVERY 4 WEEKS, Disp: , Rfl:  .  isosorbide-hydrALAZINE (BIDIL) 20-37.5 MG tablet,  Take 1 tablet by mouth 3 (three) times daily., Disp: 180 tablet, Rfl: 6 .  losartan (COZAAR) 25 MG tablet, TAKE 1 TABLET (25 MG TOTAL) BY MOUTH 2 (TWO) TIMES DAILY., Disp: 180 tablet, Rfl: 3 .  potassium chloride SA (KLOR-CON) 20 MEQ tablet, TAKE ONE TABLET BY MOUTH ONCE DAILY (AM), Disp: 90 tablet, Rfl: 3 .  spironolactone (ALDACTONE) 25 MG tablet, TAKE ONE TABLET BY MOUTH ONCE DAILY (BEDTIME), Disp: 30 tablet, Rfl: 5 .  furosemide (LASIX) 40 MG tablet, Take 0.5 tablets (20 mg total) by mouth daily., Disp: 30 tablet, Rfl: 6  Current Facility-Administered Medications:  .  methylPREDNISolone acetate (DEPO-MEDROL) injection 40 mg, 40 mg, Intra-articular, Once, [name], [name], MD Allergies  Allergen Reactions  . Sulfa Antibiotics Nausea Only     Social History   Socioeconomic History  . Marital status: Single    Spouse name: Not on file  . Number of children: Not on file  . Years of education: Not on file  . Highest education level: Not on file  Occupational History  . Not on file  Tobacco Use  . Smoking status: Current Every Day Smoker    Packs/day: 1.50    Years: 44.00    Pack years: 66.00    Types: Cigarettes    Start date: 35  . Smokeless tobacco: Never Used  . Tobacco comment: 1ppd as of 12/06/17 ep  Substance and Sexual Activity  . Alcohol use: No  . Drug use: No  . Sexual activity: Not on file  Other Topics Concern  .  Not on file  Social History Narrative  . Not on file   Social Determinants of Health   Financial Resource Strain:   . Difficulty of Paying Living Expenses:   Food Insecurity:   . Worried About Charity fundraiser in the Last Year:   . Arboriculturist in the Last Year:   Transportation Needs:   . Film/video editor (Medical):   Marland Kitchen Lack of Transportation (Non-Medical):   Physical Activity:   . Days of Exercise per Week:   . Minutes of Exercise per Session:   Stress:   . Feeling of Stress :   Social Connections:   . Frequency of  Communication with Friends and Family:   . Frequency of Social Gatherings with Friends and Family:   . Attends Religious Services:   . Active Member of Clubs or Organizations:   . Attends Archivist Meetings:   Marland Kitchen Marital Status:   Intimate Partner Violence:   . Fear of Current or Ex-Partner:   . Emotionally Abused:   Marland Kitchen Physically Abused:   . Sexually Abused:     Physical Exam Vitals reviewed.  Constitutional:      Appearance: He is normal weight.  HENT:     Head: Normocephalic.     Nose: Nose normal.     Mouth/Throat:     Mouth: Mucous membranes are moist.  Eyes:     Pupils: Pupils are equal, round, and reactive to light.  Cardiovascular:     Rate and Rhythm: Normal rate and regular rhythm.     Pulses: Normal pulses.  Pulmonary:     Effort: Pulmonary effort is normal.     Breath sounds: Normal breath sounds.  Abdominal:     General: Abdomen is flat.     Palpations: Abdomen is soft.  Musculoskeletal:        General: Normal range of motion.     Cervical back: Normal range of motion and neck supple.     Right lower leg: No edema.     Left lower leg: No edema.  Skin:    General: Skin is warm and dry.     Capillary Refill: Capillary refill takes less than 2 seconds.  Neurological:     Mental Status: He is alert. Mental status is at baseline.  Psychiatric:        Mood and Affect: Mood normal.     Arrived for home visit for Gerald Nelson who was alert and oriented ambulating outside with no increased shortness of breath witnessed. Gerald Nelson denied any complaints or trouble breathing, chest pain, dizziness, walking, eating or sleeping. Medications were reviewed and confirmed pill box filled appropriately. Vitals obtained and as noted. Gerald Nelson lost 3 lbs over the last week. No swelling, edema noted. Lung sounds normal for patient. Gerald Nelson reported he had his second covid vaccine today and feels "fine". Gerald Nelson made aware to reach out if he has any issues. Home visit complete. I will  see patient in one week.   Refills: NONE  No future appointments.   ACTION: Home visit completed Next visit planned for one week

## 2019-10-30 NOTE — Progress Notes (Signed)
   Covid-19 Vaccination Clinic  Name:  Gerald Nelson    MRN: [id number] DOB: 1950/10/05  10/30/2019  [name] was observed post Covid-19 immunization for 15 minutes without incident. He was provided with Vaccine Information Sheet and instruction to access the V-Safe system.   [name] was instructed to call 911 with any severe reactions post vaccine: Marland Kitchen Difficulty breathing  . Swelling of face and throat  . A fast heartbeat  . A bad rash all over body  . Dizziness and weakness   Immunizations Administered    Name Date Dose VIS Date Route   Pfizer COVID-19 Vaccine 10/30/2019  8:57 AM 0.3 mL 08/29/2018 Intramuscular   Manufacturer: [place]   Lot: [id number]   [place]: [id number]

## 2019-11-01 NOTE — Progress Notes (Signed)
Please call patient and let them  know their  low dose Ct was read as a Lung RADS 2: nodules that are benign in appearance and behavior with a very low likelihood of becoming a clinically active cancer due to size or lack of growth. Recommendation per radiology is for a repeat LDCT in 12 months. .Please let them  know we will order and schedule their  annual screening scan for 10/2019. Please let them  know there was notation of CAD on their  scan.  Please remind the patient  that this is a non-gated exam therefore degree or severity of disease  cannot be determined. Please have them  follow up with their PCP regarding potential risk factor modification, dietary therapy or pharmacologic therapy if clinically indicated. Pt.  is  currently on statin therapy. Please place order for annual  screening scan for  10/2019 and fax results to PCP. Thanks so much.

## 2019-11-02 ENCOUNTER — Telehealth: Payer: Self-pay | Admitting: Acute Care

## 2019-11-02 DIAGNOSIS — Z87891 Personal history of nicotine dependence: Secondary | ICD-10-CM

## 2019-11-02 DIAGNOSIS — F17210 Nicotine dependence, cigarettes, uncomplicated: Secondary | ICD-10-CM

## 2019-11-02 NOTE — Telephone Encounter (Signed)
Pt informed of CT results per Sarah Groce, NP.  PT verbalized understanding.  Copy sent to PCP.  Order placed for 1 yr f/u CT.  

## 2019-11-06 ENCOUNTER — Other Ambulatory Visit (HOSPITAL_COMMUNITY): Payer: Self-pay | Admitting: Cardiology

## 2019-11-06 ENCOUNTER — Other Ambulatory Visit (HOSPITAL_COMMUNITY): Payer: Self-pay

## 2019-11-06 NOTE — Progress Notes (Signed)
Paramedicine Encounter    Patient ID: Gerald Nelson, male    DOB: May 26, 1951, 69 y.o.   MRN: [id number]   Patient Care Team: [name], MD as PCP - General (Internal Medicine) [name], MD as PCP - Advanced Heart Failure (Cardiology) [name], LCSW as Social Worker (Licensed Clinical Social Worker)  Patient Active Problem List   Diagnosis Date Noted  . Stage 3 severe COPD by GOLD classification ([place]) 11/14/2018  . Noncompliance 04/23/2017  . Mitral regurgitation 04/23/2017  . Thrombus - possible apical thrombus 01/2017 04/23/2017  . Hyperlipidemia 04/23/2017  . Pulmonary hypertension ([place])   . SOB (shortness of breath)   . Palliative care by specialist   . Tobacco abuse 04/19/2017  . Acute respiratory failure with hypoxia ([place]) 01/27/2017  . Hypertensive urgency 01/27/2017  . CHF (congestive heart failure) ([place]) 01/26/2017  . CKD (chronic kidney disease), stage III 01/04/2017  . Essential hypertension 01/04/2017  . CVA (cerebral infarction) 12/12/2013  . Hyponatremia 12/06/2012  . Hiatal hernia 12/06/2012  . Syncope 12/06/2012  . Schizophrenia ([place])     Current Outpatient Medications:  .  albuterol (PROVENTIL HFA;VENTOLIN HFA) 108 (90 Base) MCG/ACT inhaler, Inhale 1-2 puffs into the lungs every 6 (six) hours as needed for wheezing or shortness of breath., Disp: , Rfl:  .  aspirin 81 MG EC tablet, TAKE 1 TABLET BY MOUTH EVERY DAY, Disp: 30 tablet, Rfl: 1 .  atorvastatin (LIPITOR) 40 MG tablet, TAKE ONE TABLET BY MOUTH EVERY DAY IN THE EVENING, Disp: 30 tablet, Rfl: 6 .  bisoprolol (ZEBETA) 10 MG tablet, TAKE 1 TABLET (10 MG TOTAL) BY MOUTH DAILY., Disp: 90 tablet, Rfl: 1 .  Fluticasone-Umeclidin-Vilant (TRELEGY ELLIPTA) 100-62.5-25 MCG/INH AEPB, Inhale 1 puff into the lungs daily., Disp: 2 each, Rfl: 0 .  furosemide (LASIX) 40 MG tablet, Take 0.5 tablets (20 mg total) by mouth daily., Disp: 30 tablet, Rfl: 6 .  haloperidol decanoate (HALDOL DECANOATE) 100 MG/ML  injection, INJECT 1 ML EVERY 4 WEEKS, Disp: , Rfl:  .  isosorbide-hydrALAZINE (BIDIL) 20-37.5 MG tablet, Take 1 tablet by mouth 3 (three) times daily., Disp: 180 tablet, Rfl: 6 .  losartan (COZAAR) 25 MG tablet, TAKE 1 TABLET (25 MG TOTAL) BY MOUTH 2 (TWO) TIMES DAILY., Disp: 180 tablet, Rfl: 3 .  potassium chloride SA (KLOR-CON) 20 MEQ tablet, TAKE ONE TABLET BY MOUTH ONCE DAILY (AM), Disp: 90 tablet, Rfl: 3 .  spironolactone (ALDACTONE) 25 MG tablet, TAKE ONE TABLET BY MOUTH ONCE DAILY (BEDTIME), Disp: 30 tablet, Rfl: 5  Current Facility-Administered Medications:  .  methylPREDNISolone acetate (DEPO-MEDROL) injection 40 mg, 40 mg, Intra-articular, Once, [name], [name], MD Allergies  Allergen Reactions  . Sulfa Antibiotics Nausea Only     Social History   Socioeconomic History  . Marital status: Single    Spouse name: Not on file  . Number of children: Not on file  . Years of education: Not on file  . Highest education level: Not on file  Occupational History  . Not on file  Tobacco Use  . Smoking status: Current Every Day Smoker    Packs/day: 1.50    Years: 44.00    Pack years: 66.00    Types: Cigarettes    Start date: 57  . Smokeless tobacco: Never Used  . Tobacco comment: 1ppd as of 12/06/17 ep  Substance and Sexual Activity  . Alcohol use: No  . Drug use: No  . Sexual activity: Not on file  Other Topics Concern  .  Not on file  Social History Narrative  . Not on file   Social Determinants of Health   Financial Resource Strain:   . Difficulty of Paying Living Expenses:   Food Insecurity:   . Worried About Charity fundraiser in the Last Year:   . Arboriculturist in the Last Year:   Transportation Needs:   . Film/video editor (Medical):   Marland Kitchen Lack of Transportation (Non-Medical):   Physical Activity:   . Days of Exercise per Week:   . Minutes of Exercise per Session:   Stress:   . Feeling of Stress :   Social Connections:   . Frequency of Communication  with Friends and Family:   . Frequency of Social Gatherings with Friends and Family:   . Attends Religious Services:   . Active Member of Clubs or Organizations:   . Attends Archivist Meetings:   Marland Kitchen Marital Status:   Intimate Partner Violence:   . Fear of Current or Ex-Partner:   . Emotionally Abused:   Marland Kitchen Physically Abused:   . Sexually Abused:     Physical Exam Constitutional:      Appearance: He is normal weight.  HENT:     Head: Normocephalic.     Nose: Nose normal.     Mouth/Throat:     Mouth: Mucous membranes are moist.  Eyes:     Pupils: Pupils are equal, round, and reactive to light.  Cardiovascular:     Rate and Rhythm: Normal rate and regular rhythm.     Pulses: Normal pulses.     Heart sounds: Normal heart sounds.  Pulmonary:     Effort: Pulmonary effort is normal.     Breath sounds: Normal breath sounds.  Abdominal:     General: Abdomen is flat.     Palpations: Abdomen is soft.  Musculoskeletal:        General: Normal range of motion.     Cervical back: Normal range of motion.     Right lower leg: No edema.     Left lower leg: No edema.  Skin:    General: Skin is warm and dry.     Capillary Refill: Capillary refill takes less than 2 seconds.  Neurological:     Mental Status: He is alert. Mental status is at baseline.  Psychiatric:        Mood and Affect: Mood normal.      Arrived for home visit for Gerald Nelson who was seated outside alert and oriented with no complaints. Lung sounds clear. Vitals as noted. Meds reviewed and confirmed. Pill box completed. Gerald Nelson had no complaints or needs for todays visit. Home visit complete.   Refills: -Lasix -Bisoprolol      No future appointments.   ACTION: Home visit completed Next visit planned for one week

## 2019-11-13 ENCOUNTER — Other Ambulatory Visit (HOSPITAL_COMMUNITY): Payer: Self-pay

## 2019-11-13 NOTE — Progress Notes (Signed)
Paramedicine Encounter    Patient ID: Gerald Nelson, male    DOB: [birth year], 69 y.o.   MRN: [id number]   Patient Care Team: [name], MD as PCP - General (Internal Medicine) [name], MD as PCP - Advanced Heart Failure (Cardiology) [name], LCSW as Social Worker (Licensed Clinical Social Worker)  Patient Active Problem List   Diagnosis Date Noted  . Stage 3 severe COPD by GOLD classification ([place]) 11/14/2018  . Noncompliance 04/23/2017  . Mitral regurgitation 04/23/2017  . Thrombus - possible apical thrombus 01/2017 04/23/2017  . Hyperlipidemia 04/23/2017  . Pulmonary hypertension ([place])   . SOB (shortness of breath)   . Palliative care by specialist   . Tobacco abuse 04/19/2017  . Acute respiratory failure with hypoxia ([place]) 01/27/2017  . Hypertensive urgency 01/27/2017  . CHF (congestive heart failure) ([place]) 01/26/2017  . CKD (chronic kidney disease), stage III 01/04/2017  . Essential hypertension 01/04/2017  . CVA (cerebral infarction) 12/12/2013  . Hyponatremia 12/06/2012  . Hiatal hernia 12/06/2012  . Syncope 12/06/2012  . Schizophrenia ([place])     Current Outpatient Medications:  .  albuterol (PROVENTIL HFA;VENTOLIN HFA) 108 (90 Base) MCG/ACT inhaler, Inhale 1-2 puffs into the lungs every 6 (six) hours as needed for wheezing or shortness of breath., Disp: , Rfl:  .  aspirin 81 MG EC tablet, TAKE 1 TABLET BY MOUTH EVERY DAY, Disp: 30 tablet, Rfl: 1 .  atorvastatin (LIPITOR) 40 MG tablet, TAKE ONE TABLET BY MOUTH EVERY DAY IN THE EVENING, Disp: 30 tablet, Rfl: 6 .  bisoprolol (ZEBETA) 10 MG tablet, TAKE 1 TABLET (10 MG TOTAL) BY MOUTH DAILY., Disp: 90 tablet, Rfl: 3 .  Fluticasone-Umeclidin-Vilant (TRELEGY ELLIPTA) 100-62.5-25 MCG/INH AEPB, Inhale 1 puff into the lungs daily., Disp: 2 each, Rfl: 0 .  furosemide (LASIX) 40 MG tablet, Take 0.5 tablets (20 mg total) by mouth daily., Disp: 30 tablet, Rfl: 6 .  haloperidol decanoate (HALDOL DECANOATE) 100 MG/ML  injection, INJECT 1 ML EVERY 4 WEEKS, Disp: , Rfl:  .  isosorbide-hydrALAZINE (BIDIL) 20-37.5 MG tablet, Take 1 tablet by mouth 3 (three) times daily., Disp: 180 tablet, Rfl: 6 .  losartan (COZAAR) 25 MG tablet, TAKE 1 TABLET (25 MG TOTAL) BY MOUTH 2 (TWO) TIMES DAILY., Disp: 180 tablet, Rfl: 3 .  potassium chloride SA (KLOR-CON) 20 MEQ tablet, TAKE ONE TABLET BY MOUTH ONCE DAILY (AM), Disp: 90 tablet, Rfl: 3 .  spironolactone (ALDACTONE) 25 MG tablet, TAKE ONE TABLET BY MOUTH ONCE DAILY (BEDTIME), Disp: 30 tablet, Rfl: 5  Current Facility-Administered Medications:  .  methylPREDNISolone acetate (DEPO-MEDROL) injection 40 mg, 40 mg, Intra-articular, Once, [name], [name], MD Allergies  Allergen Reactions  . Sulfa Antibiotics Nausea Only      Social History   Socioeconomic History  . Marital status: Single    Spouse name: Not on file  . Number of children: Not on file  . Years of education: Not on file  . Highest education level: Not on file  Occupational History  . Not on file  Tobacco Use  . Smoking status: Current Every Day Smoker    Packs/day: 1.50    Years: 44.00    Pack years: 66.00    Types: Cigarettes    Start date: 67  . Smokeless tobacco: Never Used  . Tobacco comment: 1ppd as of 12/06/17 ep  Substance and Sexual Activity  . Alcohol use: No  . Drug use: No  . Sexual activity: Not on file  Other Topics  Concern  . Not on file  Social History Narrative  . Not on file   Social Determinants of Health   Financial Resource Strain:   . Difficulty of Paying Living Expenses:   Food Insecurity:   . Worried About Charity fundraiser in the Last Year:   . Arboriculturist in the Last Year:   Transportation Needs:   . Film/video editor (Medical):   Marland Kitchen Lack of Transportation (Non-Medical):   Physical Activity:   . Days of Exercise per Week:   . Minutes of Exercise per Session:   Stress:   . Feeling of Stress :   Social Connections:   . Frequency of Communication  with Friends and Family:   . Frequency of Social Gatherings with Friends and Family:   . Attends Religious Services:   . Active Member of Clubs or Organizations:   . Attends Archivist Meetings:   Marland Kitchen Marital Status:   Intimate Partner Violence:   . Fear of Current or Ex-Partner:   . Emotionally Abused:   Marland Kitchen Physically Abused:   . Sexually Abused:     Physical Exam      No future appointments.  BP 118/82   Pulse 82   Temp 97.9 F (36.6 C)   Resp 18   Wt 144 lb (65.3 kg)   SpO2 98%   BMI 20.66 kg/m   Weight yesterday-144 Last visit weight-141  Pt reports he is feeling fine.  He denies increased sob, no c/p, no dizziness.  He missed a few doses of his meds this week.  meds verified and pill box refilled.  He has wheezing all over, he will use inhaler. But he denies any sob.  He reports his psychologist wants him to take an additional medication for the mental state but he isnt sure about it at first but then he states since she is the doctor then she probably knows best.   Marylouise Stacks, [place] Paramedic  11/14/19

## 2019-11-20 ENCOUNTER — Other Ambulatory Visit (HOSPITAL_COMMUNITY): Payer: Self-pay

## 2019-11-20 NOTE — Progress Notes (Signed)
Paramedicine Encounter    Patient ID: Gerald Nelson, male    DOB: 1950/08/26, 69 y.o.   MRN: [id number]   Patient Care Team: [name], MD as PCP - General (Internal Medicine) [name], MD as PCP - Advanced Heart Failure (Cardiology) [name], LCSW as Social Worker (Licensed Clinical Social Worker)  Patient Active Problem List   Diagnosis Date Noted  . Stage 3 severe COPD by GOLD classification ([place]) 11/14/2018  . Noncompliance 04/23/2017  . Mitral regurgitation 04/23/2017  . Thrombus - possible apical thrombus 01/2017 04/23/2017  . Hyperlipidemia 04/23/2017  . Pulmonary hypertension ([place])   . SOB (shortness of breath)   . Palliative care by specialist   . Tobacco abuse 04/19/2017  . Acute respiratory failure with hypoxia ([place]) 01/27/2017  . Hypertensive urgency 01/27/2017  . CHF (congestive heart failure) ([place]) 01/26/2017  . CKD (chronic kidney disease), stage III 01/04/2017  . Essential hypertension 01/04/2017  . CVA (cerebral infarction) 12/12/2013  . Hyponatremia 12/06/2012  . Hiatal hernia 12/06/2012  . Syncope 12/06/2012  . Schizophrenia ([place])     Current Outpatient Medications:  .  albuterol (PROVENTIL HFA;VENTOLIN HFA) 108 (90 Base) MCG/ACT inhaler, Inhale 1-2 puffs into the lungs every 6 (six) hours as needed for wheezing or shortness of breath., Disp: , Rfl:  .  aspirin 81 MG EC tablet, TAKE 1 TABLET BY MOUTH EVERY DAY, Disp: 30 tablet, Rfl: 1 .  atorvastatin (LIPITOR) 40 MG tablet, TAKE ONE TABLET BY MOUTH EVERY DAY IN THE EVENING, Disp: 30 tablet, Rfl: 6 .  bisoprolol (ZEBETA) 10 MG tablet, TAKE 1 TABLET (10 MG TOTAL) BY MOUTH DAILY., Disp: 90 tablet, Rfl: 3 .  furosemide (LASIX) 40 MG tablet, Take 0.5 tablets (20 mg total) by mouth daily., Disp: 30 tablet, Rfl: 6 .  haloperidol decanoate (HALDOL DECANOATE) 100 MG/ML injection, INJECT 1 ML EVERY 4 WEEKS, Disp: , Rfl:  .  isosorbide-hydrALAZINE (BIDIL) 20-37.5 MG tablet, Take 1 tablet by mouth 3  (three) times daily., Disp: 180 tablet, Rfl: 6 .  losartan (COZAAR) 25 MG tablet, TAKE 1 TABLET (25 MG TOTAL) BY MOUTH 2 (TWO) TIMES DAILY., Disp: 180 tablet, Rfl: 3 .  potassium chloride SA (KLOR-CON) 20 MEQ tablet, TAKE ONE TABLET BY MOUTH ONCE DAILY (AM), Disp: 90 tablet, Rfl: 3 .  spironolactone (ALDACTONE) 25 MG tablet, TAKE ONE TABLET BY MOUTH ONCE DAILY (BEDTIME), Disp: 30 tablet, Rfl: 5 .  Fluticasone-Umeclidin-Vilant (TRELEGY ELLIPTA) 100-62.5-25 MCG/INH AEPB, Inhale 1 puff into the lungs daily., Disp: 2 each, Rfl: 0  Current Facility-Administered Medications:  .  methylPREDNISolone acetate (DEPO-MEDROL) injection 40 mg, 40 mg, Intra-articular, Once, [name], [name], MD Allergies  Allergen Reactions  . Sulfa Antibiotics Nausea Only     Social History   Socioeconomic History  . Marital status: Single    Spouse name: Not on file  . Number of children: Not on file  . Years of education: Not on file  . Highest education level: Not on file  Occupational History  . Not on file  Tobacco Use  . Smoking status: Current Every Day Smoker    Packs/day: 1.50    Years: 44.00    Pack years: 66.00    Types: Cigarettes    Start date: 49  . Smokeless tobacco: Never Used  . Tobacco comment: 1ppd as of 12/06/17 ep  Substance and Sexual Activity  . Alcohol use: No  . Drug use: No  . Sexual activity: Not on file  Other Topics Concern  .  Not on file  Social History Narrative  . Not on file   Social Determinants of Health   Financial Resource Strain:   . Difficulty of Paying Living Expenses:   Food Insecurity:   . Worried About Charity fundraiser in the Last Year:   . Arboriculturist in the Last Year:   Transportation Needs:   . Film/video editor (Medical):   Marland Kitchen Lack of Transportation (Non-Medical):   Physical Activity:   . Days of Exercise per Week:   . Minutes of Exercise per Session:   Stress:   . Feeling of Stress :   Social Connections:   . Frequency of  Communication with Friends and Family:   . Frequency of Social Gatherings with Friends and Family:   . Attends Religious Services:   . Active Member of Clubs or Organizations:   . Attends Archivist Meetings:   Marland Kitchen Marital Status:   Intimate Partner Violence:   . Fear of Current or Ex-Partner:   . Emotionally Abused:   Marland Kitchen Physically Abused:   . Sexually Abused:     Physical Exam Vitals reviewed.  Constitutional:      Appearance: He is normal weight.  HENT:     Head: Normocephalic.     Nose: Nose normal.     Mouth/Throat:     Mouth: Mucous membranes are moist.  Eyes:     Pupils: Pupils are equal, round, and reactive to light.  Cardiovascular:     Rate and Rhythm: Normal rate and regular rhythm.     Pulses: Normal pulses.     Heart sounds: Normal heart sounds.  Pulmonary:     Effort: Pulmonary effort is normal.     Breath sounds: Normal breath sounds.  Abdominal:     General: Abdomen is flat.     Palpations: Abdomen is soft.  Musculoskeletal:        General: Normal range of motion.     Cervical back: Normal range of motion.     Right lower leg: No edema.     Left lower leg: No edema.  Skin:    General: Skin is warm and dry.     Capillary Refill: Capillary refill takes less than 2 seconds.  Neurological:     Mental Status: He is alert. Mental status is at baseline.  Psychiatric:        Mood and Affect: Mood normal.     Arrived for home visit for Yaviel who was seated at home with no complaints. Meds verified and confirmed. Pill box filled accordingly. Vitals obtained. Rigel and I discussed transition to bubble packs and move to a discharge phase. He agreed with same. Home visit scheduled in one week.   Refills: -Bidil -Potassium     No future appointments.   ACTION: Home visit completed Next visit planned for one week

## 2019-11-27 ENCOUNTER — Other Ambulatory Visit (HOSPITAL_COMMUNITY): Payer: Self-pay

## 2019-11-27 NOTE — Progress Notes (Signed)
Paramedicine Encounter    Patient ID: CLOYS VERA, male    DOB: [birth year], 69 y.o.   MRN: [id number]   Patient Care Team: [name], MD as PCP - General (Internal Medicine) [name], MD as PCP - Advanced Heart Failure (Cardiology) [name], LCSW as Social Worker (Licensed Clinical Social Worker)  Patient Active Problem List   Diagnosis Date Noted  . Stage 3 severe COPD by GOLD classification ([place]) 11/14/2018  . Noncompliance 04/23/2017  . Mitral regurgitation 04/23/2017  . Thrombus - possible apical thrombus 01/2017 04/23/2017  . Hyperlipidemia 04/23/2017  . Pulmonary hypertension ([place])   . SOB (shortness of breath)   . Palliative care by specialist   . Tobacco abuse 04/19/2017  . Acute respiratory failure with hypoxia ([place]) 01/27/2017  . Hypertensive urgency 01/27/2017  . CHF (congestive heart failure) ([place]) 01/26/2017  . CKD (chronic kidney disease), stage III 01/04/2017  . Essential hypertension 01/04/2017  . CVA (cerebral infarction) 12/12/2013  . Hyponatremia 12/06/2012  . Hiatal hernia 12/06/2012  . Syncope 12/06/2012  . Schizophrenia ([place])     Current Outpatient Medications:  .  albuterol (PROVENTIL HFA;VENTOLIN HFA) 108 (90 Base) MCG/ACT inhaler, Inhale 1-2 puffs into the lungs every 6 (six) hours as needed for wheezing or shortness of breath., Disp: , Rfl:  .  aspirin 81 MG EC tablet, TAKE 1 TABLET BY MOUTH EVERY DAY, Disp: 30 tablet, Rfl: 1 .  atorvastatin (LIPITOR) 40 MG tablet, TAKE ONE TABLET BY MOUTH EVERY DAY IN THE EVENING, Disp: 30 tablet, Rfl: 6 .  bisoprolol (ZEBETA) 10 MG tablet, TAKE 1 TABLET (10 MG TOTAL) BY MOUTH DAILY., Disp: 90 tablet, Rfl: 3 .  Fluticasone-Umeclidin-Vilant (TRELEGY ELLIPTA) 100-62.5-25 MCG/INH AEPB, Inhale 1 puff into the lungs daily., Disp: 2 each, Rfl: 0 .  furosemide (LASIX) 40 MG tablet, Take 0.5 tablets (20 mg total) by mouth daily., Disp: 30 tablet, Rfl: 6 .  haloperidol decanoate (HALDOL DECANOATE) 100 MG/ML  injection, INJECT 1 ML EVERY 4 WEEKS, Disp: , Rfl:  .  isosorbide-hydrALAZINE (BIDIL) 20-37.5 MG tablet, Take 1 tablet by mouth 3 (three) times daily., Disp: 180 tablet, Rfl: 6 .  losartan (COZAAR) 25 MG tablet, TAKE 1 TABLET (25 MG TOTAL) BY MOUTH 2 (TWO) TIMES DAILY., Disp: 180 tablet, Rfl: 3 .  potassium chloride SA (KLOR-CON) 20 MEQ tablet, TAKE ONE TABLET BY MOUTH ONCE DAILY (AM), Disp: 90 tablet, Rfl: 3 .  spironolactone (ALDACTONE) 25 MG tablet, TAKE ONE TABLET BY MOUTH ONCE DAILY (BEDTIME), Disp: 30 tablet, Rfl: 5  Current Facility-Administered Medications:  .  methylPREDNISolone acetate (DEPO-MEDROL) injection 40 mg, 40 mg, Intra-articular, Once, [name], [name], MD Allergies  Allergen Reactions  . Sulfa Antibiotics Nausea Only     Social History   Socioeconomic History  . Marital status: Single    Spouse name: Not on file  . Number of children: Not on file  . Years of education: Not on file  . Highest education level: Not on file  Occupational History  . Not on file  Tobacco Use  . Smoking status: Current Every Day Smoker    Packs/day: 1.50    Years: 44.00    Pack years: 66.00    Types: Cigarettes    Start date: 72  . Smokeless tobacco: Never Used  . Tobacco comment: 1ppd as of 12/06/17 ep  Substance and Sexual Activity  . Alcohol use: No  . Drug use: No  . Sexual activity: Not on file  Other Topics Concern  .  Not on file  Social History Narrative  . Not on file   Social Determinants of Health   Financial Resource Strain:   . Difficulty of Paying Living Expenses:   Food Insecurity:   . Worried About Charity fundraiser in the Last Year:   . Arboriculturist in the Last Year:   Transportation Needs:   . Film/video editor (Medical):   Marland Kitchen Lack of Transportation (Non-Medical):   Physical Activity:   . Days of Exercise per Week:   . Minutes of Exercise per Session:   Stress:   . Feeling of Stress :   Social Connections:   . Frequency of Communication  with Friends and Family:   . Frequency of Social Gatherings with Friends and Family:   . Attends Religious Services:   . Active Member of Clubs or Organizations:   . Attends Archivist Meetings:   Marland Kitchen Marital Status:   Intimate Partner Violence:   . Fear of Current or Ex-Partner:   . Emotionally Abused:   Marland Kitchen Physically Abused:   . Sexually Abused:     Physical Exam Vitals reviewed.  Constitutional:      Appearance: He is normal weight.  HENT:     Head: Normocephalic.     Nose: Nose normal.  Eyes:     Pupils: Pupils are equal, round, and reactive to light.  Cardiovascular:     Rate and Rhythm: Normal rate and regular rhythm.     Pulses: Normal pulses.  Pulmonary:     Effort: Pulmonary effort is normal.  Abdominal:     General: Abdomen is flat.  Musculoskeletal:        General: Normal range of motion.     Cervical back: Normal range of motion.     Right lower leg: No edema.     Left lower leg: No edema.  Skin:    General: Skin is warm and dry.     Capillary Refill: Capillary refill takes less than 2 seconds.  Neurological:     Mental Status: He is alert. Mental status is at baseline.  Psychiatric:        Mood and Affect: Mood normal.    Arrived for home visit for Raequan. Vitals were obtained. Medications reviewed and are as noted.  Pill box was filled accordingly. Rohin agreed with bubble packs in which I will be meeting with Christy Sartorius at [employer] about same. Home visit complete. I will see patient in one week until bubble packs are completed.      No future appointments.   ACTION: Home visit completed Next visit planned for one week

## 2019-12-04 ENCOUNTER — Other Ambulatory Visit (HOSPITAL_COMMUNITY): Payer: Self-pay

## 2019-12-04 NOTE — Progress Notes (Signed)
Paramedicine Encounter    Patient ID: Gerald Nelson, male    DOB: 06/02/1951, 69 y.o.   MRN: [id number]   Patient Care Team: [name], MD as PCP - General (Internal Medicine) [name], MD as PCP - Advanced Heart Failure (Cardiology) [name], LCSW as Social Worker (Licensed Clinical Social Worker)  Patient Active Problem List   Diagnosis Date Noted   Stage 3 severe COPD by GOLD classification ([place]) 11/14/2018   Noncompliance 04/23/2017   Mitral regurgitation 04/23/2017   Thrombus - possible apical thrombus 01/2017 04/23/2017   Hyperlipidemia 04/23/2017   Pulmonary hypertension ([place])    SOB (shortness of breath)    Palliative care by specialist    Tobacco abuse 04/19/2017   Acute respiratory failure with hypoxia ([place]) 01/27/2017   Hypertensive urgency 01/27/2017   CHF (congestive heart failure) ([place]) 01/26/2017   CKD (chronic kidney disease), stage III 01/04/2017   Essential hypertension 01/04/2017   CVA (cerebral infarction) 12/12/2013   Hyponatremia 12/06/2012   Hiatal hernia 12/06/2012   Syncope 12/06/2012   Schizophrenia ([place])     Current Outpatient Medications:    aspirin 81 MG EC tablet, TAKE 1 TABLET BY MOUTH EVERY DAY, Disp: 30 tablet, Rfl: 1   atorvastatin (LIPITOR) 40 MG tablet, TAKE ONE TABLET BY MOUTH EVERY DAY IN THE EVENING, Disp: 30 tablet, Rfl: 6   bisoprolol (ZEBETA) 10 MG tablet, TAKE 1 TABLET (10 MG TOTAL) BY MOUTH DAILY., Disp: 90 tablet, Rfl: 3   Fluticasone-Umeclidin-Vilant (TRELEGY ELLIPTA) 100-62.5-25 MCG/INH AEPB, Inhale 1 puff into the lungs daily., Disp: 2 each, Rfl: 0   furosemide (LASIX) 40 MG tablet, Take 0.5 tablets (20 mg total) by mouth daily., Disp: 30 tablet, Rfl: 6   haloperidol decanoate (HALDOL DECANOATE) 100 MG/ML injection, INJECT 1 ML EVERY 4 WEEKS, Disp: , Rfl:    isosorbide-hydrALAZINE (BIDIL) 20-37.5 MG tablet, Take 1 tablet by mouth 3 (three) times daily., Disp: 180 tablet, Rfl: 6    losartan (COZAAR) 25 MG tablet, TAKE 1 TABLET (25 MG TOTAL) BY MOUTH 2 (TWO) TIMES DAILY., Disp: 180 tablet, Rfl: 3   potassium chloride SA (KLOR-CON) 20 MEQ tablet, TAKE ONE TABLET BY MOUTH ONCE DAILY (AM), Disp: 90 tablet, Rfl: 3   spironolactone (ALDACTONE) 25 MG tablet, TAKE ONE TABLET BY MOUTH ONCE DAILY (BEDTIME), Disp: 30 tablet, Rfl: 5   albuterol (PROVENTIL HFA;VENTOLIN HFA) 108 (90 Base) MCG/ACT inhaler, Inhale 1-2 puffs into the lungs every 6 (six) hours as needed for wheezing or shortness of breath., Disp: , Rfl:   Current Facility-Administered Medications:    methylPREDNISolone acetate (DEPO-MEDROL) injection 40 mg, 40 mg, Intra-articular, Once, [name], [name], MD Allergies  Allergen Reactions   Sulfa Antibiotics Nausea Only     Social History   Socioeconomic History   Marital status: Single    Spouse name: Not on file   Number of children: Not on file   Years of education: Not on file   Highest education level: Not on file  Occupational History   Not on file  Tobacco Use   Smoking status: Current Every Day Smoker    Packs/day: 1.50    Years: 44.00    Pack years: 66.00    Types: Cigarettes    Start date: 1974   Smokeless tobacco: Never Used   Tobacco comment: 1ppd as of 12/06/17 ep  Substance and Sexual Activity   Alcohol use: No   Drug use: No   Sexual activity: Not on file  Other Topics Concern  Not on file  Social History Narrative   Not on file   Social Determinants of Health   Financial Resource Strain:    Difficulty of Paying Living Expenses:   Food Insecurity:    Worried About Charity fundraiser in the Last Year:    Arboriculturist in the Last Year:   Transportation Needs:    Film/video editor (Medical):    Lack of Transportation (Non-Medical):   Physical Activity:    Days of Exercise per Week:    Minutes of Exercise per Session:   Stress:    Feeling of Stress :   Social Connections:    Frequency of  Communication with Friends and Family:    Frequency of Social Gatherings with Friends and Family:    Attends Religious Services:    Active Member of Clubs or Organizations:    Attends Archivist Meetings:    Marital Status:   Intimate Partner Violence:    Fear of Current or Ex-Partner:    Emotionally Abused:    Physically Abused:    Sexually Abused:     Physical Exam Vitals reviewed.  Constitutional:      Appearance: He is normal weight.  HENT:     Head: Normocephalic.     Nose: Nose normal.     Mouth/Throat:     Mouth: Mucous membranes are moist.  Eyes:     Pupils: Pupils are equal, round, and reactive to light.  Cardiovascular:     Rate and Rhythm: Normal rate and regular rhythm.     Pulses: Normal pulses.     Heart sounds: Normal heart sounds.  Pulmonary:     Effort: Pulmonary effort is normal.     Breath sounds: Normal breath sounds.  Abdominal:     General: Abdomen is flat.     Palpations: Abdomen is soft.  Musculoskeletal:        General: Normal range of motion.     Cervical back: Normal range of motion.     Right lower leg: No edema.     Left lower leg: No edema.  Skin:    General: Skin is warm and dry.     Capillary Refill: Capillary refill takes less than 2 seconds.  Neurological:     General: No focal deficit present.     Mental Status: He is alert. Mental status is at baseline.  Psychiatric:        Mood and Affect: Mood normal.     Arrived for home visit for Monnie who was seated outside in a chair on his porch alert and oriented with no reports of any complaints. Pranav stated he is feeling fine. Blakely is compliant with medications. Mcihael's medications were verified and confirmed. Pill box filled accordingly. Kyrell's vitals as noted. Guinn continued to agree with bubble packs being started. Burt requested visit with PCP, I made appointment for June 4th at 1:00. He understood and appointment card was made. Home visit complete. I will  continue to follow patient as needed.      I took medication to [place] and pharmacist approved medication and verified same to begin bubble packs.     No future appointments.   ACTION: Home visit completed Next visit planned for one week

## 2019-12-10 ENCOUNTER — Other Ambulatory Visit: Payer: Self-pay | Admitting: Cardiology

## 2019-12-11 ENCOUNTER — Other Ambulatory Visit (HOSPITAL_COMMUNITY): Payer: Self-pay

## 2019-12-11 NOTE — Progress Notes (Signed)
Saw Brentton in the home today where he was taught how to use his new medication bubble packs. Anshul verbalized understanding and taught back how to use them to me to confirm his understanding. Jove requested I see him once a month on the first Tuesday each month. I agreed. Home visit complete I will see Yosgar in one month.

## 2020-01-08 ENCOUNTER — Other Ambulatory Visit: Payer: Self-pay | Admitting: Cardiology

## 2020-01-08 ENCOUNTER — Telehealth (HOSPITAL_COMMUNITY): Payer: Self-pay

## 2020-01-08 NOTE — Telephone Encounter (Signed)
Spoke to Outlook today who informed me he has completed his 4 weeks of bubble packs and is doing well with same. He states he will be picking up next 4 weeks tomorrow from [place] at [place], I confirmed same with Christy Sartorius. Gerald Nelson was explained that we would be discharging him from paramedicine and he understood to call or reach out if the need arises. He agreed. Call complete.   Discharged at 1240 on 01/08/20.

## 2020-03-31 ENCOUNTER — Other Ambulatory Visit (HOSPITAL_COMMUNITY): Payer: Self-pay | Admitting: Cardiology

## 2020-04-01 ENCOUNTER — Ambulatory Visit (HOSPITAL_COMMUNITY)
Admission: RE | Admit: 2020-04-01 | Discharge: 2020-04-01 | Disposition: A | Discharge status: Home / Self Care | Payer: Medicare Other | Source: Ambulatory Visit | Attending: Internal Medicine | Admitting: Internal Medicine

## 2020-04-01 ENCOUNTER — Other Ambulatory Visit (HOSPITAL_COMMUNITY): Payer: Self-pay | Admitting: Cardiology

## 2020-04-01 ENCOUNTER — Other Ambulatory Visit: Payer: Self-pay

## 2020-04-01 ENCOUNTER — Ambulatory Visit (HOSPITAL_BASED_OUTPATIENT_CLINIC_OR_DEPARTMENT_OTHER)
Admission: RE | Admit: 2020-04-01 | Discharge: 2020-04-01 | Disposition: A | Discharge status: Home / Self Care | Payer: Medicare Other | Source: Ambulatory Visit | Attending: Cardiology | Admitting: Cardiology

## 2020-04-01 ENCOUNTER — Encounter (HOSPITAL_COMMUNITY): Payer: Self-pay | Admitting: Cardiology

## 2020-04-01 VITALS — BP 122/88 | HR 82 | Ht 70.0 in | Wt 130.4 lb

## 2020-04-01 DIAGNOSIS — F17210 Nicotine dependence, cigarettes, uncomplicated: Secondary | ICD-10-CM | POA: Diagnosis not present

## 2020-04-01 DIAGNOSIS — J449 Chronic obstructive pulmonary disease, unspecified: Secondary | ICD-10-CM | POA: Diagnosis not present

## 2020-04-01 DIAGNOSIS — Z7982 Long term (current) use of aspirin: Secondary | ICD-10-CM | POA: Diagnosis not present

## 2020-04-01 DIAGNOSIS — Z8249 Family history of ischemic heart disease and other diseases of the circulatory system: Secondary | ICD-10-CM | POA: Insufficient documentation

## 2020-04-01 DIAGNOSIS — Z79899 Other long term (current) drug therapy: Secondary | ICD-10-CM | POA: Diagnosis not present

## 2020-04-01 DIAGNOSIS — E785 Hyperlipidemia, unspecified: Secondary | ICD-10-CM

## 2020-04-01 DIAGNOSIS — F209 Schizophrenia, unspecified: Secondary | ICD-10-CM | POA: Insufficient documentation

## 2020-04-01 DIAGNOSIS — R0602 Shortness of breath: Secondary | ICD-10-CM | POA: Insufficient documentation

## 2020-04-01 DIAGNOSIS — I5022 Chronic systolic (congestive) heart failure: Secondary | ICD-10-CM

## 2020-04-01 DIAGNOSIS — I130 Hypertensive heart and chronic kidney disease with heart failure and stage 1 through stage 4 chronic kidney disease, or unspecified chronic kidney disease: Secondary | ICD-10-CM | POA: Insufficient documentation

## 2020-04-01 DIAGNOSIS — I340 Nonrheumatic mitral (valve) insufficiency: Secondary | ICD-10-CM | POA: Diagnosis not present

## 2020-04-01 DIAGNOSIS — I5042 Chronic combined systolic (congestive) and diastolic (congestive) heart failure: Secondary | ICD-10-CM | POA: Insufficient documentation

## 2020-04-01 DIAGNOSIS — Z7901 Long term (current) use of anticoagulants: Secondary | ICD-10-CM | POA: Diagnosis not present

## 2020-04-01 DIAGNOSIS — Z8673 Personal history of transient ischemic attack (TIA), and cerebral infarction without residual deficits: Secondary | ICD-10-CM | POA: Diagnosis not present

## 2020-04-01 DIAGNOSIS — N183 Chronic kidney disease, stage 3 unspecified: Secondary | ICD-10-CM | POA: Diagnosis not present

## 2020-04-01 DIAGNOSIS — I2510 Atherosclerotic heart disease of native coronary artery without angina pectoris: Secondary | ICD-10-CM | POA: Diagnosis not present

## 2020-04-01 DIAGNOSIS — I252 Old myocardial infarction: Secondary | ICD-10-CM | POA: Diagnosis not present

## 2020-04-01 DIAGNOSIS — I272 Pulmonary hypertension, unspecified: Secondary | ICD-10-CM | POA: Diagnosis not present

## 2020-04-01 LAB — BASIC METABOLIC PANEL
Anion gap: 10 (ref 5–15)
BUN: 25 mg/dL — ABNORMAL HIGH (ref 8–23)
CO2: 24 mmol/L (ref 22–32)
Calcium: 9.9 mg/dL (ref 8.9–10.3)
Chloride: 103 mmol/L (ref 98–111)
Creatinine, Ser: 1.86 mg/dL — ABNORMAL HIGH (ref 0.61–1.24)
GFR calc Af Amer: 42 mL/min — ABNORMAL LOW (ref >60)
GFR calc non Af Amer: 37 mL/min — ABNORMAL LOW (ref >60)
Glucose, Bld: 98 mg/dL (ref 70–99)
Potassium: 4.4 mmol/L (ref 3.5–5.1)
Sodium: 137 mmol/L (ref 135–145)

## 2020-04-01 LAB — LIPID PANEL
Cholesterol: 136 mg/dL (ref 0–200)
HDL: 70 mg/dL (ref >40)
LDL Cholesterol: 54 mg/dL (ref 0–99)
Total CHOL/HDL Ratio: 1.9 RATIO
Triglycerides: 59 mg/dL (ref <150)
VLDL: 12 mg/dL (ref 0–40)

## 2020-04-01 LAB — CBC
HCT: 39.9 % (ref 39.0–52.0)
Hemoglobin: 13.4 g/dL (ref 13.0–17.0)
MCH: 30.7 pg (ref 26.0–34.0)
MCHC: 33.6 g/dL (ref 30.0–36.0)
MCV: 91.5 fL (ref 80.0–100.0)
Platelets: 205 10*3/uL (ref 150–400)
RBC: 4.36 MIL/uL (ref 4.22–5.81)
RDW: 12.7 % (ref 11.5–15.5)
WBC: 7.3 10*3/uL (ref 4.0–10.5)
nRBC: 0 % (ref 0.0–0.2)

## 2020-04-01 LAB — ECHOCARDIOGRAM COMPLETE
Area-P 1/2: 2.87 cm2
S' Lateral: 2.6 cm

## 2020-04-01 NOTE — Patient Instructions (Addendum)
Labs done today, we will call you for abnormal results  You have been ordered a PYP Scan.  This is done in the Radiology Department of [facility]  When you come for this test please plan to be there 2-3 hours.   Please call our office in February 2022 to schedule your next appointment  If you have any questions or concerns before your next appointment please send [place] a message through [place] or call our office at [phone].    TO LEAVE A MESSAGE FOR THE NURSE SELECT OPTION 2, PLEASE LEAVE A MESSAGE INCLUDING:  YOUR NAME  DATE OF BIRTH  CALL BACK NUMBER  REASON FOR CALL**this is important as we prioritize the call backs  YOU WILL RECEIVE A CALL BACK THE SAME DAY AS LONG AS YOU CALL BEFORE 4:00 PM  At the [facility], you and your health needs are our priority. As part of our continuing mission to provide you with exceptional heart care, we have created designated Provider Care Teams. These Care Teams include your primary Cardiologist (physician) and Advanced Practice Providers (APPs- Physician Assistants and Nurse Practitioners) who all work together to provide you with the care you need, when you need it.   You may see any of the following providers on your designated Care Team at your next follow up:  Dr Glori Bickers  Dr Haynes Kerns, NP  Lyda Jester, [place]  Audry Riles, PharmD   Please be sure to bring in all your medications bottles to every appointment.

## 2020-04-01 NOTE — Progress Notes (Signed)
  Echocardiogram 2D Echocardiogram has been performed.  Gerald Nelson 04/01/2020, 1:52 PM

## 2020-04-02 ENCOUNTER — Encounter (HOSPITAL_COMMUNITY): Payer: Self-pay | Admitting: *Deleted

## 2020-04-02 NOTE — Progress Notes (Signed)
Date:  04/02/2020   ID:  Gerald Nelson, DOB 09/02/1950, MRN [id number]   Provider location: [facility] Type of Visit: Established patient   PCP:  [name], MD  Cardiologist:  Dr. Aundra Dubin  Chief Complaint: Shortness of breath   History of Present Illness: Gerald Nelson is a 69 y.o. male who has a past medical history of chronic systolic CHF (EF 50% in July 2018), severe MR/TR, pulmonary HTN, CVA, CKD, schizophrenia, HTN and tobacco abuse.   Gerald Nelson was admitted to [facility] 7/2-01/06/17 for newly diagnosed acute systolic CHF. Echo showed LVEF 15%, severe global hypokinesis, moderate LVH, coarsetrabeculation of the LV apex with numerous false tendinae of the left ventricle, very stagnant blood flow at the LV apex withsmoke but no obvious LV thrombus - Definity contrast was given,again, noting stagnant apical blood flow - this could suggestrecent thrombus and certainly high risk for apical thrombusformation. Other findings include aortic sclerosis with mild AI,moderate to severe MR, moderate LAE, mild RAE, moderate to severeTR, moderate to severe pulmonary hypertension (RVSP 73 mmHg), dilated IVC, trivial posterior pericardial effusion.   He was admitted again in 10/18. He diuresed 25 pounds on IV lasix. He refused cath. His clonidine and diltazem were stopped during this admission. Referred to paramedicine. Discharge weight 133 pounds.   Echo 08/11/17 LVEF 15-20%, no MR noted.   Echo in 7/20 showed improvement in EF to 50-55%, moderate LVH, normal RV size and systolic function.  Echo was done today and reviewed, EF 55% with moderate LVH, RV normal, IVC normal.   He presents today for followup of CHF.  He is still smoking 1 ppd but motivated to quit.  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  No chest pain.  No significant exertional dyspnea.  No orthopnea/PND.  No lightheadedness.  No claudication.   ECG (personally reviewed): NSR,  anteroseptal Qs  Labs (6/19): K 3.8, creatinine 2.2 Labs (11/19): K 4.1, creatinine 1.95, LDL 54, HDl 54 Labs (12/19): K 4.6, creatinine 1.89 LabS (2/20): K 4.5, creatinine 1.68  Labs (4/20): K 4.5, creatinine 1.58 Labs (9/20): K 5.1, creatinine 2.02 Labs (10/20): LDL 67, K 4.8, creatinine 1.92 Labs (3/21): K 4.8, creatinine 1.97  PMH: 1. Chronic systolic CHF:  Cardiomyopathy of uncertain etiology, refused cath. He also refused ICD.  - Echo (7/18): Moderate LVH, severe FBSH, trabeculation at apex not meeting criteria for noncompaction, EF 15%, moderate to severe MR, low normal RV systolic function, moderate-severe TR.  - Echo (2/19): Moderate LVH, EF 15-20%, No mitral regurgitation, normal RV size and systolic function, mild TR.  - Echo (7/20): EF 50-55%, moderate LVH, normal RV size and systolic function.  - Echo (9/21): EF 55%, moderate LVH, RV normal, IVC normal.  2. H/o CVA 3. Schizophrenia 4. CKD stage 3 5. Mitral regurgitation: Suspect functional. Moderate-severe MR on 7/18 echo, minimal MR on 2/19 echo.  6. COPD: He is an active smoker.  - PFTs (12/18) suggestive of severe COPD.  7. H/o HTN 8. ? apical mural thrombus: Not seen on most recent echo in 2/19.  9. CAD: 1/19 CT chest showed coronary calcification.   Current Outpatient Medications  Medication Sig Dispense Refill  . albuterol (PROVENTIL HFA;VENTOLIN HFA) 108 (90 Base) MCG/ACT inhaler Inhale 1-2 puffs into the lungs every 6 (six) hours as needed for wheezing or shortness of breath.    Marland Kitchen atorvastatin (LIPITOR) 40 MG tablet TAKE ONE TABLET BY MOUTH EVERY  DAY IN THE EVENING 30 tablet 6  . BIDIL 20-37.5 MG tablet TAKE 1 TABLET BY MOUTH 3 (THREE) TIMES DAILY. 180 tablet 6  . bisoprolol (ZEBETA) 10 MG tablet TAKE 1 TABLET (10 MG TOTAL) BY MOUTH DAILY. 90 tablet 3  . Fluticasone-Umeclidin-Vilant (TRELEGY ELLIPTA) 100-62.5-25 MCG/INH AEPB Inhale 1 puff into the lungs daily. 2 each 0  . haloperidol decanoate (HALDOL  DECANOATE) 100 MG/ML injection INJECT 1 ML EVERY 4 WEEKS    . losartan (COZAAR) 25 MG tablet TAKE 1 TABLET (25 MG TOTAL) BY MOUTH 2 (TWO) TIMES DAILY. 180 tablet 3  . potassium chloride SA (KLOR-CON) 20 MEQ tablet TAKE ONE TABLET BY MOUTH ONCE DAILY (AM) 90 tablet 3  . spironolactone (ALDACTONE) 25 MG tablet TAKE ONE TABLET BY MOUTH ONCE DAILY (BEDTIME) 30 tablet 5  . ASPIRIN LOW DOSE 81 MG EC tablet TAKE ONE TABLET BY MOUTH ONCE DAILY 90 tablet 3  . furosemide (LASIX) 20 MG tablet TAKE ONE TABLET( 20 MG TOTAL) BY MOUTH ONCE DAILY 30 tablet 6   Current Facility-Administered Medications  Medication Dose Route Frequency Provider Last Rate Last Admin  . methylPREDNISolone acetate (DEPO-MEDROL) injection 40 mg  40 mg Intra-articular Once [name], [name], MD        Allergies:   Sulfa antibiotics   Social History:  The patient  reports that he has been smoking cigarettes. He started smoking about 47 years ago. He has a 66.00 pack-year smoking history. He has never used smokeless tobacco. He reports that he does not drink alcohol and does not use drugs.   Family History:  The patient's family history includes Hypertension in his father and mother.   ROS:  Please see the history of present illness.   All other systems are personally reviewed and negative.   Exam:   BP 122/88 (BP Location: Left Arm, Patient Position: Sitting, Cuff Size: Normal)   Pulse 82   Ht 5\' 10"  (1.778 m)   Wt 59.1 kg (130 lb 6.4 oz)   SpO2 98%   BMI 18.71 kg/m  General: NAD Neck: No JVD, no thyromegaly or thyroid nodule.  Lungs: Distant breath sounds.  CV: Nondisplaced PMI.  Heart regular S1/S2, no S3/S4, no murmur.  No peripheral edema.  No carotid bruit.  Difficult to palpate pedal pulses.  Abdomen: Soft, nontender, no hepatosplenomegaly, no distention.  Skin: Intact without lesions or rashes.  Neurologic: Alert and oriented x 3.  Psych: Normal affect. Extremities: No clubbing or cyanosis.  HEENT: Normal.    Recent Labs: 04/01/2020: BUN 25; Creatinine, Ser 1.86; Hemoglobin 13.4; Platelets 205; Potassium 4.4; Sodium 137  Personally reviewed   Wt Readings from Last 3 Encounters:  04/01/20 59.1 kg (130 lb 6.4 oz)  12/04/19 62.6 kg (138 lb)  11/27/19 63.2 kg (139 lb 6.4 oz)      ASSESSMENT AND PLAN:  1. Chronic systolic => diastolic CHF: Echo 10/4313 EF 15% with moderate-severe MR. Echo 08/11/17 LVEF 15-20%, no MR noted. Cardiomyopathy of uncertain etiology, he has refused cath in the past.  Echo in 7/20 showed improvement in EF to 50-55%.  Echo today showed EF stable 55%, moderate LVH, normal RV.  NYHA class II symptoms.  Creatinine 1.97 at last check.  BP stable.  He looks euvolemic. With LVH and diastolic CHF, cardiac amyloidosis is a consideration.  - I will arrange for PYP scan to look for TTR cardiac amyloidosis and will send myeloma panel and urine immunofixation.  - He has been reluctant to have  heart cath, think we can hold off for now with improvement in EF.  - Continue Lasix 20 mg daily, BMET today.   - Continue bisoprolol 10 mg daily.   - Continue losartan 25 mg bid.  - Continue spironolactone 25 mg daily.  - Continue Bidil 1 tab tid.  - He is out of ICD range now, refused ICD in past.   2. Questionable apical thrombus: Not seen on 7/20 or 9/21 echoes. He is not anticoagulated.  3. Mitral regurgitation: Moderate to severe on 7/18 echo but trivial on 7/20 and 9/21 echoes.  Suspect functional MR, improved with improved LV function.  4. COPD: He continues to smoke.  Severe COPD by prior PFTs.  - I again encouraged him to quit, he is trying to cut back.  He has nicotine patches at home and plans to use them.    5. CKD stage III: BMET today.   6. Schizophrenia: Per PCP.  7. CAD: Noted on CT chest.  ECG with old anteroseptal MI, but echo shows EF back in normal range.  - Continue ASA 81.  - Continue atorvastatin, check lipids.   - As above, recommended cath in past for diagnosis of CAD  but he refused.  Think reasonable to hold off now with improved EF and no chest pain.    Followup in 6 months with NP/PA.   Signed, Loralie Champagne, MD  04/02/2020  [place] [address] [facility] [place] [address] [phone] (office) [phone] (fax)

## 2020-04-03 LAB — IMMUNOFIXATION, URINE

## 2020-04-04 LAB — MULTIPLE MYELOMA PANEL, SERUM
Albumin SerPl Elph-Mcnc: 4.1 g/dL (ref 2.9–4.4)
Albumin/Glob SerPl: 1.4 (ref 0.7–1.7)
Alpha 1: 0.2 g/dL (ref 0.0–0.4)
Alpha2 Glob SerPl Elph-Mcnc: 0.8 g/dL (ref 0.4–1.0)
B-Globulin SerPl Elph-Mcnc: 1 g/dL (ref 0.7–1.3)
Gamma Glob SerPl Elph-Mcnc: 1.2 g/dL (ref 0.4–1.8)
Globulin, Total: 3.1 g/dL (ref 2.2–3.9)
IgA: 428 mg/dL (ref 61–437)
IgG (Immunoglobin G), Serum: 1128 mg/dL (ref 603–1613)
IgM (Immunoglobulin M), Srm: 50 mg/dL (ref 20–172)
Total Protein ELP: 7.2 g/dL (ref 6.0–8.5)

## 2020-04-30 ENCOUNTER — Emergency Department (HOSPITAL_COMMUNITY): Payer: Medicare Other

## 2020-04-30 ENCOUNTER — Observation Stay (HOSPITAL_COMMUNITY)
Admission: EM | Admit: 2020-04-30 | Discharge: 2020-05-02 | Disposition: A | Discharge status: Home / Self Care | Payer: Medicare Other | Attending: Internal Medicine | Admitting: Internal Medicine

## 2020-04-30 ENCOUNTER — Encounter (HOSPITAL_COMMUNITY): Payer: Self-pay

## 2020-04-30 DIAGNOSIS — Z7982 Long term (current) use of aspirin: Secondary | ICD-10-CM | POA: Diagnosis not present

## 2020-04-30 DIAGNOSIS — J9620 Acute and chronic respiratory failure, unspecified whether with hypoxia or hypercapnia: Secondary | ICD-10-CM | POA: Insufficient documentation

## 2020-04-30 DIAGNOSIS — I10 Essential (primary) hypertension: Secondary | ICD-10-CM | POA: Diagnosis present

## 2020-04-30 DIAGNOSIS — Z20822 Contact with and (suspected) exposure to covid-19: Secondary | ICD-10-CM | POA: Diagnosis not present

## 2020-04-30 DIAGNOSIS — F17210 Nicotine dependence, cigarettes, uncomplicated: Secondary | ICD-10-CM | POA: Insufficient documentation

## 2020-04-30 DIAGNOSIS — J449 Chronic obstructive pulmonary disease, unspecified: Secondary | ICD-10-CM | POA: Diagnosis present

## 2020-04-30 DIAGNOSIS — J441 Chronic obstructive pulmonary disease with (acute) exacerbation: Principal | ICD-10-CM | POA: Insufficient documentation

## 2020-04-30 DIAGNOSIS — Z79899 Other long term (current) drug therapy: Secondary | ICD-10-CM | POA: Insufficient documentation

## 2020-04-30 DIAGNOSIS — N1832 Chronic kidney disease, stage 3b: Secondary | ICD-10-CM | POA: Diagnosis not present

## 2020-04-30 DIAGNOSIS — R079 Chest pain, unspecified: Secondary | ICD-10-CM

## 2020-04-30 DIAGNOSIS — E785 Hyperlipidemia, unspecified: Secondary | ICD-10-CM | POA: Diagnosis present

## 2020-04-30 DIAGNOSIS — R0602 Shortness of breath: Secondary | ICD-10-CM | POA: Diagnosis present

## 2020-04-30 DIAGNOSIS — Z720 Tobacco use: Secondary | ICD-10-CM | POA: Diagnosis present

## 2020-04-30 DIAGNOSIS — I130 Hypertensive heart and chronic kidney disease with heart failure and stage 1 through stage 4 chronic kidney disease, or unspecified chronic kidney disease: Secondary | ICD-10-CM | POA: Diagnosis not present

## 2020-04-30 DIAGNOSIS — R748 Abnormal levels of other serum enzymes: Secondary | ICD-10-CM | POA: Insufficient documentation

## 2020-04-30 DIAGNOSIS — I5032 Chronic diastolic (congestive) heart failure: Secondary | ICD-10-CM | POA: Diagnosis not present

## 2020-04-30 DIAGNOSIS — Z23 Encounter for immunization: Secondary | ICD-10-CM | POA: Insufficient documentation

## 2020-04-30 DIAGNOSIS — R778 Other specified abnormalities of plasma proteins: Secondary | ICD-10-CM

## 2020-04-30 DIAGNOSIS — F209 Schizophrenia, unspecified: Secondary | ICD-10-CM | POA: Diagnosis present

## 2020-04-30 DIAGNOSIS — I509 Heart failure, unspecified: Secondary | ICD-10-CM

## 2020-04-30 DIAGNOSIS — N183 Chronic kidney disease, stage 3 unspecified: Secondary | ICD-10-CM | POA: Diagnosis present

## 2020-04-30 LAB — BASIC METABOLIC PANEL
Anion gap: 10 (ref 5–15)
BUN: 32 mg/dL — ABNORMAL HIGH (ref 8–23)
CO2: 24 mmol/L (ref 22–32)
Calcium: 9.5 mg/dL (ref 8.9–10.3)
Chloride: 105 mmol/L (ref 98–111)
Creatinine, Ser: 1.72 mg/dL — ABNORMAL HIGH (ref 0.61–1.24)
GFR, Estimated: 43 mL/min — ABNORMAL LOW (ref >60)
Glucose, Bld: 89 mg/dL (ref 70–99)
Potassium: 4.1 mmol/L (ref 3.5–5.1)
Sodium: 139 mmol/L (ref 135–145)

## 2020-04-30 LAB — CBC
HCT: 40.5 % (ref 39.0–52.0)
Hemoglobin: 13.1 g/dL (ref 13.0–17.0)
MCH: 30.2 pg (ref 26.0–34.0)
MCHC: 32.3 g/dL (ref 30.0–36.0)
MCV: 93.3 fL (ref 80.0–100.0)
Platelets: 173 10*3/uL (ref 150–400)
RBC: 4.34 MIL/uL (ref 4.22–5.81)
RDW: 13.4 % (ref 11.5–15.5)
WBC: 7.7 10*3/uL (ref 4.0–10.5)
nRBC: 0 % (ref 0.0–0.2)

## 2020-04-30 MED ORDER — IPRATROPIUM BROMIDE HFA 17 MCG/ACT IN AERS
4.0000 | INHALATION_SPRAY | Freq: Once | RESPIRATORY_TRACT | Status: AC
  Administered 2020-05-01: 4 via RESPIRATORY_TRACT
  Filled 2020-04-30: qty 12.9

## 2020-04-30 MED ORDER — ALBUTEROL SULFATE HFA 108 (90 BASE) MCG/ACT IN AERS
8.0000 | INHALATION_SPRAY | Freq: Once | RESPIRATORY_TRACT | Status: AC
  Administered 2020-05-01: 8 via RESPIRATORY_TRACT
  Filled 2020-04-30: qty 6.7

## 2020-04-30 MED ORDER — PREDNISONE 20 MG PO TABS
60.0000 mg | ORAL_TABLET | Freq: Once | ORAL | Status: AC
  Administered 2020-05-01: 60 mg via ORAL
  Filled 2020-04-30: qty 3

## 2020-04-30 MED ORDER — AEROCHAMBER PLUS FLO-VU LARGE MISC: 1.0000 | Freq: Once | Status: AC

## 2020-04-30 NOTE — ED Triage Notes (Signed)
Pt reports that he has been SOB all day, hx of COPD, SOB is worse with exertion, denies CP

## 2020-05-01 ENCOUNTER — Encounter (HOSPITAL_COMMUNITY): Payer: Self-pay | Admitting: Emergency Medicine

## 2020-05-01 ENCOUNTER — Other Ambulatory Visit: Payer: Self-pay

## 2020-05-01 DIAGNOSIS — J441 Chronic obstructive pulmonary disease with (acute) exacerbation: Secondary | ICD-10-CM | POA: Diagnosis present

## 2020-05-01 LAB — TROPONIN I (HIGH SENSITIVITY)
Troponin I (High Sensitivity): 25 ng/L — ABNORMAL HIGH (ref <18)
Troponin I (High Sensitivity): 25 ng/L — ABNORMAL HIGH (ref <18)

## 2020-05-01 LAB — HIV ANTIBODY (ROUTINE TESTING W REFLEX): HIV Screen 4th Generation wRfx: NONREACTIVE

## 2020-05-01 LAB — RESPIRATORY PANEL BY RT PCR (FLU A&B, COVID)
Influenza A by PCR: NEGATIVE
Influenza B by PCR: NEGATIVE
SARS Coronavirus 2 by RT PCR: NEGATIVE

## 2020-05-01 MED ORDER — DOXYCYCLINE HYCLATE 100 MG PO TABS
100.0000 mg | ORAL_TABLET | Freq: Two times a day (BID) | ORAL | Status: DC
  Administered 2020-05-01 – 2020-05-02 (×3): 100 mg via ORAL
  Filled 2020-05-01 (×3): qty 1

## 2020-05-01 MED ORDER — IPRATROPIUM BROMIDE 0.02 % IN SOLN
0.5000 mg | Freq: Once | RESPIRATORY_TRACT | Status: AC
  Administered 2020-05-01: 0.5 mg via RESPIRATORY_TRACT
  Filled 2020-05-01: qty 2.5

## 2020-05-01 MED ORDER — IPRATROPIUM-ALBUTEROL 0.5-2.5 (3) MG/3ML IN SOLN
3.0000 mL | Freq: Four times a day (QID) | RESPIRATORY_TRACT | Status: DC
  Administered 2020-05-01 – 2020-05-02 (×3): 3 mL via RESPIRATORY_TRACT
  Filled 2020-05-01 (×3): qty 3

## 2020-05-01 MED ORDER — HYDROXYZINE PAMOATE 25 MG PO CAPS
25.0000 mg | ORAL_CAPSULE | Freq: Two times a day (BID) | ORAL | Status: DC
  Filled 2020-05-01: qty 1

## 2020-05-01 MED ORDER — PREDNISONE 20 MG PO TABS
40.0000 mg | ORAL_TABLET | Freq: Every day | ORAL | Status: DC
  Administered 2020-05-02: 40 mg via ORAL
  Filled 2020-05-01: qty 2

## 2020-05-01 MED ORDER — ASPIRIN EC 81 MG PO TBEC: 81.0000 mg | DELAYED_RELEASE_TABLET | Freq: Every day | ORAL | Status: DC

## 2020-05-01 MED ORDER — LACTATED RINGERS IV SOLN: INTRAVENOUS | Status: DC

## 2020-05-01 MED ORDER — SPIRONOLACTONE 25 MG PO TABS
25.0000 mg | ORAL_TABLET | Freq: Every day | ORAL | Status: DC
  Administered 2020-05-01: 25 mg via ORAL
  Filled 2020-05-01 (×2): qty 1

## 2020-05-01 MED ORDER — AEROCHAMBER PLUS FLO-VU LARGE MISC
Status: AC
  Administered 2020-05-01: 1
  Filled 2020-05-01: qty 1

## 2020-05-01 MED ORDER — FLUTICASONE FUROATE-VILANTEROL 100-25 MCG/INH IN AEPB
1.0000 | INHALATION_SPRAY | Freq: Every day | RESPIRATORY_TRACT | Status: DC
  Administered 2020-05-02: 1 via RESPIRATORY_TRACT
  Filled 2020-05-01: qty 28

## 2020-05-01 MED ORDER — ISOSORB DINITRATE-HYDRALAZINE 20-37.5 MG PO TABS
1.0000 | ORAL_TABLET | Freq: Three times a day (TID) | ORAL | Status: DC
  Administered 2020-05-01 – 2020-05-02 (×3): 1 via ORAL
  Filled 2020-05-01 (×6): qty 1

## 2020-05-01 MED ORDER — HYDROXYZINE HCL 25 MG PO TABS
25.0000 mg | ORAL_TABLET | Freq: Two times a day (BID) | ORAL | Status: DC
  Administered 2020-05-01 – 2020-05-02 (×3): 25 mg via ORAL
  Filled 2020-05-01 (×3): qty 1

## 2020-05-01 MED ORDER — ASPIRIN EC 81 MG PO TBEC
81.0000 mg | DELAYED_RELEASE_TABLET | Freq: Every day | ORAL | Status: DC
  Administered 2020-05-02: 81 mg via ORAL
  Filled 2020-05-01: qty 1

## 2020-05-01 MED ORDER — PNEUMOCOCCAL VAC POLYVALENT 25 MCG/0.5ML IJ INJ: 0.5000 mL | INJECTION | INTRAMUSCULAR | Status: DC

## 2020-05-01 MED ORDER — BISOPROLOL FUMARATE 5 MG PO TABS
10.0000 mg | ORAL_TABLET | Freq: Every day | ORAL | Status: DC
  Administered 2020-05-02: 10 mg via ORAL
  Filled 2020-05-01: qty 1
  Filled 2020-05-01: qty 2

## 2020-05-01 MED ORDER — NICOTINE 21 MG/24HR TD PT24
21.0000 mg | MEDICATED_PATCH | Freq: Every day | TRANSDERMAL | Status: DC
  Administered 2020-05-01 – 2020-05-02 (×2): 21 mg via TRANSDERMAL
  Filled 2020-05-01 (×2): qty 1

## 2020-05-01 MED ORDER — ENOXAPARIN SODIUM 40 MG/0.4ML ~~LOC~~ SOLN
40.0000 mg | SUBCUTANEOUS | Status: DC
  Administered 2020-05-01 – 2020-05-02 (×2): 40 mg via SUBCUTANEOUS
  Filled 2020-05-01 (×2): qty 0.4

## 2020-05-01 MED ORDER — INFLUENZA VAC A&B SA ADJ QUAD 0.5 ML IM PRSY
0.5000 mL | PREFILLED_SYRINGE | INTRAMUSCULAR | Status: AC
  Administered 2020-05-02: 0.5 mL via INTRAMUSCULAR
  Filled 2020-05-01: qty 0.5

## 2020-05-01 MED ORDER — SODIUM CHLORIDE 0.9% FLUSH
3.0000 mL | Freq: Two times a day (BID) | INTRAVENOUS | Status: DC
  Administered 2020-05-01 – 2020-05-02 (×3): 3 mL via INTRAVENOUS

## 2020-05-01 MED ORDER — HALOPERIDOL 5 MG PO TABS
5.0000 mg | ORAL_TABLET | Freq: Two times a day (BID) | ORAL | Status: DC
  Administered 2020-05-01 – 2020-05-02 (×3): 5 mg via ORAL
  Filled 2020-05-01 (×4): qty 1

## 2020-05-01 MED ORDER — METHYLPREDNISOLONE SODIUM SUCC 125 MG IJ SOLR
60.0000 mg | Freq: Two times a day (BID) | INTRAMUSCULAR | Status: AC
  Administered 2020-05-01 (×2): 60 mg via INTRAVENOUS
  Filled 2020-05-01 (×2): qty 2

## 2020-05-01 MED ORDER — ALBUTEROL SULFATE (2.5 MG/3ML) 0.083% IN NEBU
5.0000 mg | INHALATION_SOLUTION | Freq: Once | RESPIRATORY_TRACT | Status: AC
  Administered 2020-05-01: 5 mg via RESPIRATORY_TRACT
  Filled 2020-05-01: qty 6

## 2020-05-01 MED ORDER — FLUTICASONE-UMECLIDIN-VILANT 100-62.5-25 MCG/INH IN AEPB: 1.0000 | INHALATION_SPRAY | Freq: Every day | RESPIRATORY_TRACT | Status: DC

## 2020-05-01 MED ORDER — ALBUTEROL SULFATE (2.5 MG/3ML) 0.083% IN NEBU: 2.5000 mg | INHALATION_SOLUTION | RESPIRATORY_TRACT | Status: DC | PRN

## 2020-05-01 MED ORDER — ASPIRIN 81 MG PO CHEW
324.0000 mg | CHEWABLE_TABLET | Freq: Once | ORAL | Status: AC
  Administered 2020-05-01: 324 mg via ORAL
  Filled 2020-05-01: qty 4

## 2020-05-01 MED ORDER — ATORVASTATIN CALCIUM 40 MG PO TABS
40.0000 mg | ORAL_TABLET | Freq: Every evening | ORAL | Status: DC
  Administered 2020-05-01: 40 mg via ORAL
  Filled 2020-05-01: qty 1

## 2020-05-01 MED ORDER — UMECLIDINIUM BROMIDE 62.5 MCG/INH IN AEPB
1.0000 | INHALATION_SPRAY | Freq: Every day | RESPIRATORY_TRACT | Status: DC
  Administered 2020-05-02: 1 via RESPIRATORY_TRACT
  Filled 2020-05-01: qty 7

## 2020-05-01 NOTE — Progress Notes (Signed)
Denies difficulty swallowing.

## 2020-05-01 NOTE — ED Notes (Signed)
Pt placed on 2L [place] r/t saturations dropping down to 83 while sleeping

## 2020-05-01 NOTE — Progress Notes (Signed)
Live in home in rooming home. Will need help with Medication and transportation

## 2020-05-01 NOTE — Progress Notes (Signed)
Pt do not know the phone number to call

## 2020-05-01 NOTE — Evaluation (Signed)
Physical Therapy Evaluation Patient Details Name: Gerald Nelson MRN: [id number] DOB: [birth year] Today's Date: 05/01/2020   History of Present Illness  Pt is a 69 y/o male admitted secondary to worsening SOB. Thought to be secondary to COPD exacerbation. PMH includes CHF, CKD, COPD, HTN, Schizophrenia, and tobacco use.   Clinical Impression  Pt admitted secondary to problem above with deficits below. Requiring min guard assist for mobility tasks. Noted memory deficits and decreased awareness of safety throughout mobility. Pt reports he rents a room in a home and has one other roommate. Feel he would benefit from HHPT to ensure safety at home, however, pt may refuse. Will continue to follow acutely.     Follow Up Recommendations Home health PT    Equipment Recommendations  None recommended by PT    Recommendations for Other Services       Precautions / Restrictions Precautions Precautions: Fall Restrictions Weight Bearing Restrictions: No      Mobility  Bed Mobility Overal bed mobility: Needs Assistance Bed Mobility: Supine to Sit;Sit to Supine     Supine to sit: Supervision Sit to supine: Supervision   General bed mobility comments: supervision for safety.     Transfers Overall transfer level: Needs assistance Equipment used: None Transfers: Sit to/from Stand Sit to Stand: Min guard         General transfer comment: min guard for safety.   Ambulation/Gait Ambulation/Gait assistance: Min guard Gait Distance (Feet): 100 Feet Assistive device: IV Pole Gait Pattern/deviations: Step-through pattern;Decreased stride length Gait velocity: Decreased   General Gait Details: Mildly unsteady gait. Required min guard for safety. Notable SOB, however, pt reporting he was not. "Plopped" onto stretcher upon return for rest break. Educated about pursed lip breathing. Oxygen sats at 97-98% on RA.   Stairs            Wheelchair Mobility    Modified Rankin (Stroke  Patients Only)       Balance Overall balance assessment: Mild deficits observed, not formally tested                                           Pertinent Vitals/Pain Pain Assessment: No/denies pain    Home Living Family/patient expects to be discharged to:: Private residence Living Arrangements: Non-relatives/Friends (roomates) Available Help at Discharge: Friend(s);Available PRN/intermittently Type of Home: House Home Access: Stairs to enter Entrance Stairs-Rails: None Entrance Stairs-Number of Steps: 3-4 Home Layout: One level Home Equipment: None Additional Comments: Rents a room     Prior Function Level of Independence: Independent               Hand Dominance        Extremity/Trunk Assessment   Upper Extremity Assessment Upper Extremity Assessment: Defer to OT evaluation    Lower Extremity Assessment Lower Extremity Assessment: Generalized weakness    Cervical / Trunk Assessment Cervical / Trunk Assessment: Normal  Communication   Communication: No difficulties  Cognition Arousal/Alertness: Awake/alert Behavior During Therapy: WFL for tasks assessed/performed Overall Cognitive Status: No family/caregiver present to determine baseline cognitive functioning                                 General Comments: Pt asking to go look out window at the end of the hall, but was actually looking at door of patients room.  Pt reports "I'm sometimes off".       General Comments      Exercises     Assessment/Plan    PT Assessment Patient needs continued PT services  PT Problem List Decreased strength;Decreased balance;Decreased mobility;Decreased activity tolerance       PT Treatment Interventions DME instruction;Gait training;Therapeutic activities;Functional mobility training;Balance training;Therapeutic exercise;Patient/family education;Cognitive remediation    PT Goals (Current goals can be found in the Care Plan  section)  Acute Rehab PT Goals Patient Stated Goal: to go home PT Goal Formulation: With patient Time For Goal Achievement: 05/15/20 Potential to Achieve Goals: Good    Frequency Min 3X/week   Barriers to discharge Decreased caregiver support      Co-evaluation               AM-PAC PT "6 Clicks" Mobility  Outcome Measure Help needed turning from your back to your side while in a flat bed without using bedrails?: None Help needed moving from lying on your back to sitting on the side of a flat bed without using bedrails?: None Help needed moving to and from a bed to a chair (including a wheelchair)?: A Little Help needed standing up from a chair using your arms (e.g., wheelchair or bedside chair)?: A Little Help needed to walk in hospital room?: A Little Help needed climbing 3-5 steps with a railing? : A Little 6 Click Score: 20    End of Session Equipment Utilized During Treatment: Gait belt Activity Tolerance: Patient tolerated treatment well Patient left: in bed;with call bell/phone within reach (on stretcher in ED ) Nurse Communication: Mobility status PT Visit Diagnosis: Other abnormalities of gait and mobility (R26.89)    Time: 7342-8768 PT Time Calculation (min) (ACUTE ONLY): 10 min   Charges:   PT Evaluation $PT Eval Moderate Complexity: 1 Mod          [name], DPT  Acute Rehabilitation Services  Pager: [phone] Office: [phone]   Rudean Hitt 05/01/2020, 5:04 PM

## 2020-05-01 NOTE — Progress Notes (Signed)
Newly admitted Patient is alert oriented calm follows commands, vital; stable with congested cough, Oriented to environment , snack provided

## 2020-05-01 NOTE — ED Provider Notes (Signed)
[facility] EMERGENCY DEPARTMENT Provider Note   CSN: [id number] Arrival date & time: 04/30/20  1855     History Chief Complaint  Patient presents with  . Shortness of Breath    Gerald Nelson is a 69 y.o. male.  The history is provided by the patient.  Shortness of Breath Severity:  Moderate Onset quality:  Gradual Duration:  1 day Timing:  Constant Progression:  Unchanged Chronicity:  Recurrent Context: activity   Context: not URI   Relieved by:  Nothing Worsened by:  Nothing Ineffective treatments:  None tried Associated symptoms: chest pain and wheezing   Associated symptoms: no abdominal pain, no diaphoresis, no fever, no neck pain and no PND   Risk factors: no recent alcohol use and no obesity   Patient with COPD presents with chest pain and SOB and wheezing that started while exerting himself yesterday.  No n/v/d.  No f/c/r.  IS vaccinated.       Past Medical History:  Diagnosis Date  . Apical mural thrombus    a. question of apical thrombus on echo 01/2017, pt left AMA, not felt to be anticoag candidate with noncompliance.  . Arthritis   . CHF (congestive heart failure) ([place])   . Chronic systolic CHF (congestive heart failure) ([place])    a. pt refused cath. EF 15% 01/2017.  . CKD (chronic kidney disease), stage III ([place])   . COPD (chronic obstructive pulmonary disease) ([place])   . Hernia, hiatal   . Hyperlipidemia   . Hypertension   . Hyponatremia   . Mitral regurgitation    a. mod-severe by echo 01/2017.  . Pulmonary hypertension ([place])   . Schizophrenia ([place])   . Tobacco abuse     Patient Active Problem List   Diagnosis Date Noted  . Stage 3 severe COPD by GOLD classification ([place]) 11/14/2018  . Noncompliance 04/23/2017  . Mitral regurgitation 04/23/2017  . Thrombus - possible apical thrombus 01/2017 04/23/2017  . Hyperlipidemia 04/23/2017  . Pulmonary hypertension ([place])   . SOB (shortness of breath)   . Palliative care by  specialist   . Tobacco abuse 04/19/2017  . Acute respiratory failure with hypoxia ([place]) 01/27/2017  . Hypertensive urgency 01/27/2017  . CHF (congestive heart failure) ([place]) 01/26/2017  . CKD (chronic kidney disease), stage III ([place]) 01/04/2017  . Essential hypertension 01/04/2017  . CVA (cerebral infarction) 12/12/2013  . Hyponatremia 12/06/2012  . Hiatal hernia 12/06/2012  . Syncope 12/06/2012  . Schizophrenia [facility])     Past Surgical History:  Procedure Laterality Date  . PENILE PROSTHESIS IMPLANT         Family History  Problem Relation Age of Onset  . Hypertension Mother   . Hypertension Father     Social History   Tobacco Use  . Smoking status: Current Every Day Smoker    Packs/day: 1.50    Years: 44.00    Pack years: 66.00    Types: Cigarettes    Start date: 96  . Smokeless tobacco: Never Used  . Tobacco comment: 1ppd as of 12/06/17 ep  Vaping Use  . Vaping Use: Never used  Substance Use Topics  . Alcohol use: No  . Drug use: No    Home Medications Prior to Admission medications   Medication Sig Start Date End Date Taking? Authorizing Provider  albuterol (PROVENTIL HFA;VENTOLIN HFA) 108 (90 Base) MCG/ACT inhaler Inhale 1-2 puffs into the lungs every 6 (six) hours as needed for wheezing or shortness of breath.  [provider]  ASPIRIN LOW DOSE 81 MG EC tablet TAKE ONE TABLET BY MOUTH ONCE DAILY 04/01/20   [name], MD  atorvastatin (LIPITOR) 40 MG tablet TAKE ONE TABLET BY MOUTH EVERY DAY IN THE EVENING 01/08/20   [name], MD  BIDIL 20-37.5 MG tablet TAKE 1 TABLET BY MOUTH 3 (THREE) TIMES DAILY. 01/08/20   [name], MD  bisoprolol (ZEBETA) 10 MG tablet TAKE 1 TABLET (10 MG TOTAL) BY MOUTH DAILY. 11/07/19   [name], MD  Fluticasone-Umeclidin-Vilant (TRELEGY ELLIPTA) 100-62.5-25 MCG/INH AEPB Inhale 1 puff into the lungs daily. 03/21/19   Martyn Ehrich, NP  furosemide (LASIX) 20 MG tablet TAKE ONE TABLET( 20 MG TOTAL)  BY MOUTH ONCE DAILY 04/01/20   [name], MD  haloperidol decanoate (HALDOL DECANOATE) 100 MG/ML injection INJECT 1 ML EVERY 4 WEEKS 08/07/18   [provider]  losartan (COZAAR) 25 MG tablet TAKE 1 TABLET (25 MG TOTAL) BY MOUTH 2 (TWO) TIMES DAILY. 08/22/19   [name], MD  potassium chloride SA (KLOR-CON) 20 MEQ tablet TAKE ONE TABLET BY MOUTH ONCE DAILY (AM) 10/29/19   [name], MD  spironolactone (ALDACTONE) 25 MG tablet TAKE ONE TABLET BY MOUTH ONCE DAILY (BEDTIME) 12/10/19   Bensimhon, Shaune Pascal, MD    Allergies    Sulfa antibiotics  Review of Systems   Review of Systems  Constitutional: Negative for diaphoresis and fever.  HENT: Negative for congestion.   Eyes: Negative for visual disturbance.  Respiratory: Positive for shortness of breath and wheezing.   Cardiovascular: Positive for chest pain. Negative for PND.  Gastrointestinal: Negative for abdominal pain.  Genitourinary: Negative for difficulty urinating.  Musculoskeletal: Negative for neck pain.  Neurological: Negative for dizziness.  Psychiatric/Behavioral: Negative for agitation.  All other systems reviewed and are negative.   Physical Exam Updated Vital Signs BP 125/85   Pulse 80   Temp 98.1 F (36.7 C) (Oral)   Resp 18   SpO2 98%   Physical Exam Vitals and nursing note reviewed.  Constitutional:      General: He is not in acute distress.    Appearance: Normal appearance.  HENT:     Head: Normocephalic and atraumatic.     Nose: Nose normal.  Eyes:     Conjunctiva/sclera: Conjunctivae normal.     Pupils: Pupils are equal, round, and reactive to light.  Cardiovascular:     Rate and Rhythm: Normal rate and regular rhythm.     Pulses: Normal pulses.     Heart sounds: Normal heart sounds.  Pulmonary:     Effort: No respiratory distress.     Breath sounds: Wheezing present.  Abdominal:     General: Abdomen is flat. Bowel sounds are normal.     Tenderness: There is no abdominal  tenderness. There is no guarding.  Musculoskeletal:        General: Normal range of motion.  Skin:    General: Skin is warm and dry.     Capillary Refill: Capillary refill takes less than 2 seconds.  Neurological:     General: No focal deficit present.     Mental Status: He is alert and oriented to person, place, and time.     Deep Tendon Reflexes: Reflexes normal.  Psychiatric:        Mood and Affect: Mood normal.        Behavior: Behavior normal.     ED Results / Procedures / Treatments  Labs (all labs ordered are listed, but only abnormal results are displayed) Results for orders placed or performed during the hospital encounter of 04/30/20  Respiratory Panel by RT PCR (Flu A&B, Covid) - Nasopharyngeal Swab   Specimen: Nasopharyngeal Swab  Result Value Ref Range   SARS Coronavirus 2 by RT PCR NEGATIVE NEGATIVE   Influenza A by PCR NEGATIVE NEGATIVE   Influenza B by PCR NEGATIVE NEGATIVE  CBC  Result Value Ref Range   WBC 7.7 4.0 - 10.5 K/uL   RBC 4.34 4.22 - 5.81 MIL/uL   Hemoglobin 13.1 13.0 - 17.0 g/dL   HCT 40.5 39 - 52 %   MCV 93.3 80.0 - 100.0 fL   MCH 30.2 26.0 - 34.0 pg   MCHC 32.3 30.0 - 36.0 g/dL   RDW 13.4 11.5 - 15.5 %   Platelets 173 150 - 400 K/uL   nRBC 0.0 0.0 - 0.2 %  Basic metabolic panel  Result Value Ref Range   Sodium 139 135 - 145 mmol/L   Potassium 4.1 3.5 - 5.1 mmol/L   Chloride 105 98 - 111 mmol/L   CO2 24 22 - 32 mmol/L   Glucose, Bld 89 70 - 99 mg/dL   BUN 32 (H) 8 - 23 mg/dL   Creatinine, Ser 1.72 (H) 0.61 - 1.24 mg/dL   Calcium 9.5 8.9 - 10.3 mg/dL   GFR, Estimated 43 (L) >60 mL/min   Anion gap 10 5 - 15  Troponin I (High Sensitivity)  Result Value Ref Range   Troponin I (High Sensitivity) 25 (H) <18 ng/L   DG Chest 2 View  Result Date: 04/30/2020 CLINICAL DATA:  Shortness of breath EXAM: CHEST - 2 VIEW COMPARISON:  03/31/2019 FINDINGS: Hyperinflated lungs with emphysematous disease. No acute consolidation or effusion. Stable  cardiomediastinal silhouette. No pneumothorax. Degenerative changes of the spine. IMPRESSION: No active cardiopulmonary disease. Hyperinflation with emphysematous disease. Electronically Signed   By: Donavan Foil M.D.   On: 04/30/2020 20:51   ECHOCARDIOGRAM COMPLETE  Result Date: 04/01/2020    ECHOCARDIOGRAM REPORT   Patient Name:   MOUSTAFA MOSSA Date of Exam: 04/01/2020 Medical Rec #:  [id number]      Height:       70.0 in Accession #:    [id number]     Weight:       138.0 lb Date of Birth:  09-19-1950       BSA:          1.783 m Patient Age:    34 years       BP:           130/90 mmHg Patient Gender: M              HR:           77 bpm. Exam Location:  Outpatient Procedure: 2D Echo Indications:    pulmonary hypertension 416.8  History:        Patient has prior history of Echocardiogram examinations, most                 recent 01/15/2019. CHF, COPD; Risk Factors:Dyslipidemia. Hx of                 apical thrombus. pulmonary hypertension. MR. tobacco abuse.  Sonographer:    Jannett Celestine RDCS (AE) Referring Phys: [place]  1. Left ventricular ejection fraction, by estimation, is 55%. The left ventricle has normal function. The left ventricle has no regional wall  motion abnormalities. There is moderate left ventricular hypertrophy. Left ventricular diastolic parameters are  consistent with Grade I diastolic dysfunction (impaired relaxation).  2. Right ventricular systolic function is normal. The right ventricular size is normal. There is normal pulmonary artery systolic pressure. The estimated right ventricular systolic pressure is 67.6 mmHg.  3. The mitral valve is normal in structure. No evidence of mitral valve regurgitation. No evidence of mitral stenosis.  4. The aortic valve is tricuspid. Aortic valve regurgitation is not visualized. No aortic stenosis is present.  5. Aortic dilatation noted. There is mild dilatation of the aortic root, measuring 37 mm.  6. The inferior vena cava is  normal in size with greater than 50% respiratory variability, suggesting right atrial pressure of 3 mmHg. FINDINGS  Left Ventricle: Left ventricular ejection fraction, by estimation, is 55%. The left ventricle has normal function. The left ventricle has no regional wall motion abnormalities. The left ventricular internal cavity size was normal in size. There is moderate left ventricular hypertrophy. Left ventricular diastolic parameters are consistent with Grade I diastolic dysfunction (impaired relaxation). Right Ventricle: The right ventricular size is normal. No increase in right ventricular wall thickness. Right ventricular systolic function is normal. There is normal pulmonary artery systolic pressure. The tricuspid regurgitant velocity is 2.17 m/s, and  with an assumed right atrial pressure of 3 mmHg, the estimated right ventricular systolic pressure is 72.0 mmHg. Left Atrium: Left atrial size was normal in size. Right Atrium: Right atrial size was normal in size. Pericardium: There is no evidence of pericardial effusion. Mitral Valve: The mitral valve is normal in structure. No evidence of mitral valve regurgitation. No evidence of mitral valve stenosis. Tricuspid Valve: The tricuspid valve is normal in structure. Tricuspid valve regurgitation is trivial. Aortic Valve: The aortic valve is tricuspid. Aortic valve regurgitation is not visualized. No aortic stenosis is present. Pulmonic Valve: The pulmonic valve was normal in structure. Pulmonic valve regurgitation is not visualized. No evidence of pulmonic stenosis. Aorta: Aortic dilatation noted. There is mild dilatation of the aortic root, measuring 37 mm. Venous: The inferior vena cava is normal in size with greater than 50% respiratory variability, suggesting right atrial pressure of 3 mmHg. IAS/Shunts: No atrial level shunt detected by color flow Doppler.  LEFT VENTRICLE PLAX 2D LVIDd:         3.60 cm  Diastology LVIDs:         2.60 cm  LV e' medial:     5.44 cm/s LV PW:         1.20 cm  LV E/e' medial:  8.2 LV IVS:        1.20 cm  LV e' lateral:   7.40 cm/s LVOT diam:     2.30 cm  LV E/e' lateral: 6.0 LV SV:         68 LV SV Index:   38 LVOT Area:     4.15 cm  RIGHT VENTRICLE TAPSE (M-mode): 1.5 cm LEFT ATRIUM           Index LA diam:      3.50 cm 1.96 cm/m LA Vol (A2C): 26.4 ml 14.81 ml/m  AORTIC VALVE LVOT Vmax:   74.40 cm/s LVOT Vmean:  56.100 cm/s LVOT VTI:    0.163 m  AORTA Ao Root diam: 3.50 cm MITRAL VALVE               TRICUSPID VALVE MV Area (PHT): 2.87 cm    TR Peak grad:   18.8 mmHg  MV Decel Time: 264 msec    TR Vmax:        217.00 cm/s MV E velocity: 44.60 cm/s MV A velocity: 67.30 cm/s  SHUNTS MV E/A ratio:  0.66        Systemic VTI:  0.16 m                            Systemic Diam: 2.30 cm Loralie Champagne MD Electronically signed by Loralie Champagne MD Signature Date/Time: 04/01/2020/2:16:45 PM    Final     EKG EKG Interpretation  Date/Time:  Wednesday April 30 2020 20:35:38 EDT Ventricular Rate:  81 PR Interval:  174 QRS Duration: 90 QT Interval:  386 QTC Calculation: 448 R Axis:   71 Text Interpretation: Sinus rhythm with Premature atrial complexes Confirmed by Dory Horn) on 04/30/2020 11:39:37 PM   Radiology DG Chest 2 View  Result Date: 04/30/2020 CLINICAL DATA:  Shortness of breath EXAM: CHEST - 2 VIEW COMPARISON:  03/31/2019 FINDINGS: Hyperinflated lungs with emphysematous disease. No acute consolidation or effusion. Stable cardiomediastinal silhouette. No pneumothorax. Degenerative changes of the spine. IMPRESSION: No active cardiopulmonary disease. Hyperinflation with emphysematous disease. Electronically Signed   By: Donavan Foil M.D.   On: 04/30/2020 20:51    Procedures Procedures (including critical care time)  Medications Ordered in ED Medications  AeroChamber Plus Flo-Vu Large MISC 1 each (has no administration in time range)  albuterol (PROVENTIL) (2.5 MG/3ML) 0.083% nebulizer solution 5 mg (has  no administration in time range)  ipratropium (ATROVENT) nebulizer solution 0.5 mg (has no administration in time range)  aspirin chewable tablet 324 mg (has no administration in time range)  albuterol (VENTOLIN HFA) 108 (90 Base) MCG/ACT inhaler 8 puff (8 puffs Inhalation Given 05/01/20 0008)  ipratropium (ATROVENT HFA) inhaler 4 puff (4 puffs Inhalation Given 05/01/20 0008)  predniSONE (DELTASONE) tablet 60 mg (60 mg Oral Given 05/01/20 0008)    ED Course  I have reviewed the triage vital signs and the nursing notes.  Pertinent labs & imaging results that were available during my care of the patient were reviewed by me and considered in my medical decision making (see chart for details).    Patient stopped wheezin post treatment and then wheezing returned while resting.  Nebs initiated  410 Case d/w Dr. Dr. Paticia Stack of cardiology.  Admit to medicine for rule out  Final Clinical Impression(s) / ED Diagnoses Admit to medicine    Makenzi Bannister, MD 05/01/20 0940

## 2020-05-01 NOTE — H&P (Signed)
History and Physical    Gerald Nelson:[id number] DOB: 03/04/1951 DOA: 04/30/2020  PCP: [name], MD Consultants:  Aundra Dubin - cardiology; Chase Caller - pulmonology Patient coming from:  Home - lives with roommate; NOK: [place], [phone]   Chief Complaint: SOB  HPI: Gerald Nelson is a 69 y.o. male with medical history significant of schizophrenia; pulmonary HTN; HTN; HLD; COPD; stage 3 CKD; and chronic systolic CHF presenting with SOB.  He reports that he was at work ([employer]) and his supervisor came to check on him - he wasn't sure why.  A student came up and did the same thing.  He was having heavy breathing and not functioning well.  No Cough, + wheezing.  No fever.  No recent URI.  He feels fine now.    ED Course:  Carryover, per Dr. Hal Hope:  69 year old male with known history of chronic systolic heart failure, pulmonary hypertension presents with shortness of breath is found to be diffusely wheezing and ER physician feels patient has COPD exacerbation. Also had complained of some chest pain. Admitted for further observation. Cardiology was notified by the ER physician.   Review of Systems: As per HPI; otherwise review of systems reviewed and negative.   Ambulatory Status:  Ambulates without assistance  COVID Vaccine Status:   Complete  Past Medical History:  Diagnosis Date  . Apical mural thrombus    a. question of apical thrombus on echo 01/2017, pt left AMA, not felt to be anticoag candidate with noncompliance.  . Arthritis   . CHF (congestive heart failure) ([place])   . Chronic systolic CHF (congestive heart failure) ([place])    a. pt refused cath. EF 15% 01/2017.  . CKD (chronic kidney disease), stage III ([place])   . COPD (chronic obstructive pulmonary disease) ([place])   . Hernia, hiatal   . Hyperlipidemia   . Hypertension   . Hyponatremia   . Mitral regurgitation    a. mod-severe by echo 01/2017.  . Pulmonary hypertension ([place])   .  Schizophrenia ([place])   . Tobacco abuse     Past Surgical History:  Procedure Laterality Date  . PENILE PROSTHESIS IMPLANT      Social History   Socioeconomic History  . Marital status: Single    Spouse name: Not on file  . Number of children: Not on file  . Years of education: Not on file  . Highest education level: Not on file  Occupational History  . Occupation: Systems analyst  Tobacco Use  . Smoking status: Current Every Day Smoker    Packs/day: 1.50    Years: 47.00    Pack years: 70.50    Types: Cigarettes    Start date: 10  . Smokeless tobacco: Never Used  . Tobacco comment: requests patch  Vaping Use  . Vaping Use: Never used  Substance and Sexual Activity  . Alcohol use: No  . Drug use: No  . Sexual activity: Not on file  Other Topics Concern  . Not on file  Social History Narrative  . Not on file   Social Determinants of Health   Financial Resource Strain:   . Difficulty of Paying Living Expenses: Not on file  Food Insecurity:   . Worried About Charity fundraiser in the Last Year: Not on file  . Ran Out of Food in the Last Year: Not on file  Transportation Needs:   . Lack of Transportation (Medical): Not on file  . Lack of Transportation (  Non-Medical): Not on file  Physical Activity:   . Days of Exercise per Week: Not on file  . Minutes of Exercise per Session: Not on file  Stress:   . Feeling of Stress : Not on file  Social Connections:   . Frequency of Communication with Friends and Family: Not on file  . Frequency of Social Gatherings with Friends and Family: Not on file  . Attends Religious Services: Not on file  . Active Member of Clubs or Organizations: Not on file  . Attends Archivist Meetings: Not on file  . Marital Status: Not on file  Intimate Partner Violence:   . Fear of Current or Ex-Partner: Not on file  . Emotionally Abused: Not on file  . Physically Abused: Not on file  . Sexually Abused: Not on file     Allergies  Allergen Reactions  . Sulfa Antibiotics Nausea Only    Family History  Problem Relation Age of Onset  . Hypertension Mother   . Hypertension Father     Prior to Admission medications   Medication Sig Start Date End Date Taking? Authorizing Provider  albuterol (PROVENTIL HFA;VENTOLIN HFA) 108 (90 Base) MCG/ACT inhaler Inhale 1-2 puffs into the lungs every 6 (six) hours as needed for wheezing or shortness of breath.    [provider]  ASPIRIN LOW DOSE 81 MG EC tablet TAKE ONE TABLET BY MOUTH ONCE DAILY 04/01/20   [name], MD  atorvastatin (LIPITOR) 40 MG tablet TAKE ONE TABLET BY MOUTH EVERY DAY IN THE EVENING 01/08/20   [name], MD  BIDIL 20-37.5 MG tablet TAKE 1 TABLET BY MOUTH 3 (THREE) TIMES DAILY. 01/08/20   [name], MD  bisoprolol (ZEBETA) 10 MG tablet TAKE 1 TABLET (10 MG TOTAL) BY MOUTH DAILY. 11/07/19   [name], MD  Fluticasone-Umeclidin-Vilant (TRELEGY ELLIPTA) 100-62.5-25 MCG/INH AEPB Inhale 1 puff into the lungs daily. 03/21/19   Martyn Ehrich, NP  furosemide (LASIX) 20 MG tablet TAKE ONE TABLET( 20 MG TOTAL) BY MOUTH ONCE DAILY 04/01/20   [name], MD  haloperidol decanoate (HALDOL DECANOATE) 100 MG/ML injection INJECT 1 ML EVERY 4 WEEKS 08/07/18   [provider]  losartan (COZAAR) 25 MG tablet TAKE 1 TABLET (25 MG TOTAL) BY MOUTH 2 (TWO) TIMES DAILY. 08/22/19   [name], MD  potassium chloride SA (KLOR-CON) 20 MEQ tablet TAKE ONE TABLET BY MOUTH ONCE DAILY (AM) 10/29/19   [name], MD  spironolactone (ALDACTONE) 25 MG tablet TAKE ONE TABLET BY MOUTH ONCE DAILY (BEDTIME) 12/10/19   Bensimhon, Shaune Pascal, MD    Physical Exam: Vitals:   05/01/20 0900 05/01/20 0915 05/01/20 1030 05/01/20 1100  BP: 127/86  122/88 (!) 141/79  Pulse:   93 84  Resp: 17  (!) 23 19  Temp:      TempSrc:      SpO2:  97% 97% 97%     . General:  Appears calm and comfortable and is NAD . Eyes:  PERRL, EOMI, normal  lids, iris . ENT:  grossly normal hearing, lips & tongue, mmm; mostly absent dentition . Neck:  no LAD, masses or thyromegaly . Cardiovascular:  RRR, muffled heart sounds due to wheezing. No LE edema.  Marland Kitchen Respiratory:   Diffuse wheezes with moderate air movement.  Normal respiratory effort. . Abdomen:  soft, NT, ND, NABS . Back:   normal alignment, no CVAT . Skin:  no rash or induration seen on limited  exam . Musculoskeletal:  grossly normal tone BUE/BLE, good ROM, no bony abnormality . Psychiatric:  grossly normal mood and affect, speech fluent and appropriate, AOx3 . Neurologic:  CN 2-12 grossly intact, moves all extremities in coordinated fashion    Radiological Exams on Admission: Independently reviewed - see discussion in A/P where applicable  DG Chest 2 View  Result Date: 04/30/2020 CLINICAL DATA:  Shortness of breath EXAM: CHEST - 2 VIEW COMPARISON:  03/31/2019 FINDINGS: Hyperinflated lungs with emphysematous disease. No acute consolidation or effusion. Stable cardiomediastinal silhouette. No pneumothorax. Degenerative changes of the spine. IMPRESSION: No active cardiopulmonary disease. Hyperinflation with emphysematous disease. Electronically Signed   By: Donavan Foil M.D.   On: 04/30/2020 20:51    EKG: Independently reviewed.  NSR with rate 81; PACs with no evidence of acute ischemia   Labs on Admission: I have personally reviewed the available labs and imaging studies at the time of the admission.  Pertinent labs:   BUN 32/Creatinine 1.72/GFR 43 - stable HS troponin 25, 25 Normal CBC COVID/flu negative   Assessment/Plan Principal Problem:   COPD exacerbation (HCC) Active Problems:   Schizophrenia (HCC)   CKD (chronic kidney disease), stage III (HCC)   Essential hypertension   CHF (congestive heart failure) (HCC)   Tobacco abuse   Hyperlipidemia   Stage 3 severe COPD by GOLD classification (HCC)    Acute on chronic respiratory failure associated with a COPD  exacerbation with baseline stage 3 severe COPD -Patient's shortness of breath and wheezing are most likely caused by acute COPD exacerbation.  -He does not have fever or leukocytosis.  -Chest x-ray is not consistent with pneumonia -He is not currently requiring [facility] O[facility]  -will observe for now  -Nebulizers: scheduled Duoneb and prn albuterol -Solu-Medrol 80 mg IV BID -> Prednisone 40 mg PO daily; 5 days of treatment is thought to be sufficient for treatment of acute COPD exacerbations -PO Doxycycline -Continue Trelegy -Coordinated care with PT/OT/Nutrition/RT consults  Chronic diastolic CHF -Echo on 8/46/96 with normalized EF and grade 1 diastolic dysfunction, no pulmonary HTN -Continue ASA -Appears to be compensated from this standpoint at this time -Continue Bidil -Hold Lasix for now -Continue Aldactone -Mildly elevated troponin with negative delta; likely demand ischemia.  D/w cardiology overnight but there does not appear to be a need for consultation at this time. -Will monitor on telemetry overnight.  HTN -Continue Bidil, Bisoprolol -Hold Cozaar given advanced CKD  HLD -Continue Lipitor  Schizophrenia -Continue Haldol (also receives decanoate); Vistaril  Stage 3b CKD -Appears to be stable at this time -Hold Cozaar given advanced CKD  Tobacco dependence -Encourage cessation.   -This was discussed with the patient and should be reviewed on an ongoing basis.   -Patch ordered at patient request.    Note: This patient has been tested and is negative for the novel coronavirus COVID-19. He has been fully vaccinated against COVID-19.  He is open to receiving his booster while hospitalized.    DVT prophylaxis: Lovenox  Code Status:  Full - confirmed with patient Family Communication: None present Disposition Plan:  The patient is from: home  Anticipated d/c is to: home without [facility] services  Anticipated d/c date will depend on clinical response to treatment, but possibly  as early as tomorrow if he has excellent response to treatment  Patient is currently: acutely ill Consults called:  PT/OT/Nutrition/RT  Admission status: It is my clinical opinion that referral for OBSERVATION is reasonable and necessary in this patient based on the  above information provided. The aforementioned taken together are felt to place the patient at high risk for further clinical deterioration. However it is anticipated that the patient may be medically stable for discharge from the hospital within 24 to 48 hours.      Karmen Bongo MD [facility]   How to contact the [facility] Attending or Consulting provider [place] or covering provider during after hours [place], for this patient?  1. Check the care team in [facility] and look for a) attending/consulting TRH provider listed and b) the [facility] team listed 2. Log into [email or web address] and use [place]'s universal password to access. If you do not have the password, please contact the hospital operator. 3. Locate the [facility] provider you are looking for under [facility] and page to a number that you can be directly reached. 4. If you still have difficulty reaching the provider, please page the [facility] (Director on Call) for the Hospitalists listed on amion for assistance.   05/01/2020, 2:14 PM

## 2020-05-01 NOTE — ED Notes (Signed)
Lunch Tray Ordered @ 1042. 

## 2020-05-02 DIAGNOSIS — F209 Schizophrenia, unspecified: Secondary | ICD-10-CM

## 2020-05-02 DIAGNOSIS — E785 Hyperlipidemia, unspecified: Secondary | ICD-10-CM

## 2020-05-02 DIAGNOSIS — Z720 Tobacco use: Secondary | ICD-10-CM

## 2020-05-02 DIAGNOSIS — I5022 Chronic systolic (congestive) heart failure: Secondary | ICD-10-CM

## 2020-05-02 DIAGNOSIS — J441 Chronic obstructive pulmonary disease with (acute) exacerbation: Secondary | ICD-10-CM | POA: Diagnosis not present

## 2020-05-02 DIAGNOSIS — J449 Chronic obstructive pulmonary disease, unspecified: Secondary | ICD-10-CM

## 2020-05-02 DIAGNOSIS — I10 Essential (primary) hypertension: Secondary | ICD-10-CM

## 2020-05-02 DIAGNOSIS — N1832 Chronic kidney disease, stage 3b: Secondary | ICD-10-CM

## 2020-05-02 LAB — CBC
HCT: 37.7 % — ABNORMAL LOW (ref 39.0–52.0)
Hemoglobin: 12.5 g/dL — ABNORMAL LOW (ref 13.0–17.0)
MCH: 30.3 pg (ref 26.0–34.0)
MCHC: 33.2 g/dL (ref 30.0–36.0)
MCV: 91.5 fL (ref 80.0–100.0)
Platelets: 173 10*3/uL (ref 150–400)
RBC: 4.12 MIL/uL — ABNORMAL LOW (ref 4.22–5.81)
RDW: 13.5 % (ref 11.5–15.5)
WBC: 12.6 10*3/uL — ABNORMAL HIGH (ref 4.0–10.5)
nRBC: 0 % (ref 0.0–0.2)

## 2020-05-02 LAB — BASIC METABOLIC PANEL
Anion gap: 10 (ref 5–15)
BUN: 31 mg/dL — ABNORMAL HIGH (ref 8–23)
CO2: 21 mmol/L — ABNORMAL LOW (ref 22–32)
Calcium: 9.7 mg/dL (ref 8.9–10.3)
Chloride: 108 mmol/L (ref 98–111)
Creatinine, Ser: 1.68 mg/dL — ABNORMAL HIGH (ref 0.61–1.24)
GFR, Estimated: 44 mL/min — ABNORMAL LOW (ref >60)
Glucose, Bld: 149 mg/dL — ABNORMAL HIGH (ref 70–99)
Potassium: 4.7 mmol/L (ref 3.5–5.1)
Sodium: 139 mmol/L (ref 135–145)

## 2020-05-02 MED ORDER — NICOTINE 21 MG/24HR TD PT24: 21.0000 mg | MEDICATED_PATCH | Freq: Every day | TRANSDERMAL | 0 refills | Status: DC

## 2020-05-02 MED ORDER — DOXYCYCLINE HYCLATE 100 MG PO TABS
100.0000 mg | ORAL_TABLET | Freq: Two times a day (BID) | ORAL | 0 refills | Status: DC
Start: 2020-05-02 — End: 2020-12-08

## 2020-05-02 MED ORDER — PREDNISONE 20 MG PO TABS: 40.0000 mg | ORAL_TABLET | Freq: Every day | ORAL | 0 refills | Status: AC

## 2020-05-02 MED ORDER — IPRATROPIUM-ALBUTEROL 0.5-2.5 (3) MG/3ML IN SOLN: 3.0000 mL | Freq: Two times a day (BID) | RESPIRATORY_TRACT | Status: DC

## 2020-05-02 NOTE — Plan of Care (Signed)
  Problem: Education: Goal: Knowledge of General Education information will improve Description: Including pain rating scale, medication(s)/side effects and non-pharmacologic comfort measures Outcome: Progressing   Problem: Health Behavior/Discharge Planning: Goal: Ability to manage health-related needs will improve Outcome: Progressing   Problem: Clinical Measurements: Goal: Respiratory complications will improve Outcome: Progressing   Problem: Respiratory: Goal: Ability to maintain a clear airway will improve Outcome: Progressing   Problem: Respiratory: Goal: Levels of oxygenation will improve Outcome: Progressing   Problem: Respiratory: Goal: Ability to maintain adequate ventilation will improve Outcome: Progressing

## 2020-05-02 NOTE — Progress Notes (Signed)
Patient is still having labored breathing with exertion, also lung sounds-rhonchi and wheezing. Patient doesn't endorse any discomfort.

## 2020-05-02 NOTE — Progress Notes (Signed)
Initial Nutrition Assessment  DOCUMENTATION CODES:   Not applicable  INTERVENTION:   -Magic cup TID with meals, each supplement provides 290 kcal and 9 grams of protein -MVI with minerals daily  NUTRITION DIAGNOSIS:   Increased nutrient needs related to chronic illness (CHF) as evidenced by estimated needs.  GOAL:   Patient will meet greater than or equal to 90% of their needs  MONITOR:   PO intake, Supplement acceptance, Labs, Weight trends, Skin, I & O's  REASON FOR ASSESSMENT:   Consult    ASSESSMENT:   Gerald Nelson is a 69 y.o. male with medical history significant of schizophrenia; pulmonary HTN; HTN; HLD; COPD; stage 3 CKD; and chronic systolic CHF presenting with SOB.  Pt admitted with COPD exacerbation.   Reviewed I/O's: -945 ml x 24 hours  UOP: 1.4 L x 24 hours  Spoke with pt at bedside, who reports feeling well today. He is very eager for discharge. Pt shares that he had a good appetite PTA, usually consuming 2-3 meals per day. 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 Frequently consumed foods include pizza, hamburgers, and sandwiches. Pt reports he consumed all of his breakfast today. He denies chewing or swallowing difficulties despite poor dentition.   Reviewed wt hx; pt has experienced a 1% wt loss over the past 6 months, which is not significant for time frame. Per pt, he denies any weight loss. He reports his UBW is around 140-150#. He states "my heart doctor told me I've lost weight, but I don't think I have".   Medications reviewed and include prednisone and aldactone.   Labs reviewed.   NUTRITION - FOCUSED PHYSICAL EXAM:    Most Recent Value  Orbital Region No depletion  Upper Arm Region Mild depletion  Thoracic and Lumbar Region No depletion  Buccal Region No depletion  Temple Region No depletion  Clavicle Bone Region Mild depletion  Clavicle and Acromion Bone Region Mild depletion  Scapular  Bone Region Mild depletion  Dorsal Hand Moderate depletion  Patellar Region Mild depletion  Anterior Thigh Region Mild depletion  Posterior Calf Region Mild depletion  Edema (RD Assessment) None  Hair Reviewed  Eyes Reviewed  Mouth Reviewed  Skin Reviewed  Nails Reviewed       Diet Order:   Diet Order            Diet Heart Room service appropriate? Yes; Fluid consistency: Thin  Diet effective now                 EDUCATION NEEDS:   Education needs have been addressed  Skin:  Skin Assessment: Reviewed RN Assessment  Last BM:  05/01/20  Height:   Ht Readings from Last 1 Encounters:  04/01/20 5\' 10"  (1.778 m)    Weight:   Wt Readings from Last 1 Encounters:  05/02/20 62.9 kg    Ideal Body Weight:  75.5 kg  BMI:  Body mass index is 19.9 kg/m.  Estimated Nutritional Needs:   Kcal:  1850-2050  Protein:  95-110 grams  Fluid:  > 1.8 L    Loistine Chance, RD, LDN, [place] Registered Dietitian II Certified Diabetes Care and Education Specialist Please refer to [facility] for RD and/or RD on-call/weekend/after hours pager

## 2020-05-02 NOTE — TOC Transition Note (Signed)
Transition of Care [facility]) - CM/SW Discharge Note   Patient Details  Name: Gerald Nelson MRN: [id number] Date of Birth: 01/22/1951  [facility] CM/SW Contact:  Zenon Mayo, RN Phone Number: 05/02/2020, 11:58 AM   Clinical Narrative:    NCM spoke with patient, he is for dc today, he refused HHPT.  He states he has transportation at [employer].     Final next level of care: Home/Self Care Barriers to Discharge: No Barriers Identified   Patient Goals and CMS Choice Patient states their goals for this hospitalization and ongoing recovery are:: get better   Choice offered to / list presented to : NA  Discharge Placement                       Discharge Plan and Services                  DME Agency: NA                  Social Determinants of Health (SDOH) Interventions     Readmission Risk Interventions No flowsheet data found.

## 2020-05-02 NOTE — Progress Notes (Signed)
Flu shot administered this morning.  Marcille Blanco RN

## 2020-05-02 NOTE — Discharge Summary (Signed)
Physician Discharge Summary  MCCADE SULLENBERGER PVX:[id number] DOB: Oct 23, 1950 DOA: 04/30/2020  PCP: [name], MD  Admit date: 04/30/2020 Discharge date: 05/02/2020  Time spent: 45 minutes  Recommendations for Outpatient Follow-up:  Patient will be discharged to home.  Patient will need to follow up with primary care provider within one week of discharge.  Patient should continue medications as prescribed.  Patient should follow a heart healthy diet.   Discharge Diagnoses:  Acute on chronic respiratory failure secondary to COPD exacerbation  Chronic diastolic heart failure Elevated troponin Essential hypertension Hyperlipidemia Schizophrenia Chronic kidney disease, stage IIIb Tobacco dependence  Discharge Condition: Stable  Diet recommendation: Heart healthy  Filed Weights   05/02/20 0010  Weight: 62.9 kg    History of present illness:  On 05/01/2020 by Dr. Karmen Bongo [name] is a 69 y.o. male with medical history significant of schizophrenia; pulmonary HTN; HTN; HLD; COPD; stage 3 CKD; and chronic systolic CHF presenting with SOB.  He reports that he was at work ([employer]) and his supervisor came to check on him - he wasn't sure why.  A student came up and did the same thing.  He was having heavy breathing and not functioning well.  No Cough, + wheezing.  No fever.  No recent URI.  He feels fine now.  Hospital Course:  Acute on chronic respiratory failure (without hypoxia) secondary to COPD exacerbation  -Patient with stage III COPD at baseline -He does not use oxygen at baseline and currently does not need oxygen -Chest x-ray reviewed and showed no source of infection.  Does show hyperinflation with emphysematous disease -Patient currently on room air and maintaining oxygen saturations in the high nineties to 100.  Ambulatory oxygen saturations also remained at 100% on room air -Patient was placed on nebulizer treatments along with albuterol,  doxycycline, Solu-Medrol -Will be discharged with prednisone taper -Continue Trelegy  Chronic diastolic heart failure -Currently euvolemic and compensated -Echocardiogram 04/01/2020 showed an EF of 48%, grade 1 diastolic dysfunction -Continue BiDil, Aldactone -Lasix was initially held on admission but may resume on discharge  Elevated troponin -Mildly elevated 25, however 0 delta -Of note it seems that patient has chronically mildly elevated troponin -Suspect demand -He was contacted by EDP, no need for cardiology consult at this time given the patient has had no change in his EKG or troponin and no complaints of chest pain  Essential hypertension -Continue BiDil, bisoprolol -Cozaar was held due to advanced CKD- may restart on discharge and follow up with PCP  -BP currently stable  Hyperlipidemia -Continue statin  Schizophrenia -Continue home medications on discharge  Chronic kidney disease, stage IIIb -Creatinine appears to be stable  Tobacco dependence -Discussed smoking cessation -Continue nicotine patch  Leukocytosis -Suspect secondary to steroids -No source of infection  Procedures: none  Consultations: none  Discharge Exam: Vitals:   05/02/20 0948 05/02/20 0948  BP:    Pulse: 98 94  Resp:    Temp:    SpO2: 100% 100%     General: Well developed, chronically ill-appearing, NAD  HEENT: NCAT, mucous membranes moist.  Cardiovascular: S1 S2 auscultated, RRR  Respiratory: Few inspiratory wheezes, otherwise clear  Abdomen: Soft, nontender, nondistended, + bowel sounds  Extremities: warm dry without cyanosis clubbing or edema  Neuro: AAOx3, nonfocal  Psych: appropriate mood and affect, pleasant  Discharge Instructions Discharge Instructions    Discharge instructions   Complete by: As directed    Patient will be discharged to home.  Patient  will need to follow up with primary care provider within one week of discharge.  Patient should continue  medications as prescribed.  Patient should follow a heart healthy diet.   Increase activity slowly   Complete by: As directed      Allergies as of 05/02/2020      Reactions   Sulfa Antibiotics Nausea Only      Medication List    TAKE these medications   albuterol 108 (90 Base) MCG/ACT inhaler Commonly known as: VENTOLIN HFA Inhale 1-2 puffs into the lungs every 6 (six) hours as needed for wheezing or shortness of breath.   Aspirin Low Dose 81 MG EC tablet Generic drug: aspirin TAKE ONE TABLET BY MOUTH ONCE DAILY What changed: how much to take   atorvastatin 40 MG tablet Commonly known as: LIPITOR TAKE ONE TABLET BY MOUTH EVERY DAY IN THE EVENING   BiDil 20-37.5 MG tablet Generic drug: isosorbide-hydrALAZINE TAKE 1 TABLET BY MOUTH 3 (THREE) TIMES DAILY.   bisoprolol 10 MG tablet Commonly known as: ZEBETA TAKE 1 TABLET (10 MG TOTAL) BY MOUTH DAILY.   doxycycline 100 MG tablet Commonly known as: VIBRA-TABS Take 1 tablet (100 mg total) by mouth every 12 (twelve) hours.   furosemide 20 MG tablet Commonly known as: LASIX TAKE ONE TABLET( 20 MG TOTAL) BY MOUTH ONCE DAILY What changed: See the new instructions.   haloperidol 5 MG tablet Commonly known as: HALDOL Take 5 mg by mouth 2 (two) times daily.   haloperidol decanoate 100 MG/ML injection Commonly known as: HALDOL DECANOATE Inject 100 mg into the muscle every 28 (twenty-eight) days.   hydrOXYzine 25 MG capsule Commonly known as: VISTARIL Take 25 mg by mouth 2 (two) times daily.   losartan 25 MG tablet Commonly known as: COZAAR TAKE 1 TABLET (25 MG TOTAL) BY MOUTH 2 (TWO) TIMES DAILY.   nicotine 21 mg/24hr patch Commonly known as: NICODERM CQ - dosed in mg/24 hours Place 1 patch (21 mg total) onto the skin daily. Start taking on: May 03, 2020   potassium chloride SA 20 MEQ tablet Commonly known as: KLOR-CON TAKE ONE TABLET BY MOUTH ONCE DAILY (AM) What changed: See the new instructions.     predniSONE 20 MG tablet Commonly known as: DELTASONE Take 2 tablets (40 mg total) by mouth daily with breakfast for 4 days. Start taking on: May 03, 2020   spironolactone 25 MG tablet Commonly known as: ALDACTONE TAKE ONE TABLET BY MOUTH ONCE DAILY (BEDTIME) What changed: See the new instructions.   Trelegy Ellipta 100-62.5-25 MCG/INH Aepb Generic drug: Fluticasone-Umeclidin-Vilant Inhale 1 puff into the lungs daily.      Allergies  Allergen Reactions  . Sulfa Antibiotics Nausea Only    Follow-up Information    [name], MD. Schedule an appointment as soon as possible for a visit in 1 week(s).   Specialty: Internal Medicine Why: Hospital follow-up Contact information: [address], [address] [place] [address] [phone]        [name], MD .   Specialty: Cardiology Contact information: [place] [address] [phone]                The results of significant diagnostics from this hospitalization (including imaging, microbiology, ancillary and laboratory) are listed below for reference.    Significant Diagnostic Studies: DG Chest 2 View  Result Date: 04/30/2020 CLINICAL DATA:  Shortness of breath EXAM: CHEST - 2 VIEW COMPARISON:  03/31/2019 FINDINGS: Hyperinflated lungs with  emphysematous disease. No acute consolidation or effusion. Stable cardiomediastinal silhouette. No pneumothorax. Degenerative changes of the spine. IMPRESSION: No active cardiopulmonary disease. Hyperinflation with emphysematous disease. Electronically Signed   By: Donavan Foil M.D.   On: 04/30/2020 20:51    Microbiology: Recent Results (from the past 240 hour(s))  Respiratory Panel by RT PCR (Flu A&B, Covid) - Nasopharyngeal Swab     Status: None   Collection Time: 04/30/20 11:57 PM   Specimen: Nasopharyngeal Swab  Result Value Ref Range Status   SARS Coronavirus 2 by RT PCR NEGATIVE NEGATIVE Final    Comment: (NOTE) SARS-CoV-2  target nucleic acids are NOT DETECTED.  The SARS-CoV-2 RNA is generally detectable in upper respiratoy specimens during the acute phase of infection. The lowest concentration of SARS-CoV-2 viral copies this assay can detect is 131 copies/mL. A negative result does not preclude SARS-Cov-2 infection and should not be used as the sole basis for treatment or other patient management decisions. A negative result may occur with  improper specimen collection/handling, submission of specimen other than nasopharyngeal swab, presence of viral mutation(s) within the areas targeted by this assay, and inadequate number of viral copies (<131 copies/mL). A negative result must be combined with clinical observations, patient history, and epidemiological information. The expected result is Negative.  Fact Sheet for Patients:  PinkCheek.be  Fact Sheet for Healthcare Providers:  GravelBags.it  This test is no t yet approved or cleared by the [place] FDA and  has been authorized for detection and/or diagnosis of SARS-CoV-2 by FDA under an Emergency Use Authorization (EUA). This EUA will remain  in effect (meaning this test can be used) for the duration of the COVID-19 declaration under Section 564(b)(1) of the Act, 21 U.S.C. section 360bbb-3(b)(1), unless the authorization is terminated or revoked sooner.     Influenza A by PCR NEGATIVE NEGATIVE Final   Influenza B by PCR NEGATIVE NEGATIVE Final    Comment: (NOTE) The Xpert Xpress SARS-CoV-2/FLU/RSV assay is intended as an aid in  the diagnosis of influenza from Nasopharyngeal swab specimens and  should not be used as a sole basis for treatment. Nasal washings and  aspirates are unacceptable for Xpert Xpress SARS-CoV-2/FLU/RSV  testing.  Fact Sheet for Patients: PinkCheek.be  Fact Sheet for Healthcare  Providers: GravelBags.it  This test is not yet approved or cleared by the [place] FDA and  has been authorized for detection and/or diagnosis of SARS-CoV-2 by  FDA under an Emergency Use Authorization (EUA). This EUA will remain  in effect (meaning this test can be used) for the duration of the  Covid-19 declaration under Section 564(b)(1) of the Act, 21  U.S.C. section 360bbb-3(b)(1), unless the authorization is  terminated or revoked. Performed at [facility], Day [address], [place], [place] [address]      Labs: Basic Metabolic Panel: Recent Labs  Lab 04/30/20 2047 05/02/20 0346  NA 139 139  K 4.1 4.7  CL 105 108  CO2 24 21*  GLUCOSE 89 149*  BUN 32* 31*  CREATININE 1.72* 1.68*  CALCIUM 9.5 9.7   Liver Function Tests: No results for input(s): AST, ALT, ALKPHOS, BILITOT, PROT, ALBUMIN in the last 168 hours. No results for input(s): LIPASE, AMYLASE in the last 168 hours. No results for input(s): AMMONIA in the last 168 hours. CBC: Recent Labs  Lab 04/30/20 2047 05/02/20 0346  WBC 7.7 12.6*  HGB 13.1 12.5*  HCT 40.5 37.7*  MCV 93.3 91.5  PLT 173 173  Cardiac Enzymes: No results for input(s): CKTOTAL, CKMB, CKMBINDEX, TROPONINI in the last 168 hours. BNP: BNP (last 3 results) No results for input(s): BNP in the last 8760 hours.  ProBNP (last 3 results) No results for input(s): PROBNP in the last 8760 hours.  CBG: No results for input(s): GLUCAP in the last 168 hours.     Signed:  Cristal Ford  [facility] 05/02/2020, 10:41 AM

## 2020-05-02 NOTE — Discharge Instructions (Signed)

## 2020-05-02 NOTE — Evaluation (Signed)
Occupational Therapy Evaluation Patient Details Name: Gerald Nelson MRN: [id number] DOB: [birth year] Today's Date: 05/02/2020    History of Present Illness Pt is a 69 y/o male admitted secondary to worsening SOB. Thought to be secondary to COPD exacerbation. PMH includes CHF, CKD, COPD, HTN, Schizophrenia, and tobacco use.    Clinical Impression   PTA, pt was living in a home (renting a room) alone. He reports he was independent with ADL/IADL and functional mobility. Pt reports his pharmacist fills his medication pillbox for him. Pt currently reports he is functioning at baseline level. Pt requires supervision to minguard with functional mobility without AD. Pt demonstrates cognitive limitations (short term memory, problem solving, safety awareness), but reports this is baseline. Educated pt on energy conservation strategies during ADL/IADL completion. Pt with anticipated d/c this date. Should he remain in acute care, will continue to follow.     Follow Up Recommendations  No OT follow up;Supervision - Intermittent (with medication and mobility)    Equipment Recommendations  None recommended by OT    Recommendations for Other Services       Precautions / Restrictions Precautions Precautions: Fall Restrictions Weight Bearing Restrictions: No      Mobility Bed Mobility Overal bed mobility: Modified Independent                  Transfers Overall transfer level: Needs assistance Equipment used: None Transfers: Sit to/from Stand Sit to Stand: Min guard         General transfer comment: min guard for safety.     Balance Overall balance assessment: Mild deficits observed, not formally tested                                         ADL either performed or assessed with clinical judgement   ADL Overall ADL's : At baseline                                       General ADL Comments: pt completed toileting and grooming at  supervision-minguard level;pt reports he is functioning at baseline;noted instability during ambulation, no overt loss of balance     Vision Baseline Vision/History: No visual deficits Patient Visual Report: No change from baseline Additional Comments: pt reading without difficulty     Perception     Praxis      Pertinent Vitals/Pain Pain Assessment: No/denies pain     Hand Dominance Right   Extremity/Trunk Assessment Upper Extremity Assessment Upper Extremity Assessment: Generalized weakness   Lower Extremity Assessment Lower Extremity Assessment: Generalized weakness   Cervical / Trunk Assessment Cervical / Trunk Assessment: Normal   Communication Communication Communication: No difficulties   Cognition Arousal/Alertness: Awake/alert Behavior During Therapy: WFL for tasks assessed/performed Overall Cognitive Status: No family/caregiver present to determine baseline cognitive functioning                                 General Comments: pt with decreased safety awareness, pt completed medicog (screening tool to identify cognitive functioning skills) scoring 3/10; A score 8/10 may indicate adequate cognition skills;pts score indicates he may need assistance and supervision with IADL such as medication management, managing finances, etc.     General Comments  VSS    Exercises  Shoulder Instructions      Home Living Family/patient expects to be discharged to:: Unsure Living Arrangements: Other (Comment) Available Help at Discharge: Friend(s);Available PRN/intermittently Type of Home: House Home Access: Stairs to enter [employer] of Steps: 3-4 Entrance Stairs-Rails: None Home Layout: One level     Bathroom Shower/Tub: Teacher, early years/pre: Standard     Home Equipment: None   Additional Comments: Rents a room       Prior Functioning/Environment Level of Independence: Independent        Comments: pt reports  pharmacist fills pillbox for him;no falls indicated        OT Problem List: Decreased activity tolerance;Decreased safety awareness;Decreased cognition      OT Treatment/Interventions:      OT Goals(Current goals can be found in the care plan section) Acute Rehab OT Goals Patient Stated Goal: to go home OT Goal Formulation: With patient Time For Goal Achievement: 05/16/20 Potential to Achieve Goals: Good  OT Frequency:     Barriers to D/C:            Co-evaluation              AM-PAC OT "6 Clicks" Daily Activity     Outcome Measure Help from another person eating meals?: None Help from another person taking care of personal grooming?: A Little Help from another person toileting, which includes using toliet, bedpan, or urinal?: A Little Help from another person bathing (including washing, rinsing, drying)?: A Little Help from another person to put on and taking off regular upper body clothing?: A Little Help from another person to put on and taking off regular lower body clothing?: A Little 6 Click Score: 19   End of Session Nurse Communication: Mobility status  Activity Tolerance: Patient tolerated treatment well Patient left: in bed;with call bell/phone within reach  OT Visit Diagnosis: Other abnormalities of gait and mobility (R26.89);Muscle weakness (generalized) (M62.81)                Time: 4782-9562 OT Time Calculation (min): 18 min Charges:  OT General Charges $OT Visit: 1 Visit OT Evaluation $OT Eval Moderate Complexity: Rogers OTR/L Acute Rehabilitation Services Office: [place] 05/02/2020, 11:10 AM

## 2020-05-02 NOTE — Care Management Obs Status (Signed)
[facility] NOTIFICATION   Patient Details  Name: AHLIJAH RAIA MRN: [id number] Date of Birth: 11-18-1950   Medicare Observation Status Notification Given:  Yes    Zenon Mayo, RN 05/02/2020, 11:56 AM

## 2020-05-06 ENCOUNTER — Encounter (HOSPITAL_COMMUNITY)
Admission: RE | Admit: 2020-05-06 | Discharge: 2020-05-06 | Disposition: A | Discharge status: Home / Self Care | Payer: Medicare Other | Source: Ambulatory Visit | Attending: Cardiology | Admitting: Cardiology

## 2020-05-06 ENCOUNTER — Other Ambulatory Visit: Payer: Self-pay

## 2020-05-06 DIAGNOSIS — I5022 Chronic systolic (congestive) heart failure: Secondary | ICD-10-CM | POA: Insufficient documentation

## 2020-05-06 MED ORDER — TECHNETIUM TC 99M PYROPHOSPHATE
20.2000 | Freq: Once | INTRAVENOUS | Status: AC | PRN
  Administered 2020-05-06: 20.2 via INTRAVENOUS
  Filled 2020-05-06: qty 21

## 2020-05-07 ENCOUNTER — Encounter (HOSPITAL_COMMUNITY): Payer: Medicare Other

## 2020-05-27 ENCOUNTER — Other Ambulatory Visit: Payer: Self-pay | Admitting: Internal Medicine

## 2020-07-22 ENCOUNTER — Other Ambulatory Visit: Payer: Self-pay | Admitting: Internal Medicine

## 2020-07-22 ENCOUNTER — Other Ambulatory Visit: Payer: Self-pay | Admitting: Cardiology

## 2020-08-12 DIAGNOSIS — R059 Cough, unspecified: Secondary | ICD-10-CM | POA: Diagnosis not present

## 2020-08-12 DIAGNOSIS — J449 Chronic obstructive pulmonary disease, unspecified: Secondary | ICD-10-CM | POA: Diagnosis not present

## 2020-08-21 ENCOUNTER — Other Ambulatory Visit: Payer: Self-pay | Admitting: Cardiology

## 2020-08-27 DIAGNOSIS — F17210 Nicotine dependence, cigarettes, uncomplicated: Secondary | ICD-10-CM | POA: Diagnosis not present

## 2020-08-27 DIAGNOSIS — J441 Chronic obstructive pulmonary disease with (acute) exacerbation: Secondary | ICD-10-CM | POA: Diagnosis not present

## 2020-08-29 DIAGNOSIS — I509 Heart failure, unspecified: Secondary | ICD-10-CM | POA: Diagnosis not present

## 2020-08-29 DIAGNOSIS — E785 Hyperlipidemia, unspecified: Secondary | ICD-10-CM | POA: Diagnosis not present

## 2020-08-29 DIAGNOSIS — J449 Chronic obstructive pulmonary disease, unspecified: Secondary | ICD-10-CM | POA: Diagnosis not present

## 2020-08-29 DIAGNOSIS — N183 Chronic kidney disease, stage 3 unspecified: Secondary | ICD-10-CM | POA: Diagnosis not present

## 2020-08-29 DIAGNOSIS — I5022 Chronic systolic (congestive) heart failure: Secondary | ICD-10-CM | POA: Diagnosis not present

## 2020-08-29 DIAGNOSIS — K219 Gastro-esophageal reflux disease without esophagitis: Secondary | ICD-10-CM | POA: Diagnosis not present

## 2020-08-29 DIAGNOSIS — I10 Essential (primary) hypertension: Secondary | ICD-10-CM | POA: Diagnosis not present

## 2020-08-29 DIAGNOSIS — J441 Chronic obstructive pulmonary disease with (acute) exacerbation: Secondary | ICD-10-CM | POA: Diagnosis not present

## 2020-09-15 ENCOUNTER — Encounter (HOSPITAL_COMMUNITY): Payer: Medicare Other

## 2020-09-22 ENCOUNTER — Other Ambulatory Visit: Payer: Self-pay | Admitting: Internal Medicine

## 2020-09-26 DIAGNOSIS — I509 Heart failure, unspecified: Secondary | ICD-10-CM | POA: Diagnosis not present

## 2020-09-26 DIAGNOSIS — J441 Chronic obstructive pulmonary disease with (acute) exacerbation: Secondary | ICD-10-CM | POA: Diagnosis not present

## 2020-09-26 DIAGNOSIS — J449 Chronic obstructive pulmonary disease, unspecified: Secondary | ICD-10-CM | POA: Diagnosis not present

## 2020-09-26 DIAGNOSIS — I5022 Chronic systolic (congestive) heart failure: Secondary | ICD-10-CM | POA: Diagnosis not present

## 2020-09-26 DIAGNOSIS — K219 Gastro-esophageal reflux disease without esophagitis: Secondary | ICD-10-CM | POA: Diagnosis not present

## 2020-09-26 DIAGNOSIS — N183 Chronic kidney disease, stage 3 unspecified: Secondary | ICD-10-CM | POA: Diagnosis not present

## 2020-09-26 DIAGNOSIS — E785 Hyperlipidemia, unspecified: Secondary | ICD-10-CM | POA: Diagnosis not present

## 2020-09-26 DIAGNOSIS — I10 Essential (primary) hypertension: Secondary | ICD-10-CM | POA: Diagnosis not present

## 2020-09-30 DIAGNOSIS — J449 Chronic obstructive pulmonary disease, unspecified: Secondary | ICD-10-CM | POA: Diagnosis not present

## 2020-09-30 DIAGNOSIS — I700 Atherosclerosis of aorta: Secondary | ICD-10-CM | POA: Diagnosis not present

## 2020-09-30 DIAGNOSIS — F17210 Nicotine dependence, cigarettes, uncomplicated: Secondary | ICD-10-CM | POA: Diagnosis not present

## 2020-09-30 DIAGNOSIS — N183 Chronic kidney disease, stage 3 unspecified: Secondary | ICD-10-CM | POA: Diagnosis not present

## 2020-09-30 DIAGNOSIS — I679 Cerebrovascular disease, unspecified: Secondary | ICD-10-CM | POA: Diagnosis not present

## 2020-09-30 DIAGNOSIS — I5022 Chronic systolic (congestive) heart failure: Secondary | ICD-10-CM | POA: Diagnosis not present

## 2020-09-30 DIAGNOSIS — I10 Essential (primary) hypertension: Secondary | ICD-10-CM | POA: Diagnosis not present

## 2020-10-05 NOTE — Progress Notes (Incomplete)
Date:  10/05/2020   ID:  Gerald Nelson, DOB 1951/05/30, MRN [id number]   Provider location: [facility] Type of Visit: Established patient   PCP:  [name], MD  Cardiologist:  Dr. Aundra Dubin  Chief Complaint: Shortness of breath   History of Present Illness: Gerald Nelson is a 70 y.o. male who has a past medical history of chronic systolic CHF (EF 0000000 in July 2018), severe MR/TR, pulmonary HTN, CVA, CKD, schizophrenia, HTN and tobacco abuse.   [name] was admitted to [facility] 7/2-01/06/17 for newly diagnosed acute systolic CHF. Echo showed LVEF 15%, severe global hypokinesis, moderate LVH, coarsetrabeculation of the LV apex with numerous false tendinae of the left ventricle, very stagnant blood flow at the LV apex withsmoke but no obvious LV thrombus - Definity contrast was given,again, noting stagnant apical blood flow - this could suggestrecent thrombus and certainly high risk for apical thrombusformation. Other findings include aortic sclerosis with mild AI,moderate to severe MR, moderate LAE, mild RAE, moderate to severeTR, moderate to severe pulmonary hypertension (RVSP 73 mmHg), dilated IVC, trivial posterior pericardial effusion.   He was admitted again in 10/18. He diuresed 25 pounds on IV lasix. He refused cath. His clonidine and diltazem were stopped during this admission. Referred to paramedicine. Discharge weight 133 pounds.   Echo 08/11/17 LVEF 15-20%, no MR noted.   Echo in 7/20 showed improvement in EF to 50-55%, moderate LVH, normal RV size and systolic function.  Echo was done today and reviewed, EF 55% with moderate LVH, RV normal, IVC normal.   Today he returns for HF follow up.Overall feeling fine. Denies SOB/PND/Orthopnea. Appetite ok. No fever or chills. Weight at home  pounds. Taking all medications  Labs (6/19): K 3.8, creatinine 2.2 Labs (11/19): K 4.1, creatinine 1.95, LDL 54, HDl 54 Labs (12/19): K 4.6, creatinine 1.89 LabS  (2/20): K 4.5, creatinine 1.68  Labs (4/20): K 4.5, creatinine 1.58 Labs (9/20): K 5.1, creatinine 2.02 Labs (10/20): LDL 67, K 4.8, creatinine 1.92 Labs (3/21): K 4.8, creatinine 1.97  PMH: 1. Chronic systolic CHF:  Cardiomyopathy of uncertain etiology, refused cath. He also refused ICD.  - Echo (7/18): Moderate LVH, severe FBSH, trabeculation at apex not meeting criteria for noncompaction, EF 15%, moderate to severe MR, low normal RV systolic function, moderate-severe TR.  - Echo (2/19): Moderate LVH, EF 15-20%, No mitral regurgitation, normal RV size and systolic function, mild TR.  - Echo (7/20): EF 50-55%, moderate LVH, normal RV size and systolic function.  - Echo (9/21): EF 55%, moderate LVH, RV normal, IVC normal.  2. H/o CVA 3. Schizophrenia 4. CKD stage 3 5. Mitral regurgitation: Suspect functional. Moderate-severe MR on 7/18 echo, minimal MR on 2/19 echo.  6. COPD: He is an active smoker.  - PFTs (12/18) suggestive of severe COPD.  7. H/o HTN 8. ? apical mural thrombus: Not seen on most recent echo in 2/19.  9. CAD: 1/19 CT chest showed coronary calcification.   Current Outpatient Medications  Medication Sig Dispense Refill  . albuterol (PROVENTIL HFA;VENTOLIN HFA) 108 (90 Base) MCG/ACT inhaler Inhale 1-2 puffs into the lungs every 6 (six) hours as needed for wheezing or shortness of breath.    . ASPIRIN LOW DOSE 81 MG EC tablet TAKE ONE TABLET BY MOUTH ONCE DAILY (Patient taking differently: Take 81 mg by mouth daily. ) 90 tablet 3  . atorvastatin (LIPITOR) 40 MG tablet TAKE ONE TABLET BY MOUTH EVERY DAY IN THE  EVENING 30 tablet 6  . BIDIL 20-37.5 MG tablet TAKE 1 TABLET BY MOUTH 3 (THREE) TIMES DAILY. 180 tablet 6  . bisoprolol (ZEBETA) 10 MG tablet TAKE 1 TABLET (10 MG TOTAL) BY MOUTH DAILY. 90 tablet 3  . doxycycline (VIBRA-TABS) 100 MG tablet Take 1 tablet (100 mg total) by mouth every 12 (twelve) hours. 4 tablet 0  . Fluticasone-Umeclidin-Vilant (TRELEGY ELLIPTA)  100-62.5-25 MCG/INH AEPB Inhale 1 puff into the lungs daily. 2 each 0  . furosemide (LASIX) 20 MG tablet TAKE ONE TABLET( 20 MG TOTAL) BY MOUTH ONCE DAILY (Patient taking differently: Take 20 mg by mouth daily. ) 30 tablet 6  . haloperidol (HALDOL) 5 MG tablet Take 5 mg by mouth 2 (two) times daily.    . haloperidol decanoate (HALDOL DECANOATE) 100 MG/ML injection Inject 100 mg into the muscle every 28 (twenty-eight) days.     . hydrOXYzine (VISTARIL) 25 MG capsule Take 25 mg by mouth 2 (two) times daily.    Marland Kitchen losartan (COZAAR) 25 MG tablet TAKE ONE TABLET BY MOUTH TWICE DAILY (AM+BEDTIME) 180 tablet 3  . nicotine (NICODERM CQ - DOSED IN MG/24 HOURS) 21 mg/24hr patch Place 1 patch (21 mg total) onto the skin daily. 28 patch 0  . potassium chloride SA (KLOR-CON) 20 MEQ tablet TAKE ONE TABLET BY MOUTH ONCE DAILY (AM) 90 tablet 0  . spironolactone (ALDACTONE) 25 MG tablet TAKE 1 TABLET (25 MG TOTAL) BY MOUTH AT BEDTIME. 30 tablet 1   No current facility-administered medications for this visit.    Allergies:   Sulfa antibiotics   Social History:  The patient  reports that he has been smoking cigarettes. He started smoking about 48 years ago. He has a 70.50 pack-year smoking history. He has never used smokeless tobacco. He reports that he does not drink alcohol and does not use drugs.   Family History:  The patient's family history includes Hypertension in his father and mother.   ROS:  Please see the history of present illness.   All other systems are personally reviewed and negative.   Exam:   There were no vitals taken for this visit. General:  Well appearing. No resp difficulty HEENT: normal Neck: supple. no JVD. Carotids 2+ bilat; no bruits. No lymphadenopathy or thryomegaly appreciated. Cor: PMI nondisplaced. Regular rate & rhythm. No rubs, gallops or murmurs. Lungs: clear Abdomen: soft, nontender, nondistended. No hepatosplenomegaly. No bruits or masses. Good bowel sounds. Extremities:  no cyanosis, clubbing, rash, edema Neuro: alert & orientedx3, cranial nerves grossly intact. moves all 4 extremities w/o difficulty. Affect pleasant   Recent Labs: 05/02/2020: BUN 31; Creatinine, Ser 1.68; Hemoglobin 12.5; Platelets 173; Potassium 4.7; Sodium 139  Personally reviewed   Wt Readings from Last 3 Encounters:  05/02/20 62.9 kg (138 lb 11.2 oz)  04/01/20 59.1 kg (130 lb 6.4 oz)  12/04/19 62.6 kg (138 lb)      ASSESSMENT AND PLAN:  1. Chronic systolic => diastolic CHF: Echo 99991111 EF 15% with moderate-severe MR. Echo 08/11/17 LVEF 15-20%, no MR noted. Cardiomyopathy of uncertain etiology, he has refused cath in the past.  Echo in 7/20 showed improvement in EF to 50-55%.  Echo today showed EF stable 55%, moderate LVH, normal RV.  NYHA class II symptoms.  Creatinine 1.97 at last check.  BP stable.  He looks euvolemic. With LVH and diastolic CHF, cardiac amyloidosis is a consideration.  -05/2020 PYP was not suggestive of amyloidosis.   - He has been reluctant to have heart  cath, think we can hold off for now with improvement in EF.  - Continue Lasix 20 mg daily - Continue bisoprolol 10 mg daily.   - Continue losartan 25 mg bid.  - Continue spironolactone 25 mg daily.  - Continue Bidil 1 tab tid.  - He is out of ICD range now, refused ICD in past.   2. Questionable apical thrombus: Not seen on 7/20 or 9/21 echoes. He is not anticoagulated.  3. Mitral regurgitation: Moderate to severe on 7/18 echo but trivial on 7/20 and 9/21 echoes.  Suspect functional MR, improved with improved LV function.  4. COPD:  Severe COPD by prior PFTs.  5. CKD stage III:  6. Schizophrenia: Per PCP.  7. CAD: Noted on CT chest.  ECG with old anteroseptal MI, but echo shows EF back in normal range.  - Continue ASA 81.  - Continue atorvastatin, check lipids.   - As above, recommended cath in past for diagnosis of CAD but he refused.  Think reasonable to hold off now with improved EF and no chest pain.     Follow up with Dr Aundra Dubin in 6 months   Signed, Darrick Grinder, NP  10/05/2020  [place] [address] [facility] [place] [address] [phone] (office) [phone] (fax)

## 2020-10-06 ENCOUNTER — Encounter (HOSPITAL_COMMUNITY): Payer: Medicare Other

## 2020-10-08 ENCOUNTER — Ambulatory Visit
Admission: RE | Admit: 2020-10-08 | Discharge: 2020-10-08 | Disposition: A | Discharge status: Home / Self Care | Payer: Medicare Other | Source: Ambulatory Visit | Attending: Internal Medicine | Admitting: Internal Medicine

## 2020-10-08 ENCOUNTER — Other Ambulatory Visit: Payer: Self-pay | Admitting: Internal Medicine

## 2020-10-08 DIAGNOSIS — I5022 Chronic systolic (congestive) heart failure: Secondary | ICD-10-CM | POA: Diagnosis not present

## 2020-10-08 DIAGNOSIS — R0602 Shortness of breath: Secondary | ICD-10-CM | POA: Diagnosis not present

## 2020-10-08 DIAGNOSIS — J449 Chronic obstructive pulmonary disease, unspecified: Secondary | ICD-10-CM | POA: Diagnosis not present

## 2020-10-15 ENCOUNTER — Ambulatory Visit (HOSPITAL_COMMUNITY)
Admission: EM | Admit: 2020-10-15 | Discharge: 2020-10-15 | Disposition: A | Discharge status: Home / Self Care | Payer: Medicare Other | Attending: Psychiatry | Admitting: Psychiatry

## 2020-10-15 ENCOUNTER — Other Ambulatory Visit: Payer: Self-pay

## 2020-10-15 DIAGNOSIS — F200 Paranoid schizophrenia: Secondary | ICD-10-CM | POA: Insufficient documentation

## 2020-10-15 DIAGNOSIS — F209 Schizophrenia, unspecified: Secondary | ICD-10-CM | POA: Diagnosis not present

## 2020-10-15 DIAGNOSIS — F17210 Nicotine dependence, cigarettes, uncomplicated: Secondary | ICD-10-CM | POA: Diagnosis not present

## 2020-10-15 NOTE — Progress Notes (Signed)
TTS Triage: Pt to [facility] voluntarily with his landlord due to [employer], agitation and difficulty sleeping. Pt was referred by Dr. Toy Care to come for an evaluation. Pt reports voices are random and non-command. Pt with hx of schizophrenia since 1994 and on Haldol injection for several years. Landlord and neighbors witnessing pt talk to self and cursing saying "I'm going to kill you bitch" pt unaware of this account. Pt denies SI,HI, VH, SIB and substance use. Landlord reports pt smokes 5 packs of cigarettes a day and he has shortness of breath and severe cough. Landlord available for collateral if needed and plans on picking patient up when he is ready to leave. Barb Merino [phone]  Pt is routine.

## 2020-10-15 NOTE — ED Provider Notes (Signed)
[facility] Medical Screening Exam  Patient Name: Gerald Nelson MRN: [id number] Date of Evaluation: 10/15/20 Chief Complaint:   Diagnosis:  Final diagnoses:  Schizophrenia, unspecified type ([place])    History of Present illness: Gerald Nelson is a 70 y.o. male with a history of schizophrenia who presented to the [facility] voluntarily with landlord due to [facility], agitation and difficulty sleeping.   Pt interviewed alone. Pt states that he is overall doing "fine" and came to the [facility] with his landlord because "my landlady says she isn't doing a good enough job",  referring to psychiatrist Dr. Chucky May who has been managing his medications for years. Pt states that he has had a dx of schizophrenia since 1994 and currently takes haldol. He states that he gets haldol D LAI 100 mg monthly and 5 mg BID. Pt reports compliance with medications and states that he recently received Haldol D injection although does not recall exactly when.He denies SI/HI/VH. He reports that he occasionally hears "random voices", denies CAH. He states that he always hears voices and does not feel that they have gotten worse and states that they have actually improved with recent haldol D dose adjustment. Pt states that he was previously on 50 mg but it was recently adjusted to 100 mg. Pt denies paranoia. Pt reports sleeping "ok" but admits to waking up a lot. He states that he will often lay in bed but sometimes gets up. Pt states that his landlady reported that he talks to himself a lot; pt states that "I talk to myself all the time. I always have". Pt does not appear to be upset or disturbed by this.  Pt provides consent for me to contact landlord (see below)  When asked what he would like assistance with, pt states that he would like a new psychiatrist as well as a Social worker. 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   On my interview, patient is in NAD, alert, oriented, calm, cooperative, and attentive. Affect is blunted with normal  speech, and behavior. Objectively, there is no evidence of psychosis/ mania (able to converse coherently, linear and goal directed thought, no RIS, no distractibility, not pre-occupied, no FOI, etc)  Overall, patient appears to be at the point, in the absence of inhibiting or disinhibiting symptoms, where he can successfully move to lesser restrictive setting for care.   (landlord) Barb Merino [phone] Called at 1:39 PM In the past 8 months his schizophrenia has "gotten worser" and is having a lot of outbursts and "is about to lose his breath when he is having these outbursts". "outbursts" are described as incidences where he is talking to himself. He gets angry at the voices and is walking back and forth in the middle of the night and has emphysema and becomes short of breath. He continues to smoke cigarettes despite having difficulty breathing.. Says that the medication that he is prescribed by Dr. Toy Care (haldol 5 mg BID) is something he only takes if she watches him take it and she has other responsibilities and cannot supervise twice a day. Recommended that if there is concern that patient is not taking his medication unless supervised,  that she supervise him taking his full dose (10 mg) when she goes to check on him and his roommates once a day if she is unable to stop by twice  a day. Verbalized understanding.  .  Says that she spoke to Dr. Toy Care that he "needs to go back to mental health because its beyond her as a psychiatrist". Gerald Nelson has never been violent or aggressive towards her. Got haldol D 100 mg on Monday, just increased 1 month ago. He can't clean up behind himself and he just sits and smokes and coughs so bad.  He has roommates, one is blind and one has a walker. She goes over everyday to check on them. Discussed that based on  evaluation today, he does not meet criteria for inpatient admission-pt is not suicidal or homicidal. Pt does report hearing voices but does not objectively appear psychotic and states that the voices do not bother him, that he always hears voices and that he feels they are better controlled than previously and pt is requesting discharge. She states that she will come pick him up but would like some additional resources for outpatient psychiatrists.    Psychiatric Specialty Exam  Presentation  General Appearance:Appropriate for Environment; Casual  Eye Contact:Fair  Speech:Clear and Coherent; Normal Rate  Speech Volume:Normal  Handedness:No data recorded  Mood and Affect  Mood:Euthymic  Affect:Blunt   Thought Process  Thought Processes:Coherent; Goal Directed; Linear  Descriptions of Associations:Intact  Orientation:Full (Time, Place and Person)  Thought Content:WDL    Hallucinations:Auditory "random voices"; denies CAH  Ideas of Reference:None  Suicidal Thoughts:No  Homicidal Thoughts:No   Sensorium  Memory:Immediate Good; Recent Good; Remote Fair  Judgment:Fair  Insight:Fair   Executive Functions  Concentration:Good  Attention Span:Good  [place]  Language:Good   Psychomotor Activity  Psychomotor Activity:Normal   Assets  Assets:Communication Skills; Desire for Improvement; Housing; Social Support; Vocational/Educational   Sleep  Sleep:Fair  Number of hours: No data recorded  No data recorded  Physical Exam: Physical Exam Constitutional:      Appearance: Normal appearance. He is normal weight.  HENT:     Head: Normocephalic and atraumatic.  Eyes:     Extraocular Movements: Extraocular movements intact.  Pulmonary:     Effort: Pulmonary effort is normal.     Comments: Coughs frequently throughout Musculoskeletal:     Cervical back: Normal range of motion.  Neurological:     Mental Status: He is alert.     Review of Systems  Constitutional: Negative for chills and fever.  Respiratory: Positive for cough.   Cardiovascular: Negative for chest pain.  Gastrointestinal: Negative for abdominal pain.  Musculoskeletal: Negative for myalgias.  Neurological: Negative for sensory change and speech change.  Psychiatric/Behavioral: Negative for depression and suicidal ideas.   Blood pressure (!) 151/107, pulse 81, temperature 97.7 F (36.5 C), temperature source Tympanic, resp. rate 16, SpO2 97 %. There is no height or weight on file to calculate BMI.  Musculoskeletal: Strength & Muscle Tone: within normal limits Gait & Station: normal Patient leans: N/A   [place] [facility] Discharge Disposition for Follow up and Recommendations: Based on my evaluation the patient does not appear to have an emergency medical condition and can be discharged with resources and follow up care in outpatient services for Medication Management and [place], MD 10/15/2020, 1:39 PM

## 2020-10-15 NOTE — Discharge Instructions (Signed)

## 2020-10-15 NOTE — ED Notes (Signed)
Discharge instructions provided and Pt stated understanding. Personal belongings returned from the orange locker. Pt escorted to the front lobby to wait for landlord to pick him up. Pt alert, orient and ambulatory. Safety maintained.

## 2020-10-23 ENCOUNTER — Other Ambulatory Visit (HOSPITAL_COMMUNITY): Payer: Self-pay | Admitting: Cardiology

## 2020-10-27 DIAGNOSIS — I509 Heart failure, unspecified: Secondary | ICD-10-CM | POA: Diagnosis not present

## 2020-10-27 DIAGNOSIS — K219 Gastro-esophageal reflux disease without esophagitis: Secondary | ICD-10-CM | POA: Diagnosis not present

## 2020-10-27 DIAGNOSIS — E785 Hyperlipidemia, unspecified: Secondary | ICD-10-CM | POA: Diagnosis not present

## 2020-10-27 DIAGNOSIS — I5022 Chronic systolic (congestive) heart failure: Secondary | ICD-10-CM | POA: Diagnosis not present

## 2020-10-27 DIAGNOSIS — J441 Chronic obstructive pulmonary disease with (acute) exacerbation: Secondary | ICD-10-CM | POA: Diagnosis not present

## 2020-10-27 DIAGNOSIS — N183 Chronic kidney disease, stage 3 unspecified: Secondary | ICD-10-CM | POA: Diagnosis not present

## 2020-10-27 DIAGNOSIS — I10 Essential (primary) hypertension: Secondary | ICD-10-CM | POA: Diagnosis not present

## 2020-10-27 DIAGNOSIS — J449 Chronic obstructive pulmonary disease, unspecified: Secondary | ICD-10-CM | POA: Diagnosis not present

## 2020-11-04 DIAGNOSIS — Z79891 Long term (current) use of opiate analgesic: Secondary | ICD-10-CM | POA: Diagnosis not present

## 2020-11-06 ENCOUNTER — Ambulatory Visit (INDEPENDENT_AMBULATORY_CARE_PROVIDER_SITE_OTHER)
Admission: RE | Admit: 2020-11-06 | Discharge: 2020-11-06 | Disposition: A | Discharge status: Home / Self Care | Payer: Medicare Other | Source: Ambulatory Visit

## 2020-11-06 ENCOUNTER — Other Ambulatory Visit: Payer: Self-pay

## 2020-11-06 DIAGNOSIS — Z87891 Personal history of nicotine dependence: Secondary | ICD-10-CM

## 2020-11-06 DIAGNOSIS — F17210 Nicotine dependence, cigarettes, uncomplicated: Secondary | ICD-10-CM

## 2020-11-13 ENCOUNTER — Other Ambulatory Visit: Payer: Self-pay | Admitting: *Deleted

## 2020-11-13 DIAGNOSIS — Z87891 Personal history of nicotine dependence: Secondary | ICD-10-CM

## 2020-11-13 DIAGNOSIS — F17210 Nicotine dependence, cigarettes, uncomplicated: Secondary | ICD-10-CM

## 2020-11-13 NOTE — Progress Notes (Signed)
Please call patient and let them  know their  low dose Ct was read as a Lung RADS 2: nodules that are benign in appearance and behavior with a very low likelihood of becoming a clinically active cancer due to size or lack of growth. Recommendation per radiology is for a repeat LDCT in 12 months. .Please let them  know we will order and schedule their  annual screening scan for 11/2021. Please let them  know there was notation of CAD on their  scan.  Please remind the patient  that this is a non-gated exam therefore degree or severity of disease  cannot be determined. Please have them  follow up with their PCP regarding potential risk factor modification, dietary therapy or pharmacologic therapy if clinically indicated. Pt.  is  currently on statin therapy. Please place order for annual  screening scan for  11/2021 and fax results to PCP. Thanks so much. 

## 2020-11-18 DIAGNOSIS — N183 Chronic kidney disease, stage 3 unspecified: Secondary | ICD-10-CM | POA: Diagnosis not present

## 2020-11-18 DIAGNOSIS — J441 Chronic obstructive pulmonary disease with (acute) exacerbation: Secondary | ICD-10-CM | POA: Diagnosis not present

## 2020-11-18 DIAGNOSIS — E785 Hyperlipidemia, unspecified: Secondary | ICD-10-CM | POA: Diagnosis not present

## 2020-11-18 DIAGNOSIS — I5022 Chronic systolic (congestive) heart failure: Secondary | ICD-10-CM | POA: Diagnosis not present

## 2020-11-18 DIAGNOSIS — K219 Gastro-esophageal reflux disease without esophagitis: Secondary | ICD-10-CM | POA: Diagnosis not present

## 2020-11-18 DIAGNOSIS — J449 Chronic obstructive pulmonary disease, unspecified: Secondary | ICD-10-CM | POA: Diagnosis not present

## 2020-11-18 DIAGNOSIS — I509 Heart failure, unspecified: Secondary | ICD-10-CM | POA: Diagnosis not present

## 2020-11-18 DIAGNOSIS — I10 Essential (primary) hypertension: Secondary | ICD-10-CM | POA: Diagnosis not present

## 2020-11-20 ENCOUNTER — Encounter (HOSPITAL_COMMUNITY): Payer: Medicare Other

## 2020-11-26 ENCOUNTER — Other Ambulatory Visit: Payer: Self-pay | Admitting: Internal Medicine

## 2020-11-26 ENCOUNTER — Other Ambulatory Visit: Payer: Self-pay | Admitting: Cardiology

## 2020-12-08 ENCOUNTER — Other Ambulatory Visit: Payer: Self-pay

## 2020-12-08 ENCOUNTER — Encounter (HOSPITAL_COMMUNITY): Payer: Self-pay

## 2020-12-08 ENCOUNTER — Ambulatory Visit (HOSPITAL_COMMUNITY)
Admission: RE | Admit: 2020-12-08 | Discharge: 2020-12-08 | Disposition: A | Discharge status: Home / Self Care | Payer: Medicare Other | Source: Ambulatory Visit | Attending: Cardiology | Admitting: Cardiology

## 2020-12-08 VITALS — BP 110/80 | HR 72 | Wt 133.4 lb

## 2020-12-08 DIAGNOSIS — I2510 Atherosclerotic heart disease of native coronary artery without angina pectoris: Secondary | ICD-10-CM | POA: Insufficient documentation

## 2020-12-08 DIAGNOSIS — J449 Chronic obstructive pulmonary disease, unspecified: Secondary | ICD-10-CM | POA: Insufficient documentation

## 2020-12-08 DIAGNOSIS — N183 Chronic kidney disease, stage 3 unspecified: Secondary | ICD-10-CM | POA: Insufficient documentation

## 2020-12-08 DIAGNOSIS — I130 Hypertensive heart and chronic kidney disease with heart failure and stage 1 through stage 4 chronic kidney disease, or unspecified chronic kidney disease: Secondary | ICD-10-CM | POA: Insufficient documentation

## 2020-12-08 DIAGNOSIS — Z8673 Personal history of transient ischemic attack (TIA), and cerebral infarction without residual deficits: Secondary | ICD-10-CM | POA: Diagnosis not present

## 2020-12-08 DIAGNOSIS — I252 Old myocardial infarction: Secondary | ICD-10-CM | POA: Insufficient documentation

## 2020-12-08 DIAGNOSIS — Z882 Allergy status to sulfonamides status: Secondary | ICD-10-CM | POA: Diagnosis not present

## 2020-12-08 DIAGNOSIS — Z7982 Long term (current) use of aspirin: Secondary | ICD-10-CM | POA: Diagnosis not present

## 2020-12-08 DIAGNOSIS — F209 Schizophrenia, unspecified: Secondary | ICD-10-CM | POA: Diagnosis not present

## 2020-12-08 DIAGNOSIS — I272 Pulmonary hypertension, unspecified: Secondary | ICD-10-CM | POA: Diagnosis not present

## 2020-12-08 DIAGNOSIS — I5042 Chronic combined systolic (congestive) and diastolic (congestive) heart failure: Secondary | ICD-10-CM | POA: Diagnosis not present

## 2020-12-08 DIAGNOSIS — I340 Nonrheumatic mitral (valve) insufficiency: Secondary | ICD-10-CM | POA: Diagnosis not present

## 2020-12-08 DIAGNOSIS — Z79899 Other long term (current) drug therapy: Secondary | ICD-10-CM | POA: Insufficient documentation

## 2020-12-08 DIAGNOSIS — F17210 Nicotine dependence, cigarettes, uncomplicated: Secondary | ICD-10-CM | POA: Diagnosis not present

## 2020-12-08 DIAGNOSIS — Z8249 Family history of ischemic heart disease and other diseases of the circulatory system: Secondary | ICD-10-CM | POA: Insufficient documentation

## 2020-12-08 LAB — LIPID PANEL
Cholesterol: 145 mg/dL (ref 0–200)
HDL: 78 mg/dL (ref >40)
LDL Cholesterol: 58 mg/dL (ref 0–99)
Total CHOL/HDL Ratio: 1.9 RATIO
Triglycerides: 44 mg/dL (ref <150)
VLDL: 9 mg/dL (ref 0–40)

## 2020-12-08 NOTE — Patient Instructions (Signed)
It was great to see you today! No medication changes are needed at this time.   Labs today We will only contact you if something comes back abnormal or we need to make some changes. Otherwise no news is good news!  Your physician recommends that you schedule a follow-up appointment in: 4 months with Dr Aundra Dubin   Do the following things EVERYDAY: 1) Weigh yourself in the morning before breakfast. Write it down and keep it in a log. 2) Take your medicines as prescribed 3) Eat low salt foods--Limit salt (sodium) to 2000 mg per day.  4) Stay as active as you can everyday 5) Limit all fluids for the day to less than 2 liters   At the [facility], you and your health needs are our priority. As part of our continuing mission to provide you with exceptional heart care, we have created designated Provider Care Teams. These Care Teams include your primary Cardiologist (physician) and Advanced Practice Providers (APPs- Physician Assistants and Nurse Practitioners) who all work together to provide you with the care you need, when you need it.   You may see any of the following providers on your designated Care Team at your next follow up: Marland Kitchen Dr Glori Bickers . Dr Loralie Champagne . Dr Vickki Muff . Darrick Grinder, NP . Lyda Jester, [place] . Audry Riles, PharmD   Please be sure to bring in all your medications bottles to every appointment.   If you have any questions or concerns before your next appointment please send [place] a message through [place] or call our office at [phone].    TO LEAVE A MESSAGE FOR THE NURSE SELECT OPTION 2, PLEASE LEAVE A MESSAGE INCLUDING: . YOUR NAME . DATE OF BIRTH . CALL BACK NUMBER . REASON FOR CALL**this is important as we prioritize the call backs  YOU WILL RECEIVE A CALL BACK THE SAME DAY AS LONG AS YOU CALL BEFORE 4:00 PM

## 2020-12-08 NOTE — Progress Notes (Signed)
Date:  12/08/2020   ID:  [name], DOB 10-07-1950, MRN [id number]   Provider location:  Advanced Heart Failure Type of Visit: Established patient   PCP:  [name], MD  Cardiologist:  Dr. Aundra Dubin  Chief Complaint: Shortness of breath   History of Present Illness: Gerald Nelson is a 70 y.o. male who has a past medical history of chronic systolic CHF (EF 60% in July 2018), severe MR/TR, pulmonary HTN, CVA, CKD, schizophrenia, HTN and tobacco abuse.   Gerald Nelson was admitted to [facility] 7/2-01/06/17 for newly diagnosed acute systolic CHF. Echo showed LVEF 15%, severe global hypokinesis, moderate LVH, coarsetrabeculation of the LV apex with numerous false tendinae of the left ventricle, very stagnant blood flow at the LV apex withsmoke but no obvious LV thrombus - Definity contrast was given,again, noting stagnant apical blood flow - this could suggestrecent thrombus and certainly high risk for apical thrombusformation. Other findings include aortic sclerosis with mild AI,moderate to severe MR, moderate LAE, mild RAE, moderate to severeTR, moderate to severe pulmonary hypertension (RVSP 73 mmHg), dilated IVC, trivial posterior pericardial effusion.   He was admitted again in 10/18. He diuresed 25 pounds on IV lasix. He refused cath. His clonidine and diltazem were stopped during this admission. Referred to paramedicine. Discharge weight 133 pounds.   Echo 08/11/17 LVEF 15-20%, no MR noted.    Echo in 7/20 showed improvement in EF to 50-55%, moderate LVH, normal RV size and systolic function.  Echo 9/21, EF 55% with moderate LVH, RV normal, IVC normal.   PYP Scan 05/2020 Visual and quantitative assessment (grade 1, H/CLL equal 1.0) was NOT suggestive of transthyretin amyloidosis. Multiple Myeloma panel negative. Urine immunofixation normal.   He presents today for followup of CHF.  Doing fairly well. No longer working at [employer]. He currently works at [employer]. Doing ok  w/ work duties and ADLs, NYHA Class II. Denies CP. Reports full med compliance and tolerating well w/o side effects. Has bubble packs. BP is well controlled at 110/80. Still smoking ~1/2 ppd, not interested in quitting.     Labs (6/19): K 3.8, creatinine 2.2 Labs (11/19): K 4.1, creatinine 1.95, LDL 54, HDl 54 Labs (12/19): K 4.6, creatinine 1.89 LabS (2/20): K 4.5, creatinine 1.68  Labs (4/20): K 4.5, creatinine 1.58 Labs (9/20): K 5.1, creatinine 2.02 Labs (10/20): LDL 67, K 4.8, creatinine 1.92 Labs (3/21): K 4.8, creatinine 1.97 Labs (10/21): Scr 1.68, K 4.7   PMH: 1. Chronic systolic CHF:  Cardiomyopathy of uncertain etiology, refused cath. He also refused ICD.  - Echo (7/18): Moderate LVH, severe FBSH, trabeculation at apex not meeting criteria for noncompaction, EF 15%, moderate to severe MR, low normal RV systolic function, moderate-severe TR.  - Echo (2/19): Moderate LVH, EF 15-20%, No mitral regurgitation, normal RV size and systolic function, mild TR.  - Echo (7/20): EF 50-55%, moderate LVH, normal RV size and systolic function.  - Echo (9/21): EF 55%, moderate LVH, RV normal, IVC normal.  2. H/o CVA 3. Schizophrenia 4. CKD stage 3 5. Mitral regurgitation: Suspect functional. Moderate-severe MR on 7/18 echo, minimal MR on 2/19 echo.  6. COPD: He is an active smoker.  - PFTs (12/18) suggestive of severe COPD.  7. H/o HTN 8. ? apical mural thrombus: Not seen on most recent echo in 2/19.  9. CAD: 1/19 CT chest showed coronary calcification.   Current Outpatient Medications  Medication Sig Dispense Refill  . albuterol (PROVENTIL HFA;VENTOLIN HFA)  108 (90 Base) MCG/ACT inhaler Inhale 1-2 puffs into the lungs every 6 (six) hours as needed for wheezing or shortness of breath.    . ASPIRIN LOW DOSE 81 MG EC tablet TAKE ONE TABLET BY MOUTH ONCE DAILY (Patient taking differently: Take 81 mg by mouth daily.) 90 tablet 3  . atorvastatin (LIPITOR) 40 MG tablet TAKE ONE TABLET BY  MOUTH EVERY DAY IN THE EVENING 30 tablet 6  . BIDIL 20-37.5 MG tablet TAKE 1 TABLET BY MOUTH 3 (THREE) TIMES DAILY. 180 tablet 6  . bisoprolol (ZEBETA) 10 MG tablet TAKE 1 TABLET (10 MG TOTAL) BY MOUTH DAILY. (MORNING) 90 tablet 3  . Fluticasone-Umeclidin-Vilant (TRELEGY ELLIPTA) 100-62.5-25 MCG/INH AEPB Inhale 1 puff into the lungs daily. 2 each 0  . furosemide (LASIX) 20 MG tablet TAKE ONE TABLET( 20 MG TOTAL) BY MOUTH ONCE DAILY(MORNING) 30 tablet 3  . haloperidol (HALDOL) 5 MG tablet Take 5 mg by mouth 2 (two) times daily.    . haloperidol decanoate (HALDOL DECANOATE) 100 MG/ML injection Inject 100 mg into the muscle every 28 (twenty-eight) days.     . hydrOXYzine (VISTARIL) 25 MG capsule Take 25 mg by mouth 2 (two) times daily.    Marland Kitchen losartan (COZAAR) 25 MG tablet TAKE ONE TABLET BY MOUTH TWICE DAILY (AM+BEDTIME) 180 tablet 3  . potassium chloride SA (KLOR-CON) 20 MEQ tablet TAKE ONE TABLET BY MOUTH ONCE DAILY (AM) 90 tablet 1  . spironolactone (ALDACTONE) 25 MG tablet TAKE 1 TABLET (25 MG TOTAL) BY MOUTH AT BEDTIME. 30 tablet 6   No current facility-administered medications for this encounter.    Allergies:   Sulfa antibiotics   Social History:  The patient  reports that he has been smoking cigarettes. He started smoking about 48 years ago. He has a 70.50 pack-year smoking history. He has never used smokeless tobacco. He reports that he does not drink alcohol and does not use drugs.   Family History:  The patient's family history includes Hypertension in his father and mother.   ROS:  Please see the history of present illness.   All other systems are personally reviewed and negative.   Exam:   BP 110/80   Pulse 72   Wt 60.5 kg (133 lb 6.4 oz)   SpO2 96%   BMI 19.14 kg/m   General:  Well appearing, thin elderly AAM. No respiratory difficulty HEENT: dentition in ooor repair, otherwise normal Neck: supple.  JVD not elevated. Carotids 2+ bilat; no bruits. No lymphadenopathy or  thyromegaly appreciated. Cor: PMI nondisplaced. Regular rate & rhythm. No rubs, gallops or murmurs. Lungs: diffuse bilateral expiratory rhonchi, no wheezing or rales  Abdomen: soft, nontender, nondistended. No hepatosplenomegaly. No bruits or masses. Good bowel sounds. Extremities: no cyanosis, clubbing, rash, edema Neuro: alert & oriented x 3, cranial nerves grossly intact. moves all 4 extremities w/o difficulty. Affect pleasant.    Recent Labs: 05/02/2020: BUN 31; Creatinine, Ser 1.68; Hemoglobin 12.5; Platelets 173; Potassium 4.7; Sodium 139  Personally reviewed   Wt Readings from Last 3 Encounters:  12/08/20 60.5 kg (133 lb 6.4 oz)  05/02/20 62.9 kg (138 lb 11.2 oz)  04/01/20 59.1 kg (130 lb 6.4 oz)      ASSESSMENT AND PLAN:  1. Chronic systolic => diastolic CHF: Echo 0/9735 EF 15% with moderate-severe MR. Echo 08/11/17 LVEF 15-20%, no MR noted. Cardiomyopathy of uncertain etiology, he has refused cath in the past.  Echo in 7/20 showed improvement in EF to 50-55%.  Echo 9/21 showed  EF stable 55%, moderate LVH, normal RV.  PYP Scan 11/21 not suggestive of transthyretin amyloidosis. Myeloma panel and urine immunofixation normal.  He has been reluctant to have heart cath, think we can hold off for now with improvement in EF and lack of CP. Volume status good on exam. Euvolemic. NYHA Class II. BP well controlled. Continue current med regimen (using bubble packs).  - Continue Lasix 20 mg daily - Continue bisoprolol 10 mg daily.    - Continue losartan 25 mg bid.  - Continue spironolactone 25 mg daily.  - Continue Bidil 1 tab tid  - He is out of ICD range now, refused ICD in past.   - Check BMP today  2. Questionable apical thrombus: Not seen on 7/20 or 9/21 echoes. He is not anticoagulated.  3. Mitral regurgitation: Moderate to severe on 7/18 echo but trivial on 7/20 and 9/21 echoes.  Suspect functional MR, improved with improved LV function.  4. COPD: He continues to smoke.  Severe COPD  by prior PFTs.  - he continues to smoke 1/2 ppd. Smoking cessation advised but he is not interested in quitting.  5. CKD stage III: Check BMP today  6. Schizophrenia: Per PCP.  7. CAD: Noted on CT chest.  ECG with old anteroseptal MI, but echo 9/21 shows EF back in normal range.  - no s/s of ischemia  - Continue ASA 81.  - Continue atorvastatin. Check Lipid panel and HFTs. LDL goal < 70 mg/dL  F/u w/ Dr. Aundra Dubin in 4 months    Signed, Lyda Jester, PA-C  12/08/2020  [address] [place] [phone] (office) [phone] (fax)

## 2020-12-27 ENCOUNTER — Other Ambulatory Visit: Payer: Self-pay | Admitting: Cardiology

## 2021-01-08 DIAGNOSIS — R42 Dizziness and giddiness: Secondary | ICD-10-CM | POA: Diagnosis not present

## 2021-01-20 DIAGNOSIS — Z20822 Contact with and (suspected) exposure to covid-19: Secondary | ICD-10-CM | POA: Diagnosis not present

## 2021-01-27 DIAGNOSIS — Z20822 Contact with and (suspected) exposure to covid-19: Secondary | ICD-10-CM | POA: Diagnosis not present

## 2021-02-01 DIAGNOSIS — N183 Chronic kidney disease, stage 3 unspecified: Secondary | ICD-10-CM | POA: Diagnosis not present

## 2021-02-01 DIAGNOSIS — I509 Heart failure, unspecified: Secondary | ICD-10-CM | POA: Diagnosis not present

## 2021-02-01 DIAGNOSIS — E785 Hyperlipidemia, unspecified: Secondary | ICD-10-CM | POA: Diagnosis not present

## 2021-02-01 DIAGNOSIS — J441 Chronic obstructive pulmonary disease with (acute) exacerbation: Secondary | ICD-10-CM | POA: Diagnosis not present

## 2021-02-01 DIAGNOSIS — K219 Gastro-esophageal reflux disease without esophagitis: Secondary | ICD-10-CM | POA: Diagnosis not present

## 2021-02-01 DIAGNOSIS — J449 Chronic obstructive pulmonary disease, unspecified: Secondary | ICD-10-CM | POA: Diagnosis not present

## 2021-02-01 DIAGNOSIS — I10 Essential (primary) hypertension: Secondary | ICD-10-CM | POA: Diagnosis not present

## 2021-02-01 DIAGNOSIS — I5022 Chronic systolic (congestive) heart failure: Secondary | ICD-10-CM | POA: Diagnosis not present

## 2021-02-02 DIAGNOSIS — N183 Chronic kidney disease, stage 3 unspecified: Secondary | ICD-10-CM | POA: Diagnosis not present

## 2021-02-02 DIAGNOSIS — E46 Unspecified protein-calorie malnutrition: Secondary | ICD-10-CM | POA: Diagnosis not present

## 2021-02-02 DIAGNOSIS — J449 Chronic obstructive pulmonary disease, unspecified: Secondary | ICD-10-CM | POA: Diagnosis not present

## 2021-02-02 DIAGNOSIS — I700 Atherosclerosis of aorta: Secondary | ICD-10-CM | POA: Diagnosis not present

## 2021-02-02 DIAGNOSIS — I5022 Chronic systolic (congestive) heart failure: Secondary | ICD-10-CM | POA: Diagnosis not present

## 2021-02-02 DIAGNOSIS — I10 Essential (primary) hypertension: Secondary | ICD-10-CM | POA: Diagnosis not present

## 2021-02-02 DIAGNOSIS — F17210 Nicotine dependence, cigarettes, uncomplicated: Secondary | ICD-10-CM | POA: Diagnosis not present

## 2021-02-20 ENCOUNTER — Other Ambulatory Visit (HOSPITAL_COMMUNITY): Payer: Self-pay | Admitting: Cardiology

## 2021-02-25 DIAGNOSIS — N2581 Secondary hyperparathyroidism of renal origin: Secondary | ICD-10-CM | POA: Diagnosis not present

## 2021-02-25 DIAGNOSIS — N189 Chronic kidney disease, unspecified: Secondary | ICD-10-CM | POA: Diagnosis not present

## 2021-02-25 DIAGNOSIS — E785 Hyperlipidemia, unspecified: Secondary | ICD-10-CM | POA: Diagnosis not present

## 2021-02-25 DIAGNOSIS — D631 Anemia in chronic kidney disease: Secondary | ICD-10-CM | POA: Diagnosis not present

## 2021-02-25 DIAGNOSIS — N184 Chronic kidney disease, stage 4 (severe): Secondary | ICD-10-CM | POA: Diagnosis not present

## 2021-02-25 DIAGNOSIS — I129 Hypertensive chronic kidney disease with stage 1 through stage 4 chronic kidney disease, or unspecified chronic kidney disease: Secondary | ICD-10-CM | POA: Diagnosis not present

## 2021-02-25 DIAGNOSIS — R42 Dizziness and giddiness: Secondary | ICD-10-CM | POA: Diagnosis not present

## 2021-02-25 DIAGNOSIS — I509 Heart failure, unspecified: Secondary | ICD-10-CM | POA: Diagnosis not present

## 2021-02-27 ENCOUNTER — Other Ambulatory Visit: Payer: Self-pay | Admitting: Nephrology

## 2021-02-27 DIAGNOSIS — N184 Chronic kidney disease, stage 4 (severe): Secondary | ICD-10-CM

## 2021-02-27 DIAGNOSIS — I129 Hypertensive chronic kidney disease with stage 1 through stage 4 chronic kidney disease, or unspecified chronic kidney disease: Secondary | ICD-10-CM

## 2021-03-04 ENCOUNTER — Other Ambulatory Visit: Payer: Medicare Other

## 2021-03-04 DIAGNOSIS — J441 Chronic obstructive pulmonary disease with (acute) exacerbation: Secondary | ICD-10-CM | POA: Diagnosis not present

## 2021-03-04 DIAGNOSIS — I509 Heart failure, unspecified: Secondary | ICD-10-CM | POA: Diagnosis not present

## 2021-03-04 DIAGNOSIS — K219 Gastro-esophageal reflux disease without esophagitis: Secondary | ICD-10-CM | POA: Diagnosis not present

## 2021-03-04 DIAGNOSIS — I10 Essential (primary) hypertension: Secondary | ICD-10-CM | POA: Diagnosis not present

## 2021-03-04 DIAGNOSIS — J449 Chronic obstructive pulmonary disease, unspecified: Secondary | ICD-10-CM | POA: Diagnosis not present

## 2021-03-04 DIAGNOSIS — I5022 Chronic systolic (congestive) heart failure: Secondary | ICD-10-CM | POA: Diagnosis not present

## 2021-03-04 DIAGNOSIS — E785 Hyperlipidemia, unspecified: Secondary | ICD-10-CM | POA: Diagnosis not present

## 2021-03-04 DIAGNOSIS — N184 Chronic kidney disease, stage 4 (severe): Secondary | ICD-10-CM | POA: Diagnosis not present

## 2021-03-04 DIAGNOSIS — F17210 Nicotine dependence, cigarettes, uncomplicated: Secondary | ICD-10-CM | POA: Diagnosis not present

## 2021-03-05 ENCOUNTER — Other Ambulatory Visit: Payer: Medicare Other

## 2021-03-11 ENCOUNTER — Ambulatory Visit
Admission: RE | Admit: 2021-03-11 | Discharge: 2021-03-11 | Disposition: A | Discharge status: Home / Self Care | Payer: Medicare Other | Source: Ambulatory Visit | Attending: Nephrology | Admitting: Nephrology

## 2021-03-11 DIAGNOSIS — I129 Hypertensive chronic kidney disease with stage 1 through stage 4 chronic kidney disease, or unspecified chronic kidney disease: Secondary | ICD-10-CM

## 2021-03-11 DIAGNOSIS — N189 Chronic kidney disease, unspecified: Secondary | ICD-10-CM | POA: Diagnosis not present

## 2021-03-11 DIAGNOSIS — N184 Chronic kidney disease, stage 4 (severe): Secondary | ICD-10-CM

## 2021-03-12 DIAGNOSIS — J449 Chronic obstructive pulmonary disease, unspecified: Secondary | ICD-10-CM | POA: Diagnosis not present

## 2021-03-12 DIAGNOSIS — I5022 Chronic systolic (congestive) heart failure: Secondary | ICD-10-CM | POA: Diagnosis not present

## 2021-03-16 DIAGNOSIS — N184 Chronic kidney disease, stage 4 (severe): Secondary | ICD-10-CM | POA: Diagnosis not present

## 2021-03-20 ENCOUNTER — Inpatient Hospital Stay (HOSPITAL_COMMUNITY)
Admission: EM | Admit: 2021-03-20 | Discharge: 2021-03-26 | DRG: 682 | Disposition: A | Discharge status: Home Health | Payer: Medicare Other | Attending: Internal Medicine | Admitting: Internal Medicine

## 2021-03-20 ENCOUNTER — Emergency Department (HOSPITAL_COMMUNITY): Payer: Medicare Other

## 2021-03-20 DIAGNOSIS — Z7982 Long term (current) use of aspirin: Secondary | ICD-10-CM | POA: Diagnosis not present

## 2021-03-20 DIAGNOSIS — I499 Cardiac arrhythmia, unspecified: Secondary | ICD-10-CM | POA: Diagnosis not present

## 2021-03-20 DIAGNOSIS — E785 Hyperlipidemia, unspecified: Secondary | ICD-10-CM | POA: Diagnosis present

## 2021-03-20 DIAGNOSIS — I509 Heart failure, unspecified: Secondary | ICD-10-CM

## 2021-03-20 DIAGNOSIS — Z87891 Personal history of nicotine dependence: Secondary | ICD-10-CM

## 2021-03-20 DIAGNOSIS — R6889 Other general symptoms and signs: Secondary | ICD-10-CM | POA: Diagnosis not present

## 2021-03-20 DIAGNOSIS — I340 Nonrheumatic mitral (valve) insufficiency: Secondary | ICD-10-CM | POA: Diagnosis present

## 2021-03-20 DIAGNOSIS — I959 Hypotension, unspecified: Secondary | ICD-10-CM | POA: Diagnosis present

## 2021-03-20 DIAGNOSIS — I130 Hypertensive heart and chronic kidney disease with heart failure and stage 1 through stage 4 chronic kidney disease, or unspecified chronic kidney disease: Secondary | ICD-10-CM | POA: Diagnosis present

## 2021-03-20 DIAGNOSIS — R7881 Bacteremia: Secondary | ICD-10-CM | POA: Diagnosis present

## 2021-03-20 DIAGNOSIS — F17210 Nicotine dependence, cigarettes, uncomplicated: Secondary | ICD-10-CM | POA: Diagnosis present

## 2021-03-20 DIAGNOSIS — K5900 Constipation, unspecified: Secondary | ICD-10-CM | POA: Diagnosis present

## 2021-03-20 DIAGNOSIS — J690 Pneumonitis due to inhalation of food and vomit: Secondary | ICD-10-CM | POA: Diagnosis not present

## 2021-03-20 DIAGNOSIS — I639 Cerebral infarction, unspecified: Secondary | ICD-10-CM | POA: Diagnosis present

## 2021-03-20 DIAGNOSIS — Z8673 Personal history of transient ischemic attack (TIA), and cerebral infarction without residual deficits: Secondary | ICD-10-CM | POA: Diagnosis not present

## 2021-03-20 DIAGNOSIS — Z20822 Contact with and (suspected) exposure to covid-19: Secondary | ICD-10-CM | POA: Diagnosis not present

## 2021-03-20 DIAGNOSIS — Z882 Allergy status to sulfonamides status: Secondary | ICD-10-CM | POA: Diagnosis not present

## 2021-03-20 DIAGNOSIS — R946 Abnormal results of thyroid function studies: Secondary | ICD-10-CM | POA: Diagnosis not present

## 2021-03-20 DIAGNOSIS — Z720 Tobacco use: Secondary | ICD-10-CM | POA: Diagnosis present

## 2021-03-20 DIAGNOSIS — B9620 Unspecified Escherichia coli [E. coli] as the cause of diseases classified elsewhere: Secondary | ICD-10-CM | POA: Diagnosis present

## 2021-03-20 DIAGNOSIS — Z79899 Other long term (current) drug therapy: Secondary | ICD-10-CM | POA: Diagnosis not present

## 2021-03-20 DIAGNOSIS — I5032 Chronic diastolic (congestive) heart failure: Secondary | ICD-10-CM | POA: Diagnosis not present

## 2021-03-20 DIAGNOSIS — M7989 Other specified soft tissue disorders: Secondary | ICD-10-CM | POA: Diagnosis not present

## 2021-03-20 DIAGNOSIS — N179 Acute kidney failure, unspecified: Principal | ICD-10-CM

## 2021-03-20 DIAGNOSIS — Z8249 Family history of ischemic heart disease and other diseases of the circulatory system: Secondary | ICD-10-CM

## 2021-03-20 DIAGNOSIS — D72829 Elevated white blood cell count, unspecified: Secondary | ICD-10-CM | POA: Diagnosis present

## 2021-03-20 DIAGNOSIS — F419 Anxiety disorder, unspecified: Secondary | ICD-10-CM | POA: Diagnosis present

## 2021-03-20 DIAGNOSIS — Z0389 Encounter for observation for other suspected diseases and conditions ruled out: Secondary | ICD-10-CM | POA: Diagnosis not present

## 2021-03-20 DIAGNOSIS — N183 Chronic kidney disease, stage 3 unspecified: Secondary | ICD-10-CM | POA: Diagnosis present

## 2021-03-20 DIAGNOSIS — J441 Chronic obstructive pulmonary disease with (acute) exacerbation: Secondary | ICD-10-CM | POA: Diagnosis not present

## 2021-03-20 DIAGNOSIS — N1832 Chronic kidney disease, stage 3b: Secondary | ICD-10-CM | POA: Diagnosis not present

## 2021-03-20 DIAGNOSIS — R0602 Shortness of breath: Secondary | ICD-10-CM

## 2021-03-20 DIAGNOSIS — I272 Pulmonary hypertension, unspecified: Secondary | ICD-10-CM | POA: Diagnosis present

## 2021-03-20 DIAGNOSIS — I447 Left bundle-branch block, unspecified: Secondary | ICD-10-CM | POA: Diagnosis not present

## 2021-03-20 DIAGNOSIS — R069 Unspecified abnormalities of breathing: Secondary | ICD-10-CM | POA: Diagnosis not present

## 2021-03-20 DIAGNOSIS — J9601 Acute respiratory failure with hypoxia: Secondary | ICD-10-CM | POA: Diagnosis present

## 2021-03-20 DIAGNOSIS — J449 Chronic obstructive pulmonary disease, unspecified: Secondary | ICD-10-CM | POA: Diagnosis not present

## 2021-03-20 DIAGNOSIS — I517 Cardiomegaly: Secondary | ICD-10-CM | POA: Diagnosis not present

## 2021-03-20 DIAGNOSIS — J90 Pleural effusion, not elsewhere classified: Secondary | ICD-10-CM | POA: Diagnosis not present

## 2021-03-20 DIAGNOSIS — R0600 Dyspnea, unspecified: Secondary | ICD-10-CM | POA: Diagnosis not present

## 2021-03-20 DIAGNOSIS — F209 Schizophrenia, unspecified: Secondary | ICD-10-CM | POA: Diagnosis present

## 2021-03-20 DIAGNOSIS — Z7951 Long term (current) use of inhaled steroids: Secondary | ICD-10-CM

## 2021-03-20 DIAGNOSIS — R0689 Other abnormalities of breathing: Secondary | ICD-10-CM | POA: Diagnosis not present

## 2021-03-20 DIAGNOSIS — I10 Essential (primary) hypertension: Secondary | ICD-10-CM | POA: Diagnosis not present

## 2021-03-20 DIAGNOSIS — I5022 Chronic systolic (congestive) heart failure: Secondary | ICD-10-CM

## 2021-03-20 DIAGNOSIS — Z781 Physical restraint status: Secondary | ICD-10-CM

## 2021-03-20 DIAGNOSIS — E872 Acidosis: Secondary | ICD-10-CM | POA: Diagnosis not present

## 2021-03-20 DIAGNOSIS — Z743 Need for continuous supervision: Secondary | ICD-10-CM | POA: Diagnosis not present

## 2021-03-20 DIAGNOSIS — R1310 Dysphagia, unspecified: Secondary | ICD-10-CM | POA: Diagnosis present

## 2021-03-20 LAB — CBC WITH DIFFERENTIAL/PLATELET
Abs Immature Granulocytes: 0.36 10*3/uL — ABNORMAL HIGH (ref 0.00–0.07)
Basophils Absolute: 0.1 10*3/uL (ref 0.0–0.1)
Basophils Relative: 0 %
Eosinophils Absolute: 0 10*3/uL (ref 0.0–0.5)
Eosinophils Relative: 0 %
HCT: 32.1 % — ABNORMAL LOW (ref 39.0–52.0)
Hemoglobin: 11.2 g/dL — ABNORMAL LOW (ref 13.0–17.0)
Immature Granulocytes: 2 %
Lymphocytes Relative: 2 %
Lymphs Abs: 0.5 10*3/uL — ABNORMAL LOW (ref 0.7–4.0)
MCH: 29.9 pg (ref 26.0–34.0)
MCHC: 34.9 g/dL (ref 30.0–36.0)
MCV: 85.6 fL (ref 80.0–100.0)
Monocytes Absolute: 2.7 10*3/uL — ABNORMAL HIGH (ref 0.1–1.0)
Monocytes Relative: 11 %
Neutro Abs: 19.8 10*3/uL — ABNORMAL HIGH (ref 1.7–7.7)
Neutrophils Relative %: 85 %
Platelets: 111 10*3/uL — ABNORMAL LOW (ref 150–400)
RBC: 3.75 MIL/uL — ABNORMAL LOW (ref 4.22–5.81)
RDW: 13.3 % (ref 11.5–15.5)
WBC: 23.4 10*3/uL — ABNORMAL HIGH (ref 4.0–10.5)
nRBC: 0 % (ref 0.0–0.2)

## 2021-03-20 LAB — COMPREHENSIVE METABOLIC PANEL
ALT: 23 U/L (ref 0–44)
AST: 29 U/L (ref 15–41)
Albumin: 2.9 g/dL — ABNORMAL LOW (ref 3.5–5.0)
Alkaline Phosphatase: 76 U/L (ref 38–126)
Anion gap: 13 (ref 5–15)
BUN: 95 mg/dL — ABNORMAL HIGH (ref 8–23)
CO2: 22 mmol/L (ref 22–32)
Calcium: 8.8 mg/dL — ABNORMAL LOW (ref 8.9–10.3)
Chloride: 102 mmol/L (ref 98–111)
Creatinine, Ser: 4.38 mg/dL — ABNORMAL HIGH (ref 0.61–1.24)
GFR, Estimated: 14 mL/min — ABNORMAL LOW (ref >60)
Glucose, Bld: 118 mg/dL — ABNORMAL HIGH (ref 70–99)
Potassium: 4.4 mmol/L (ref 3.5–5.1)
Sodium: 137 mmol/L (ref 135–145)
Total Bilirubin: 1.7 mg/dL — ABNORMAL HIGH (ref 0.3–1.2)
Total Protein: 6.8 g/dL (ref 6.5–8.1)

## 2021-03-20 LAB — I-STAT CHEM 8, ED
BUN: 88 mg/dL — ABNORMAL HIGH (ref 8–23)
Calcium, Ion: 1.15 mmol/L (ref 1.15–1.40)
Chloride: 105 mmol/L (ref 98–111)
Creatinine, Ser: 4.5 mg/dL — ABNORMAL HIGH (ref 0.61–1.24)
Glucose, Bld: 109 mg/dL — ABNORMAL HIGH (ref 70–99)
HCT: 32 % — ABNORMAL LOW (ref 39.0–52.0)
Hemoglobin: 10.9 g/dL — ABNORMAL LOW (ref 13.0–17.0)
Potassium: 4.6 mmol/L (ref 3.5–5.1)
Sodium: 136 mmol/L (ref 135–145)
TCO2: 23 mmol/L (ref 22–32)

## 2021-03-20 LAB — BRAIN NATRIURETIC PEPTIDE: B Natriuretic Peptide: 193.2 pg/mL — ABNORMAL HIGH (ref 0.0–100.0)

## 2021-03-20 LAB — RESP PANEL BY RT-PCR (FLU A&B, COVID) ARPGX2
Influenza A by PCR: NEGATIVE
Influenza B by PCR: NEGATIVE
SARS Coronavirus 2 by RT PCR: NEGATIVE

## 2021-03-20 LAB — D-DIMER, QUANTITATIVE: D-Dimer, Quant: 6.05 ug/mL-FEU — ABNORMAL HIGH (ref 0.00–0.50)

## 2021-03-20 LAB — TROPONIN I (HIGH SENSITIVITY)
Troponin I (High Sensitivity): 25 ng/L — ABNORMAL HIGH (ref <18)
Troponin I (High Sensitivity): 25 ng/L — ABNORMAL HIGH (ref <18)

## 2021-03-20 LAB — BLOOD GAS, VENOUS
Acid-base deficit: 5.5 mmol/L — ABNORMAL HIGH (ref 0.0–2.0)
Bicarbonate: 22.1 mmol/L (ref 20.0–28.0)
O2 Saturation: 34.5 %
Patient temperature: 98.6
pCO2, Ven: 56.2 mmHg (ref 44.0–60.0)
pH, Ven: 7.219 — ABNORMAL LOW (ref 7.250–7.430)
pO2, Ven: <31 mmHg — CL (ref 32.0–45.0)

## 2021-03-20 LAB — TSH: TSH: 0.329 u[IU]/mL — ABNORMAL LOW (ref 0.350–4.500)

## 2021-03-20 LAB — LACTIC ACID, PLASMA
Lactic Acid, Venous: 1.7 mmol/L (ref 0.5–1.9)
Lactic Acid, Venous: 1.7 mmol/L (ref 0.5–1.9)

## 2021-03-20 MED ORDER — PHENYLEPHRINE 40 MCG/ML (10ML) SYRINGE FOR IV PUSH (FOR BLOOD PRESSURE SUPPORT): 80.0000 ug | PREFILLED_SYRINGE | Freq: Once | INTRAVENOUS | Status: DC

## 2021-03-20 MED ORDER — ALBUTEROL SULFATE (2.5 MG/3ML) 0.083% IN NEBU: 2.5000 mg | INHALATION_SOLUTION | RESPIRATORY_TRACT | Status: DC | PRN

## 2021-03-20 MED ORDER — HEPARIN SODIUM (PORCINE) 5000 UNIT/ML IJ SOLN
5000.0000 [IU] | Freq: Three times a day (TID) | INTRAMUSCULAR | Status: DC
  Administered 2021-03-21: 5000 [IU] via SUBCUTANEOUS
  Filled 2021-03-20: qty 1

## 2021-03-20 MED ORDER — ONDANSETRON HCL 4 MG PO TABS: 4.0000 mg | ORAL_TABLET | Freq: Four times a day (QID) | ORAL | Status: DC | PRN

## 2021-03-20 MED ORDER — POLYETHYLENE GLYCOL 3350 17 G PO PACK: 17.0000 g | PACK | Freq: Every day | ORAL | Status: DC | PRN

## 2021-03-20 MED ORDER — PREDNISONE 20 MG PO TABS: 40.0000 mg | ORAL_TABLET | Freq: Every day | ORAL | Status: DC

## 2021-03-20 MED ORDER — ASPIRIN EC 81 MG PO TBEC
81.0000 mg | DELAYED_RELEASE_TABLET | Freq: Every day | ORAL | Status: DC
  Administered 2021-03-21 – 2021-03-26 (×6): 81 mg via ORAL
  Filled 2021-03-20 (×7): qty 1

## 2021-03-20 MED ORDER — PHENYLEPHRINE 40 MCG/ML (10ML) SYRINGE FOR IV PUSH (FOR BLOOD PRESSURE SUPPORT)
PREFILLED_SYRINGE | INTRAVENOUS | Status: AC
  Filled 2021-03-20: qty 10

## 2021-03-20 MED ORDER — VANCOMYCIN HCL 1250 MG/250ML IV SOLN
1250.0000 mg | INTRAVENOUS | Status: AC
  Administered 2021-03-20: 1250 mg via INTRAVENOUS
  Filled 2021-03-20: qty 250

## 2021-03-20 MED ORDER — SODIUM CHLORIDE 0.9 % IV SOLN
INTRAVENOUS | Status: DC | PRN
  Administered 2021-03-20: 1000 mL via INTRAVENOUS
  Administered 2021-03-21 – 2021-03-26 (×2): 250 mL via INTRAVENOUS

## 2021-03-20 MED ORDER — BISOPROLOL FUMARATE 5 MG PO TABS
10.0000 mg | ORAL_TABLET | Freq: Every day | ORAL | Status: DC
  Administered 2021-03-21 – 2021-03-26 (×6): 10 mg via ORAL
  Filled 2021-03-20 (×4): qty 2
  Filled 2021-03-20: qty 1
  Filled 2021-03-20 (×3): qty 2

## 2021-03-20 MED ORDER — FLUTICASONE-UMECLIDIN-VILANT 100-62.5-25 MCG/INH IN AEPB: 1.0000 | INHALATION_SPRAY | Freq: Every day | RESPIRATORY_TRACT | Status: DC

## 2021-03-20 MED ORDER — LACTATED RINGERS IV BOLUS
1000.0000 mL | Freq: Once | INTRAVENOUS | Status: AC
  Administered 2021-03-20: 1000 mL via INTRAVENOUS

## 2021-03-20 MED ORDER — UMECLIDINIUM BROMIDE 62.5 MCG/INH IN AEPB
1.0000 | INHALATION_SPRAY | Freq: Every day | RESPIRATORY_TRACT | Status: DC
  Filled 2021-03-20: qty 7

## 2021-03-20 MED ORDER — TRAZODONE HCL 50 MG PO TABS
25.0000 mg | ORAL_TABLET | Freq: Every evening | ORAL | Status: DC | PRN
  Administered 2021-03-24 – 2021-03-25 (×2): 25 mg via ORAL
  Filled 2021-03-20 (×2): qty 1

## 2021-03-20 MED ORDER — NICOTINE 7 MG/24HR TD PT24
7.0000 mg | MEDICATED_PATCH | Freq: Every day | TRANSDERMAL | Status: DC
  Administered 2021-03-21: 7 mg via TRANSDERMAL
  Filled 2021-03-20 (×2): qty 1

## 2021-03-20 MED ORDER — CHLORHEXIDINE GLUCONATE 0.12 % MT SOLN
15.0000 mL | Freq: Two times a day (BID) | OROMUCOSAL | Status: DC
  Administered 2021-03-21 – 2021-03-26 (×10): 15 mL via OROMUCOSAL
  Filled 2021-03-20 (×12): qty 15

## 2021-03-20 MED ORDER — SODIUM CHLORIDE 0.9 % IV SOLN: INTRAVENOUS | Status: DC

## 2021-03-20 MED ORDER — ORAL CARE MOUTH RINSE
15.0000 mL | Freq: Two times a day (BID) | OROMUCOSAL | Status: DC
  Administered 2021-03-21 – 2021-03-24 (×7): 15 mL via OROMUCOSAL

## 2021-03-20 MED ORDER — CEFTRIAXONE SODIUM 1 G IJ SOLR: 1.0000 g | INTRAMUSCULAR | Status: DC

## 2021-03-20 MED ORDER — ALBUTEROL SULFATE (2.5 MG/3ML) 0.083% IN NEBU
2.5000 mg | INHALATION_SOLUTION | Freq: Four times a day (QID) | RESPIRATORY_TRACT | Status: DC | PRN
  Administered 2021-03-21 – 2021-03-25 (×3): 2.5 mg via RESPIRATORY_TRACT
  Filled 2021-03-20 (×4): qty 3

## 2021-03-20 MED ORDER — ONDANSETRON HCL 4 MG/2ML IJ SOLN
4.0000 mg | Freq: Four times a day (QID) | INTRAMUSCULAR | Status: DC | PRN
  Administered 2021-03-24: 4 mg via INTRAVENOUS
  Filled 2021-03-20: qty 2

## 2021-03-20 MED ORDER — SODIUM CHLORIDE 0.9 % IV SOLN
2.0000 g | INTRAVENOUS | Status: AC
  Administered 2021-03-20: 2 g via INTRAVENOUS
  Filled 2021-03-20: qty 2

## 2021-03-20 MED ORDER — FUROSEMIDE 40 MG PO TABS: 20.0000 mg | ORAL_TABLET | Freq: Every day | ORAL | Status: DC

## 2021-03-20 MED ORDER — CHLORHEXIDINE GLUCONATE CLOTH 2 % EX PADS
6.0000 | MEDICATED_PAD | Freq: Every day | CUTANEOUS | Status: DC
  Administered 2021-03-21 – 2021-03-25 (×3): 6 via TOPICAL

## 2021-03-20 MED ORDER — IPRATROPIUM-ALBUTEROL 0.5-2.5 (3) MG/3ML IN SOLN
3.0000 mL | Freq: Four times a day (QID) | RESPIRATORY_TRACT | Status: DC
  Administered 2021-03-21 – 2021-03-24 (×16): 3 mL via RESPIRATORY_TRACT
  Filled 2021-03-20 (×16): qty 3

## 2021-03-20 MED ORDER — SODIUM CHLORIDE 0.9 % IV SOLN: 250.0000 mL | INTRAVENOUS | Status: DC

## 2021-03-20 MED ORDER — FLUTICASONE FUROATE-VILANTEROL 100-25 MCG/INH IN AEPB
1.0000 | INHALATION_SPRAY | Freq: Every day | RESPIRATORY_TRACT | Status: DC
  Filled 2021-03-20: qty 28

## 2021-03-20 MED ORDER — ALBUTEROL SULFATE HFA 108 (90 BASE) MCG/ACT IN AERS: 1.0000 | INHALATION_SPRAY | Freq: Four times a day (QID) | RESPIRATORY_TRACT | Status: DC | PRN

## 2021-03-20 MED ORDER — PHENYLEPHRINE HCL-NACL 20-0.9 MG/250ML-% IV SOLN
25.0000 ug/min | INTRAVENOUS | Status: DC
  Filled 2021-03-20: qty 250

## 2021-03-20 MED ORDER — ATORVASTATIN CALCIUM 40 MG PO TABS
40.0000 mg | ORAL_TABLET | Freq: Every evening | ORAL | Status: DC
  Administered 2021-03-21 – 2021-03-25 (×5): 40 mg via ORAL
  Filled 2021-03-20 (×5): qty 1

## 2021-03-20 NOTE — Progress Notes (Signed)
A consult was received from an ED physician for Vancomycin and Cefepime per pharmacy dosing.  The patient's profile has been reviewed for ht/wt/allergies/indication/available labs.    A one time order has been placed for Vancomycin '1250mg'$  IV and Cefepime 2g IV.  Further antibiotics/pharmacy consults should be ordered by admitting physician if indicated.                       Thank you, Luiz Ochoa 03/20/2021  6:15 PM

## 2021-03-20 NOTE — ED Notes (Signed)
Respiratory called to transport patient upstairs.

## 2021-03-20 NOTE — ED Provider Notes (Addendum)
[facility] DEPT Provider Note   CSN: [id number] Arrival date & time: 03/20/21  1523     History Chief Complaint  Patient presents with   Shortness of Breath    Gerald Nelson is a 70 y.o. male with PMH CHF EF 15% in 2018, recent echo with an EF of 55%, CKD 3, COPD, pulmonary hypertension who presents the emergency department for evaluation of shortness of breath.  Patient 92% on room air by EMS and patient was treated with 2 DuoNeb's, 125 mg Solu-Medrol, 2 g of magnesium and a 500 cc normal saline bolus prior to arrival.  On arrival, patient has increased respiratory effort with accessory muscle use, tachypnea but no wheezes on exam.  Patient does not appear clinically fluid overloaded with no lower extremity edema.  Patient arrives hypotensive with systolics in the mid Q000111Q, denies chest pain, abdominal pain, nausea, vomiting, fever or any other systemic symptoms.  Endorses shortness of breath for approximately 3 days.   Shortness of Breath Associated symptoms: no abdominal pain, no chest pain, no cough, no ear pain, no fever, no rash, no sore throat and no vomiting       Past Medical History:  Diagnosis Date   Apical mural thrombus    a. question of apical thrombus on echo 01/2017, pt left AMA, not felt to be anticoag candidate with noncompliance.   Arthritis    CHF (congestive heart failure) (HCC)    Chronic systolic CHF (congestive heart failure) ([place])    a. pt refused cath. EF 15% 01/2017.   CKD (chronic kidney disease), stage III (HCC)    COPD (chronic obstructive pulmonary disease) (HCC)    Hernia, hiatal    Hyperlipidemia    Hypertension    Hyponatremia    Mitral regurgitation    a. mod-severe by echo 01/2017.   Pulmonary hypertension (HCC)    Schizophrenia ([place])    Tobacco abuse     Patient Active Problem List   Diagnosis Date Noted   COPD exacerbation ([place]) 05/01/2020   Stage 3 severe COPD by GOLD classification ([place]) 11/14/2018    Noncompliance 04/23/2017   Mitral regurgitation 04/23/2017   Thrombus - possible apical thrombus 01/2017 04/23/2017   Hyperlipidemia 04/23/2017   Pulmonary hypertension (HCC)    SOB (shortness of breath)    Palliative care by specialist    Tobacco abuse 04/19/2017   Acute respiratory failure with hypoxia ([place]) 01/27/2017   Hypertensive urgency 01/27/2017   CHF (congestive heart failure) ([place]) 01/26/2017   CKD (chronic kidney disease), stage III ([place]) 01/04/2017   Essential hypertension 01/04/2017   CVA (cerebral infarction) 12/12/2013   Hyponatremia 12/06/2012   Hiatal hernia 12/06/2012   Syncope 12/06/2012   Schizophrenia ([place])     Past Surgical History:  Procedure Laterality Date   PENILE PROSTHESIS IMPLANT         Family History  Problem Relation Age of Onset   Hypertension Mother    Hypertension Father     Social History   Tobacco Use   Smoking status: Every Day    Packs/day: 1.50    Years: 47.00    Pack years: 70.50    Types: Cigarettes    Start date: 1974   Smokeless tobacco: Never   Tobacco comments:    requests patch  Vaping Use   Vaping Use: Never used  Substance Use Topics   Alcohol use: No   Drug use: No    Home Medications Prior to Admission medications  Medication Sig Start Date End Date Taking? Authorizing Provider  albuterol (PROVENTIL HFA;VENTOLIN HFA) 108 (90 Base) MCG/ACT inhaler Inhale 1-2 puffs into the lungs every 6 (six) hours as needed for wheezing or shortness of breath.    [provider]  ASPIRIN LOW DOSE 81 MG EC tablet TAKE ONE TABLET BY MOUTH ONCE DAILY (MORNING) 02/20/21   [name], MD  atorvastatin (LIPITOR) 40 MG tablet TAKE ONE TABLET BY MOUTH EVERY DAY IN THE EVENING 02/20/21   [name], MD  bisoprolol (ZEBETA) 10 MG tablet TAKE 1 TABLET (10 MG TOTAL) BY MOUTH DAILY. (MORNING) 10/24/20   [name], MD  Fluticasone-Umeclidin-Vilant (TRELEGY ELLIPTA) 100-62.5-25 MCG/INH AEPB Inhale 1 puff into the  lungs daily. 03/21/19   Martyn Ehrich, NP  furosemide (LASIX) 20 MG tablet TAKE ONE TABLET BY MOUTH ONCE DAILY(MORNING) 02/20/21   [name], MD  haloperidol (HALDOL) 5 MG tablet Take 5 mg by mouth 2 (two) times daily. 04/29/20   [provider]  haloperidol decanoate (HALDOL DECANOATE) 100 MG/ML injection Inject 100 mg into the muscle every 28 (twenty-eight) days.  08/07/18   [provider]  hydrOXYzine (VISTARIL) 25 MG capsule Take 25 mg by mouth 2 (two) times daily. 04/29/20   [provider]  isosorbide-hydrALAZINE (BIDIL) 20-37.5 MG tablet TAKE 1 TABLET BY MOUTH 3 (THREE) TIMES DAILY (AM+NOON+BEDTIME) 12/29/20   [name], MD  losartan (COZAAR) 25 MG tablet TAKE ONE TABLET BY MOUTH TWICE DAILY (AM+BEDTIME) 07/22/20   [name], MD  potassium chloride SA (KLOR-CON) 20 MEQ tablet TAKE ONE TABLET BY MOUTH ONCE DAILY (AM) 11/26/20   Bensimhon, Shaune Pascal, MD  spironolactone (ALDACTONE) 25 MG tablet TAKE 1 TABLET (25 MG TOTAL) BY MOUTH AT BEDTIME. 11/26/20   Bensimhon, Shaune Pascal, MD    Allergies    Sulfa antibiotics  Review of Systems   Review of Systems  Constitutional:  Negative for chills and fever.  HENT:  Negative for ear pain and sore throat.   Eyes:  Negative for pain and visual disturbance.  Respiratory:  Positive for shortness of breath. Negative for cough.   Cardiovascular:  Negative for chest pain and palpitations.  Gastrointestinal:  Negative for abdominal pain and vomiting.  Genitourinary:  Negative for dysuria and hematuria.  Musculoskeletal:  Negative for arthralgias and back pain.  Skin:  Negative for color change and rash.  Neurological:  Negative for seizures and syncope.  All other systems reviewed and are negative.  Physical Exam Updated Vital Signs BP 97/68 (BP Location: Right Arm)   Pulse 90   Temp 98.8 F (37.1 C) (Oral)   Resp (!) 32   Ht '5\' 10"'$  (1.778 m)   Wt 60.3 kg   SpO2 97%   BMI 19.08 kg/m   Physical  Exam Vitals and nursing note reviewed.  Constitutional:      General: He is in acute distress.     Appearance: He is well-developed. He is ill-appearing.  HENT:     Head: Normocephalic and atraumatic.  Eyes:     Conjunctiva/sclera: Conjunctivae normal.  Cardiovascular:     Rate and Rhythm: Normal rate and regular rhythm.     Heart sounds: No murmur heard. Pulmonary:     Effort: Tachypnea, accessory muscle usage and respiratory distress present.     Breath sounds: Normal breath sounds.  Abdominal:     Palpations: Abdomen is soft.     Tenderness: There is no abdominal tenderness.  Musculoskeletal:  Cervical back: Neck supple.  Skin:    General: Skin is warm and dry.  Neurological:     Mental Status: He is alert.    ED Results / Procedures / Treatments   Labs (all labs ordered are listed, but only abnormal results are displayed) Labs Reviewed - No data to display  EKG None  Radiology No results found.  Procedures Procedures   Medications Ordered in ED Medications - No data to display  ED Course  I have reviewed the triage vital signs and the nursing notes.  Pertinent labs & imaging results that were available during my care of the patient were reviewed by me and considered in my medical decision making (see chart for details).    MDM Rules/Calculators/A&P                           Patient seen and patient seen emergency department for evaluation of shortness of breath.  Physical exam reveals an ill-appearing patient with tachypnea and accessory muscle use but no wheezes heard on exam.  Patient arrives initially hypotensive with initial systolics in the mid Q000111Q which improved after 1 L lactated Ringer's.  Patient placed on maintenance fluids afterwards.  Initial i-STAT with a creatinine of 4.50, BUN 88 which is a significant elevation for this patient, but no evidence of acute hyperkalemia.  Initial ECG with what appears to be hyperacute T waves in the anterior  leads, but ST morphology similar to previous ECGs in the setting of an incomplete left bundle branch block.  We will obtain repeat ECGs to ensure patient not experiencing developing ischemic event.  Patient initially placed on BiPAP for respiratory effort and respiratory effort improved significantly.  CBC with a significant leukocytosis to 23.4 which may be elevated in the setting of methylprednisolone given earlier today but cannot rule out underlying infection.  Anemia to 11.2, initial high-sensitivity troponin 25, delta troponin pending.  X-ray with cardiomegaly but no evidence of fluid overload or consolidative process.  Lactate 1.7.  In the setting of initial hypotension and dyspnea with a significant leukocytosis, broad-spectrum antibiotics initiated.  Patient will require stepdown admission for shortness of breath on BiPAP.   CRITICAL CARE Performed by: Teressa Lower   Total critical care time: 30 minutes  Critical care time was exclusive of separately billable procedures and treating other patients.  Critical care was necessary to treat or prevent imminent or life-threatening deterioration.  Critical care was time spent personally by me on the following activities: development of treatment plan with patient and/or surrogate as well as nursing, discussions with consultants, evaluation of patient's response to treatment, examination of patient, obtaining history from patient or surrogate, ordering and performing treatments and interventions, ordering and review of laboratory studies, ordering and review of radiographic studies, pulse oximetry and re-evaluation of patient's condition.  Final Clinical Impression(s) / ED Diagnoses Final diagnoses:  None    Rx / DC Orders ED Discharge Orders     None        Raoul Ciano, Debe Coder, MD 03/20/21 1753    Teressa Lower, MD 03/20/21 1753

## 2021-03-20 NOTE — H&P (Signed)
History and Physical    Gerald Nelson [id number] DOB: 05/24/1951 DOA: 03/20/2021  PCP: [name], MD  Patient coming from: Home  I have personally briefly reviewed patient's old medical records in [place]  Chief Complaint: Altered mental status  HPI: Gerald Nelson is a 70 y.o. male with medical history significant of COPD, prior CVA, HTN, CHF, CKD 3 significant mitral regurg who was brought in by EMS with a presumed COPD exacerbation was initially hypoxic and hypotensive.  Patient is unable to give a very good history of exactly what has been going on with prior to coming to the hospital. (ED Course: Patient was given 2 duo nebs, 105 mg of Solu-Medrol, 2 g of magnesium and a 500 cc bolus prior to admission to the ED.  On reevaluation increased work of breathing tachypnea but no wheezing.  Systolic blood pressures were in the 70s.  He was initially placed on BiPAP and given IV fluid resuscitation which resulted in a rebound of his blood pressures.  Sepsis protocol was begun and a lactic acid was noted to be 1.7.  Patient had a negative chest x-ray and was noted to have elevated white count of 23.4.  Additionally his creatinine was 4.5 from a baseline of about 1.68.  BNP was slightly elevated at 193.2 and a troponin also mildly elevated at 25.  His D-dimer was 6.05.  Review of Systems: As per HPI otherwise 10 point review of systems negative.   Past Medical History:  Diagnosis Date   Apical mural thrombus    a. question of apical thrombus on echo 01/2017, pt left AMA, not felt to be anticoag candidate with noncompliance.   Arthritis    CHF (congestive heart failure) (HCC)    Chronic systolic CHF (congestive heart failure) ([place])    a. pt refused cath. EF 15% 01/2017.   CKD (chronic kidney disease), stage III (HCC)    COPD (chronic obstructive pulmonary disease) (HCC)    Hernia, hiatal    Hyperlipidemia    Hypertension    Hyponatremia    Mitral regurgitation    a.  mod-severe by echo 01/2017.   Pulmonary hypertension (HCC)    Schizophrenia ([place])    Tobacco abuse     Past Surgical History:  Procedure Laterality Date   PENILE PROSTHESIS IMPLANT       reports that he has been smoking cigarettes. He started smoking about 48 years ago. He has a 70.50 pack-year smoking history. He has never used smokeless tobacco. He reports that he does not drink alcohol and does not use drugs.  Allergies  Allergen Reactions   Sulfa Antibiotics Nausea Only    Family History  Problem Relation Age of Onset   Hypertension Mother    Hypertension Father    Patient lives at home with 3 other friends.  Prior to Admission medications   Medication Sig Start Date End Date Taking? Authorizing Provider  albuterol (PROVENTIL HFA;VENTOLIN HFA) 108 (90 Base) MCG/ACT inhaler Inhale 1-2 puffs into the lungs every 6 (six) hours as needed for wheezing or shortness of breath.    [provider]  ASPIRIN LOW DOSE 81 MG EC tablet TAKE ONE TABLET BY MOUTH ONCE DAILY (MORNING) 02/20/21   [name], MD  atorvastatin (LIPITOR) 40 MG tablet TAKE ONE TABLET BY MOUTH EVERY DAY IN THE EVENING 02/20/21   [name], MD  bisoprolol (ZEBETA) 10 MG tablet TAKE 1 TABLET (10 MG TOTAL) BY MOUTH DAILY. (MORNING) 10/24/20  [name], MD  Fluticasone-Umeclidin-Vilant (TRELEGY ELLIPTA) 100-62.5-25 MCG/INH AEPB Inhale 1 puff into the lungs daily. 03/21/19   Martyn Ehrich, NP  furosemide (LASIX) 20 MG tablet TAKE ONE TABLET BY MOUTH ONCE DAILY(MORNING) 02/20/21   [name], MD  haloperidol (HALDOL) 5 MG tablet Take 5 mg by mouth 2 (two) times daily. 04/29/20   [provider]  haloperidol decanoate (HALDOL DECANOATE) 100 MG/ML injection Inject 100 mg into the muscle every 28 (twenty-eight) days.  08/07/18   [provider]  hydrOXYzine (VISTARIL) 25 MG capsule Take 25 mg by mouth 2 (two) times daily. 04/29/20   [provider]   isosorbide-hydrALAZINE (BIDIL) 20-37.5 MG tablet TAKE 1 TABLET BY MOUTH 3 (THREE) TIMES DAILY (AM+NOON+BEDTIME) 12/29/20   [name], MD  losartan (COZAAR) 25 MG tablet TAKE ONE TABLET BY MOUTH TWICE DAILY (AM+BEDTIME) 07/22/20   [name], MD  potassium chloride SA (KLOR-CON) 20 MEQ tablet TAKE ONE TABLET BY MOUTH ONCE DAILY (AM) 11/26/20   Bensimhon, Shaune Pascal, MD  spironolactone (ALDACTONE) 25 MG tablet TAKE 1 TABLET (25 MG TOTAL) BY MOUTH AT BEDTIME. 11/26/20   Bensimhon, Shaune Pascal, MD    Physical Exam: Vitals:   03/20/21 1551 03/20/21 1600 03/20/21 1751  BP: 97/68    Pulse: 90    Resp: (!) 32    Temp: 98.8 F (37.1 C)    TempSrc: Oral    SpO2: 97%  98%  Weight:  60.3 kg   Height:  '5\' 10"'$  (1.778 m)     Constitutional: NAD, calm,  increased work of breathing Eyes: PERRL, lids and conjunctivae normal ENMT: Mucous membranes are moist. Posterior pharynx clear of any exudate or lesions.poor dentition.  Neck: normal, supple, no masses, no thyromegaly Respiratory: clear to auscultation bilaterally, no wheezing, no crackles.  Increased respiratory effort. Accessory muscle use.  Cardiovascular: Regular rate and rhythm, no murmurs / rubs / gallops. No extremity edema. 2+ pedal pulses.  Abdomen: no tenderness, no masses palpated. No hepatosplenomegaly. Bowel sounds positive.  Musculoskeletal: no clubbing / cyanosis. No joint deformity upper and lower extremities. Good ROM, no contractures. Normal muscle tone.  Skin: no rashes, lesions, ulcers. No induration Neurologic: Grossly normal Psychiatric: Normal judgment and insight. Alert and oriented x 3. Normal mood.   Labs on Admission: I have personally reviewed following labs and imaging studies  CBC: Recent Labs  Lab 03/20/21 1628 03/20/21 1717  WBC 23.4*  --   NEUTROABS 19.8*  --   HGB 11.2* 10.9*  HCT 32.1* 32.0*  MCV 85.6  --   PLT 111*  --    Basic Metabolic Panel: Recent Labs  Lab 03/20/21 1628 03/20/21 1717   NA 137 136  K 4.4 4.6  CL 102 105  CO2 22  --   GLUCOSE 118* 109*  BUN 95* 88*  CREATININE 4.38* 4.50*  CALCIUM 8.8*  --    GFR: Estimated Creatinine Clearance: 13.4 mL/min (A) (by C-G formula based on SCr of 4.5 mg/dL (H)). Liver Function Tests: Recent Labs  Lab 03/20/21 1628  AST 29  ALT 23  ALKPHOS 76  BILITOT 1.7*  PROT 6.8  ALBUMIN 2.9*   Urine analysis:    Component Value Date/Time   COLORURINE YELLOW 12/11/2013 [place] 12/11/2013 1733   LABSPEC 1.014 12/11/2013 1733   PHURINE 5.5 12/11/2013 [place] 12/11/2013 1733   HGBUR NEGATIVE 12/11/2013 [place] NEGATIVE 12/11/2013 1733   KETONESUR  NEGATIVE 12/11/2013 1733   PROTEINUR NEGATIVE 12/11/2013 1733   UROBILINOGEN 1.0 12/11/2013 1733   NITRITE NEGATIVE 12/11/2013 1733   LEUKOCYTESUR NEGATIVE 12/11/2013 1733    Radiological Exams on Admission: DG Chest Portable 1 View  Result Date: 03/20/2021 CLINICAL DATA:  Dyspnea with COPD exacerbation EXAM: PORTABLE CHEST 1 VIEW COMPARISON:  10/08/2020 FINDINGS: Cardiomegaly. Both lungs are clear. The visualized skeletal structures are unremarkable. IMPRESSION: Cardiomegaly without acute abnormality of the lungs in AP portable projection. Electronically Signed   By: Eddie Candle M.D.   On: 03/20/2021 17:42    EKG: Independently reviewed.  Sinus rhythm, PVCs, incomplete left bundle branch block  Assessment/Plan Active Problems:   Schizophrenia (HCC)   Cerebral infarction (HCC)   CKD (chronic kidney disease), stage III (HCC)   Essential hypertension   CHF (congestive heart failure) (HCC)   Acute respiratory failure with hypoxia (HCC)   Tobacco abuse   Mitral regurgitation   Hyperlipidemia   Pulmonary hypertension (HCC)   Stage 3 severe COPD by GOLD classification ([place])   COPD exacerbation (HCC)   Acute renal failure (ARF) (HCC)  Acute respiratory failure with hypoxia secondary to COPD exacerbation-not on home O2   Continue BiPAP Rocephin Continue Trelegy Duo nebs Negative chest x-ray Respiratory panel Steroid burst  Hypotension Responded in the ED to fluid we will continue this Neo-Synephrine as needed  Acute renal insufficiency on chronic renal failure stage IIIc Baseline creatinine is 1.6 BUN is 88 Gentle IV fluid hydration Avoid nephrotoxic agents  Ongoing tobacco use Nicotine patch  Pulmonary hypertension  Hyperlipidemia Continue Lipitor  History of mitral regurg Monitor  History of CHF Previous EF down to 15% but most recently had recovered to 50 to 55%  No evidence of fluid overload today Holding Lasix  History of CVA Continue aspirin  History of schizophrenia Continue Haldol  Hypertension In light of hypotension will hold meds  DVT prophylaxis: Heparin SQ Code Status: Full code  Family Communication: Patient at bedside Disposition Plan: Home Consults called: None Admission status: Inpatient patient is inpatient status due to ongoing respiratory needs, continued work-up of kidney failure, oxygen needs.   Donnamae Jude MD [facility]  If 7PM-7AM, please contact night-coverage 03/20/2021, 6:38 PM

## 2021-03-20 NOTE — ED Triage Notes (Signed)
Arrives via EMS from home, C/C COPD exacerbation. Family called out stating pt has had SOB today. 2 Duo-nebs given w/ EMS, 92% RA. 125 mg Solumedrol, 2 grams mag IV, 500 cc NS.    BP 90s  20 G LFA

## 2021-03-20 NOTE — Progress Notes (Signed)
Hypotensive , discussed with admitting provider : Order obtained

## 2021-03-21 ENCOUNTER — Encounter (HOSPITAL_COMMUNITY): Payer: Self-pay | Admitting: Family Medicine

## 2021-03-21 ENCOUNTER — Inpatient Hospital Stay (HOSPITAL_COMMUNITY): Payer: Medicare Other

## 2021-03-21 ENCOUNTER — Other Ambulatory Visit: Payer: Self-pay

## 2021-03-21 DIAGNOSIS — N1832 Chronic kidney disease, stage 3b: Secondary | ICD-10-CM | POA: Diagnosis not present

## 2021-03-21 DIAGNOSIS — M7989 Other specified soft tissue disorders: Secondary | ICD-10-CM

## 2021-03-21 DIAGNOSIS — I959 Hypotension, unspecified: Secondary | ICD-10-CM

## 2021-03-21 DIAGNOSIS — I10 Essential (primary) hypertension: Secondary | ICD-10-CM | POA: Diagnosis not present

## 2021-03-21 DIAGNOSIS — F209 Schizophrenia, unspecified: Secondary | ICD-10-CM

## 2021-03-21 DIAGNOSIS — D72829 Elevated white blood cell count, unspecified: Secondary | ICD-10-CM

## 2021-03-21 DIAGNOSIS — Z720 Tobacco use: Secondary | ICD-10-CM

## 2021-03-21 DIAGNOSIS — I340 Nonrheumatic mitral (valve) insufficiency: Secondary | ICD-10-CM

## 2021-03-21 DIAGNOSIS — J441 Chronic obstructive pulmonary disease with (acute) exacerbation: Secondary | ICD-10-CM

## 2021-03-21 DIAGNOSIS — E785 Hyperlipidemia, unspecified: Secondary | ICD-10-CM

## 2021-03-21 DIAGNOSIS — N179 Acute kidney failure, unspecified: Principal | ICD-10-CM

## 2021-03-21 LAB — CBC
HCT: 30.5 % — ABNORMAL LOW (ref 39.0–52.0)
Hemoglobin: 10.7 g/dL — ABNORMAL LOW (ref 13.0–17.0)
MCH: 29.6 pg (ref 26.0–34.0)
MCHC: 35.1 g/dL (ref 30.0–36.0)
MCV: 84.5 fL (ref 80.0–100.0)
Platelets: 99 10*3/uL — ABNORMAL LOW (ref 150–400)
RBC: 3.61 MIL/uL — ABNORMAL LOW (ref 4.22–5.81)
RDW: 13.4 % (ref 11.5–15.5)
WBC: 19.9 10*3/uL — ABNORMAL HIGH (ref 4.0–10.5)
nRBC: 0 % (ref 0.0–0.2)

## 2021-03-21 LAB — COMPREHENSIVE METABOLIC PANEL
ALT: 22 U/L (ref 0–44)
AST: 27 U/L (ref 15–41)
Albumin: 2.8 g/dL — ABNORMAL LOW (ref 3.5–5.0)
Alkaline Phosphatase: 59 U/L (ref 38–126)
Anion gap: 16 — ABNORMAL HIGH (ref 5–15)
BUN: 84 mg/dL — ABNORMAL HIGH (ref 8–23)
CO2: 16 mmol/L — ABNORMAL LOW (ref 22–32)
Calcium: 8.7 mg/dL — ABNORMAL LOW (ref 8.9–10.3)
Chloride: 107 mmol/L (ref 98–111)
Creatinine, Ser: 3.64 mg/dL — ABNORMAL HIGH (ref 0.61–1.24)
GFR, Estimated: 17 mL/min — ABNORMAL LOW (ref >60)
Glucose, Bld: 120 mg/dL — ABNORMAL HIGH (ref 70–99)
Potassium: 4.7 mmol/L (ref 3.5–5.1)
Sodium: 139 mmol/L (ref 135–145)
Total Bilirubin: 1.2 mg/dL (ref 0.3–1.2)
Total Protein: 6.4 g/dL — ABNORMAL LOW (ref 6.5–8.1)

## 2021-03-21 LAB — BLOOD CULTURE ID PANEL (REFLEXED) - BCID2

## 2021-03-21 LAB — URINALYSIS, ROUTINE W REFLEX MICROSCOPIC
Bilirubin Urine: NEGATIVE
Glucose, UA: NEGATIVE mg/dL
Hgb urine dipstick: NEGATIVE
Ketones, ur: NEGATIVE mg/dL
Leukocytes,Ua: NEGATIVE
Nitrite: NEGATIVE
Protein, ur: NEGATIVE mg/dL
Specific Gravity, Urine: 1.011 (ref 1.005–1.030)
pH: 5 (ref 5.0–8.0)

## 2021-03-21 LAB — CREATININE, URINE, RANDOM: Creatinine, Urine: 70.29 mg/dL

## 2021-03-21 LAB — MRSA NEXT GEN BY PCR, NASAL: MRSA by PCR Next Gen: NOT DETECTED

## 2021-03-21 LAB — SODIUM, URINE, RANDOM: Sodium, Ur: 12 mmol/L

## 2021-03-21 MED ORDER — NICOTINE 21 MG/24HR TD PT24
21.0000 mg | MEDICATED_PATCH | Freq: Every day | TRANSDERMAL | Status: DC
  Administered 2021-03-21 – 2021-03-26 (×6): 21 mg via TRANSDERMAL
  Filled 2021-03-21 (×6): qty 1

## 2021-03-21 MED ORDER — SODIUM CHLORIDE 0.9 % IV SOLN
2.0000 g | INTRAVENOUS | Status: DC
  Administered 2021-03-21 – 2021-03-22 (×2): 2 g via INTRAVENOUS
  Filled 2021-03-21 (×2): qty 2

## 2021-03-21 MED ORDER — HEPARIN (PORCINE) 25000 UT/250ML-% IV SOLN
900.0000 [IU]/h | INTRAVENOUS | Status: DC
  Administered 2021-03-21: 900 [IU]/h via INTRAVENOUS
  Filled 2021-03-21: qty 250

## 2021-03-21 MED ORDER — NICOTINE 14 MG/24HR TD PT24
14.0000 mg | MEDICATED_PATCH | Freq: Every day | TRANSDERMAL | Status: DC
  Administered 2021-03-21: 14 mg via TRANSDERMAL
  Filled 2021-03-21 (×2): qty 1

## 2021-03-21 MED ORDER — FLUTICASONE PROPIONATE 50 MCG/ACT NA SUSP
2.0000 | Freq: Every day | NASAL | Status: DC
  Administered 2021-03-21 – 2021-03-26 (×6): 2 via NASAL
  Filled 2021-03-21: qty 16

## 2021-03-21 MED ORDER — SODIUM CHLORIDE 0.9 % IV SOLN
2.0000 g | INTRAVENOUS | Status: DC
  Administered 2021-03-21: 2 g via INTRAVENOUS
  Filled 2021-03-21: qty 2

## 2021-03-21 MED ORDER — SODIUM BICARBONATE 650 MG PO TABS
650.0000 mg | ORAL_TABLET | Freq: Two times a day (BID) | ORAL | Status: DC
  Administered 2021-03-21 – 2021-03-22 (×4): 650 mg via ORAL
  Filled 2021-03-21 (×4): qty 1

## 2021-03-21 MED ORDER — PANTOPRAZOLE SODIUM 40 MG PO TBEC
40.0000 mg | DELAYED_RELEASE_TABLET | Freq: Every day | ORAL | Status: DC
  Administered 2021-03-21 – 2021-03-26 (×5): 40 mg via ORAL
  Filled 2021-03-21 (×6): qty 1

## 2021-03-21 MED ORDER — TECHNETIUM TO 99M ALBUMIN AGGREGATED
4.3500 | Freq: Once | INTRAVENOUS | Status: AC | PRN
  Administered 2021-03-21: 4.35 via INTRAVENOUS

## 2021-03-21 MED ORDER — HEPARIN SODIUM (PORCINE) 5000 UNIT/ML IJ SOLN
5000.0000 [IU] | Freq: Three times a day (TID) | INTRAMUSCULAR | Status: DC
Start: 2021-03-21 — End: 2021-03-26
  Administered 2021-03-21 – 2021-03-26 (×14): 5000 [IU] via SUBCUTANEOUS
  Filled 2021-03-21 (×14): qty 1

## 2021-03-21 MED ORDER — HALOPERIDOL 5 MG PO TABS
5.0000 mg | ORAL_TABLET | Freq: Two times a day (BID) | ORAL | Status: DC
  Administered 2021-03-21 – 2021-03-26 (×11): 5 mg via ORAL
  Filled 2021-03-21 (×2): qty 5
  Filled 2021-03-21 (×3): qty 1
  Filled 2021-03-21: qty 5
  Filled 2021-03-21 (×5): qty 1

## 2021-03-21 MED ORDER — METHYLPREDNISOLONE SODIUM SUCC 125 MG IJ SOLR
120.0000 mg | Freq: Every day | INTRAMUSCULAR | Status: DC
  Administered 2021-03-21 – 2021-03-22 (×2): 120 mg via INTRAVENOUS
  Filled 2021-03-21 (×3): qty 2

## 2021-03-21 MED ORDER — LORATADINE 10 MG PO TABS
10.0000 mg | ORAL_TABLET | Freq: Every day | ORAL | Status: DC
  Administered 2021-03-21 – 2021-03-26 (×6): 10 mg via ORAL
  Filled 2021-03-21 (×7): qty 1

## 2021-03-21 MED ORDER — BUDESONIDE 0.5 MG/2ML IN SUSP
0.5000 mg | Freq: Two times a day (BID) | RESPIRATORY_TRACT | Status: DC
  Administered 2021-03-21 – 2021-03-26 (×11): 0.5 mg via RESPIRATORY_TRACT
  Filled 2021-03-21 (×10): qty 2

## 2021-03-21 NOTE — Progress Notes (Addendum)
PROGRESS NOTE    Gerald Nelson  [id number] DOB: Feb 19, 1951 DOA: 03/20/2021 PCP: [name], MD    Chief Complaint  Patient presents with   Shortness of Breath    Brief Narrative:  Patient is a 70 year old gentleman history of COPD with ongoing tobacco use, prior CVA, hypertension, CHF, CKD 3, history of mitral regurg presented to the ED with hypoxia, noted to be hypotensive, noted to have a leukocytosis.  Patient placed on empiric IV antibiotics, pancultured.  Patient placed on IV fluids which aided with hypotension.  And D-dimer noted to be elevated at approximately 6.  Patient being treated for COPD exacerbation.  VQ scan ordered and pending.  Lower extremity Dopplers negative for DVT.  Patient placed empirically on IV heparin pending VQ scan results.  IV antibiotic coverage broadened.   Assessment & Plan:   Active Problems:   Hypotension   Leukocytosis   Schizophrenia (HCC)   Cerebral infarction (HCC)   CKD (chronic kidney disease), stage III (HCC)   Essential hypertension   CHF (congestive heart failure) (HCC)   Acute respiratory failure with hypoxia (HCC)   Tobacco abuse   Mitral regurgitation   Hyperlipidemia   Pulmonary hypertension (HCC)   Stage 3 severe COPD by GOLD classification ([place])   COPD exacerbation ([place])   Acute renal failure (ARF) ([place])  #1 acute respiratory failure with hypoxia felt secondary to acute COPD exacerbation.  Not on home O2. -Patient admitted with hypoxia, shortness of breath, leukocytosis and noted to be hypotensive requiring BiPAP overnight.  Currently on nasal cannula and being treated for COPD exacerbation. -Patient with elevated D-dimer of 6.05 and noted to be hypotensive on admission. -Patient with some expiratory wheezing. -Improving clinically. -Lower extremity Dopplers obtained this morning negative for DVT. -VQ scan ordered and pending. -Continue scheduled duo nebs. -Discontinue Trelegy and placed on Pulmicort  nebs. -Discontinue oral prednisone and placed on Solu-Medrol 60 mg IV every 12 hours. -Placed on PPI, Claritin, Flonase.  2.  Acute renal failure on chronic kidney disease stage IIIb -Likely secondary to prerenal azotemia in the setting of diuretics, ARB, ARB, spironolactone.  Patient also noted to be hypotensive on admission. -Creatinine on admission as high as 4.50. -Baseline creatinine 1.6-1.9. -Check UA with cultures and sensitivities, check urine sodium, urine creatinine, renal ultrasound. -Place Foley catheter. -Continue to hold diuretics and ARB. -IV fluids. -Strict I's and O's, daily weights. -Follow.  3.  Hypotension -Likely secondary to volume depletion versus infectious etiology. -BP responding to IV fluids. -Check a VQ scan to rule out PE as patient noted with elevated D-dimer. -Broaden IV antibiotic coverage to cefepime. -Follow.  4.  Abnormal TSH -Check a free T4.  5.  Leukocytosis/concern for sepsis rule out -?  Etiology.  Patient with concern for sepsis as patient had presented with hypotension, altered mental status, acute respiratory failure with hypoxia, noted with a significant leukocytosis with white count of 23.4 on admission. -Blood cultures ordered and pending. -Check a UA with cultures and sensitivities. -Check a VQ scan to rule out PE. -Lower extremity Dopplers negative for DVT. -COVID-19 PCR negative. -Sputum gram stain and cultures pending. -Broaden IV antibiotic coverage from IV Rocephin to IV cefepime pending culture results. -Supportive care.  6.  Ongoing tobacco use -Change nicotine patch to 21 mg daily. -Tobacco cessation stressed to patient.  7.  Hyperlipidemia -Continue statin.  8.  History of mitral regurgitation -Monitor. -Outpatient follow-up with cardiology.  9.  Pulmonary hypertension  10.  History of CVA -  Continue aspirin for secondary stroke prophylaxis. -Statin.  11.  History of schizophrenia -Resume home regimen oral  Haldol.  12.  History of CHF -Patient noted to have had a EF as low as 15% but most recent 2D echo from 04/01/2020 with a EF of 55%,NWMA, moderate LVH, grade 1 DD, normal mitral valve structure, no mitral valve regurgitation, no evidence of mitral stenosis. -Hold spironolactone, Lasix, ARB due to presentation with hypotension and acute renal failure. -Continue home regimen Zebeta. -Outpatient follow-up with cardiology.  13.  Hypertension -Due to presentation with hypotension antihypertensive medications on hold except Zebeta. -On IV fluids. -Follow.   DVT prophylaxis: Heparin Code Status: Full Family Communication: Updated patient.  No family at bedside. Disposition:   Status is: Inpatient  Remains inpatient appropriate because:Inpatient level of care appropriate due to severity of illness  Dispo: The patient is from: Home              Anticipated d/c is to: TBD              Patient currently is not medically stable to d/c.   Difficult to place patient No       Consultants:  None  Procedures:  Lower extremity Dopplers 03/21/2021 Chest x-ray 03/20/2021 VQ scan pending Renal ultrasound 03/21/2021 pending    Antimicrobials: IV Rocephin 03/20/2021>>>> 03/21/2021 IV cefepime 03/21/2021   Subjective: Sleeping but arousable.  Alert.  Following commands.  Denies any chest pain.  Feels shortness of breath is improving.  No nausea or vomiting.  No abdominal pain.  Overall feeling better than he did on admission.  Not sure what brought him to the hospital.  Blood pressure responded to fluids.  Objective: Vitals:   03/21/21 0800 03/21/21 0806 03/21/21 0900 03/21/21 1000  BP: (!) 106/59  110/65 128/74  Pulse:    63  Resp: (!) '21  15 14  '$ Temp:      TempSrc:      SpO2: 98% 98% 95% 95%  Weight:      Height:        Intake/Output Summary (Last 24 hours) at 03/21/2021 1127 Last data filed at 03/21/2021 1000 Gross per 24 hour  Intake 4197.61 ml  Output 700 ml  Net 3497.61 ml    Filed Weights   03/20/21 1600 03/20/21 2145  Weight: 60.3 kg 54.5 kg    Examination:  General exam: NAD.  Dry mucous membranes. Respiratory system: Some diffuse coarse breath sounds, minimal to mild expiratory wheezing.  No crackles.  Fair air movement.  Speaking in full sentences.  Cardiovascular system: S1 & S2 heard, RRR. No JVD, murmurs, rubs, gallops or clicks. No pedal edema. Gastrointestinal system: Abdomen is nondistended, soft and nontender. No organomegaly or masses felt. Normal bowel sounds heard. Central nervous system: Alert and oriented. No focal neurological deficits. Extremities: Symmetric 5 x 5 power. Skin: No rashes, lesions or ulcers Psychiatry: Judgement and insight appear normal. Mood & affect appropriate.     Data Reviewed: I have personally reviewed following labs and imaging studies  CBC: Recent Labs  Lab 03/20/21 1628 03/20/21 1717 03/21/21 0252  WBC 23.4*  --  19.9*  NEUTROABS 19.8*  --   --   HGB 11.2* 10.9* 10.7*  HCT 32.1* 32.0* 30.5*  MCV 85.6  --  84.5  PLT 111*  --  99*    Basic Metabolic Panel: Recent Labs  Lab 03/20/21 1628 03/20/21 1717 03/21/21 0252  NA 137 136 139  K 4.4 4.6 4.7  CL  102 105 107  CO2 22  --  16*  GLUCOSE 118* 109* 120*  BUN 95* 88* 84*  CREATININE 4.38* 4.50* 3.64*  CALCIUM 8.8*  --  8.7*    GFR: Estimated Creatinine Clearance: 15 mL/min (A) (by C-G formula based on SCr of 3.64 mg/dL (H)).  Liver Function Tests: Recent Labs  Lab 03/20/21 1628 03/21/21 0252  AST 29 27  ALT 23 22  ALKPHOS 76 59  BILITOT 1.7* 1.2  PROT 6.8 6.4*  ALBUMIN 2.9* 2.8*    CBG: No results for input(s): GLUCAP in the last 168 hours.   Recent Results (from the past 240 hour(s))  Blood culture (routine x 2)     Status: None (Preliminary result)   Collection Time: 03/20/21  4:29 PM   Specimen: BLOOD RIGHT ARM  Result Value Ref Range Status   Specimen Description   Final    BLOOD RIGHT ARM Performed at [facility], [place] [address], [place], [place] [address]    Special Requests   Final    BOTTLES DRAWN AEROBIC AND ANAEROBIC Blood Culture adequate volume Performed at [place] [address], [place], [place] [address]    Culture  Setup Time   Final    GRAM NEGATIVE RODS ANAEROBIC BOTTLE ONLY Organism ID to follow Performed at [facility], [place] [address], [place], [place] [address]    Culture GRAM NEGATIVE RODS  Final   Report Status PENDING  Incomplete  Resp Panel by RT-PCR (Flu A&B, Covid) Nasopharyngeal Swab     Status: None   Collection Time: 03/20/21  7:28 PM   Specimen: Nasopharyngeal Swab; Nasopharyngeal(NP) swabs in vial transport medium  Result Value Ref Range Status   SARS Coronavirus 2 by RT PCR NEGATIVE NEGATIVE Final    Comment: (NOTE) SARS-CoV-2 target nucleic acids are NOT DETECTED.  The SARS-CoV-2 RNA is generally detectable in upper respiratory specimens during the acute phase of infection. The lowest concentration of SARS-CoV-2 viral copies this assay can detect is 138 copies/mL. A negative result does not preclude SARS-Cov-2 infection and should not be used as the sole basis for treatment or other patient management decisions. A negative result may occur with  improper specimen collection/handling, submission of specimen other than nasopharyngeal swab, presence of viral mutation(s) within the areas targeted by this assay, and inadequate number of viral copies(<138 copies/mL). A negative result must be combined with clinical observations, patient history, and epidemiological information. The expected result is Negative.  Fact Sheet for Patients:  EntrepreneurPulse.com.au  Fact Sheet for Healthcare Providers:  IncredibleEmployment.be  This test is no t yet approved or cleared by the [place] FDA and  has been authorized for detection and/or diagnosis of SARS-CoV-2 by FDA under an  Emergency Use Authorization (EUA). This EUA will remain  in effect (meaning this test can be used) for the duration of the COVID-19 declaration under Section 564(b)(1) of the Act, 21 U.S.C.section 360bbb-3(b)(1), unless the authorization is terminated  or revoked sooner.       Influenza A by PCR NEGATIVE NEGATIVE Final   Influenza B by PCR NEGATIVE NEGATIVE Final    Comment: (NOTE) The Xpert Xpress SARS-CoV-2/FLU/RSV plus assay is intended as an aid in the diagnosis of influenza from Nasopharyngeal swab specimens and should not be used as a sole basis for treatment. Nasal washings and aspirates are unacceptable for Xpert Xpress SARS-CoV-2/FLU/RSV testing.  Fact Sheet for Patients: EntrepreneurPulse.com.au  Fact Sheet for Healthcare Providers: IncredibleEmployment.be  This test is not yet approved or cleared by the [place] and has been authorized for detection and/or diagnosis of SARS-CoV-2 by FDA under an Emergency Use Authorization (EUA). This EUA will remain in effect (meaning this test can be used) for the duration of the COVID-19 declaration under Section 564(b)(1) of the Act, 21 U.S.C. section 360bbb-3(b)(1), unless the authorization is terminated or revoked.  Performed at [facility], [place] [address] [place], [place] [address]   MRSA Next Gen by PCR, Nasal     Status: None   Collection Time: 03/20/21  9:58 PM   Specimen: Nasal Mucosa; Nasal Swab  Result Value Ref Range Status   MRSA by PCR Next Gen NOT DETECTED NOT DETECTED Final    Comment: (NOTE) The GeneXpert MRSA Assay (FDA approved for NASAL specimens only), is one component of a comprehensive MRSA colonization surveillance program. It is not intended to diagnose MRSA infection nor to guide or monitor treatment for MRSA infections. Test performance is not FDA approved in patients less than 52 years old. Performed at [facility],  [place] [address], [place], [place] [address]          Radiology Studies: DG Chest Portable 1 View  Result Date: 03/20/2021 CLINICAL DATA:  Dyspnea with COPD exacerbation EXAM: PORTABLE CHEST 1 VIEW COMPARISON:  10/08/2020 FINDINGS: Cardiomegaly. Both lungs are clear. The visualized skeletal structures are unremarkable. IMPRESSION: Cardiomegaly without acute abnormality of the lungs in AP portable projection. Electronically Signed   By: Eddie Candle M.D.   On: 03/20/2021 17:42   VAS [place] LOWER EXTREMITY VENOUS (DVT)  Result Date: 03/21/2021  Lower Venous DVT Study Patient Name:  CRIAG FURNISH  Date of Exam:   03/21/2021 Medical Rec #: [id number]       Accession #:    [id number] Date of Birth: 02/06/1951        Patient Gender: M Patient Age:   58 years Exam Location:  [facility] Procedure:      VAS [place] LOWER EXTREMITY VENOUS (DVT) Referring Phys: Irine Seal --------------------------------------------------------------------------------  Indications: Swelling.  Comparison Study: no prior Performing Technologist: Archie Patten RVS  Examination Guidelines: A complete evaluation includes B-mode imaging, spectral Doppler, color Doppler, and power Doppler as needed of all accessible portions of each vessel. Bilateral testing is considered an integral part of a complete examination. Limited examinations for reoccurring indications may be performed as noted. The reflux portion of the exam is performed with the patient in reverse Trendelenburg.  +---------+---------------+---------+-----------+----------+--------------+ RIGHT    CompressibilityPhasicitySpontaneityPropertiesThrombus Aging +---------+---------------+---------+-----------+----------+--------------+ CFV      Full           Yes      Yes                                 +---------+---------------+---------+-----------+----------+--------------+ SFJ      Full                                                         +---------+---------------+---------+-----------+----------+--------------+ FV Prox  Full                                                        +---------+---------------+---------+-----------+----------+--------------+  FV Mid   Full                                                        +---------+---------------+---------+-----------+----------+--------------+ FV DistalFull                                                        +---------+---------------+---------+-----------+----------+--------------+ PFV      Full                                                        +---------+---------------+---------+-----------+----------+--------------+ POP      Full           Yes      Yes                                 +---------+---------------+---------+-----------+----------+--------------+ PTV      Full                                                        +---------+---------------+---------+-----------+----------+--------------+ PERO     Full                                                        +---------+---------------+---------+-----------+----------+--------------+   +---------+---------------+---------+-----------+----------+--------------+ LEFT     CompressibilityPhasicitySpontaneityPropertiesThrombus Aging +---------+---------------+---------+-----------+----------+--------------+ CFV      Full           Yes      Yes                                 +---------+---------------+---------+-----------+----------+--------------+ SFJ      Full                                                        +---------+---------------+---------+-----------+----------+--------------+ FV Prox  Full                                                        +---------+---------------+---------+-----------+----------+--------------+ FV Mid   Full                                                         +---------+---------------+---------+-----------+----------+--------------+  FV DistalFull                                                        +---------+---------------+---------+-----------+----------+--------------+ PFV      Full                                                        +---------+---------------+---------+-----------+----------+--------------+ POP      Full           Yes      Yes                                 +---------+---------------+---------+-----------+----------+--------------+ PTV      Full                                                        +---------+---------------+---------+-----------+----------+--------------+ PERO     Full                                                        +---------+---------------+---------+-----------+----------+--------------+     Summary: BILATERAL: - No evidence of deep vein thrombosis seen in the lower extremities, bilaterally. -No evidence of popliteal cyst, bilaterally.   *See table(s) above for measurements and observations.    Preliminary         Scheduled Meds:  aspirin EC  81 mg Oral Daily   atorvastatin  40 mg Oral QPM   bisoprolol  10 mg Oral Daily   budesonide (PULMICORT) nebulizer solution  0.5 mg Nebulization BID   chlorhexidine  15 mL Mouth Rinse BID   Chlorhexidine Gluconate Cloth  6 each Topical Q0600   fluticasone  2 spray Each Nare Daily   haloperidol  5 mg Oral BID   ipratropium-albuterol  3 mL Nebulization Q6H   loratadine  10 mg Oral Daily   mouth rinse  15 mL Mouth Rinse q12n4p   methylPREDNISolone (SOLU-MEDROL) injection  120 mg Intravenous Daily   nicotine  14 mg Transdermal Daily   pantoprazole  40 mg Oral Q0600   phenylephrine  80-200 mcg Intravenous Once   sodium bicarbonate  650 mg Oral BID   Continuous Infusions:  sodium chloride 1,000 mL (03/20/21 1906)   sodium chloride 125 mL/hr at 03/21/21 1000   sodium chloride Stopped (03/21/21 0026)   ceFEPime (MAXIPIME)  IV     heparin 900 Units/hr (03/21/21 1000)   phenylephrine (NEO-SYNEPHRINE) Adult infusion Stopped (03/21/21 0027)     LOS: 1 day    Time spent: 40 minutes    Irine Seal, MD [facility]   To contact the attending provider between 7A-7P or the covering provider during after hours 7P-7A, please log into the web site [email or web address] and access using universal [place] password for that web site. If  you do not have the password, please call the hospital operator.  03/21/2021, 11:27 AM

## 2021-03-21 NOTE — Plan of Care (Signed)
Dicussed with patient plan of care for the evening, pain management and IV fluids with some teach back displayed.  Problem: Education: Goal: Knowledge of General Education information will improve Description: Including pain rating scale, medication(s)/side effects and non-pharmacologic comfort measures Outcome: Progressing

## 2021-03-21 NOTE — Progress Notes (Signed)
Lower extremity venous has been completed.   Preliminary results in CV Proc.   Gerald Nelson Gerald Nelson 03/21/2021 9:03 AM

## 2021-03-21 NOTE — Progress Notes (Signed)
Pharmacy Antibiotic Note  Gerald Nelson is a 70 y.o. male hx COPD and CKD presented to the ED on 03/20/21 with c/o SOB. He was given one dose of vancomycin and cefepime in the ED and started on ceftriaxone on admission for COPD exacerbation. Pharmacy has been consulted on 9/17 to change abx to cefepime for sepsis.  Plan: - cefepime 2gm IV q24h - monitor renal function closely and adjust dose if/when appropriate  _________________________________  Height: '5\' 10"'$  (177.8 cm) Weight: 54.5 kg (120 lb 2.4 oz) IBW/kg (Calculated) : 73  Temp (24hrs), Avg:97.8 F (36.6 C), Min:97.2 F (36.2 C), Max:98.8 F (37.1 C)  Recent Labs  Lab 03/20/21 1628 03/20/21 1717 03/20/21 1928 03/21/21 0252  WBC 23.4*  --   --  19.9*  CREATININE 4.38* 4.50*  --  3.64*  LATICACIDVEN 1.7  --  1.7  --     Estimated Creatinine Clearance: 15 mL/min (A) (by C-G formula based on SCr of 3.64 mg/dL (H)).    Allergies  Allergen Reactions   Sulfa Antibiotics Nausea Only      Thank you for allowing pharmacy to be a part of this patient's care.  Lynelle Doctor 03/21/2021 10:34 AM

## 2021-03-21 NOTE — Progress Notes (Signed)
PHARMACY - PHYSICIAN COMMUNICATION CRITICAL VALUE ALERT - BLOOD CULTURE IDENTIFICATION (BCID)  Gerald Nelson is an 70 y.o. male who presented to [facility] on 03/20/2021 with a chief complaint of SOB. Ceftriaxone changed to cefepime this morning  due to clinical status. Now 1 of 3 bottle culture bottles has GNR (BCID = Ecoli).   Name of physician (or Provider) Contacted: Dr. Grandville Silos  Current antibiotics: cefepime 2gm q24h  Changes to prescribed antibiotics recommended:  - per Dr. Grandville Silos, continue with cefepime for now  Results for orders placed or performed during the hospital encounter of 03/20/21  Blood Culture ID Panel (Reflexed) (Collected: 03/20/2021  4:29 PM)  Result Value Ref Range   Enterococcus faecalis NOT DETECTED NOT DETECTED   Enterococcus Faecium NOT DETECTED NOT DETECTED   Listeria monocytogenes NOT DETECTED NOT DETECTED   Staphylococcus species NOT DETECTED NOT DETECTED   Staphylococcus aureus (BCID) NOT DETECTED NOT DETECTED   Staphylococcus epidermidis NOT DETECTED NOT DETECTED   Staphylococcus lugdunensis NOT DETECTED NOT DETECTED   Streptococcus species NOT DETECTED NOT DETECTED   Streptococcus agalactiae NOT DETECTED NOT DETECTED   Streptococcus pneumoniae NOT DETECTED NOT DETECTED   Streptococcus pyogenes NOT DETECTED NOT DETECTED   A.calcoaceticus-baumannii NOT DETECTED NOT DETECTED   Bacteroides fragilis NOT DETECTED NOT DETECTED   Enterobacterales DETECTED (A) NOT DETECTED   Enterobacter cloacae complex NOT DETECTED NOT DETECTED   Escherichia coli DETECTED (A) NOT DETECTED   Klebsiella aerogenes NOT DETECTED NOT DETECTED   Klebsiella oxytoca NOT DETECTED NOT DETECTED   Klebsiella pneumoniae NOT DETECTED NOT DETECTED   Proteus species NOT DETECTED NOT DETECTED   Salmonella species NOT DETECTED NOT DETECTED   Serratia marcescens NOT DETECTED NOT DETECTED   Haemophilus influenzae NOT DETECTED NOT DETECTED   Neisseria meningitidis NOT DETECTED NOT  DETECTED   Pseudomonas aeruginosa NOT DETECTED NOT DETECTED   Stenotrophomonas maltophilia NOT DETECTED NOT DETECTED   Candida albicans NOT DETECTED NOT DETECTED   Candida auris NOT DETECTED NOT DETECTED   Candida glabrata NOT DETECTED NOT DETECTED   Candida krusei NOT DETECTED NOT DETECTED   Candida parapsilosis NOT DETECTED NOT DETECTED   Candida tropicalis NOT DETECTED NOT DETECTED   Cryptococcus neoformans/gattii NOT DETECTED NOT DETECTED   CTX-M ESBL NOT DETECTED NOT DETECTED   Carbapenem resistance IMP NOT DETECTED NOT DETECTED   Carbapenem resistance KPC NOT DETECTED NOT DETECTED   Carbapenem resistance NDM NOT DETECTED NOT DETECTED   Carbapenem resist OXA 48 LIKE NOT DETECTED NOT DETECTED   Carbapenem resistance VIM NOT DETECTED NOT DETECTED    Lynelle Doctor 03/21/2021  11:39 AM

## 2021-03-21 NOTE — Progress Notes (Signed)
ANTICOAGULATION CONSULT NOTE   Pharmacy Consult for heparin Indication: r/o VTE  Allergies  Allergen Reactions   Sulfa Antibiotics Nausea Only    Patient Measurements: Height: '5\' 10"'$  (177.8 cm) Weight: 54.5 kg (120 lb 2.4 oz) IBW/kg (Calculated) : 73 Heparin Dosing Weight: 54 kg  Vital Signs: Temp: 97.9 F (36.6 C) (09/17 0730) Temp Source: Axillary (09/17 0730) BP: 94/62 (09/17 0700) Pulse Rate: 63 (09/17 0357)  Labs: Recent Labs    03/20/21 1628 03/20/21 1717 03/20/21 1928 03/21/21 0252  HGB 11.2* 10.9*  --  10.7*  HCT 32.1* 32.0*  --  30.5*  PLT 111*  --   --  99*  CREATININE 4.38* 4.50*  --  3.64*  TROPONINIHS 25*  --  25*  --     Estimated Creatinine Clearance: 15 mL/min (A) (by C-G formula based on SCr of 3.64 mg/dL (H)).    Assessment: Patient is a 70 y.o M with hx COPD and CKD presented to the ED on 03/20/21 with c/o SOB.  Ddimer was found to be elevated. Pharmacy has been consulted to start heparin drip for r/o VTE.  Goal of Therapy:  Heparin level 0.3-0.7 units/ml Monitor platelets by anticoagulation protocol: Yes   Plan:  - d/c heparin 5000 units SQ q8h (last dose given to patient at 0700 on 9/17) - start heparin drip at 900 units/hr (no bolus since pt recently got the hep SQ dose this morning) - check 8 hr heparin level - f/u V/Q scan - monitor for s/sx bleeding  Dyami Umbach P 03/21/2021,8:40 AM

## 2021-03-22 DIAGNOSIS — B9620 Unspecified Escherichia coli [E. coli] as the cause of diseases classified elsewhere: Secondary | ICD-10-CM

## 2021-03-22 DIAGNOSIS — J441 Chronic obstructive pulmonary disease with (acute) exacerbation: Secondary | ICD-10-CM | POA: Diagnosis not present

## 2021-03-22 DIAGNOSIS — I10 Essential (primary) hypertension: Secondary | ICD-10-CM | POA: Diagnosis not present

## 2021-03-22 DIAGNOSIS — N179 Acute kidney failure, unspecified: Secondary | ICD-10-CM | POA: Diagnosis not present

## 2021-03-22 DIAGNOSIS — R7881 Bacteremia: Secondary | ICD-10-CM

## 2021-03-22 DIAGNOSIS — N1832 Chronic kidney disease, stage 3b: Secondary | ICD-10-CM | POA: Diagnosis not present

## 2021-03-22 LAB — CBC WITH DIFFERENTIAL/PLATELET
Abs Immature Granulocytes: 1.24 10*3/uL — ABNORMAL HIGH (ref 0.00–0.07)
Basophils Absolute: 0.1 10*3/uL (ref 0.0–0.1)
Basophils Relative: 1 %
Eosinophils Absolute: 0 10*3/uL (ref 0.0–0.5)
Eosinophils Relative: 0 %
HCT: 30.6 % — ABNORMAL LOW (ref 39.0–52.0)
Hemoglobin: 10.7 g/dL — ABNORMAL LOW (ref 13.0–17.0)
Immature Granulocytes: 6 %
Lymphocytes Relative: 3 %
Lymphs Abs: 0.6 10*3/uL — ABNORMAL LOW (ref 0.7–4.0)
MCH: 29.5 pg (ref 26.0–34.0)
MCHC: 35 g/dL (ref 30.0–36.0)
MCV: 84.3 fL (ref 80.0–100.0)
Monocytes Absolute: 1.8 10*3/uL — ABNORMAL HIGH (ref 0.1–1.0)
Monocytes Relative: 9 %
Neutro Abs: 16.5 10*3/uL — ABNORMAL HIGH (ref 1.7–7.7)
Neutrophils Relative %: 81 %
Platelets: 96 10*3/uL — ABNORMAL LOW (ref 150–400)
RBC: 3.63 MIL/uL — ABNORMAL LOW (ref 4.22–5.81)
RDW: 13.4 % (ref 11.5–15.5)
WBC: 20.2 10*3/uL — ABNORMAL HIGH (ref 4.0–10.5)
nRBC: 0 % (ref 0.0–0.2)

## 2021-03-22 LAB — COMPREHENSIVE METABOLIC PANEL
ALT: 19 U/L (ref 0–44)
AST: 18 U/L (ref 15–41)
Albumin: 2.5 g/dL — ABNORMAL LOW (ref 3.5–5.0)
Alkaline Phosphatase: 62 U/L (ref 38–126)
Anion gap: 10 (ref 5–15)
BUN: 82 mg/dL — ABNORMAL HIGH (ref 8–23)
CO2: 20 mmol/L — ABNORMAL LOW (ref 22–32)
Calcium: 8.6 mg/dL — ABNORMAL LOW (ref 8.9–10.3)
Chloride: 115 mmol/L — ABNORMAL HIGH (ref 98–111)
Creatinine, Ser: 2.97 mg/dL — ABNORMAL HIGH (ref 0.61–1.24)
GFR, Estimated: 22 mL/min — ABNORMAL LOW (ref >60)
Glucose, Bld: 159 mg/dL — ABNORMAL HIGH (ref 70–99)
Potassium: 4.2 mmol/L (ref 3.5–5.1)
Sodium: 145 mmol/L (ref 135–145)
Total Bilirubin: 0.7 mg/dL (ref 0.3–1.2)
Total Protein: 6.1 g/dL — ABNORMAL LOW (ref 6.5–8.1)

## 2021-03-22 LAB — MAGNESIUM: Magnesium: 2.6 mg/dL — ABNORMAL HIGH (ref 1.7–2.4)

## 2021-03-22 LAB — IRON AND TIBC
Iron: 91 ug/dL (ref 45–182)
Saturation Ratios: 64 % — ABNORMAL HIGH (ref 17.9–39.5)
TIBC: 143 ug/dL — ABNORMAL LOW (ref 250–450)
UIBC: 52 ug/dL

## 2021-03-22 LAB — VITAMIN B12: Vitamin B-12: 805 pg/mL (ref 180–914)

## 2021-03-22 LAB — URINE CULTURE: Culture: NO GROWTH

## 2021-03-22 LAB — FOLATE: Folate: 7.1 ng/mL (ref >5.9)

## 2021-03-22 LAB — FERRITIN: Ferritin: 603 ng/mL — ABNORMAL HIGH (ref 24–336)

## 2021-03-22 LAB — T4, FREE: Free T4: 0.87 ng/dL (ref 0.61–1.12)

## 2021-03-22 MED ORDER — SODIUM CHLORIDE 0.45 % IV SOLN: INTRAVENOUS | Status: DC

## 2021-03-22 MED ORDER — SODIUM CHLORIDE 0.9 % IV SOLN
2.0000 g | INTRAVENOUS | Status: DC
  Administered 2021-03-22: 2 g via INTRAVENOUS
  Filled 2021-03-22: qty 20

## 2021-03-22 MED ORDER — METHYLPREDNISOLONE SODIUM SUCC 125 MG IJ SOLR
60.0000 mg | Freq: Every day | INTRAMUSCULAR | Status: DC
  Administered 2021-03-23 – 2021-03-26 (×4): 60 mg via INTRAVENOUS
  Filled 2021-03-22 (×4): qty 2

## 2021-03-22 NOTE — Progress Notes (Addendum)
PROGRESS NOTE    Gerald Nelson  [id number] DOB: 1951-04-01 DOA: 03/20/2021 PCP: [name], MD    Chief Complaint  Patient presents with   Shortness of Breath    Brief Narrative:  Patient is a 70 year old gentleman history of COPD with ongoing tobacco use, prior CVA, hypertension, CHF, CKD 3, history of mitral regurg presented to the ED with hypoxia, noted to be hypotensive, noted to have a leukocytosis.  Patient placed on empiric IV antibiotics, pancultured.  Patient placed on IV fluids which aided with hypotension.  And D-dimer noted to be elevated at approximately 6.  Patient being treated for COPD exacerbation.  VQ scan ordered and pending.  Lower extremity Dopplers negative for DVT.  Patient placed empirically on IV heparin pending VQ scan results.  IV antibiotic coverage broadened.   Assessment & Plan:   Principal Problem:   Acute respiratory failure with hypoxia (HCC) Active Problems:   COPD exacerbation (HCC)   E coli bacteremia   Hypotension   Leukocytosis   Schizophrenia (HCC)   Cerebral infarction ([place])   CKD (chronic kidney disease), stage III (HCC)   Essential hypertension   CHF (congestive heart failure) (HCC)   Tobacco abuse   Mitral regurgitation   Hyperlipidemia   Pulmonary hypertension (HCC)   Stage 3 severe COPD by GOLD classification ([place])   Acute renal failure (ARF) ([place])  #1 acute respiratory failure with hypoxia felt secondary to acute COPD exacerbation.  Not on home O2. -Patient admitted with hypoxia, shortness of breath, leukocytosis and noted to be hypotensive requiring BiPAP on the evening of admission. -Currently on room air with sats of 91%. -D-dimer noted to be elevated at 6.05 and patient noted to be hypotensive on admission and as such VQ scan was obtained with very low probability for PE -Patient with ongoing expiratory wheezing. -Decrease IV Solu-Medrol to 60 mg daily. -Continue scheduled duo nebs, Pulmicort nebs, PPI, Claritin,  Flonase. -Tobacco cessation stressed to patient. -Follow-up..   2.  Acute renal failure on chronic kidney disease stage IIIb -Likely secondary to prerenal azotemia in the setting of diuretics, ARB, ARB, spironolactone.  Patient also noted to be hypotensive on admission. -Creatinine on admission as high as 4.50. -Baseline creatinine 1.6-1.9. -Urinalysis nitrite negative, leukocytes negative, negative for protein.  Urine sodium of 12.  Urine creatinine of 70.29.   -Creatinine trending down currently at 2.97.   -Urine output of 2.550 L over the past 24 hours.  -Renal ultrasound with lateral echogenic kidneys likely reflecting CKD, no evidence of hydronephrosis, area of parenchymal irregularity and heterogenicity along lower pole of left kidney not well visualized likely heterogenicity of renal parenchyma follow-up ultrasound, bladder wall irregularity with component of bladder wall thickening some degree of underlying cystitis cannot be ruled out.   -Urine cultures pending.   -Continue to hold diuretics, ARB.   -Change IV fluids to half-normal saline at 100 cc/h.   -Strict I's and O's.   -Daily weights.   -Follow.   3.  Hypotension -Likely secondary to volume depletion versus infectious etiology. -Blood pressure has responded to IV fluids.   -VQ scan with low probability for PE.   -Continue empiric IV antibiotics.  Narrow IV cefepime to IV Rocephin. -Follow.    4.  Abnormal TSH -Free T4 within normal limits. -Outpatient follow-up with repeat studies in 4 to 6 weeks.  5.  Leukocytosis/concern for sepsis ruled out/probable E. coli bacteremia -Likely secondary to E. coli bacteremia.  1/3 blood cultures positive for E.  coli. -Patient had presented with hypotension which has responded to IV fluids, altered mental status with clinical improvement, acute respiratory failure with hypoxia, significant leukocytosis with white count of 23.4 on admission. -Patient improving clinically, blood  pressure responded to IV fluids. -Leukocytosis at 20.2 however patient on IV steroids as well. -Patient afebrile. -Urinalysis nitrite negative, leukocytes negative. -Urine cultures pending. -Lower extremity Dopplers negative for DVT. -VQ scan with low probability for PE. -We will narrow IV antibiotics from IV cefepime back to IV Rocephin. -Supportive care.  6.  Ongoing tobacco use -Nicotine patch 21 mg daily.   -Tobacco cessation stressed to patient.    7.  Hyperlipidemia -Statin.  8.  History of mitral regurgitation -Monitor. -Outpatient follow-up with cardiology.  9.  Pulmonary hypertension  10.  History of CVA -Continue aspirin, statin.  11.  History of schizophrenia -Continue home regimen Haldol.  12.  History of CHF -Patient noted to have had a EF as low as 15% but most recent 2D echo from 04/01/2020 with a EF of 55%,NWMA, moderate LVH, grade 1 DD, normal mitral valve structure, no mitral valve regurgitation, no evidence of mitral stenosis. -Continue to hold spironolactone, Lasix, ARB due to presentation with hypotension and acute renal failure.   -On Zebeta.   -Outpatient follow-up with cardiology.   13.  Hypertension -Due to presentation with hypotension antihypertensive medications on hold except Zebeta. -Continue IV fluids.   -Follow.     DVT prophylaxis: Heparin Code Status: Full Family Communication: Updated patient.  No family at bedside. Disposition:   Status is: Inpatient  Remains inpatient appropriate because:Inpatient level of care appropriate due to severity of illness  Dispo: The patient is from: Home              Anticipated d/c is to: TBD              Patient currently is not medically stable to d/c.   Difficult to place patient No       Consultants:  None  Procedures:  Lower extremity Dopplers 03/21/2021 Chest x-ray 03/20/2021 VQ scan 03/21/2021 Renal ultrasound 03/21/2021    Antimicrobials: IV Rocephin 03/20/2021>>>> 03/21/2021 IV  cefepime 03/21/2021>>>>> 03/22/2021 IV Rocephin 03/22/2021>>>>>   Subjective: Sleeping but arousable.  Sats of 91% on room air.  Denies any chest pain.  Feels shortness of breath is improved.  No abdominal pain.  Overall feeling better than admission.  Tolerating current diet.  Objective: Vitals:   03/22/21 0600 03/22/21 0625 03/22/21 0800 03/22/21 0859  BP: 129/71  (!) 139/59   Pulse:   63 77  Resp: '20 16 18 19  '$ Temp:   (!) 97.5 F (36.4 C)   TempSrc:   Oral   SpO2:  91% 94% 91%  Weight:      Height:        Intake/Output Summary (Last 24 hours) at 03/22/2021 1037 Last data filed at 03/22/2021 0900 Gross per 24 hour  Intake 2541.1 ml  Output 2550 ml  Net -8.9 ml   Filed Weights   03/20/21 2145 03/21/21 1119 03/22/21 0341  Weight: 54.5 kg 60.9 kg 57.6 kg    Examination:  General exam: Dry mucous membranes. Respiratory system: Mild expiratory wheezing.  No crackles.  Fair air movement.  Speaking in full sentences.  Cardiovascular system: Regular rate rhythm no murmurs rubs or gallops.  No JVD.  No lower extremity edema.  Gastrointestinal system: Abdomen is soft, nontender, nondistended, positive bowel sounds.  No rebound.  No guarding. Central nervous  system: Alert and oriented. No focal neurological deficits. Extremities: Symmetric 5 x 5 power. Skin: No rashes, lesions or ulcers Psychiatry: Judgement and insight appear normal. Mood & affect appropriate.     Data Reviewed: I have personally reviewed following labs and imaging studies  CBC: Recent Labs  Lab 03/20/21 1628 03/20/21 1717 03/21/21 0252 03/22/21 0303  WBC 23.4*  --  19.9* 20.2*  NEUTROABS 19.8*  --   --  16.5*  HGB 11.2* 10.9* 10.7* 10.7*  HCT 32.1* 32.0* 30.5* 30.6*  MCV 85.6  --  84.5 84.3  PLT 111*  --  99* 96*    Basic Metabolic Panel: Recent Labs  Lab 03/20/21 1628 03/20/21 1717 03/21/21 0252 03/22/21 0303  NA 137 136 139 145  K 4.4 4.6 4.7 4.2  CL 102 105 107 115*  CO2 22  --  16* 20*   GLUCOSE 118* 109* 120* 159*  BUN 95* 88* 84* 82*  CREATININE 4.38* 4.50* 3.64* 2.97*  CALCIUM 8.8*  --  8.7* 8.6*  MG  --   --   --  2.6*    GFR: Estimated Creatinine Clearance: 19.4 mL/min (A) (by C-G formula based on SCr of 2.97 mg/dL (H)).  Liver Function Tests: Recent Labs  Lab 03/20/21 1628 03/21/21 0252 03/22/21 0303  AST '29 27 18  '$ ALT '23 22 19  '$ ALKPHOS 76 59 62  BILITOT 1.7* 1.2 0.7  PROT 6.8 6.4* 6.1*  ALBUMIN 2.9* 2.8* 2.5*    CBG: No results for input(s): GLUCAP in the last 168 hours.   Recent Results (from the past 240 hour(s))  Blood culture (routine x 2)     Status: Abnormal (Preliminary result)   Collection Time: 03/20/21  4:29 PM   Specimen: BLOOD RIGHT ARM  Result Value Ref Range Status   Specimen Description   Final    BLOOD RIGHT ARM Performed at [facility], [place] [address], [place], [place] [address]    Special Requests   Final    BOTTLES DRAWN AEROBIC AND ANAEROBIC Blood Culture adequate volume Performed at [place] [address], [place], [place] [address]    Culture  Setup Time   Final    GRAM NEGATIVE RODS IN BOTH AEROBIC AND ANAEROBIC BOTTLES CRITICAL RESULT CALLED TO, READ BACK BY AND VERIFIED WITH: PHARM D C.SHADE ON [id number] AT 1135 BY E.PARRISH    Culture (A)  Final    ESCHERICHIA COLI SUSCEPTIBILITIES TO FOLLOW Performed at [facility], [place] [address] [place], [place] [address]    Report Status PENDING  Incomplete  Blood Culture ID Panel (Reflexed)     Status: Abnormal   Collection Time: 03/20/21  4:29 PM  Result Value Ref Range Status   Enterococcus faecalis NOT DETECTED NOT DETECTED Final   Enterococcus Faecium NOT DETECTED NOT DETECTED Final   Listeria monocytogenes NOT DETECTED NOT DETECTED Final   Staphylococcus species NOT DETECTED NOT DETECTED Final   Staphylococcus aureus (BCID) NOT DETECTED NOT DETECTED Final   Staphylococcus epidermidis NOT DETECTED NOT DETECTED  Final   Staphylococcus lugdunensis NOT DETECTED NOT DETECTED Final   Streptococcus species NOT DETECTED NOT DETECTED Final   Streptococcus agalactiae NOT DETECTED NOT DETECTED Final   Streptococcus pneumoniae NOT DETECTED NOT DETECTED Final   Streptococcus pyogenes NOT DETECTED NOT DETECTED Final   A.calcoaceticus-baumannii NOT DETECTED NOT DETECTED Final   Bacteroides fragilis NOT DETECTED NOT DETECTED Final   Enterobacterales DETECTED (A) NOT DETECTED Final  Comment: Enterobacterales represent a large order of gram negative bacteria, not a single organism. CRITICAL RESULT CALLED TO, READ BACK BY AND VERIFIED WITH: PHARM D C.SHADE ON [id number] AT 1135 BY E.PARRISH    Enterobacter cloacae complex NOT DETECTED NOT DETECTED Final   Escherichia coli DETECTED (A) NOT DETECTED Final    Comment: CRITICAL RESULT CALLED TO, READ BACK BY AND VERIFIED WITH: PHARM D C.SHADE ON [id number] AT 1135 BY E.PARRISH    Klebsiella aerogenes NOT DETECTED NOT DETECTED Final   Klebsiella oxytoca NOT DETECTED NOT DETECTED Final   Klebsiella pneumoniae NOT DETECTED NOT DETECTED Final   Proteus species NOT DETECTED NOT DETECTED Final   Salmonella species NOT DETECTED NOT DETECTED Final   Serratia marcescens NOT DETECTED NOT DETECTED Final   Haemophilus influenzae NOT DETECTED NOT DETECTED Final   Neisseria meningitidis NOT DETECTED NOT DETECTED Final   Pseudomonas aeruginosa NOT DETECTED NOT DETECTED Final   Stenotrophomonas maltophilia NOT DETECTED NOT DETECTED Final   Candida albicans NOT DETECTED NOT DETECTED Final   Candida auris NOT DETECTED NOT DETECTED Final   Candida glabrata NOT DETECTED NOT DETECTED Final   Candida krusei NOT DETECTED NOT DETECTED Final   Candida parapsilosis NOT DETECTED NOT DETECTED Final   Candida tropicalis NOT DETECTED NOT DETECTED Final   Cryptococcus neoformans/gattii NOT DETECTED NOT DETECTED Final   CTX-M ESBL NOT DETECTED NOT DETECTED Final   Carbapenem resistance IMP  NOT DETECTED NOT DETECTED Final   Carbapenem resistance KPC NOT DETECTED NOT DETECTED Final   Carbapenem resistance NDM NOT DETECTED NOT DETECTED Final   Carbapenem resist OXA 48 LIKE NOT DETECTED NOT DETECTED Final   Carbapenem resistance VIM NOT DETECTED NOT DETECTED Final    Comment: Performed at [facility], [place] [address], [place],  [address]  Resp Panel by RT-PCR (Flu A&B, Covid) Nasopharyngeal Swab     Status: None   Collection Time: 03/20/21  7:28 PM   Specimen: Nasopharyngeal Swab; Nasopharyngeal(NP) swabs in vial transport medium  Result Value Ref Range Status   SARS Coronavirus 2 by RT PCR NEGATIVE NEGATIVE Final    Comment: (NOTE) SARS-CoV-2 target nucleic acids are NOT DETECTED.  The SARS-CoV-2 RNA is generally detectable in upper respiratory specimens during the acute phase of infection. The lowest concentration of SARS-CoV-2 viral copies this assay can detect is 138 copies/mL. A negative result does not preclude SARS-Cov-2 infection and should not be used as the sole basis for treatment or other patient management decisions. A negative result may occur with  improper specimen collection/handling, submission of specimen other than nasopharyngeal swab, presence of viral mutation(s) within the areas targeted by this assay, and inadequate number of viral copies(<138 copies/mL). A negative result must be combined with clinical observations, patient history, and epidemiological information. The expected result is Negative.  Fact Sheet for Patients:  EntrepreneurPulse.com.au  Fact Sheet for Healthcare Providers:  IncredibleEmployment.be  This test is no t yet approved or cleared by the [place] FDA and  has been authorized for detection and/or diagnosis of SARS-CoV-2 by FDA under an Emergency Use Authorization (EUA). This EUA will remain  in effect (meaning this test can be used) for the duration of the COVID-19  declaration under Section 564(b)(1) of the Act, 21 U.S.C.section 360bbb-3(b)(1), unless the authorization is terminated  or revoked sooner.       Influenza A by PCR NEGATIVE NEGATIVE Final   Influenza B by PCR NEGATIVE NEGATIVE Final    Comment: (NOTE) The Xpert Xpress  SARS-CoV-2/FLU/RSV plus assay is intended as an aid in the diagnosis of influenza from Nasopharyngeal swab specimens and should not be used as a sole basis for treatment. Nasal washings and aspirates are unacceptable for Xpert Xpress SARS-CoV-2/FLU/RSV testing.  Fact Sheet for Patients: EntrepreneurPulse.com.au  Fact Sheet for Healthcare Providers: IncredibleEmployment.be  This test is not yet approved or cleared by the [place] FDA and has been authorized for detection and/or diagnosis of SARS-CoV-2 by FDA under an Emergency Use Authorization (EUA). This EUA will remain in effect (meaning this test can be used) for the duration of the COVID-19 declaration under Section 564(b)(1) of the Act, 21 U.S.C. section 360bbb-3(b)(1), unless the authorization is terminated or revoked.  Performed at [facility], [place] [address] [place], [place] [address]   Blood culture (routine x 2)     Status: None (Preliminary result)   Collection Time: 03/20/21  9:46 PM   Specimen: BLOOD  Result Value Ref Range Status   Specimen Description   Final    BLOOD BLOOD RIGHT HAND Performed at [place] [address], [place], [place] [address]    Special Requests   Final    BOTTLES DRAWN AEROBIC ONLY Blood Culture results may not be optimal due to an inadequate volume of blood received in culture bottles Performed at [place] [address] [place], [place] [address]    Culture   Final    NO GROWTH 1 DAY Performed at [facility], [place] [address], [place], [place] [address]    Report Status PENDING  Incomplete  MRSA Next  Gen by PCR, Nasal     Status: None   Collection Time: 03/20/21  9:58 PM   Specimen: Nasal Mucosa; Nasal Swab  Result Value Ref Range Status   MRSA by PCR Next Gen NOT DETECTED NOT DETECTED Final    Comment: (NOTE) The GeneXpert MRSA Assay (FDA approved for NASAL specimens only), is one component of a comprehensive MRSA colonization surveillance program. It is not intended to diagnose MRSA infection nor to guide or monitor treatment for MRSA infections. Test performance is not FDA approved in patients less than 8 years old. Performed at [facility], [place] [address] [place],  [address]          Radiology Studies: NM Pulmonary Perfusion  Result Date: 03/21/2021 CLINICAL DATA:  PE suspected EXAM: NUCLEAR MEDICINE PERFUSION LUNG SCAN TECHNIQUE: Perfusion images were obtained in multiple projections after intravenous injection of radiopharmaceutical. Ventilation scans intentionally deferred if perfusion scan and chest x-ray adequate for interpretation during COVID 19 epidemic. RADIOPHARMACEUTICALS:  4.35 mCi Tc-68mMAA IV COMPARISON:  Chest radiograph, 03/20/2021 FINDINGS: Per technologist, all views are acquired with patient arms in a down position, which limits evaluation. Within this limitation, there is mildly heterogeneous perfusion of the lungs, in keeping with known emphysema, without suspicious peripheral or wedge-shaped perfusion defect. IMPRESSION: 1. Limited examination secondary to patient arm positioning. Within this limitation, very low probability for pulmonary embolism by modified perfusion only PIOPED criteria (PE absent). 2.  Heterogeneous perfusion of the lungs, in keeping with emphysema. Electronically Signed   By: AEddie CandleM.D.   On: 03/21/2021 13:46   [place]  Result Date: 03/21/2021 CLINICAL DATA:  Acute renal failure. EXAM: RENAL / URINARY TRACT ULTRASOUND COMPLETE COMPARISON:  03/11/2021 and CT of the abdomen without contrast on  12/11/2013 FINDINGS: Right Kidney: Renal measurements: 10.2 x 5.2 x 5.1 cm = volume: 141  mL. Diffusely increased cortical echogenicity again noted without significant atrophy. No hydronephrosis or focal lesion. Left Kidney: Renal measurements: 10.9 x 5.7 x 6.4 cm = volume: 207 mL. Similar diffusely increased cortical echogenicity without atrophy. No hydronephrosis. Area of parenchymal irregularity noted by ultrasound at the level of lower pole cortex measuring on the order of 2 cm. This is not well seen and is felt to most likely be heterogeneity of the renal parenchyma and not a discrete focal lesion. Sonographic follow-up may be helpful. Bladder: The bladder is moderately distended and there is some component bladder wall thickening and irregularity. Other: None. IMPRESSION: 1. Bilateral echogenic kidneys likely reflecting chronic kidney disease. No evidence of hydronephrosis. 2. Area of parenchymal irregularity and heterogeneity along the lower pole of the left kidney is not well visualized and is felt to most likely be heterogeneity of the renal parenchyma. Follow-up ultrasound may be helpful. 3. Bladder wall irregularity with component of bladder wall thickening. Some degree of underlying cystitis cannot be excluded. Electronically Signed   By: Aletta Edouard M.D.   On: 03/21/2021 11:33   DG Chest Portable 1 View  Result Date: 03/20/2021 CLINICAL DATA:  Dyspnea with COPD exacerbation EXAM: PORTABLE CHEST 1 VIEW COMPARISON:  10/08/2020 FINDINGS: Cardiomegaly. Both lungs are clear. The visualized skeletal structures are unremarkable. IMPRESSION: Cardiomegaly without acute abnormality of the lungs in AP portable projection. Electronically Signed   By: Eddie Candle M.D.   On: 03/20/2021 17:42   VAS [place] LOWER EXTREMITY VENOUS (DVT)  Result Date: 03/21/2021  Lower Venous DVT Study Patient Name:  ROMAINE GERRING  Date of Exam:   03/21/2021 Medical Rec #: [id number]       Accession #:    [id number] Date of Birth:  Jul 26, 1950        Patient Gender: M Patient Age:   89 years Exam Location:  [facility] Procedure:      VAS [place] LOWER EXTREMITY VENOUS (DVT) Referring Phys: Irine Seal --------------------------------------------------------------------------------  Indications: Swelling.  Comparison Study: no prior Performing Technologist: Archie Patten RVS  Examination Guidelines: A complete evaluation includes B-mode imaging, spectral Doppler, color Doppler, and power Doppler as needed of all accessible portions of each vessel. Bilateral testing is considered an integral part of a complete examination. Limited examinations for reoccurring indications may be performed as noted. The reflux portion of the exam is performed with the patient in reverse Trendelenburg.  +---------+---------------+---------+-----------+----------+--------------+ RIGHT    CompressibilityPhasicitySpontaneityPropertiesThrombus Aging +---------+---------------+---------+-----------+----------+--------------+ CFV      Full           Yes      Yes                                 +---------+---------------+---------+-----------+----------+--------------+ SFJ      Full                                                        +---------+---------------+---------+-----------+----------+--------------+ FV Prox  Full                                                        +---------+---------------+---------+-----------+----------+--------------+  FV Mid   Full                                                        +---------+---------------+---------+-----------+----------+--------------+ FV DistalFull                                                        +---------+---------------+---------+-----------+----------+--------------+ PFV      Full                                                        +---------+---------------+---------+-----------+----------+--------------+ POP      Full           Yes       Yes                                 +---------+---------------+---------+-----------+----------+--------------+ PTV      Full                                                        +---------+---------------+---------+-----------+----------+--------------+ PERO     Full                                                        +---------+---------------+---------+-----------+----------+--------------+   +---------+---------------+---------+-----------+----------+--------------+ LEFT     CompressibilityPhasicitySpontaneityPropertiesThrombus Aging +---------+---------------+---------+-----------+----------+--------------+ CFV      Full           Yes      Yes                                 +---------+---------------+---------+-----------+----------+--------------+ SFJ      Full                                                        +---------+---------------+---------+-----------+----------+--------------+ FV Prox  Full                                                        +---------+---------------+---------+-----------+----------+--------------+ FV Mid   Full                                                        +---------+---------------+---------+-----------+----------+--------------+  FV DistalFull                                                        +---------+---------------+---------+-----------+----------+--------------+ PFV      Full                                                        +---------+---------------+---------+-----------+----------+--------------+ POP      Full           Yes      Yes                                 +---------+---------------+---------+-----------+----------+--------------+ PTV      Full                                                        +---------+---------------+---------+-----------+----------+--------------+ PERO     Full                                                         +---------+---------------+---------+-----------+----------+--------------+     Summary: BILATERAL: - No evidence of deep vein thrombosis seen in the lower extremities, bilaterally. -No evidence of popliteal cyst, bilaterally.   *See table(s) above for measurements and observations.    Preliminary         Scheduled Meds:  aspirin EC  81 mg Oral Daily   atorvastatin  40 mg Oral QPM   bisoprolol  10 mg Oral Daily   budesonide (PULMICORT) nebulizer solution  0.5 mg Nebulization BID   chlorhexidine  15 mL Mouth Rinse BID   Chlorhexidine Gluconate Cloth  6 each Topical Q0600   fluticasone  2 spray Each Nare Daily   haloperidol  5 mg Oral BID   heparin injection (subcutaneous)  5,000 Units Subcutaneous Q8H   ipratropium-albuterol  3 mL Nebulization Q6H   loratadine  10 mg Oral Daily   mouth rinse  15 mL Mouth Rinse q12n4p   methylPREDNISolone (SOLU-MEDROL) injection  120 mg Intravenous Daily   nicotine  21 mg Transdermal Daily   pantoprazole  40 mg Oral Q0600   phenylephrine  80-200 mcg Intravenous Once   sodium bicarbonate  650 mg Oral BID   Continuous Infusions:  sodium chloride 100 mL/hr at 03/22/21 0842   sodium chloride Stopped (03/21/21 1529)   ceFEPime (MAXIPIME) IV 2 g (03/22/21 1029)     LOS: 2 days    Time spent: 40 minutes    Irine Seal, MD [facility]   To contact the attending provider between 7A-7P or the covering provider during after hours 7P-7A, please log into the web site [email or web address] and access using universal [place] password for that web site. If you do not have the password, please call the hospital operator.  03/22/2021, 10:38 AM

## 2021-03-23 ENCOUNTER — Inpatient Hospital Stay (HOSPITAL_COMMUNITY): Payer: Medicare Other

## 2021-03-23 DIAGNOSIS — J690 Pneumonitis due to inhalation of food and vomit: Secondary | ICD-10-CM

## 2021-03-23 DIAGNOSIS — J441 Chronic obstructive pulmonary disease with (acute) exacerbation: Secondary | ICD-10-CM | POA: Diagnosis not present

## 2021-03-23 DIAGNOSIS — N1832 Chronic kidney disease, stage 3b: Secondary | ICD-10-CM | POA: Diagnosis not present

## 2021-03-23 DIAGNOSIS — I10 Essential (primary) hypertension: Secondary | ICD-10-CM | POA: Diagnosis not present

## 2021-03-23 DIAGNOSIS — K5900 Constipation, unspecified: Secondary | ICD-10-CM | POA: Diagnosis present

## 2021-03-23 DIAGNOSIS — N179 Acute kidney failure, unspecified: Secondary | ICD-10-CM | POA: Diagnosis not present

## 2021-03-23 LAB — CBC WITH DIFFERENTIAL/PLATELET
Abs Immature Granulocytes: 1.46 10*3/uL — ABNORMAL HIGH (ref 0.00–0.07)
Basophils Absolute: 0 10*3/uL (ref 0.0–0.1)
Basophils Relative: 0 %
Eosinophils Absolute: 0 10*3/uL (ref 0.0–0.5)
Eosinophils Relative: 0 %
HCT: 27.8 % — ABNORMAL LOW (ref 39.0–52.0)
Hemoglobin: 9.8 g/dL — ABNORMAL LOW (ref 13.0–17.0)
Immature Granulocytes: 10 %
Lymphocytes Relative: 8 %
Lymphs Abs: 1.1 10*3/uL (ref 0.7–4.0)
MCH: 29.6 pg (ref 26.0–34.0)
MCHC: 35.3 g/dL (ref 30.0–36.0)
MCV: 84 fL (ref 80.0–100.0)
Monocytes Absolute: 1.2 10*3/uL — ABNORMAL HIGH (ref 0.1–1.0)
Monocytes Relative: 8 %
Neutro Abs: 11.3 10*3/uL — ABNORMAL HIGH (ref 1.7–7.7)
Neutrophils Relative %: 74 %
Platelets: 79 10*3/uL — ABNORMAL LOW (ref 150–400)
RBC: 3.31 MIL/uL — ABNORMAL LOW (ref 4.22–5.81)
RDW: 13.4 % (ref 11.5–15.5)
WBC: 15.1 10*3/uL — ABNORMAL HIGH (ref 4.0–10.5)
nRBC: 0 % (ref 0.0–0.2)

## 2021-03-23 LAB — CULTURE, BLOOD (ROUTINE X 2): Special Requests: ADEQUATE

## 2021-03-23 LAB — RENAL FUNCTION PANEL
Albumin: 2.4 g/dL — ABNORMAL LOW (ref 3.5–5.0)
Anion gap: 9 (ref 5–15)
BUN: 66 mg/dL — ABNORMAL HIGH (ref 8–23)
CO2: 19 mmol/L — ABNORMAL LOW (ref 22–32)
Calcium: 8.1 mg/dL — ABNORMAL LOW (ref 8.9–10.3)
Chloride: 113 mmol/L — ABNORMAL HIGH (ref 98–111)
Creatinine, Ser: 2.58 mg/dL — ABNORMAL HIGH (ref 0.61–1.24)
GFR, Estimated: 26 mL/min — ABNORMAL LOW (ref >60)
Glucose, Bld: 117 mg/dL — ABNORMAL HIGH (ref 70–99)
Phosphorus: 2.5 mg/dL (ref 2.5–4.6)
Potassium: 4 mmol/L (ref 3.5–5.1)
Sodium: 141 mmol/L (ref 135–145)

## 2021-03-23 LAB — MAGNESIUM: Magnesium: 2.1 mg/dL (ref 1.7–2.4)

## 2021-03-23 MED ORDER — SENNOSIDES-DOCUSATE SODIUM 8.6-50 MG PO TABS
1.0000 | ORAL_TABLET | Freq: Two times a day (BID) | ORAL | Status: DC
  Administered 2021-03-23 – 2021-03-26 (×7): 1 via ORAL
  Filled 2021-03-23 (×7): qty 1

## 2021-03-23 MED ORDER — SODIUM BICARBONATE 650 MG PO TABS
650.0000 mg | ORAL_TABLET | Freq: Three times a day (TID) | ORAL | Status: DC
  Administered 2021-03-23 – 2021-03-25 (×7): 650 mg via ORAL
  Filled 2021-03-23 (×7): qty 1

## 2021-03-23 MED ORDER — POLYETHYLENE GLYCOL 3350 17 G PO PACK
17.0000 g | PACK | Freq: Two times a day (BID) | ORAL | Status: DC
  Administered 2021-03-23 – 2021-03-26 (×7): 17 g via ORAL
  Filled 2021-03-23 (×7): qty 1

## 2021-03-23 MED ORDER — SODIUM CHLORIDE 0.9 % IV SOLN
3.0000 g | Freq: Two times a day (BID) | INTRAVENOUS | Status: DC
  Administered 2021-03-24: 3 g via INTRAVENOUS
  Filled 2021-03-23: qty 8

## 2021-03-23 MED ORDER — SODIUM CHLORIDE 0.9 % IV SOLN
3.0000 g | Freq: Two times a day (BID) | INTRAVENOUS | Status: DC
  Filled 2021-03-23: qty 8

## 2021-03-23 MED ORDER — AMLODIPINE BESYLATE 5 MG PO TABS
5.0000 mg | ORAL_TABLET | Freq: Every day | ORAL | Status: DC
  Administered 2021-03-23 – 2021-03-24 (×2): 5 mg via ORAL
  Filled 2021-03-23 (×2): qty 1

## 2021-03-23 MED ORDER — ISOSORB DINITRATE-HYDRALAZINE 20-37.5 MG PO TABS
1.0000 | ORAL_TABLET | Freq: Three times a day (TID) | ORAL | Status: DC
  Administered 2021-03-23 – 2021-03-26 (×8): 1 via ORAL
  Filled 2021-03-23 (×9): qty 1

## 2021-03-23 MED ORDER — SODIUM CHLORIDE 0.9 % IV SOLN
500.0000 mg | INTRAVENOUS | Status: DC
  Administered 2021-03-23 – 2021-03-24 (×2): 500 mg via INTRAVENOUS
  Filled 2021-03-23 (×2): qty 500

## 2021-03-23 MED ORDER — METHYLPREDNISOLONE SODIUM SUCC 125 MG IJ SOLR
60.0000 mg | Freq: Once | INTRAMUSCULAR | Status: AC
  Administered 2021-03-23: 60 mg via INTRAVENOUS
  Filled 2021-03-23: qty 2

## 2021-03-23 MED ORDER — CEFAZOLIN SODIUM-DEXTROSE 2-4 GM/100ML-% IV SOLN
2.0000 g | Freq: Two times a day (BID) | INTRAVENOUS | Status: DC
  Administered 2021-03-23: 2 g via INTRAVENOUS
  Filled 2021-03-23: qty 100

## 2021-03-23 MED ORDER — BISACODYL 10 MG RE SUPP
10.0000 mg | Freq: Every day | RECTAL | Status: DC
  Administered 2021-03-23 – 2021-03-24 (×2): 10 mg via RECTAL
  Filled 2021-03-23 (×4): qty 1

## 2021-03-23 NOTE — Evaluation (Signed)
Clinical/Bedside Swallow Evaluation Patient Details  Name: Gerald Nelson MRN: [id number] Date of Birth: 1951-06-05  Today's Date: 03/23/2021 Time: SLP Start Time (ACUTE ONLY): 16 SLP Stop Time (ACUTE ONLY): 1035 SLP Time Calculation (min) (ACUTE ONLY): 15 min  Past Medical History:  Past Medical History:  Diagnosis Date   Apical mural thrombus    a. question of apical thrombus on echo 01/2017, pt left AMA, not felt to be anticoag candidate with noncompliance.   Arthritis    CHF (congestive heart failure) (HCC)    Chronic systolic CHF (congestive heart failure) ([place])    a. pt refused cath. EF 15% 01/2017.   CKD (chronic kidney disease), stage III (HCC)    COPD (chronic obstructive pulmonary disease) (HCC)    Hernia, hiatal    Hyperlipidemia    Hypertension    Hyponatremia    Mitral regurgitation    a. mod-severe by echo 01/2017.   Pulmonary hypertension (HCC)    Schizophrenia ([place])    Tobacco abuse    Past Surgical History:  Past Surgical History:  Procedure Laterality Date   PENILE PROSTHESIS IMPLANT     HPI:  Patient is a 70 year old male who presentedto the hosptial with shortness of breath. patietn was found to have COPD exacerbation, acute renal failure and hypotension. PMH: CVA,HTN, CHF, COPD    Assessment / Plan / Recommendation  Clinical Impression  Patient presents with a mild oropharyngeal dysphagia. During oral phase, patient exhibited decreased mastication and oral transit of regular solids with trace oral residuals post initial swallow. He exhibited timely swallow initiation with intake of successive cup and straw sips of thin liquids (water) without immediate cough or throat clearing. He did exhibit a productive cough approximately 5 minutes after PO intake. Voice remained clear throughout session. SLP plans to see patient again later today, schedule permitting, when he is having a meal. SLP Visit Diagnosis: Dysphagia, unspecified (R13.10)    Aspiration Risk   Mild aspiration risk    Diet Recommendation Dysphagia 3 (Mech soft);Thin liquid   Liquid Administration via: Cup;Straw Medication Administration: Whole meds with puree Supervision: Patient able to self feed;Intermittent supervision to cue for compensatory strategies Compensations: Minimize environmental distractions;Slow rate;Small sips/bites Postural Changes: Seated upright at 90 degrees    Other  Recommendations Oral Care Recommendations: Oral care BID    Recommendations for follow up therapy are one component of a multi-disciplinary discharge planning process, led by the attending physician.  Recommendations may be updated based on patient status, additional functional criteria and insurance authorization.  Follow up Recommendations None      Frequency and Duration min 1 x/week  1 week       Prognosis        Swallow Study   General Date of Onset: 03/23/21 HPI: Patient is a 69 year old male who presentedto the hosptial with shortness of breath. patietn was found to have COPD exacerbation, acute renal failure and hypotension. PMH: CVA,HTN, CHF, COPD Type of Study: Bedside Swallow Evaluation Previous Swallow Assessment: None found Diet Prior to this Study: Dysphagia 3 (soft);Thin liquids Temperature Spikes Noted: No Respiratory Status: Room air History of Recent Intubation: No Behavior/Cognition: Alert;Cooperative;Confused Oral Cavity Assessment: Dried secretions Oral Care Completed by SLP: Yes Oral Cavity - Dentition: Missing dentition;Poor condition Vision: Functional for self-feeding Self-Feeding Abilities: Able to feed self Patient Positioning: Upright in chair Baseline Vocal Quality: Normal Volitional Cough: Strong Volitional Swallow: Able to elicit    Oral/Motor/Sensory Function Overall Oral Motor/Sensory Function: Within  functional limits   Ice Chips     Thin Liquid Thin Liquid: Impaired Presentation: Straw;Self Fed;Cup Pharyngeal  Phase Impairments: Cough -  Delayed Other Comments: one very delayed cough which was productive (approximately 5 minutes after PO intake)    Nectar Thick     Honey Thick     Puree Puree: Within functional limits   Solid     Solid: Impaired Oral Phase Functional Implications: Prolonged oral transit;Impaired mastication;Oral residue      Sonia Baller, MA, CCC-SLP Speech Therapy

## 2021-03-23 NOTE — Progress Notes (Signed)
Speech Language Pathology Treatment: Dysphagia  Patient Details Name: Gerald Nelson MRN: [id number] DOB: [birth year] Today's Date: 03/23/2021 Time: [id number] SLP Time Calculation (min) (ACUTE ONLY): 15 min  Assessment / Plan / Recommendation Clinical Impression  Patient seen in PM to observe during lunch meal. When SLP arrived, patient had already completed majority of food on lunch tray but then consumed some dysphagia 3 solids and thin liquids while SLP in room. Patient exhibited delayed coughing and throat clearing that were both more frequent than during BSE this morning. No productive coughing observed but patient did present with some mild wheezing. Voice remained clear and several minutes after PO intake, no further coughing or throat clearing observed. SLP to f/u with patient to ensure diet toleration and determine if objective swallow test is recommended.    HPI HPI: Patient is a 70 year old male who presentedto the hosptial with shortness of breath. patietn was found to have COPD exacerbation, acute renal failure and hypotension. PMH: CVA,HTN, CHF, COPD      SLP Plan  Continue with current plan of care      Recommendations for follow up therapy are one component of a multi-disciplinary discharge planning process, led by the attending physician.  Recommendations may be updated based on patient status, additional functional criteria and insurance authorization.    Recommendations  Diet recommendations: Dysphagia 3 (mechanical soft);Thin liquid Liquids provided via: Cup;Straw Medication Administration: Whole meds with puree Supervision: Patient able to self feed;Intermittent supervision to cue for compensatory strategies Compensations: Minimize environmental distractions;Slow rate;Small sips/bites Postural Changes and/or Swallow Maneuvers: Seated upright 90 degrees                Oral Care Recommendations: Oral care BID Follow up Recommendations: None SLP Visit Diagnosis:  Dysphagia, unspecified (R13.10) Plan: Continue with current plan of care       Sonia Baller, MA, CCC-SLP Speech Therapy   03/23/2021, 3:14 PM

## 2021-03-23 NOTE — Evaluation (Signed)
Occupational Therapy Evaluation Patient Details Name: Gerald Nelson MRN: [id number] DOB: 1950-11-15 Today's Date: 03/23/2021   History of Present Illness Patient is a 70 year old male who presentedto the hosptial with shortness of breath. patietn was found to have COPD exacerbation, acute renal failure and hypotension. PMH: CVA,HTN, CHF, COPD   Clinical Impression   Patient is a 70 year old male who was admitted for above. Patient was living at home alone prior level. Patient currently needs min A for standing balance tasks with increased fatigue and SOB with minimal activity noted.  Patient was noted to have decreased functional activity tolerance, decreased endurance and decreased standing balance impacting ability to engage in ADLs. Patient would continue to benefit from skilled OT services at this time while admitted and after d/c to address noted deficits in order to improve overall safety and independence in ADLs.     Recommendations for follow up therapy are one component of a multi-disciplinary discharge planning process, led by the attending physician.  Recommendations may be updated based on patient status, additional functional criteria and insurance authorization.   Follow Up Recommendations  SNF;Supervision/Assistance - 24 hour;Home health OT    Equipment Recommendations  None recommended by OT    Recommendations for Other Services       Precautions / Restrictions Precautions Precautions: Fall Restrictions Weight Bearing Restrictions: No      Mobility Bed Mobility Overal bed mobility: Needs Assistance Bed Mobility: Supine to Sit     Supine to sit: Supervision;HOB elevated          Transfers Overall transfer level: Needs assistance Equipment used: None Transfers: Sit to/from Stand;Stand Pivot Transfers Sit to Stand: Min guard Stand pivot transfers: Min assist            Balance Overall balance assessment: Needs assistance Sitting-balance support: No  upper extremity supported;Feet supported Sitting balance-Leahy Scale: Fair       Standing balance-Leahy Scale: Poor                             ADL either performed or assessed with clinical judgement   ADL Overall ADL's : Needs assistance/impaired Eating/Feeding: NPO   Grooming: Wash/dry face;Oral care;Set up;Sitting Grooming Details (indicate cue type and reason): on edge of bed Upper Body Bathing: Set up;Sitting   Lower Body Bathing: Sitting/lateral leans;Moderate assistance   Upper Body Dressing : Set up;Sitting   Lower Body Dressing: Sit to/from stand;Minimal assistance Lower Body Dressing Details (indicate cue type and reason): patient was noted to has posterior leaning when intial standing and during turns. Toilet Transfer: Minimal Print production planner Details (indicate cue type and reason): to transfer to recliner in room with LOB noted with min A to maintain standing balance. Toileting- Clothing Manipulation and Hygiene: Minimal assistance;Sit to/from stand       Functional mobility during ADLs: Min guard       Vision Patient Visual Report: No change from baseline Additional Comments: per patient report no change     Perception     Praxis      Pertinent Vitals/Pain Pain Assessment: No/denies pain     Hand Dominance Right   Extremity/Trunk Assessment Upper Extremity Assessment Upper Extremity Assessment: Generalized weakness (patient was noted to have enlarged MCPs on thumb, and 1-2nd digits with no pain associated with movement per patient report. patient was noted to have deficits with with grasping sock to turn it while on foot.)   Lower  Extremity Assessment Lower Extremity Assessment: Defer to PT evaluation       Communication Communication Communication: No difficulties   Cognition Arousal/Alertness: Awake/alert Behavior During Therapy: Flat affect Overall Cognitive Status: No family/caregiver present to determine baseline  cognitive functioning                                 General Comments: patient was noted to have minimal responses to questions.   General Comments       Exercises     Shoulder Instructions      Home Living Family/patient expects to be discharged to:: Private residence Living Arrangements: Alone Available Help at Discharge: Friend(s);Available PRN/intermittently Type of Home: House Home Access: Stairs to enter [employer] of Steps: 3-4 Entrance Stairs-Rails: None Home Layout: One level     Bathroom Shower/Tub: Teacher, early years/pre: Standard     Home Equipment: None   Additional Comments: Rents a room       Prior Functioning/Environment Level of Independence: Independent        Comments: pt reports pharmacist fills pillbox for him;no falls indicated. patient reported cooking for himself        OT Problem List: Decreased strength;Impaired balance (sitting and/or standing);Decreased activity tolerance;Decreased safety awareness;Decreased knowledge of use of DME or AE      OT Treatment/Interventions: Self-care/ADL training;Therapeutic exercise;Energy conservation;DME and/or AE instruction;Therapeutic activities;Balance training;Patient/family education    OT Goals(Current goals can be found in the care plan section) Acute Rehab OT Goals Patient Stated Goal: none stated OT Goal Formulation: With patient Time For Goal Achievement: 04/06/21 Potential to Achieve Goals: Good  OT Frequency: Min 2X/week   Barriers to D/C:    patient lives alone       Co-evaluation PT/OT/SLP Co-Evaluation/Treatment: Yes Reason for Co-Treatment: To address functional/ADL transfers PT goals addressed during session: Mobility/safety with mobility OT goals addressed during session: ADL's and self-care      AM-PAC OT "6 Clicks" Daily Activity     Outcome Measure Help from another person eating meals?: Total (NPO) Help from another person  taking care of personal grooming?: A Little Help from another person toileting, which includes using toliet, bedpan, or urinal?: A Little Help from another person bathing (including washing, rinsing, drying)?: A Little Help from another person to put on and taking off regular upper body clothing?: A Little Help from another person to put on and taking off regular lower body clothing?: A Little 6 Click Score: 16   End of Session Equipment Utilized During Treatment: Gait belt  Activity Tolerance: Patient tolerated treatment well Patient left: in chair;with call bell/phone within reach;with chair alarm set  OT Visit Diagnosis: Unsteadiness on feet (R26.81);Muscle weakness (generalized) (M62.81)                Time: [id number] OT Time Calculation (min): 11 min Charges:  OT General Charges $OT Visit: 1 Visit OT Evaluation $OT Eval Low Complexity: 1 Low  Jackelyn Poling OTR/L, MS Acute Rehabilitation Department Office# [phone] Pager# [phone]   [place] 03/23/2021, 12:52 PM

## 2021-03-23 NOTE — Progress Notes (Addendum)
PHARMACY NOTE:  ANTIMICROBIAL RENAL DOSAGE ADJUSTMENT  Current antimicrobial regimen includes a mismatch between antimicrobial dosage and estimated renal function.  As per policy approved by the Pharmacy & Therapeutics and Medical Executive Committees, the antimicrobial dosage will be adjusted accordingly.  Current antimicrobial dosage:  Unasyn 3g IV q6 hours  Indication: aspiration pneumonia/possible Ecoli bacteremia  Renal Function:  Estimated Creatinine Clearance: 23.8 mL/min (A) (by C-G formula based on SCr of 2.58 mg/dL (H)). '[]'$      On intermittent HD, scheduled: '[]'$      On CRRT    Antimicrobial dosage has been changed to:  Unasyn 3g IV q12 hours  Additional comments:   Thank you for allowing pharmacy to be a part of this patient's care.  Dimple Nanas, [facility] 03/23/2021 3:36 PM

## 2021-03-23 NOTE — Progress Notes (Signed)
PROGRESS NOTE    Gerald Nelson  [id number] DOB: Oct 30, 1950 DOA: 03/20/2021 PCP: [name], MD    Chief Complaint  Patient presents with   Shortness of Breath    Brief Narrative:  Patient is a 70 year old gentleman history of COPD with ongoing tobacco use, prior CVA, hypertension, CHF, CKD 3, history of mitral regurg presented to the ED with hypoxia, noted to be hypotensive, noted to have a leukocytosis.  Patient placed on empiric IV antibiotics, pancultured.  Patient placed on IV fluids which aided with hypotension.  And D-dimer noted to be elevated at approximately 6.  Patient being treated for COPD exacerbation.  VQ scan ordered and pending.  Lower extremity Dopplers negative for DVT.  Patient placed empirically on IV heparin pending VQ scan results.  IV antibiotic coverage broadened.   Assessment & Plan:   Principal Problem:   Acute respiratory failure with hypoxia (HCC) Active Problems:   COPD exacerbation (HCC)   E coli bacteremia   Hypotension   Leukocytosis   Schizophrenia (HCC)   Cerebral infarction ([place])   CKD (chronic kidney disease), stage III (HCC)   Essential hypertension   CHF (congestive heart failure) (HCC)   Tobacco abuse   Mitral regurgitation   Hyperlipidemia   Pulmonary hypertension (HCC)   Stage 3 severe COPD by GOLD classification ([place])   Acute renal failure (ARF) (HCC)   Constipation   Aspiration pneumonia due to gastric secretions (HCC)  #1 acute respiratory failure with hypoxia felt secondary to acute COPD exacerbation.  Not on home O2. -Patient admitted with hypoxia, shortness of breath, leukocytosis and noted to be hypotensive requiring BiPAP on the evening of admission. -Currently on room air with sats of 93%. -D-dimer noted to be elevated at 6.05 and patient noted to be hypotensive on admission and as such VQ scan was obtained with very low probability for PE -Patient with ongoing expiratory wheezing. -Patient on IV Solu-Medrol 60 mg  daily.  We will give an extra dose of Solu-Medrol 60 mg IV x1 this afternoon. -Continue scheduled duo nebs, Pulmicort nebs, PPI, Claritin, Flonase, IV antibiotics.. -Tobacco cessation stressed to patient. -Follow-up..   2.  Acute renal failure on chronic kidney disease stage IIIb -Likely secondary to prerenal azotemia in the setting of diuretics, ARB, ARB, spironolactone.  Patient also noted to be hypotensive on admission. -Creatinine on admission as high as 4.50. -Baseline creatinine 1.6-1.9. -Urinalysis nitrite negative, leukocytes negative, negative for protein.  Urine sodium of 12.  Urine creatinine of 70.29.   -Creatinine trending down currently at 2.58.    -Urine output of 2.450 L over the past 24 hours.  -Renal ultrasound with lateral echogenic kidneys likely reflecting CKD, no evidence of hydronephrosis, area of parenchymal irregularity and heterogenicity along lower pole of left kidney not well visualized likely heterogenicity of renal parenchyma follow-up ultrasound, bladder wall irregularity with component of bladder wall thickening some degree of underlying cystitis cannot be ruled out.   -Urine cultures with no growth. -Continue to hold diuretics, ARB.   -Decrease IV fluids to normal saline 75 cc an hour.   -Strict I's and O's.   -Daily weights.   -Follow.   3.  Hypotension -Likely secondary to volume depletion versus infectious etiology. -Blood pressure has responded to IV fluids.   -VQ scan with low probability for PE.   -Blood pressure improved.   -Continue empiric IV antibiotics.   -Follow.  4.  Abnormal TSH -Free T4 within normal limits. -Outpatient follow-up with repeat studies  in 4 to 6 weeks.  5.  Leukocytosis/concern for sepsis ruled out/ E. coli bacteremia/CAP versus aspiration pneumonia -Likely secondary to E. coli bacteremia.  1/3 blood cultures positive for E. coli. -Patient had presented with hypotension which has responded to IV fluids, altered mental  status with clinical improvement, acute respiratory failure with hypoxia, significant leukocytosis with white count of 23.4 on admission. -Patient improving clinically, blood pressure responded to IV fluids. -Leukocytosis trending down to 15.1 with steroid taper. -Patient afebrile. -Urinalysis nitrite negative, leukocytes negative. -Urine cultures negative. -Lower extremity Dopplers negative for DVT. -VQ scan with low probability for PE. -IV antibiotics were narrowed down to IV Ancef early on this morning. -Patient with some complaints of shortness of breath earlier this morning as such chest x-ray done concerning for left lower lobe pneumonia and concerning for possible aspiration as patient with complaints of difficulty swallowing and RN stating patient with coughing episodes with oral intake. -Check a urine Legionella antigen, urine pneumococcus antigen. -Change IV Ancef to IV Unasyn, add IV azithromycin. -Continue bronchodilators. -SLP evaluation. -Decrease IV fluids to 75 cc an hour. -Supportive care.  6.  Ongoing tobacco use -Continue nicotine patch.   -Tobacco cessation stressed to patient.    7.  Hyperlipidemia -Statin.  8.  History of mitral regurgitation -Monitor. -Outpatient follow-up with cardiology.  9.  Pulmonary hypertension  10.  History of CVA -Continue aspirin, statin.  11.  History of schizophrenia -Continue home regimen Haldol.    12.  History of CHF -Patient noted to have had a EF as low as 15% but most recent 2D echo from 04/01/2020 with a EF of 55%,NWMA, moderate LVH, grade 1 DD, normal mitral valve structure, no mitral valve regurgitation, no evidence of mitral stenosis. -Continue to hold spironolactone, Lasix, ARB due to presentation with hypotension and acute renal failure.   -On Zebeta.   -Resume BiDil. -Outpatient follow-up with cardiology.   13.  Hypertension -Due to presentation with hypotension antihypertensive medications on hold except  Zebeta. -Decrease IV fluids to 75 cc an hour.   -Resume BiDil.  -Follow.     DVT prophylaxis: Heparin Code Status: Full Family Communication: Updated patient.  No family at bedside. Disposition:   Status is: Inpatient  Remains inpatient appropriate because:Inpatient level of care appropriate due to severity of illness  Dispo: The patient is from: Home              Anticipated d/c is to: TBD              Patient currently is not medically stable to d/c.   Difficult to place patient No       Consultants:  None  Procedures:  Lower extremity Dopplers 03/21/2021 Chest x-ray 03/20/2021, 03/23/2021 VQ scan 03/21/2021 Renal ultrasound 03/21/2021    Antimicrobials: IV Rocephin 03/20/2021>>>> 03/21/2021 IV cefepime 03/21/2021>>>>> 03/22/2021 IV Rocephin 03/22/2021>>>>> 03/24/2019 IV Ancef x1 dose 03/23/2021 -IV Unasyn 03/23/2021>>>>> IV azithromycin 03/23/2021   Subjective: Sitting in chair sleeping. Easily arousable. States overall SOB improving. Stating he wants to be able to swallow. No CP. No ABD pain.,  Objective: Vitals:   03/23/21 1239 03/23/21 1800 03/23/21 1831 03/23/21 1928  BP: (!) 168/90   (!) 149/94  Pulse: (!) 59  (!) 48 78  Resp: 18   18  Temp: 97.7 F (36.5 C)   97.8 F (36.6 C)  TempSrc: Oral   Oral  SpO2: (!) 87%  99% 100%  Weight:  62.6 kg    Height:  Intake/Output Summary (Last 24 hours) at 03/23/2021 2037 Last data filed at 03/23/2021 1819 Gross per 24 hour  Intake 3748.33 ml  Output 2500 ml  Net 1248.33 ml   Filed Weights   03/22/21 1816 03/23/21 0500 03/23/21 1800  Weight: 60.9 kg 64.3 kg 62.6 kg    Examination:  General exam: NAD. Respiratory system: Diffuse exp wheezing.  No crackles.  No rhonchi.  Speaking in full sentences.  Fair air movement.   Cardiovascular system: RRR no murmurs rubs or gallops.  No JVD.  No lower extremity edema.   Gastrointestinal system: Abdomen is soft, nontender, nondistended, positive bowel sounds.  No  rebound.  No guarding. Central nervous system: Alert and oriented.  Moving extremities spontaneously.  No focal neurological deficits.  Extremities: Symmetric 5 x 5 power. Skin: No rashes, lesions or ulcers Psychiatry: Judgement and insight appear normal. Mood & affect appropriate.     Data Reviewed: I have personally reviewed following labs and imaging studies  CBC: Recent Labs  Lab 03/20/21 1628 03/20/21 1717 03/21/21 0252 03/22/21 0303 03/23/21 0424  WBC 23.4*  --  19.9* 20.2* 15.1*  NEUTROABS 19.8*  --   --  16.5* 11.3*  HGB 11.2* 10.9* 10.7* 10.7* 9.8*  HCT 32.1* 32.0* 30.5* 30.6* 27.8*  MCV 85.6  --  84.5 84.3 84.0  PLT 111*  --  99* 96* 79*    Basic Metabolic Panel: Recent Labs  Lab 03/20/21 1628 03/20/21 1717 03/21/21 0252 03/22/21 0303 03/23/21 0424  NA 137 136 139 145 141  K 4.4 4.6 4.7 4.2 4.0  CL 102 105 107 115* 113*  CO2 22  --  16* 20* 19*  GLUCOSE 118* 109* 120* 159* 117*  BUN 95* 88* 84* 82* 66*  CREATININE 4.38* 4.50* 3.64* 2.97* 2.58*  CALCIUM 8.8*  --  8.7* 8.6* 8.1*  MG  --   --   --  2.6* 2.1  PHOS  --   --   --   --  2.5    GFR: Estimated Creatinine Clearance: 23.8 mL/min (A) (by C-G formula based on SCr of 2.58 mg/dL (H)).  Liver Function Tests: Recent Labs  Lab 03/20/21 1628 03/21/21 0252 03/22/21 0303 03/23/21 0424  AST '29 27 18  '$ --   ALT '23 22 19  '$ --   ALKPHOS 76 59 62  --   BILITOT 1.7* 1.2 0.7  --   PROT 6.8 6.4* 6.1*  --   ALBUMIN 2.9* 2.8* 2.5* 2.4*    CBG: No results for input(s): GLUCAP in the last 168 hours.   Recent Results (from the past 240 hour(s))  Blood culture (routine x 2)     Status: Abnormal   Collection Time: 03/20/21  4:29 PM   Specimen: BLOOD RIGHT ARM  Result Value Ref Range Status   Specimen Description   Final    BLOOD RIGHT ARM Performed at [facility], [place] [address], [place], [place] [address]    Special Requests   Final    BOTTLES DRAWN AEROBIC AND ANAEROBIC Blood  Culture adequate volume Performed at [facility] [address] [place], [place] [address]    Culture  Setup Time   Final    GRAM NEGATIVE RODS IN BOTH AEROBIC AND ANAEROBIC BOTTLES CRITICAL RESULT CALLED TO, READ BACK BY AND VERIFIED WITH: PHARM D C.SHADE ON [id number] AT 1135 BY E.PARRISH Performed at [facility], [place] [address] [place], [place] [address]    Culture ESCHERICHIA  COLI (A)  Final   Report Status 03/23/2021 FINAL  Final   Organism ID, Bacteria ESCHERICHIA COLI  Final      Susceptibility   Escherichia coli - MIC*    AMPICILLIN <=2 SENSITIVE Sensitive     CEFAZOLIN <=4 SENSITIVE Sensitive     CEFEPIME <=0.12 SENSITIVE Sensitive     CEFTAZIDIME <=1 SENSITIVE Sensitive     CEFTRIAXONE <=0.25 SENSITIVE Sensitive     CIPROFLOXACIN <=0.25 SENSITIVE Sensitive     GENTAMICIN <=1 SENSITIVE Sensitive     IMIPENEM <=0.25 SENSITIVE Sensitive     TRIMETH/SULFA <=20 SENSITIVE Sensitive     AMPICILLIN/SULBACTAM <=2 SENSITIVE Sensitive     PIP/TAZO <=4 SENSITIVE Sensitive     * ESCHERICHIA COLI  Blood Culture ID Panel (Reflexed)     Status: Abnormal   Collection Time: 03/20/21  4:29 PM  Result Value Ref Range Status   Enterococcus faecalis NOT DETECTED NOT DETECTED Final   Enterococcus Faecium NOT DETECTED NOT DETECTED Final   Listeria monocytogenes NOT DETECTED NOT DETECTED Final   Staphylococcus species NOT DETECTED NOT DETECTED Final   Staphylococcus aureus (BCID) NOT DETECTED NOT DETECTED Final   Staphylococcus epidermidis NOT DETECTED NOT DETECTED Final   Staphylococcus lugdunensis NOT DETECTED NOT DETECTED Final   Streptococcus species NOT DETECTED NOT DETECTED Final   Streptococcus agalactiae NOT DETECTED NOT DETECTED Final   Streptococcus pneumoniae NOT DETECTED NOT DETECTED Final   Streptococcus pyogenes NOT DETECTED NOT DETECTED Final   A.calcoaceticus-baumannii NOT DETECTED NOT DETECTED Final   Bacteroides fragilis NOT DETECTED NOT  DETECTED Final   Enterobacterales DETECTED (A) NOT DETECTED Final    Comment: Enterobacterales represent a large order of gram negative bacteria, not a single organism. CRITICAL RESULT CALLED TO, READ BACK BY AND VERIFIED WITH: PHARM D C.SHADE ON [id number] AT 1135 BY E.PARRISH    Enterobacter cloacae complex NOT DETECTED NOT DETECTED Final   Escherichia coli DETECTED (A) NOT DETECTED Final    Comment: CRITICAL RESULT CALLED TO, READ BACK BY AND VERIFIED WITH: PHARM D C.SHADE ON [id number] AT 1135 BY E.PARRISH    Klebsiella aerogenes NOT DETECTED NOT DETECTED Final   Klebsiella oxytoca NOT DETECTED NOT DETECTED Final   Klebsiella pneumoniae NOT DETECTED NOT DETECTED Final   Proteus species NOT DETECTED NOT DETECTED Final   Salmonella species NOT DETECTED NOT DETECTED Final   Serratia marcescens NOT DETECTED NOT DETECTED Final   Haemophilus influenzae NOT DETECTED NOT DETECTED Final   Neisseria meningitidis NOT DETECTED NOT DETECTED Final   Pseudomonas aeruginosa NOT DETECTED NOT DETECTED Final   Stenotrophomonas maltophilia NOT DETECTED NOT DETECTED Final   Candida albicans NOT DETECTED NOT DETECTED Final   Candida auris NOT DETECTED NOT DETECTED Final   Candida glabrata NOT DETECTED NOT DETECTED Final   Candida krusei NOT DETECTED NOT DETECTED Final   Candida parapsilosis NOT DETECTED NOT DETECTED Final   Candida tropicalis NOT DETECTED NOT DETECTED Final   Cryptococcus neoformans/gattii NOT DETECTED NOT DETECTED Final   CTX-M ESBL NOT DETECTED NOT DETECTED Final   Carbapenem resistance IMP NOT DETECTED NOT DETECTED Final   Carbapenem resistance KPC NOT DETECTED NOT DETECTED Final   Carbapenem resistance NDM NOT DETECTED NOT DETECTED Final   Carbapenem resist OXA 48 LIKE NOT DETECTED NOT DETECTED Final   Carbapenem resistance VIM NOT DETECTED NOT DETECTED Final    Comment: Performed at [facility], [place] [address], [place],  [address]  Resp Panel by RT-PCR (Flu A&B,  Covid) Nasopharyngeal  Swab     Status: None   Collection Time: 03/20/21  7:28 PM   Specimen: Nasopharyngeal Swab; Nasopharyngeal(NP) swabs in vial transport medium  Result Value Ref Range Status   SARS Coronavirus 2 by RT PCR NEGATIVE NEGATIVE Final    Comment: (NOTE) SARS-CoV-2 target nucleic acids are NOT DETECTED.  The SARS-CoV-2 RNA is generally detectable in upper respiratory specimens during the acute phase of infection. The lowest concentration of SARS-CoV-2 viral copies this assay can detect is 138 copies/mL. A negative result does not preclude SARS-Cov-2 infection and should not be used as the sole basis for treatment or other patient management decisions. A negative result may occur with  improper specimen collection/handling, submission of specimen other than nasopharyngeal swab, presence of viral mutation(s) within the areas targeted by this assay, and inadequate number of viral copies(<138 copies/mL). A negative result must be combined with clinical observations, patient history, and epidemiological information. The expected result is Negative.  Fact Sheet for Patients:  EntrepreneurPulse.com.au  Fact Sheet for Healthcare Providers:  IncredibleEmployment.be  This test is no t yet approved or cleared by the [place] FDA and  has been authorized for detection and/or diagnosis of SARS-CoV-2 by FDA under an Emergency Use Authorization (EUA). This EUA will remain  in effect (meaning this test can be used) for the duration of the COVID-19 declaration under Section 564(b)(1) of the Act, 21 U.S.C.section 360bbb-3(b)(1), unless the authorization is terminated  or revoked sooner.       Influenza A by PCR NEGATIVE NEGATIVE Final   Influenza B by PCR NEGATIVE NEGATIVE Final    Comment: (NOTE) The Xpert Xpress SARS-CoV-2/FLU/RSV plus assay is intended as an aid in the diagnosis of influenza from Nasopharyngeal swab specimens and should  not be used as a sole basis for treatment. Nasal washings and aspirates are unacceptable for Xpert Xpress SARS-CoV-2/FLU/RSV testing.  Fact Sheet for Patients: EntrepreneurPulse.com.au  Fact Sheet for Healthcare Providers: IncredibleEmployment.be  This test is not yet approved or cleared by the [place] FDA and has been authorized for detection and/or diagnosis of SARS-CoV-2 by FDA under an Emergency Use Authorization (EUA). This EUA will remain in effect (meaning this test can be used) for the duration of the COVID-19 declaration under Section 564(b)(1) of the Act, 21 U.S.C. section 360bbb-3(b)(1), unless the authorization is terminated or revoked.  Performed at [facility], [place] [address] [place], [place] [address]   Blood culture (routine x 2)     Status: None (Preliminary result)   Collection Time: 03/20/21  9:46 PM   Specimen: BLOOD  Result Value Ref Range Status   Specimen Description   Final    BLOOD BLOOD RIGHT HAND Performed at [facility] [address], [place], [place] [address]    Special Requests   Final    BOTTLES DRAWN AEROBIC ONLY Blood Culture results may not be optimal due to an inadequate volume of blood received in culture bottles Performed at [facility] [address] [place], [place] [address]    Culture   Final    NO GROWTH 2 DAYS Performed at [facility] [address], [place], [place] [address]    Report Status PENDING  Incomplete  MRSA Next Gen by PCR, Nasal     Status: None   Collection Time: 03/20/21  9:58 PM   Specimen: Nasal Mucosa; Nasal Swab  Result Value Ref Range Status   MRSA by PCR Next Gen NOT DETECTED  NOT DETECTED Final    Comment: (NOTE) The GeneXpert MRSA Assay (FDA approved for NASAL specimens only), is one component of a comprehensive MRSA colonization surveillance program. It is not intended to diagnose MRSA  infection nor to guide or monitor treatment for MRSA infections. Test performance is not FDA approved in patients less than 58 years old. Performed at [facility], [place] [address] [place], [place] [address]   Urine Culture     Status: None   Collection Time: 03/21/21  9:58 AM   Specimen: Urine, Catheterized  Result Value Ref Range Status   Specimen Description   Final    URINE, CATHETERIZED Performed at [facility] [address] [place], [place] [address]    Special Requests   Final    NONE Performed at [facility], [place] [address], [place], [place] [address]    Culture   Final    NO GROWTH Performed at [facility], [place] [address], [place], [place] [address]    Report Status 03/22/2021 FINAL  Final         Radiology Studies: DG CHEST PORT 1 VIEW  Result Date: 03/23/2021 CLINICAL DATA:  70 year old male with history of shortness of breath. EXAM: PORTABLE CHEST 1 VIEW COMPARISON:  Chest x-ray 03/20/2021. FINDINGS: Ill-defined opacities are noted in the left lung base. Small left pleural effusion. Right lung is clear. No right pleural effusion. No pneumothorax. No evidence of pulmonary edema. Heart size is borderline enlarged. Upper mediastinal contours are within normal limits. Atherosclerotic calcifications in the thoracic aorta. IMPRESSION: 1. Ill-defined left lower lobe opacity concerning for probable bronchopneumonia. 2. Small left pleural effusion. 3. Aortic atherosclerosis. Electronically Signed   By: Vinnie Langton M.D.   On: 03/23/2021 15:11        Scheduled Meds:  amLODipine  5 mg Oral Daily   aspirin EC  81 mg Oral Daily   atorvastatin  40 mg Oral QPM   bisacodyl  10 mg Rectal Daily   bisoprolol  10 mg Oral Daily   budesonide (PULMICORT) nebulizer solution  0.5 mg Nebulization BID   chlorhexidine  15 mL Mouth Rinse BID   Chlorhexidine Gluconate Cloth  6 each Topical Q0600   fluticasone  2  spray Each Nare Daily   haloperidol  5 mg Oral BID   heparin injection (subcutaneous)  5,000 Units Subcutaneous Q8H   ipratropium-albuterol  3 mL Nebulization Q6H   isosorbide-hydrALAZINE  1 tablet Oral TID   loratadine  10 mg Oral Daily   mouth rinse  15 mL Mouth Rinse q12n4p   methylPREDNISolone (SOLU-MEDROL) injection  60 mg Intravenous Daily   nicotine  21 mg Transdermal Daily   pantoprazole  40 mg Oral Q0600   polyethylene glycol  17 g Oral BID   senna-docusate  1 tablet Oral BID   sodium bicarbonate  650 mg Oral TID   Continuous Infusions:  sodium chloride 75 mL/hr at 03/23/21 1816   sodium chloride Stopped (03/22/21 1100)   [START ON 03/24/2021] ampicillin-sulbactam (UNASYN) IV     azithromycin 500 mg (03/23/21 1819)     LOS: 3 days    Time spent: 40 minutes    Irine Seal, MD [facility]   To contact the attending provider between 7A-7P or the covering provider during after hours 7P-7A, please log into the web site [email or web address] and access using universal [place] password for that web site. If you do not have the password, please call  the hospital operator.  03/23/2021, 8:37 PM

## 2021-03-23 NOTE — Care Management Important Message (Signed)
Important Message  Patient Details IM Letter given to the Patient. Name: Gerald Nelson MRN: [id number] Date of Birth: 1951/06/07   Medicare Important Message Given:  Yes     Kerin Salen 03/23/2021, 12:25 PM

## 2021-03-23 NOTE — Progress Notes (Signed)
Pt is impulsive & forgetful when found 3 times thus far in this shift getting out of bed stating he needed to go to the bathroom. Bed alarm on and alerting [place] he was up.. explained to pt he has a foley catheter but pt appears to be constipated.  Pt also chokes / starts coughing when drinking water. Will need speech eval .

## 2021-03-23 NOTE — Evaluation (Addendum)
Physical Therapy Evaluation Patient Details Name: Gerald Nelson MRN: [id number] DOB: August 31, 1950 Today's Date: 03/23/2021  History of Present Illness  Patient is a 70 year old male who presentedto the hosptial with shortness of breath. patietn was found to have COPD exacerbation, acute renal failure and hypotension. PMH: CVA,HTN, CHF, COPD  Clinical Impression  The patient only answered yes/no,not sure of reliability of his  home arrangement, sounds like he lives in a boarding house on second level. Patient's gait unsteady. Patient may benefit from short term rehab. Unsure if he has any support at home.  Pt admitted with above diagnosis.  Pt currently with functional limitations due to the deficits listed below (see PT Problem List). Pt will benefit from skilled PT to increase their independence and safety with mobility to allow discharge to the venue listed below.        Recommendations for follow up therapy are one component of a multi-disciplinary discharge planning process, led by the attending physician.  Recommendations may be updated based on patient status, additional functional criteria and insurance authorization.  Follow Up Recommendations SNF depending on progress and support at home.    Equipment Recommendations  Rolling walker with 5" wheels (tba)    Recommendations for Other Services       Precautions / Restrictions Precautions Precautions: Fall Restrictions Weight Bearing Restrictions: No      Mobility  Bed Mobility Overal bed mobility: Needs Assistance Bed Mobility: Supine to Sit     Supine to sit: Supervision;HOB elevated     General bed mobility comments: extra time    Transfers Overall transfer level: Needs assistance Equipment used: None Transfers: Sit to/from [facility] Sit to Stand: Min guard Stand pivot transfers: Min assist       General transfer comment: pt unsteady   standing up  Ambulation/Gait Ambulation/Gait  assistance: Min assist Gait Distance (Feet): 20 Feet Assistive device: None Gait Pattern/deviations: Step-through pattern;Staggering left;Staggering right Gait velocity: decr   General Gait Details: did not use AD, gait unsteady.  Stairs            Wheelchair Mobility    Modified Rankin (Stroke Patients Only)       Balance Overall balance assessment: Needs assistance Sitting-balance support: No upper extremity supported;Feet supported Sitting balance-Leahy Scale: Fair     Standing balance support: No upper extremity supported Standing balance-Leahy Scale: Poor Standing balance comment: gait unsteady.                             Pertinent Vitals/Pain Pain Assessment: No/denies pain    Home Living Family/patient expects to be discharged to:: Private residence Living Arrangements: Alone Available Help at Discharge: Friend(s);Available PRN/intermittently Type of Home: House Home Access: Stairs to enter Entrance Stairs-Rails: None Entrance Stairs-Number of Steps: 3-4 Home Layout: One level Home Equipment: None Additional Comments: Rents a room     Prior Function Level of Independence: Independent         Comments: pt reports pharmacist fills pillbox for him;no falls indicated. patient reported cooking for himself, unsure of reliability of  information     Hand Dominance   Dominant Hand: Right    Extremity/Trunk Assessment   Upper Extremity Assessment Upper Extremity Assessment: Generalized weakness    Lower Extremity Assessment Lower Extremity Assessment: Generalized weakness    Cervical / Trunk Assessment Cervical / Trunk Assessment: Normal  Communication   Communication: No difficulties  Cognition Arousal/Alertness: Awake/alert Behavior During  Therapy: Flat affect Overall Cognitive Status: No family/caregiver present to determine baseline cognitive functioning                                 General Comments:  patient was noted to have minimal responses to questions. Stated I am here in this room."      General Comments      Exercises     Assessment/Plan    PT Assessment Patient needs continued PT services  PT Problem List Decreased strength;Decreased mobility;Decreased activity tolerance;Decreased balance;Decreased knowledge of use of DME;Decreased cognition       PT Treatment Interventions DME instruction;Therapeutic activities;Cognitive remediation;Gait training;Therapeutic exercise;Patient/family education;Functional mobility training    PT Goals (Current goals can be found in the Care Plan section)  Acute Rehab PT Goals Patient Stated Goal: none stated PT Goal Formulation: Patient unable to participate in goal setting Time For Goal Achievement: 04/06/21 Potential to Achieve Goals: Good    Frequency Min 3X/week   Barriers to discharge Decreased caregiver support      Co-evaluation PT/OT/SLP Co-Evaluation/Treatment: Yes Reason for Co-Treatment: To address functional/ADL transfers PT goals addressed during session: Mobility/safety with mobility OT goals addressed during session: ADL's and self-care       AM-PAC PT "6 Clicks" Mobility  Outcome Measure Help needed turning from your back to your side while in a flat bed without using bedrails?: None Help needed moving from lying on your back to sitting on the side of a flat bed without using bedrails?: None Help needed moving to and from a bed to a chair (including a wheelchair)?: A Little Help needed standing up from a chair using your arms (e.g., wheelchair or bedside chair)?: A Little Help needed to walk in hospital room?: A Little Help needed climbing 3-5 steps with a railing? : A Lot 6 Click Score: 19    End of Session   Activity Tolerance: Patient limited by fatigue Patient left: in chair;with call bell/phone within reach;with chair alarm set Nurse Communication: Mobility status PT Visit Diagnosis: Unsteadiness on  feet (R26.81)    Time: 1000-1028 PT Time Calculation (min) (ACUTE ONLY): 28 min   Charges:   PT Evaluation $PT Eval Low Complexity: [place] PT Acute Rehabilitation Services Pager [phone] Office [phone]   Claretha Cooper 03/23/2021, 1:40 PM

## 2021-03-24 DIAGNOSIS — N1832 Chronic kidney disease, stage 3b: Secondary | ICD-10-CM | POA: Diagnosis not present

## 2021-03-24 DIAGNOSIS — J441 Chronic obstructive pulmonary disease with (acute) exacerbation: Secondary | ICD-10-CM | POA: Diagnosis not present

## 2021-03-24 DIAGNOSIS — N179 Acute kidney failure, unspecified: Secondary | ICD-10-CM | POA: Diagnosis not present

## 2021-03-24 DIAGNOSIS — I10 Essential (primary) hypertension: Secondary | ICD-10-CM | POA: Diagnosis not present

## 2021-03-24 LAB — CBC WITH DIFFERENTIAL/PLATELET
Abs Immature Granulocytes: 1.3 10*3/uL — ABNORMAL HIGH (ref 0.00–0.07)
Basophils Absolute: 0.1 10*3/uL (ref 0.0–0.1)
Basophils Relative: 1 %
Eosinophils Absolute: 0 10*3/uL (ref 0.0–0.5)
Eosinophils Relative: 0 %
HCT: 24.9 % — ABNORMAL LOW (ref 39.0–52.0)
Hemoglobin: 8.8 g/dL — ABNORMAL LOW (ref 13.0–17.0)
Immature Granulocytes: 10 %
Lymphocytes Relative: 8 %
Lymphs Abs: 1.1 10*3/uL (ref 0.7–4.0)
MCH: 29.8 pg (ref 26.0–34.0)
MCHC: 35.3 g/dL (ref 30.0–36.0)
MCV: 84.4 fL (ref 80.0–100.0)
Monocytes Absolute: 0.9 10*3/uL (ref 0.1–1.0)
Monocytes Relative: 7 %
Neutro Abs: 9.8 10*3/uL — ABNORMAL HIGH (ref 1.7–7.7)
Neutrophils Relative %: 74 %
Platelets: 82 10*3/uL — ABNORMAL LOW (ref 150–400)
RBC: 2.95 MIL/uL — ABNORMAL LOW (ref 4.22–5.81)
RDW: 13.2 % (ref 11.5–15.5)
WBC: 13.1 10*3/uL — ABNORMAL HIGH (ref 4.0–10.5)
nRBC: 0 % (ref 0.0–0.2)

## 2021-03-24 LAB — HIV ANTIBODY (ROUTINE TESTING W REFLEX): HIV Screen 4th Generation wRfx: NONREACTIVE

## 2021-03-24 LAB — BASIC METABOLIC PANEL
Anion gap: 8 (ref 5–15)
BUN: 54 mg/dL — ABNORMAL HIGH (ref 8–23)
CO2: 21 mmol/L — ABNORMAL LOW (ref 22–32)
Calcium: 8 mg/dL — ABNORMAL LOW (ref 8.9–10.3)
Chloride: 110 mmol/L (ref 98–111)
Creatinine, Ser: 1.95 mg/dL — ABNORMAL HIGH (ref 0.61–1.24)
GFR, Estimated: 37 mL/min — ABNORMAL LOW (ref >60)
Glucose, Bld: 127 mg/dL — ABNORMAL HIGH (ref 70–99)
Potassium: 4 mmol/L (ref 3.5–5.1)
Sodium: 139 mmol/L (ref 135–145)

## 2021-03-24 MED ORDER — SODIUM CHLORIDE 0.9 % IV SOLN
3.0000 g | Freq: Four times a day (QID) | INTRAVENOUS | Status: DC
  Administered 2021-03-24 – 2021-03-26 (×9): 3 g via INTRAVENOUS
  Filled 2021-03-24 (×12): qty 8

## 2021-03-24 MED ORDER — AMLODIPINE BESYLATE 5 MG PO TABS
5.0000 mg | ORAL_TABLET | Freq: Once | ORAL | Status: AC
  Administered 2021-03-24: 5 mg via ORAL
  Filled 2021-03-24: qty 1

## 2021-03-24 MED ORDER — METHYLPREDNISOLONE SODIUM SUCC 125 MG IJ SOLR
60.0000 mg | Freq: Once | INTRAMUSCULAR | Status: AC
  Administered 2021-03-24: 60 mg via INTRAVENOUS
  Filled 2021-03-24: qty 2

## 2021-03-24 MED ORDER — AMLODIPINE BESYLATE 10 MG PO TABS
10.0000 mg | ORAL_TABLET | Freq: Every day | ORAL | Status: DC
  Administered 2021-03-25 – 2021-03-26 (×2): 10 mg via ORAL
  Filled 2021-03-24 (×2): qty 1

## 2021-03-24 MED ORDER — IPRATROPIUM-ALBUTEROL 0.5-2.5 (3) MG/3ML IN SOLN
3.0000 mL | Freq: Three times a day (TID) | RESPIRATORY_TRACT | Status: DC
  Administered 2021-03-25: 3 mL via RESPIRATORY_TRACT
  Filled 2021-03-24: qty 3

## 2021-03-24 NOTE — TOC Initial Note (Signed)
Transition of Care [facility]) - Initial/Assessment Note    Patient Details  Name: Gerald Nelson MRN: [id number] Date of Birth: 1951-03-09  Transition of [facility]) CM/SW Contact:    Trish Mage, LCSW Phone Number: 03/24/2021, 11:46 AM  Clinical Narrative:    Patient seen in follow up to PT recommendation of SNF.  Mr Heinicke states he lives by himself, initially said he as planning on returning home, but when I went into further detail of PT note, he agreed that short term reahb would be a good alternative.  Bed search process explained and initiated. Left message for sister. TOC will continue to follow during the course of hospitalization.                Expected Discharge Plan: Skilled Nursing Facility Barriers to Discharge: SNF Pending bed offer   Patient Goals and CMS Choice   CMS Medicare.gov Compare Post Acute Care list provided to:: Patient Choice offered to / list presented to : Patient  Expected Discharge Plan and Services Expected Discharge Plan: [place]   Discharge Planning Services: CM Consult Post Acute Care Choice: [place] Living arrangements for the past 2 months: Apartment                                      Prior Living Arrangements/Services Living arrangements for the past 2 months: Apartment Lives with:: Self Patient language and need for interpreter reviewed:: Yes        Need for Family Participation in Patient Care: Yes (Comment) Care giver support system in place?: Yes (comment)   Criminal Activity/Legal Involvement Pertinent to Current Situation/Hospitalization: No - Comment as needed  Activities of Daily Living Home Assistive Devices/Equipment: None ADL Screening (condition at time of admission) Patient's cognitive ability adequate to safely complete daily activities?: Yes Is the patient deaf or have difficulty hearing?: No Does the patient have difficulty seeing, even when wearing glasses/contacts?:  No Does the patient have difficulty concentrating, remembering, or making decisions?: No Patient able to express need for assistance with ADLs?: Yes Does the patient have difficulty dressing or bathing?: No Independently performs ADLs?: Yes (appropriate for developmental age) Does the patient have difficulty walking or climbing stairs?: No Weakness of Legs: None Weakness of Arms/Hands: None  Permission Sought/Granted Permission sought to share information with : Family Supports Permission granted to share information with : Yes, Verbal Permission Granted  Share Information with NAME: caldwell,alicia (Sister)   [id number]           Emotional Assessment Appearance:: Appears stated age Attitude/Demeanor/Rapport: Engaged Affect (typically observed): Appropriate Orientation: : Oriented to Self, Oriented to Place, Oriented to Situation Alcohol / Substance Use: Not Applicable Psych Involvement: No (comment)  Admission diagnosis:  Acute renal failure (ARF) ([place]) [N17.9] Patient Active Problem List   Diagnosis Date Noted   Constipation 03/23/2021   Aspiration pneumonia due to gastric secretions ([place])    E coli bacteremia 03/22/2021   Acute renal failure (ARF) ([place]) 03/20/2021   COPD exacerbation ([place]) 05/01/2020   Stage 3 severe COPD by GOLD classification ([place]) 11/14/2018   Noncompliance 04/23/2017   Mitral regurgitation 04/23/2017   Thrombus - possible apical thrombus 01/2017 04/23/2017   Hyperlipidemia 04/23/2017   Pulmonary hypertension ([place])    SOB (shortness of breath)    Palliative care by specialist    Tobacco abuse 04/19/2017   Acute respiratory failure with hypoxia (  [place]) 01/27/2017   Hypertensive urgency 01/27/2017   CHF (congestive heart failure) ([place]) 01/26/2017   CKD (chronic kidney disease), stage III ([place]) 01/04/2017   Essential hypertension 01/04/2017   Cerebral infarction [facility]) 12/12/2013   Hypotension 12/06/2012   Leukocytosis 12/06/2012   Hyponatremia  12/06/2012   Hiatal hernia 12/06/2012   Syncope 12/06/2012   Schizophrenia ([place])    PCP:  [name], MD Pharmacy:   [place], [place] - [address] [place] [address] Phone: [phone] Fax: [phone]     Social Determinants of Health (SDOH) Interventions    Readmission Risk Interventions No flowsheet data found.

## 2021-03-24 NOTE — Progress Notes (Addendum)
Mobility Specialist - Progress Note   03/24/21 1400  Oxygen Therapy  SpO2 94 %  O2 Device Nasal Cannula  O2 Flow Rate (L/min) 1 L/min  Mobility  Activity Ambulated in hall  Level of Assistance Contact guard assist, steadying assist  Assistive Device None  Distance Ambulated (ft) 60 ft  Mobility Ambulated with assistance in hallway  Mobility Response Tolerated well  Mobility performed by Mobility specialist;Other (comment) (Physical Therapist)  $Mobility charge 1 Mobility     Pre-mobility: 71 HR, 98% SpO2 During mobility: 92 HR, 94% SpO2 Post-mobility: 97% SPO2  Upon entry pt was agreeable to ambulate and was instructed to use RW when ambulating, but refused multiple times. Pt pushed away RW stating "I don't need it", but was eager to ambulate in hallway. Pt took very small steps when ambulating and stated it wasn't his normal, but provided him comfort. Pt began to wheeze when walking and was encouraged by PT and mobility specialist to practice pursed breathing but pt was unsuccessful. PT and mobility specialist helped pt return to room and back in bed. RN informed of session and pt's breathing pattern.   [place] Specialist Acute Rehabilitation Services Phone: [phone] 03/24/21, 2:46 PM

## 2021-03-24 NOTE — Progress Notes (Signed)
Speech Language Pathology Treatment: Dysphagia  Patient Details Name: Gerald Nelson MRN: [id number] DOB: Jul 01, 1951 Today's Date: 03/24/2021 Time: 3015-9968 SLP Time Calculation (min) (ACUTE ONLY): 26 min  Assessment / Plan / Recommendation Clinical Impression  SLP follow up to address dysphagia goals.  Lunch tray arrived to room and SLP assisted pt with set up.  Pt self fed meat, potato, and tea - no s/s of aspiration.    Pt denies any dysphagia - including esophageal.  Does have h/o hiatal hernia and potential esophageal thickening.   Therefore recommend continue precautions.   Educated pt to general precautions.       HPI HPI: Patient is a 70 year old male who presentedto the hosptial with shortness of breath. patietn was found to have COPD exacerbation, acute renal failure and hypotension. PMH: CVA,HTN, CHF, COPD      SLP Plan  All goals met      Recommendations for follow up therapy are one component of a multi-disciplinary discharge planning process, led by the attending physician.  Recommendations may be updated based on patient status, additional functional criteria and insurance authorization.    Recommendations  Liquids provided via: Cup;Straw Medication Administration: Whole meds with puree Supervision: Patient able to self feed;Intermittent supervision to cue for compensatory strategies Compensations: Minimize environmental distractions;Slow rate;Small sips/bites Postural Changes and/or Swallow Maneuvers: Seated upright 90 degrees                Oral Care Recommendations: Oral care BID Follow up Recommendations: None SLP Visit Diagnosis: Dysphagia, unspecified (R13.10) Plan: All goals met       GO              Kathleen Lime, MS [facility] SLP Acute Rehab Services Office [phone] Pager [phone]   [name]  03/24/2021, 1:05 PM

## 2021-03-24 NOTE — Progress Notes (Signed)
Pt went off to sleep shortly after giving trazodone & zofran - thus far the waist belt restraint has not been needed.

## 2021-03-24 NOTE — Progress Notes (Signed)
PHARMACY NOTE:  ANTIMICROBIAL RENAL DOSAGE ADJUSTMENT  Current antimicrobial regimen includes a mismatch between antimicrobial dosage and estimated renal function.  As per policy approved by the Pharmacy & Therapeutics and Medical Executive Committees, the antimicrobial dosage will be adjusted accordingly.  Current antimicrobial dosage:  Unasyn 3g IV q12 hours  Indication: aspiration pneumonia/possible Ecoli bacteremia  Renal Function:  Estimated Creatinine Clearance: 31.5 mL/min (A) (by C-G formula based on SCr of 1.95 mg/dL (H)). '[]'$      On intermittent HD, scheduled: '[]'$      On CRRT    Antimicrobial dosage has been changed to:  Unasyn 3g IV q6 hours  Additional comments:   Thank you for allowing pharmacy to be a part of this patient's care.  Royetta Asal, PharmD, BCPS 03/24/2021 7:29 AM

## 2021-03-24 NOTE — Progress Notes (Signed)
    OVERNIGHT PROGRESS REPORT  Notified by RN for continued interference with medical devices and attempting to get out of bed. Patient has received medications for anxiety etc.  SPO2, pain, and bathroom concerns are addressed.  Soft belt will be removed as soon as possible   Gerald Nelson MSNA MSN [place]

## 2021-03-24 NOTE — Progress Notes (Addendum)
PROGRESS NOTE    Gerald Nelson  [id number] DOB: 11-Dec-1950 DOA: 03/20/2021 PCP: [name], MD    Chief Complaint  Patient presents with   Shortness of Breath    Brief Narrative:  Patient is a 69 year old gentleman history of COPD with ongoing tobacco use, prior CVA, hypertension, CHF, CKD 3, history of mitral regurg presented to the ED with hypoxia, noted to be hypotensive, noted to have a leukocytosis.  Patient placed on empiric IV antibiotics, pancultured.  Patient placed on IV fluids which aided with hypotension.  And D-dimer noted to be elevated at approximately 6.  Patient being treated for COPD exacerbation.  VQ scan ordered and pending.  Lower extremity Dopplers negative for DVT.  Patient placed empirically on IV heparin pending VQ scan results.  IV antibiotic coverage broadened.   Assessment & Plan:   Principal Problem:   Acute respiratory failure with hypoxia (HCC) Active Problems:   COPD exacerbation (HCC)   E coli bacteremia   Hypotension   Leukocytosis   Schizophrenia (HCC)   Cerebral infarction ([facility])   CKD (chronic kidney disease), stage III (HCC)   Essential hypertension   CHF (congestive heart failure) (HCC)   Tobacco abuse   Mitral regurgitation   Hyperlipidemia   Pulmonary hypertension (HCC)   Stage 3 severe COPD by GOLD classification ([facility])   Acute renal failure (ARF) (HCC)   Constipation   Aspiration pneumonia due to gastric secretions (HCC)  1 acute respiratory failure with hypoxia felt secondary to acute COPD exacerbation.  Not on home O2. -Patient admitted with hypoxia, shortness of breath, leukocytosis and noted to be hypotensive requiring BiPAP on the evening of admission. -Currently on room air with sats of 93%. -D-dimer noted to be elevated at 6.05 and patient noted to be hypotensive on admission and as such VQ scan was obtained with very low probability for PE -Patient with ongoing expiratory wheezing. -Patient on IV Solu-Medrol 60 mg  daily.  We will give a second dose of IV Solu-Medrol 60 mg again this evening and reassess in the morning. -Continue scheduled duo nebs, Pulmicort nebs, PPI, Claritin, Flonase, IV antibiotics.. -Tobacco cessation stressed to patient. -Follow-up..   2.  Acute renal failure on chronic kidney disease stage IIIb -Likely secondary to prerenal azotemia in the setting of diuretics, ARB, ARB, spironolactone.  Patient also noted to be hypotensive on admission. -Creatinine on admission as high as 4.50. -Baseline creatinine 1.6-1.9. -Urinalysis nitrite negative, leukocytes negative, negative for protein.  Urine sodium of 12.  Urine creatinine of 70.29.   -Creatinine trending down currently at 1.95..    -Urine output of 2.9 L over the past 24 hours.  -Renal ultrasound with lateral echogenic kidneys likely reflecting CKD, no evidence of hydronephrosis, area of parenchymal irregularity and heterogenicity along lower pole of left kidney not well visualized likely heterogenicity of renal parenchyma follow-up ultrasound, bladder wall irregularity with component of bladder wall thickening some degree of underlying cystitis cannot be ruled out.   -Urine cultures with no growth. -Continue to hold diuretics, ARB.   -Decrease IV fluids to 50 cc an hour and subsequently discontinue IV fluids this evening.   -Strict I's and O's, daily weights.   -Follow.    3.  Hypotension -Likely secondary to volume depletion versus infectious etiology. -Blood pressure has responded to IV fluids.   -VQ scan with low probability for PE.   -Blood pressure improved.   -Continue empiric IV antibiotics.   -Saline lock IV fluids this evening.  Follow.  4.  Abnormal TSH -Free T4 within normal limits. -Outpatient follow-up with repeat studies in 4 to 6 weeks.  5.  Leukocytosis/concern for sepsis ruled out/ E. coli bacteremia/CAP versus aspiration pneumonia -Likely secondary to E. coli bacteremia.  1/3 blood cultures positive for E.  coli. -Patient had presented with hypotension which has responded to IV fluids, altered mental status with clinical improvement, acute respiratory failure with hypoxia, significant leukocytosis with white count of 23.4 on admission. -Patient improving clinically, blood pressure responded to IV fluids. -Leukocytosis trending down to 13.1 with steroid taper. -Patient afebrile. -Urinalysis nitrite negative, leukocytes negative. -Urine cultures negative. -Lower extremity Dopplers negative for DVT. -VQ scan with low probability for PE. -IV antibiotics were narrowed down to IV Ancef in the morning of 03/23/2021.  -Patient with some complaints of shortness of breath earlier on the morning of 03/23/2021, as such chest x-ray done concerning for left lower lobe pneumonia and concerning for possible aspiration as patient with complaints of difficulty swallowing and RN stating patient with coughing episodes with oral intake. -Urine Legionella antigen pending.  Urine pneumococcus antigen pending.   -IV Ancef changed to IV Unasyn and IV azithromycin added to regiment.  -Continue bronchodilators. -Patient assessed by SLP.   -Decrease IV fluids to 50 cc an hour and saline lock IV fluids this evening.  -Supportive care.    6.  Ongoing tobacco use -Continue nicotine patch.   -Tobacco cessation stressed to patient.    7.  Hyperlipidemia -Statin.  8.  History of mitral regurgitation -Monitor. -Outpatient follow-up with cardiology.  9.  Pulmonary hypertension  10.  History of CVA -Continue aspirin for stroke prophylaxis.  Continue statin.   11.  History of schizophrenia -Haldol.    12.  History of CHF -Patient noted to have had a EF as low as 15% but most recent 2D echo from 04/01/2020 with a EF of 55%,NWMA, moderate LVH, grade 1 DD, normal mitral valve structure, no mitral valve regurgitation, no evidence of mitral stenosis. -Continue to hold spironolactone, Lasix, ARB due to presentation with  hypotension and acute renal failure.   -Continue Zebeta and BiDil.   -Outpatient follow-up with cardiology.   13.  Hypertension -Due to presentation with hypotension antihypertensive medications on hold except Zebeta. -We will decrease IV fluids to 50 cc an hour and discontinue IV fluids this evening.   -Continue home regimen BiDil.   -Follow.     DVT prophylaxis: Heparin Code Status: Full Family Communication: Updated patient.  Updated sister on the telephone.  Disposition:   Status is: Inpatient  Remains inpatient appropriate because:Inpatient level of care appropriate due to severity of illness  Dispo: The patient is from: Home              Anticipated d/c is to: TBD              Patient currently is not medically stable to d/c.   Difficult to place patient No       Consultants:  None  Procedures:  Lower extremity Dopplers 03/21/2021 Chest x-ray 03/20/2021, 03/23/2021 VQ scan 03/21/2021 Renal ultrasound 03/21/2021    Antimicrobials: IV Rocephin 03/20/2021>>>> 03/21/2021 IV cefepime 03/21/2021>>>>> 03/22/2021 IV Rocephin 03/22/2021>>>>> 03/24/2019 IV Ancef x1 dose 03/23/2021 -IV Unasyn 03/23/2021>>>>> IV azithromycin 03/23/2021   Subjective: Sleeping but arousable.  Denies any chest pain.  Feels shortness of breath is improving.  No chest pain.  No abdominal pain.  Was assessed by speech therapy and feels he is tolerating his current  diet.    Objective: Vitals:   03/24/21 1159 03/24/21 1400 03/24/21 1513 03/24/21 1600  BP: 138/89   (!) 144/83  Pulse: 82   81  Resp: 20   16  Temp: 97.8 F (36.6 C)     TempSrc: Oral     SpO2: 96% 94% 94% 100%  Weight:      Height:        Intake/Output Summary (Last 24 hours) at 03/24/2021 1748 Last data filed at 03/24/2021 1158 Gross per 24 hour  Intake 2788.33 ml  Output 2300 ml  Net 488.33 ml    Filed Weights   03/23/21 0500 03/23/21 1800 03/24/21 0419  Weight: 64.3 kg 62.6 kg 69.1 kg    Examination:  General exam:  NAD. Respiratory system: Improving expiratory wheezes.  No crackles.  No rhonchi.  Speaking in full sentences.  Fair air movement. Cardiovascular system: Regular rate rhythm no murmurs rubs or gallops.  No JVD.  No lower extremity edema.   Gastrointestinal system: Abdomen is soft, nontender, nondistended, positive bowel sounds.  No rebound.  No guarding.  Central nervous system: Alert and oriented.  Moving extremities spontaneously.  No focal neurological deficits.   Extremities: Symmetric 5 x 5 power. Skin: No rashes, lesions or ulcers Psychiatry: Judgement and insight appear normal. Mood & affect appropriate.     Data Reviewed: I have personally reviewed following labs and imaging studies  CBC: Recent Labs  Lab 03/20/21 1628 03/20/21 1717 03/21/21 0252 03/22/21 0303 03/23/21 0424 03/24/21 0521  WBC 23.4*  --  19.9* 20.2* 15.1* 13.1*  NEUTROABS 19.8*  --   --  16.5* 11.3* 9.8*  HGB 11.2* 10.9* 10.7* 10.7* 9.8* 8.8*  HCT 32.1* 32.0* 30.5* 30.6* 27.8* 24.9*  MCV 85.6  --  84.5 84.3 84.0 84.4  PLT 111*  --  99* 96* 79* 82*     Basic Metabolic Panel: Recent Labs  Lab 03/20/21 1628 03/20/21 1717 03/21/21 0252 03/22/21 0303 03/23/21 0424 03/24/21 0521  NA 137 136 139 145 141 139  K 4.4 4.6 4.7 4.2 4.0 4.0  CL 102 105 107 115* 113* 110  CO2 22  --  16* 20* 19* 21*  GLUCOSE 118* 109* 120* 159* 117* 127*  BUN 95* 88* 84* 82* 66* 54*  CREATININE 4.38* 4.50* 3.64* 2.97* 2.58* 1.95*  CALCIUM 8.8*  --  8.7* 8.6* 8.1* 8.0*  MG  --   --   --  2.6* 2.1  --   PHOS  --   --   --   --  2.5  --      GFR: Estimated Creatinine Clearance: 31.5 mL/min (A) (by C-G formula based on SCr of 1.95 mg/dL (H)).  Liver Function Tests: Recent Labs  Lab 03/20/21 1628 03/21/21 0252 03/22/21 0303 03/23/21 0424  AST '29 27 18  '$ --   ALT '23 22 19  '$ --   ALKPHOS 76 59 62  --   BILITOT 1.7* 1.2 0.7  --   PROT 6.8 6.4* 6.1*  --   ALBUMIN 2.9* 2.8* 2.5* 2.4*     CBG: No results for  input(s): GLUCAP in the last 168 hours.   Recent Results (from the past 240 hour(s))  Blood culture (routine x 2)     Status: Abnormal   Collection Time: 03/20/21  4:29 PM   Specimen: BLOOD RIGHT ARM  Result Value Ref Range Status   Specimen Description   Final    BLOOD RIGHT ARM Performed at [facility]  [facility] [place] [address] [place], [place] [address]    Special Requests   Final    BOTTLES DRAWN AEROBIC AND ANAEROBIC Blood Culture adequate volume Performed at [facility] [address] [place], [place] [address]    Culture  Setup Time   Final    GRAM NEGATIVE RODS IN BOTH AEROBIC AND ANAEROBIC BOTTLES CRITICAL RESULT CALLED TO, READ BACK BY AND VERIFIED WITH: PHARM D C.SHADE ON [id number] AT 1135 BY E.PARRISH Performed at [facility], [place] [address], [place], [place] [address]    Culture ESCHERICHIA COLI (A)  Final   Report Status 03/23/2021 FINAL  Final   Organism ID, Bacteria ESCHERICHIA COLI  Final      Susceptibility   Escherichia coli - MIC*    AMPICILLIN <=2 SENSITIVE Sensitive     CEFAZOLIN <=4 SENSITIVE Sensitive     CEFEPIME <=0.12 SENSITIVE Sensitive     CEFTAZIDIME <=1 SENSITIVE Sensitive     CEFTRIAXONE <=0.25 SENSITIVE Sensitive     CIPROFLOXACIN <=0.25 SENSITIVE Sensitive     GENTAMICIN <=1 SENSITIVE Sensitive     IMIPENEM <=0.25 SENSITIVE Sensitive     TRIMETH/SULFA <=20 SENSITIVE Sensitive     AMPICILLIN/SULBACTAM <=2 SENSITIVE Sensitive     PIP/TAZO <=4 SENSITIVE Sensitive     * ESCHERICHIA COLI  Blood Culture ID Panel (Reflexed)     Status: Abnormal   Collection Time: 03/20/21  4:29 PM  Result Value Ref Range Status   Enterococcus faecalis NOT DETECTED NOT DETECTED Final   Enterococcus Faecium NOT DETECTED NOT DETECTED Final   Listeria monocytogenes NOT DETECTED NOT DETECTED Final   Staphylococcus species NOT DETECTED NOT DETECTED Final   Staphylococcus aureus (BCID) NOT DETECTED NOT DETECTED Final    Staphylococcus epidermidis NOT DETECTED NOT DETECTED Final   Staphylococcus lugdunensis NOT DETECTED NOT DETECTED Final   Streptococcus species NOT DETECTED NOT DETECTED Final   Streptococcus agalactiae NOT DETECTED NOT DETECTED Final   Streptococcus pneumoniae NOT DETECTED NOT DETECTED Final   Streptococcus pyogenes NOT DETECTED NOT DETECTED Final   A.calcoaceticus-baumannii NOT DETECTED NOT DETECTED Final   Bacteroides fragilis NOT DETECTED NOT DETECTED Final   Enterobacterales DETECTED (A) NOT DETECTED Final    Comment: Enterobacterales represent a large order of gram negative bacteria, not a single organism. CRITICAL RESULT CALLED TO, READ BACK BY AND VERIFIED WITH: PHARM D C.SHADE ON [id number] AT 1135 BY E.PARRISH    Enterobacter cloacae complex NOT DETECTED NOT DETECTED Final   Escherichia coli DETECTED (A) NOT DETECTED Final    Comment: CRITICAL RESULT CALLED TO, READ BACK BY AND VERIFIED WITH: PHARM D C.SHADE ON [id number] AT [id number] BY E.PARRISH    Klebsiella aerogenes NOT DETECTED NOT DETECTED Final   Klebsiella oxytoca NOT DETECTED NOT DETECTED Final   Klebsiella pneumoniae NOT DETECTED NOT DETECTED Final   Proteus species NOT DETECTED NOT DETECTED Final   Salmonella species NOT DETECTED NOT DETECTED Final   Serratia marcescens NOT DETECTED NOT DETECTED Final   Haemophilus influenzae NOT DETECTED NOT DETECTED Final   Neisseria meningitidis NOT DETECTED NOT DETECTED Final   Pseudomonas aeruginosa NOT DETECTED NOT DETECTED Final   Stenotrophomonas maltophilia NOT DETECTED NOT DETECTED Final   Candida albicans NOT DETECTED NOT DETECTED Final   Candida auris NOT DETECTED NOT DETECTED Final   Candida glabrata NOT DETECTED NOT DETECTED Final   Candida krusei NOT DETECTED NOT DETECTED Final   Candida parapsilosis NOT DETECTED NOT DETECTED Final   Candida tropicalis NOT  DETECTED NOT DETECTED Final   Cryptococcus neoformans/gattii NOT DETECTED NOT DETECTED Final   CTX-M ESBL NOT  DETECTED NOT DETECTED Final   Carbapenem resistance IMP NOT DETECTED NOT DETECTED Final   Carbapenem resistance KPC NOT DETECTED NOT DETECTED Final   Carbapenem resistance NDM NOT DETECTED NOT DETECTED Final   Carbapenem resist OXA 48 LIKE NOT DETECTED NOT DETECTED Final   Carbapenem resistance VIM NOT DETECTED NOT DETECTED Final    Comment: Performed at [facility], [place] [address], [place], [place] [address]  Resp Panel by RT-PCR (Flu A&B, Covid) Nasopharyngeal Swab     Status: None   Collection Time: 03/20/21  7:28 PM   Specimen: Nasopharyngeal Swab; Nasopharyngeal(NP) swabs in vial transport medium  Result Value Ref Range Status   SARS Coronavirus 2 by RT PCR NEGATIVE NEGATIVE Final    Comment: (NOTE) SARS-CoV-2 target nucleic acids are NOT DETECTED.  The SARS-CoV-2 RNA is generally detectable in upper respiratory specimens during the acute phase of infection. The lowest concentration of SARS-CoV-2 viral copies this assay can detect is 138 copies/mL. A negative result does not preclude SARS-Cov-2 infection and should not be used as the sole basis for treatment or other patient management decisions. A negative result may occur with  improper specimen collection/handling, submission of specimen other than nasopharyngeal swab, presence of viral mutation(s) within the areas targeted by this assay, and inadequate number of viral copies(<138 copies/mL). A negative result must be combined with clinical observations, patient history, and epidemiological information. The expected result is Negative.  Fact Sheet for Patients:  EntrepreneurPulse.com.au  Fact Sheet for Healthcare Providers:  IncredibleEmployment.be  This test is no t yet approved or cleared by the [place] FDA and  has been authorized for detection and/or diagnosis of SARS-CoV-2 by FDA under an Emergency Use Authorization (EUA). This EUA will remain  in effect (meaning this  test can be used) for the duration of the COVID-19 declaration under Section 564(b)(1) of the Act, 21 U.S.C.section 360bbb-3(b)(1), unless the authorization is terminated  or revoked sooner.       Influenza A by PCR NEGATIVE NEGATIVE Final   Influenza B by PCR NEGATIVE NEGATIVE Final    Comment: (NOTE) The Xpert Xpress SARS-CoV-2/FLU/RSV plus assay is intended as an aid in the diagnosis of influenza from Nasopharyngeal swab specimens and should not be used as a sole basis for treatment. Nasal washings and aspirates are unacceptable for Xpert Xpress SARS-CoV-2/FLU/RSV testing.  Fact Sheet for Patients: EntrepreneurPulse.com.au  Fact Sheet for Healthcare Providers: IncredibleEmployment.be  This test is not yet approved or cleared by the [place] FDA and has been authorized for detection and/or diagnosis of SARS-CoV-2 by FDA under an Emergency Use Authorization (EUA). This EUA will remain in effect (meaning this test can be used) for the duration of the COVID-19 declaration under Section 564(b)(1) of the Act, 21 U.S.C. section 360bbb-3(b)(1), unless the authorization is terminated or revoked.  Performed at [facility], [place] [address], [place], [place] [address]   Blood culture (routine x 2)     Status: None (Preliminary result)   Collection Time: 03/20/21  9:46 PM   Specimen: BLOOD  Result Value Ref Range Status   Specimen Description   Final    BLOOD BLOOD RIGHT HAND Performed at [facility] [address] [place], [place] [address]    Special Requests   Final    BOTTLES DRAWN AEROBIC ONLY Blood Culture results may not be optimal due  to an inadequate volume of blood received in culture bottles Performed at [facility] [address], [place], [place] [address]    Culture   Final    NO GROWTH 3 DAYS Performed at [facility], [place] [address] [place], [place]  [address]    Report Status PENDING  Incomplete  MRSA Next Gen by PCR, Nasal     Status: None   Collection Time: 03/20/21  9:58 PM   Specimen: Nasal Mucosa; Nasal Swab  Result Value Ref Range Status   MRSA by PCR Next Gen NOT DETECTED NOT DETECTED Final    Comment: (NOTE) The GeneXpert MRSA Assay (FDA approved for NASAL specimens only), is one component of a comprehensive MRSA colonization surveillance program. It is not intended to diagnose MRSA infection nor to guide or monitor treatment for MRSA infections. Test performance is not FDA approved in patients less than 62 years old. Performed at [facility], [place] [address], [place], [place] [address]   Urine Culture     Status: None   Collection Time: 03/21/21  9:58 AM   Specimen: Urine, Catheterized  Result Value Ref Range Status   Specimen Description   Final    URINE, CATHETERIZED Performed at [facility] [address] [place], [place] [address]    Special Requests   Final    NONE Performed at [facility], [place] [address] [place], [place] [address]    Culture   Final    NO GROWTH Performed at [facility], [place] [address], [place], [place] [address]    Report Status 03/22/2021 FINAL  Final          Radiology Studies: DG CHEST PORT 1 VIEW  Result Date: 03/23/2021 CLINICAL DATA:  70 year old male with history of shortness of breath. EXAM: PORTABLE CHEST 1 VIEW COMPARISON:  Chest x-ray 03/20/2021. FINDINGS: Ill-defined opacities are noted in the left lung base. Small left pleural effusion. Right lung is clear. No right pleural effusion. No pneumothorax. No evidence of pulmonary edema. Heart size is borderline enlarged. Upper mediastinal contours are within normal limits. Atherosclerotic calcifications in the thoracic aorta. IMPRESSION: 1. Ill-defined left lower lobe opacity concerning for probable bronchopneumonia. 2. Small left pleural effusion. 3. Aortic  atherosclerosis. Electronically Signed   By: Vinnie Langton M.D.   On: 03/23/2021 15:11        Scheduled Meds:  [START ON 03/25/2021] amLODipine  10 mg Oral Daily   aspirin EC  81 mg Oral Daily   atorvastatin  40 mg Oral QPM   bisacodyl  10 mg Rectal Daily   bisoprolol  10 mg Oral Daily   budesonide (PULMICORT) nebulizer solution  0.5 mg Nebulization BID   chlorhexidine  15 mL Mouth Rinse BID   Chlorhexidine Gluconate Cloth  6 each Topical Q0600   fluticasone  2 spray Each Nare Daily   haloperidol  5 mg Oral BID   heparin injection (subcutaneous)  5,000 Units Subcutaneous Q8H   ipratropium-albuterol  3 mL Nebulization Q6H   isosorbide-hydrALAZINE  1 tablet Oral TID   loratadine  10 mg Oral Daily   mouth rinse  15 mL Mouth Rinse q12n4p   methylPREDNISolone (SOLU-MEDROL) injection  60 mg Intravenous Daily   nicotine  21 mg Transdermal Daily   pantoprazole  40 mg Oral Q0600   polyethylene glycol  17 g Oral BID   senna-docusate  1 tablet Oral BID   sodium bicarbonate  650  mg Oral TID   Continuous Infusions:  sodium chloride 50 mL/hr at 03/24/21 1345   sodium chloride Stopped (03/22/21 1100)   ampicillin-sulbactam (UNASYN) IV 3 g (03/24/21 1342)   azithromycin 500 mg (03/24/21 1559)     LOS: 4 days    Time spent: 40 minutes    Irine Seal, MD [facility]   To contact the attending provider between 7A-7P or the covering provider during after hours 7P-7A, please log into the web site [email or web address] and access using universal [place] password for that web site. If you do not have the password, please call the hospital operator.  03/24/2021, 5:48 PM

## 2021-03-24 NOTE — NC FL2 (Signed)
[facility] LEVEL OF CARE SCREENING TOOL     IDENTIFICATION  Patient Name: Gerald Nelson Birthdate: 1950-12-03 Sex: male Admission Date (Current Location): 03/20/2021  [facility] and [place] Number:  Herbalist and Address:  [facility],  [address] [place], [place]      Provider Number: [id number]  Attending Physician Name and Address:  Eugenie Filler, MD  Relative Name and Phone Number:  [email or web address] (Sister)   [phone]    Current Level of Care: Hospital Recommended Level of Care: [place] Prior Approval Number:    Date Approved/Denied:   PASRR Number: Pending  Discharge Plan: SNF    Current Diagnoses: Patient Active Problem List   Diagnosis Date Noted   Constipation 03/23/2021   Aspiration pneumonia due to gastric secretions ([place])    E coli bacteremia 03/22/2021   Acute renal failure (ARF) ([place]) 03/20/2021   COPD exacerbation ([place]) 05/01/2020   Stage 3 severe COPD by GOLD classification ([place]) 11/14/2018   Noncompliance 04/23/2017   Mitral regurgitation 04/23/2017   Thrombus - possible apical thrombus 01/2017 04/23/2017   Hyperlipidemia 04/23/2017   Pulmonary hypertension (HCC)    SOB (shortness of breath)    Palliative care by specialist    Tobacco abuse 04/19/2017   Acute respiratory failure with hypoxia ([place]) 01/27/2017   Hypertensive urgency 01/27/2017   CHF (congestive heart failure) ([place]) 01/26/2017   CKD (chronic kidney disease), stage III ([place]) 01/04/2017   Essential hypertension 01/04/2017   Cerebral infarction ([place]) 12/12/2013   Hypotension 12/06/2012   Leukocytosis 12/06/2012   Hyponatremia 12/06/2012   Hiatal hernia 12/06/2012   Syncope 12/06/2012   Schizophrenia (HCC)     Orientation RESPIRATION BLADDER Height & Weight     Self, Situation, Place  O2 (2L/[place]) Indwelling catheter Weight: 69.1 kg Height:  '5\' 5"'$  (165.1 cm)  BEHAVIORAL SYMPTOMS/MOOD NEUROLOGICAL BOWEL NUTRITION  STATUS   (none)   Continent Diet (see d/c summary)  AMBULATORY STATUS COMMUNICATION OF NEEDS Skin   Extensive Assist Verbally Normal                       Personal Care Assistance Level of Assistance  Bathing, Feeding, Dressing Bathing Assistance: Maximum assistance Feeding assistance: Independent Dressing Assistance: Limited assistance     Functional Limitations Info  Sight, Hearing, Speech Sight Info: Adequate Hearing Info: Adequate Speech Info: Adequate    SPECIAL CARE FACTORS FREQUENCY  PT (By licensed PT), OT (By licensed OT)     PT Frequency: 5X/W OT Frequency: 5X/W            Contractures Contractures Info: Not present    Additional Factors Info  Code Status, Allergies Code Status Info: full Allergies Info: Sulfa Antibiotics           Current Medications (03/24/2021):  This is the current hospital active medication list Current Facility-Administered Medications  Medication Dose Route Frequency Provider Last Rate Last Admin   0.45 % sodium chloride infusion   Intravenous Continuous Eugenie Filler, MD 50 mL/hr at 03/24/21 1345 New Bag at 03/24/21 1345   0.9 %  sodium chloride infusion   Intravenous PRN Donnamae Jude, MD   Stopped at 03/22/21 1100   albuterol (PROVENTIL) (2.5 MG/3ML) 0.083% nebulizer solution 2.5 mg  2.5 mg Nebulization Q6H PRN Donnamae Jude, MD   2.5 mg at 03/24/21 1258   [START ON 03/25/2021] amLODipine (NORVASC) tablet 10 mg  10 mg Oral Daily [place] Silos,  Malachy Moan, MD       Ampicillin-Sulbactam (UNASYN) 3 g in sodium chloride 0.9 % 100 mL IVPB  3 g Intravenous Q6H Eugenie Filler, MD 200 mL/hr at 03/24/21 1342 3 g at 03/24/21 1342   aspirin EC tablet 81 mg  81 mg Oral Daily Donnamae Jude, MD   81 mg at 03/24/21 0844   atorvastatin (LIPITOR) tablet 40 mg  40 mg Oral QPM Donnamae Jude, MD   40 mg at 03/23/21 1818   azithromycin (ZITHROMAX) 500 mg in sodium chloride 0.9 % 250 mL IVPB  500 mg Intravenous Q24H Eugenie Filler, MD 250  mL/hr at 03/24/21 1559 500 mg at 03/24/21 1559   bisacodyl (DULCOLAX) suppository 10 mg  10 mg Rectal Daily Eugenie Filler, MD   10 mg at 03/24/21 0845   bisoprolol (ZEBETA) tablet 10 mg  10 mg Oral Daily Donnamae Jude, MD   10 mg at 03/24/21 0844   budesonide (PULMICORT) nebulizer solution 0.5 mg  0.5 mg Nebulization BID Eugenie Filler, MD   0.5 mg at 03/24/21 0900   chlorhexidine (PERIDEX) 0.12 % solution 15 mL  15 mL Mouth Rinse BID Donnamae Jude, MD   15 mL at 03/24/21 0844   Chlorhexidine Gluconate Cloth 2 % PADS 6 each  6 each Topical Q0600 Donnamae Jude, MD   6 each at 03/22/21 0342   fluticasone (FLONASE) 50 MCG/ACT nasal spray 2 spray  2 spray Each Nare Daily Eugenie Filler, MD   2 spray at 03/24/21 0844   haloperidol (HALDOL) tablet 5 mg  5 mg Oral BID Eugenie Filler, MD   5 mg at 03/24/21 0844   heparin injection 5,000 Units  5,000 Units Subcutaneous Q8H Eugenie Filler, MD   5,000 Units at 03/24/21 1342   ipratropium-albuterol (DUONEB) 0.5-2.5 (3) MG/3ML nebulizer solution 3 mL  3 mL Nebulization Q6H Donnamae Jude, MD   3 mL at 03/24/21 1513   isosorbide-hydrALAZINE (BIDIL) 20-37.5 MG per tablet 1 tablet  1 tablet Oral TID Eugenie Filler, MD   1 tablet at 03/24/21 1600   loratadine (CLARITIN) tablet 10 mg  10 mg Oral Daily Eugenie Filler, MD   10 mg at 03/24/21 0844   MEDLINE mouth rinse  15 mL Mouth Rinse q12n4p Donnamae Jude, MD   15 mL at 03/24/21 1600   methylPREDNISolone sodium succinate (SOLU-MEDROL) 125 mg/2 mL injection 60 mg  60 mg Intravenous Daily Eugenie Filler, MD   60 mg at 03/24/21 0845   nicotine (NICODERM CQ - dosed in mg/24 hours) patch 21 mg  21 mg Transdermal Daily Eugenie Filler, MD   21 mg at 03/24/21 0845   ondansetron (ZOFRAN) tablet 4 mg  4 mg Oral Q6H PRN Donnamae Jude, MD       Or   ondansetron [facility]) injection 4 mg  4 mg Intravenous Q6H PRN Donnamae Jude, MD   4 mg at 03/24/21 0145   pantoprazole (PROTONIX) EC  tablet 40 mg  40 mg Oral Q0600 Eugenie Filler, MD   40 mg at 03/23/21 0721   polyethylene glycol (MIRALAX / GLYCOLAX) packet 17 g  17 g Oral BID Eugenie Filler, MD   17 g at 03/24/21 0844   senna-docusate (Senokot-S) tablet 1 tablet  1 tablet Oral BID Eugenie Filler, MD   1 tablet at 03/24/21 0844   sodium bicarbonate tablet 650 mg  650  mg Oral TID Eugenie Filler, MD   650 mg at 03/24/21 1600   traZODone (DESYREL) tablet 25 mg  25 mg Oral QHS PRN Donnamae Jude, MD   25 mg at 03/24/21 0120     Discharge Medications: Please see discharge summary for a list of discharge medications.  Relevant Imaging Results:  Relevant Lab Results:   Additional Information [place], [place]

## 2021-03-24 NOTE — Progress Notes (Signed)
Pt has had a 2nd episode of getting OOB very anxious RR = 40. Slowly got him to refocus on his breathing and realize he has a foley catheter . RT was called after the 2nd episode of anxiety and resp distress to give a neb tx.  Notified [facility] on call provider who stopped by and observed pt in his calmer state.

## 2021-03-24 NOTE — Progress Notes (Signed)
Physical Therapy Treatment Patient Details Name: Gerald Nelson MRN: [id number] DOB: 1950/12/22 Today's Date: 03/24/2021   History of Present Illness Patient is a 70 year old male who presentedto the hosptial with shortness of breath. patietn was found to have COPD exacerbation, acute renal failure and hypotension. PMH: CVA,HTN, CHF, COPD    PT Comments    Mod encouragement to get pt to participate-he was soundly sleeping upon my arrival. He declined OOB/ambulation. Per chart review, he walked earlier with mobility tech. Will continue to follow and progress activity as tolerated.     Recommendations for follow up therapy are one component of a multi-disciplinary discharge planning process, led by the attending physician.  Recommendations may be updated based on patient status, additional functional criteria and insurance authorization.  Follow Up Recommendations  SNF     Equipment Recommendations  Rolling walker with 5" wheels (possibly-continuing to assess-he may refuse to use one)    Recommendations for Other Services       Precautions / Restrictions Precautions Precautions: Fall Precaution Comments: monitor O2 Restrictions Weight Bearing Restrictions: No     Mobility  Bed Mobility Overal bed mobility: Needs Assistance Bed Mobility: Supine to Sit;Sit to Supine     Supine to sit: Supervision Sit to supine: Supervision   General bed mobility comments: After much encouragement, pt sat EOB to allow therapist to reposition lines. He then scooted up the bed then returned to sidelying.    Transfers                 General transfer comment: NT-pt declined OOB with PT at this time.  Ambulation/Gait                 Stairs             Wheelchair Mobility    Modified Rankin (Stroke Patients Only)       Balance   Sitting-balance support: Feet unsupported;No upper extremity supported Sitting balance-Leahy Scale: Good                                       Cognition Arousal/Alertness:  (drowsy) Behavior During Therapy: Flat affect Overall Cognitive Status: No family/caregiver present to determine baseline cognitive functioning                                        Exercises      General Comments        Pertinent Vitals/Pain Pain Assessment: Faces Faces Pain Scale: No hurt    Home Living                      Prior Function            PT Goals (current goals can now be found in the care plan section) Progress towards PT goals: Progressing toward goals    Frequency    Min 3X/week      PT Plan Current plan remains appropriate    Co-evaluation              AM-PAC PT "6 Clicks" Mobility   Outcome Measure  Help needed turning from your back to your side while in a flat bed without using bedrails?: None Help needed moving from lying on your back to sitting on the side of a  flat bed without using bedrails?: None Help needed moving to and from a bed to a chair (including a wheelchair)?: A Little Help needed standing up from a chair using your arms (e.g., wheelchair or bedside chair)?: A Little Help needed to walk in hospital room?: A Little Help needed climbing 3-5 steps with a railing? : A Little 6 Click Score: 20    End of Session   Activity Tolerance: Patient tolerated treatment well Patient left: in bed;with call bell/phone within reach;with bed alarm set   PT Visit Diagnosis: Unsteadiness on feet (R26.81)     Time: [id number] PT Time Calculation (min) (ACUTE ONLY): 10 min  Charges:  $Therapeutic Activity: 8-22 mins                         Doreatha Massed, PT Acute Rehabilitation  Office: [phone] Pager: [phone]

## 2021-03-25 DIAGNOSIS — N1832 Chronic kidney disease, stage 3b: Secondary | ICD-10-CM | POA: Diagnosis not present

## 2021-03-25 DIAGNOSIS — J690 Pneumonitis due to inhalation of food and vomit: Secondary | ICD-10-CM | POA: Diagnosis not present

## 2021-03-25 DIAGNOSIS — J9601 Acute respiratory failure with hypoxia: Secondary | ICD-10-CM | POA: Diagnosis not present

## 2021-03-25 DIAGNOSIS — N179 Acute kidney failure, unspecified: Secondary | ICD-10-CM | POA: Diagnosis not present

## 2021-03-25 LAB — CBC WITH DIFFERENTIAL/PLATELET
Abs Immature Granulocytes: 1.14 10*3/uL — ABNORMAL HIGH (ref 0.00–0.07)
Basophils Absolute: 0.1 10*3/uL (ref 0.0–0.1)
Basophils Relative: 1 %
Eosinophils Absolute: 0 10*3/uL (ref 0.0–0.5)
Eosinophils Relative: 0 %
HCT: 26.7 % — ABNORMAL LOW (ref 39.0–52.0)
Hemoglobin: 9.2 g/dL — ABNORMAL LOW (ref 13.0–17.0)
Immature Granulocytes: 9 %
Lymphocytes Relative: 6 %
Lymphs Abs: 0.8 10*3/uL (ref 0.7–4.0)
MCH: 29.4 pg (ref 26.0–34.0)
MCHC: 34.5 g/dL (ref 30.0–36.0)
MCV: 85.3 fL (ref 80.0–100.0)
Monocytes Absolute: 0.9 10*3/uL (ref 0.1–1.0)
Monocytes Relative: 7 %
Neutro Abs: 9.6 10*3/uL — ABNORMAL HIGH (ref 1.7–7.7)
Neutrophils Relative %: 77 %
Platelets: 125 10*3/uL — ABNORMAL LOW (ref 150–400)
RBC: 3.13 MIL/uL — ABNORMAL LOW (ref 4.22–5.81)
RDW: 13.7 % (ref 11.5–15.5)
WBC: 12.4 10*3/uL — ABNORMAL HIGH (ref 4.0–10.5)
nRBC: 0 % (ref 0.0–0.2)

## 2021-03-25 LAB — RENAL FUNCTION PANEL
Albumin: 2.5 g/dL — ABNORMAL LOW (ref 3.5–5.0)
Anion gap: 6 (ref 5–15)
BUN: 45 mg/dL — ABNORMAL HIGH (ref 8–23)
CO2: 22 mmol/L (ref 22–32)
Calcium: 8.3 mg/dL — ABNORMAL LOW (ref 8.9–10.3)
Chloride: 111 mmol/L (ref 98–111)
Creatinine, Ser: 1.79 mg/dL — ABNORMAL HIGH (ref 0.61–1.24)
GFR, Estimated: 41 mL/min — ABNORMAL LOW (ref >60)
Glucose, Bld: 126 mg/dL — ABNORMAL HIGH (ref 70–99)
Phosphorus: 3.7 mg/dL (ref 2.5–4.6)
Potassium: 4.9 mmol/L (ref 3.5–5.1)
Sodium: 139 mmol/L (ref 135–145)

## 2021-03-25 LAB — SARS CORONAVIRUS 2 (TAT 6-24 HRS): SARS Coronavirus 2: NEGATIVE

## 2021-03-25 LAB — MAGNESIUM: Magnesium: 1.7 mg/dL (ref 1.7–2.4)

## 2021-03-25 MED ORDER — ALBUTEROL SULFATE (2.5 MG/3ML) 0.083% IN NEBU
2.5000 mg | INHALATION_SOLUTION | RESPIRATORY_TRACT | Status: DC | PRN
  Filled 2021-03-25: qty 3

## 2021-03-25 MED ORDER — IPRATROPIUM-ALBUTEROL 0.5-2.5 (3) MG/3ML IN SOLN
3.0000 mL | Freq: Four times a day (QID) | RESPIRATORY_TRACT | Status: DC
  Administered 2021-03-25 – 2021-03-26 (×4): 3 mL via RESPIRATORY_TRACT
  Filled 2021-03-25 (×4): qty 3

## 2021-03-25 NOTE — Progress Notes (Signed)
    OVERNIGHT PROGRESS REPORT  Notified by RN for confusion an combativeness.  Patient was escorted back to bed and O2 and respiratory treatments were initiated. Respiratory is decreased back to [place] at this time and restraints will be removed as mentation resolves. SPO2 increasing back to baseline    Gershon Cull MSNA MSN ACNPC-AG Acute Care Nurse Practitioner [place]

## 2021-03-25 NOTE — Progress Notes (Signed)
Patient ID: Gerald Nelson, male   DOB: July 22, 1950, 70 y.o.   MRN: [id number]  PROGRESS NOTE    Gerald Nelson  [id number] DOB: 21-Jun-1951 DOA: 03/20/2021 PCP: [name], MD   Brief Narrative:  70 year old male with  history of COPD with ongoing tobacco use, prior CVA, hypertension, CHF, CKD 3, history of mitral regurg presented to the ED with hypoxia.  He was found to be hypotensive with leukocytosis along with elevated D-dimer.  He was treated for COPD exacerbation and was also started on antibiotics.  VQ scan was negative for DVT.  He was also started on IV fluids for AKI.  He was found to have E. coli bacteremia for which antibiotics were narrowed to IV Ancef.  He subsequently had possible aspiration event and antibiotics then changed to Unasyn and Zithromax.  Assessment & Plan:   Acute hypoxic respiratory failure Possible COPD exacerbation Ongoing tobacco use  -Needed BiPAP on presentation. -Currently on 4 L oxygen via nasal cannula.  Wean off as able -Continue nebs and current dose of Solu-Medrol. -Patient was counseled regarding tobacco cessation by prior hospitalist -VQ scan showed very low probability of PE  Acute kidney injury on chronic kidney disease stage IIIb Acute metabolic acidosis -Possibly prerenal.  Baseline creatinine 1.6-1.9.  Creatinine peaked to 4.5 -Much improved creatinine: 1.79 today.  Renal ultrasound negative for hydronephrosis -Urine cultures no growth so far. -ARB/diuretics on hold. -DC IV fluids.  Monitor creatinine.  Strict input and output/daily weights -Acidosis is improving.  DC oral sodium bicarbonate  E. coli bacteremia Leukocytosis Sepsis ruled out -Leukocytosis improving.  Currently hemodynamically stable.  Patient afebrile.  Antibiotics were narrowed to IV Ancef on 9 9022 but subsequently switched to IV Unasyn and Zithromax for possible aspiration pneumonia  Possible aspiration pneumonia -DC Zithromax.  Continue  Unasyn.  Hypertension Hypotension -Blood pressure has improved with IV fluids.  Continue amlodipine, bisoprolol and isosorbide hydralazine.  ARB and diuretics on hold.  Abnormal TSH -Free T4 within normal limits.  Outpatient follow-up of TSH within 4 to 6 weeks  Hyperlipidemia Continue statin  History of mitral regurgitation -Monitor.  Outpatient follow-up with cardiology  History of unspecified CVA -Continue aspirin for stroke prophylaxis.  Continue statin  History of schizophrenia -Continue Haldol  Chronic diastolic CHF -Most recent echo from 03/24/2020 showed EF of 55%.  Strict input and output.  Daily weights.   -Spironolactone/Lasix/ARB on hold for now -Continue bisoprolol, isosorbide and hydralazine.  Outpatient follow-up with cardiology  Generalized deconditioning -PT is recommending SNF placement.  Social worker following  DVT prophylaxis: Heparin Code Status: Full Family Communication: None at bedside Disposition Plan: Status is: Inpatient  Remains inpatient appropriate because:Inpatient level of care appropriate due to severity of illness  Dispo: The patient is from: Home              Anticipated d/c is to: SNF in 1 to 2 days if improved clinically              Patient currently is not medically stable to d/c.   Difficult to place patient No  Consultants: None  Procedures: None  Antimicrobials:  Anti-infectives (From admission, onward)    Start     Dose/Rate Route Frequency Ordered Stop   03/24/21 0800  Ampicillin-Sulbactam (UNASYN) 3 g in sodium chloride 0.9 % 100 mL IVPB        3 g 200 mL/hr over 30 Minutes Intravenous Every 6 hours 03/24/21 0731     03/24/21 0200  Ampicillin-Sulbactam (UNASYN) 3 g in sodium chloride 0.9 % 100 mL IVPB  Status:  Discontinued        3 g 200 mL/hr over 30 Minutes Intravenous Every 12 hours 03/23/21 1538 03/24/21 0731   03/23/21 2000  Ampicillin-Sulbactam (UNASYN) 3 g in sodium chloride 0.9 % 100 mL IVPB  Status:   Discontinued        3 g 200 mL/hr over 30 Minutes Intravenous Every 12 hours 03/23/21 1532 03/23/21 1538   03/23/21 1630  azithromycin (ZITHROMAX) 500 mg in sodium chloride 0.9 % 250 mL IVPB        500 mg 250 mL/hr over 60 Minutes Intravenous Every 24 hours 03/23/21 1530     03/23/21 1400  ceFAZolin (ANCEF) IVPB 2g/100 mL premix  Status:  Discontinued        2 g 200 mL/hr over 30 Minutes Intravenous Every 12 hours 03/23/21 0903 03/23/21 1532   03/22/21 1800  cefTRIAXone (ROCEPHIN) 2 g in sodium chloride 0.9 % 100 mL IVPB  Status:  Discontinued        2 g 200 mL/hr over 30 Minutes Intravenous Every 24 hours 03/22/21 1038 03/23/21 0903   03/21/21 1600  cefTRIAXone (ROCEPHIN) 1 g in sodium chloride 0.9 % 100 mL IVPB  Status:  Discontinued        1 g 200 mL/hr over 30 Minutes Intravenous Every 24 hours 03/20/21 1952 03/21/21 0802   03/21/21 1100  ceFEPIme (MAXIPIME) 2 g in sodium chloride 0.9 % 100 mL IVPB  Status:  Discontinued        2 g 200 mL/hr over 30 Minutes Intravenous Every 24 hours 03/21/21 1037 03/22/21 1038   03/21/21 1000  cefTRIAXone (ROCEPHIN) 2 g in sodium chloride 0.9 % 100 mL IVPB  Status:  Discontinued        2 g 200 mL/hr over 30 Minutes Intravenous Every 24 hours 03/21/21 0802 03/21/21 1014   03/20/21 1815  vancomycin (VANCOREADY) IVPB 1250 mg/250 mL        1,250 mg 166.7 mL/hr over 90 Minutes Intravenous STAT 03/20/21 1814 03/20/21 2101   03/20/21 1815  ceFEPIme (MAXIPIME) 2 g in sodium chloride 0.9 % 100 mL IVPB        2 g 200 mL/hr over 30 Minutes Intravenous STAT 03/20/21 1814 03/20/21 1948        Subjective: Patient seen and examined at bedside.  Denies worsening shortness of breath, fever or vomiting.  Poor historian.  Objective: Vitals:   03/25/21 0319 03/25/21 0343 03/25/21 0758 03/25/21 0851  BP:  (!) 139/93  135/86  Pulse:  86  87  Resp:  20    Temp:  97.9 F (36.6 C)  98.7 F (37.1 C)  TempSrc:  Oral  Oral  SpO2: 93% 100% 97% 100%  Weight:   65.2 kg    Height:        Intake/Output Summary (Last 24 hours) at 03/25/2021 1108 Last data filed at 03/25/2021 0900 Gross per 24 hour  Intake 2029.68 ml  Output 2650 ml  Net -620.32 ml   Filed Weights   03/23/21 1800 03/24/21 0419 03/25/21 0343  Weight: 62.6 kg 69.1 kg 65.2 kg    Examination:  General exam: Appears calm and comfortable.  Looks chronically ill.  Currently on 4 L oxygen by nasal cannula. Respiratory system: Bilateral decreased breath sounds at bases with scattered crackles Cardiovascular system: S1 & S2 heard, Rate controlled Gastrointestinal system: Abdomen is nondistended, soft and nontender. Normal bowel  sounds heard. Extremities: No cyanosis, clubbing; trace lower extremity edema Central nervous system: Awake, very slow to respond, poor historian. No focal neurological deficits. Moving extremities Skin: No rashes, lesions or ulcers Psychiatry: Affect is flat.  Does not participate in conversation much.   Data Reviewed: I have personally reviewed following labs and imaging studies  CBC: Recent Labs  Lab 03/20/21 1628 03/20/21 1717 03/21/21 0252 03/22/21 0303 03/23/21 0424 03/24/21 0521 03/25/21 0455  WBC 23.4*  --  19.9* 20.2* 15.1* 13.1* 12.4*  NEUTROABS 19.8*  --   --  16.5* 11.3* 9.8* 9.6*  HGB 11.2*   < > 10.7* 10.7* 9.8* 8.8* 9.2*  HCT 32.1*   < > 30.5* 30.6* 27.8* 24.9* 26.7*  MCV 85.6  --  84.5 84.3 84.0 84.4 85.3  PLT 111*  --  99* 96* 79* 82* 125*   < > = values in this interval not displayed.   Basic Metabolic Panel: Recent Labs  Lab 03/21/21 0252 03/22/21 0303 03/23/21 0424 03/24/21 0521 03/25/21 0455  NA 139 145 141 139 139  K 4.7 4.2 4.0 4.0 4.9  CL 107 115* 113* 110 111  CO2 16* 20* 19* 21* 22  GLUCOSE 120* 159* 117* 127* 126*  BUN 84* 82* 66* 54* 45*  CREATININE 3.64* 2.97* 2.58* 1.95* 1.79*  CALCIUM 8.7* 8.6* 8.1* 8.0* 8.3*  MG  --  2.6* 2.1  --  1.7  PHOS  --   --  2.5  --  3.7   GFR: Estimated Creatinine  Clearance: 34.4 mL/min (A) (by C-G formula based on SCr of 1.79 mg/dL (H)). Liver Function Tests: Recent Labs  Lab 03/20/21 1628 03/21/21 0252 03/22/21 0303 03/23/21 0424 03/25/21 0455  AST '29 27 18  '$ --   --   ALT '23 22 19  '$ --   --   ALKPHOS 76 59 62  --   --   BILITOT 1.7* 1.2 0.7  --   --   PROT 6.8 6.4* 6.1*  --   --   ALBUMIN 2.9* 2.8* 2.5* 2.4* 2.5*   No results for input(s): LIPASE, AMYLASE in the last 168 hours. No results for input(s): AMMONIA in the last 168 hours. Coagulation Profile: No results for input(s): INR, PROTIME in the last 168 hours. Cardiac Enzymes: No results for input(s): CKTOTAL, CKMB, CKMBINDEX, TROPONINI in the last 168 hours. BNP (last 3 results) No results for input(s): PROBNP in the last 8760 hours. HbA1C: No results for input(s): HGBA1C in the last 72 hours. CBG: No results for input(s): GLUCAP in the last 168 hours. Lipid Profile: No results for input(s): CHOL, HDL, LDLCALC, TRIG, CHOLHDL, LDLDIRECT in the last 72 hours. Thyroid Function Tests: No results for input(s): TSH, T4TOTAL, FREET4, T3FREE, THYROIDAB in the last 72 hours. Anemia Panel: No results for input(s): VITAMINB12, FOLATE, FERRITIN, TIBC, IRON, RETICCTPCT in the last 72 hours. Sepsis Labs: Recent Labs  Lab 03/20/21 1628 03/20/21 1928  LATICACIDVEN 1.7 1.7    Recent Results (from the past 240 hour(s))  Blood culture (routine x 2)     Status: Abnormal   Collection Time: 03/20/21  4:29 PM   Specimen: BLOOD RIGHT ARM  Result Value Ref Range Status   Specimen Description   Final    BLOOD RIGHT ARM Performed at [facility], [place] [address] [place], [place] [address]    Special Requests   Final    BOTTLES DRAWN AEROBIC AND ANAEROBIC Blood Culture adequate volume Performed at [facility], [place] [address], [place], [place] [address]    Culture  Setup Time   Final    GRAM NEGATIVE RODS IN BOTH AEROBIC AND ANAEROBIC BOTTLES CRITICAL  RESULT CALLED TO, READ BACK BY AND VERIFIED WITH: PHARM D C.SHADE ON [id number] AT 1135 BY E.PARRISH Performed at [facility], [place] [address], [place], [place] [address]    Culture ESCHERICHIA COLI (A)  Final   Report Status 03/23/2021 FINAL  Final   Organism ID, Bacteria ESCHERICHIA COLI  Final      Susceptibility   Escherichia coli - MIC*    AMPICILLIN <=2 SENSITIVE Sensitive     CEFAZOLIN <=4 SENSITIVE Sensitive     CEFEPIME <=0.12 SENSITIVE Sensitive     CEFTAZIDIME <=1 SENSITIVE Sensitive     CEFTRIAXONE <=0.25 SENSITIVE Sensitive     CIPROFLOXACIN <=0.25 SENSITIVE Sensitive     GENTAMICIN <=1 SENSITIVE Sensitive     IMIPENEM <=0.25 SENSITIVE Sensitive     TRIMETH/SULFA <=20 SENSITIVE Sensitive     AMPICILLIN/SULBACTAM <=2 SENSITIVE Sensitive     PIP/TAZO <=4 SENSITIVE Sensitive     * ESCHERICHIA COLI  Blood Culture ID Panel (Reflexed)     Status: Abnormal   Collection Time: 03/20/21  4:29 PM  Result Value Ref Range Status   Enterococcus faecalis NOT DETECTED NOT DETECTED Final   Enterococcus Faecium NOT DETECTED NOT DETECTED Final   Listeria monocytogenes NOT DETECTED NOT DETECTED Final   Staphylococcus species NOT DETECTED NOT DETECTED Final   Staphylococcus aureus (BCID) NOT DETECTED NOT DETECTED Final   Staphylococcus epidermidis NOT DETECTED NOT DETECTED Final   Staphylococcus lugdunensis NOT DETECTED NOT DETECTED Final   Streptococcus species NOT DETECTED NOT DETECTED Final   Streptococcus agalactiae NOT DETECTED NOT DETECTED Final   Streptococcus pneumoniae NOT DETECTED NOT DETECTED Final   Streptococcus pyogenes NOT DETECTED NOT DETECTED Final   A.calcoaceticus-baumannii NOT DETECTED NOT DETECTED Final   Bacteroides fragilis NOT DETECTED NOT DETECTED Final   Enterobacterales DETECTED (A) NOT DETECTED Final    Comment: Enterobacterales represent a large order of gram negative bacteria, not a single organism. CRITICAL RESULT CALLED TO, READ BACK BY AND VERIFIED  WITH: PHARM D C.SHADE ON [id number] AT 1135 BY E.PARRISH    Enterobacter cloacae complex NOT DETECTED NOT DETECTED Final   Escherichia coli DETECTED (A) NOT DETECTED Final    Comment: CRITICAL RESULT CALLED TO, READ BACK BY AND VERIFIED WITH: PHARM D C.SHADE ON [id number] AT [id number] BY E.PARRISH    Klebsiella aerogenes NOT DETECTED NOT DETECTED Final   Klebsiella oxytoca NOT DETECTED NOT DETECTED Final   Klebsiella pneumoniae NOT DETECTED NOT DETECTED Final   Proteus species NOT DETECTED NOT DETECTED Final   Salmonella species NOT DETECTED NOT DETECTED Final   Serratia marcescens NOT DETECTED NOT DETECTED Final   Haemophilus influenzae NOT DETECTED NOT DETECTED Final   Neisseria meningitidis NOT DETECTED NOT DETECTED Final   Pseudomonas aeruginosa NOT DETECTED NOT DETECTED Final   Stenotrophomonas maltophilia NOT DETECTED NOT DETECTED Final   Candida albicans NOT DETECTED NOT DETECTED Final   Candida auris NOT DETECTED NOT DETECTED Final   Candida glabrata NOT DETECTED NOT DETECTED Final   Candida krusei NOT DETECTED NOT DETECTED Final   Candida parapsilosis NOT DETECTED NOT DETECTED Final   Candida tropicalis NOT DETECTED NOT DETECTED Final   Cryptococcus neoformans/gattii NOT DETECTED NOT DETECTED Final   CTX-M ESBL NOT DETECTED NOT DETECTED Final   Carbapenem resistance IMP NOT DETECTED NOT DETECTED Final  Carbapenem resistance KPC NOT DETECTED NOT DETECTED Final   Carbapenem resistance NDM NOT DETECTED NOT DETECTED Final   Carbapenem resist OXA 48 LIKE NOT DETECTED NOT DETECTED Final   Carbapenem resistance VIM NOT DETECTED NOT DETECTED Final    Comment: Performed at [facility], [place] [address] [place], [place] [address]  Resp Panel by RT-PCR (Flu A&B, Covid) Nasopharyngeal Swab     Status: None   Collection Time: 03/20/21  7:28 PM   Specimen: Nasopharyngeal Swab; Nasopharyngeal(NP) swabs in vial transport medium  Result Value Ref Range Status   SARS Coronavirus 2 by RT PCR  NEGATIVE NEGATIVE Final    Comment: (NOTE) SARS-CoV-2 target nucleic acids are NOT DETECTED.  The SARS-CoV-2 RNA is generally detectable in upper respiratory specimens during the acute phase of infection. The lowest concentration of SARS-CoV-2 viral copies this assay can detect is 138 copies/mL. A negative result does not preclude SARS-Cov-2 infection and should not be used as the sole basis for treatment or other patient management decisions. A negative result may occur with  improper specimen collection/handling, submission of specimen other than nasopharyngeal swab, presence of viral mutation(s) within the areas targeted by this assay, and inadequate number of viral copies(<138 copies/mL). A negative result must be combined with clinical observations, patient history, and epidemiological information. The expected result is Negative.  Fact Sheet for Patients:  EntrepreneurPulse.com.au  Fact Sheet for Healthcare Providers:  IncredibleEmployment.be  This test is no t yet approved or cleared by the [place] FDA and  has been authorized for detection and/or diagnosis of SARS-CoV-2 by FDA under an Emergency Use Authorization (EUA). This EUA will remain  in effect (meaning this test can be used) for the duration of the COVID-19 declaration under Section 564(b)(1) of the Act, 21 U.S.C.section 360bbb-3(b)(1), unless the authorization is terminated  or revoked sooner.       Influenza A by PCR NEGATIVE NEGATIVE Final   Influenza B by PCR NEGATIVE NEGATIVE Final    Comment: (NOTE) The Xpert Xpress SARS-CoV-2/FLU/RSV plus assay is intended as an aid in the diagnosis of influenza from Nasopharyngeal swab specimens and should not be used as a sole basis for treatment. Nasal washings and aspirates are unacceptable for Xpert Xpress SARS-CoV-2/FLU/RSV testing.  Fact Sheet for Patients: EntrepreneurPulse.com.au  Fact Sheet for  Healthcare Providers: IncredibleEmployment.be  This test is not yet approved or cleared by the [place] FDA and has been authorized for detection and/or diagnosis of SARS-CoV-2 by FDA under an Emergency Use Authorization (EUA). This EUA will remain in effect (meaning this test can be used) for the duration of the COVID-19 declaration under Section 564(b)(1) of the Act, 21 U.S.C. section 360bbb-3(b)(1), unless the authorization is terminated or revoked.  Performed at [facility], [place] [address] [place], [place] [address]   Blood culture (routine x 2)     Status: None (Preliminary result)   Collection Time: 03/20/21  9:46 PM   Specimen: BLOOD  Result Value Ref Range Status   Specimen Description   Final    BLOOD BLOOD RIGHT HAND Performed at [place] [address], [place], [place] [address]    Special Requests   Final    BOTTLES DRAWN AEROBIC ONLY Blood Culture results may not be optimal due to an inadequate volume of blood received in culture bottles Performed at [place] [address] [place], [place] [address]    Culture   Final    NO  GROWTH 4 DAYS Performed at [facility], [place] [address] [place], [place] [address]    Report Status PENDING  Incomplete  MRSA Next Gen by PCR, Nasal     Status: None   Collection Time: 03/20/21  9:58 PM   Specimen: Nasal Mucosa; Nasal Swab  Result Value Ref Range Status   MRSA by PCR Next Gen NOT DETECTED NOT DETECTED Final    Comment: (NOTE) The GeneXpert MRSA Assay (FDA approved for NASAL specimens only), is one component of a comprehensive MRSA colonization surveillance program. It is not intended to diagnose MRSA infection nor to guide or monitor treatment for MRSA infections. Test performance is not FDA approved in patients less than 7 years old. Performed at [facility], [place] [address] [place], [place] [address]    Urine Culture     Status: None   Collection Time: 03/21/21  9:58 AM   Specimen: Urine, Catheterized  Result Value Ref Range Status   Specimen Description   Final    URINE, CATHETERIZED Performed at [facility] [address], [place], [place] [address]    Special Requests   Final    NONE Performed at [facility], [place] [address], [place], [place] [address]    Culture   Final    NO GROWTH Performed at [facility], [place] [address] [place],  [address]    Report Status 03/22/2021 FINAL  Final         Radiology Studies: DG CHEST PORT 1 VIEW  Result Date: 03/23/2021 CLINICAL DATA:  70 year old male with history of shortness of breath. EXAM: PORTABLE CHEST 1 VIEW COMPARISON:  Chest x-ray 03/20/2021. FINDINGS: Ill-defined opacities are noted in the left lung base. Small left pleural effusion. Right lung is clear. No right pleural effusion. No pneumothorax. No evidence of pulmonary edema. Heart size is borderline enlarged. Upper mediastinal contours are within normal limits. Atherosclerotic calcifications in the thoracic aorta. IMPRESSION: 1. Ill-defined left lower lobe opacity concerning for probable bronchopneumonia. 2. Small left pleural effusion. 3. Aortic atherosclerosis. Electronically Signed   By: Vinnie Langton M.D.   On: 03/23/2021 15:11        Scheduled Meds:  amLODipine  10 mg Oral Daily   aspirin EC  81 mg Oral Daily   atorvastatin  40 mg Oral QPM   bisacodyl  10 mg Rectal Daily   bisoprolol  10 mg Oral Daily   budesonide (PULMICORT) nebulizer solution  0.5 mg Nebulization BID   chlorhexidine  15 mL Mouth Rinse BID   Chlorhexidine Gluconate Cloth  6 each Topical Q0600   fluticasone  2 spray Each Nare Daily   haloperidol  5 mg Oral BID   heparin injection (subcutaneous)  5,000 Units Subcutaneous Q8H   ipratropium-albuterol  3 mL Nebulization TID   isosorbide-hydrALAZINE  1 tablet Oral TID   loratadine  10 mg  Oral Daily   mouth rinse  15 mL Mouth Rinse q12n4p   methylPREDNISolone (SOLU-MEDROL) injection  60 mg Intravenous Daily   nicotine  21 mg Transdermal Daily   pantoprazole  40 mg Oral Q0600   polyethylene glycol  17 g Oral BID   senna-docusate  1 tablet Oral BID   sodium bicarbonate  650 mg Oral TID   Continuous Infusions:  sodium chloride Stopped (03/22/21 1100)   ampicillin-sulbactam (UNASYN) IV 3 g (03/25/21 0858)   azithromycin 500 mg (03/24/21 1559)  Aline August, MD [facility] 03/25/2021, 11:08 AM

## 2021-03-25 NOTE — TOC Progression Note (Signed)
Transition of Care [facility]) - Progression Note    Patient Details  Name: Gerald Nelson MRN: [id number] Date of Birth: Feb 08, 1951  Transition of Care [facility]) CM/SW [facility], [place] Phone Number: 03/25/2021, 3:12 PM  Clinical Narrative:   Went to speak with patient re: bed offers.  Mr Bockus made it clear in no uncertain terms that he plans on returning home.  He did agree to work with [facility] PT.  Cindie with [facility] agreed to provide [facility] services. TOC will continue to follow during the course of hospitalization.     Expected Discharge Plan: [place] Barriers to Discharge: Barriers Resolved  Expected Discharge Plan and Services Expected Discharge Plan: [place]   Discharge Planning Services: CM Consult Post Acute Care Choice: [place] Living arrangements for the past 2 months: Apartment                                       Social Determinants of Health (SDOH) Interventions    Readmission Risk Interventions No flowsheet data found.

## 2021-03-25 NOTE — Progress Notes (Signed)
   03/25/21 1100  Oxygen Therapy  SpO2 95 %  O2 Device Nasal Cannula  O2 Flow Rate (L/min) 4 L/min  Patient Activity (if Appropriate) Ambulating  Mobility  Activity Ambulated in hall  Level of Assistance Contact guard assist, steadying assist  Assistive Device Other (Comment) (IV pole)  Distance Ambulated (ft) 160 ft  Mobility Ambulated with assistance in hallway  Mobility Response Tolerated well  Mobility performed by Mobility specialist  $Mobility charge 1 Mobility   Pt was sleeping upon entering the room. He was agreeable to mobilizing today. Asked the pt multiple times about utilizing the RW, but pt refused. However, he was agreeable about using the IV pole. Ambulated in hallway about 166f while pushing IV pole, on 4L [place]. Left pt in bed with call bell at side. RN notified of session.  Mobility Specialist - Progress Note Pre-mobility: SpO2 96% During mobility: SpO2 94% Post-mobility: SPO2 95%   KPinelandOffice: [phone]

## 2021-03-25 NOTE — Progress Notes (Addendum)
Pt does not need restraints, he has been cooperative and calm today, restraints was DC. Foley was removed per order. Pt has voided 2x. Pt walked at the hallway with minimal assistance with walker, PRN neb was given for wheezing, kept on RA, sat between 95-97%.

## 2021-03-25 NOTE — Progress Notes (Signed)
Occupational Therapy Treatment Patient Details Name: Gerald Nelson MRN: [id number] DOB: 07/10/1950 Today's Date: 03/25/2021   History of present illness Patient is a 70 year old male who presentedto the hosptial with shortness of breath. patietn was found to have COPD exacerbation, acute renal failure and hypotension. PMH: CVA,HTN, CHF, COPD   OT comments  Patient demonstrated ability to perform bed mobility, ambulation in room, toileting and standing grooming task mostly with supervision only. Verbal cue to not get tangled in IV line. Patient demonstrated ability to bend down and pick up wash cloth her dropped under the sink. Patient reports he feels like he is at his baseline and "this is how I walk." Patient found on RA and sat 95% at rest and then 97% after ADL. Did exhibit some heavy breathing with activity but no complaints of dyspnea. Overall patient appears to be nears his baseline - in which he is mostly independent at his boarding house - needing assistance for medication management and rides from friends and family. Will continue to follow acutely to make sure patient consistent with activity.    Recommendations for follow up therapy are one component of a multi-disciplinary discharge planning process, led by the attending physician.  Recommendations may be updated based on patient status, additional functional criteria and insurance authorization.    Follow Up Recommendations  Supervision - Intermittent    Equipment Recommendations  None recommended by OT    Recommendations for Other Services      Precautions / Restrictions Precautions Precautions: Fall Precaution Comments: monitor O2 Restrictions Weight Bearing Restrictions: No       Mobility Bed Mobility Overal bed mobility: Needs Assistance Bed Mobility: Supine to Sit;Sit to Supine     Supine to sit: Supervision Sit to supine: Supervision   General bed mobility comments: Supervision for bed mobility.     Transfers Overall transfer level: Needs assistance Equipment used: None Transfers: Sit to/from Stand Sit to Stand: Supervision         General transfer comment: overall min guard to supervision in room to ambulate without device. Patient exhibits a "stiff" gait and when asked why he walks like that patient could only state "That's how I walk." Doesn't report pain in feet or neuropathy. No overt loss of balance. He needed verbal cues to not get tangled up in IV line.    Balance Overall balance assessment: Mild deficits observed, not formally tested                                         ADL either performed or assessed with clinical judgement   ADL Overall ADL's : Needs assistance/impaired     Grooming: Standing;Wash/dry face;Wash/dry hands;Supervision/safety Grooming Details (indicate cue type and reason): stood at sink to wash face and hands with supervision only                     Toileting- Water quality scientist and Hygiene: Supervision/safety;Sit to/from stand Toileting - Clothing Manipulation Details (indicate cue type and reason): stood to urinate in toilet - managed gown.             Vision Patient Visual Report: No change from baseline     Perception     Praxis      Cognition Arousal/Alertness: Awake/alert Behavior During Therapy: Flat affect Overall Cognitive Status: No family/caregiver present to determine baseline cognitive functioning  General Comments: From prior OT notes - patient noted to have cognitive impairments and memory deficits.        Exercises     Shoulder Instructions       General Comments      Pertinent Vitals/ Pain       Pain Assessment: No/denies pain  Home Living                                          Prior Functioning/Environment              Frequency  Min 2X/week        Progress Toward Goals  OT Goals(current  goals can now be found in the care plan section)  Progress towards OT goals: Progressing toward goals  Acute Rehab OT Goals Patient Stated Goal: none stated OT Goal Formulation: With patient Time For Goal Achievement: 04/06/21 Potential to Achieve Goals: Good  Plan Discharge plan needs to be updated    Co-evaluation          OT goals addressed during session: ADL's and self-care      AM-PAC OT "6 Clicks" Daily Activity     Outcome Measure   Help from another person eating meals?: None Help from another person taking care of personal grooming?: None Help from another person toileting, which includes using toliet, bedpan, or urinal?: None Help from another person bathing (including washing, rinsing, drying)?: None Help from another person to put on and taking off regular upper body clothing?: None Help from another person to put on and taking off regular lower body clothing?: None 6 Click Score: 24    End of Session    OT Visit Diagnosis: Unsteadiness on feet (R26.81);Muscle weakness (generalized) (M62.81)   Activity Tolerance Patient tolerated treatment well   Patient Left in bed;with call bell/phone within reach;with bed alarm set   Nurse Communication Mobility status        Time: [id number] OT Time Calculation (min): 13 min  Charges: OT General Charges $OT Visit: 1 Visit OT Treatments $Self Care/Home Management : 8-22 mins  Derl Barrow, OTR/L [place]  Office [phone] Pager: [place] 03/25/2021, 3:29 PM

## 2021-03-26 DIAGNOSIS — B9620 Unspecified Escherichia coli [E. coli] as the cause of diseases classified elsewhere: Secondary | ICD-10-CM | POA: Diagnosis not present

## 2021-03-26 DIAGNOSIS — R7881 Bacteremia: Secondary | ICD-10-CM | POA: Diagnosis not present

## 2021-03-26 DIAGNOSIS — J441 Chronic obstructive pulmonary disease with (acute) exacerbation: Secondary | ICD-10-CM | POA: Diagnosis not present

## 2021-03-26 DIAGNOSIS — J9601 Acute respiratory failure with hypoxia: Secondary | ICD-10-CM | POA: Diagnosis not present

## 2021-03-26 LAB — CULTURE, BLOOD (ROUTINE X 2): Culture: NO GROWTH

## 2021-03-26 LAB — BASIC METABOLIC PANEL
Anion gap: 7 (ref 5–15)
BUN: 35 mg/dL — ABNORMAL HIGH (ref 8–23)
CO2: 24 mmol/L (ref 22–32)
Calcium: 8.6 mg/dL — ABNORMAL LOW (ref 8.9–10.3)
Chloride: 108 mmol/L (ref 98–111)
Creatinine, Ser: 1.6 mg/dL — ABNORMAL HIGH (ref 0.61–1.24)
GFR, Estimated: 47 mL/min — ABNORMAL LOW (ref >60)
Glucose, Bld: 88 mg/dL (ref 70–99)
Potassium: 4.3 mmol/L (ref 3.5–5.1)
Sodium: 139 mmol/L (ref 135–145)

## 2021-03-26 LAB — MAGNESIUM: Magnesium: 1.6 mg/dL — ABNORMAL LOW (ref 1.7–2.4)

## 2021-03-26 MED ORDER — MAGNESIUM SULFATE 2 GM/50ML IV SOLN
2.0000 g | Freq: Once | INTRAVENOUS | Status: AC
  Administered 2021-03-26: 2 g via INTRAVENOUS
  Filled 2021-03-26: qty 50

## 2021-03-26 MED ORDER — PREDNISONE 20 MG PO TABS: 40.0000 mg | ORAL_TABLET | Freq: Every day | ORAL | 0 refills | Status: AC

## 2021-03-26 MED ORDER — HYDROXYZINE PAMOATE 25 MG PO CAPS: 25.0000 mg | ORAL_CAPSULE | Freq: Two times a day (BID) | ORAL | Status: DC | PRN

## 2021-03-26 MED ORDER — FLUTICASONE PROPIONATE 50 MCG/ACT NA SUSP: 2.0000 | Freq: Every day | NASAL | 0 refills | Status: DC | PRN

## 2021-03-26 MED ORDER — AMOXICILLIN-POT CLAVULANATE 875-125 MG PO TABS: 1.0000 | ORAL_TABLET | Freq: Two times a day (BID) | ORAL | 0 refills | Status: AC

## 2021-03-26 NOTE — Consult Note (Signed)
[facility] Inpatient Consult   03/26/2021  Gerald Nelson April 07, 1951 [id number]  [facility] [[facility]] Patient   Patient evaluated for community based chronic complex disease management services with [facility] due to noted high (33%) unplanned readmission risk score. Attempted to speak with patient at bedside to assess for post discharge needs and explain chronic care management services. Patient unavailable at the time.  Per review, patient refused SNF and returning home. Referral placed to [place] [facility] RN case manager for post hospital follow up.  Of note, [facility] Care Management services does not replace or interfere with any services that are arranged by inpatient case management or social work.   Netta Cedars, MSN, [facility] Liaison Nurse Mobile Phone [phone]  Toll free office [phone]

## 2021-03-26 NOTE — Care Management Important Message (Signed)
Important Message  Patient Details IM Letter given to the Patient. Name: Gerald Nelson MRN: [id number] Date of Birth: Dec 05, 1950   Medicare Important Message Given:  Yes     Kerin Salen 03/26/2021, 11:21 AM

## 2021-03-26 NOTE — Discharge Summary (Signed)
Physician Discharge Summary  Gerald Nelson [id number] DOB: 12-21-1950 DOA: 03/20/2021  PCP: [name], MD  Admit date: 03/20/2021 Discharge date: 03/26/2021  Admitted From: Home Disposition: Home.  Patient refused SNF placement  Recommendations for Outpatient Follow-up:  Follow up with PCP in 1 week with repeat CBC/BMP Follow up in ED if symptoms worsen or new appear   Home Health: Home health PT. Equipment/Devices: None  Discharge Condition: Stable CODE STATUS: Full Diet recommendation: Heart healthy  Brief/Interim Summary: 70 year old male with  history of COPD with ongoing tobacco use, prior CVA, hypertension, CHF, CKD 3, history of mitral regurg presented to the ED with hypoxia.  He was found to be hypotensive with leukocytosis along with elevated D-dimer.  He was treated for COPD exacerbation and was also started on antibiotics.  VQ scan was negative for DVT.  He was also started on IV fluids for AKI.  He was found to have E. coli bacteremia for which antibiotics were narrowed to IV Ancef.  He subsequently had possible aspiration event and antibiotics then changed to Unasyn and Zithromax.  PT recommended SNF placement but patient refused.  He is currently medically stable for discharge.  He will be discharged home on oral prednisone and Augmentin with home health PT.  Discharge Diagnoses:   Acute hypoxic respiratory failure Possible COPD exacerbation Ongoing tobacco use  -Needed BiPAP on presentation. -Treated with nebs and Solu-Medrol. -Patient was counseled regarding tobacco cessation by prior hospitalist -VQ scan showed very low probability of PE -Respiratory status has much improved: Currently on room air.  Discharge home on oral prednisone 40 mg daily for 7 days.  Continue home inhaler regimen.  Outpatient follow-up with PCP.  Might need evaluation by pulmonary as an outpatient.   Acute kidney injury on chronic kidney disease stage IIIb Acute metabolic  acidosis -Possibly prerenal.  Baseline creatinine 1.6-1.9.  Creatinine peaked to 4.5 -Much improved creatinine: 1.60 today.  Renal ultrasound negative for hydronephrosis -Urine cultures no growth so far. -ARB/diuretics on hold for now which will continue to remain on hold on discharge. -Off IV fluids and oral sodium bicarbonate.  -Acidosis has resolved.  Outpatient follow-up of BMP.    E. coli bacteremia Leukocytosis Sepsis ruled out -Leukocytosis improving.  Currently hemodynamically stable.  Patient afebrile.  Antibiotics were narrowed to IV Ancef but subsequently switched to IV Unasyn and Zithromax for possible aspiration pneumonia.  Zithromax discontinued on 03/25/2021. -Antibiotic plan as below.  Possible aspiration pneumonia -Currently on Unasyn.  Discharge home on oral Augmentin for 3 more days.  Hypertension Hypotension -Blood pressure has improved with IV fluids.  Continue bisoprolol and isosorbide hydralazine.  ARB and diuretics on hold till reevaluation by PCP.   Abnormal TSH -Free T4 within normal limits.  Outpatient follow-up of TSH within 4 to 6 weeks   Hyperlipidemia -Continue statin  History of mitral regurgitation -Monitor.  Outpatient follow-up with cardiology  History of unspecified CVA -Continue aspirin for stroke prophylaxis.  Continue statin  History of schizophrenia -Continue Haldol.  Outpatient follow-up with PCP and/or psychiatry  Chronic diastolic CHF -Most recent echo from 03/24/2020 showed EF of 55%.  Strict input and output.  Daily weights.   -Spironolactone/Lasix/ARB on hold for now -Continue bisoprolol, isosorbide and hydralazine.  Outpatient follow-up with cardiology   Generalized deconditioning -PT is recommending SNF placement.  Patient refused SNF placement.  Will need home health PT/OT.  Discharge Instructions  Discharge Instructions     Ambulatory referral to Cardiology   Complete by: As  directed    Diet - low sodium heart healthy    Complete by: As directed    Increase activity slowly   Complete by: As directed       Allergies as of 03/26/2021       Reactions   Sulfa Antibiotics Nausea Only        Medication List     STOP taking these medications    furosemide 20 MG tablet Commonly known as: LASIX   losartan 25 MG tablet Commonly known as: COZAAR   potassium chloride SA 20 MEQ tablet Commonly known as: KLOR-CON   spironolactone 25 MG tablet Commonly known as: ALDACTONE       TAKE these medications    albuterol 108 (90 Base) MCG/ACT inhaler Commonly known as: VENTOLIN HFA Inhale 1-2 puffs into the lungs every 6 (six) hours as needed for wheezing or shortness of breath.   amoxicillin-clavulanate 875-125 MG tablet Commonly known as: Augmentin Take 1 tablet by mouth 2 (two) times daily for 3 days.   Aspirin Low Dose 81 MG EC tablet Generic drug: aspirin TAKE ONE TABLET BY MOUTH ONCE DAILY (MORNING) What changed: See the new instructions.   atorvastatin 40 MG tablet Commonly known as: LIPITOR TAKE ONE TABLET BY MOUTH EVERY DAY IN THE EVENING   bisoprolol 10 MG tablet Commonly known as: ZEBETA TAKE 1 TABLET (10 MG TOTAL) BY MOUTH DAILY. (MORNING) What changed: See the new instructions.   fluticasone 50 MCG/ACT nasal spray Commonly known as: FLONASE Place 2 sprays into both nostrils daily as needed for allergies or rhinitis.   haloperidol 5 MG tablet Commonly known as: HALDOL Take 5 mg by mouth 2 (two) times daily.   haloperidol decanoate 100 MG/ML injection Commonly known as: HALDOL DECANOATE Inject 100 mg into the muscle every 28 (twenty-eight) days.   hydrOXYzine 25 MG capsule Commonly known as: VISTARIL Take 1 capsule (25 mg total) by mouth 2 (two) times daily as needed for anxiety. What changed:  when to take this reasons to take this   isosorbide-hydrALAZINE 20-37.5 MG tablet Commonly known as: BIDIL TAKE 1 TABLET BY MOUTH 3 (THREE) TIMES DAILY (AM+NOON+BEDTIME) What  changed: See the new instructions.   predniSONE 20 MG tablet Commonly known as: DELTASONE Take 2 tablets (40 mg total) by mouth daily with breakfast for 7 days.   Trelegy Ellipta 100-62.5-25 MCG/INH Aepb Generic drug: Fluticasone-Umeclidin-Vilant Inhale 1 puff into the lungs daily.        Follow-up Information     Clinic, [facility] 1 day.   Why: for shoulder abduction sling Contact information: [place] [address] [place], [facility] Follow up.   Specialty: [place] Why: They will call you to set up an appointment to come do physical therapy with you at home Contact information: [place] [address] [phone]         [name], MD Follow up.   Specialty: Internal Medicine Contact information: [address] [place] [address] [phone]         [name], MD. Schedule an appointment as soon as possible for a visit in 1 week(s).   Specialty: Internal Medicine Why: with cbc/bmp Contact information: [address] [place] [address] [phone]         [name], MD .   Specialty: Cardiology Contact information: [address]  [place] [address] [phone]                Allergies  Allergen Reactions   Sulfa Antibiotics Nausea Only    Consultations: None   Procedures/Studies: NM Pulmonary Perfusion  Result Date: 03/21/2021 CLINICAL DATA:  PE suspected EXAM: NUCLEAR MEDICINE PERFUSION LUNG SCAN TECHNIQUE: Perfusion images were obtained in multiple projections after intravenous injection of radiopharmaceutical. Ventilation scans intentionally deferred if perfusion scan and chest x-ray adequate for interpretation during COVID 19 epidemic. RADIOPHARMACEUTICALS:  4.35 mCi Tc-41mMAA IV COMPARISON:  Chest radiograph, 03/20/2021 FINDINGS: Per technologist, all views are acquired with  patient arms in a down position, which limits evaluation. Within this limitation, there is mildly heterogeneous perfusion of the lungs, in keeping with known emphysema, without suspicious peripheral or wedge-shaped perfusion defect. IMPRESSION: 1. Limited examination secondary to patient arm positioning. Within this limitation, very low probability for pulmonary embolism by modified perfusion only PIOPED criteria (PE absent). 2.  Heterogeneous perfusion of the lungs, in keeping with emphysema. Electronically Signed   By: AEddie CandleM.D.   On: 03/21/2021 13:46   [place]NAL  Result Date: 03/21/2021 CLINICAL DATA:  Acute renal failure. EXAM: RENAL / URINARY TRACT ULTRASOUND COMPLETE COMPARISON:  03/11/2021 and CT of the abdomen without contrast on 12/11/2013 FINDINGS: Right Kidney: Renal measurements: 10.2 x 5.2 x 5.1 cm = volume: 141 mL. Diffusely increased cortical echogenicity again noted without significant atrophy. No hydronephrosis or focal lesion. Left Kidney: Renal measurements: 10.9 x 5.7 x 6.4 cm = volume: 207 mL. Similar diffusely increased cortical echogenicity without atrophy. No hydronephrosis. Area of parenchymal irregularity noted by ultrasound at the level of lower pole cortex measuring on the order of 2 cm. This is not well seen and is felt to most likely be heterogeneity of the renal parenchyma and not a discrete focal lesion. Sonographic follow-up may be helpful. Bladder: The bladder is moderately distended and there is some component bladder wall thickening and irregularity. Other: None. IMPRESSION: 1. Bilateral echogenic kidneys likely reflecting chronic kidney disease. No evidence of hydronephrosis. 2. Area of parenchymal irregularity and heterogeneity along the lower pole of the left kidney is not well visualized and is felt to most likely be heterogeneity of the renal parenchyma. Follow-up ultrasound may be helpful. 3. Bladder wall irregularity with component of bladder wall thickening.  Some degree of underlying cystitis cannot be excluded. Electronically Signed   By: GAletta EdouardM.D.   On: 03/21/2021 11:33   [place]NAL  Result Date: 03/13/2021 CLINICAL DATA:  Chronic kidney disease EXAM: RENAL / URINARY TRACT ULTRASOUND COMPLETE COMPARISON:  CT abdomen/pelvis 12/11/2013 FINDINGS: Evaluation of the kidneys is limited due to patient body habitus and overlying bowel gas. Right Kidney: Renal measurements: 7.8 cm x 5.1 cm x 5.1 cm = volume: 107 mL. Parenchymal echogenicity is diffusely increased. There is no focal lesion or abnormality. No stones are identified. There is no hydronephrosis. Left Kidney: Renal measurements: 9.5 cm x 5.8 cm x 5.2 cm = volume: 148 mL. Parenchymal echogenicity is diffusely increased. There is no focal lesion or abnormality. No stones are identified. There is no hydronephrosis. Bladder: Appears normal for degree of bladder distention. Other: The prostate is enlarged. IMPRESSION: 1. Increased parenchymal echogenicity of both kidneys can be seen with medical renal disease. 2. Prostatomegaly. Electronically Signed   By: PValetta MoleM.D.   On: 03/13/2021 09:34   DG CHEST PORT 1 VIEW  Result Date: 03/23/2021 CLINICAL DATA:  70year old male with history  of shortness of breath. EXAM: PORTABLE CHEST 1 VIEW COMPARISON:  Chest x-ray 03/20/2021. FINDINGS: Ill-defined opacities are noted in the left lung base. Small left pleural effusion. Right lung is clear. No right pleural effusion. No pneumothorax. No evidence of pulmonary edema. Heart size is borderline enlarged. Upper mediastinal contours are within normal limits. Atherosclerotic calcifications in the thoracic aorta. IMPRESSION: 1. Ill-defined left lower lobe opacity concerning for probable bronchopneumonia. 2. Small left pleural effusion. 3. Aortic atherosclerosis. Electronically Signed   By: Vinnie Langton M.D.   On: 03/23/2021 15:11   DG Chest Portable 1 View  Result Date: 03/20/2021 CLINICAL DATA:  Dyspnea  with COPD exacerbation EXAM: PORTABLE CHEST 1 VIEW COMPARISON:  10/08/2020 FINDINGS: Cardiomegaly. Both lungs are clear. The visualized skeletal structures are unremarkable. IMPRESSION: Cardiomegaly without acute abnormality of the lungs in AP portable projection. Electronically Signed   By: Eddie Candle M.D.   On: 03/20/2021 17:42   VAS [place] LOWER EXTREMITY VENOUS (DVT)  Result Date: 03/22/2021  Lower Venous DVT Study Patient Name:  CHUONG VONBANK  Date of Exam:   03/21/2021 Medical Rec #: [id number]       Accession #:    [id number] Date of Birth: 1951/05/23        Patient Gender: M Patient Age:   13 years Exam Location:  [facility] Procedure:      VAS [place] LOWER EXTREMITY VENOUS (DVT) Referring Phys: Irine Seal --------------------------------------------------------------------------------  Indications: Swelling.  Comparison Study: no prior Performing Technologist: Archie Patten RVS  Examination Guidelines: A complete evaluation includes B-mode imaging, spectral Doppler, color Doppler, and power Doppler as needed of all accessible portions of each vessel. Bilateral testing is considered an integral part of a complete examination. Limited examinations for reoccurring indications may be performed as noted. The reflux portion of the exam is performed with the patient in reverse Trendelenburg.  +---------+---------------+---------+-----------+----------+--------------+ RIGHT    CompressibilityPhasicitySpontaneityPropertiesThrombus Aging +---------+---------------+---------+-----------+----------+--------------+ CFV      Full           Yes      Yes                                 +---------+---------------+---------+-----------+----------+--------------+ SFJ      Full                                                        +---------+---------------+---------+-----------+----------+--------------+ FV Prox  Full                                                         +---------+---------------+---------+-----------+----------+--------------+ FV Mid   Full                                                        +---------+---------------+---------+-----------+----------+--------------+ FV DistalFull                                                        +---------+---------------+---------+-----------+----------+--------------+  PFV      Full                                                        +---------+---------------+---------+-----------+----------+--------------+ POP      Full           Yes      Yes                                 +---------+---------------+---------+-----------+----------+--------------+ PTV      Full                                                        +---------+---------------+---------+-----------+----------+--------------+ PERO     Full                                                        +---------+---------------+---------+-----------+----------+--------------+   +---------+---------------+---------+-----------+----------+--------------+ LEFT     CompressibilityPhasicitySpontaneityPropertiesThrombus Aging +---------+---------------+---------+-----------+----------+--------------+ CFV      Full           Yes      Yes                                 +---------+---------------+---------+-----------+----------+--------------+ SFJ      Full                                                        +---------+---------------+---------+-----------+----------+--------------+ FV Prox  Full                                                        +---------+---------------+---------+-----------+----------+--------------+ FV Mid   Full                                                        +---------+---------------+---------+-----------+----------+--------------+ FV DistalFull                                                         +---------+---------------+---------+-----------+----------+--------------+ PFV      Full                                                        +---------+---------------+---------+-----------+----------+--------------+   POP      Full           Yes      Yes                                 +---------+---------------+---------+-----------+----------+--------------+ PTV      Full                                                        +---------+---------------+---------+-----------+----------+--------------+ PERO     Full                                                        +---------+---------------+---------+-----------+----------+--------------+     Summary: BILATERAL: - No evidence of deep vein thrombosis seen in the lower extremities, bilaterally. -No evidence of popliteal cyst, bilaterally.   *See table(s) above for measurements and observations. Electronically signed by Orlie Pollen on 03/22/2021 at 2:49:54 PM.    Final       Subjective: Patient seen and examined at bedside.  Feels much better and wants to go home today.  Refusing nursing home placement.  No overnight fever, vomiting, worsening shortness breath reported.  Discharge Exam: Vitals:   03/26/21 0723 03/26/21 0909  BP:  112/70  Pulse:    Resp:    Temp:    SpO2: 98%     General: Pt is alert, awake, not in acute distress.  Looks chronically ill and deconditioned.  Currently on room air. Cardiovascular: rate controlled, S1/S2 + Respiratory: bilateral decreased breath sounds at bases with some scattered crackles Abdominal: Soft, NT, ND, bowel sounds + Extremities: Trace lower extreme edema; no cyanosis    The results of significant diagnostics from this hospitalization (including imaging, microbiology, ancillary and laboratory) are listed below for reference.     Microbiology: Recent Results (from the past 240 hour(s))  Blood culture (routine x 2)     Status: Abnormal   Collection Time: 03/20/21   4:29 PM   Specimen: BLOOD RIGHT ARM  Result Value Ref Range Status   Specimen Description   Final    BLOOD RIGHT ARM Performed at [facility], [place] [address] [place], [place] [address]    Special Requests   Final    BOTTLES DRAWN AEROBIC AND ANAEROBIC Blood Culture adequate volume Performed at [facility] [address], [place], [place] [address]    Culture  Setup Time   Final    GRAM NEGATIVE RODS IN BOTH AEROBIC AND ANAEROBIC BOTTLES CRITICAL RESULT CALLED TO, READ BACK BY AND VERIFIED WITH: PHARM D C.SHADE ON [id number] AT 1135 BY E.PARRISH Performed at [facility], [place] [address], [place], [place] [address]    Culture ESCHERICHIA COLI (A)  Final   Report Status 03/23/2021 FINAL  Final   Organism ID, Bacteria ESCHERICHIA COLI  Final      Susceptibility   Escherichia coli - MIC*    AMPICILLIN <=2 SENSITIVE Sensitive     CEFAZOLIN <=4 SENSITIVE Sensitive     CEFEPIME <=0.12 SENSITIVE Sensitive     CEFTAZIDIME <=1 SENSITIVE Sensitive  CEFTRIAXONE <=0.25 SENSITIVE Sensitive     CIPROFLOXACIN <=0.25 SENSITIVE Sensitive     GENTAMICIN <=1 SENSITIVE Sensitive     IMIPENEM <=0.25 SENSITIVE Sensitive     TRIMETH/SULFA <=20 SENSITIVE Sensitive     AMPICILLIN/SULBACTAM <=2 SENSITIVE Sensitive     PIP/TAZO <=4 SENSITIVE Sensitive     * ESCHERICHIA COLI  Blood Culture ID Panel (Reflexed)     Status: Abnormal   Collection Time: 03/20/21  4:29 PM  Result Value Ref Range Status   Enterococcus faecalis NOT DETECTED NOT DETECTED Final   Enterococcus Faecium NOT DETECTED NOT DETECTED Final   Listeria monocytogenes NOT DETECTED NOT DETECTED Final   Staphylococcus species NOT DETECTED NOT DETECTED Final   Staphylococcus aureus (BCID) NOT DETECTED NOT DETECTED Final   Staphylococcus epidermidis NOT DETECTED NOT DETECTED Final   Staphylococcus lugdunensis NOT DETECTED NOT DETECTED Final   Streptococcus species NOT DETECTED NOT DETECTED Final    Streptococcus agalactiae NOT DETECTED NOT DETECTED Final   Streptococcus pneumoniae NOT DETECTED NOT DETECTED Final   Streptococcus pyogenes NOT DETECTED NOT DETECTED Final   A.calcoaceticus-baumannii NOT DETECTED NOT DETECTED Final   Bacteroides fragilis NOT DETECTED NOT DETECTED Final   Enterobacterales DETECTED (A) NOT DETECTED Final    Comment: Enterobacterales represent a large order of gram negative bacteria, not a single organism. CRITICAL RESULT CALLED TO, READ BACK BY AND VERIFIED WITH: PHARM D C.SHADE ON [id number] AT 1135 BY E.PARRISH    Enterobacter cloacae complex NOT DETECTED NOT DETECTED Final   Escherichia coli DETECTED (A) NOT DETECTED Final    Comment: CRITICAL RESULT CALLED TO, READ BACK BY AND VERIFIED WITH: PHARM D C.SHADE ON [id number] AT 1135 BY E.PARRISH    Klebsiella aerogenes NOT DETECTED NOT DETECTED Final   Klebsiella oxytoca NOT DETECTED NOT DETECTED Final   Klebsiella pneumoniae NOT DETECTED NOT DETECTED Final   Proteus species NOT DETECTED NOT DETECTED Final   Salmonella species NOT DETECTED NOT DETECTED Final   Serratia marcescens NOT DETECTED NOT DETECTED Final   Haemophilus influenzae NOT DETECTED NOT DETECTED Final   Neisseria meningitidis NOT DETECTED NOT DETECTED Final   Pseudomonas aeruginosa NOT DETECTED NOT DETECTED Final   Stenotrophomonas maltophilia NOT DETECTED NOT DETECTED Final   Candida albicans NOT DETECTED NOT DETECTED Final   Candida auris NOT DETECTED NOT DETECTED Final   Candida glabrata NOT DETECTED NOT DETECTED Final   Candida krusei NOT DETECTED NOT DETECTED Final   Candida parapsilosis NOT DETECTED NOT DETECTED Final   Candida tropicalis NOT DETECTED NOT DETECTED Final   Cryptococcus neoformans/gattii NOT DETECTED NOT DETECTED Final   CTX-M ESBL NOT DETECTED NOT DETECTED Final   Carbapenem resistance IMP NOT DETECTED NOT DETECTED Final   Carbapenem resistance KPC NOT DETECTED NOT DETECTED Final   Carbapenem resistance NDM NOT  DETECTED NOT DETECTED Final   Carbapenem resist OXA 48 LIKE NOT DETECTED NOT DETECTED Final   Carbapenem resistance VIM NOT DETECTED NOT DETECTED Final    Comment: Performed at [facility], [place] [address] [place], [place] [address]  Resp Panel by RT-PCR (Flu A&B, Covid) Nasopharyngeal Swab     Status: None   Collection Time: 03/20/21  7:28 PM   Specimen: Nasopharyngeal Swab; Nasopharyngeal(NP) swabs in vial transport medium  Result Value Ref Range Status   SARS Coronavirus 2 by RT PCR NEGATIVE NEGATIVE Final    Comment: (NOTE) SARS-CoV-2 target nucleic acids are NOT DETECTED.  The SARS-CoV-2 RNA is generally detectable in upper respiratory specimens during the  acute phase of infection. The lowest concentration of SARS-CoV-2 viral copies this assay can detect is 138 copies/mL. A negative result does not preclude SARS-Cov-2 infection and should not be used as the sole basis for treatment or other patient management decisions. A negative result may occur with  improper specimen collection/handling, submission of specimen other than nasopharyngeal swab, presence of viral mutation(s) within the areas targeted by this assay, and inadequate number of viral copies(<138 copies/mL). A negative result must be combined with clinical observations, patient history, and epidemiological information. The expected result is Negative.  Fact Sheet for Patients:  EntrepreneurPulse.com.au  Fact Sheet for Healthcare Providers:  IncredibleEmployment.be  This test is no t yet approved or cleared by the [place] FDA and  has been authorized for detection and/or diagnosis of SARS-CoV-2 by FDA under an Emergency Use Authorization (EUA). This EUA will remain  in effect (meaning this test can be used) for the duration of the COVID-19 declaration under Section 564(b)(1) of the Act, 21 U.S.C.section 360bbb-3(b)(1), unless the authorization is terminated  or  revoked sooner.       Influenza A by PCR NEGATIVE NEGATIVE Final   Influenza B by PCR NEGATIVE NEGATIVE Final    Comment: (NOTE) The Xpert Xpress SARS-CoV-2/FLU/RSV plus assay is intended as an aid in the diagnosis of influenza from Nasopharyngeal swab specimens and should not be used as a sole basis for treatment. Nasal washings and aspirates are unacceptable for Xpert Xpress SARS-CoV-2/FLU/RSV testing.  Fact Sheet for Patients: EntrepreneurPulse.com.au  Fact Sheet for Healthcare Providers: IncredibleEmployment.be  This test is not yet approved or cleared by the [place] FDA and has been authorized for detection and/or diagnosis of SARS-CoV-2 by FDA under an Emergency Use Authorization (EUA). This EUA will remain in effect (meaning this test can be used) for the duration of the COVID-19 declaration under Section 564(b)(1) of the Act, 21 U.S.C. section 360bbb-3(b)(1), unless the authorization is terminated or revoked.  Performed at [facility], [place] [address], [place], [place] [address]   Blood culture (routine x 2)     Status: None (Preliminary result)   Collection Time: 03/20/21  9:46 PM   Specimen: BLOOD  Result Value Ref Range Status   Specimen Description   Final    BLOOD BLOOD RIGHT HAND Performed at [facility] [address], [place], [place] [address]    Special Requests   Final    BOTTLES DRAWN AEROBIC ONLY Blood Culture results may not be optimal due to an inadequate volume of blood received in culture bottles Performed at [facility] [address], [place], [place] [address]    Culture   Final    NO GROWTH 4 DAYS Performed at [facility], [place] [address], [place], [place] [address]    Report Status PENDING  Incomplete  MRSA Next Gen by PCR, Nasal     Status: None   Collection Time: 03/20/21  9:58 PM   Specimen: Nasal Mucosa; Nasal Swab  Result  Value Ref Range Status   MRSA by PCR Next Gen NOT DETECTED NOT DETECTED Final    Comment: (NOTE) The GeneXpert MRSA Assay (FDA approved for NASAL specimens only), is one component of a comprehensive MRSA colonization surveillance program. It is not intended to diagnose MRSA infection nor to guide or monitor treatment for MRSA infections. Test performance is not FDA approved in patients less than 70 years old. Performed at [facility], [place]  [address] [place], [place] [address]   Urine Culture     Status: None   Collection Time: 03/21/21  9:58 AM   Specimen: Urine, Catheterized  Result Value Ref Range Status   Specimen Description   Final    URINE, CATHETERIZED Performed at [facility] [address] [place], [place] [address]    Special Requests   Final    NONE Performed at [facility], [place] [address], [place], [place] [address]    Culture   Final    NO GROWTH Performed at [facility], [place] [address], [place], [place] [address]    Report Status 03/22/2021 FINAL  Final  SARS CORONAVIRUS 2 (TAT 6-24 HRS) Nasopharyngeal Nasopharyngeal Swab     Status: None   Collection Time: 03/25/21 10:50 AM   Specimen: Nasopharyngeal Swab  Result Value Ref Range Status   SARS Coronavirus 2 NEGATIVE NEGATIVE Final    Comment: (NOTE) SARS-CoV-2 target nucleic acids are NOT DETECTED.  The SARS-CoV-2 RNA is generally detectable in upper and lower respiratory specimens during the acute phase of infection. Negative results do not preclude SARS-CoV-2 infection, do not rule out co-infections with other pathogens, and should not be used as the sole basis for treatment or other patient management decisions. Negative results must be combined with clinical observations, patient history, and epidemiological information. The expected result is Negative.  Fact Sheet for Patients: SugarRoll.be  Fact Sheet  for Healthcare Providers: https://www.woods-mathews.com/  This test is not yet approved or cleared by the [place] FDA and  has been authorized for detection and/or diagnosis of SARS-CoV-2 by FDA under an Emergency Use Authorization (EUA). This EUA will remain  in effect (meaning this test can be used) for the duration of the COVID-19 declaration under Se ction 564(b)(1) of the Act, 21 U.S.C. section 360bbb-3(b)(1), unless the authorization is terminated or revoked sooner.  Performed at [facility], [place] [address] [place], [place] [address]      Labs: BNP (last 3 results) Recent Labs    03/20/21 1628  BNP [id number]*   Basic Metabolic Panel: Recent Labs  Lab 03/22/21 0303 03/23/21 0424 03/24/21 0521 03/25/21 0455 03/26/21 0838  NA 145 141 139 139 139  K 4.2 4.0 4.0 4.9 4.3  CL 115* 113* 110 111 108  CO2 20* 19* 21* 22 24  GLUCOSE 159* 117* 127* 126* 88  BUN 82* 66* 54* 45* 35*  CREATININE 2.97* 2.58* 1.95* 1.79* 1.60*  CALCIUM 8.6* 8.1* 8.0* 8.3* 8.6*  MG 2.6* 2.1  --  1.7 1.6*  PHOS  --  2.5  --  3.7  --    Liver Function Tests: Recent Labs  Lab 03/20/21 1628 03/21/21 0252 03/22/21 0303 03/23/21 0424 03/25/21 0455  AST '29 27 18  '$ --   --   ALT '23 22 19  '$ --   --   ALKPHOS 76 59 62  --   --   BILITOT 1.7* 1.2 0.7  --   --   PROT 6.8 6.4* 6.1*  --   --   ALBUMIN 2.9* 2.8* 2.5* 2.4* 2.5*   No results for input(s): LIPASE, AMYLASE in the last 168 hours. No results for input(s): AMMONIA in the last 168 hours. CBC: Recent Labs  Lab 03/20/21 1628 03/20/21 1717 03/21/21 0252 03/22/21 0303 03/23/21 0424 03/24/21 0521 03/25/21 0455  WBC 23.4*  --  19.9* 20.2* 15.1* 13.1* 12.4*  NEUTROABS 19.8*  --   --  16.5*  11.3* 9.8* 9.6*  HGB 11.2*   < > 10.7* 10.7* 9.8* 8.8* 9.2*  HCT 32.1*   < > 30.5* 30.6* 27.8* 24.9* 26.7*  MCV 85.6  --  84.5 84.3 84.0 84.4 85.3  PLT 111*  --  99* 96* 79* 82* 125*   < > = values in this interval not displayed.    Cardiac Enzymes: No results for input(s): CKTOTAL, CKMB, CKMBINDEX, TROPONINI in the last 168 hours. BNP: Invalid input(s): POCBNP CBG: No results for input(s): GLUCAP in the last 168 hours. D-Dimer No results for input(s): DDIMER in the last 72 hours. Hgb A1c No results for input(s): HGBA1C in the last 72 hours. Lipid Profile No results for input(s): CHOL, HDL, LDLCALC, TRIG, CHOLHDL, LDLDIRECT in the last 72 hours. Thyroid function studies No results for input(s): TSH, T4TOTAL, T3FREE, THYROIDAB in the last 72 hours.  Invalid input(s): FREET3 Anemia work up No results for input(s): VITAMINB12, FOLATE, FERRITIN, TIBC, IRON, RETICCTPCT in the last 72 hours. Urinalysis    Component Value Date/Time   COLORURINE YELLOW 03/21/2021 0958   APPEARANCEUR HAZY (A) 03/21/2021 0958   LABSPEC 1.011 03/21/2021 0958   PHURINE 5.0 03/21/2021 0958   GLUCOSEU NEGATIVE 03/21/2021 0958   HGBUR NEGATIVE 03/21/2021 0958   BILIRUBINUR NEGATIVE 03/21/2021 0958   KETONESUR NEGATIVE 03/21/2021 0958   PROTEINUR NEGATIVE 03/21/2021 0958   UROBILINOGEN 1.0 12/11/2013 1733   NITRITE NEGATIVE 03/21/2021 0958   LEUKOCYTESUR NEGATIVE 03/21/2021 0958   Sepsis Labs Invalid input(s): PROCALCITONIN,  WBC,  LACTICIDVEN Microbiology Recent Results (from the past 240 hour(s))  Blood culture (routine x 2)     Status: Abnormal   Collection Time: 03/20/21  4:29 PM   Specimen: BLOOD RIGHT ARM  Result Value Ref Range Status   Specimen Description   Final    BLOOD RIGHT ARM Performed at [facility], [place] [address], [place], [place] [address]    Special Requests   Final    BOTTLES DRAWN AEROBIC AND ANAEROBIC Blood Culture adequate volume Performed at [facility] [address] [place], [place] [address]    Culture  Setup Time   Final    GRAM NEGATIVE RODS IN BOTH AEROBIC AND ANAEROBIC BOTTLES CRITICAL RESULT CALLED TO, READ BACK BY AND VERIFIED WITH: PHARM D  C.SHADE ON [id number] AT 1135 BY E.PARRISH Performed at [facility], [place] [address], [place], [place] [address]    Culture ESCHERICHIA COLI (A)  Final   Report Status 03/23/2021 FINAL  Final   Organism ID, Bacteria ESCHERICHIA COLI  Final      Susceptibility   Escherichia coli - MIC*    AMPICILLIN <=2 SENSITIVE Sensitive     CEFAZOLIN <=4 SENSITIVE Sensitive     CEFEPIME <=0.12 SENSITIVE Sensitive     CEFTAZIDIME <=1 SENSITIVE Sensitive     CEFTRIAXONE <=0.25 SENSITIVE Sensitive     CIPROFLOXACIN <=0.25 SENSITIVE Sensitive     GENTAMICIN <=1 SENSITIVE Sensitive     IMIPENEM <=0.25 SENSITIVE Sensitive     TRIMETH/SULFA <=20 SENSITIVE Sensitive     AMPICILLIN/SULBACTAM <=2 SENSITIVE Sensitive     PIP/TAZO <=4 SENSITIVE Sensitive     * ESCHERICHIA COLI  Blood Culture ID Panel (Reflexed)     Status: Abnormal   Collection Time: 03/20/21  4:29 PM  Result Value Ref Range Status   Enterococcus faecalis NOT DETECTED NOT DETECTED Final   Enterococcus Faecium NOT DETECTED NOT DETECTED Final   Listeria monocytogenes NOT DETECTED NOT DETECTED  Final   Staphylococcus species NOT DETECTED NOT DETECTED Final   Staphylococcus aureus (BCID) NOT DETECTED NOT DETECTED Final   Staphylococcus epidermidis NOT DETECTED NOT DETECTED Final   Staphylococcus lugdunensis NOT DETECTED NOT DETECTED Final   Streptococcus species NOT DETECTED NOT DETECTED Final   Streptococcus agalactiae NOT DETECTED NOT DETECTED Final   Streptococcus pneumoniae NOT DETECTED NOT DETECTED Final   Streptococcus pyogenes NOT DETECTED NOT DETECTED Final   A.calcoaceticus-baumannii NOT DETECTED NOT DETECTED Final   Bacteroides fragilis NOT DETECTED NOT DETECTED Final   Enterobacterales DETECTED (A) NOT DETECTED Final    Comment: Enterobacterales represent a large order of gram negative bacteria, not a single organism. CRITICAL RESULT CALLED TO, READ BACK BY AND VERIFIED WITH: PHARM D C.SHADE ON [id number] AT 1135 BY E.PARRISH     Enterobacter cloacae complex NOT DETECTED NOT DETECTED Final   Escherichia coli DETECTED (A) NOT DETECTED Final    Comment: CRITICAL RESULT CALLED TO, READ BACK BY AND VERIFIED WITH: PHARM D C.SHADE ON [id number] AT 1135 BY E.PARRISH    Klebsiella aerogenes NOT DETECTED NOT DETECTED Final   Klebsiella oxytoca NOT DETECTED NOT DETECTED Final   Klebsiella pneumoniae NOT DETECTED NOT DETECTED Final   Proteus species NOT DETECTED NOT DETECTED Final   Salmonella species NOT DETECTED NOT DETECTED Final   Serratia marcescens NOT DETECTED NOT DETECTED Final   Haemophilus influenzae NOT DETECTED NOT DETECTED Final   Neisseria meningitidis NOT DETECTED NOT DETECTED Final   Pseudomonas aeruginosa NOT DETECTED NOT DETECTED Final   Stenotrophomonas maltophilia NOT DETECTED NOT DETECTED Final   Candida albicans NOT DETECTED NOT DETECTED Final   Candida auris NOT DETECTED NOT DETECTED Final   Candida glabrata NOT DETECTED NOT DETECTED Final   Candida krusei NOT DETECTED NOT DETECTED Final   Candida parapsilosis NOT DETECTED NOT DETECTED Final   Candida tropicalis NOT DETECTED NOT DETECTED Final   Cryptococcus neoformans/gattii NOT DETECTED NOT DETECTED Final   CTX-M ESBL NOT DETECTED NOT DETECTED Final   Carbapenem resistance IMP NOT DETECTED NOT DETECTED Final   Carbapenem resistance KPC NOT DETECTED NOT DETECTED Final   Carbapenem resistance NDM NOT DETECTED NOT DETECTED Final   Carbapenem resist OXA 48 LIKE NOT DETECTED NOT DETECTED Final   Carbapenem resistance VIM NOT DETECTED NOT DETECTED Final    Comment: Performed at [facility], [place] [address], [place], [place] [address]  Resp Panel by RT-PCR (Flu A&B, Covid) Nasopharyngeal Swab     Status: None   Collection Time: 03/20/21  7:28 PM   Specimen: Nasopharyngeal Swab; Nasopharyngeal(NP) swabs in vial transport medium  Result Value Ref Range Status   SARS Coronavirus 2 by RT PCR NEGATIVE NEGATIVE Final    Comment: (NOTE) SARS-CoV-2  target nucleic acids are NOT DETECTED.  The SARS-CoV-2 RNA is generally detectable in upper respiratory specimens during the acute phase of infection. The lowest concentration of SARS-CoV-2 viral copies this assay can detect is 138 copies/mL. A negative result does not preclude SARS-Cov-2 infection and should not be used as the sole basis for treatment or other patient management decisions. A negative result may occur with  improper specimen collection/handling, submission of specimen other than nasopharyngeal swab, presence of viral mutation(s) within the areas targeted by this assay, and inadequate number of viral copies(<138 copies/mL). A negative result must be combined with clinical observations, patient history, and epidemiological information. The expected result is Negative.  Fact Sheet for Patients:  EntrepreneurPulse.com.au  Fact Sheet for Healthcare Providers:  IncredibleEmployment.be  This test is no t yet approved or cleared by the [place] and  has been authorized for detection and/or diagnosis of SARS-CoV-2 by FDA under an Emergency Use Authorization (EUA). This EUA will remain  in effect (meaning this test can be used) for the duration of the COVID-19 declaration under Section 564(b)(1) of the Act, 21 U.S.C.section 360bbb-3(b)(1), unless the authorization is terminated  or revoked sooner.       Influenza A by PCR NEGATIVE NEGATIVE Final   Influenza B by PCR NEGATIVE NEGATIVE Final    Comment: (NOTE) The Xpert Xpress SARS-CoV-2/FLU/RSV plus assay is intended as an aid in the diagnosis of influenza from Nasopharyngeal swab specimens and should not be used as a sole basis for treatment. Nasal washings and aspirates are unacceptable for Xpert Xpress SARS-CoV-2/FLU/RSV testing.  Fact Sheet for Patients: EntrepreneurPulse.com.au  Fact Sheet for Healthcare  Providers: IncredibleEmployment.be  This test is not yet approved or cleared by the [place] FDA and has been authorized for detection and/or diagnosis of SARS-CoV-2 by FDA under an Emergency Use Authorization (EUA). This EUA will remain in effect (meaning this test can be used) for the duration of the COVID-19 declaration under Section 564(b)(1) of the Act, 21 U.S.C. section 360bbb-3(b)(1), unless the authorization is terminated or revoked.  Performed at [facility], [place] [address] [place], [place] [address]   Blood culture (routine x 2)     Status: None (Preliminary result)   Collection Time: 03/20/21  9:46 PM   Specimen: BLOOD  Result Value Ref Range Status   Specimen Description   Final    BLOOD BLOOD RIGHT HAND Performed at [facility] [address] [place], [place] [address]    Special Requests   Final    BOTTLES DRAWN AEROBIC ONLY Blood Culture results may not be optimal due to an inadequate volume of blood received in culture bottles Performed at [facility] [address] [place], [place] [address]    Culture   Final    NO GROWTH 4 DAYS Performed at [facility], [place] [address], [place], [place] [address]    Report Status PENDING  Incomplete  MRSA Next Gen by PCR, Nasal     Status: None   Collection Time: 03/20/21  9:58 PM   Specimen: Nasal Mucosa; Nasal Swab  Result Value Ref Range Status   MRSA by PCR Next Gen NOT DETECTED NOT DETECTED Final    Comment: (NOTE) The GeneXpert MRSA Assay (FDA approved for NASAL specimens only), is one component of a comprehensive MRSA colonization surveillance program. It is not intended to diagnose MRSA infection nor to guide or monitor treatment for MRSA infections. Test performance is not FDA approved in patients less than 29 years old. Performed at [facility], [place] [address] [place], [place] [address]   Urine  Culture     Status: None   Collection Time: 03/21/21  9:58 AM   Specimen: Urine, Catheterized  Result Value Ref Range Status   Specimen Description   Final    URINE, CATHETERIZED Performed at [facility] [address] [place], [place] [address]    Special Requests   Final    NONE Performed at [facility], [place] [address] [place], [place] [address]    Culture   Final    NO GROWTH Performed at [facility], [place] [address] [place], [place] [address]    Report  Status 03/22/2021 FINAL  Final  SARS CORONAVIRUS 2 (TAT 6-24 HRS) Nasopharyngeal Nasopharyngeal Swab     Status: None   Collection Time: 03/25/21 10:50 AM   Specimen: Nasopharyngeal Swab  Result Value Ref Range Status   SARS Coronavirus 2 NEGATIVE NEGATIVE Final    Comment: (NOTE) SARS-CoV-2 target nucleic acids are NOT DETECTED.  The SARS-CoV-2 RNA is generally detectable in upper and lower respiratory specimens during the acute phase of infection. Negative results do not preclude SARS-CoV-2 infection, do not rule out co-infections with other pathogens, and should not be used as the sole basis for treatment or other patient management decisions. Negative results must be combined with clinical observations, patient history, and epidemiological information. The expected result is Negative.  Fact Sheet for Patients: SugarRoll.be  Fact Sheet for Healthcare Providers: https://www.woods-mathews.com/  This test is not yet approved or cleared by the [place] FDA and  has been authorized for detection and/or diagnosis of SARS-CoV-2 by FDA under an Emergency Use Authorization (EUA). This EUA will remain  in effect (meaning this test can be used) for the duration of the COVID-19 declaration under Se ction 564(b)(1) of the Act, 21 U.S.C. section 360bbb-3(b)(1), unless the authorization is terminated or revoked sooner.  Performed at  [facility], [place] [address] [place], [place] [address]      Time coordinating discharge: 35 minutes  SIGNED:   Aline August, MD  [facility] 03/26/2021, 11:30 AM

## 2021-03-27 ENCOUNTER — Other Ambulatory Visit: Payer: Self-pay | Admitting: *Deleted

## 2021-03-27 ENCOUNTER — Other Ambulatory Visit: Payer: Medicare Other | Admitting: *Deleted

## 2021-03-27 NOTE — Patient Outreach (Signed)
[facility]) Care Management  03/27/2021  BRAEDAN VARIN 1950/07/22 [id number]   Referral Received 9/22 Initial Outreach   Transition of care to be completed by Provider  RN spoke with pt briefly concerning introduction of [facility] and verified pt who requested a call back later today.   RN will attempt another outreach later today for pending services.  Raina Mina, RN Care Management Coordinator [place] Office [phone]

## 2021-03-27 NOTE — Patient Outreach (Signed)
[facility]) Care Management  03/27/2021  Gerald Nelson Jun 26, 1951 [id number]   Telephone Assessment-Unsuccessful  RN attempted a call back as requested however unsuccessful. No answer and unable to leave a HIPAA approved voice message.  Will attempted another call back over the next week.  Raina Mina, RN Care Management Coordinator [place] Office [phone]

## 2021-03-30 ENCOUNTER — Other Ambulatory Visit (HOSPITAL_COMMUNITY): Payer: Self-pay | Admitting: Cardiology

## 2021-03-30 ENCOUNTER — Other Ambulatory Visit: Payer: Self-pay

## 2021-03-30 ENCOUNTER — Encounter (HOSPITAL_COMMUNITY): Payer: Self-pay | Admitting: Cardiology

## 2021-03-30 ENCOUNTER — Ambulatory Visit (HOSPITAL_COMMUNITY)
Admission: RE | Admit: 2021-03-30 | Discharge: 2021-03-30 | Disposition: A | Discharge status: Home / Self Care | Payer: Medicare Other | Source: Ambulatory Visit | Attending: Cardiology | Admitting: Cardiology

## 2021-03-30 VITALS — BP 124/70 | HR 98 | Wt 140.8 lb

## 2021-03-30 DIAGNOSIS — I2510 Atherosclerotic heart disease of native coronary artery without angina pectoris: Secondary | ICD-10-CM | POA: Diagnosis not present

## 2021-03-30 DIAGNOSIS — F17210 Nicotine dependence, cigarettes, uncomplicated: Secondary | ICD-10-CM | POA: Diagnosis not present

## 2021-03-30 DIAGNOSIS — E785 Hyperlipidemia, unspecified: Secondary | ICD-10-CM

## 2021-03-30 DIAGNOSIS — I130 Hypertensive heart and chronic kidney disease with heart failure and stage 1 through stage 4 chronic kidney disease, or unspecified chronic kidney disease: Secondary | ICD-10-CM | POA: Insufficient documentation

## 2021-03-30 DIAGNOSIS — Z8249 Family history of ischemic heart disease and other diseases of the circulatory system: Secondary | ICD-10-CM | POA: Diagnosis not present

## 2021-03-30 DIAGNOSIS — R7989 Other specified abnormal findings of blood chemistry: Secondary | ICD-10-CM | POA: Diagnosis not present

## 2021-03-30 DIAGNOSIS — I272 Pulmonary hypertension, unspecified: Secondary | ICD-10-CM

## 2021-03-30 DIAGNOSIS — N183 Chronic kidney disease, stage 3 unspecified: Secondary | ICD-10-CM | POA: Diagnosis not present

## 2021-03-30 DIAGNOSIS — Z79899 Other long term (current) drug therapy: Secondary | ICD-10-CM | POA: Insufficient documentation

## 2021-03-30 DIAGNOSIS — Z7982 Long term (current) use of aspirin: Secondary | ICD-10-CM | POA: Diagnosis not present

## 2021-03-30 DIAGNOSIS — I5032 Chronic diastolic (congestive) heart failure: Secondary | ICD-10-CM

## 2021-03-30 DIAGNOSIS — Z7952 Long term (current) use of systemic steroids: Secondary | ICD-10-CM | POA: Diagnosis not present

## 2021-03-30 DIAGNOSIS — I5042 Chronic combined systolic (congestive) and diastolic (congestive) heart failure: Secondary | ICD-10-CM | POA: Insufficient documentation

## 2021-03-30 DIAGNOSIS — Z8673 Personal history of transient ischemic attack (TIA), and cerebral infarction without residual deficits: Secondary | ICD-10-CM | POA: Insufficient documentation

## 2021-03-30 DIAGNOSIS — F209 Schizophrenia, unspecified: Secondary | ICD-10-CM | POA: Insufficient documentation

## 2021-03-30 DIAGNOSIS — R0602 Shortness of breath: Secondary | ICD-10-CM | POA: Diagnosis present

## 2021-03-30 DIAGNOSIS — I081 Rheumatic disorders of both mitral and tricuspid valves: Secondary | ICD-10-CM | POA: Insufficient documentation

## 2021-03-30 DIAGNOSIS — J449 Chronic obstructive pulmonary disease, unspecified: Secondary | ICD-10-CM | POA: Diagnosis not present

## 2021-03-30 LAB — BASIC METABOLIC PANEL
Anion gap: 5 (ref 5–15)
BUN: 36 mg/dL — ABNORMAL HIGH (ref 8–23)
CO2: 29 mmol/L (ref 22–32)
Calcium: 9.1 mg/dL (ref 8.9–10.3)
Chloride: 108 mmol/L (ref 98–111)
Creatinine, Ser: 1.66 mg/dL — ABNORMAL HIGH (ref 0.61–1.24)
GFR, Estimated: 45 mL/min — ABNORMAL LOW (ref >60)
Glucose, Bld: 107 mg/dL — ABNORMAL HIGH (ref 70–99)
Potassium: 5.1 mmol/L (ref 3.5–5.1)
Sodium: 142 mmol/L (ref 135–145)

## 2021-03-30 LAB — LIPID PANEL
Cholesterol: 151 mg/dL (ref 0–200)
HDL: 75 mg/dL (ref >40)
LDL Cholesterol: 63 mg/dL (ref 0–99)
Total CHOL/HDL Ratio: 2 RATIO
Triglycerides: 63 mg/dL (ref <150)
VLDL: 13 mg/dL (ref 0–40)

## 2021-03-30 LAB — BRAIN NATRIURETIC PEPTIDE: B Natriuretic Peptide: 139 pg/mL — ABNORMAL HIGH (ref 0.0–100.0)

## 2021-03-30 MED ORDER — ISOSORB DINITRATE-HYDRALAZINE 20-37.5 MG PO TABS
ORAL_TABLET | ORAL | 6 refills | Status: DC
Start: 2021-03-30 — End: 2022-04-13

## 2021-03-30 MED ORDER — FUROSEMIDE 20 MG PO TABS: 20.0000 mg | ORAL_TABLET | Freq: Every day | ORAL | 11 refills | Status: DC

## 2021-03-30 MED ORDER — ASPIRIN 81 MG PO TBEC: DELAYED_RELEASE_TABLET | ORAL | 3 refills | Status: DC

## 2021-03-30 NOTE — Patient Instructions (Addendum)
EKG done today.  Labs done today. We will contact you only if your labs are abnormal.  RESTART Aspirin '81mg'$  (1 tablet) by mouth daily.   RESTART Bidil 1 tablet by mouth 3 ties daily.   START Lasix '40mg'$  (2 tablets) by mouth daily for 3 days THEN DECREASE down to 1 tablet by mouth daily.   No other medication changes were made. Please continue all current medications as prescribed.  You have been referred to [facility]. They will contact you to schedule an appointment.   You have been referred to [facility]. They will contact you to schedule an appointment.   Your physician recommends that you schedule a follow-up appointment in: 1 week and soon for an echo  Your physician has requested that you have an echocardiogram. Echocardiography is a painless test that uses sound waves to create images of your heart. It provides your doctor with information about the size and shape of your heart and how well your heart's chambers and valves are working. This procedure takes approximately one hour. There are no restrictions for this procedure.  If you have any questions or concerns before your next appointment please send [place] a message through [place] or call our office at [phone].    TO LEAVE A MESSAGE FOR THE NURSE SELECT OPTION 2, PLEASE LEAVE A MESSAGE INCLUDING: YOUR NAME DATE OF BIRTH CALL BACK NUMBER REASON FOR CALL**this is important as we prioritize the call backs  YOU WILL RECEIVE A CALL BACK THE SAME DAY AS LONG AS YOU CALL BEFORE 4:00 PM   Do the following things EVERYDAY: Weigh yourself in the morning before breakfast. Write it down and keep it in a log. Take your medicines as prescribed Eat low salt foods--Limit salt (sodium) to 2000 mg per day.  Stay as active as you can everyday Limit all fluids for the day to less than 2 liters   At the [facility], you and your health needs are our priority. As part of our continuing mission to provide you  with exceptional heart care, we have created designated Provider Care Teams. These Care Teams include your primary Cardiologist (physician) and Advanced Practice Providers (APPs- Physician Assistants and Nurse Practitioners) who all work together to provide you with the care you need, when you need it.   You may see any of the following providers on your designated Care Team at your next follow up: Dr Glori Bickers Dr Haynes Kerns, NP Lyda Jester, [place] Audry Riles, PharmD   Please be sure to bring in all your medications bottles to every appointment.

## 2021-03-31 NOTE — Progress Notes (Signed)
Date:  03/31/2021   ID:  Gerald Nelson, DOB Oct 18, 1950, MRN [id number]   Provider location: [place] Type of Visit: Established patient   PCP:  [name], MD  Cardiologist:  Dr. Aundra Dubin  Chief Complaint: Shortness of breath   History of Present Illness: Gerald Nelson is a 70 y.o. male who has a past medical history of chronic systolic CHF (EF 0000000 in July 2018), severe MR/TR, pulmonary HTN, CVA, CKD, schizophrenia, HTN and tobacco abuse.    Gerald Nelson was admitted to [facility] 7/2-01/06/17 for newly diagnosed acute systolic CHF. Echo showed LVEF 15%, severe global hypokinesis, moderate LVH, coarse trabeculation of the LV apex with numerous false tendinae of the left ventricle, very stagnant blood flow at the LV apex with smoke but no obvious LV thrombus - Definity contrast was given, again, noting stagnant apical blood flow - this could suggest  recent thrombus and certainly high risk for apical thrombus  formation. Other findings include aortic sclerosis with mild AI, moderate to severe MR, moderate LAE, mild RAE, moderate to severe TR, moderate to severe pulmonary hypertension (RVSP 73 mmHg), dilated IVC, trivial posterior pericardial effusion.    He was admitted again in 10/18. He diuresed 25 pounds on IV lasix. He refused cath. His clonidine and diltazem were stopped during this admission. Referred to paramedicine. Discharge weight 133 pounds.    Echo 08/11/17 LVEF 15-20%, no MR noted.   Echo in 7/20 showed improvement in EF to 50-55%, moderate LVH, normal RV size and systolic function.  Echo in 9/21 showed EF 55% with moderate LVH, RV normal, IVC normal.  PYP scan in 11/21 was not suggestive of TTR cardiac amyloidosis.   Patient was admitted in 9/22 with AKI.  He was found to have COPD exacerbation.  V/Q scan was negative.  He had E coli bacteremia and there was also concern for aspiration PNA.  Creatinine peaked at 4.6 then improved.  He had been on Lasix 20 mg  daily prior to admission, this was stopped. Losartan and spironolactone were also stopped. He was sent home on Augmentin and prednisone for COPD exacerbation.    He presents today for followup of CHF.  He is still smoking 1 ppd.  He is wheezing and appears quite short of breath though oxygen saturation is ok at rest.  He is tachypneic.  No chest pain.  No lightheadedness.  He is not sure what medications he is taking.  He called someone at his house who found Augmentin and prednisone but not other pills.  He says he is using his nebulizer.  Weight is up 7 lbs.    ECG (personally reviewed): NSR, ?old ASMI   Labs (6/19): K 3.8, creatinine 2.2 Labs (11/19): K 4.1, creatinine 1.95, LDL 54, HDl 54 Labs (12/19): K 4.6, creatinine 1.89 LabS (2/20): K 4.5, creatinine 1.68  Labs (4/20): K 4.5, creatinine 1.58 Labs (9/20): K 5.1, creatinine 2.02 Labs (10/20): LDL 67, K 4.8, creatinine 1.92 Labs (3/21): K 4.8, creatinine 1.97 Labs (9/22): K 4.3, creatinine 1.6   PMH: 1. Chronic systolic CHF:  Cardiomyopathy of uncertain etiology, refused cath. He also refused ICD.  - Echo (7/18): Moderate LVH, severe FBSH, trabeculation at apex not meeting criteria for noncompaction, EF 15%, moderate to severe MR, low normal RV systolic function, moderate-severe TR.  - Echo (2/19): Moderate LVH, EF 15-20%, No mitral regurgitation, normal RV size and systolic function, mild TR.  - Echo (7/20): EF 50-55%, moderate  LVH, normal RV size and systolic function.  - Echo (9/21): EF 55%, moderate LVH, RV normal, IVC normal.  - PYP scan (11/21): No evidence for TTR cardiac amyloidosis.  2. H/o CVA 3. Schizophrenia 4. CKD stage 3 5. Mitral regurgitation: Suspect functional. Moderate-severe MR on 7/18 echo, minimal MR on 9/21 echo.  6. COPD: He is an active smoker.  - PFTs (12/18) suggestive of severe COPD.  7. H/o HTN 8. ? apical mural thrombus: Not seen on more recent echoes 9. CAD: 1/19 CT chest showed coronary  calcification.   Current Outpatient Medications  Medication Sig Dispense Refill   albuterol (PROVENTIL HFA;VENTOLIN HFA) 108 (90 Base) MCG/ACT inhaler Inhale 1-2 puffs into the lungs every 6 (six) hours as needed for wheezing or shortness of breath.     atorvastatin (LIPITOR) 40 MG tablet TAKE ONE TABLET BY MOUTH EVERY DAY IN THE EVENING 30 tablet 6   bisoprolol (ZEBETA) 10 MG tablet TAKE 1 TABLET (10 MG TOTAL) BY MOUTH DAILY. (MORNING) 90 tablet 3   fluticasone (FLONASE) 50 MCG/ACT nasal spray Place 2 sprays into both nostrils daily as needed for allergies or rhinitis. 9.9 mL 0   Fluticasone-Umeclidin-Vilant (TRELEGY ELLIPTA) 100-62.5-25 MCG/INH AEPB Inhale 1 puff into the lungs daily. 2 each 0   furosemide (LASIX) 20 MG tablet Take 1 tablet (20 mg total) by mouth daily. 30 tablet 11   haloperidol (HALDOL) 5 MG tablet Take 5 mg by mouth 2 (two) times daily.     hydrOXYzine (VISTARIL) 25 MG capsule Take 1 capsule (25 mg total) by mouth 2 (two) times daily as needed for anxiety.     predniSONE (DELTASONE) 20 MG tablet Take 2 tablets (40 mg total) by mouth daily with breakfast for 7 days. 14 tablet 0   aspirin (ASPIRIN LOW DOSE) 81 MG EC tablet TAKE ONE TABLET BY MOUTH ONCE DAILY (MORNING) 90 tablet 3   isosorbide-hydrALAZINE (BIDIL) 20-37.5 MG tablet TAKE 1 TABLET BY MOUTH 3 (THREE) TIMES DAILY (AM+NOON+BEDTIME) 180 tablet 6   No current facility-administered medications for this encounter.    Allergies:   Sulfa antibiotics   Social History:  The patient  reports that he has been smoking cigarettes. He started smoking about 48 years ago. He has a 70.50 pack-year smoking history. He has never used smokeless tobacco. He reports that he does not drink alcohol and does not use drugs.   Family History:  The patient's family history includes Hypertension in his father and mother.   ROS:  Please see the history of present illness.   All other systems are personally reviewed and negative.   Exam:    BP 124/70   Pulse 98   Wt 63.9 kg (140 lb 12.8 oz)   SpO2 95%   BMI 23.43 kg/m  General: NAD Neck: JVP 8-9 cm, no thyromegaly or thyroid nodule.  Lungs: Diffuse wheezing CV: Nondisplaced PMI.  Heart regular S1/S2, no S3/S4, no murmur.  1+ edema 1/3 to knees bilaterally.  No carotid bruit.  Normal pedal pulses.  Abdomen: Soft, nontender, no hepatosplenomegaly, no distention.  Skin: Intact without lesions or rashes.  Neurologic: Alert and oriented x 3.  Psych: Normal affect. Extremities: No clubbing or cyanosis.  HEENT: Normal.   Recent Labs: 03/20/2021: TSH 0.329 03/22/2021: ALT 19 03/25/2021: Hemoglobin 9.2; Platelets 125 03/26/2021: Magnesium 1.6 03/30/2021: B Natriuretic Peptide 139.0; BUN 36; Creatinine, Ser 1.66; Potassium 5.1; Sodium 142  Personally reviewed   Wt Readings from Last 3 Encounters:  03/30/21 63.9 kg (  140 lb 12.8 oz)  03/26/21 66.9 kg (147 lb 7.8 oz)  12/08/20 60.5 kg (133 lb 6.4 oz)      ASSESSMENT AND PLAN:  1. Chronic systolic => diastolic CHF: Echo 99991111 EF 15% with moderate-severe MR. Echo 08/11/17 LVEF 15-20%, no MR noted. Cardiomyopathy of uncertain etiology, he has refused cath in the past.  Echo in 7/20 showed improvement in EF to 50-55%.  Echo in 9/21 showed EF stable 55%, moderate LVH, normal RV.  PYP scan not suggestive of TTR cardiac amyloidosis.  He was recently admitted with AKI and E coli bacteremia, and losartan, spironolactone, and Lasix were stopped.  He has NYHA class IIIb symptoms with active wheezing. I think that his symptoms are likely driven by COPD, but he also appears to have mild volume overload and weight is up.   - Restart Lasix 40 mg daily x 3 days then 20 mg daily (was on 20 mg daily prior to last admission). BMET/BNP today and BMET in 10 days.  - He needs repeat echo to make sure EF remains preserved.   - I will not restart bisoprolol until he has quit wheezing.  - Leave off spironolactone and losartan with elevated creatinine.  -  Restart Bidil 1 tab tid, he was on this in the past but apparently does not still have at home.  2. Questionable apical thrombus:  Not seen on 7/20 or 9/21 echoes. He is not anticoagulated.  3. Mitral regurgitation: Moderate to severe on 7/18 echo but trivial on 7/20 and 9/21 echoes.  Suspect functional MR, improved with improved LV function.  4. COPD: He continues to smoke.  Severe COPD by prior PFTs.  Diffuse wheezing, suspect COPD exacerbation plays major role in current symptoms.  - I encouraged him to quit smoking.  - Continue prednisone, Augmentin, and nebulizers (apparently has at home).  - I will try to get him an appointment with pulmonary soon.    5. CKD stage III:  BMET today.   6. Schizophrenia: Per PCP.  7. CAD: Noted on CT chest.  Refused coronary angiography in the past.  ECG with possible old anteroseptal MI (unchanged), but echo shows EF back in normal range.  - Restart ASA 81.  - Restart atorvastatin 40 mg daily.    - As above, recommended cath in past for diagnosis of CAD but he refused.  Think reasonable to hold off now with improved EF and no chest pain.    He has trouble managing his medications.  I will have paramedicine follow him.  He will need followup with APP next week with BMET.   Signed, Loralie Champagne, MD  03/31/2021  [facility] [address] [facility] [place] [address] [phone] (office) [phone] (fax)

## 2021-04-02 ENCOUNTER — Other Ambulatory Visit: Payer: Medicare Other | Admitting: *Deleted

## 2021-04-02 DIAGNOSIS — K449 Diaphragmatic hernia without obstruction or gangrene: Secondary | ICD-10-CM | POA: Diagnosis not present

## 2021-04-02 DIAGNOSIS — I130 Hypertensive heart and chronic kidney disease with heart failure and stage 1 through stage 4 chronic kidney disease, or unspecified chronic kidney disease: Secondary | ICD-10-CM | POA: Diagnosis not present

## 2021-04-02 DIAGNOSIS — N179 Acute kidney failure, unspecified: Secondary | ICD-10-CM | POA: Diagnosis not present

## 2021-04-02 DIAGNOSIS — M1990 Unspecified osteoarthritis, unspecified site: Secondary | ICD-10-CM | POA: Diagnosis not present

## 2021-04-02 DIAGNOSIS — I272 Pulmonary hypertension, unspecified: Secondary | ICD-10-CM | POA: Diagnosis not present

## 2021-04-02 DIAGNOSIS — Z8673 Personal history of transient ischemic attack (TIA), and cerebral infarction without residual deficits: Secondary | ICD-10-CM | POA: Diagnosis not present

## 2021-04-02 DIAGNOSIS — F17210 Nicotine dependence, cigarettes, uncomplicated: Secondary | ICD-10-CM | POA: Diagnosis not present

## 2021-04-02 DIAGNOSIS — N183 Chronic kidney disease, stage 3 unspecified: Secondary | ICD-10-CM | POA: Diagnosis not present

## 2021-04-02 DIAGNOSIS — J439 Emphysema, unspecified: Secondary | ICD-10-CM | POA: Diagnosis not present

## 2021-04-02 DIAGNOSIS — J9601 Acute respiratory failure with hypoxia: Secondary | ICD-10-CM | POA: Diagnosis not present

## 2021-04-02 DIAGNOSIS — I051 Rheumatic mitral insufficiency: Secondary | ICD-10-CM | POA: Diagnosis not present

## 2021-04-02 DIAGNOSIS — J690 Pneumonitis due to inhalation of food and vomit: Secondary | ICD-10-CM | POA: Diagnosis not present

## 2021-04-02 DIAGNOSIS — Z23 Encounter for immunization: Secondary | ICD-10-CM | POA: Diagnosis not present

## 2021-04-02 DIAGNOSIS — Z9181 History of falling: Secondary | ICD-10-CM | POA: Diagnosis not present

## 2021-04-02 DIAGNOSIS — N1832 Chronic kidney disease, stage 3b: Secondary | ICD-10-CM | POA: Diagnosis not present

## 2021-04-02 DIAGNOSIS — I5042 Chronic combined systolic (congestive) and diastolic (congestive) heart failure: Secondary | ICD-10-CM | POA: Diagnosis not present

## 2021-04-02 DIAGNOSIS — I493 Ventricular premature depolarization: Secondary | ICD-10-CM | POA: Diagnosis not present

## 2021-04-02 DIAGNOSIS — Z79899 Other long term (current) drug therapy: Secondary | ICD-10-CM | POA: Diagnosis not present

## 2021-04-02 DIAGNOSIS — I700 Atherosclerosis of aorta: Secondary | ICD-10-CM | POA: Diagnosis not present

## 2021-04-02 DIAGNOSIS — Z7982 Long term (current) use of aspirin: Secondary | ICD-10-CM | POA: Diagnosis not present

## 2021-04-02 DIAGNOSIS — Z7951 Long term (current) use of inhaled steroids: Secondary | ICD-10-CM | POA: Diagnosis not present

## 2021-04-02 DIAGNOSIS — I447 Left bundle-branch block, unspecified: Secondary | ICD-10-CM | POA: Diagnosis not present

## 2021-04-02 DIAGNOSIS — E785 Hyperlipidemia, unspecified: Secondary | ICD-10-CM | POA: Diagnosis not present

## 2021-04-02 DIAGNOSIS — J449 Chronic obstructive pulmonary disease, unspecified: Secondary | ICD-10-CM | POA: Diagnosis not present

## 2021-04-02 DIAGNOSIS — Z7952 Long term (current) use of systemic steroids: Secondary | ICD-10-CM | POA: Diagnosis not present

## 2021-04-02 DIAGNOSIS — I959 Hypotension, unspecified: Secondary | ICD-10-CM | POA: Diagnosis not present

## 2021-04-02 NOTE — Patient Outreach (Signed)
[facility]) Care Management  04/02/2021  TEAK BAKA 1950/07/24 [id number]   Telephone Assessment-Re: call back  RN spoke briefly with pt today who was currently leaving the provider's office and requested a call back later today.   RN will follow up in a few hours as discussed for pending [facility] services.  Raina Mina, RN Care Management Coordinator [place] Office [phone]

## 2021-04-02 NOTE — Patient Outreach (Signed)
[facility]) Care Management  04/02/2021  Gerald Nelson 03-Jun-1951 [id number]   Telephone Assessment- Will cb in the AM.  RN returned a call to pt as requested. RN verified pt and introduced [facility] services and the purpose for the call. Pt states he had his follow up visit with [name] today and just returning home.  Pt verified his is able to obtain all his medications, has sufficient transportation to al medical appointments and a good support system in the home. RN further inquired on pt's HF and inquired if he weighs daily. Pt indicated he does not weight daily but he could. Further inquired on any symptoms of swelling to his LE. Pt indicated he did not think so but his provider informed him he has some swelling to his LE. Pt sounded SOB and RN inquired if his was having breathing issues at this time with the ongoing swelling. Pt states he was currently doing his neb treatment (COPD). RN informed pt to concentrate on completing his nebulizer treatment and RN could follow up at another time. Inquired on the best time of day to call as pt indicated tomorrow morning.   RN will follow up tomorrow morning as requested for pending [facility] services.  Raina Mina, RN Care Management Coordinator [place] Office [phone]

## 2021-04-03 ENCOUNTER — Encounter: Payer: Self-pay | Admitting: *Deleted

## 2021-04-03 ENCOUNTER — Other Ambulatory Visit: Payer: Self-pay | Admitting: *Deleted

## 2021-04-03 DIAGNOSIS — I509 Heart failure, unspecified: Secondary | ICD-10-CM

## 2021-04-03 NOTE — Patient Outreach (Signed)
[facility]) Care Management  04/03/2021  Gerald Nelson [birth year] [id number]   Telephone Assessment-Successful-Enrolled (Heart Failure)  RN spoke with pt and was able to reintroduced [facility] services and enroll pt into the HF program and services.  Pt provided all his needs that were address with Gerald Nelson, [facility] calendar to improved his organization with monitoring his daily weights (will send scales) and food insecurities (referral to care-guides for Mom's Meals due to recent discharge). Generated a plan of care for HF that was discussed in detail along with all interventions and goals. Pt reports his current weight of 139 lbs and denies any symptoms with no swelling at this time. Pt educated on what to do if swelling occurs with elevating his LE and notifying his provider Gerald Nelson with 3 lbs overnight or 5 lbs with one week of swelling with any related symptoms of distressful breathing. All inquires and questions were address with no additional needs presented at this time.   Will send the appropriate letters via [facility] and notify the provider of pt's disposition with [facility] services. Will follow up in a few weeks with ongoing care management services and verified pt has received all information provided via [facility].    Goals Addressed             This Visit's Progress    THN-Make and Keep All Appointments       Timeframe:  Short-Term Goal Priority:  Medium Start Date:    04/03/2021                         Expected End Date:  05/04/2021                     Follow Up Date 04/29/2021    - arrange a ride through an agency 1 week before appointment - ask family or friend for a ride - call to cancel if needed - keep a calendar with prescription refill dates - keep a calendar with appointment dates  Barriers: Health Behaviors    Why is this important?   Part of staying healthy is seeing the doctor for follow-up care.  If you forget your appointments, there are some things you  can do to stay on track.    Notes:  9/30-Pt verified he has sufficient transportation with hired help and SCATs when needed. Pt reported he attends his appointment with Gerald Nelson on yesterday with a few changes in his medications. No other abnormal finds at pt continue to do well with his ongoing recovery.     THN-Track and Manage Fluids and Swelling-Heart Failure       Timeframe:  Short-Term Goal Priority:  Medium Start Date:   04/03/2021                          Expected End Date:  05/04/2021                     Follow Up Date 04/29/2021    - call office if I gain more than 2 pounds in one day or 5 pounds in one week - do ankle pumps when sitting - keep legs up while sitting - track weight in diary - use salt in moderation - watch for swelling in feet, ankles and legs every day - weigh myself daily  Barriers: Health Behaviors Knowledge     Why is this important?  It is important to check your weight daily and watch how much salt and liquids you have.  It will help you to manage your heart failure.    Notes:  9/30-Pt reports no symptoms at this time based upon the education provided on HF. Will also educate on what to do if acute symptoms should occur. Verified pt is in the GREEN zone and stress the importance of early interventions to prevent hospitalization.         Gerald Mina, RN Care Management Coordinator [place] Office [phone]

## 2021-04-06 ENCOUNTER — Telehealth (HOSPITAL_COMMUNITY): Payer: Self-pay | Admitting: Licensed Clinical Social Worker

## 2021-04-06 NOTE — Progress Notes (Signed)
Heart and Vascular Care Navigation  04/06/2021  Gerald Nelson 07-29-1950 [id number]  Reason for Referral: Paramedicine enrollment   Engaged with patient by telephone for follow up visit for Heart and Vascular Care Coordination.                                                                                                   Paramedicine Initial Assessment:  Housing:  In what kind of housing do you live? House/apt/trailer/shelter? Boarding home  Do you rent/pay a mortgage/own? rent  Do you live with anyone? roommates   Insurance:  Are you currently insured? yes  Do you have prescription coverage? yes  Transportation:  Do you have transportation to your medical appointments? Friend drives hime    Daily Health Needs: Do you have a working scale at home? No but THN case management sending it out  How do you manage your medications at home? Gets pill packs from [place]  Do you have issues affording your medications? no  Do you have a PCP? [name]  Are there any additional barriers you see to getting the care you need? no                                      HRT/VAS Care Coordination     Patients Home Cardiology Office Heart Failure Clinic   Outpatient Care Team [facility] Paramedicine; Social Worker   Clinical biochemist Name: [place] 01/08/20- pt independent and no longer needing services   Social Worker Name: Tammy Sours- [facility]- [phone]   Living arrangements for the past 2 months Apartment   Lives with: Roommate   Patient Current Insurance Coverage Managed Medicare   Does Patient Have Prescription Coverage? Yes   Home Assistive Devices/Equipment None   DME Agency NA       Social History:                                                                             SDOH Screenings   Alcohol Screen: Not on file  Depression (PHQ2-9): Low Risk    PHQ-2 Score: 0  Financial Resource Strain: Not on file  Food Insecurity: Food Insecurity  Present   Worried About Charity fundraiser in the Last Year: Sometimes true   Ran Out of Food in the Last Year: Sometimes true  Housing: Not on file  Physical Activity: Not on file  Social Connections: Not on file  Stress: Not on file  Tobacco Use: High Risk   Smoking Tobacco Use: Every Day   Smokeless Tobacco Use: Never  Transportation Needs: No Transportation Needs   Lack of Transportation (Medical): No   Lack of Transportation (Non-Medical): No    SDOH Interventions:  Financial Resources:    disability  Food Insecurity:   THN case management ordered moms meals  Housing Insecurity:  No concerns expressed  Transportation:    Friend drives him to appts   Follow-up plan:    Referral sent out to paramedicine team for assignment.  Will continue to follow and assist as needed  [name], [place] [facility] Desk#: [phone] Cell#: [phone]

## 2021-04-06 NOTE — Progress Notes (Signed)
Date:  04/07/2021   ID:  Gerald Nelson, DOB 1950/10/14, MRN [id number]   Provider location: [place] [facility] Type of Visit: Established patient   PCP:  [name], MD  Cardiologist:  Dr. Aundra Nelson  Chief Complaint: Follow up for systolic heart failure.   History of Present Illness: Gerald Nelson is a 70 y.o. male who has a past medical history of chronic systolic CHF (EF 0000000 in July 2018), severe MR/TR, pulmonary HTN, CVA, CKD, schizophrenia, HTN and tobacco abuse.    Gerald Nelson was admitted to [facility] 7/2-01/06/17 for newly diagnosed acute systolic CHF. Echo showed LVEF 15%, severe global hypokinesis, moderate LVH, coarse trabeculation of the LV apex with numerous false tendinae of the left ventricle, very stagnant blood flow at the LV apex with smoke but no obvious LV thrombus - Definity contrast was given, again, noting stagnant apical blood flow - this could suggest  recent thrombus and certainly high risk for apical thrombus  formation. Other findings include aortic sclerosis with mild AI, moderate to severe MR, moderate LAE, mild RAE, moderate to severe TR, moderate to severe pulmonary hypertension (RVSP 73 mmHg), dilated IVC, trivial posterior pericardial effusion.    He was admitted again in 10/18. He diuresed 25 pounds on IV lasix. He refused cath. His clonidine and diltazem were stopped during this admission. Referred to paramedicine. Discharge weight 133 pounds.    Echo 08/11/17 LVEF 15-20%, no MR noted.   Echo in 7/20 showed improvement in EF to 50-55%, moderate LVH, normal RV size and systolic function.  Echo in 9/21 showed EF 55% with moderate LVH, RV normal, IVC normal.  PYP scan in 11/21 was not suggestive of TTR cardiac amyloidosis.   Patient was admitted in 9/22 with AKI.  He was found to have COPD exacerbation.  V/Q scan was negative.  He had E coli bacteremia and there was also concern for aspiration PNA.  Creatinine peaked at 4.6 then improved.  He had been  on Lasix 20 mg daily prior to admission, this was stopped. Losartan and spironolactone were also stopped. He was sent home on Augmentin and prednisone for COPD exacerbation.    Post hospital follow up he was not sure what medications he was on, actively wheezing and appeared mildly volume overloaded. Lasix, BiDil and atorva restarted. Gerald Nelson and losartan not restarted with elevated SCr and bisoprolol held due to active wheezing. Paramedicine referral arranged.   Today he returns for HF follow up. He brought his bubble packs today and it appears he has been taking his spiro, bisoprolol and losartan, despite not being resumed last visit. He says he sometimes get SOB with activity but unable to tell if it is worse today. Denies CP, dizziness, edema, or PND/Orthopnea. Appetite ok. No fever or chills. Taking all medications in his bubble packs. He is smoking less than 1 PPD. No ETOH.  ECG (personally reviewed): none ordered today  Labs (6/19): K 3.8, creatinine 2.2 Labs (11/19): K 4.1, creatinine 1.95, LDL 54, HDl 54 Labs (12/19): K 4.6, creatinine 1.89 LabS (2/20): K 4.5, creatinine 1.68  Labs (4/20): K 4.5, creatinine 1.58 Labs (9/20): K 5.1, creatinine 2.02 Labs (10/20): LDL 67, K 4.8, creatinine 1.92 Labs (3/21): K 4.8, creatinine 1.97 Labs (9/22): K 4.3, creatinine 1.6   PMH: 1. Chronic systolic CHF:  Cardiomyopathy of uncertain etiology, refused cath. He also refused ICD.  - Echo (7/18): Moderate LVH, severe FBSH, trabeculation at apex not meeting criteria for noncompaction,  EF 15%, moderate to severe MR, low normal RV systolic function, moderate-severe TR.  - Echo (2/19): Moderate LVH, EF 15-20%, No mitral regurgitation, normal RV size and systolic function, mild TR.  - Echo (7/20): EF 50-55%, moderate LVH, normal RV size and systolic function.  - Echo (9/21): EF 55%, moderate LVH, RV normal, IVC normal.  - PYP scan (11/21): No evidence for TTR cardiac amyloidosis.  2. H/o CVA 3.  Schizophrenia 4. CKD stage 3 5. Mitral regurgitation: Suspect functional. Moderate-severe MR on 7/18 echo, minimal MR on 9/21 echo.  6. COPD: He is an active smoker.  - PFTs (12/18) suggestive of severe COPD.  7. H/o HTN 8. ? apical mural thrombus: Not seen on more recent echoes 9. CAD: 1/19 CT chest showed coronary calcification.   Current Outpatient Medications  Medication Sig Dispense Refill   albuterol (PROVENTIL HFA;VENTOLIN HFA) 108 (90 Base) MCG/ACT inhaler Inhale 1-2 puffs into the lungs every 6 (six) hours as needed for wheezing or shortness of breath.     aspirin (ASPIRIN LOW DOSE) 81 MG EC tablet TAKE ONE TABLET BY MOUTH ONCE DAILY (MORNING) 90 tablet 3   atorvastatin (LIPITOR) 40 MG tablet TAKE ONE TABLET BY MOUTH EVERY DAY IN THE EVENING 30 tablet 6   bisoprolol (ZEBETA) 10 MG tablet TAKE 1 TABLET (10 MG TOTAL) BY MOUTH DAILY. (MORNING) 90 tablet 3   Fluticasone-Umeclidin-Vilant (TRELEGY ELLIPTA) 100-62.5-25 MCG/INH AEPB Inhale 1 puff into the lungs daily. 2 each 0   furosemide (LASIX) 20 MG tablet Take 1 tablet (20 mg total) by mouth daily. Or as directed by the heart failure clinic 30 tablet 11   haloperidol (HALDOL) 5 MG tablet Take 5 mg by mouth 2 (two) times daily.     hydrOXYzine (VISTARIL) 25 MG capsule Take 1 capsule (25 mg total) by mouth 2 (two) times daily as needed for anxiety.     isosorbide-hydrALAZINE (BIDIL) 20-37.5 MG tablet TAKE 1 TABLET BY MOUTH 3 (THREE) TIMES DAILY (AM+NOON+BEDTIME) 180 tablet 6   losartan (COZAAR) 25 MG tablet Take 25 mg by mouth 2 (two) times daily.     spironolactone (ALDACTONE) 25 MG tablet Take 25 mg by mouth at bedtime.     No current facility-administered medications for this encounter.   Allergies:   Sulfa antibiotics   Social History:  The patient  reports that he has been smoking cigarettes. He started smoking about 48 years ago. He has a 70.50 pack-year smoking history. He has never used smokeless tobacco. He reports that he  does not drink alcohol and does not use drugs.   Family History:  The patient's family history includes Hypertension in his father and mother.   ROS:  Please see the history of present illness.   All other systems are personally reviewed and negative.   Exam:   BP 97/60   Pulse 75   Wt 59.2 kg (130 lb 9.6 oz)   SpO2 97%   BMI 21.73 kg/m   General:  NAD. No resp difficulty, chronically-ill appearing, thin, unkempt HEENT: Normal Neck: Supple. No JVD. Carotids 2+ bilat; no bruits. No lymphadenopathy or thryomegaly appreciated. Cor: PMI nondisplaced. Regular rate & rhythm. No rubs, gallops, 2/6 MR Lungs: Clear, decreased in bases. Abdomen: Soft, nontender, nondistended. No hepatosplenomegaly. No bruits or masses. Good bowel sounds. Extremities: No cyanosis, clubbing, rash, edema Neuro: Alert & oriented x 3, cranial nerves grossly intact. Moves all 4 extremities w/o difficulty. Affect pleasant.  Recent Labs: 03/20/2021: TSH 0.329 03/22/2021: ALT 19  03/25/2021: Hemoglobin 9.2; Platelets 125 03/26/2021: Magnesium 1.6 03/30/2021: B Natriuretic Peptide 139.0; BUN 36; Creatinine, Ser 1.66; Potassium 5.1; Sodium 142  Personally reviewed   Wt Readings from Last 3 Encounters:  04/07/21 59.2 kg (130 lb 9.6 oz)  03/30/21 63.9 kg (140 lb 12.8 oz)  03/26/21 66.9 kg (147 lb 7.8 oz)    ASSESSMENT AND PLAN: 1. Chronic systolic => diastolic CHF: Echo 99991111 EF 15% with moderate-severe MR. Echo 08/11/17 LVEF 15-20%, no MR noted. Cardiomyopathy of uncertain etiology, he has refused cath in the past.  Echo in 7/20 showed improvement in EF to 50-55%.  Echo in 9/21 showed EF stable 55%, moderate LVH, normal RV.  PYP scan not suggestive of TTR cardiac amyloidosis.  He was recently admitted with AKI and E coli bacteremia, and losartan, spironolactone, and Lasix were stopped.  NYHA class III symptoms today, although he has difficulty today answering questions related to his functional class. He is not volume  overloaded, weight down 10 lbs, ReDs 26%. - He has been taking spiro, losartan and bisoprolol although these were held at last visit due to elevated SCr and wheezing. I will keep for now since they are in bubble packs. I anticipate stopping losartan and spiro, but will await labs. I will notify Nira Conn with paramedicine of medication changes. - Continue lasix 20 mg daily. BMET today. - Continue Bidil 1 tab tid. - Continue losartan 25 mg daily. - Continue spironolactone 25 mg daily. - Continue bisoprolol 10 mg daily. - Repeat echo scheduled for 04/24/21, to make sure EF remains preserved.   2. Questionable apical thrombus:  Not seen on 7/20 or 9/21 echoes. He is not anticoagulated.  3. Mitral regurgitation: Moderate to severe on 7/18 echo but trivial on 7/20 and 9/21 echoes.  Suspect functional MR, improved with improved LV function.  4. COPD: He continues to smoke.  Severe COPD by prior PFTs.  No wheezing on exam today. - I encouraged him to quit smoking.  - Continue Trelegy. - He has been referred to Pulmonary.  5. CKD stage III:  BMET today.   6. Schizophrenia: Per PCP.  7. CAD: Noted on CT chest.  Refused coronary angiography in the past.  ECG with possible old anteroseptal MI (unchanged), but echo shows EF back in normal range.  - Continue ASA 81.  - Continue atorvastatin 40 mg daily.    - As above, recommended cath in past for diagnosis of CAD but he refused.  Think reasonable to hold off now with improved EF and no chest pain.    He has trouble managing his medications and is now followed by Paramedicine.   Follow up in 4 weeks with APP.  Signed, Rafael Bihari, FNP  04/07/2021  [facility] [address] [facility] and [place] [address] [phone] (office) [phone] (fax)

## 2021-04-07 ENCOUNTER — Ambulatory Visit (HOSPITAL_COMMUNITY)
Admission: RE | Admit: 2021-04-07 | Discharge: 2021-04-07 | Disposition: A | Discharge status: Home / Self Care | Payer: Medicare Other | Source: Ambulatory Visit | Attending: Family Medicine | Admitting: Family Medicine

## 2021-04-07 ENCOUNTER — Encounter (HOSPITAL_COMMUNITY): Payer: Self-pay

## 2021-04-07 ENCOUNTER — Other Ambulatory Visit (HOSPITAL_COMMUNITY): Payer: Self-pay | Admitting: Family Medicine

## 2021-04-07 ENCOUNTER — Other Ambulatory Visit: Payer: Self-pay

## 2021-04-07 ENCOUNTER — Telehealth (HOSPITAL_COMMUNITY): Payer: Self-pay | Admitting: Family Medicine

## 2021-04-07 ENCOUNTER — Other Ambulatory Visit (HOSPITAL_COMMUNITY): Payer: Self-pay

## 2021-04-07 VITALS — BP 97/60 | HR 75 | Wt 130.6 lb

## 2021-04-07 DIAGNOSIS — Z7952 Long term (current) use of systemic steroids: Secondary | ICD-10-CM | POA: Diagnosis not present

## 2021-04-07 DIAGNOSIS — N183 Chronic kidney disease, stage 3 unspecified: Secondary | ICD-10-CM | POA: Insufficient documentation

## 2021-04-07 DIAGNOSIS — J449 Chronic obstructive pulmonary disease, unspecified: Secondary | ICD-10-CM | POA: Diagnosis not present

## 2021-04-07 DIAGNOSIS — Z7982 Long term (current) use of aspirin: Secondary | ICD-10-CM | POA: Diagnosis not present

## 2021-04-07 DIAGNOSIS — F17210 Nicotine dependence, cigarettes, uncomplicated: Secondary | ICD-10-CM | POA: Diagnosis not present

## 2021-04-07 DIAGNOSIS — I5022 Chronic systolic (congestive) heart failure: Secondary | ICD-10-CM

## 2021-04-07 DIAGNOSIS — J690 Pneumonitis due to inhalation of food and vomit: Secondary | ICD-10-CM | POA: Diagnosis not present

## 2021-04-07 DIAGNOSIS — I083 Combined rheumatic disorders of mitral, aortic and tricuspid valves: Secondary | ICD-10-CM | POA: Insufficient documentation

## 2021-04-07 DIAGNOSIS — I272 Pulmonary hypertension, unspecified: Secondary | ICD-10-CM | POA: Diagnosis not present

## 2021-04-07 DIAGNOSIS — Z882 Allergy status to sulfonamides status: Secondary | ICD-10-CM | POA: Insufficient documentation

## 2021-04-07 DIAGNOSIS — Z7951 Long term (current) use of inhaled steroids: Secondary | ICD-10-CM | POA: Diagnosis not present

## 2021-04-07 DIAGNOSIS — N1831 Chronic kidney disease, stage 3a: Secondary | ICD-10-CM | POA: Diagnosis not present

## 2021-04-07 DIAGNOSIS — I493 Ventricular premature depolarization: Secondary | ICD-10-CM | POA: Diagnosis not present

## 2021-04-07 DIAGNOSIS — E785 Hyperlipidemia, unspecified: Secondary | ICD-10-CM | POA: Diagnosis not present

## 2021-04-07 DIAGNOSIS — I130 Hypertensive heart and chronic kidney disease with heart failure and stage 1 through stage 4 chronic kidney disease, or unspecified chronic kidney disease: Secondary | ICD-10-CM | POA: Insufficient documentation

## 2021-04-07 DIAGNOSIS — I959 Hypotension, unspecified: Secondary | ICD-10-CM | POA: Diagnosis not present

## 2021-04-07 DIAGNOSIS — I2510 Atherosclerotic heart disease of native coronary artery without angina pectoris: Secondary | ICD-10-CM | POA: Diagnosis not present

## 2021-04-07 DIAGNOSIS — I5042 Chronic combined systolic (congestive) and diastolic (congestive) heart failure: Secondary | ICD-10-CM | POA: Diagnosis not present

## 2021-04-07 DIAGNOSIS — I340 Nonrheumatic mitral (valve) insufficiency: Secondary | ICD-10-CM | POA: Diagnosis not present

## 2021-04-07 DIAGNOSIS — M1990 Unspecified osteoarthritis, unspecified site: Secondary | ICD-10-CM | POA: Diagnosis not present

## 2021-04-07 DIAGNOSIS — Z79899 Other long term (current) drug therapy: Secondary | ICD-10-CM | POA: Insufficient documentation

## 2021-04-07 DIAGNOSIS — I447 Left bundle-branch block, unspecified: Secondary | ICD-10-CM | POA: Diagnosis not present

## 2021-04-07 DIAGNOSIS — Z8249 Family history of ischemic heart disease and other diseases of the circulatory system: Secondary | ICD-10-CM | POA: Insufficient documentation

## 2021-04-07 DIAGNOSIS — Z9181 History of falling: Secondary | ICD-10-CM | POA: Diagnosis not present

## 2021-04-07 DIAGNOSIS — N1832 Chronic kidney disease, stage 3b: Secondary | ICD-10-CM | POA: Diagnosis not present

## 2021-04-07 DIAGNOSIS — Z8673 Personal history of transient ischemic attack (TIA), and cerebral infarction without residual deficits: Secondary | ICD-10-CM | POA: Diagnosis not present

## 2021-04-07 DIAGNOSIS — J9601 Acute respiratory failure with hypoxia: Secondary | ICD-10-CM | POA: Diagnosis not present

## 2021-04-07 DIAGNOSIS — I051 Rheumatic mitral insufficiency: Secondary | ICD-10-CM | POA: Diagnosis not present

## 2021-04-07 DIAGNOSIS — N179 Acute kidney failure, unspecified: Secondary | ICD-10-CM | POA: Insufficient documentation

## 2021-04-07 DIAGNOSIS — K449 Diaphragmatic hernia without obstruction or gangrene: Secondary | ICD-10-CM | POA: Diagnosis not present

## 2021-04-07 DIAGNOSIS — J439 Emphysema, unspecified: Secondary | ICD-10-CM | POA: Diagnosis not present

## 2021-04-07 DIAGNOSIS — I700 Atherosclerosis of aorta: Secondary | ICD-10-CM | POA: Diagnosis not present

## 2021-04-07 LAB — BASIC METABOLIC PANEL
Anion gap: 8 (ref 5–15)
BUN: 32 mg/dL — ABNORMAL HIGH (ref 8–23)
CO2: 28 mmol/L (ref 22–32)
Calcium: 9 mg/dL (ref 8.9–10.3)
Chloride: 101 mmol/L (ref 98–111)
Creatinine, Ser: 1.99 mg/dL — ABNORMAL HIGH (ref 0.61–1.24)
GFR, Estimated: 36 mL/min — ABNORMAL LOW (ref >60)
Glucose, Bld: 76 mg/dL (ref 70–99)
Potassium: 5.1 mmol/L (ref 3.5–5.1)
Sodium: 137 mmol/L (ref 135–145)

## 2021-04-07 NOTE — Telephone Encounter (Signed)
Spoke to patient's landlord, Barb Merino, per patient's request today [phone]). She expressed concerns over Gerald Nelson's health. He has had recent episodes of fecal incontinence and states she has seen him grab his chest in pain. She inquired about additional home health services we may be able to offer to assist with his ADLs. I discussed the role of Paramedicine and that he is going to be followed  by [facility]. He was receiving home health services with [facility] but unclear when this stopped. She was thankful for call.  Allena Katz, FNP-BC

## 2021-04-07 NOTE — Progress Notes (Signed)
Paramedicine Encounter    Patient ID: Gerald Nelson, male    DOB: 12-02-1950, 70 y.o.   MRN: [id number]  Med changes made by NP [facility], to remove Spironolactone from bubble packs. I went by and removed same and taped bubble packs for easy access for patient to continue taking his prescribed medications in the bubble packs until new sets are delivered. I will follow up with patient in two weeks.   Landry Corporal is coming out for PT services once weekly.    ACTION: Home visit completed

## 2021-04-07 NOTE — Patient Instructions (Signed)
Routine lab work today. Will notify you of abnormal results  Follow up in 4 weeks  Follow up in  3 months with Dr.McLean  Do the following things EVERYDAY: Weigh yourself in the morning before breakfast. Write it down and keep it in a log. Take your medicines as prescribed Eat low salt foods--Limit salt (sodium) to 2000 mg per day.  Stay as active as you can everyday Limit all fluids for the day to less than 2 liters

## 2021-04-07 NOTE — Progress Notes (Signed)
ReDS Vest / Clip - 04/07/21 1200       ReDS Vest / Clip   Station Marker C    Ruler Value 25.5    ReDS Value Range Low volume    ReDS Actual Value 26

## 2021-04-08 ENCOUNTER — Telehealth: Payer: Self-pay

## 2021-04-08 NOTE — Telephone Encounter (Signed)
   Telephone encounter was:  Successful.  04/08/2021 Name: Gerald Nelson MRN: [id number] DOB: Sep 09, 1950  [name] is a 70 y.o. year old male who is a primary care patient of [name], MD . The community resource team was consulted for assistance with [place] guide performed the following interventions: Spoke with patient to confirm Eliezer Lofts from [employer] had contacted him to set-up meal delivery.  Patient confirmed that he was contacted.  Received the following email response from Eliezer Lofts at [employer] have contact this member and assisted him with setting up an order for his Mom's Meals. We need to speak with the members directly when it is not a delegated account so I just wanted to let you know that I reached him. Best, Jody".  Forwarded message to Raina Mina, RN.   Follow Up Plan:  No further follow up planned at this time. The patient has been provided with needed resources.  [name], AAS Paralegal, [facility]  [address], [place] [address] ??[email or web address]  ?? [phone]   [email or web address]

## 2021-04-09 DIAGNOSIS — J439 Emphysema, unspecified: Secondary | ICD-10-CM | POA: Diagnosis not present

## 2021-04-09 DIAGNOSIS — I130 Hypertensive heart and chronic kidney disease with heart failure and stage 1 through stage 4 chronic kidney disease, or unspecified chronic kidney disease: Secondary | ICD-10-CM | POA: Diagnosis not present

## 2021-04-09 DIAGNOSIS — I272 Pulmonary hypertension, unspecified: Secondary | ICD-10-CM | POA: Diagnosis not present

## 2021-04-09 DIAGNOSIS — N1832 Chronic kidney disease, stage 3b: Secondary | ICD-10-CM | POA: Diagnosis not present

## 2021-04-09 DIAGNOSIS — J690 Pneumonitis due to inhalation of food and vomit: Secondary | ICD-10-CM | POA: Diagnosis not present

## 2021-04-09 DIAGNOSIS — Z7982 Long term (current) use of aspirin: Secondary | ICD-10-CM | POA: Diagnosis not present

## 2021-04-09 DIAGNOSIS — Z7951 Long term (current) use of inhaled steroids: Secondary | ICD-10-CM | POA: Diagnosis not present

## 2021-04-09 DIAGNOSIS — I447 Left bundle-branch block, unspecified: Secondary | ICD-10-CM | POA: Diagnosis not present

## 2021-04-09 DIAGNOSIS — M1990 Unspecified osteoarthritis, unspecified site: Secondary | ICD-10-CM | POA: Diagnosis not present

## 2021-04-09 DIAGNOSIS — Z79899 Other long term (current) drug therapy: Secondary | ICD-10-CM | POA: Diagnosis not present

## 2021-04-09 DIAGNOSIS — I493 Ventricular premature depolarization: Secondary | ICD-10-CM | POA: Diagnosis not present

## 2021-04-09 DIAGNOSIS — I700 Atherosclerosis of aorta: Secondary | ICD-10-CM | POA: Diagnosis not present

## 2021-04-09 DIAGNOSIS — Z8673 Personal history of transient ischemic attack (TIA), and cerebral infarction without residual deficits: Secondary | ICD-10-CM | POA: Diagnosis not present

## 2021-04-09 DIAGNOSIS — Z9181 History of falling: Secondary | ICD-10-CM | POA: Diagnosis not present

## 2021-04-09 DIAGNOSIS — J9601 Acute respiratory failure with hypoxia: Secondary | ICD-10-CM | POA: Diagnosis not present

## 2021-04-09 DIAGNOSIS — F17210 Nicotine dependence, cigarettes, uncomplicated: Secondary | ICD-10-CM | POA: Diagnosis not present

## 2021-04-09 DIAGNOSIS — I051 Rheumatic mitral insufficiency: Secondary | ICD-10-CM | POA: Diagnosis not present

## 2021-04-09 DIAGNOSIS — N179 Acute kidney failure, unspecified: Secondary | ICD-10-CM | POA: Diagnosis not present

## 2021-04-09 DIAGNOSIS — K449 Diaphragmatic hernia without obstruction or gangrene: Secondary | ICD-10-CM | POA: Diagnosis not present

## 2021-04-09 DIAGNOSIS — Z7952 Long term (current) use of systemic steroids: Secondary | ICD-10-CM | POA: Diagnosis not present

## 2021-04-09 DIAGNOSIS — E785 Hyperlipidemia, unspecified: Secondary | ICD-10-CM | POA: Diagnosis not present

## 2021-04-09 DIAGNOSIS — I5042 Chronic combined systolic (congestive) and diastolic (congestive) heart failure: Secondary | ICD-10-CM | POA: Diagnosis not present

## 2021-04-09 DIAGNOSIS — I959 Hypotension, unspecified: Secondary | ICD-10-CM | POA: Diagnosis not present

## 2021-04-15 DIAGNOSIS — J439 Emphysema, unspecified: Secondary | ICD-10-CM | POA: Diagnosis not present

## 2021-04-15 DIAGNOSIS — N1832 Chronic kidney disease, stage 3b: Secondary | ICD-10-CM | POA: Diagnosis not present

## 2021-04-15 DIAGNOSIS — I447 Left bundle-branch block, unspecified: Secondary | ICD-10-CM | POA: Diagnosis not present

## 2021-04-15 DIAGNOSIS — J690 Pneumonitis due to inhalation of food and vomit: Secondary | ICD-10-CM | POA: Diagnosis not present

## 2021-04-15 DIAGNOSIS — I051 Rheumatic mitral insufficiency: Secondary | ICD-10-CM | POA: Diagnosis not present

## 2021-04-15 DIAGNOSIS — Z7982 Long term (current) use of aspirin: Secondary | ICD-10-CM | POA: Diagnosis not present

## 2021-04-15 DIAGNOSIS — I700 Atherosclerosis of aorta: Secondary | ICD-10-CM | POA: Diagnosis not present

## 2021-04-15 DIAGNOSIS — J9601 Acute respiratory failure with hypoxia: Secondary | ICD-10-CM | POA: Diagnosis not present

## 2021-04-15 DIAGNOSIS — Z79899 Other long term (current) drug therapy: Secondary | ICD-10-CM | POA: Diagnosis not present

## 2021-04-15 DIAGNOSIS — N179 Acute kidney failure, unspecified: Secondary | ICD-10-CM | POA: Diagnosis not present

## 2021-04-15 DIAGNOSIS — I272 Pulmonary hypertension, unspecified: Secondary | ICD-10-CM | POA: Diagnosis not present

## 2021-04-15 DIAGNOSIS — Z8673 Personal history of transient ischemic attack (TIA), and cerebral infarction without residual deficits: Secondary | ICD-10-CM | POA: Diagnosis not present

## 2021-04-15 DIAGNOSIS — Z7952 Long term (current) use of systemic steroids: Secondary | ICD-10-CM | POA: Diagnosis not present

## 2021-04-15 DIAGNOSIS — I959 Hypotension, unspecified: Secondary | ICD-10-CM | POA: Diagnosis not present

## 2021-04-15 DIAGNOSIS — I130 Hypertensive heart and chronic kidney disease with heart failure and stage 1 through stage 4 chronic kidney disease, or unspecified chronic kidney disease: Secondary | ICD-10-CM | POA: Diagnosis not present

## 2021-04-15 DIAGNOSIS — I5042 Chronic combined systolic (congestive) and diastolic (congestive) heart failure: Secondary | ICD-10-CM | POA: Diagnosis not present

## 2021-04-15 DIAGNOSIS — M1990 Unspecified osteoarthritis, unspecified site: Secondary | ICD-10-CM | POA: Diagnosis not present

## 2021-04-15 DIAGNOSIS — Z7951 Long term (current) use of inhaled steroids: Secondary | ICD-10-CM | POA: Diagnosis not present

## 2021-04-15 DIAGNOSIS — Z9181 History of falling: Secondary | ICD-10-CM | POA: Diagnosis not present

## 2021-04-15 DIAGNOSIS — I493 Ventricular premature depolarization: Secondary | ICD-10-CM | POA: Diagnosis not present

## 2021-04-15 DIAGNOSIS — F17210 Nicotine dependence, cigarettes, uncomplicated: Secondary | ICD-10-CM | POA: Diagnosis not present

## 2021-04-15 DIAGNOSIS — K449 Diaphragmatic hernia without obstruction or gangrene: Secondary | ICD-10-CM | POA: Diagnosis not present

## 2021-04-15 DIAGNOSIS — E785 Hyperlipidemia, unspecified: Secondary | ICD-10-CM | POA: Diagnosis not present

## 2021-04-16 DIAGNOSIS — J439 Emphysema, unspecified: Secondary | ICD-10-CM | POA: Diagnosis not present

## 2021-04-16 DIAGNOSIS — N179 Acute kidney failure, unspecified: Secondary | ICD-10-CM | POA: Diagnosis not present

## 2021-04-16 DIAGNOSIS — Z7951 Long term (current) use of inhaled steroids: Secondary | ICD-10-CM | POA: Diagnosis not present

## 2021-04-16 DIAGNOSIS — I959 Hypotension, unspecified: Secondary | ICD-10-CM | POA: Diagnosis not present

## 2021-04-16 DIAGNOSIS — J690 Pneumonitis due to inhalation of food and vomit: Secondary | ICD-10-CM | POA: Diagnosis not present

## 2021-04-16 DIAGNOSIS — Z79899 Other long term (current) drug therapy: Secondary | ICD-10-CM | POA: Diagnosis not present

## 2021-04-16 DIAGNOSIS — K449 Diaphragmatic hernia without obstruction or gangrene: Secondary | ICD-10-CM | POA: Diagnosis not present

## 2021-04-16 DIAGNOSIS — Z7982 Long term (current) use of aspirin: Secondary | ICD-10-CM | POA: Diagnosis not present

## 2021-04-16 DIAGNOSIS — N1832 Chronic kidney disease, stage 3b: Secondary | ICD-10-CM | POA: Diagnosis not present

## 2021-04-16 DIAGNOSIS — I700 Atherosclerosis of aorta: Secondary | ICD-10-CM | POA: Diagnosis not present

## 2021-04-16 DIAGNOSIS — M1990 Unspecified osteoarthritis, unspecified site: Secondary | ICD-10-CM | POA: Diagnosis not present

## 2021-04-16 DIAGNOSIS — F17210 Nicotine dependence, cigarettes, uncomplicated: Secondary | ICD-10-CM | POA: Diagnosis not present

## 2021-04-16 DIAGNOSIS — I447 Left bundle-branch block, unspecified: Secondary | ICD-10-CM | POA: Diagnosis not present

## 2021-04-16 DIAGNOSIS — I493 Ventricular premature depolarization: Secondary | ICD-10-CM | POA: Diagnosis not present

## 2021-04-16 DIAGNOSIS — J9601 Acute respiratory failure with hypoxia: Secondary | ICD-10-CM | POA: Diagnosis not present

## 2021-04-16 DIAGNOSIS — Z7952 Long term (current) use of systemic steroids: Secondary | ICD-10-CM | POA: Diagnosis not present

## 2021-04-16 DIAGNOSIS — E785 Hyperlipidemia, unspecified: Secondary | ICD-10-CM | POA: Diagnosis not present

## 2021-04-16 DIAGNOSIS — I130 Hypertensive heart and chronic kidney disease with heart failure and stage 1 through stage 4 chronic kidney disease, or unspecified chronic kidney disease: Secondary | ICD-10-CM | POA: Diagnosis not present

## 2021-04-16 DIAGNOSIS — Z8673 Personal history of transient ischemic attack (TIA), and cerebral infarction without residual deficits: Secondary | ICD-10-CM | POA: Diagnosis not present

## 2021-04-16 DIAGNOSIS — I051 Rheumatic mitral insufficiency: Secondary | ICD-10-CM | POA: Diagnosis not present

## 2021-04-16 DIAGNOSIS — Z9181 History of falling: Secondary | ICD-10-CM | POA: Diagnosis not present

## 2021-04-16 DIAGNOSIS — I272 Pulmonary hypertension, unspecified: Secondary | ICD-10-CM | POA: Diagnosis not present

## 2021-04-16 DIAGNOSIS — I5042 Chronic combined systolic (congestive) and diastolic (congestive) heart failure: Secondary | ICD-10-CM | POA: Diagnosis not present

## 2021-04-21 ENCOUNTER — Encounter (HOSPITAL_COMMUNITY): Payer: Self-pay | Admitting: Radiology

## 2021-04-21 DIAGNOSIS — Z7951 Long term (current) use of inhaled steroids: Secondary | ICD-10-CM | POA: Diagnosis not present

## 2021-04-21 DIAGNOSIS — I272 Pulmonary hypertension, unspecified: Secondary | ICD-10-CM | POA: Diagnosis not present

## 2021-04-21 DIAGNOSIS — I130 Hypertensive heart and chronic kidney disease with heart failure and stage 1 through stage 4 chronic kidney disease, or unspecified chronic kidney disease: Secondary | ICD-10-CM | POA: Diagnosis not present

## 2021-04-21 DIAGNOSIS — I493 Ventricular premature depolarization: Secondary | ICD-10-CM | POA: Diagnosis not present

## 2021-04-21 DIAGNOSIS — I447 Left bundle-branch block, unspecified: Secondary | ICD-10-CM | POA: Diagnosis not present

## 2021-04-21 DIAGNOSIS — E785 Hyperlipidemia, unspecified: Secondary | ICD-10-CM | POA: Diagnosis not present

## 2021-04-21 DIAGNOSIS — Z7952 Long term (current) use of systemic steroids: Secondary | ICD-10-CM | POA: Diagnosis not present

## 2021-04-21 DIAGNOSIS — I5042 Chronic combined systolic (congestive) and diastolic (congestive) heart failure: Secondary | ICD-10-CM | POA: Diagnosis not present

## 2021-04-21 DIAGNOSIS — M1990 Unspecified osteoarthritis, unspecified site: Secondary | ICD-10-CM | POA: Diagnosis not present

## 2021-04-21 DIAGNOSIS — Z8673 Personal history of transient ischemic attack (TIA), and cerebral infarction without residual deficits: Secondary | ICD-10-CM | POA: Diagnosis not present

## 2021-04-21 DIAGNOSIS — I700 Atherosclerosis of aorta: Secondary | ICD-10-CM | POA: Diagnosis not present

## 2021-04-21 DIAGNOSIS — Z79899 Other long term (current) drug therapy: Secondary | ICD-10-CM | POA: Diagnosis not present

## 2021-04-21 DIAGNOSIS — F17210 Nicotine dependence, cigarettes, uncomplicated: Secondary | ICD-10-CM | POA: Diagnosis not present

## 2021-04-21 DIAGNOSIS — J690 Pneumonitis due to inhalation of food and vomit: Secondary | ICD-10-CM | POA: Diagnosis not present

## 2021-04-21 DIAGNOSIS — J9601 Acute respiratory failure with hypoxia: Secondary | ICD-10-CM | POA: Diagnosis not present

## 2021-04-21 DIAGNOSIS — N179 Acute kidney failure, unspecified: Secondary | ICD-10-CM | POA: Diagnosis not present

## 2021-04-21 DIAGNOSIS — I959 Hypotension, unspecified: Secondary | ICD-10-CM | POA: Diagnosis not present

## 2021-04-21 DIAGNOSIS — K449 Diaphragmatic hernia without obstruction or gangrene: Secondary | ICD-10-CM | POA: Diagnosis not present

## 2021-04-21 DIAGNOSIS — J439 Emphysema, unspecified: Secondary | ICD-10-CM | POA: Diagnosis not present

## 2021-04-21 DIAGNOSIS — Z9181 History of falling: Secondary | ICD-10-CM | POA: Diagnosis not present

## 2021-04-21 DIAGNOSIS — Z7982 Long term (current) use of aspirin: Secondary | ICD-10-CM | POA: Diagnosis not present

## 2021-04-21 DIAGNOSIS — I051 Rheumatic mitral insufficiency: Secondary | ICD-10-CM | POA: Diagnosis not present

## 2021-04-21 DIAGNOSIS — N1832 Chronic kidney disease, stage 3b: Secondary | ICD-10-CM | POA: Diagnosis not present

## 2021-04-23 DIAGNOSIS — I700 Atherosclerosis of aorta: Secondary | ICD-10-CM | POA: Diagnosis not present

## 2021-04-23 DIAGNOSIS — Z79899 Other long term (current) drug therapy: Secondary | ICD-10-CM | POA: Diagnosis not present

## 2021-04-23 DIAGNOSIS — N1832 Chronic kidney disease, stage 3b: Secondary | ICD-10-CM | POA: Diagnosis not present

## 2021-04-23 DIAGNOSIS — Z8673 Personal history of transient ischemic attack (TIA), and cerebral infarction without residual deficits: Secondary | ICD-10-CM | POA: Diagnosis not present

## 2021-04-23 DIAGNOSIS — Z7951 Long term (current) use of inhaled steroids: Secondary | ICD-10-CM | POA: Diagnosis not present

## 2021-04-23 DIAGNOSIS — M1990 Unspecified osteoarthritis, unspecified site: Secondary | ICD-10-CM | POA: Diagnosis not present

## 2021-04-23 DIAGNOSIS — J690 Pneumonitis due to inhalation of food and vomit: Secondary | ICD-10-CM | POA: Diagnosis not present

## 2021-04-23 DIAGNOSIS — I051 Rheumatic mitral insufficiency: Secondary | ICD-10-CM | POA: Diagnosis not present

## 2021-04-23 DIAGNOSIS — I5042 Chronic combined systolic (congestive) and diastolic (congestive) heart failure: Secondary | ICD-10-CM | POA: Diagnosis not present

## 2021-04-23 DIAGNOSIS — Z7952 Long term (current) use of systemic steroids: Secondary | ICD-10-CM | POA: Diagnosis not present

## 2021-04-23 DIAGNOSIS — I447 Left bundle-branch block, unspecified: Secondary | ICD-10-CM | POA: Diagnosis not present

## 2021-04-23 DIAGNOSIS — F17210 Nicotine dependence, cigarettes, uncomplicated: Secondary | ICD-10-CM | POA: Diagnosis not present

## 2021-04-23 DIAGNOSIS — I130 Hypertensive heart and chronic kidney disease with heart failure and stage 1 through stage 4 chronic kidney disease, or unspecified chronic kidney disease: Secondary | ICD-10-CM | POA: Diagnosis not present

## 2021-04-23 DIAGNOSIS — J439 Emphysema, unspecified: Secondary | ICD-10-CM | POA: Diagnosis not present

## 2021-04-23 DIAGNOSIS — I272 Pulmonary hypertension, unspecified: Secondary | ICD-10-CM | POA: Diagnosis not present

## 2021-04-23 DIAGNOSIS — I493 Ventricular premature depolarization: Secondary | ICD-10-CM | POA: Diagnosis not present

## 2021-04-23 DIAGNOSIS — E785 Hyperlipidemia, unspecified: Secondary | ICD-10-CM | POA: Diagnosis not present

## 2021-04-23 DIAGNOSIS — K449 Diaphragmatic hernia without obstruction or gangrene: Secondary | ICD-10-CM | POA: Diagnosis not present

## 2021-04-23 DIAGNOSIS — Z9181 History of falling: Secondary | ICD-10-CM | POA: Diagnosis not present

## 2021-04-23 DIAGNOSIS — J9601 Acute respiratory failure with hypoxia: Secondary | ICD-10-CM | POA: Diagnosis not present

## 2021-04-23 DIAGNOSIS — Z7982 Long term (current) use of aspirin: Secondary | ICD-10-CM | POA: Diagnosis not present

## 2021-04-23 DIAGNOSIS — I959 Hypotension, unspecified: Secondary | ICD-10-CM | POA: Diagnosis not present

## 2021-04-23 DIAGNOSIS — N179 Acute kidney failure, unspecified: Secondary | ICD-10-CM | POA: Diagnosis not present

## 2021-04-24 ENCOUNTER — Other Ambulatory Visit: Payer: Self-pay

## 2021-04-24 ENCOUNTER — Ambulatory Visit (HOSPITAL_COMMUNITY)
Admission: RE | Admit: 2021-04-24 | Discharge: 2021-04-24 | Disposition: A | Discharge status: Home / Self Care | Payer: Medicare Other | Source: Ambulatory Visit | Attending: Internal Medicine | Admitting: Internal Medicine

## 2021-04-24 DIAGNOSIS — I272 Pulmonary hypertension, unspecified: Secondary | ICD-10-CM | POA: Insufficient documentation

## 2021-04-24 DIAGNOSIS — I358 Other nonrheumatic aortic valve disorders: Secondary | ICD-10-CM | POA: Diagnosis not present

## 2021-04-24 LAB — ECHOCARDIOGRAM COMPLETE
Area-P 1/2: 2.67 cm2
S' Lateral: 2.5 cm

## 2021-04-27 DIAGNOSIS — N2581 Secondary hyperparathyroidism of renal origin: Secondary | ICD-10-CM | POA: Diagnosis not present

## 2021-04-27 DIAGNOSIS — I129 Hypertensive chronic kidney disease with stage 1 through stage 4 chronic kidney disease, or unspecified chronic kidney disease: Secondary | ICD-10-CM | POA: Diagnosis not present

## 2021-04-27 DIAGNOSIS — N184 Chronic kidney disease, stage 4 (severe): Secondary | ICD-10-CM | POA: Diagnosis not present

## 2021-04-27 DIAGNOSIS — D631 Anemia in chronic kidney disease: Secondary | ICD-10-CM | POA: Diagnosis not present

## 2021-04-28 ENCOUNTER — Other Ambulatory Visit (HOSPITAL_COMMUNITY): Payer: Self-pay

## 2021-04-28 NOTE — Progress Notes (Signed)
Paramedicine Encounter    Patient ID: Gerald Nelson, male    DOB: October 16, 1950, 70 y.o.   MRN: [id number]   Patient Care Team: Gerald Low, MD as PCP - General (Internal Medicine) Gerald Dresser, MD as PCP - Advanced Heart Failure (Cardiology) Gerald Ny, LCSW as Social Worker (Licensed Clinical Social Worker) Gerald Bastos, RN as [place] Management  Patient Active Problem List   Diagnosis Date Noted   Constipation 03/23/2021   Aspiration pneumonia due to gastric secretions ([place])    E coli bacteremia 03/22/2021   Acute renal failure (ARF) ([place]) 03/20/2021   COPD exacerbation ([place]) 05/01/2020   Stage 3 severe COPD by GOLD classification ([place]) 11/14/2018   Noncompliance 04/23/2017   Mitral regurgitation 04/23/2017   Thrombus - possible apical thrombus 01/2017 04/23/2017   Hyperlipidemia 04/23/2017   Pulmonary hypertension (HCC)    SOB (shortness of breath)    Palliative care by specialist    Tobacco abuse 04/19/2017   Acute respiratory failure with hypoxia ([place]) 01/27/2017   Hypertensive urgency 01/27/2017   CHF (congestive heart failure) ([place]) 01/26/2017   CKD (chronic kidney disease), stage III ([place]) 01/04/2017   Essential hypertension 01/04/2017   Cerebral infarction ([place]) 12/12/2013   Hypotension 12/06/2012   Leukocytosis 12/06/2012   Hyponatremia 12/06/2012   Hiatal hernia 12/06/2012   Syncope 12/06/2012   Schizophrenia (HCC)     Current Outpatient Medications:    albuterol (PROVENTIL HFA;VENTOLIN HFA) 108 (90 Base) MCG/ACT inhaler, Inhale 1-2 puffs into the lungs every 6 (six) hours as needed for wheezing or shortness of breath., Disp: , Rfl:    aspirin (ASPIRIN Nelson DOSE) 81 MG EC tablet, TAKE ONE TABLET BY MOUTH ONCE DAILY (MORNING), Disp: 90 tablet, Rfl: 3   atorvastatin (LIPITOR) 40 MG tablet, TAKE ONE TABLET BY MOUTH EVERY DAY IN THE EVENING, Disp: 30 tablet, Rfl: 6   bisoprolol (ZEBETA) 10 MG tablet, TAKE 1 TABLET (10 MG TOTAL) BY  MOUTH DAILY. (MORNING), Disp: 90 tablet, Rfl: 3   Fluticasone-Umeclidin-Vilant (TRELEGY ELLIPTA) 100-62.5-25 MCG/INH AEPB, Inhale 1 puff into the lungs daily., Disp: 2 each, Rfl: 0   furosemide (LASIX) 20 MG tablet, Take 1 tablet (20 mg total) by mouth daily. Or as directed by the heart failure clinic, Disp: 30 tablet, Rfl: 11   haloperidol (HALDOL) 5 MG tablet, Take 5 mg by mouth 2 (two) times daily., Disp: , Rfl:    hydrOXYzine (VISTARIL) 25 MG capsule, Take 1 capsule (25 mg total) by mouth 2 (two) times daily as needed for anxiety., Disp: , Rfl:    isosorbide-hydrALAZINE (BIDIL) 20-37.5 MG tablet, TAKE 1 TABLET BY MOUTH 3 (THREE) TIMES DAILY (AM+NOON+BEDTIME), Disp: 180 tablet, Rfl: 6   losartan (COZAAR) 25 MG tablet, Take 25 mg by mouth 2 (two) times daily., Disp: , Rfl:  Allergies  Allergen Reactions   Sulfa Antibiotics Nausea Only     Social History   Socioeconomic History   Marital status: Single    Spouse name: Not on file   Number of children: Not on file   Years of education: Not on file   Highest education level: Not on file  Occupational History   Occupation: Systems analyst  Tobacco Use   Smoking status: Every Day    Packs/day: 1.50    Years: 47.00    Pack years: 70.50    Types: Cigarettes    Start date: 1974   Smokeless tobacco: Never   Tobacco comments:    requests patch  Vaping Use   Vaping Use: Never used  Substance and Sexual Activity   Alcohol use: No   Drug use: No   Sexual activity: Not Currently    Birth control/protection: None  Other Topics Concern   Not on file  Social History Narrative   Not on file   Social Determinants of Health   Financial Resource Strain: Not on file  Food Insecurity: Food Insecurity Present   Worried About [place] in the Last Year: Sometimes true   Ran Out of Food in the Last Year: Sometimes true  Transportation Needs: No Transportation Needs   Lack of Transportation (Medical): No   Lack of  Transportation (Non-Medical): No  Physical Activity: Not on file  Stress: Not on file  Social Connections: Not on file  Intimate Partner Violence: Not on file    Physical Exam Vitals reviewed.  Constitutional:      Appearance: Normal appearance. He is normal weight.  HENT:     Head: Normocephalic.     Nose: Nose normal.     Mouth/Throat:     Mouth: Mucous membranes are moist.     Pharynx: Oropharynx is clear.  Eyes:     Conjunctiva/sclera: Conjunctivae normal.     Pupils: Pupils are equal, round, and reactive to light.  Cardiovascular:     Rate and Rhythm: Normal rate and regular rhythm.     Pulses: Normal pulses.     Heart sounds: Normal heart sounds.  Pulmonary:     Effort: Pulmonary effort is normal.     Breath sounds: Wheezing present.  Abdominal:     General: Abdomen is flat.     Palpations: Abdomen is soft.  Musculoskeletal:        General: No swelling. Normal range of motion.     Cervical back: Normal range of motion.     Right lower leg: No edema.     Left lower leg: No edema.  Skin:    General: Skin is warm and dry.     Capillary Refill: Capillary refill takes less than 2 seconds.  Neurological:     General: No focal deficit present.     Mental Status: He is alert. Mental status is at baseline.  Psychiatric:        Mood and Affect: Mood normal.        Behavior: Behavior normal.   Arrived for home visit for Gerald Nelson who was alert and oriented reporting to be feeling "okay". He denied chest pain, dizziness but admitted to always feeling short of breath. No swelling or edema noted. Some mild wheezing noted in upper lobes. He reports he is using his inhalers and nebulizers. Gerald Nelson has been taking his medications via bubble packs, these were confirmed. Vitals were obtained and as noted. Gerald Nelson was reminded of upcoming appointments and transportation with [facility] transport was set up with a pick up time of 1:05 for Nov. 1st. I will meet Gerald Nelson at [facility] clinic appointment. He  agreed with plan. Home visit complete.   Social Assessment: -lives in home with roomates -rent is $600 -income monthly $1300 -has medicare, will look into medicaid status. -gets food but does not have foodstamps -transportation: using friends, bus or [facility] -gets meds delivered with [facility]  Date Time Provider [place]  04/29/2021 10:00 AM Gerald Bastos, RN [facility] None  05/05/2021  1:30 PM [facility] PA/NP [facility] None  07/02/2021 11:40 AM Gerald Dresser, MD [facility]  None     ACTION: Home visit completed

## 2021-04-29 ENCOUNTER — Other Ambulatory Visit: Payer: Self-pay | Admitting: *Deleted

## 2021-04-29 NOTE — Patient Outreach (Signed)
[facility]) Care Management  04/29/2021  Gerald Nelson 06/28/1951 [id number]   Telephone Assessment-Successful-HF  RN spoke with pt today with update on pt's ongoing management of care. Plan of care updated accordingly with noted issues/barriers and progress related to the initial enrollment. Pt verified he received the [facility] packet, scales and Mom's meals were provided. Strongly encouraged pt to weight daily and reiterated on when to call his provider with related symptoms.  Will follow up next month with pt's ongoing management of care related to his HF.   Goals Addressed             This Visit's Progress    [facility]N-Make and Keep All Appointments   On track    Timeframe:  Short-Term Goal Priority:  Medium Start Date:    04/03/2021                         Expected End Date:  06/03/2021                     Follow Up Date 05/27/2021    - arrange a ride through an agency 1 week before appointment - ask family or friend for a ride - call to cancel if needed - keep a calendar with prescription refill dates - keep a calendar with appointment dates  Barriers: Health Behaviors    Why is this important?   Part of staying healthy is seeing the doctor for follow-up care.  If you forget your appointments, there are some things you can do to stay on track.    Notes: 10/26-Pt continue to verify transportation source to all medical appointments with no missed or delayed appointments. Discussed upcoming appointments next week and source of transport. Verified pt has received the [facility] information and calendar. Encouraged pt to utilize the calendar by noting all pending appointments and daily weights related to his ongoing HF. 9/30-Pt verified he has sufficient transportation with hired help and SC[employer]s when needed. Pt reported he attends his appointment with [name] on yesterday with a few changes in his medications. No other abnormal finds at pt continue to do well with his  ongoing recovery.     THN-Mat[place]ain My Quality of Life       Timeframe:  Long-Range Goal Priority:  Low Start Date:    04/29/2022                         Expected End Date:   08/04/2021                   Follow Up Date 05/27/2021    - do one enjoyable thing every day - name a health care proxy (decision maker) - spend time with a child every day, borrow one if I have to - spend time outdoors at least 3 times a week - strengthen or fix relationships with loved ones  Barriers: Health Behaviors    Why is this important?   Having a long-term illness can be scary.  It can also be stressful for you and your caregiver.  These steps may help.    Notes:  10/26-Verified pt has supportive family and his daughter is his primary care giver. Strongly encouraged outings as discussed above along with quality time with family and friends. Will continue to encourage adherence with these goals and interventions.     THN-Track and Manage Fluids and Swelling-Heart Failure  On track    Timeframe:  Short term Priority:  Medium Start Date:   04/03/2021                          Expected End Date:  07/04/2021                     Follow Up Date 05/27/2021    - call office if I gain more than 2 pounds in one day or 5 pounds in one week - do ankle pumps when sitting - keep legs up while sitting - track weight in diary - use salt in moderation - watch for swelling in feet, ankles and legs every day - weigh myself daily  Barriers: Health Behaviors Knowledge     Why is this important?   It is important to check your weight daily and watch how much salt and liquids you have.  It will help you to manage your heart failure.    Notes:  10/25- Pt is ot on track with his daily weights and verified he has received the scales. RN strongly encouraged daily weights and to document all readings in the calendar for providers to review. 9/30-Pt reports no symptoms at this time based upon the education provided  on HF. Will also educate on what to do if acute symptoms should occur. Verified pt is in the GREEN zone and stress the importance of early interventions to prevent hospitalization.         Raina Mina, RN Care Management Coordinator [place] Office [phone]

## 2021-04-30 DIAGNOSIS — Z7982 Long term (current) use of aspirin: Secondary | ICD-10-CM | POA: Diagnosis not present

## 2021-04-30 DIAGNOSIS — F17210 Nicotine dependence, cigarettes, uncomplicated: Secondary | ICD-10-CM | POA: Diagnosis not present

## 2021-04-30 DIAGNOSIS — Z8673 Personal history of transient ischemic attack (TIA), and cerebral infarction without residual deficits: Secondary | ICD-10-CM | POA: Diagnosis not present

## 2021-04-30 DIAGNOSIS — I493 Ventricular premature depolarization: Secondary | ICD-10-CM | POA: Diagnosis not present

## 2021-04-30 DIAGNOSIS — Z79899 Other long term (current) drug therapy: Secondary | ICD-10-CM | POA: Diagnosis not present

## 2021-04-30 DIAGNOSIS — Z7951 Long term (current) use of inhaled steroids: Secondary | ICD-10-CM | POA: Diagnosis not present

## 2021-04-30 DIAGNOSIS — I130 Hypertensive heart and chronic kidney disease with heart failure and stage 1 through stage 4 chronic kidney disease, or unspecified chronic kidney disease: Secondary | ICD-10-CM | POA: Diagnosis not present

## 2021-04-30 DIAGNOSIS — E785 Hyperlipidemia, unspecified: Secondary | ICD-10-CM | POA: Diagnosis not present

## 2021-04-30 DIAGNOSIS — I5042 Chronic combined systolic (congestive) and diastolic (congestive) heart failure: Secondary | ICD-10-CM | POA: Diagnosis not present

## 2021-04-30 DIAGNOSIS — M1990 Unspecified osteoarthritis, unspecified site: Secondary | ICD-10-CM | POA: Diagnosis not present

## 2021-04-30 DIAGNOSIS — I447 Left bundle-branch block, unspecified: Secondary | ICD-10-CM | POA: Diagnosis not present

## 2021-04-30 DIAGNOSIS — N179 Acute kidney failure, unspecified: Secondary | ICD-10-CM | POA: Diagnosis not present

## 2021-04-30 DIAGNOSIS — I272 Pulmonary hypertension, unspecified: Secondary | ICD-10-CM | POA: Diagnosis not present

## 2021-04-30 DIAGNOSIS — I700 Atherosclerosis of aorta: Secondary | ICD-10-CM | POA: Diagnosis not present

## 2021-04-30 DIAGNOSIS — I959 Hypotension, unspecified: Secondary | ICD-10-CM | POA: Diagnosis not present

## 2021-04-30 DIAGNOSIS — J439 Emphysema, unspecified: Secondary | ICD-10-CM | POA: Diagnosis not present

## 2021-04-30 DIAGNOSIS — Z7952 Long term (current) use of systemic steroids: Secondary | ICD-10-CM | POA: Diagnosis not present

## 2021-04-30 DIAGNOSIS — K449 Diaphragmatic hernia without obstruction or gangrene: Secondary | ICD-10-CM | POA: Diagnosis not present

## 2021-04-30 DIAGNOSIS — I051 Rheumatic mitral insufficiency: Secondary | ICD-10-CM | POA: Diagnosis not present

## 2021-04-30 DIAGNOSIS — J9601 Acute respiratory failure with hypoxia: Secondary | ICD-10-CM | POA: Diagnosis not present

## 2021-04-30 DIAGNOSIS — Z9181 History of falling: Secondary | ICD-10-CM | POA: Diagnosis not present

## 2021-04-30 DIAGNOSIS — N1832 Chronic kidney disease, stage 3b: Secondary | ICD-10-CM | POA: Diagnosis not present

## 2021-04-30 DIAGNOSIS — J690 Pneumonitis due to inhalation of food and vomit: Secondary | ICD-10-CM | POA: Diagnosis not present

## 2021-05-04 NOTE — Progress Notes (Signed)
Date:  05/05/2021   ID:  Gerald Nelson, DOB 1951-04-21, MRN [id number]   Provider location:  Advanced Heart Failure Type of Visit: Established patient   PCP:  [name], MD  Cardiologist:  Dr. Aundra Dubin  Chief Complaint: Shortness of breath   History of Present Illness: Gerald Nelson is a 70 y.o. male who has a past medical history of chronic systolic CHF (EF 66% in July 2018), severe MR/TR, pulmonary HTN, CVA, CKD, schizophrenia, HTN and tobacco abuse.    Mr. Moris was admitted to [facility] 7/2-01/06/17 for newly diagnosed acute systolic CHF. Echo showed LVEF 15%, severe global hypokinesis, moderate LVH, coarse trabeculation of the LV apex with numerous false tendinae of the left ventricle, very stagnant blood flow at the LV apex with smoke but no obvious LV thrombus - Definity contrast was given, again, noting stagnant apical blood flow - this could suggest  recent thrombus and certainly high risk for apical thrombus  formation. Other findings include aortic sclerosis with mild AI, moderate to severe MR, moderate LAE, mild RAE, moderate to severe TR, moderate to severe pulmonary hypertension (RVSP 73 mmHg), dilated IVC, trivial posterior pericardial effusion.    He was admitted again in 10/18. He diuresed 25 pounds on IV lasix. He refused cath. His clonidine and diltazem were stopped during this admission. Referred to paramedicine. Discharge weight 133 pounds.    Echo 08/11/17 LVEF 15-20%, no MR noted.   Echo in 7/20 showed improvement in EF to 50-55%, moderate LVH, normal RV size and systolic function.  Echo in 9/21 showed EF 55% with moderate LVH, RV normal, IVC normal.  PYP scan in 11/21 was not suggestive of TTR cardiac amyloidosis.   Patient was admitted in 9/22 with AKI.  He was found to have COPD exacerbation.  V/Q scan was negative.  He had E coli bacteremia and there was also concern for aspiration PNA.  Creatinine peaked at 4.6 then improved.  He had been on Lasix 20 mg  daily prior to admission, this was stopped. Losartan and spironolactone were also stopped. He was sent home on Augmentin and prednisone for COPD exacerbation.   She was seen in the HF clinic 04/07/21. Multiple medication errors were reported. He was taking medications out of bottles without any e has been taking medications from the bottle. Referred  HF Paramedicine.   Today he returns for HF follow up.Overall feeling fine. SOB with exertion. Denies PND/Orthopnea. Appetite ok. No fever or chills. Weight at home has been stable. Taking all medications from a bubble pack. Smokes constantly at home. He lives in [place]. Followed by HF Paramedicine. Followed by [facility]. He has [employer]. Needs help with transportation. Says he has a degree in [employer] but it just never worked out.     Labs (6/19): K 3.8, creatinine 2.2 Labs (11/19): K 4.1, creatinine 1.95, LDL 54, HDl 54 Labs (12/19): K 4.6, creatinine 1.89 LabS (2/20): K 4.5, creatinine 1.68  Labs (4/20): K 4.5, creatinine 1.58 Labs (9/20): K 5.1, creatinine 2.02 Labs (10/20): LDL 67, K 4.8, creatinine 1.92 Labs (3/21): K 4.8, creatinine 1.97 Labs (9/22): K 4.3, creatinine 1.6 Labs ( 04/07/21): K 5.1 Creatinine 2   PMH: 1. Chronic systolic CHF:  Cardiomyopathy of uncertain etiology, refused cath. He also refused ICD.  - Echo (7/18): Moderate LVH, severe FBSH, trabeculation at apex not meeting criteria for noncompaction, EF 15%, moderate to severe MR, low normal RV systolic function, moderate-severe TR.  -  Echo (2/19): Moderate LVH, EF 15-20%, No mitral regurgitation, normal RV size and systolic function, mild TR.  - Echo (7/20): EF 50-55%, moderate LVH, normal RV size and systolic function.  - Echo (9/21): EF 55%, moderate LVH, RV normal, IVC normal.  - PYP scan (11/21): No evidence for TTR cardiac amyloidosis.  2. H/o CVA 3. Schizophrenia 4. CKD stage 3 5. Mitral regurgitation: Suspect functional. Moderate-severe MR on 7/18  echo, minimal MR on 9/21 echo.  6. COPD: He is an active smoker.  - PFTs (12/18) suggestive of severe COPD.  7. H/o HTN 8. ? apical mural thrombus: Not seen on more recent echoes 9. CAD: 1/19 CT chest showed coronary calcification.   Current Outpatient Medications  Medication Sig Dispense Refill   albuterol (PROVENTIL HFA;VENTOLIN HFA) 108 (90 Base) MCG/ACT inhaler Inhale 1-2 puffs into the lungs every 6 (six) hours as needed for wheezing or shortness of breath.     aspirin (ASPIRIN LOW DOSE) 81 MG EC tablet TAKE ONE TABLET BY MOUTH ONCE DAILY (MORNING) 90 tablet 3   atorvastatin (LIPITOR) 40 MG tablet TAKE ONE TABLET BY MOUTH EVERY DAY IN THE EVENING 30 tablet 6   bisoprolol (ZEBETA) 10 MG tablet TAKE 1 TABLET (10 MG TOTAL) BY MOUTH DAILY. (MORNING) 90 tablet 3   Fluticasone-Umeclidin-Vilant (TRELEGY ELLIPTA) 100-62.5-25 MCG/INH AEPB Inhale 1 puff into the lungs daily. 2 each 0   furosemide (LASIX) 20 MG tablet Take 1 tablet (20 mg total) by mouth daily. Or as directed by the heart failure clinic 30 tablet 11   haloperidol (HALDOL) 5 MG tablet Take 5 mg by mouth 2 (two) times daily.     hydrOXYzine (ATARAX/VISTARIL) 25 MG tablet Patient takes 1 capsule in the morning and at noon and he also takes 2 capsules at bedtime.     isosorbide-hydrALAZINE (BIDIL) 20-37.5 MG tablet TAKE 1 TABLET BY MOUTH 3 (THREE) TIMES DAILY (AM+NOON+BEDTIME) 180 tablet 6   losartan (COZAAR) 25 MG tablet Take 25 mg by mouth 2 (two) times daily.     No current facility-administered medications for this encounter.    Allergies:   Sulfa antibiotics   Social History:  The patient  reports that he has been smoking cigarettes. He started smoking about 48 years ago. He has a 70.50 pack-year smoking history. He has never used smokeless tobacco. He reports that he does not drink alcohol and does not use drugs.   Family History:  The patient's family history includes Hypertension in his father and mother.   ROS:  Please  see the history of present illness.   All other systems are personally reviewed and negative.   Exam:   BP 90/68   Pulse 79   Wt 58.8 kg (129 lb 9.6 oz)   SpO2 96%   BMI 21.57 kg/m  General:  Walked in the clinic. No resp difficulty HEENT: normal Neck: supple. no JVD. Carotids 2+ bilat; no bruits. No lymphadenopathy or thryomegaly appreciated. Cor: PMI nondisplaced. Regular rate & rhythm. No rubs, gallops or murmurs. Lungs: Rhonchi throughout. On room air.  Abdomen: soft, nontender, distended. No hepatosplenomegaly. No bruits or masses. Good bowel sounds. Extremities: no cyanosis, clubbing, rash, edema Neuro: alert & orientedx3, cranial nerves grossly intact. moves all 4 extremities w/o difficulty. Affect pleasant  Recent Labs: 03/20/2021: TSH 0.329 03/22/2021: ALT 19 03/25/2021: Hemoglobin 9.2; Platelets 125 03/26/2021: Magnesium 1.6 03/30/2021: B Natriuretic Peptide 139.0 04/07/2021: BUN 32; Creatinine, Ser 1.99; Potassium 5.1; Sodium 137  Personally reviewed   Wt Readings  from Last 3 Encounters:  05/05/21 58.8 kg (129 lb 9.6 oz)  04/28/21 57.2 kg (126 lb)  04/07/21 59.2 kg (130 lb 9.6 oz)      ASSESSMENT AND PLAN:  1. Chronic systolic => diastolic CHF: Echo 07/3084 EF 15% with moderate-severe MR. Echo 08/11/17 LVEF 15-20%, no MR noted. Cardiomyopathy of uncertain etiology, he has refused cath in the past.  Echo in 7/20 showed improvement in EF to 50-55%.  Echo in 9/21 showed EF stable 55%, moderate LVH, normal RV.  PYP scan not suggestive of TTR cardiac amyloidosis.  He was recently admitted with AKI and E coli bacteremia, and losartan, spironolactone, and L - NYHA II. Volume status stable. Continue lasix 20 mg daily.  - Continue bisoprolol 10 mg daily. -Stop losartan with elevated creatinine and hypotension. K also running high.  - Keep off spironolactone for now.  - Continue bidil  Bidil 1 tab tid, 2. Questionable apical thrombus:  Not seen on 7/20 or 9/21 echoes. He is not  anticoagulated.  3. Mitral regurgitation: Moderate to severe on 7/18 echo but trivial on 7/20 and 9/21 echoes.  Suspect functional MR, improved with improved LV function.  4. COPD: He continues to smoke.  Severe COPD by prior PFTs.   5. CKD stage III:  Check BMET  6. Schizophrenia: Per PCP.  7. CAD: Noted on CT chest.  Refused coronary angiography in the past.  ECG with possible old anteroseptal MI (unchanged), but echo shows EF back in normal range.  - Continue  ASA 81.  - Restart atorvastatin 40 mg daily.    - As above, recommended cath in past for diagnosis of CAD but he refused.    Discussed medication changes with HF Paramedic in the room. Bubble packs were adjusted during the visit.    Follow up in 3 weeks. Check BMET    Signed, Darrick Grinder, NP  05/05/2021  [facility] [address] [facility] [place] [address] [phone] (office) [phone] (fax)

## 2021-05-05 ENCOUNTER — Encounter (HOSPITAL_COMMUNITY): Payer: Self-pay

## 2021-05-05 ENCOUNTER — Other Ambulatory Visit (HOSPITAL_COMMUNITY): Payer: Self-pay | Admitting: Surgery

## 2021-05-05 ENCOUNTER — Ambulatory Visit (HOSPITAL_COMMUNITY)
Admission: RE | Admit: 2021-05-05 | Discharge: 2021-05-05 | Disposition: A | Discharge status: Home / Self Care | Payer: Medicare Other | Source: Ambulatory Visit | Attending: Adult Health | Admitting: Adult Health

## 2021-05-05 ENCOUNTER — Other Ambulatory Visit: Payer: Self-pay

## 2021-05-05 ENCOUNTER — Other Ambulatory Visit (HOSPITAL_COMMUNITY): Payer: Self-pay

## 2021-05-05 VITALS — BP 90/68 | HR 79 | Wt 129.6 lb

## 2021-05-05 DIAGNOSIS — Z8249 Family history of ischemic heart disease and other diseases of the circulatory system: Secondary | ICD-10-CM | POA: Insufficient documentation

## 2021-05-05 DIAGNOSIS — I5042 Chronic combined systolic (congestive) and diastolic (congestive) heart failure: Secondary | ICD-10-CM | POA: Insufficient documentation

## 2021-05-05 DIAGNOSIS — Z720 Tobacco use: Secondary | ICD-10-CM

## 2021-05-05 DIAGNOSIS — Z7982 Long term (current) use of aspirin: Secondary | ICD-10-CM | POA: Diagnosis not present

## 2021-05-05 DIAGNOSIS — F17210 Nicotine dependence, cigarettes, uncomplicated: Secondary | ICD-10-CM | POA: Diagnosis not present

## 2021-05-05 DIAGNOSIS — Z8673 Personal history of transient ischemic attack (TIA), and cerebral infarction without residual deficits: Secondary | ICD-10-CM | POA: Diagnosis not present

## 2021-05-05 DIAGNOSIS — F209 Schizophrenia, unspecified: Secondary | ICD-10-CM | POA: Diagnosis not present

## 2021-05-05 DIAGNOSIS — Z7951 Long term (current) use of inhaled steroids: Secondary | ICD-10-CM | POA: Insufficient documentation

## 2021-05-05 DIAGNOSIS — J449 Chronic obstructive pulmonary disease, unspecified: Secondary | ICD-10-CM | POA: Diagnosis not present

## 2021-05-05 DIAGNOSIS — I130 Hypertensive heart and chronic kidney disease with heart failure and stage 1 through stage 4 chronic kidney disease, or unspecified chronic kidney disease: Secondary | ICD-10-CM | POA: Insufficient documentation

## 2021-05-05 DIAGNOSIS — Z79899 Other long term (current) drug therapy: Secondary | ICD-10-CM | POA: Diagnosis not present

## 2021-05-05 DIAGNOSIS — N1831 Chronic kidney disease, stage 3a: Secondary | ICD-10-CM | POA: Diagnosis not present

## 2021-05-05 DIAGNOSIS — Z882 Allergy status to sulfonamides status: Secondary | ICD-10-CM | POA: Diagnosis not present

## 2021-05-05 DIAGNOSIS — I081 Rheumatic disorders of both mitral and tricuspid valves: Secondary | ICD-10-CM | POA: Insufficient documentation

## 2021-05-05 DIAGNOSIS — N183 Chronic kidney disease, stage 3 unspecified: Secondary | ICD-10-CM | POA: Diagnosis not present

## 2021-05-05 DIAGNOSIS — I272 Pulmonary hypertension, unspecified: Secondary | ICD-10-CM | POA: Insufficient documentation

## 2021-05-05 DIAGNOSIS — I5022 Chronic systolic (congestive) heart failure: Secondary | ICD-10-CM

## 2021-05-05 DIAGNOSIS — I2510 Atherosclerotic heart disease of native coronary artery without angina pectoris: Secondary | ICD-10-CM | POA: Diagnosis not present

## 2021-05-05 LAB — BASIC METABOLIC PANEL
Anion gap: 6 (ref 5–15)
BUN: 38 mg/dL — ABNORMAL HIGH (ref 8–23)
CO2: 26 mmol/L (ref 22–32)
Calcium: 9.2 mg/dL (ref 8.9–10.3)
Chloride: 102 mmol/L (ref 98–111)
Creatinine, Ser: 2.32 mg/dL — ABNORMAL HIGH (ref 0.61–1.24)
GFR, Estimated: 30 mL/min — ABNORMAL LOW (ref >60)
Glucose, Bld: 99 mg/dL (ref 70–99)
Potassium: 4.6 mmol/L (ref 3.5–5.1)
Sodium: 134 mmol/L — ABNORMAL LOW (ref 135–145)

## 2021-05-05 NOTE — Progress Notes (Signed)
Paramedicine Encounter    Patient ID: Gerald Nelson, male    DOB: December 14, 1950, 70 y.o.   MRN: [id number]  Met with Gerald Nelson in clinic today where he had no complaints. Medications were reviewed.   LOSARTAN WAS STOPPED   -I removed same from bubble packs and contacted [facility] to ensure future bubble packs were updated and correct. Pharmacist understood.   Repeat labs scheduled for Nov. 15th. I will see patient in the home in one week.      ACTION: Home visit completed

## 2021-05-05 NOTE — Patient Instructions (Signed)
STOP Losartan  Labs today We will only contact you if something comes back abnormal or we need to make some changes. Otherwise no news is good news!  Your physician recommends that you schedule a follow-up appointment in: 3-4 weeks  in the Advanced Practitioners (PA/NP) Clinic    Do the following things EVERYDAY: Weigh yourself in the morning before breakfast. Write it down and keep it in a log. Take your medicines as prescribed Eat low salt foods--Limit salt (sodium) to 2000 mg per day.  Stay as active as you can everyday Limit all fluids for the day to less than 2 liters  At the [facility], you and your health needs are our priority. As part of our continuing mission to provide you with exceptional heart care, we have created designated Provider Care Teams. These Care Teams include your primary Cardiologist (physician) and Advanced Practice Providers (APPs- Physician Assistants and Nurse Practitioners) who all work together to provide you with the care you need, when you need it.   You may see any of the following providers on your designated Care Team at your next follow up: Dr Glori Bickers Dr Haynes Kerns, NP Lyda Jester, [place] [facility] [place], [place] Audry Riles, PharmD   Please be sure to bring in all your medications bottles to every appointment.   If you have any questions or concerns before your next appointment please send [place] a message through [place] or call our office at [phone].    TO LEAVE A MESSAGE FOR THE NURSE SELECT OPTION 2, PLEASE LEAVE A MESSAGE INCLUDING: YOUR NAME DATE OF BIRTH CALL BACK NUMBER REASON FOR CALL**this is important as we prioritize the call backs  YOU WILL RECEIVE A CALL BACK THE SAME DAY AS LONG AS YOU CALL BEFORE 4:00 PM

## 2021-05-14 ENCOUNTER — Institutional Professional Consult (permissible substitution): Payer: Medicare Other | Admitting: Pulmonary Disease

## 2021-05-19 ENCOUNTER — Telehealth (HOSPITAL_COMMUNITY): Payer: Self-pay

## 2021-05-19 ENCOUNTER — Other Ambulatory Visit (HOSPITAL_COMMUNITY): Payer: Medicare Other

## 2021-05-19 ENCOUNTER — Other Ambulatory Visit (HOSPITAL_COMMUNITY): Payer: Self-pay

## 2021-05-19 NOTE — Telephone Encounter (Signed)
Spoke to [name] who reports he missed his transportation for labs today. I spoke to HF clinic who rescheduled this for Thursday at 1:45. I will make Gerald Nelson aware. I will go by today for home visit and write out appointments and assist with transportation.

## 2021-05-19 NOTE — Progress Notes (Signed)
Paramedicine Encounter    Patient ID: Gerald Nelson, male    DOB: [birth year], 70 y.o.   MRN: [id number]   Patient Care Team: Gerald Low, MD as PCP - General (Internal Medicine) Gerald Dresser, MD as PCP - Advanced Heart Failure (Cardiology) Gerald Ny, LCSW as Social Worker (Licensed Clinical Social Worker) Gerald Bastos, RN as [place] Management  Patient Active Problem List   Diagnosis Date Noted   Constipation 03/23/2021   Aspiration pneumonia due to gastric secretions ([place])    E coli bacteremia 03/22/2021   Acute renal failure (ARF) ([place]) 03/20/2021   COPD exacerbation ([place]) 05/01/2020   Stage 3 severe COPD by GOLD classification ([place]) 11/14/2018   Noncompliance 04/23/2017   Mitral regurgitation 04/23/2017   Thrombus - possible apical thrombus 01/2017 04/23/2017   Hyperlipidemia 04/23/2017   Pulmonary hypertension (HCC)    SOB (shortness of breath)    Palliative care by specialist    Tobacco abuse 04/19/2017   Acute respiratory failure with hypoxia ([place]) 01/27/2017   Hypertensive urgency 01/27/2017   CHF (congestive heart failure) ([place]) 01/26/2017   CKD (chronic kidney disease), stage III ([place]) 01/04/2017   Essential hypertension 01/04/2017   Cerebral infarction ([place]) 12/12/2013   Hypotension 12/06/2012   Leukocytosis 12/06/2012   Hyponatremia 12/06/2012   Hiatal hernia 12/06/2012   Syncope 12/06/2012   Schizophrenia (HCC)     Current Outpatient Medications:    albuterol (PROVENTIL HFA;VENTOLIN HFA) 108 (90 Base) MCG/ACT inhaler, Inhale 1-2 puffs into the lungs every 6 (six) hours as needed for wheezing or shortness of breath., Disp: , Rfl:    aspirin (ASPIRIN Nelson DOSE) 81 MG EC tablet, TAKE ONE TABLET BY MOUTH ONCE DAILY (MORNING), Disp: 90 tablet, Rfl: 3   atorvastatin (LIPITOR) 40 MG tablet, TAKE ONE TABLET BY MOUTH EVERY DAY IN THE EVENING, Disp: 30 tablet, Rfl: 6   bisoprolol (ZEBETA) 10 MG tablet, TAKE 1 TABLET (10 MG TOTAL) BY  MOUTH DAILY. (MORNING), Disp: 90 tablet, Rfl: 3   Fluticasone-Umeclidin-Vilant (TRELEGY ELLIPTA) 100-62.5-25 MCG/INH AEPB, Inhale 1 puff into the lungs daily., Disp: 2 each, Rfl: 0   furosemide (LASIX) 20 MG tablet, Take 1 tablet (20 mg total) by mouth daily. Or as directed by the heart failure clinic, Disp: 30 tablet, Rfl: 11   haloperidol (HALDOL) 5 MG tablet, Take 5 mg by mouth 2 (two) times daily., Disp: , Rfl:    hydrOXYzine (ATARAX/VISTARIL) 25 MG tablet, Patient takes 1 capsule in the morning and at noon and he also takes 2 capsules at bedtime., Disp: , Rfl:    isosorbide-hydrALAZINE (BIDIL) 20-37.5 MG tablet, TAKE 1 TABLET BY MOUTH 3 (THREE) TIMES DAILY (AM+NOON+BEDTIME), Disp: 180 tablet, Rfl: 6 Allergies  Allergen Reactions   Sulfa Antibiotics Nausea Only     Social History   Socioeconomic History   Marital status: Single    Spouse name: Not on file   Number of children: Not on file   Years of education: Not on file   Highest education level: Not on file  Occupational History   Occupation: Systems analyst  Tobacco Use   Smoking status: Every Day    Packs/day: 1.50    Years: 47.00    Pack years: 70.50    Types: Cigarettes    Start date: 1974   Smokeless tobacco: Never   Tobacco comments:    requests patch  Vaping Use   Vaping Use: Never used  Substance and Sexual Activity   Alcohol use:  No   Drug use: No   Sexual activity: Not Currently    Birth control/protection: None  Other Topics Concern   Not on file  Social History Narrative   Not on file   Social Determinants of Health   Financial Resource Strain: Not on file  Food Insecurity: Food Insecurity Present   Worried About [place] in the Last Year: Sometimes true   Ran Out of Food in the Last Year: Sometimes true  Transportation Needs: No Transportation Needs   Lack of Transportation (Medical): No   Lack of Transportation (Non-Medical): No  Physical Activity: Not on file  Stress: Not on file   Social Connections: Not on file  Intimate Partner Violence: Not on file    Physical Exam Vitals reviewed.  Constitutional:      Appearance: Normal appearance. He is normal weight.  HENT:     Head: Normocephalic.     Nose: Nose normal.     Mouth/Throat:     Mouth: Mucous membranes are moist.     Pharynx: Oropharynx is clear.  Eyes:     Conjunctiva/sclera: Conjunctivae normal.     Pupils: Pupils are equal, round, and reactive to light.  Cardiovascular:     Rate and Rhythm: Normal rate and regular rhythm.     Pulses: Normal pulses.     Heart sounds: Normal heart sounds.  Pulmonary:     Effort: Pulmonary effort is normal.     Breath sounds: Wheezing present.  Abdominal:     General: Abdomen is flat.     Palpations: Abdomen is soft.  Musculoskeletal:        General: No swelling. Normal range of motion.     Cervical back: Normal range of motion.     Right lower leg: No edema.     Left lower leg: No edema.  Skin:    General: Skin is warm and dry.     Capillary Refill: Capillary refill takes less than 2 seconds.  Neurological:     General: No focal deficit present.     Mental Status: He is alert. Mental status is at baseline.  Psychiatric:        Mood and Affect: Mood normal.    Arrived for home visit for [name] who reports feeling "fine". He denied increased shortness of breath, dizziness, chest pain or trouble taking his medications. I confirmed bubble packs which are correct. I obtained vitals and assessment. Lung sounds noted some lower lobe wheezing, I reminded him to be using inhalers and nebulizer treatments. No swelling noted. Vitals stable. We reviewed appointments. He confirmed he has transportation for these by his roommate. I gave him number for [facility] transport just incase. Gerald Nelson was grateful. Home visit complete. I will see him in two weeks.      Future Appointments  Date Time Provider [place]  05/20/2021  2:30 PM Gerald Geralds, MD [facility] None   05/21/2021  1:45 PM [facility] LAB [facility] None  06/05/2021  2:00 PM [facility] PA/NP [facility] None  07/02/2021 11:40 AM Gerald Dresser, MD [facility] None     ACTION: Home visit completed

## 2021-05-20 ENCOUNTER — Institutional Professional Consult (permissible substitution): Payer: Medicare Other | Admitting: Internal Medicine

## 2021-05-21 ENCOUNTER — Other Ambulatory Visit (HOSPITAL_COMMUNITY): Payer: Medicare Other

## 2021-05-27 ENCOUNTER — Other Ambulatory Visit: Payer: Self-pay | Admitting: *Deleted

## 2021-05-27 ENCOUNTER — Other Ambulatory Visit (HOSPITAL_COMMUNITY): Payer: Medicare Other

## 2021-05-27 NOTE — Patient Outreach (Signed)
[facility]) Care Management  05/27/2021  Gerald Nelson 1950/07/26 [id number]  Case Closure-Unsuccessful Outreach  RN attempted outreach call today however unsuccessful to inform pt of the involvement of the external case management program with her provider's office. RN able to leave a HIPAA approved voice message.   Will close case due to an external case management program will be involved with this pt from his provider's office.   Raina Mina, RN Care Management Coordinator [place] Office [phone]

## 2021-06-02 ENCOUNTER — Telehealth (HOSPITAL_COMMUNITY): Payer: Self-pay

## 2021-06-02 ENCOUNTER — Telehealth (HOSPITAL_COMMUNITY): Payer: Self-pay | Admitting: Licensed Clinical Social Worker

## 2021-06-02 ENCOUNTER — Ambulatory Visit (INDEPENDENT_AMBULATORY_CARE_PROVIDER_SITE_OTHER): Payer: Medicare Other | Admitting: Student

## 2021-06-02 ENCOUNTER — Encounter: Payer: Self-pay | Admitting: Student

## 2021-06-02 ENCOUNTER — Other Ambulatory Visit: Payer: Self-pay

## 2021-06-02 ENCOUNTER — Telehealth (HOSPITAL_COMMUNITY): Payer: Medicare Other

## 2021-06-02 VITALS — BP 90/60 | HR 78 | Temp 97.7°F | Ht 70.0 in | Wt 127.4 lb

## 2021-06-02 DIAGNOSIS — F17210 Nicotine dependence, cigarettes, uncomplicated: Secondary | ICD-10-CM

## 2021-06-02 DIAGNOSIS — J449 Chronic obstructive pulmonary disease, unspecified: Secondary | ICD-10-CM

## 2021-06-02 MED ORDER — NICOTINE 21 MG/24HR TD PT24
21.0000 mg | MEDICATED_PATCH | Freq: Every day | TRANSDERMAL | 1 refills | Status: DC
Start: 2021-06-02 — End: 2021-09-03

## 2021-06-02 MED ORDER — TRELEGY ELLIPTA 200-62.5-25 MCG/ACT IN AEPB: 1.0000 | INHALATION_SPRAY | Freq: Every day | RESPIRATORY_TRACT | 11 refills | Status: DC

## 2021-06-02 NOTE — Telephone Encounter (Signed)
Sayed called back shortly after initial call reporting his friend could not take him, I reached out to [employer] for ride set up. Call complete.

## 2021-06-02 NOTE — Progress Notes (Signed)
Synopsis: Referred for pulmonary hypertension by [name], MD  Subjective:   PATIENT ID: Gerald Nelson GENDER: male DOB: 04-Sep-1950, MRN: [id number]  Chief Complaint  Patient presents with   Consult    Pt states SOB    58yM with history of CHF, COPD, CKD, smoking 1ppd active  He says he has DOE to 6 blocks. Is gradually worsening. He does have a bit of a productive cough. No fever. He says he is taking trelegy 1 puff once daily.   He has needed several courses of steroids this year for COPD.     Otherwise pertinent review of systems is negative.  Past Medical History:  Diagnosis Date   Apical mural thrombus    a. question of apical thrombus on echo 01/2017, pt left AMA, not felt to be anticoag candidate with noncompliance.   Arthritis    CHF (congestive heart failure) (HCC)    Chronic systolic CHF (congestive heart failure) ([place])    a. pt refused cath. EF 15% 01/2017.   CKD (chronic kidney disease), stage III (HCC)    COPD (chronic obstructive pulmonary disease) (HCC)    Hernia, hiatal    Hyperlipidemia    Hypertension    Hyponatremia    Mitral regurgitation    a. mod-severe by echo 01/2017.   Pulmonary hypertension (HCC)    Schizophrenia ([place])    Tobacco abuse      Family History  Problem Relation Age of Onset   Hypertension Mother    Hypertension Father      Past Surgical History:  Procedure Laterality Date   PENILE PROSTHESIS IMPLANT      Social History   Socioeconomic History   Marital status: Single    Spouse name: Not on file   Number of children: Not on file   Years of education: Not on file   Highest education level: Not on file  Occupational History   Occupation: Systems analyst  Tobacco Use   Smoking status: Every Day    Packs/day: 1.00    Years: 47.00    Pack years: 47.00    Types: Cigarettes    Start date: 1974   Smokeless tobacco: Never   Tobacco comments:    requests patch         Pt smoke 1 pack a day//06/02/21  Vaping  Use   Vaping Use: Never used  Substance and Sexual Activity   Alcohol use: No   Drug use: No   Sexual activity: Not Currently    Birth control/protection: None  Other Topics Concern   Not on file  Social History Narrative   Not on file   Social Determinants of Health   Financial Resource Strain: Not on file  Food Insecurity: Food Insecurity Present   Worried About [place] in the Last Year: Sometimes true   Ran Out of Food in the Last Year: Sometimes true  Transportation Needs: No Transportation Needs   Lack of Transportation (Medical): No   Lack of Transportation (Non-Medical): No  Physical Activity: Not on file  Stress: Not on file  Social Connections: Not on file  Intimate Partner Violence: Not on file     Allergies  Allergen Reactions   Sulfa Antibiotics Nausea Only     Outpatient Medications Prior to Visit  Medication Sig Dispense Refill   albuterol (PROVENTIL HFA;VENTOLIN HFA) 108 (90 Base) MCG/ACT inhaler Inhale 1-2 puffs into the lungs every 6 (six) hours as needed for wheezing or shortness of  breath.     aspirin (ASPIRIN LOW DOSE) 81 MG EC tablet TAKE ONE TABLET BY MOUTH ONCE DAILY (MORNING) 90 tablet 3   atorvastatin (LIPITOR) 40 MG tablet TAKE ONE TABLET BY MOUTH EVERY DAY IN THE EVENING 30 tablet 6   bisoprolol (ZEBETA) 10 MG tablet TAKE 1 TABLET (10 MG TOTAL) BY MOUTH DAILY. (MORNING) 90 tablet 3   Fluticasone-Umeclidin-Vilant (TRELEGY ELLIPTA) 100-62.5-25 MCG/INH AEPB Inhale 1 puff into the lungs daily. 2 each 0   furosemide (LASIX) 20 MG tablet Take 1 tablet (20 mg total) by mouth daily. Or as directed by the heart failure clinic 30 tablet 11   haloperidol (HALDOL) 5 MG tablet Take 5 mg by mouth 2 (two) times daily.     hydrOXYzine (ATARAX/VISTARIL) 25 MG tablet Patient takes 1 capsule in the morning and at noon and he also takes 2 capsules at bedtime.     isosorbide-hydrALAZINE (BIDIL) 20-37.5 MG tablet TAKE 1 TABLET BY MOUTH 3 (THREE) TIMES DAILY  (AM+NOON+BEDTIME) 180 tablet 6   No facility-administered medications prior to visit.       Objective:   Physical Exam:  General appearance: 70 y.o., male, NAD, conversant, chronically ill appearing Eyes: anicteric sclerae; PERRL, tracking appropriately HENT: NCAT; MMM Neck: Trachea midline; no lymphadenopathy, no JVD Lungs: diminished bilatearlly,  no crackles, no wheeze, with normal respiratory effort CV: RRR, no murmur  Abdomen: Soft, non-tender; non-distended, BS present  Extremities: No peripheral edema, warm Skin: Normal turgor and texture; no rash Psych: Appropriate affect Neuro: Alert and oriented to person and place, no focal deficit     Vitals:   06/02/21 1433  BP: 90/60  Pulse: 78  Temp: 97.7 F (36.5 C)  TempSrc: Oral  SpO2: 95%  Weight: 127 lb 6.4 oz (57.8 kg)  Height: 5\' 10"  (1.778 m)   95% on RA BMI Readings from Last 3 Encounters:  06/02/21 18.28 kg/m  05/19/21 21.20 kg/m  05/05/21 21.57 kg/m   Wt Readings from Last 3 Encounters:  06/02/21 127 lb 6.4 oz (57.8 kg)  05/19/21 127 lb 6.4 oz (57.8 kg)  05/05/21 129 lb 9.6 oz (58.8 kg)     CBC    Component Value Date/Time   WBC 12.4 (H) 03/25/2021 0455   RBC 3.13 (L) 03/25/2021 0455   HGB 9.2 (L) 03/25/2021 0455   HCT 26.7 (L) 03/25/2021 0455   PLT 125 (L) 03/25/2021 0455   MCV 85.3 03/25/2021 0455   MCH 29.4 03/25/2021 0455   MCHC 34.5 03/25/2021 0455   RDW 13.7 03/25/2021 0455   LYMPHSABS 0.8 03/25/2021 0455   MONOABS 0.9 03/25/2021 0455   EOSABS 0.0 03/25/2021 0455   BASOSABS 0.1 03/25/2021 0455    Chest Imaging:  CT Chest 11/06/20 reviewed by me remarkable for emphysema, marked bronchial wall thickening, patulous esophagus, scattered stable nodules  Pulmonary Functions Testing Results: PFT Results Latest Ref Rng & Units 06/22/2017  FVC-Pre L 1.77  FVC-Predicted Pre % 50  FVC-Post L 2.18  FVC-Predicted Post % 62  Pre FEV1/FVC % % 49  Post FEV1/FCV % % 51  FEV1-Pre L 0.88   FEV1-Predicted Pre % 32  FEV1-Post L 1.12  DLCO uncorrected ml/min/mmHg 12.22  DLCO UNC% % 43  DLCO corrected ml/min/mmHg 12.55  DLCO COR %Predicted % 44  DLVA Predicted % 69  TLC L 6.83  TLC % Predicted % 106  RV % Predicted % 223   Severe obstruction and diffusing capacity impairment, +BD response, air trapping    Echocardiogram:  Tte 04/24/21: 1. Left ventricular ejection fraction, by estimation, is 55 to 60%. Left  ventricular ejection fraction by 3D volume is 52 %. The left ventricle has  normal function. The left ventricle has no regional wall motion  abnormalities. Left ventricular diastolic   parameters are consistent with Grade I diastolic dysfunction (impaired  relaxation).   2. Right ventricular systolic function is normal. The right ventricular  size is normal. There is normal pulmonary artery systolic pressure.   3. The mitral valve is normal in structure. No evidence of mitral valve  regurgitation. No evidence of mitral stenosis.   4. The aortic valve is normal in structure. Aortic valve regurgitation is  not visualized. Mild aortic valve sclerosis is present, with no evidence  of aortic valve stenosis.   5. The inferior vena cava is normal in size with greater than 50%  respiratory variability, suggesting right atrial pressure of 3 mmHg.     Assessment & Plan:   # COPD gold functional group D: Post BD FEV1 41%, good bronchodilator response, marked bronchial wall thickening on CT.  # Pulmonary nodules # Smoking  Plan: - change to high dose trelegy 200 1 puff once daily - pulmonary rehab - nicotine patches for smoking cessation - continue lung cancer screening     Maryjane Hurter, MD [facility] 06/02/2021 3:05 PM

## 2021-06-02 NOTE — Telephone Encounter (Signed)
CSW consulted to get pt transportation to pulmonology appt today- ride set up through [place], [facility] Desk#: [phone] Cell#: [phone]

## 2021-06-02 NOTE — Patient Instructions (Signed)
-   start using nicotine patches and stop smoking - stop using old trelegy inhaler and start using new higher dose trelegy inhaler 1 puff once daily - pulmonary rehab referral placed today - see you in 6 weeks

## 2021-06-02 NOTE — Telephone Encounter (Signed)
Spoke to [place] to confirm upcoming appointments and transportation needs. He reports having a ride set up with a friend for today's pulmonology appointment. I plan to see him in the home on Thursday. Call complete.

## 2021-06-03 DIAGNOSIS — E785 Hyperlipidemia, unspecified: Secondary | ICD-10-CM | POA: Diagnosis not present

## 2021-06-03 DIAGNOSIS — I10 Essential (primary) hypertension: Secondary | ICD-10-CM | POA: Diagnosis not present

## 2021-06-03 DIAGNOSIS — J441 Chronic obstructive pulmonary disease with (acute) exacerbation: Secondary | ICD-10-CM | POA: Diagnosis not present

## 2021-06-03 DIAGNOSIS — I509 Heart failure, unspecified: Secondary | ICD-10-CM | POA: Diagnosis not present

## 2021-06-03 DIAGNOSIS — J449 Chronic obstructive pulmonary disease, unspecified: Secondary | ICD-10-CM | POA: Diagnosis not present

## 2021-06-03 DIAGNOSIS — N184 Chronic kidney disease, stage 4 (severe): Secondary | ICD-10-CM | POA: Diagnosis not present

## 2021-06-03 DIAGNOSIS — K219 Gastro-esophageal reflux disease without esophagitis: Secondary | ICD-10-CM | POA: Diagnosis not present

## 2021-06-04 ENCOUNTER — Telehealth (HOSPITAL_COMMUNITY): Payer: Self-pay

## 2021-06-04 NOTE — Telephone Encounter (Signed)
Called Gerald Nelson to set up home visit for today and he declined wanting to reschedule to next week. He was reminded of his appointment at the HF clinic tomorrow at 2:00 and he stated he has transportation. Call complete and I will follow up next week.

## 2021-06-05 ENCOUNTER — Other Ambulatory Visit: Payer: Self-pay

## 2021-06-05 ENCOUNTER — Other Ambulatory Visit (HOSPITAL_COMMUNITY): Payer: Self-pay | Admitting: Cardiology

## 2021-06-05 ENCOUNTER — Encounter (HOSPITAL_COMMUNITY): Payer: Self-pay

## 2021-06-05 ENCOUNTER — Ambulatory Visit (HOSPITAL_COMMUNITY)
Admission: RE | Admit: 2021-06-05 | Discharge: 2021-06-05 | Disposition: A | Discharge status: Home / Self Care | Payer: Medicare Other | Source: Ambulatory Visit | Attending: Family Medicine | Admitting: Family Medicine

## 2021-06-05 VITALS — BP 120/76 | HR 70 | Wt 127.0 lb

## 2021-06-05 DIAGNOSIS — N1831 Chronic kidney disease, stage 3a: Secondary | ICD-10-CM | POA: Diagnosis not present

## 2021-06-05 DIAGNOSIS — Z8673 Personal history of transient ischemic attack (TIA), and cerebral infarction without residual deficits: Secondary | ICD-10-CM | POA: Insufficient documentation

## 2021-06-05 DIAGNOSIS — I5022 Chronic systolic (congestive) heart failure: Secondary | ICD-10-CM

## 2021-06-05 DIAGNOSIS — Z8249 Family history of ischemic heart disease and other diseases of the circulatory system: Secondary | ICD-10-CM | POA: Insufficient documentation

## 2021-06-05 DIAGNOSIS — J449 Chronic obstructive pulmonary disease, unspecified: Secondary | ICD-10-CM

## 2021-06-05 DIAGNOSIS — R7989 Other specified abnormal findings of blood chemistry: Secondary | ICD-10-CM | POA: Insufficient documentation

## 2021-06-05 DIAGNOSIS — I5042 Chronic combined systolic (congestive) and diastolic (congestive) heart failure: Secondary | ICD-10-CM | POA: Diagnosis not present

## 2021-06-05 DIAGNOSIS — I272 Pulmonary hypertension, unspecified: Secondary | ICD-10-CM | POA: Diagnosis not present

## 2021-06-05 DIAGNOSIS — Z7982 Long term (current) use of aspirin: Secondary | ICD-10-CM | POA: Diagnosis not present

## 2021-06-05 DIAGNOSIS — F209 Schizophrenia, unspecified: Secondary | ICD-10-CM | POA: Diagnosis not present

## 2021-06-05 DIAGNOSIS — I130 Hypertensive heart and chronic kidney disease with heart failure and stage 1 through stage 4 chronic kidney disease, or unspecified chronic kidney disease: Secondary | ICD-10-CM | POA: Diagnosis not present

## 2021-06-05 DIAGNOSIS — Z79899 Other long term (current) drug therapy: Secondary | ICD-10-CM | POA: Diagnosis not present

## 2021-06-05 DIAGNOSIS — N183 Chronic kidney disease, stage 3 unspecified: Secondary | ICD-10-CM | POA: Diagnosis not present

## 2021-06-05 DIAGNOSIS — I2510 Atherosclerotic heart disease of native coronary artery without angina pectoris: Secondary | ICD-10-CM | POA: Diagnosis not present

## 2021-06-05 DIAGNOSIS — F17210 Nicotine dependence, cigarettes, uncomplicated: Secondary | ICD-10-CM | POA: Insufficient documentation

## 2021-06-05 DIAGNOSIS — I340 Nonrheumatic mitral (valve) insufficiency: Secondary | ICD-10-CM | POA: Diagnosis not present

## 2021-06-05 LAB — BASIC METABOLIC PANEL
Anion gap: 10 (ref 5–15)
BUN: 45 mg/dL — ABNORMAL HIGH (ref 8–23)
CO2: 25 mmol/L (ref 22–32)
Calcium: 9.1 mg/dL (ref 8.9–10.3)
Chloride: 102 mmol/L (ref 98–111)
Creatinine, Ser: 2.4 mg/dL — ABNORMAL HIGH (ref 0.61–1.24)
GFR, Estimated: 29 mL/min — ABNORMAL LOW (ref >60)
Glucose, Bld: 83 mg/dL (ref 70–99)
Potassium: 4.5 mmol/L (ref 3.5–5.1)
Sodium: 137 mmol/L (ref 135–145)

## 2021-06-05 NOTE — Progress Notes (Signed)
Date:  06/05/2021   ID:  KAYLER RISE, DOB 1951/05/06, MRN [id number]   Provider location: [facility] Type of Visit: Established patient   PCP:  [name], MD  Cardiologist:  Dr. Aundra Dubin  Chief Complaint: CHF follow up   History of Present Illness: Gerald Nelson is a 70 y.o. male who has a past medical history of chronic systolic CHF (EF 94% in July 2018), severe MR/TR, pulmonary HTN, CVA, CKD, schizophrenia, HTN and tobacco abuse.    Gerald Nelson was admitted to [facility] 7/2-01/06/17 for newly diagnosed acute systolic CHF. Echo showed LVEF 15%, severe global hypokinesis, moderate LVH, coarse trabeculation of the LV apex with numerous false tendinae of the left ventricle, very stagnant blood flow at the LV apex with smoke but no obvious LV thrombus - Definity contrast was given, again, noting stagnant apical blood flow - this could suggest  recent thrombus and certainly high risk for apical thrombus  formation. Other findings include aortic sclerosis with mild AI, moderate to severe MR, moderate LAE, mild RAE, moderate to severe TR, moderate to severe pulmonary hypertension (RVSP 73 mmHg), dilated IVC, trivial posterior pericardial effusion.    He was admitted again in 10/18. He diuresed 25 pounds on IV lasix. He refused cath. His clonidine and diltazem were stopped during this admission. Referred to paramedicine. Discharge weight 133 pounds.    Echo 08/11/17 LVEF 15-20%, no MR noted.   Echo in 7/20 showed improvement in EF to 50-55%, moderate LVH, normal RV size and systolic function.  Echo in 9/21 showed EF 55% with moderate LVH, RV normal, IVC normal.  PYP scan in 11/21 was not suggestive of TTR cardiac amyloidosis.   Patient was admitted in 9/22 with AKI.  He was found to have COPD exacerbation.  V/Q scan was negative.  He had E coli bacteremia and there was also concern for aspiration PNA.  Creatinine peaked at 4.6 then improved.  He had been on Lasix 20 mg daily  prior to admission, this was stopped. Losartan and spironolactone were also stopped. He was sent home on Augmentin and prednisone for COPD exacerbation.   He was seen in the HF clinic 04/07/21. Multiple medication errors were reported. Referred to HF Paramedicine.   Today he returns for HF follow up. Overall feeling fine. He does not have dyspnea with exertion. Main complaint is cough x 1 year. Denies abnormal bleeding. Palpitations, CP, dizziness, edema, or PND/Orthopnea. Appetite ok. No fever or chills. He does not weigh at home. Taking all medications. Followed by HF Paramedicine. [facility] by [facility]. He has [employer]. Needs help with transportation. Says he has a degree in [employer] but it just never worked out. Smoking 1ppd.  Labs (6/19): K 3.8, creatinine 2.2 Labs (11/19): K 4.1, creatinine 1.95, LDL 54, HDl 54 Labs (12/19): K 4.6, creatinine 1.89 LabS (2/20): K 4.5, creatinine 1.68  Labs (4/20): K 4.5, creatinine 1.58 Labs (9/20): K 5.1, creatinine 2.02 Labs (10/20): LDL 67, K 4.8, creatinine 1.92 Labs (3/21): K 4.8, creatinine 1.97 Labs (9/22): K 4.3, creatinine 1.6 Labs (10/22): K 5.1 Creatinine 2 Labs (11/22): K 4.6, creatinine 2.32  PMH: 1. Chronic systolic CHF:  Cardiomyopathy of uncertain etiology, refused cath. He also refused ICD.  - Echo (7/18): Moderate LVH, severe FBSH, trabeculation at apex not meeting criteria for noncompaction, EF 15%, moderate to severe MR, low normal RV systolic function, moderate-severe TR.  - Echo (2/19): Moderate LVH, EF 15-20%, No mitral  regurgitation, normal RV size and systolic function, mild TR.  - Echo (7/20): EF 50-55%, moderate LVH, normal RV size and systolic function.  - Echo (9/21): EF 55%, moderate LVH, RV normal, IVC normal.  - PYP scan (11/21): No evidence for TTR cardiac amyloidosis.  - Echo (10/22): EF 55-60%, Grade I DD, RV normal 2. H/o CVA 3. Schizophrenia 4. CKD stage 3 5. Mitral regurgitation: Suspect functional.  Moderate-severe MR on 7/18 echo, minimal MR on 9/21 echo.  6. COPD: He is an active smoker.  - PFTs (12/18) suggestive of severe COPD.  7. H/o HTN 8. ? apical mural thrombus: Not seen on more recent echoes 9. CAD: 1/19 CT chest showed coronary calcification.   Current Outpatient Medications  Medication Sig Dispense Refill   albuterol (PROVENTIL HFA;VENTOLIN HFA) 108 (90 Base) MCG/ACT inhaler Inhale 1-2 puffs into the lungs every 6 (six) hours as needed for wheezing or shortness of breath.     aspirin (ASPIRIN LOW DOSE) 81 MG EC tablet TAKE ONE TABLET BY MOUTH ONCE DAILY (MORNING) 90 tablet 3   atorvastatin (LIPITOR) 40 MG tablet TAKE ONE TABLET BY MOUTH EVERY DAY IN THE EVENING 30 tablet 6   bisoprolol (ZEBETA) 10 MG tablet TAKE 1 TABLET (10 MG TOTAL) BY MOUTH DAILY. (MORNING) 90 tablet 3   Fluticasone-Umeclidin-Vilant (TRELEGY ELLIPTA) 200-62.5-25 MCG/ACT AEPB Inhale 1 puff into the lungs daily. 1 each 11   furosemide (LASIX) 20 MG tablet Take 1 tablet (20 mg total) by mouth daily. Or as directed by the heart failure clinic 30 tablet 11   haloperidol (HALDOL) 5 MG tablet Take 5 mg by mouth 2 (two) times daily.     hydrOXYzine (ATARAX/VISTARIL) 25 MG tablet Patient takes 1 capsule in the morning and at noon and he also takes 2 capsules at bedtime.     isosorbide-hydrALAZINE (BIDIL) 20-37.5 MG tablet TAKE 1 TABLET BY MOUTH 3 (THREE) TIMES DAILY (AM+NOON+BEDTIME) 180 tablet 6   nicotine (NICODERM CQ - DOSED IN MG/24 HOURS) 21 mg/24hr patch Place 1 patch (21 mg total) onto the skin daily. (Patient not taking: Reported on 06/05/2021) 28 patch 1   No current facility-administered medications for this encounter.    Allergies:   Sulfa antibiotics   Social History:  The patient  reports that he has been smoking cigarettes. He started smoking about 48 years ago. He has a 47.00 pack-year smoking history. He has never used smokeless tobacco. He reports that he does not drink alcohol and does not use  drugs.   Family History:  The patient's family history includes Hypertension in his father and mother.   ROS:  Please see the history of present illness.   All other systems are personally reviewed and negative.   Exam:   BP 120/76   Pulse 70   Wt 57.6 kg (127 lb)   SpO2 94%   BMI 18.22 kg/m  General:  NAD. No resp difficulty HEENT: Normal Neck: Supple. No JVD. Carotids 2+ bilat; no bruits. No lymphadenopathy or thryomegaly appreciated. Cor: PMI nondisplaced. Regular rate & rhythm. No rubs, gallops or murmurs. Lungs: Diminished in bases. Abdomen: Soft, nontender, nondistended. No hepatosplenomegaly. No bruits or masses. Good bowel sounds. Extremities: No cyanosis, clubbing, rash, edema Neuro: Alert & oriented x 3, cranial nerves grossly intact. Moves all 4 extremities w/o difficulty. Affect pleasant.  Recent Labs: 03/20/2021: TSH 0.329 03/22/2021: ALT 19 03/25/2021: Hemoglobin 9.2; Platelets 125 03/26/2021: Magnesium 1.6 03/30/2021: B Natriuretic Peptide 139.0 05/05/2021: BUN 38; Creatinine, Ser 2.32; Potassium  4.6; Sodium 134  Personally reviewed   Wt Readings from Last 3 Encounters:  06/05/21 57.6 kg (127 lb)  06/02/21 57.8 kg (127 lb 6.4 oz)  05/19/21 57.8 kg (127 lb 6.4 oz)     ASSESSMENT AND PLAN:  1. Chronic systolic => diastolic CHF: Echo 09/4285 EF 15% with moderate-severe MR. Echo 08/11/17 LVEF 15-20%, no MR noted. Cardiomyopathy of uncertain etiology, he has refused cath in the past.  Echo in 7/20 showed improvement in EF to 50-55%.  Echo in 9/21 showed EF stable 55%, moderate LVH, normal RV.  PYP scan not suggestive of TTR cardiac amyloidosis.  He was admitted 9/22 with AKI and E coli bacteremia, and losartan, spironolactone, and Lasix were stopped. Echo 10/22 EF 55-60%. Stable NYHA II. He is not volume overloaded on exam, weight is stable. - Continue lasix 20 mg daily.  - Continue bisoprolol 10 mg daily. - Off losartan & spiro with elevated creatinine and K also running  high.  - Continue Bidil 1 tab tid, 2. Questionable apical thrombus:  Not seen on 7/20 or 9/21 echoes. He is not anticoagulated.  3. Mitral regurgitation: Moderate to severe on 7/18 echo but trivial on 7/20, 9/21, and 10/22 echoes.  Suspect functional MR, improved with improved LV function.  4. COPD: He continues to smoke.  Severe COPD by prior PFTs.   5. CKD stage III: BMET today. 6. Schizophrenia: Per PCP.  7. CAD: Noted on CT chest.  Refused coronary angiography in the past.  ECG with possible old anteroseptal MI (unchanged), but echo shows EF back in normal range.  - Continue  ASA 81.  - Continue atorvastatin 40 mg daily.    - As above, recommended cath in past for diagnosis of CAD but he refused.     Follow up with Dr. Aundra Dubin in 3 months.  Signed, Rafael Bihari, FNP  06/05/2021  [facility] [address] [facility] [place] [address] [phone] (office) [phone] (fax)

## 2021-06-05 NOTE — Patient Instructions (Signed)
It was great to see you today! ?No medication changes are needed at this time. ? ? ?Labs today ?We will only contact you if something comes back abnormal or we need to make some changes. ?Otherwise no news is good news! ? ? ?Your physician recommends that you schedule a follow-up appointment in: 3 months with Dr. McLean ? ?Do the following things EVERYDAY: ?Weigh yourself in the morning before breakfast. Write it down and keep it in a log. ?Take your medicines as prescribed ?Eat low salt foods--Limit salt (sodium) to 2000 mg per day.  ?Stay as active as you can everyday ?Limit all fluids for the day to less than 2 liters ? ?At the [facility], you and your health needs are our priority. As part of our continuing mission to provide you with exceptional heart care, we have created designated Provider Care Teams. These Care Teams include your primary Cardiologist (physician) and Advanced Practice Providers (APPs- Physician Assistants and Nurse Practitioners) who all work together to provide you with the care you need, when you need it.  ? ?You may see any of the following providers on your designated Care Team at your next follow up: ?Dr Daniel Bensimhon ?Dr Dalton McLean ?Amy Clegg, NP ?Brittainy Simmons, PA ?Jessica Milford,NP ?Lindsay Finch, PA ?Lauren Kemp, PharmD ? ? ?Please be sure to bring in all your medications bottles to every appointment.  ? ?If you have any questions or concerns before your next appointment please send us a message through mychart or call our office at [phone].   ? ?TO LEAVE A MESSAGE FOR THE NURSE SELECT OPTION 2, PLEASE LEAVE A MESSAGE INCLUDING: ?YOUR NAME ?DATE OF BIRTH ?CALL BACK NUMBER ?REASON FOR CALL**this is important as we prioritize the call backs ? ?YOU WILL RECEIVE A CALL BACK THE SAME DAY AS LONG AS YOU CALL BEFORE 4:00 PM ? ? ?

## 2021-06-08 ENCOUNTER — Telehealth (HOSPITAL_COMMUNITY): Payer: Self-pay

## 2021-06-08 NOTE — Telephone Encounter (Signed)
Attempted to reach Gerald Nelson in reference to home visit with no success. I will continue to reach out.

## 2021-06-10 ENCOUNTER — Telehealth (HOSPITAL_COMMUNITY): Payer: Self-pay

## 2021-06-10 DIAGNOSIS — I5022 Chronic systolic (congestive) heart failure: Secondary | ICD-10-CM

## 2021-06-10 NOTE — Telephone Encounter (Signed)
Pt aware, agreeable, and verbalized understanding  BMET scheduled for 12/19 @ 9am

## 2021-06-10 NOTE — Telephone Encounter (Signed)
-----   Message from Rafael Bihari, [place] sent at 06/07/2021  8:14 PM EST ----- Kidney function remains elevated. Please decrease Lasix to 10 mg daily. Repeat BMET in 1-2 weeks.

## 2021-06-11 ENCOUNTER — Other Ambulatory Visit (HOSPITAL_COMMUNITY): Payer: Self-pay

## 2021-06-11 ENCOUNTER — Telehealth (HOSPITAL_COMMUNITY): Payer: Self-pay

## 2021-06-11 ENCOUNTER — Other Ambulatory Visit (HOSPITAL_COMMUNITY): Payer: Self-pay | Admitting: *Deleted

## 2021-06-11 MED ORDER — FUROSEMIDE 20 MG PO TABS: 10.0000 mg | ORAL_TABLET | Freq: Every day | ORAL | 3 refills | Status: DC

## 2021-06-11 NOTE — Telephone Encounter (Signed)
Pulmonary rehab office referral recv'ed, printed and given to RN for review. 

## 2021-06-11 NOTE — Telephone Encounter (Signed)
Called patient to see if he is interested in the Pulmonary Rehab Program. Patient expressed interest. Explained scheduling process and went over insurance process, patient verbalized understanding. Someone from our pulmonary rehab staff will contact pt at a later time. °

## 2021-06-11 NOTE — Progress Notes (Signed)
Paramedicine Encounter    Patient ID: Gerald Nelson, male    DOB: Dec 30, 1950, 70 y.o.   MRN: [id number]   Arrived for home visit for Gerald Nelson who was alert and oriented reporting to be feeling good today. He denied shortness of breath but had some wheezing on exam which is normal for him. He denied chest pain or dizziness. Gerald Nelson was ambulating without difficulty. He admits to being compliant with his medications.   Lasix reduced to 10mg  yesterday I opened bubble packs and cut lasix in half to create 10mg  dose and secured bubble packs with tape.   Vitals and exam as noted. We reviewed appointments. I will set up ride for 12/19 for labs. Home visit complete. I will see Gerald Nelson in two weeks.          Patient Care Team: [name], MD as PCP - General (Internal Medicine) [name], MD as PCP - Advanced Heart Failure (Cardiology) [name], LCSW as Social Worker (Licensed Clinical Social Worker)  Patient Active Problem List   Diagnosis Date Noted   Constipation 03/23/2021   Aspiration pneumonia due to gastric secretions ([place])    E coli bacteremia 03/22/2021   Acute renal failure (ARF) ([place]) 03/20/2021   COPD exacerbation ([place]) 05/01/2020   Stage 3 severe COPD by GOLD classification ([place]) 11/14/2018   Noncompliance 04/23/2017   Mitral regurgitation 04/23/2017   Thrombus - possible apical thrombus 01/2017 04/23/2017   Hyperlipidemia 04/23/2017   Pulmonary hypertension (HCC)    SOB (shortness of breath)    Palliative care by specialist    Tobacco abuse 04/19/2017   Acute respiratory failure with hypoxia ([place]) 01/27/2017   Hypertensive urgency 01/27/2017   CHF (congestive heart failure) ([place]) 01/26/2017   CKD (chronic kidney disease), stage III ([place]) 01/04/2017   Essential hypertension 01/04/2017   Cerebral infarction ([place]) 12/12/2013   Hypotension 12/06/2012   Leukocytosis 12/06/2012   Hyponatremia 12/06/2012   Hiatal hernia 12/06/2012   Syncope 12/06/2012    Schizophrenia (HCC)     Current Outpatient Medications:    albuterol (PROVENTIL HFA;VENTOLIN HFA) 108 (90 Base) MCG/ACT inhaler, Inhale 1-2 puffs into the lungs every 6 (six) hours as needed for wheezing or shortness of breath., Disp: , Rfl:    aspirin (ASPIRIN LOW DOSE) 81 MG EC tablet, TAKE ONE TABLET BY MOUTH ONCE DAILY (MORNING), Disp: 90 tablet, Rfl: 3   atorvastatin (LIPITOR) 40 MG tablet, TAKE ONE TABLET BY MOUTH EVERY DAY IN THE EVENING, Disp: 30 tablet, Rfl: 6   bisoprolol (ZEBETA) 10 MG tablet, TAKE 1 TABLET (10 MG TOTAL) BY MOUTH DAILY. (MORNING), Disp: 90 tablet, Rfl: 3   Fluticasone-Umeclidin-Vilant (TRELEGY ELLIPTA) 200-62.5-25 MCG/ACT AEPB, Inhale 1 puff into the lungs daily., Disp: 1 each, Rfl: 11   furosemide (LASIX) 20 MG tablet, Take 1 tablet (20 mg total) by mouth daily. Or as directed by the heart failure clinic, Disp: 30 tablet, Rfl: 11   haloperidol (HALDOL) 5 MG tablet, Take 5 mg by mouth 2 (two) times daily., Disp: , Rfl:    hydrOXYzine (ATARAX/VISTARIL) 25 MG tablet, Patient takes 1 capsule in the morning and at noon and he also takes 2 capsules at bedtime., Disp: , Rfl:    isosorbide-hydrALAZINE (BIDIL) 20-37.5 MG tablet, TAKE 1 TABLET BY MOUTH 3 (THREE) TIMES DAILY (AM+NOON+BEDTIME), Disp: 180 tablet, Rfl: 6   nicotine (NICODERM CQ - DOSED IN MG/24 HOURS) 21 mg/24hr patch, Place 1 patch (21 mg total) onto the skin daily. (Patient not taking: Reported  on 06/05/2021), Disp: 28 patch, Rfl: 1 Allergies  Allergen Reactions   Sulfa Antibiotics Nausea Only     Social History   Socioeconomic History   Marital status: Single    Spouse name: Not on file   Number of children: Not on file   Years of education: Not on file   Highest education level: Not on file  Occupational History   Occupation: Systems analyst  Tobacco Use   Smoking status: Every Day    Packs/day: 1.00    Years: 47.00    Pack years: 47.00    Types: Cigarettes    Start date: 1974   Smokeless  tobacco: Never   Tobacco comments:    requests patch         Pt smoke 1 pack a day//06/02/21  Vaping Use   Vaping Use: Never used  Substance and Sexual Activity   Alcohol use: No   Drug use: No   Sexual activity: Not Currently    Birth control/protection: None  Other Topics Concern   Not on file  Social History Narrative   Not on file   Social Determinants of Health   Financial Resource Strain: Not on file  Food Insecurity: Food Insecurity Present   Worried About [place] in the Last Year: Sometimes true   Ran Out of Food in the Last Year: Sometimes true  Transportation Needs: No Transportation Needs   Lack of Transportation (Medical): No   Lack of Transportation (Non-Medical): No  Physical Activity: Not on file  Stress: Not on file  Social Connections: Not on file  Intimate Partner Violence: Not on file    Physical Exam Vitals reviewed.  Constitutional:      Appearance: Normal appearance. He is normal weight.  HENT:     Nose: Nose normal.     Mouth/Throat:     Mouth: Mucous membranes are moist.     Pharynx: Oropharynx is clear.  Eyes:     Conjunctiva/sclera: Conjunctivae normal.     Pupils: Pupils are equal, round, and reactive to light.  Cardiovascular:     Rate and Rhythm: Normal rate and regular rhythm.     Pulses: Normal pulses.  Pulmonary:     Effort: Pulmonary effort is normal. No respiratory distress.     Breath sounds: Wheezing present.  Abdominal:     General: Abdomen is flat.     Palpations: Abdomen is soft.  Musculoskeletal:        General: No swelling. Normal range of motion.     Cervical back: Normal range of motion.     Right lower leg: No edema.     Left lower leg: No edema.  Skin:    General: Skin is warm and dry.     Capillary Refill: Capillary refill takes less than 2 seconds.  Neurological:     General: No focal deficit present.     Mental Status: He is alert. Mental status is at baseline.  Psychiatric:        Mood and  Affect: Mood normal.        Future Appointments  Date Time Provider [place]  06/22/2021  9:00 AM [facility] LAB [facility] None  07/28/2021  9:00 AM [facility] PFT ROOM [facility] None  07/28/2021 10:45 AM Maryjane Hurter, MD [facility] None  09/03/2021 11:00 AM [name], MD [facility] None     ACTION: Home visit completed

## 2021-06-16 ENCOUNTER — Telehealth (HOSPITAL_COMMUNITY): Payer: Self-pay

## 2021-06-16 NOTE — Telephone Encounter (Signed)
Ride ID:  [id number]  Transportation Type:  RIDESHARE (UBER/LYFT)  Pickup Date/Time:  06/22/21 at 8:20 AM (EST)  (Appointment Time: 9:00 AM (EST))  [facility]  [address] [place], [place] [address], [place] Drop-off Address  [address] [place], [place] [address], [place]

## 2021-06-18 ENCOUNTER — Telehealth (HOSPITAL_COMMUNITY): Payer: Self-pay

## 2021-06-18 NOTE — Telephone Encounter (Signed)
Called to remind Gerald Nelson of his appointment Monday at HF clinic for labs at 0900. His ride is set up and he was reminded of pick up time for 0820. He agreed. I will follow up for home visit next week. Call complete.

## 2021-06-22 ENCOUNTER — Other Ambulatory Visit: Payer: Self-pay

## 2021-06-22 ENCOUNTER — Telehealth (HOSPITAL_COMMUNITY): Payer: Self-pay

## 2021-06-22 ENCOUNTER — Ambulatory Visit (HOSPITAL_COMMUNITY)
Admission: RE | Admit: 2021-06-22 | Discharge: 2021-06-22 | Disposition: A | Discharge status: Home / Self Care | Payer: Medicare Other | Source: Ambulatory Visit | Attending: Internal Medicine | Admitting: Internal Medicine

## 2021-06-22 DIAGNOSIS — I5022 Chronic systolic (congestive) heart failure: Secondary | ICD-10-CM | POA: Insufficient documentation

## 2021-06-22 LAB — BASIC METABOLIC PANEL
Anion gap: 6 (ref 5–15)
BUN: 20 mg/dL (ref 8–23)
CO2: 28 mmol/L (ref 22–32)
Calcium: 9.3 mg/dL (ref 8.9–10.3)
Chloride: 105 mmol/L (ref 98–111)
Creatinine, Ser: 1.62 mg/dL — ABNORMAL HIGH (ref 0.61–1.24)
GFR, Estimated: 46 mL/min — ABNORMAL LOW (ref >60)
Glucose, Bld: 86 mg/dL (ref 70–99)
Potassium: 4.6 mmol/L (ref 3.5–5.1)
Sodium: 139 mmol/L (ref 135–145)

## 2021-06-22 NOTE — Telephone Encounter (Signed)
Appointment Reminder call for [employer].  He agreed and understood ride pick up time for 0820. Call complete.

## 2021-06-25 ENCOUNTER — Encounter (HOSPITAL_COMMUNITY): Payer: Self-pay | Admitting: *Deleted

## 2021-06-25 NOTE — Progress Notes (Signed)
Received referral from Dr. Darien Ramus for this pt to participate in pulmonary rehab with the diagnosis of COPD Stage 3.  Pt completed PFT on 06/22/17  FEV1/FVC actual 51; pred 66; FEV1 post bronch 41. Clinical review of pt follow up appt on 11/29 Pulmonary office note. Also reviewed progress notes for Heart failure clinic follow ups.  Pt with Covid Risk Score - 6. Pt appropriate for scheduling for Pulmonary rehab.  Will forward to support staff for scheduling and verification of insurance eligibility/benefits with pt consent. Cherre Huger, BSN Cardiac and Training and development officer

## 2021-07-02 ENCOUNTER — Encounter (HOSPITAL_COMMUNITY): Payer: Medicare Other | Admitting: Cardiology

## 2021-07-07 ENCOUNTER — Telehealth (HOSPITAL_COMMUNITY): Payer: Self-pay

## 2021-07-07 NOTE — Telephone Encounter (Signed)
Spoke with Gerald Nelson, attempting to set up a home visit for this week however he reports he has a job interview today with [employer] in [employer] where he once worked. He agreed on home visit next week after he gets his work schedule at his orientation on Thursday. I will follow up next week on Monday.    He called me back and reports he got the job and that he needed to know his upcoming appointments. I provided him with same and he continued to agree with home visit next week and stated he felt good and has his bubble packs and reported no issues.   Call complete.

## 2021-07-14 ENCOUNTER — Telehealth (HOSPITAL_COMMUNITY): Payer: Self-pay

## 2021-07-14 ENCOUNTER — Other Ambulatory Visit (HOSPITAL_COMMUNITY): Payer: Self-pay

## 2021-07-14 NOTE — Telephone Encounter (Signed)
°  Ride ID:  [id number]  Transportation Type:  RIDESHARE (UBER/LYFT)  Pickup Date/Time:  07/28/21 at 8:15 AM (EST)  [facility] Address  [address] [place], [place] [address], [place] Drop-off Address  [address] [place], [place] [address], [place]

## 2021-07-14 NOTE — Progress Notes (Signed)
Paramedicine Encounter    Patient ID: Gerald Nelson, male    DOB: May 05, 1951, 71 y.o.   MRN: [id number]  Met with Kwasi today for home visit where he was alert and oriented ambulating within his room. He had his normal breathing efforts but wheezing noted on auscultation. Vitals were obtained and assessment as noted. No swelling noted. No JVD, no abdominal distention. He denied pain, dizziness or trouble preforming tasks. I reviewed medications and verified bubble packs. All were correct. We reviewed appointments and confirmed same. I will set up ride for him for 1/24 appointment.   Home visit complete. I will see Tashan in one month.   Patient Care Team: [name], MD as PCP - General (Internal Medicine) [name], MD as PCP - Advanced Heart Failure (Cardiology) [name], LCSW as Social Worker (Licensed Clinical Social Worker)  Patient Active Problem List   Diagnosis Date Noted   Constipation 03/23/2021   Aspiration pneumonia due to gastric secretions ([place])    E coli bacteremia 03/22/2021   Acute renal failure (ARF) ([place]) 03/20/2021   COPD exacerbation ([place]) 05/01/2020   Stage 3 severe COPD by GOLD classification ([place]) 11/14/2018   Noncompliance 04/23/2017   Mitral regurgitation 04/23/2017   Thrombus - possible apical thrombus 01/2017 04/23/2017   Hyperlipidemia 04/23/2017   Pulmonary hypertension (HCC)    SOB (shortness of breath)    Palliative care by specialist    Tobacco abuse 04/19/2017   Acute respiratory failure with hypoxia ([place]) 01/27/2017   Hypertensive urgency 01/27/2017   CHF (congestive heart failure) ([place]) 01/26/2017   CKD (chronic kidney disease), stage III ([place]) 01/04/2017   Essential hypertension 01/04/2017   Cerebral infarction ([place]) 12/12/2013   Hypotension 12/06/2012   Leukocytosis 12/06/2012   Hyponatremia 12/06/2012   Hiatal hernia 12/06/2012   Syncope 12/06/2012   Schizophrenia (HCC)     Current Outpatient Medications:    albuterol  (PROVENTIL HFA;VENTOLIN HFA) 108 (90 Base) MCG/ACT inhaler, Inhale 1-2 puffs into the lungs every 6 (six) hours as needed for wheezing or shortness of breath., Disp: , Rfl:    aspirin (ASPIRIN LOW DOSE) 81 MG EC tablet, TAKE ONE TABLET BY MOUTH ONCE DAILY (MORNING), Disp: 90 tablet, Rfl: 3   atorvastatin (LIPITOR) 40 MG tablet, TAKE ONE TABLET BY MOUTH EVERY DAY IN THE EVENING, Disp: 30 tablet, Rfl: 6   bisoprolol (ZEBETA) 10 MG tablet, TAKE 1 TABLET (10 MG TOTAL) BY MOUTH DAILY. (MORNING), Disp: 90 tablet, Rfl: 3   Fluticasone-Umeclidin-Vilant (TRELEGY ELLIPTA) 200-62.5-25 MCG/ACT AEPB, Inhale 1 puff into the lungs daily., Disp: 1 each, Rfl: 11   furosemide (LASIX) 20 MG tablet, Take 0.5 tablets (10 mg total) by mouth daily., Disp: 45 tablet, Rfl: 3   haloperidol (HALDOL) 5 MG tablet, Take 5 mg by mouth 2 (two) times daily., Disp: , Rfl:    hydrOXYzine (ATARAX/VISTARIL) 25 MG tablet, Patient takes 1 capsule in the morning and at noon and he also takes 2 capsules at bedtime., Disp: , Rfl:    isosorbide-hydrALAZINE (BIDIL) 20-37.5 MG tablet, TAKE 1 TABLET BY MOUTH 3 (THREE) TIMES DAILY (AM+NOON+BEDTIME), Disp: 180 tablet, Rfl: 6   nicotine (NICODERM CQ - DOSED IN MG/24 HOURS) 21 mg/24hr patch, Place 1 patch (21 mg total) onto the skin daily. (Patient not taking: Reported on 06/05/2021), Disp: 28 patch, Rfl: 1 Allergies  Allergen Reactions   Sulfa Antibiotics Nausea Only     Social History   Socioeconomic History   Marital status: Single  Spouse name: Not on file   Number of children: Not on file   Years of education: Not on file   Highest education level: Not on file  Occupational History   Occupation: Systems analyst  Tobacco Use   Smoking status: Every Day    Packs/day: 1.00    Years: 47.00    Pack years: 47.00    Types: Cigarettes    Start date: 1974   Smokeless tobacco: Never   Tobacco comments:    requests patch         Pt smoke 1 pack a day//06/02/21  Vaping Use   Vaping  Use: Never used  Substance and Sexual Activity   Alcohol use: No   Drug use: No   Sexual activity: Not Currently    Birth control/protection: None  Other Topics Concern   Not on file  Social History Narrative   Not on file   Social Determinants of Health   Financial Resource Strain: Not on file  Food Insecurity: Food Insecurity Present   Worried About [place] in the Last Year: Sometimes true   Ran Out of Food in the Last Year: Sometimes true  Transportation Needs: No Transportation Needs   Lack of Transportation (Medical): No   Lack of Transportation (Non-Medical): No  Physical Activity: Not on file  Stress: Not on file  Social Connections: Not on file  Intimate Partner Violence: Not on file    Physical Exam Vitals reviewed.  Constitutional:      Appearance: Normal appearance. He is normal weight.  HENT:     Head: Normocephalic.     Nose: Nose normal.     Mouth/Throat:     Mouth: Mucous membranes are moist.     Pharynx: Oropharynx is clear.  Eyes:     Conjunctiva/sclera: Conjunctivae normal.     Pupils: Pupils are equal, round, and reactive to light.  Cardiovascular:     Rate and Rhythm: Normal rate and regular rhythm.     Pulses: Normal pulses.     Heart sounds: Normal heart sounds.  Pulmonary:     Effort: Pulmonary effort is normal.     Breath sounds: Wheezing present.  Abdominal:     General: Abdomen is flat.     Palpations: Abdomen is soft.  Musculoskeletal:        General: No swelling. Normal range of motion.     Cervical back: Normal range of motion.     Right lower leg: No edema.     Left lower leg: No edema.  Skin:    General: Skin is warm and dry.     Capillary Refill: Capillary refill takes less than 2 seconds.  Neurological:     General: No focal deficit present.     Mental Status: He is alert. Mental status is at baseline.  Psychiatric:        Mood and Affect: Mood normal.        Future Appointments  Date Time Provider  [place]  07/28/2021  9:00 AM [facility] PFT ROOM [facility] None  07/28/2021 10:45 AM Maryjane Hurter, MD [facility] None  09/03/2021 11:00 AM [name], MD [facility] None     ACTION: Home visit completed

## 2021-07-23 ENCOUNTER — Emergency Department (HOSPITAL_COMMUNITY)
Admission: EM | Admit: 2021-07-23 | Discharge: 2021-07-23 | Disposition: A | Discharge status: Home / Self Care | Payer: Medicare Other | Attending: Emergency Medicine | Admitting: Emergency Medicine

## 2021-07-23 ENCOUNTER — Encounter (HOSPITAL_COMMUNITY): Payer: Self-pay

## 2021-07-23 ENCOUNTER — Other Ambulatory Visit: Payer: Self-pay

## 2021-07-23 DIAGNOSIS — Z5321 Procedure and treatment not carried out due to patient leaving prior to being seen by health care provider: Secondary | ICD-10-CM | POA: Insufficient documentation

## 2021-07-23 DIAGNOSIS — R1084 Generalized abdominal pain: Secondary | ICD-10-CM | POA: Diagnosis not present

## 2021-07-23 LAB — DIFFERENTIAL
Abs Immature Granulocytes: 0.06 10*3/uL (ref 0.00–0.07)
Basophils Absolute: 0.1 10*3/uL (ref 0.0–0.1)
Basophils Relative: 1 %
Eosinophils Absolute: 0.1 10*3/uL (ref 0.0–0.5)
Eosinophils Relative: 1 %
Immature Granulocytes: 1 %
Lymphocytes Relative: 23 %
Lymphs Abs: 1.9 10*3/uL (ref 0.7–4.0)
Monocytes Absolute: 1 10*3/uL (ref 0.1–1.0)
Monocytes Relative: 12 %
Neutro Abs: 4.9 10*3/uL (ref 1.7–7.7)
Neutrophils Relative %: 62 %

## 2021-07-23 LAB — CBC
HCT: 42.8 % (ref 39.0–52.0)
Hemoglobin: 13.9 g/dL (ref 13.0–17.0)
MCH: 28.4 pg (ref 26.0–34.0)
MCHC: 32.5 g/dL (ref 30.0–36.0)
MCV: 87.3 fL (ref 80.0–100.0)
Platelets: 197 10*3/uL (ref 150–400)
RBC: 4.9 MIL/uL (ref 4.22–5.81)
RDW: 13.2 % (ref 11.5–15.5)
WBC: 8 10*3/uL (ref 4.0–10.5)
nRBC: 0 % (ref 0.0–0.2)

## 2021-07-23 LAB — COMPREHENSIVE METABOLIC PANEL
ALT: 20 U/L (ref 0–44)
AST: 27 U/L (ref 15–41)
Albumin: 4 g/dL (ref 3.5–5.0)
Alkaline Phosphatase: 70 U/L (ref 38–126)
Anion gap: 10 (ref 5–15)
BUN: 20 mg/dL (ref 8–23)
CO2: 22 mmol/L (ref 22–32)
Calcium: 9.6 mg/dL (ref 8.9–10.3)
Chloride: 102 mmol/L (ref 98–111)
Creatinine, Ser: 2.11 mg/dL — ABNORMAL HIGH (ref 0.61–1.24)
GFR, Estimated: 33 mL/min — ABNORMAL LOW (ref >60)
Glucose, Bld: 80 mg/dL (ref 70–99)
Potassium: 4.5 mmol/L (ref 3.5–5.1)
Sodium: 134 mmol/L — ABNORMAL LOW (ref 135–145)
Total Bilirubin: 1.1 mg/dL (ref 0.3–1.2)
Total Protein: 7.6 g/dL (ref 6.5–8.1)

## 2021-07-23 LAB — LIPASE, BLOOD: Lipase: 40 U/L (ref 11–51)

## 2021-07-23 NOTE — ED Triage Notes (Signed)
Pt arrived POV from home c/o abdominal pain and a headache x 3 days. Pt deneis any N/V/D.

## 2021-07-23 NOTE — ED Provider Triage Note (Signed)
Emergency Medicine Provider Triage Evaluation Note  Gerald Nelson , a 71 y.o. male  was evaluated in triage.  Pt complains of abdominal pain.  Constant for the last 3 to 5 days, generalized over the abdomen without any specific localization.  Nothing makes it better or worse, no previous abdominal surgeries.  History of "bad kidneys"..  Review of Systems  Positive: Generalized abdominal pain Negative: Chest pain, shortness of breath, nausea, vomiting, dysuria, hematuria  Physical Exam  BP (!) 159/99 (BP Location: Left Arm)    Pulse 84    Temp 97.7 F (36.5 C) (Oral)    Resp 18    Ht 5\' 10"  (1.778 m)    Wt 59 kg    SpO2 98%    BMI 18.65 kg/m  Gen:   Awake, no distress   Resp:  Normal effort  MSK:   Moves extremities without difficulty  Other:  Diffuse abdominal pain  Medical Decision Making  Medically screening exam initiated at 1:58 PM.  Appropriate orders placed.  Gerald Nelson was informed that the remainder of the evaluation will be completed by another provider, this initial triage assessment does not replace that evaluation, and the importance of remaining in the ED until their evaluation is complete.  Labs to start   Sherrill Raring, [place] 07/23/21 1359

## 2021-07-23 NOTE — ED Notes (Signed)
Pt stated his stomach ache went away and can not wait any longer.

## 2021-07-24 DIAGNOSIS — R10811 Right upper quadrant abdominal tenderness: Secondary | ICD-10-CM | POA: Diagnosis not present

## 2021-07-27 ENCOUNTER — Ambulatory Visit
Admission: RE | Admit: 2021-07-27 | Discharge: 2021-07-27 | Disposition: A | Discharge status: Home / Self Care | Payer: Medicare Other | Source: Ambulatory Visit | Attending: Internal Medicine | Admitting: Internal Medicine

## 2021-07-27 ENCOUNTER — Other Ambulatory Visit: Payer: Self-pay | Admitting: Internal Medicine

## 2021-07-27 DIAGNOSIS — R1011 Right upper quadrant pain: Secondary | ICD-10-CM | POA: Diagnosis not present

## 2021-07-27 DIAGNOSIS — K449 Diaphragmatic hernia without obstruction or gangrene: Secondary | ICD-10-CM | POA: Diagnosis not present

## 2021-07-27 DIAGNOSIS — R10811 Right upper quadrant abdominal tenderness: Secondary | ICD-10-CM

## 2021-07-27 NOTE — Progress Notes (Deleted)
Synopsis: Referred for pulmonary hypertension by [name], MD  Subjective:   PATIENT ID: Gerald Nelson GENDER: male DOB: Feb 27, 1951, MRN: [id number]  No chief complaint on file.   71yM with history of CHF, COPD, CKD, smoking 1ppd active  He says he has DOE to 6 blocks. Is gradually worsening. He does have a bit of a productive cough. No fever. He says he is taking trelegy 1 puff once daily.   He has needed several courses of steroids this year for COPD.   Interval HPI: Last seen by me 06/02/21 at which point increased to trelegy 200 and referred to pulmonary rehab (not yet started). Nicotine patches for smoking cessation. He had PFTs done today.  Otherwise pertinent review of systems is negative.  Past Medical History:  Diagnosis Date   Apical mural thrombus    a. question of apical thrombus on echo 01/2017, pt left AMA, not felt to be anticoag candidate with noncompliance.   Arthritis    CHF (congestive heart failure) (HCC)    Chronic systolic CHF (congestive heart failure) ([place])    a. pt refused cath. EF 15% 01/2017.   CKD (chronic kidney disease), stage III (HCC)    COPD (chronic obstructive pulmonary disease) (HCC)    Hernia, hiatal    Hyperlipidemia    Hypertension    Hyponatremia    Mitral regurgitation    a. mod-severe by echo 01/2017.   Pulmonary hypertension (HCC)    Schizophrenia ([place])    Tobacco abuse      Family History  Problem Relation Age of Onset   Hypertension Mother    Hypertension Father      Past Surgical History:  Procedure Laterality Date   PENILE PROSTHESIS IMPLANT      Social History   Socioeconomic History   Marital status: Single    Spouse name: Not on file   Number of children: Not on file   Years of education: Not on file   Highest education level: Not on file  Occupational History   Occupation: Systems analyst  Tobacco Use   Smoking status: Every Day    Packs/day: 1.00    Years: 47.00    Pack years: 47.00    Types:  Cigarettes    Start date: 1974   Smokeless tobacco: Never   Tobacco comments:    requests patch         Pt smoke 1 pack a day//06/02/21  Vaping Use   Vaping Use: Never used  Substance and Sexual Activity   Alcohol use: No   Drug use: No   Sexual activity: Not Currently    Birth control/protection: None  Other Topics Concern   Not on file  Social History Narrative   Not on file   Social Determinants of Health   Financial Resource Strain: Not on file  Food Insecurity: Food Insecurity Present   Worried About [place] in the Last Year: Sometimes true   Ran Out of Food in the Last Year: Sometimes true  Transportation Needs: No Transportation Needs   Lack of Transportation (Medical): No   Lack of Transportation (Non-Medical): No  Physical Activity: Not on file  Stress: Not on file  Social Connections: Not on file  Intimate Partner Violence: Not on file     Allergies  Allergen Reactions   Sulfa Antibiotics Nausea Only     Outpatient Medications Prior to Visit  Medication Sig Dispense Refill   albuterol (PROVENTIL HFA;VENTOLIN HFA) 108 (90 Base)  MCG/ACT inhaler Inhale 1-2 puffs into the lungs every 6 (six) hours as needed for wheezing or shortness of breath.     aspirin (ASPIRIN LOW DOSE) 81 MG EC tablet TAKE ONE TABLET BY MOUTH ONCE DAILY (MORNING) 90 tablet 3   atorvastatin (LIPITOR) 40 MG tablet TAKE ONE TABLET BY MOUTH EVERY DAY IN THE EVENING 30 tablet 6   bisoprolol (ZEBETA) 10 MG tablet TAKE 1 TABLET (10 MG TOTAL) BY MOUTH DAILY. (MORNING) 90 tablet 3   Fluticasone-Umeclidin-Vilant (TRELEGY ELLIPTA) 200-62.5-25 MCG/ACT AEPB Inhale 1 puff into the lungs daily. 1 each 11   furosemide (LASIX) 20 MG tablet Take 0.5 tablets (10 mg total) by mouth daily. 45 tablet 3   haloperidol (HALDOL) 5 MG tablet Take 5 mg by mouth 2 (two) times daily.     hydrOXYzine (ATARAX/VISTARIL) 25 MG tablet Patient takes 1 capsule in the morning and at noon and he also takes 2 capsules  at bedtime.     isosorbide-hydrALAZINE (BIDIL) 20-37.5 MG tablet TAKE 1 TABLET BY MOUTH 3 (THREE) TIMES DAILY (AM+NOON+BEDTIME) 180 tablet 6   nicotine (NICODERM CQ - DOSED IN MG/24 HOURS) 21 mg/24hr patch Place 1 patch (21 mg total) onto the skin daily. (Patient not taking: Reported on 06/05/2021) 28 patch 1   No facility-administered medications prior to visit.       Objective:   Physical Exam:  General appearance: 71 y.o., male, NAD, conversant, chronically ill appearing Eyes: anicteric sclerae; PERRL, tracking appropriately HENT: NCAT; MMM Neck: Trachea midline; no lymphadenopathy, no JVD Lungs: diminished bilatearlly,  no crackles, no wheeze, with normal respiratory effort CV: RRR, no murmur  Abdomen: Soft, non-tender; non-distended, BS present  Extremities: No peripheral edema, warm Skin: Normal turgor and texture; no rash Psych: Appropriate affect Neuro: Alert and oriented to person and place, no focal deficit     There were no vitals filed for this visit.    on RA BMI Readings from Last 3 Encounters:  07/23/21 18.65 kg/m  07/14/21 18.37 kg/m  06/11/21 18.14 kg/m   Wt Readings from Last 3 Encounters:  07/23/21 130 lb (59 kg)  07/14/21 128 lb (58.1 kg)  06/11/21 126 lb 6.4 oz (57.3 kg)     CBC    Component Value Date/Time   WBC 8.0 07/23/2021 1356   RBC 4.90 07/23/2021 1356   HGB 13.9 07/23/2021 1356   HCT 42.8 07/23/2021 1356   PLT 197 07/23/2021 1356   MCV 87.3 07/23/2021 1356   MCH 28.4 07/23/2021 1356   MCHC 32.5 07/23/2021 1356   RDW 13.2 07/23/2021 1356   LYMPHSABS 1.9 07/23/2021 1356   MONOABS 1.0 07/23/2021 1356   EOSABS 0.1 07/23/2021 1356   BASOSABS 0.1 07/23/2021 1356    Chest Imaging:  CT Chest 11/06/20 reviewed by me remarkable for emphysema, marked bronchial wall thickening, patulous esophagus, scattered stable nodules  CT A/P 07/27/21 lung bases reviewed by me  with emphysema but no acute findings.  Pulmonary Functions Testing  Results: PFT Results Latest Ref Rng & Units 06/22/2017  FVC-Pre L 1.77  FVC-Predicted Pre % 50  FVC-Post L 2.18  FVC-Predicted Post % 62  Pre FEV1/FVC % % 49  Post FEV1/FCV % % 51  FEV1-Pre L 0.88  FEV1-Predicted Pre % 32  FEV1-Post L 1.12  DLCO uncorrected ml/min/mmHg 12.22  DLCO UNC% % 43  DLCO corrected ml/min/mmHg 12.55  DLCO COR %Predicted % 44  DLVA Predicted % 69  TLC L 6.83  TLC % Predicted % 106  RV % Predicted % 223   Severe obstruction and diffusing capacity impairment, +BD response, air trapping    Echocardiogram:   Tte 04/24/21: 1. Left ventricular ejection fraction, by estimation, is 55 to 60%. Left  ventricular ejection fraction by 3D volume is 52 %. The left ventricle has  normal function. The left ventricle has no regional wall motion  abnormalities. Left ventricular diastolic   parameters are consistent with Grade I diastolic dysfunction (impaired  relaxation).   2. Right ventricular systolic function is normal. The right ventricular  size is normal. There is normal pulmonary artery systolic pressure.   3. The mitral valve is normal in structure. No evidence of mitral valve  regurgitation. No evidence of mitral stenosis.   4. The aortic valve is normal in structure. Aortic valve regurgitation is  not visualized. Mild aortic valve sclerosis is present, with no evidence  of aortic valve stenosis.   5. The inferior vena cava is normal in size with greater than 50%  respiratory variability, suggesting right atrial pressure of 3 mmHg.     Assessment & Plan:   # COPD gold functional group D: Post BD FEV1 41%, good bronchodilator response, marked bronchial wall thickening on CT.  # Pulmonary nodules # Smoking  Plan: - high dose trelegy 200 1 puff once daily - albuterol prn - pulmonary rehab - nicotine patches for smoking cessation - continue lung cancer screening     Maryjane Hurter, MD [facility] 07/27/2021 3:10 PM

## 2021-07-28 ENCOUNTER — Ambulatory Visit: Payer: Medicare Other | Admitting: Student

## 2021-07-29 DIAGNOSIS — K219 Gastro-esophageal reflux disease without esophagitis: Secondary | ICD-10-CM | POA: Diagnosis not present

## 2021-07-29 DIAGNOSIS — E785 Hyperlipidemia, unspecified: Secondary | ICD-10-CM | POA: Diagnosis not present

## 2021-07-29 DIAGNOSIS — J449 Chronic obstructive pulmonary disease, unspecified: Secondary | ICD-10-CM | POA: Diagnosis not present

## 2021-07-29 DIAGNOSIS — J441 Chronic obstructive pulmonary disease with (acute) exacerbation: Secondary | ICD-10-CM | POA: Diagnosis not present

## 2021-07-29 DIAGNOSIS — N189 Chronic kidney disease, unspecified: Secondary | ICD-10-CM | POA: Diagnosis not present

## 2021-07-29 DIAGNOSIS — I10 Essential (primary) hypertension: Secondary | ICD-10-CM | POA: Diagnosis not present

## 2021-07-30 ENCOUNTER — Telehealth (HOSPITAL_COMMUNITY): Payer: Self-pay

## 2021-07-30 NOTE — Telephone Encounter (Signed)
Spoke to Mr. 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 He agreed and understands to reach out to clinic and PCP if he needs anything further. Call complete.

## 2021-08-04 ENCOUNTER — Other Ambulatory Visit: Payer: Self-pay | Admitting: Internal Medicine

## 2021-08-04 ENCOUNTER — Other Ambulatory Visit: Payer: Self-pay

## 2021-08-04 ENCOUNTER — Ambulatory Visit
Admission: RE | Admit: 2021-08-04 | Discharge: 2021-08-04 | Disposition: A | Discharge status: Home / Self Care | Payer: Medicare Other | Source: Ambulatory Visit | Attending: Internal Medicine | Admitting: Internal Medicine

## 2021-08-04 DIAGNOSIS — R1030 Lower abdominal pain, unspecified: Secondary | ICD-10-CM | POA: Diagnosis not present

## 2021-08-04 DIAGNOSIS — K5900 Constipation, unspecified: Secondary | ICD-10-CM

## 2021-08-14 ENCOUNTER — Other Ambulatory Visit: Payer: Self-pay | Admitting: Internal Medicine

## 2021-08-14 ENCOUNTER — Ambulatory Visit
Admission: RE | Admit: 2021-08-14 | Discharge: 2021-08-14 | Disposition: A | Discharge status: Home / Self Care | Payer: Medicare Other | Source: Ambulatory Visit | Attending: Internal Medicine | Admitting: Internal Medicine

## 2021-08-14 DIAGNOSIS — J449 Chronic obstructive pulmonary disease, unspecified: Secondary | ICD-10-CM | POA: Diagnosis not present

## 2021-08-14 DIAGNOSIS — R062 Wheezing: Secondary | ICD-10-CM | POA: Diagnosis not present

## 2021-08-14 DIAGNOSIS — R059 Cough, unspecified: Secondary | ICD-10-CM | POA: Diagnosis not present

## 2021-08-14 DIAGNOSIS — R519 Headache, unspecified: Secondary | ICD-10-CM | POA: Diagnosis not present

## 2021-08-14 DIAGNOSIS — K30 Functional dyspepsia: Secondary | ICD-10-CM | POA: Diagnosis not present

## 2021-08-27 DIAGNOSIS — R7309 Other abnormal glucose: Secondary | ICD-10-CM | POA: Diagnosis not present

## 2021-08-27 DIAGNOSIS — I5022 Chronic systolic (congestive) heart failure: Secondary | ICD-10-CM | POA: Diagnosis not present

## 2021-08-27 DIAGNOSIS — J449 Chronic obstructive pulmonary disease, unspecified: Secondary | ICD-10-CM | POA: Diagnosis not present

## 2021-08-27 DIAGNOSIS — I272 Pulmonary hypertension, unspecified: Secondary | ICD-10-CM | POA: Diagnosis not present

## 2021-08-27 DIAGNOSIS — I700 Atherosclerosis of aorta: Secondary | ICD-10-CM | POA: Diagnosis not present

## 2021-08-27 DIAGNOSIS — I679 Cerebrovascular disease, unspecified: Secondary | ICD-10-CM | POA: Diagnosis not present

## 2021-08-27 DIAGNOSIS — I10 Essential (primary) hypertension: Secondary | ICD-10-CM | POA: Diagnosis not present

## 2021-08-27 DIAGNOSIS — N184 Chronic kidney disease, stage 4 (severe): Secondary | ICD-10-CM | POA: Diagnosis not present

## 2021-08-27 DIAGNOSIS — E785 Hyperlipidemia, unspecified: Secondary | ICD-10-CM | POA: Diagnosis not present

## 2021-08-27 DIAGNOSIS — Z0000 Encounter for general adult medical examination without abnormal findings: Secondary | ICD-10-CM | POA: Diagnosis not present

## 2021-08-28 DIAGNOSIS — J441 Chronic obstructive pulmonary disease with (acute) exacerbation: Secondary | ICD-10-CM | POA: Diagnosis not present

## 2021-08-28 DIAGNOSIS — I10 Essential (primary) hypertension: Secondary | ICD-10-CM | POA: Diagnosis not present

## 2021-09-03 ENCOUNTER — Other Ambulatory Visit: Payer: Self-pay

## 2021-09-03 ENCOUNTER — Ambulatory Visit (HOSPITAL_COMMUNITY)
Admission: RE | Admit: 2021-09-03 | Discharge: 2021-09-03 | Disposition: A | Discharge status: Home / Self Care | Payer: Medicare Other | Source: Ambulatory Visit | Attending: Cardiology | Admitting: Cardiology

## 2021-09-03 ENCOUNTER — Encounter (HOSPITAL_COMMUNITY): Payer: Self-pay | Admitting: Cardiology

## 2021-09-03 VITALS — BP 120/70 | HR 71 | Wt 136.8 lb

## 2021-09-03 DIAGNOSIS — I5042 Chronic combined systolic (congestive) and diastolic (congestive) heart failure: Secondary | ICD-10-CM | POA: Diagnosis not present

## 2021-09-03 DIAGNOSIS — I272 Pulmonary hypertension, unspecified: Secondary | ICD-10-CM | POA: Diagnosis not present

## 2021-09-03 DIAGNOSIS — Z8673 Personal history of transient ischemic attack (TIA), and cerebral infarction without residual deficits: Secondary | ICD-10-CM | POA: Diagnosis not present

## 2021-09-03 DIAGNOSIS — I130 Hypertensive heart and chronic kidney disease with heart failure and stage 1 through stage 4 chronic kidney disease, or unspecified chronic kidney disease: Secondary | ICD-10-CM | POA: Insufficient documentation

## 2021-09-03 DIAGNOSIS — Z79899 Other long term (current) drug therapy: Secondary | ICD-10-CM | POA: Diagnosis not present

## 2021-09-03 DIAGNOSIS — Z8249 Family history of ischemic heart disease and other diseases of the circulatory system: Secondary | ICD-10-CM | POA: Insufficient documentation

## 2021-09-03 DIAGNOSIS — D631 Anemia in chronic kidney disease: Secondary | ICD-10-CM | POA: Diagnosis not present

## 2021-09-03 DIAGNOSIS — F17210 Nicotine dependence, cigarettes, uncomplicated: Secondary | ICD-10-CM | POA: Insufficient documentation

## 2021-09-03 DIAGNOSIS — F209 Schizophrenia, unspecified: Secondary | ICD-10-CM | POA: Diagnosis not present

## 2021-09-03 DIAGNOSIS — I2510 Atherosclerotic heart disease of native coronary artery without angina pectoris: Secondary | ICD-10-CM | POA: Insufficient documentation

## 2021-09-03 DIAGNOSIS — I083 Combined rheumatic disorders of mitral, aortic and tricuspid valves: Secondary | ICD-10-CM | POA: Insufficient documentation

## 2021-09-03 DIAGNOSIS — Z7984 Long term (current) use of oral hypoglycemic drugs: Secondary | ICD-10-CM | POA: Diagnosis not present

## 2021-09-03 DIAGNOSIS — I129 Hypertensive chronic kidney disease with stage 1 through stage 4 chronic kidney disease, or unspecified chronic kidney disease: Secondary | ICD-10-CM | POA: Diagnosis not present

## 2021-09-03 DIAGNOSIS — I5022 Chronic systolic (congestive) heart failure: Secondary | ICD-10-CM

## 2021-09-03 DIAGNOSIS — J449 Chronic obstructive pulmonary disease, unspecified: Secondary | ICD-10-CM | POA: Diagnosis not present

## 2021-09-03 DIAGNOSIS — N184 Chronic kidney disease, stage 4 (severe): Secondary | ICD-10-CM | POA: Diagnosis not present

## 2021-09-03 DIAGNOSIS — N183 Chronic kidney disease, stage 3 unspecified: Secondary | ICD-10-CM | POA: Diagnosis not present

## 2021-09-03 DIAGNOSIS — Z8619 Personal history of other infectious and parasitic diseases: Secondary | ICD-10-CM | POA: Insufficient documentation

## 2021-09-03 DIAGNOSIS — Z7982 Long term (current) use of aspirin: Secondary | ICD-10-CM | POA: Diagnosis not present

## 2021-09-03 DIAGNOSIS — E785 Hyperlipidemia, unspecified: Secondary | ICD-10-CM | POA: Diagnosis not present

## 2021-09-03 DIAGNOSIS — N2581 Secondary hyperparathyroidism of renal origin: Secondary | ICD-10-CM | POA: Diagnosis not present

## 2021-09-03 DIAGNOSIS — I509 Heart failure, unspecified: Secondary | ICD-10-CM | POA: Diagnosis not present

## 2021-09-03 LAB — BASIC METABOLIC PANEL
Anion gap: 9 (ref 5–15)
BUN: 28 mg/dL — ABNORMAL HIGH (ref 8–23)
CO2: 27 mmol/L (ref 22–32)
Calcium: 9.1 mg/dL (ref 8.9–10.3)
Chloride: 104 mmol/L (ref 98–111)
Creatinine, Ser: 2 mg/dL — ABNORMAL HIGH (ref 0.61–1.24)
GFR, Estimated: 36 mL/min — ABNORMAL LOW (ref >60)
Glucose, Bld: 91 mg/dL (ref 70–99)
Potassium: 4 mmol/L (ref 3.5–5.1)
Sodium: 140 mmol/L (ref 135–145)

## 2021-09-03 LAB — BRAIN NATRIURETIC PEPTIDE: B Natriuretic Peptide: 85 pg/mL (ref 0.0–100.0)

## 2021-09-03 MED ORDER — DAPAGLIFLOZIN PROPANEDIOL 10 MG PO TABS: 10.0000 mg | ORAL_TABLET | Freq: Every day | ORAL | 3 refills | Status: DC

## 2021-09-03 MED ORDER — POTASSIUM CHLORIDE CRYS ER 20 MEQ PO TBCR: 20.0000 meq | EXTENDED_RELEASE_TABLET | Freq: Every day | ORAL | 3 refills | Status: DC

## 2021-09-03 MED ORDER — FUROSEMIDE 20 MG PO TABS: 20.0000 mg | ORAL_TABLET | Freq: Every day | ORAL | 3 refills | Status: DC

## 2021-09-03 NOTE — Progress Notes (Signed)
ReDS Vest / Clip - 09/03/21 1100   ? ?  ? ReDS Vest / Clip  ? Station Marker C   ? Ruler Value 25   ? ReDS Value Range Moderate volume overload   ? ReDS Actual Value 38   ? ?  ?  ? ?  ? ? ?

## 2021-09-03 NOTE — Patient Instructions (Signed)
Medication Changes: ? ?Start Farxgia 10 mg daily ? ?Increase lasix to 20 mg daily  ?  ?Start potassium 10Meq daily  ? ?Lab Work: ? ?Labs done today, your results will be available in MyChart, we will contact you for abnormal readings. ? ? ?Testing/Procedures: ? ?Repeat blood work in 10 days  ? ?Referrals: ? ?none ? ?Special Instructions // Education: ? ?none ? ?Follow-Up in: 6 weeks  ? ?At the [facility], you and your health needs are our priority. We have a designated team specialized in the treatment of Heart Failure. This Care Team includes your primary Heart Failure Specialized Cardiologist (physician), Advanced Practice Providers (APPs- Physician Assistants and Nurse Practitioners), and Pharmacist who all work together to provide you with the care you need, when you need it.  ? ?You may see any of the following providers on your designated Care Team at your next follow up: ? ?Dr Glori Bickers ?Dr Loralie Champagne ?Darrick Grinder, NP ?Lyda Jester, PA ?Jessica Milford,NP ?Marlyce Huge, PA ?Audry Riles, PharmD ? ? ?Please be sure to bring in all your medications bottles to every appointment.  ? ?Need to Contact us: ? ?If you have any questions or concerns before your next appointment please send [place] a message through [place] or call our office at [phone].   ? ?TO LEAVE A MESSAGE FOR THE NURSE SELECT OPTION 2, PLEASE LEAVE A MESSAGE INCLUDING: ?YOUR NAME ?DATE OF BIRTH ?CALL BACK NUMBER ?REASON FOR CALL**this is important as we prioritize the call backs ? ?YOU WILL RECEIVE A CALL BACK THE SAME DAY AS LONG AS YOU CALL BEFORE 4:00 PM ? ? ?

## 2021-09-03 NOTE — Progress Notes (Signed)
Date:  09/03/2021   ID:  Gerald Nelson, DOB Dec 03, 1950, MRN [id number]   Provider location: [facility] Type of Visit: Established patient   PCP:  [name], MD  Cardiologist:  Dr. Aundra Dubin  Chief Complaint: Shortness of breath   History of Present Illness: Gerald Nelson is a 71 y.o. male who has a past medical history of chronic systolic CHF (EF 11% in July 2018), severe MR/TR, pulmonary HTN, CVA, CKD, schizophrenia, HTN and tobacco abuse.    [name] was admitted to [facility] 7/2-01/06/17 for newly diagnosed acute systolic CHF. Echo showed LVEF 15%, severe global hypokinesis, moderate LVH, coarse trabeculation of the LV apex with numerous false tendinae of the left ventricle, very stagnant blood flow at the LV apex with smoke but no obvious LV thrombus - Definity contrast was given, again, noting stagnant apical blood flow - this could suggest  recent thrombus and certainly high risk for apical thrombus  formation. Other findings include aortic sclerosis with mild AI, moderate to severe MR, moderate LAE, mild RAE, moderate to severe TR, moderate to severe pulmonary hypertension (RVSP 73 mmHg), dilated IVC, trivial posterior pericardial effusion.    He was admitted again in 10/18. He diuresed 25 pounds on IV lasix. He refused cath. His clonidine and diltazem were stopped during this admission. Referred to paramedicine. Discharge weight 133 pounds.    Echo 08/11/17 LVEF 15-20%, no MR noted.   Echo in 7/20 showed improvement in EF to 50-55%, moderate LVH, normal RV size and systolic function.  Echo in 9/21 showed EF 55% with moderate LVH, RV normal, IVC normal.  PYP scan in 11/21 was not suggestive of TTR cardiac amyloidosis.   Patient was admitted in 9/22 with AKI.  He was found to have COPD exacerbation.  V/Q scan was negative.  He had E coli bacteremia and there was also concern for aspiration PNA.  Creatinine peaked at 4.6 then improved.  He had been on Lasix 20 mg  daily prior to admission, this was stopped. Losartan and spironolactone were also stopped. He was sent home on Augmentin and prednisone for COPD exacerbation.   Echo in 10/22 showed EF 55-60%, normal RV, IVC normal.    He presents today for followup of CHF.  He is still smoking 1 ppd.  Weight is up 9 lbs.  He is short of breath walking up stairs and inclines.  OK on flat ground.  No orthopnea/PND.  No lightheadedness.  No wheezing.  No palpitations.      REDS clip 38%   Labs (6/19): K 3.8, creatinine 2.2 Labs (11/19): K 4.1, creatinine 1.95, LDL 54, HDl 54 Labs (12/19): K 4.6, creatinine 1.89 LabS (2/20): K 4.5, creatinine 1.68  Labs (4/20): K 4.5, creatinine 1.58 Labs (9/20): K 5.1, creatinine 2.02 Labs (10/20): LDL 67, K 4.8, creatinine 1.92 Labs (3/21): K 4.8, creatinine 1.97 Labs (9/22): K 4.3, creatinine 1.6, LDL 63 Labs (1/23): K 4.5, creatinine 2.11   PMH: 1. Chronic systolic CHF:  Cardiomyopathy of uncertain etiology, refused cath. He also refused ICD.  - Echo (7/18): Moderate LVH, severe FBSH, trabeculation at apex not meeting criteria for noncompaction, EF 15%, moderate to severe MR, low normal RV systolic function, moderate-severe TR.  - Echo (2/19): Moderate LVH, EF 15-20%, No mitral regurgitation, normal RV size and systolic function, mild TR.  - Echo (7/20): EF 50-55%, moderate LVH, normal RV size and systolic function.  - Echo (9/21): EF 55%, moderate LVH,  RV normal, IVC normal.  - PYP scan (11/21): No evidence for TTR cardiac amyloidosis.  - Echo (10/22): EF 55-60%, normal RV, IVC normal. 2. H/o CVA 3. Schizophrenia 4. CKD stage 3 5. Mitral regurgitation: Suspect functional. Moderate-severe MR on 7/18 echo, minimal MR on 9/21 echo.  6. COPD: He is an active smoker.  - PFTs (12/18) suggestive of severe COPD.  7. H/o HTN 8. ? apical mural thrombus: Not seen on more recent echoes 9. CAD: 1/19 CT chest showed coronary calcification.   Current Outpatient Medications   Medication Sig Dispense Refill   albuterol (PROVENTIL HFA;VENTOLIN HFA) 108 (90 Base) MCG/ACT inhaler Inhale 1-2 puffs into the lungs every 6 (six) hours as needed for wheezing or shortness of breath.     aspirin (ASPIRIN LOW DOSE) 81 MG EC tablet TAKE ONE TABLET BY MOUTH ONCE DAILY (MORNING) 90 tablet 3   atorvastatin (LIPITOR) 40 MG tablet TAKE ONE TABLET BY MOUTH EVERY DAY IN THE EVENING 30 tablet 6   bisoprolol (ZEBETA) 10 MG tablet TAKE 1 TABLET (10 MG TOTAL) BY MOUTH DAILY. (MORNING) 90 tablet 3   dapagliflozin propanediol (FARXIGA) 10 MG TABS tablet Take 1 tablet (10 mg total) by mouth daily before breakfast. 90 tablet 3   Fluticasone-Umeclidin-Vilant (TRELEGY ELLIPTA) 200-62.5-25 MCG/ACT AEPB Inhale 1 puff into the lungs daily. 1 each 11   haloperidol (HALDOL) 5 MG tablet Take 5 mg by mouth 2 (two) times daily.     hydrOXYzine (ATARAX/VISTARIL) 25 MG tablet Patient takes 1 capsule in the morning and at noon and he also takes 2 capsules at bedtime.     isosorbide-hydrALAZINE (BIDIL) 20-37.5 MG tablet TAKE 1 TABLET BY MOUTH 3 (THREE) TIMES DAILY (AM+NOON+BEDTIME) 180 tablet 6   potassium chloride SA (KLOR-CON M20) 20 MEQ tablet Take 1 tablet (20 mEq total) by mouth daily. 90 tablet 3   furosemide (LASIX) 20 MG tablet Take 1 tablet (20 mg total) by mouth daily. 45 tablet 3   No current facility-administered medications for this encounter.    Allergies:   Sulfa antibiotics   Social History:  The patient  reports that he has been smoking cigarettes. He started smoking about 49 years ago. He has a 47.00 pack-year smoking history. He has never used smokeless tobacco. He reports that he does not drink alcohol and does not use drugs.   Family History:  The patient's family history includes Hypertension in his father and mother.   ROS:  Please see the history of present illness.   All other systems are personally reviewed and negative.   Exam:   BP 120/70    Pulse 71    Wt 62.1 kg (136 lb  12.8 oz)    SpO2 98%    BMI 19.63 kg/m  General: NAD Neck: JVP 8 cm with HJR, no thyromegaly or thyroid nodule.  Lungs: Distant BS CV: Nondisplaced PMI.  Heart regular S1/S2, no S3/S4, no murmur.  No peripheral edema.  No carotid bruit.  Normal pedal pulses.  Abdomen: Soft, nontender, no hepatosplenomegaly, no distention.  Skin: Intact without lesions or rashes.  Neurologic: Alert and oriented x 3.  Psych: Normal affect. Extremities: No clubbing or cyanosis.  HEENT: Normal.   Recent Labs: 03/20/2021: TSH 0.329 03/26/2021: Magnesium 1.6 07/23/2021: ALT 20; Hemoglobin 13.9; Platelets 197 09/03/2021: B Natriuretic Peptide 85.0; BUN 28; Creatinine, Ser 2.00; Potassium 4.0; Sodium 140  Personally reviewed   Wt Readings from Last 3 Encounters:  09/03/21 62.1 kg (136 lb 12.8 oz)  07/23/21 59 kg (130 lb)  07/14/21 58.1 kg (128 lb)      ASSESSMENT AND PLAN:  1. Chronic systolic => diastolic CHF: Echo 02/33 EF 15% with moderate-severe MR. Echo 08/11/17 LVEF 15-20%, no MR noted. Cardiomyopathy of uncertain etiology, he has refused cath in the past.  Echo in 7/20 showed improvement in EF to 50-55%.  Echo in 9/21 showed EF stable 55%, moderate LVH, normal RV.  PYP scan not suggestive of TTR cardiac amyloidosis.  Echo in 10/22 with stable EF 55-60%.  NYHA class II-III symptoms.  I think that his symptoms are likely driven by COPD, but he also appears to have mild volume overload and weight is up.   - Increase Lasix to 20 mg daily with KCl 10 daily.  BMET/BNP today and BMET in 10 days.  - Continue bisoprolol 10 mg daily.  - Leave off spironolactone and losartan with elevated creatinine and normalized EF.   - Continue Bidil 1 tab tid.  - Add Farxiga 10 mg daily.  2. Questionable apical thrombus:  Not seen on 7/20, 9/21, or 10/22 echoes. He is not anticoagulated.  3. Mitral regurgitation: Moderate to severe on 7/18 echo but trivial on 7/20, 9/21, 10/22 echoes.  Suspect functional MR, improved with  improved LV function.  4. COPD: He continues to smoke.  Severe COPD by prior PFTs.  Suspect COPD exacerbation plays major role in symptoms.  - I encouraged him to quit smoking.  5. CKD stage III:  BMET today.   6. Schizophrenia: Per PCP.  7. CAD: Noted on CT chest.  Refused coronary angiography in the past.  ECG with possible old anteroseptal MI, but echo has shown EF back in normal range.  - Continue ASA 81.  - Continue atorvastatin 40 mg daily, good lipids in 9/22.    - As above, recommended cath in past for diagnosis of CAD but he refused.  Think reasonable to hold off now with improved EF and no chest pain.    Followup with APP in 6 wks.   Signed, Loralie Champagne, MD  09/03/2021  [facility] [address] [facility] [place] [address] [phone] (office) [phone] (fax)

## 2021-09-15 ENCOUNTER — Other Ambulatory Visit (HOSPITAL_COMMUNITY): Payer: Medicare Other

## 2021-09-21 NOTE — Progress Notes (Signed)
? ?Synopsis: Referred for pulmonary hypertension by [name], MD ? ?Subjective:  ? ?PATIENT ID: Gerald Nelson GENDER: male DOB: 02-05-1951, MRN: [id number] ? ?Chief Complaint  ?Patient presents with  ? Follow-up  ?  Breathing is unchanged since the last visit. He is using his albuterol inhaler 2-3 x per day.   ? ? ?46yM with history of CHF, COPD, CKD, smoking 1ppd active ? ?He says he has DOE to 6 blocks. Is gradually worsening. He does have a bit of a productive cough. No fever. He says he is taking trelegy 1 puff once daily.  ? ?He has needed several courses of steroids this year for COPD.  ? ?Interval HPI ?Last seen by me 05/2021 at which point started on high dose trelegy, referred to pulm rehab and lung cancer screening, nicotine patches for smoking cessation. No change in DOE since last visit. He doesn't feel like he has much in way of cough or chest congestion but he clearly sounds rhonchorous throughout our visit. ? ?Seen by CHF 3/2 at which point increased lasix to 20 mg daily, added farxiga. ? ?Still smoking less than a pack a day or so. ? ?Otherwise pertinent review of systems is negative. ? ?Past Medical History:  ?Diagnosis Date  ? Apical mural thrombus   ? a. question of apical thrombus on echo 01/2017, pt left AMA, not felt to be anticoag candidate with noncompliance.  ? Arthritis   ? CHF (congestive heart failure) ([place])   ? Chronic systolic CHF (congestive heart failure) ([place])   ? a. pt refused cath. EF 15% 01/2017.  ? CKD (chronic kidney disease), stage III ([place])   ? COPD (chronic obstructive pulmonary disease) ([place])   ? Hernia, hiatal   ? Hyperlipidemia   ? Hypertension   ? Hyponatremia   ? Mitral regurgitation   ? a. mod-severe by echo 01/2017.  ? Pulmonary hypertension ([place])   ? Schizophrenia ([place])   ? Tobacco abuse   ?  ? ?Family History  ?Problem Relation Age of Onset  ? Hypertension Mother   ? Hypertension Father   ?  ? ?Past Surgical History:  ?Procedure Laterality Date  ? PENILE PROSTHESIS  IMPLANT    ? ? ?Social History  ? ?Socioeconomic History  ? Marital status: Single  ?  Spouse name: Not on file  ? Number of children: Not on file  ? Years of education: Not on file  ? Highest education level: Not on file  ?Occupational History  ? Occupation: Systems analyst  ?Tobacco Use  ? Smoking status: Every Day  ?  Packs/day: 1.00  ?  Years: 47.00  ?  Pack years: 47.00  ?  Types: Cigarettes  ?  Start date: 71  ? Smokeless tobacco: Never  ? Tobacco comments:  ?  requests patch   ?    ?  Pt smoke 1 pack a day//06/02/21  ?Vaping Use  ? Vaping Use: Never used  ?Substance and Sexual Activity  ? Alcohol use: No  ? Drug use: No  ? Sexual activity: Not Currently  ?  Birth control/protection: None  ?Other Topics Concern  ? Not on file  ?Social History Narrative  ? Not on file  ? ?Social Determinants of Health  ? ?Financial Resource Strain: Not on file  ?Food Insecurity: Food Insecurity Present  ? Worried About Charity fundraiser in the Last Year: Sometimes true  ? Ran Out of Food in the Last Year: Sometimes true  ?Transportation Needs:  No Transportation Needs  ? Lack of Transportation (Medical): No  ? Lack of Transportation (Non-Medical): No  ?Physical Activity: Not on file  ?Stress: Not on file  ?Social Connections: Not on file  ?Intimate Partner Violence: Not on file  ?  ? ?Allergies  ?Allergen Reactions  ? Sulfa Antibiotics Nausea Only  ?  ? ?Outpatient Medications Prior to Visit  ?Medication Sig Dispense Refill  ? albuterol (PROVENTIL HFA;VENTOLIN HFA) 108 (90 Base) MCG/ACT inhaler Inhale 1-2 puffs into the lungs every 6 (six) hours as needed for wheezing or shortness of breath.    ? aspirin (ASPIRIN LOW DOSE) 81 MG EC tablet TAKE ONE TABLET BY MOUTH ONCE DAILY (MORNING) 90 tablet 3  ? atorvastatin (LIPITOR) 40 MG tablet TAKE ONE TABLET BY MOUTH EVERY DAY IN THE EVENING 30 tablet 6  ? bisoprolol (ZEBETA) 10 MG tablet TAKE 1 TABLET (10 MG TOTAL) BY MOUTH DAILY. (MORNING) 90 tablet 3  ? dapagliflozin propanediol  (FARXIGA) 10 MG TABS tablet Take 1 tablet (10 mg total) by mouth daily before breakfast. 90 tablet 3  ? Fluticasone-Umeclidin-Vilant (TRELEGY ELLIPTA) 200-62.5-25 MCG/ACT AEPB Inhale 1 puff into the lungs daily. 1 each 11  ? furosemide (LASIX) 20 MG tablet Take 1 tablet (20 mg total) by mouth daily. 45 tablet 3  ? haloperidol (HALDOL) 5 MG tablet Take 5 mg by mouth 2 (two) times daily.    ? hydrOXYzine (ATARAX/VISTARIL) 25 MG tablet Patient takes 1 capsule in the morning and at noon and he also takes 2 capsules at bedtime.    ? isosorbide-hydrALAZINE (BIDIL) 20-37.5 MG tablet TAKE 1 TABLET BY MOUTH 3 (THREE) TIMES DAILY (AM+NOON+BEDTIME) 180 tablet 6  ? ketoconazole (NIZORAL) 2 % cream Apply 1 application. topically daily. 60 g 2  ? potassium chloride SA (KLOR-CON M20) 20 MEQ tablet Take 1 tablet (20 mEq total) by mouth daily. 90 tablet 3  ? ?No facility-administered medications prior to visit.  ? ? ? ? ? ?Objective:  ? ?Physical Exam: ? ?General appearance: 71 y.o., male, NAD, conversant, chronically ill appearing ?Eyes: anicteric sclerae; PERRL, tracking appropriately ?HENT: NCAT; MMM ?Neck: Trachea midline; no lymphadenopathy, no JVD ?Lungs: diminished bilatearlly,  no crackles, no wheeze, with normal respiratory effort ?CV: RRR, no murmur  ?Abdomen: Soft, non-tender; non-distended, BS present  ?Extremities: No peripheral edema, warm ?Skin: Normal turgor and texture; no rash ?Psych: Appropriate affect ?Neuro: Alert and oriented to person and place, no focal deficit  ? ? ? ?Vitals:  ? 09/22/21 1520  ?BP: 136/90  ?Pulse: 80  ?Temp: 98 ?F (36.7 ?C)  ?TempSrc: Oral  ?SpO2: 98%  ?Weight: 128 lb (58.1 kg)  ?Height: 5\' 10"  (1.778 m)  ? ? ?98% on RA ?BMI Readings from Last 3 Encounters:  ?09/22/21 18.37 kg/m?  ?09/03/21 19.63 kg/m?  ?07/23/21 18.65 kg/m?  ? ?Wt Readings from Last 3 Encounters:  ?09/22/21 128 lb (58.1 kg)  ?09/03/21 136 lb 12.8 oz (62.1 kg)  ?07/23/21 130 lb (59 kg)  ? ? ? ?CBC ?   ?Component Value  Date/Time  ? WBC 8.0 07/23/2021 1356  ? RBC 4.90 07/23/2021 1356  ? HGB 13.9 07/23/2021 1356  ? HCT 42.8 07/23/2021 1356  ? PLT 197 07/23/2021 1356  ? MCV 87.3 07/23/2021 1356  ? MCH 28.4 07/23/2021 1356  ? MCHC 32.5 07/23/2021 1356  ? RDW 13.2 07/23/2021 1356  ? LYMPHSABS 1.9 07/23/2021 1356  ? MONOABS 1.0 07/23/2021 1356  ? EOSABS 0.1 07/23/2021 1356  ? BASOSABS 0.1 07/23/2021  1356  ? ? ?Chest Imaging: ? ?CXR 08/17/21 reviewed by me with hyperinflation ? ?CT Chest 11/06/20 reviewed by me remarkable for emphysema, marked bronchial wall thickening, patulous esophagus, scattered stable nodules ? ?Pulmonary Functions Testing Results: ?PFT Results Latest Ref Rng & Units 06/22/2017  ?FVC-Pre L 1.77  ?FVC-Predicted Pre % 50  ?FVC-Post L 2.18  ?FVC-Predicted Post % 62  ?Pre FEV1/FVC % % 49  ?Post FEV1/FCV % % 51  ?FEV1-Pre L 0.88  ?FEV1-Predicted Pre % 32  ?FEV1-Post L 1.12  ?DLCO uncorrected ml/min/mmHg 12.22  ?DLCO UNC% % 43  ?DLCO corrected ml/min/mmHg 12.55  ?DLCO COR %Predicted % 44  ?DLVA Predicted % 69  ?TLC L 6.83  ?TLC % Predicted % 106  ?RV % Predicted % 223  ? ?Severe obstruction and diffusing capacity impairment, +BD response, air trapping ? ? ? ?Echocardiogram:  ? ?Tte 04/24/21: ?1. Left ventricular ejection fraction, by estimation, is 55 to 60%. Left  ?ventricular ejection fraction by 3D volume is 52 %. The left ventricle has  ?normal function. The left ventricle has no regional wall motion  ?abnormalities. Left ventricular diastolic  ? parameters are consistent with Grade I diastolic dysfunction (impaired  ?relaxation).  ? 2. Right ventricular systolic function is normal. The right ventricular  ?size is normal. There is normal pulmonary artery systolic pressure.  ? 3. The mitral valve is normal in structure. No evidence of mitral valve  ?regurgitation. No evidence of mitral stenosis.  ? 4. The aortic valve is normal in structure. Aortic valve regurgitation is  ?not visualized. Mild aortic valve sclerosis is  present, with no evidence  ?of aortic valve stenosis.  ? 5. The inferior vena cava is normal in size with greater than 50%  ?respiratory variability, suggesting right atrial pressure of 3 mmHg. ? ?   ?Assess

## 2021-09-22 ENCOUNTER — Ambulatory Visit (INDEPENDENT_AMBULATORY_CARE_PROVIDER_SITE_OTHER): Payer: Medicare Other | Admitting: Student

## 2021-09-22 ENCOUNTER — Ambulatory Visit (INDEPENDENT_AMBULATORY_CARE_PROVIDER_SITE_OTHER): Payer: Medicare Other | Admitting: Podiatry

## 2021-09-22 ENCOUNTER — Other Ambulatory Visit: Payer: Self-pay

## 2021-09-22 ENCOUNTER — Encounter: Payer: Self-pay | Admitting: Student

## 2021-09-22 VITALS — BP 136/90 | HR 80 | Temp 98.0°F | Ht 70.0 in | Wt 128.0 lb

## 2021-09-22 DIAGNOSIS — F17210 Nicotine dependence, cigarettes, uncomplicated: Secondary | ICD-10-CM

## 2021-09-22 DIAGNOSIS — M79674 Pain in right toe(s): Secondary | ICD-10-CM

## 2021-09-22 DIAGNOSIS — M79675 Pain in left toe(s): Secondary | ICD-10-CM | POA: Diagnosis not present

## 2021-09-22 DIAGNOSIS — B351 Tinea unguium: Secondary | ICD-10-CM | POA: Diagnosis not present

## 2021-09-22 DIAGNOSIS — B353 Tinea pedis: Secondary | ICD-10-CM

## 2021-09-22 DIAGNOSIS — J449 Chronic obstructive pulmonary disease, unspecified: Secondary | ICD-10-CM | POA: Diagnosis not present

## 2021-09-22 DIAGNOSIS — F17200 Nicotine dependence, unspecified, uncomplicated: Secondary | ICD-10-CM

## 2021-09-22 MED ORDER — VARENICLINE TARTRATE 1 MG PO TABS: 1.0000 mg | ORAL_TABLET | Freq: Two times a day (BID) | ORAL | 1 refills | Status: DC

## 2021-09-22 MED ORDER — VARENICLINE TARTRATE 0.5 MG X 11 & 1 MG X 42 PO TBPK: ORAL_TABLET | ORAL | 0 refills | Status: DC

## 2021-09-22 MED ORDER — KETOCONAZOLE 2 % EX CREA: 1.0000 "application " | TOPICAL_CREAM | Freq: Every day | CUTANEOUS | 2 refills | Status: DC

## 2021-09-22 NOTE — Patient Instructions (Addendum)
-   start chantix - see instructions for dosing increase. Skip evening dose if you get nightmares. Stop smoking ideally after first week of being on chantix ?- keep using trelegy 1 puff once daily, rinse mouth afterward ?- start flutter valve 10 puffs daily after trelegy ?- you'll be called to schedule pulmonary rehab and ct lung cancer screening ?- see you in 6 weeks! ?

## 2021-09-24 ENCOUNTER — Other Ambulatory Visit: Payer: Self-pay

## 2021-09-24 ENCOUNTER — Ambulatory Visit (HOSPITAL_COMMUNITY)
Admission: RE | Admit: 2021-09-24 | Discharge: 2021-09-24 | Disposition: A | Discharge status: Home / Self Care | Payer: Medicare Other | Source: Ambulatory Visit | Attending: Adult Health | Admitting: Adult Health

## 2021-09-24 DIAGNOSIS — I5022 Chronic systolic (congestive) heart failure: Secondary | ICD-10-CM

## 2021-09-24 LAB — BASIC METABOLIC PANEL
Anion gap: 5 (ref 5–15)
BUN: 27 mg/dL — ABNORMAL HIGH (ref 8–23)
CO2: 27 mmol/L (ref 22–32)
Calcium: 9.1 mg/dL (ref 8.9–10.3)
Chloride: 105 mmol/L (ref 98–111)
Creatinine, Ser: 2.56 mg/dL — ABNORMAL HIGH (ref 0.61–1.24)
GFR, Estimated: 27 mL/min — ABNORMAL LOW (ref >60)
Glucose, Bld: 102 mg/dL — ABNORMAL HIGH (ref 70–99)
Potassium: 4.7 mmol/L (ref 3.5–5.1)
Sodium: 137 mmol/L (ref 135–145)

## 2021-09-25 ENCOUNTER — Encounter: Payer: Self-pay | Admitting: Podiatry

## 2021-09-25 ENCOUNTER — Telehealth (HOSPITAL_COMMUNITY): Payer: Self-pay | Admitting: *Deleted

## 2021-09-25 MED ORDER — POTASSIUM CHLORIDE CRYS ER 20 MEQ PO TBCR: EXTENDED_RELEASE_TABLET | ORAL | 3 refills | Status: DC

## 2021-09-25 MED ORDER — FUROSEMIDE 20 MG PO TABS: ORAL_TABLET | ORAL | 3 refills | Status: DC

## 2021-09-25 NOTE — Progress Notes (Signed)
?  Subjective:  ?Patient ID: Gerald Nelson, male    DOB: 1951/05/15,  MRN: [id number] ? ?Chief Complaint  ?Patient presents with  ? Nail Problem  ?  Onychomycosis with chronic toenail changes and long nails and chronic ?foot changes ?  ? ? ?71 y.o. male presents with the above complaint. History confirmed with patient.  Also has dry itching skin on both feet.  The nails are thickened elongated and causing pain ? ?Objective:  ?Physical Exam: ?warm, good capillary refill, no trophic changes or ulcerative lesions, normal DP and PT pulses, normal sensory exam, and tinea pedis. ?Left Foot: dystrophic yellowed discolored nail plates with subungual debris ?Right Foot: dystrophic yellowed discolored nail plates with subungual debris ? ?Assessment:  ? ?1. Onychomycosis   ?2. Tinea pedis of both feet   ? ? ? ?Plan:  ?Patient was evaluated and treated and all questions answered. ? ?Discussed the etiology and treatment options for the condition in detail with the patient. Educated patient on the topical and oral treatment options for mycotic nails. Recommended debridement of the nails today. Sharp and mechanical debridement performed of all painful and mycotic nails today. Nails debrided in length and thickness using a nail nipper to level of comfort. Discussed treatment options including appropriate shoe gear. Follow up as needed for painful nails. ? ? ?Discussed the etiology and treatment options for tinea pedis.  Discussed topical and oral treatment.  Recommended topical treatment with 2% ketoconazole cream.  This was sent to the patient's pharmacy.  Also discussed appropriate foot hygiene, use of antifungal spray such as Tinactin in shoes, as well as cleaning her foot surfaces such as showers and bathroom floors with bleach. ? ? ?Return if symptoms worsen or fail to improve.  ? ?

## 2021-09-25 NOTE — Telephone Encounter (Signed)
-----   Message from [name], MD sent at 09/25/2021  3:33 PM EDT ----- ?Hold Lasix/KCl for 3 days, then decrease Lasix to 20 mg every other day with KCl.  ?

## 2021-09-25 NOTE — Telephone Encounter (Signed)
Pt is aware, he gets his meds from [facility] in a bubble pack so he is unsure which pills are which, detailed instructions sent to [facility] ?

## 2021-10-01 ENCOUNTER — Other Ambulatory Visit (HOSPITAL_COMMUNITY): Payer: Self-pay | Admitting: Cardiology

## 2021-10-01 DIAGNOSIS — I5022 Chronic systolic (congestive) heart failure: Secondary | ICD-10-CM | POA: Diagnosis not present

## 2021-10-01 DIAGNOSIS — I10 Essential (primary) hypertension: Secondary | ICD-10-CM | POA: Diagnosis not present

## 2021-10-01 DIAGNOSIS — J449 Chronic obstructive pulmonary disease, unspecified: Secondary | ICD-10-CM | POA: Diagnosis not present

## 2021-10-01 DIAGNOSIS — E785 Hyperlipidemia, unspecified: Secondary | ICD-10-CM | POA: Diagnosis not present

## 2021-10-20 ENCOUNTER — Ambulatory Visit (HOSPITAL_COMMUNITY)
Admission: RE | Admit: 2021-10-20 | Discharge: 2021-10-20 | Disposition: A | Discharge status: Home / Self Care | Payer: Medicare Other | Source: Ambulatory Visit | Attending: Cardiology | Admitting: Cardiology

## 2021-10-20 ENCOUNTER — Encounter (HOSPITAL_COMMUNITY): Payer: Self-pay

## 2021-10-20 VITALS — BP 142/98 | HR 71 | Wt 133.6 lb

## 2021-10-20 DIAGNOSIS — I5032 Chronic diastolic (congestive) heart failure: Secondary | ICD-10-CM

## 2021-10-20 DIAGNOSIS — Z8673 Personal history of transient ischemic attack (TIA), and cerebral infarction without residual deficits: Secondary | ICD-10-CM | POA: Diagnosis not present

## 2021-10-20 DIAGNOSIS — I2510 Atherosclerotic heart disease of native coronary artery without angina pectoris: Secondary | ICD-10-CM | POA: Insufficient documentation

## 2021-10-20 DIAGNOSIS — F17210 Nicotine dependence, cigarettes, uncomplicated: Secondary | ICD-10-CM | POA: Diagnosis not present

## 2021-10-20 DIAGNOSIS — I272 Pulmonary hypertension, unspecified: Secondary | ICD-10-CM | POA: Insufficient documentation

## 2021-10-20 DIAGNOSIS — N183 Chronic kidney disease, stage 3 unspecified: Secondary | ICD-10-CM | POA: Diagnosis not present

## 2021-10-20 DIAGNOSIS — Z79899 Other long term (current) drug therapy: Secondary | ICD-10-CM | POA: Insufficient documentation

## 2021-10-20 DIAGNOSIS — I340 Nonrheumatic mitral (valve) insufficiency: Secondary | ICD-10-CM | POA: Diagnosis not present

## 2021-10-20 DIAGNOSIS — I5042 Chronic combined systolic (congestive) and diastolic (congestive) heart failure: Secondary | ICD-10-CM | POA: Diagnosis not present

## 2021-10-20 DIAGNOSIS — F209 Schizophrenia, unspecified: Secondary | ICD-10-CM | POA: Diagnosis not present

## 2021-10-20 DIAGNOSIS — I252 Old myocardial infarction: Secondary | ICD-10-CM | POA: Diagnosis not present

## 2021-10-20 DIAGNOSIS — I130 Hypertensive heart and chronic kidney disease with heart failure and stage 1 through stage 4 chronic kidney disease, or unspecified chronic kidney disease: Secondary | ICD-10-CM | POA: Diagnosis not present

## 2021-10-20 DIAGNOSIS — J441 Chronic obstructive pulmonary disease with (acute) exacerbation: Secondary | ICD-10-CM | POA: Insufficient documentation

## 2021-10-20 LAB — COMPREHENSIVE METABOLIC PANEL
ALT: 16 U/L (ref 0–44)
AST: 25 U/L (ref 15–41)
Albumin: 3.6 g/dL (ref 3.5–5.0)
Alkaline Phosphatase: 70 U/L (ref 38–126)
Anion gap: 6 (ref 5–15)
BUN: 30 mg/dL — ABNORMAL HIGH (ref 8–23)
CO2: 26 mmol/L (ref 22–32)
Calcium: 9.3 mg/dL (ref 8.9–10.3)
Chloride: 106 mmol/L (ref 98–111)
Creatinine, Ser: 2.28 mg/dL — ABNORMAL HIGH (ref 0.61–1.24)
GFR, Estimated: 30 mL/min — ABNORMAL LOW (ref >60)
Glucose, Bld: 90 mg/dL (ref 70–99)
Potassium: 4.4 mmol/L (ref 3.5–5.1)
Sodium: 138 mmol/L (ref 135–145)
Total Bilirubin: 0.8 mg/dL (ref 0.3–1.2)
Total Protein: 6.6 g/dL (ref 6.5–8.1)

## 2021-10-20 LAB — LIPID PANEL
Cholesterol: 139 mg/dL (ref 0–200)
HDL: 80 mg/dL (ref >40)
LDL Cholesterol: 53 mg/dL (ref 0–99)
Total CHOL/HDL Ratio: 1.7 RATIO
Triglycerides: 30 mg/dL (ref <150)
VLDL: 6 mg/dL (ref 0–40)

## 2021-10-20 LAB — BRAIN NATRIURETIC PEPTIDE: B Natriuretic Peptide: 34.2 pg/mL (ref 0.0–100.0)

## 2021-10-20 NOTE — Progress Notes (Signed)
error 

## 2021-10-20 NOTE — Progress Notes (Signed)
?  ? ?Date:  10/20/2021  ? ?ID:  Gerald Nelson, DOB 30-Nov-1950, MRN [id number]   ?Provider location: [facility] ?Type of Visit: Established patient  ? ?PCP:  [name], MD  ?Cardiologist:  Dr. Aundra Dubin ? ?Chief Complaint: Chronic Diastolic Heart Failure  ?  ?History of Present Illness: ?Gerald Nelson is a 71 y.o. male who has a past medical history of chronic systolic CHF (EF 23% in July 2018), severe MR/TR, pulmonary HTN, CVA, CKD, schizophrenia, HTN and tobacco abuse.  ?  ?Mr. Kurek was admitted to [facility] 7/2-01/06/17 for newly diagnosed acute systolic CHF. Echo showed LVEF 15%, severe global hypokinesis, moderate LVH, coarse trabeculation of the LV apex with numerous false tendinae of the left ventricle, very stagnant blood flow at the LV apex with smoke but no obvious LV thrombus - Definity contrast was given, again, noting stagnant apical blood flow - this could suggest  recent thrombus and certainly high risk for apical thrombus  formation. Other findings include aortic sclerosis with mild AI, moderate to severe MR, moderate LAE, mild RAE, moderate to severe TR, moderate to severe pulmonary hypertension (RVSP 73 mmHg), dilated IVC, trivial posterior pericardial effusion.  ?  ?He was admitted again in 10/18. He diuresed 25 pounds on IV lasix. He refused cath. His clonidine and diltazem were stopped during this admission. Referred to paramedicine. Discharge weight 133 pounds.  ?  ?Echo 08/11/17 LVEF 15-20%, no MR noted.  ? ?Echo in 7/20 showed improvement in EF to 50-55%, moderate LVH, normal RV size and systolic function.  Echo in 9/21 showed EF 55% with moderate LVH, RV normal, IVC normal.  PYP scan in 11/21 was not suggestive of TTR cardiac amyloidosis.  ? ?Patient was admitted in 9/22 with AKI.  He was found to have COPD exacerbation.  V/Q scan was negative.  He had E coli bacteremia and there was also concern for aspiration PNA.  Creatinine peaked at 4.6 then improved.  He had been on  Lasix 20 mg daily prior to admission, this was stopped. Losartan and spironolactone were also stopped. He was sent home on Augmentin and prednisone for COPD exacerbation.  ? ?Echo in 10/22 showed EF 55-60%, normal RV, IVC normal.  ?  ?Seen in F/u 3/23 and endorsed NYHA class II-III symptoms. ReDs clip was elevated at 38%. He was started on Farxiga 10 mg daily and Lasix was increased to 20 mg daily. He had repeat labs 1 week after diuretic increase and found to have AKI w/ bump in SCr from 2.00>>2.56. Mg 4.7. He was instructed to hold lasix and KCl x 3 days then reduce lasix to 20 mg qod.  ? ?He returns back today for f/u. Unable to get f/u ReDs measurement (reading low quality despite multiple attempts). He does not look grossly fluid overloaded on exam. Notes slight improvement in symptoms, NYHA Class II. Wheezing noted on exam. Continues to smoke cigarettes and not interested in quitting. He is scheduled for Chest CT for lung cancer screening at [facility] pulmonary on 5/9. He denies chest pain. BP mildly elevated but hasnt taken mid day dose of his bidil yet. Reports full med compliance. Uses bubble packs.  ? ? ? ?Labs (6/19): K 3.8, creatinine 2.2 ?Labs (11/19): K 4.1, creatinine 1.95, LDL 54, HDl 54 ?Labs (12/19): K 4.6, creatinine 1.89 ?LabS (2/20): K 4.5, creatinine 1.68  ?Labs (4/20): K 4.5, creatinine 1.58 ?Labs (9/20): K 5.1, creatinine 2.02 ?Labs (10/20): LDL 67, K 4.8, creatinine  1.92 ?Labs (3/21): K 4.8, creatinine 1.97 ?Labs (9/22): K 4.3, creatinine 1.6, LDL 63 ?Labs (1/23): K 4.5, creatinine 2.11 ?Labs (09/03/21): K 4.0, SCr 2.00 ?Labs (09/24/21) K 4.7, Scr 2.56  ?  ?PMH: ?1. Chronic systolic CHF:  Cardiomyopathy of uncertain etiology, refused cath. He also refused ICD.  ?- Echo (7/18): Moderate LVH, severe FBSH, trabeculation at apex not meeting criteria for noncompaction, EF 15%, moderate to severe MR, low normal RV systolic function, moderate-severe TR.  ?- Echo (2/19): Moderate LVH, EF 15-20%, No  mitral regurgitation, normal RV size and systolic function, mild TR.  ?- Echo (7/20): EF 50-55%, moderate LVH, normal RV size and systolic function.  ?- Echo (9/21): EF 55%, moderate LVH, RV normal, IVC normal.  ?- PYP scan (11/21): No evidence for TTR cardiac amyloidosis.  ?- Echo (10/22): EF 55-60%, normal RV, IVC normal. ?2. H/o CVA ?3. Schizophrenia ?4. CKD stage 3 ?5. Mitral regurgitation: Suspect functional. Moderate-severe MR on 7/18 echo, minimal MR on 9/21 echo.  ?6. COPD: He is an active smoker.  ?- PFTs (12/18) suggestive of severe COPD.  ?7. H/o HTN ?8. ? apical mural thrombus: Not seen on more recent echoes ?9. CAD: 1/19 CT chest showed coronary calcification.  ? ?Current Outpatient Medications  ?Medication Sig Dispense Refill  ? albuterol (PROVENTIL HFA;VENTOLIN HFA) 108 (90 Base) MCG/ACT inhaler Inhale 1-2 puffs into the lungs every 6 (six) hours as needed for wheezing or shortness of breath.    ? aspirin (ASPIRIN LOW DOSE) 81 MG EC tablet TAKE ONE TABLET BY MOUTH ONCE DAILY (MORNING) 90 tablet 3  ? atorvastatin (LIPITOR) 40 MG tablet TAKE ONE TABLET BY MOUTH EVERY DAY IN THE EVENING 30 tablet 6  ? bisoprolol (ZEBETA) 10 MG tablet TAKE 1 TABLET (10 MG TOTAL) BY MOUTH DAILY. (MORNING) 90 tablet 3  ? dapagliflozin propanediol (FARXIGA) 10 MG TABS tablet Take 1 tablet (10 mg total) by mouth daily before breakfast. 90 tablet 3  ? Fluticasone-Umeclidin-Vilant (TRELEGY ELLIPTA) 200-62.5-25 MCG/ACT AEPB Inhale 1 puff into the lungs daily. 1 each 11  ? furosemide (LASIX) 20 MG tablet Hold for 3 days then restart at 20 mg every other day 45 tablet 3  ? haloperidol (HALDOL) 5 MG tablet Take 5 mg by mouth 2 (two) times daily.    ? isosorbide-hydrALAZINE (BIDIL) 20-37.5 MG tablet TAKE 1 TABLET BY MOUTH 3 (THREE) TIMES DAILY (AM+NOON+BEDTIME) 180 tablet 6  ? potassium chloride SA (KLOR-CON M20) 20 MEQ tablet Hold for 3 days then restart at 1 tab every other day 90 tablet 3  ? ?No current facility-administered  medications for this encounter.  ? ? ?Allergies:   Sulfa antibiotics  ? ?Social History:  The patient  reports that he has been smoking cigarettes. He started smoking about 49 years ago. He has a 47.00 pack-year smoking history. He has never used smokeless tobacco. He reports that he does not drink alcohol and does not use drugs.  ? ?Family History:  The patient's family history includes Hypertension in his father and mother.  ? ?ROS:  Please see the history of present illness.   All other systems are personally reviewed and negative.  ? ?Vitals:  ?BP (!) 142/98   Pulse 71   Wt 60.6 kg (133 lb 9.6 oz)   SpO2 95%   BMI 19.17 kg/m?  ? ?PHYSICAL EXAM: ?General:  AAM looks much older than actual age. No respiratory difficulty ?HEENT: normal ?Neck: supple. no JVD. Carotids 2+ bilat; no bruits. No lymphadenopathy  or thyromegaly appreciated. ?Cor: PMI nondisplaced. Regular rate & rhythm. No rubs, gallops or murmurs. ?Lungs: + expiratory wheezing bilaterally. No crackles  ?Abdomen: soft, nontender, nondistended. No hepatosplenomegaly. No bruits or masses. Good bowel sounds. ?Extremities: no cyanosis, clubbing, rash, edema ?Neuro: alert & oriented x 3, cranial nerves grossly intact. moves all 4 extremities w/o difficulty. Affect pleasant. ? ? ?Recent Labs: ?03/20/2021: TSH 0.329 ?03/26/2021: Magnesium 1.6 ?07/23/2021: ALT 20; Hemoglobin 13.9; Platelets 197 ?09/03/2021: B Natriuretic Peptide 85.0 ?09/24/2021: BUN 27; Creatinine, Ser 2.56; Potassium 4.7; Sodium 137  ?Personally reviewed  ? ?Wt Readings from Last 3 Encounters:  ?10/20/21 60.6 kg (133 lb 9.6 oz)  ?09/22/21 58.1 kg (128 lb)  ?09/03/21 62.1 kg (136 lb 12.8 oz)  ?  ? ? ?ASSESSMENT AND PLAN: ? ?1. Chronic systolic => diastolic CHF: Echo 0/7622 EF 15% with moderate-severe MR. Echo 08/11/17 LVEF 15-20%, no MR noted. Cardiomyopathy of uncertain etiology, he has refused cath in the past.  Echo in 7/20 showed improvement in EF to 50-55%.  Echo in 9/21 showed EF stable 55%,  moderate LVH, normal RV.  PYP scan not suggestive of TTR cardiac amyloidosis.  Echo in 10/22 with stable EF 55-60%.  NYHA class II symptoms.  I think that his symptoms are likely driven by COPD. Grossly euvolemic on

## 2021-10-20 NOTE — Patient Instructions (Signed)
It was great to see you today! ?No medication changes are needed at this time. ? ?Labs today ?We will only contact you if something comes back abnormal or we need to make some changes. ?Otherwise no news is good news! ? ?Your physician recommends that you schedule a follow-up appointment in: 3-4 months with Dr McLean ? ? ?Do the following things EVERYDAY: ?Weigh yourself in the morning before breakfast. Write it down and keep it in a log. ?Take your medicines as prescribed ?Eat low salt foods--Limit salt (sodium) to 2000 mg per day.  ?Stay as active as you can everyday ?Limit all fluids for the day to less than 2 liters ? ?At the [facility], you and your health needs are our priority. As part of our continuing mission to provide you with exceptional heart care, we have created designated Provider Care Teams. These Care Teams include your primary Cardiologist (physician) and Advanced Practice Providers (APPs- Physician Assistants and Nurse Practitioners) who all work together to provide you with the care you need, when you need it.  ? ?You may see any of the following providers on your designated Care Team at your next follow up: ?Dr Daniel Bensimhon ?Dr Dalton McLean ?Amy Clegg, NP ?Brittainy Simmons, PA ?Jessica Milford,NP ?Lindsay Finch, PA ?Lauren Kemp, PharmD ? ? ?Please be sure to bring in all your medications bottles to every appointment.  ? ?If you have any questions or concerns before your next appointment please send us a message through mychart or call our office at [phone].   ? ?TO LEAVE A MESSAGE FOR THE NURSE SELECT OPTION 2, PLEASE LEAVE A MESSAGE INCLUDING: ?YOUR NAME ?DATE OF BIRTH ?CALL BACK NUMBER ?REASON FOR CALL**this is important as we prioritize the call backs ? ?YOU WILL RECEIVE A CALL BACK THE SAME DAY AS LONG AS YOU CALL BEFORE 4:00 PM ? ? ?

## 2021-10-30 ENCOUNTER — Other Ambulatory Visit (HOSPITAL_COMMUNITY): Payer: Self-pay | Admitting: Cardiology

## 2021-11-06 ENCOUNTER — Ambulatory Visit: Payer: Medicare Other

## 2021-11-10 ENCOUNTER — Ambulatory Visit (INDEPENDENT_AMBULATORY_CARE_PROVIDER_SITE_OTHER)
Admission: RE | Admit: 2021-11-10 | Discharge: 2021-11-10 | Disposition: A | Discharge status: Home / Self Care | Payer: Medicare Other | Source: Ambulatory Visit | Attending: Acute Care | Admitting: Acute Care

## 2021-11-10 DIAGNOSIS — F17210 Nicotine dependence, cigarettes, uncomplicated: Secondary | ICD-10-CM | POA: Diagnosis not present

## 2021-11-10 DIAGNOSIS — Z87891 Personal history of nicotine dependence: Secondary | ICD-10-CM | POA: Diagnosis not present

## 2021-11-11 ENCOUNTER — Other Ambulatory Visit (HOSPITAL_COMMUNITY): Payer: Self-pay | Admitting: Cardiology

## 2021-11-13 ENCOUNTER — Other Ambulatory Visit: Payer: Self-pay

## 2021-11-13 DIAGNOSIS — Z87891 Personal history of nicotine dependence: Secondary | ICD-10-CM

## 2021-11-13 DIAGNOSIS — F17210 Nicotine dependence, cigarettes, uncomplicated: Secondary | ICD-10-CM

## 2021-11-13 DIAGNOSIS — Z122 Encounter for screening for malignant neoplasm of respiratory organs: Secondary | ICD-10-CM

## 2021-11-17 ENCOUNTER — Telehealth (HOSPITAL_COMMUNITY): Payer: Self-pay

## 2021-11-17 NOTE — Telephone Encounter (Signed)
Called pt in regards to PR, pt stated he already take too many dayds off of work. Not able to participate at this time. ?  ?Closed referral ?

## 2021-11-23 NOTE — Progress Notes (Unsigned)
Synopsis: Referred for pulmonary hypertension by [name], MD  Subjective:   PATIENT ID: Gerald Nelson GENDER: male DOB: 07/09/1950, MRN: [id number]  No chief complaint on file.   71yM with history of CHF, COPD, CKD, smoking 1ppd active  He says he has DOE to 6 blocks. Is gradually worsening. He does have a bit of a productive cough. No fever. He says he is taking trelegy 1 puff once daily.   He has needed several courses of steroids this year for COPD.   Interval HPI Last seen by me 05/2021 at which point started on high dose trelegy, referred to pulm rehab and lung cancer screening, nicotine patches for smoking cessation. No change in DOE since last visit. He doesn't feel like he has much in way of cough or chest congestion but he clearly sounds rhonchorous throughout our visit.  Seen by CHF 3/2 at which point increased lasix to 20 mg daily, added farxiga.  Still smoking less than a pack a day or so.  Otherwise pertinent review of systems is negative.  Past Medical History:  Diagnosis Date   Apical mural thrombus    a. question of apical thrombus on echo 01/2017, pt left AMA, not felt to be anticoag candidate with noncompliance.   Arthritis    CHF (congestive heart failure) (HCC)    Chronic systolic CHF (congestive heart failure) ([place])    a. pt refused cath. EF 15% 01/2017.   CKD (chronic kidney disease), stage III (HCC)    COPD (chronic obstructive pulmonary disease) (HCC)    Hernia, hiatal    Hyperlipidemia    Hypertension    Hyponatremia    Mitral regurgitation    a. mod-severe by echo 01/2017.   Pulmonary hypertension (HCC)    Schizophrenia ([place])    Tobacco abuse      Family History  Problem Relation Age of Onset   Hypertension Mother    Hypertension Father      Past Surgical History:  Procedure Laterality Date   PENILE PROSTHESIS IMPLANT      Social History   Socioeconomic History   Marital status: Single    Spouse name: Not on file   Number of  children: Not on file   Years of education: Not on file   Highest education level: Not on file  Occupational History   Occupation: Systems analyst  Tobacco Use   Smoking status: Every Day    Packs/day: 1.00    Years: 47.00    Pack years: 47.00    Types: Cigarettes    Start date: 1974   Smokeless tobacco: Never   Tobacco comments:    requests patch         Pt smoke 1 pack a day//06/02/21  Vaping Use   Vaping Use: Never used  Substance and Sexual Activity   Alcohol use: No   Drug use: No   Sexual activity: Not Currently    Birth control/protection: None  Other Topics Concern   Not on file  Social History Narrative   Not on file   Social Determinants of Health   Financial Resource Strain: Not on file  Food Insecurity: Food Insecurity Present   Worried About [place] in the Last Year: Sometimes true   Ran Out of Food in the Last Year: Sometimes true  Transportation Needs: No Transportation Needs   Lack of Transportation (Medical): No   Lack of Transportation (Non-Medical): No  Physical Activity: Not on file  Stress: Not  on file  Social Connections: Not on file  Intimate Partner Violence: Not on file     Allergies  Allergen Reactions   Sulfa Antibiotics Nausea Only     Outpatient Medications Prior to Visit  Medication Sig Dispense Refill   albuterol (PROVENTIL HFA;VENTOLIN HFA) 108 (90 Base) MCG/ACT inhaler Inhale 1-2 puffs into the lungs every 6 (six) hours as needed for wheezing or shortness of breath.     aspirin (ASPIRIN LOW DOSE) 81 MG EC tablet TAKE ONE TABLET BY MOUTH ONCE DAILY (MORNING) 90 tablet 3   atorvastatin (LIPITOR) 40 MG tablet TAKE ONE TABLET BY MOUTH EVERY DAY IN THE EVENING 30 tablet 6   bisoprolol (ZEBETA) 10 MG tablet TAKE 1 TABLET (10 MG TOTAL) BY MOUTH DAILY. (MORNING) 90 tablet 3   dapagliflozin propanediol (FARXIGA) 10 MG TABS tablet Take 1 tablet (10 mg total) by mouth daily before breakfast. 90 tablet 3    Fluticasone-Umeclidin-Vilant (TRELEGY ELLIPTA) 200-62.5-25 MCG/ACT AEPB Inhale 1 puff into the lungs daily. 1 each 11   furosemide (LASIX) 20 MG tablet Hold for 3 days then restart at 20 mg every other day 45 tablet 3   haloperidol (HALDOL) 5 MG tablet Take 5 mg by mouth 2 (two) times daily.     isosorbide-hydrALAZINE (BIDIL) 20-37.5 MG tablet TAKE 1 TABLET BY MOUTH 3 (THREE) TIMES DAILY (AM+NOON+BEDTIME) 180 tablet 6   potassium chloride SA (KLOR-CON M) 20 MEQ tablet Take  1 TAB EVERY OTHER DAY (SUN,TUES,THURS,SAT) 45 tablet 3   No facility-administered medications prior to visit.       Objective:   Physical Exam:  General appearance: 71 y.o., male, NAD, conversant, chronically ill appearing Eyes: anicteric sclerae; PERRL, tracking appropriately HENT: NCAT; MMM Neck: Trachea midline; no lymphadenopathy, no JVD Lungs: diminished bilatearlly,  no crackles, no wheeze, with normal respiratory effort CV: RRR, no murmur  Abdomen: Soft, non-tender; non-distended, BS present  Extremities: No peripheral edema, warm Skin: Normal turgor and texture; no rash Psych: Appropriate affect Neuro: Alert and oriented to person and place, no focal deficit     There were no vitals filed for this visit.     on RA BMI Readings from Last 3 Encounters:  10/20/21 19.17 kg/m  09/22/21 18.37 kg/m  09/03/21 19.63 kg/m   Wt Readings from Last 3 Encounters:  10/20/21 133 lb 9.6 oz (60.6 kg)  09/22/21 128 lb (58.1 kg)  09/03/21 136 lb 12.8 oz (62.1 kg)     CBC    Component Value Date/Time   WBC 8.0 07/23/2021 1356   RBC 4.90 07/23/2021 1356   HGB 13.9 07/23/2021 1356   HCT 42.8 07/23/2021 1356   PLT 197 07/23/2021 1356   MCV 87.3 07/23/2021 1356   MCH 28.4 07/23/2021 1356   MCHC 32.5 07/23/2021 1356   RDW 13.2 07/23/2021 1356   LYMPHSABS 1.9 07/23/2021 1356   MONOABS 1.0 07/23/2021 1356   EOSABS 0.1 07/23/2021 1356   BASOSABS 0.1 07/23/2021 1356   Eos 0-100  Chest Imaging:  CXR  08/17/21 reviewed by me with hyperinflation  CT Chest 11/06/20 reviewed by me remarkable for emphysema, marked bronchial wall thickening, patulous esophagus, scattered stable nodules  LDCT Chest 11/2021 reviewed by me stable pulmonary nodules  Pulmonary Functions Testing Results:    Latest Ref Rng & Units 06/22/2017   10:27 AM  PFT Results  FVC-Pre L 1.77    FVC-Predicted Pre % 50    FVC-Post L 2.18    FVC-Predicted Post % 62  Pre FEV1/FVC % % 49    Post FEV1/FCV % % 51    FEV1-Pre L 0.88    FEV1-Predicted Pre % 32    FEV1-Post L 1.12    DLCO uncorrected ml/min/mmHg 12.22    DLCO UNC% % 43    DLCO corrected ml/min/mmHg 12.55    DLCO COR %Predicted % 44    DLVA Predicted % 69    TLC L 6.83    TLC % Predicted % 106    RV % Predicted % 223     Severe obstruction and diffusing capacity impairment, +BD response, air trapping    Echocardiogram:   Tte 04/24/21: 1. Left ventricular ejection fraction, by estimation, is 55 to 60%. Left  ventricular ejection fraction by 3D volume is 52 %. The left ventricle has  normal function. The left ventricle has no regional wall motion  abnormalities. Left ventricular diastolic   parameters are consistent with Grade I diastolic dysfunction (impaired  relaxation).   2. Right ventricular systolic function is normal. The right ventricular  size is normal. There is normal pulmonary artery systolic pressure.   3. The mitral valve is normal in structure. No evidence of mitral valve  regurgitation. No evidence of mitral stenosis.   4. The aortic valve is normal in structure. Aortic valve regurgitation is  not visualized. Mild aortic valve sclerosis is present, with no evidence  of aortic valve stenosis.   5. The inferior vena cava is normal in size with greater than 50%  respiratory variability, suggesting right atrial pressure of 3 mmHg.     Assessment & Plan:   # COPD gold functional group D: Post BD FEV1 41%, good bronchodilator response,  marked bronchial wall thickening on CT.  # Pulmonary nodules # Smoking  Plan: - continue high dose trelegy 200 1 puff once daily, rinse mouth after use - start flutter valve 10 puffs daily after trelegy - pulmonary rehab referral placed last visit, awaiting contact to start program - chantix prescribed today - continue lung cancer screening, due 11/2021     Maryjane Hurter, MD [facility] 11/23/2021 12:45 PM

## 2021-11-24 ENCOUNTER — Ambulatory Visit: Payer: Medicare Other | Admitting: Student

## 2021-11-30 IMAGING — DX DG CHEST 2V
2 series · 2 of 2 positions shown · non-contrast
Comparison: 03/31/2019

CLINICAL DATA: Shortness of breath

EXAM:
CHEST - 2 VIEW

[w chest pa]
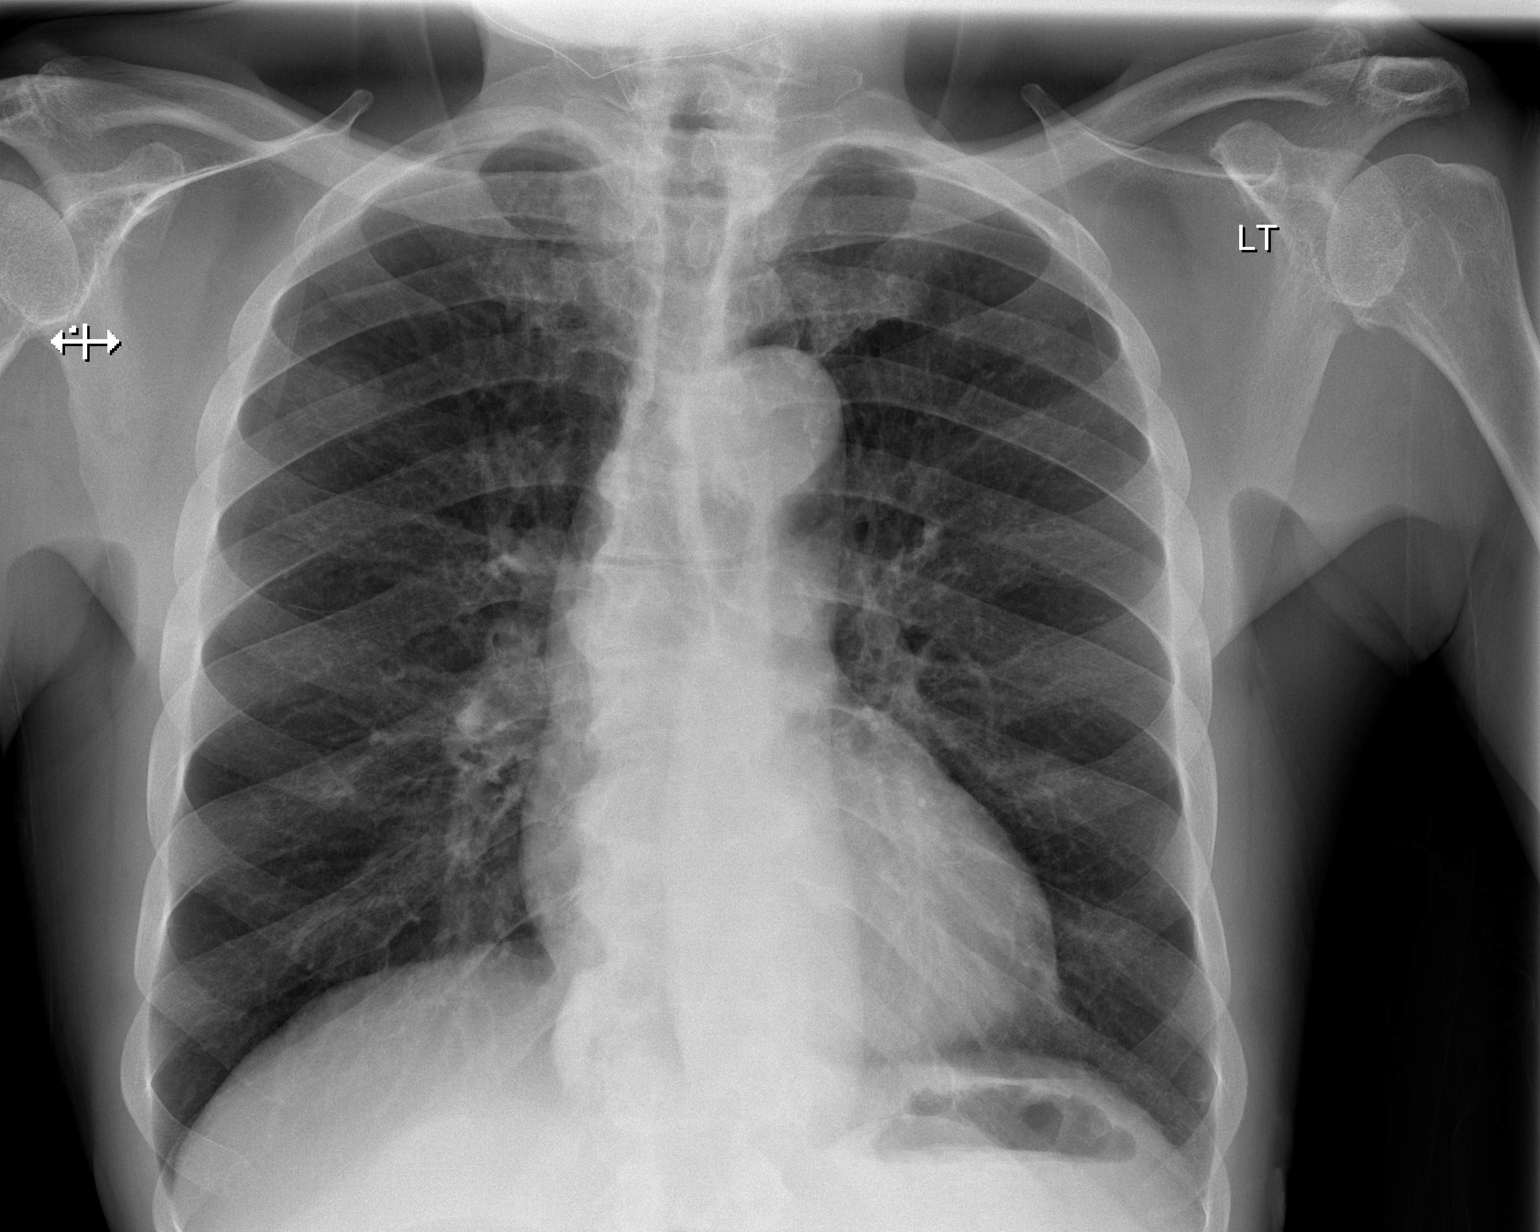

[w chest lat]
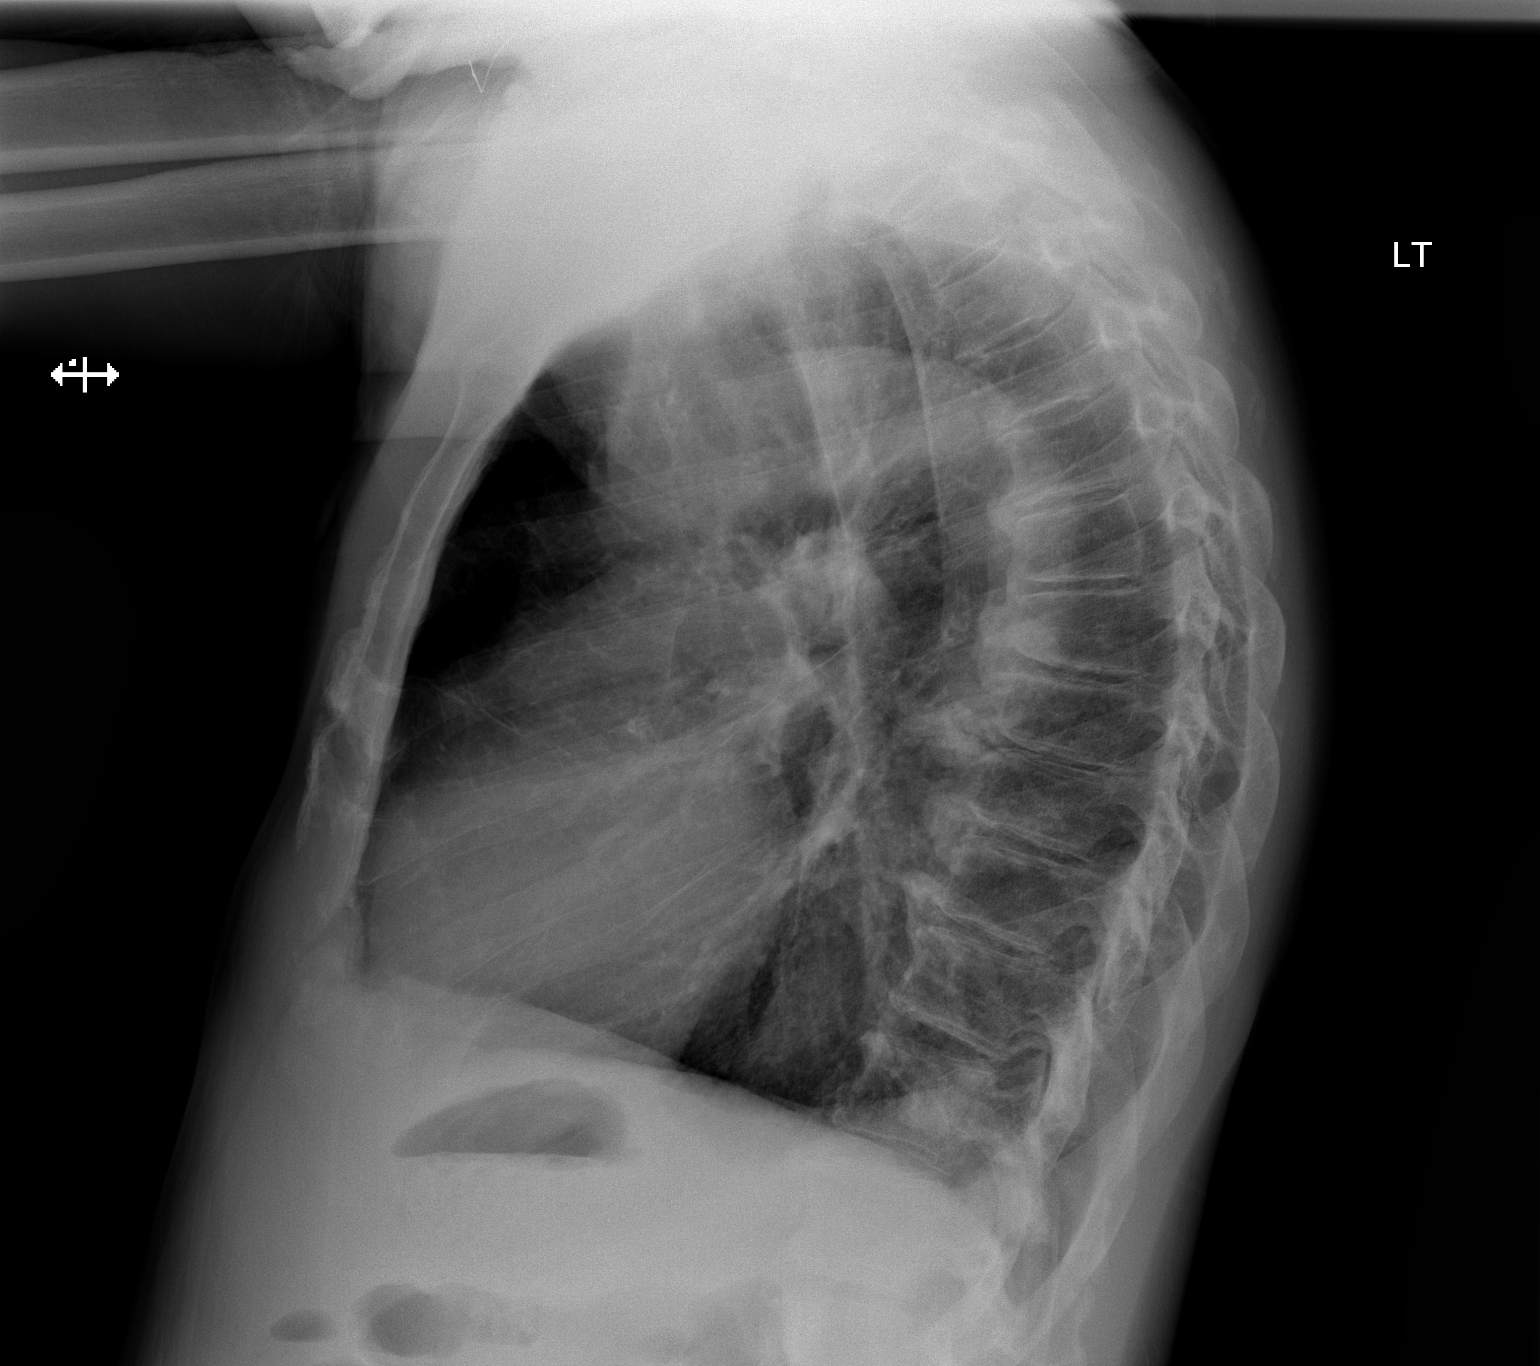

[2 of 2 positions shown; findings below may reference images not displayed]

FINDINGS: Hyperinflated lungs with emphysematous disease. No acute
consolidation or effusion. Stable cardiomediastinal silhouette. No
pneumothorax. Degenerative changes of the spine.
IMPRESSION: No active cardiopulmonary disease. Hyperinflation with emphysematous
disease.

## 2021-12-01 ENCOUNTER — Ambulatory Visit: Payer: Medicare Other | Admitting: Pulmonary Disease

## 2021-12-16 DIAGNOSIS — F17210 Nicotine dependence, cigarettes, uncomplicated: Secondary | ICD-10-CM | POA: Diagnosis not present

## 2021-12-16 DIAGNOSIS — J449 Chronic obstructive pulmonary disease, unspecified: Secondary | ICD-10-CM | POA: Diagnosis not present

## 2021-12-16 DIAGNOSIS — M7022 Olecranon bursitis, left elbow: Secondary | ICD-10-CM | POA: Diagnosis not present

## 2021-12-29 DIAGNOSIS — I509 Heart failure, unspecified: Secondary | ICD-10-CM | POA: Diagnosis not present

## 2021-12-29 DIAGNOSIS — I700 Atherosclerosis of aorta: Secondary | ICD-10-CM | POA: Diagnosis not present

## 2021-12-29 DIAGNOSIS — E46 Unspecified protein-calorie malnutrition: Secondary | ICD-10-CM | POA: Diagnosis not present

## 2021-12-29 DIAGNOSIS — I10 Essential (primary) hypertension: Secondary | ICD-10-CM | POA: Diagnosis not present

## 2021-12-29 DIAGNOSIS — N183 Chronic kidney disease, stage 3 unspecified: Secondary | ICD-10-CM | POA: Diagnosis not present

## 2021-12-29 DIAGNOSIS — I272 Pulmonary hypertension, unspecified: Secondary | ICD-10-CM | POA: Diagnosis not present

## 2021-12-29 DIAGNOSIS — I679 Cerebrovascular disease, unspecified: Secondary | ICD-10-CM | POA: Diagnosis not present

## 2021-12-29 DIAGNOSIS — J449 Chronic obstructive pulmonary disease, unspecified: Secondary | ICD-10-CM | POA: Diagnosis not present

## 2022-01-14 DIAGNOSIS — I129 Hypertensive chronic kidney disease with stage 1 through stage 4 chronic kidney disease, or unspecified chronic kidney disease: Secondary | ICD-10-CM | POA: Diagnosis not present

## 2022-01-14 DIAGNOSIS — E785 Hyperlipidemia, unspecified: Secondary | ICD-10-CM | POA: Diagnosis not present

## 2022-01-14 DIAGNOSIS — N189 Chronic kidney disease, unspecified: Secondary | ICD-10-CM | POA: Diagnosis not present

## 2022-01-14 DIAGNOSIS — I509 Heart failure, unspecified: Secondary | ICD-10-CM | POA: Diagnosis not present

## 2022-01-14 DIAGNOSIS — J449 Chronic obstructive pulmonary disease, unspecified: Secondary | ICD-10-CM | POA: Diagnosis not present

## 2022-01-14 DIAGNOSIS — N2581 Secondary hyperparathyroidism of renal origin: Secondary | ICD-10-CM | POA: Diagnosis not present

## 2022-01-14 DIAGNOSIS — D631 Anemia in chronic kidney disease: Secondary | ICD-10-CM | POA: Diagnosis not present

## 2022-01-14 DIAGNOSIS — N184 Chronic kidney disease, stage 4 (severe): Secondary | ICD-10-CM | POA: Diagnosis not present

## 2022-01-20 NOTE — Progress Notes (Deleted)
Synopsis: Referred for pulmonary hypertension by [name], MD  Subjective:   PATIENT ID: Gerald Nelson GENDER: male DOB: [birth year], MRN: [id number]  No chief complaint on file.   64yM with history of CHF, COPD, CKD, smoking 1ppd active  He says he has DOE to 6 blocks. Is gradually worsening. He does have a bit of a productive cough. No fever. He says he is taking trelegy 1 puff once daily.   He has needed several courses of steroids this year for COPD.   Interval HPI Started on flutter and chantix last visit  Otherwise pertinent review of systems is negative.  Past Medical History:  Diagnosis Date   Apical mural thrombus    a. question of apical thrombus on echo 01/2017, pt left AMA, not felt to be anticoag candidate with noncompliance.   Arthritis    CHF (congestive heart failure) (HCC)    Chronic systolic CHF (congestive heart failure) ([place])    a. pt refused cath. EF 15% 01/2017.   CKD (chronic kidney disease), stage III (HCC)    COPD (chronic obstructive pulmonary disease) (HCC)    Hernia, hiatal    Hyperlipidemia    Hypertension    Hyponatremia    Mitral regurgitation    a. mod-severe by echo 01/2017.   Pulmonary hypertension (HCC)    Schizophrenia ([place])    Tobacco abuse      Family History  Problem Relation Age of Onset   Hypertension Mother    Hypertension Father      Past Surgical History:  Procedure Laterality Date   PENILE PROSTHESIS IMPLANT      Social History   Socioeconomic History   Marital status: Single    Spouse name: Not on file   Number of children: Not on file   Years of education: Not on file   Highest education level: Not on file  Occupational History   Occupation: Systems analyst  Tobacco Use   Smoking status: Every Day    Packs/day: 1.00    Years: 47.00    Total pack years: 47.00    Types: Cigarettes    Start date: 1974   Smokeless tobacco: Never   Tobacco comments:    requests patch         Pt smoke 1 pack a  day//06/02/21  Vaping Use   Vaping Use: Never used  Substance and Sexual Activity   Alcohol use: No   Drug use: No   Sexual activity: Not Currently    Birth control/protection: None  Other Topics Concern   Not on file  Social History Narrative   Not on file   Social Determinants of Health   Financial Resource Strain: Not on file  Food Insecurity: Food Insecurity Present (04/03/2021)   Hunger Vital Sign    Worried About Running Out of Food in the Last Year: Sometimes true    Ran Out of Food in the Last Year: Sometimes true  Transportation Needs: No Transportation Needs (04/03/2021)   PRAPARE - Hydrologist (Medical): No    Lack of Transportation (Non-Medical): No  Physical Activity: Not on file  Stress: Not on file  Social Connections: Not on file  Intimate Partner Violence: Not on file     Allergies  Allergen Reactions   Sulfa Antibiotics Nausea Only     Outpatient Medications Prior to Visit  Medication Sig Dispense Refill   albuterol (PROVENTIL HFA;VENTOLIN HFA) 108 (90 Base) MCG/ACT inhaler Inhale 1-2 puffs into  the lungs every 6 (six) hours as needed for wheezing or shortness of breath.     aspirin (ASPIRIN LOW DOSE) 81 MG EC tablet TAKE ONE TABLET BY MOUTH ONCE DAILY (MORNING) 90 tablet 3   atorvastatin (LIPITOR) 40 MG tablet TAKE ONE TABLET BY MOUTH EVERY DAY IN THE EVENING 30 tablet 6   bisoprolol (ZEBETA) 10 MG tablet TAKE 1 TABLET (10 MG TOTAL) BY MOUTH DAILY. (MORNING) 90 tablet 3   dapagliflozin propanediol (FARXIGA) 10 MG TABS tablet Take 1 tablet (10 mg total) by mouth daily before breakfast. 90 tablet 3   Fluticasone-Umeclidin-Vilant (TRELEGY ELLIPTA) 200-62.5-25 MCG/ACT AEPB Inhale 1 puff into the lungs daily. 1 each 11   furosemide (LASIX) 20 MG tablet Hold for 3 days then restart at 20 mg every other day 45 tablet 3   haloperidol (HALDOL) 5 MG tablet Take 5 mg by mouth 2 (two) times daily.     isosorbide-hydrALAZINE (BIDIL)  20-37.5 MG tablet TAKE 1 TABLET BY MOUTH 3 (THREE) TIMES DAILY (AM+NOON+BEDTIME) 180 tablet 6   potassium chloride SA (KLOR-CON M) 20 MEQ tablet Take  1 TAB EVERY OTHER DAY (SUN,TUES,THURS,SAT) 45 tablet 3   No facility-administered medications prior to visit.       Objective:   Physical Exam:  General appearance: 71 y.o., male, NAD, conversant, chronically ill appearing Eyes: anicteric sclerae; PERRL, tracking appropriately HENT: NCAT; MMM Neck: Trachea midline; no lymphadenopathy, no JVD Lungs: diminished bilatearlly,  no crackles, no wheeze, with normal respiratory effort CV: RRR, no murmur  Abdomen: Soft, non-tender; non-distended, BS present  Extremities: No peripheral edema, warm Skin: Normal turgor and texture; no rash Psych: Appropriate affect Neuro: Alert and oriented to person and place, no focal deficit     There were no vitals filed for this visit.     on RA BMI Readings from Last 3 Encounters:  10/20/21 19.17 kg/m  09/22/21 18.37 kg/m  09/03/21 19.63 kg/m   Wt Readings from Last 3 Encounters:  10/20/21 133 lb 9.6 oz (60.6 kg)  09/22/21 128 lb (58.1 kg)  09/03/21 136 lb 12.8 oz (62.1 kg)     CBC    Component Value Date/Time   WBC 8.0 07/23/2021 1356   RBC 4.90 07/23/2021 1356   HGB 13.9 07/23/2021 1356   HCT 42.8 07/23/2021 1356   PLT 197 07/23/2021 1356   MCV 87.3 07/23/2021 1356   MCH 28.4 07/23/2021 1356   MCHC 32.5 07/23/2021 1356   RDW 13.2 07/23/2021 1356   LYMPHSABS 1.9 07/23/2021 1356   MONOABS 1.0 07/23/2021 1356   EOSABS 0.1 07/23/2021 1356   BASOSABS 0.1 07/23/2021 1356    Chest Imaging:  CXR 08/17/21 reviewed by me with hyperinflation  CT Chest 11/06/20 reviewed by me remarkable for emphysema, marked bronchial wall thickening, patulous esophagus, scattered stable nodules  CT Chest 11/2021 reviewed by me with stable nodules, emphysema, small hiatal hernia with esophageal air fluid level - consider dysmotility  Pulmonary  Functions Testing Results:    Latest Ref Rng & Units 06/22/2017   10:27 AM  PFT Results  FVC-Pre L 1.77   FVC-Predicted Pre % 50   FVC-Post L 2.18   FVC-Predicted Post % 62   Pre FEV1/FVC % % 49   Post FEV1/FCV % % 51   FEV1-Pre L 0.88   FEV1-Predicted Pre % 32   FEV1-Post L 1.12   DLCO uncorrected ml/min/mmHg 12.22   DLCO UNC% % 43   DLCO corrected ml/min/mmHg 12.55   DLCO COR %  Predicted % 44   DLVA Predicted % 69   TLC L 6.83   TLC % Predicted % 106   RV % Predicted % 223    Severe obstruction and diffusing capacity impairment, +BD response, air trapping    Echocardiogram:   Tte 04/24/21: 1. Left ventricular ejection fraction, by estimation, is 55 to 60%. Left  ventricular ejection fraction by 3D volume is 52 %. The left ventricle has  normal function. The left ventricle has no regional wall motion  abnormalities. Left ventricular diastolic   parameters are consistent with Grade I diastolic dysfunction (impaired  relaxation).   2. Right ventricular systolic function is normal. The right ventricular  size is normal. There is normal pulmonary artery systolic pressure.   3. The mitral valve is normal in structure. No evidence of mitral valve  regurgitation. No evidence of mitral stenosis.   4. The aortic valve is normal in structure. Aortic valve regurgitation is  not visualized. Mild aortic valve sclerosis is present, with no evidence  of aortic valve stenosis.   5. The inferior vena cava is normal in size with greater than 50%  respiratory variability, suggesting right atrial pressure of 3 mmHg.     Assessment & Plan:   # COPD gold functional group D: Post BD FEV1 41%, good bronchodilator response, marked bronchial wall thickening on CT.  # Pulmonary nodules # Smoking  Plan: - continue high dose trelegy 200 1 puff once daily, rinse mouth after use - start flutter valve 10 puffs daily after trelegy - pulmonary rehab referral placed last visit, awaiting  contact to start program - chantix prescribed today - continue lung cancer screening, due 11/2021     Maryjane Hurter, MD [facility] 01/20/2022 1:08 PM

## 2022-01-21 ENCOUNTER — Ambulatory Visit: Payer: Medicare Other | Admitting: Student

## 2022-01-28 ENCOUNTER — Encounter (HOSPITAL_COMMUNITY): Payer: Medicare Other | Admitting: Cardiology

## 2022-01-28 ENCOUNTER — Other Ambulatory Visit (HOSPITAL_COMMUNITY): Payer: Self-pay | Admitting: Cardiology

## 2022-02-23 ENCOUNTER — Other Ambulatory Visit: Payer: Self-pay | Admitting: Podiatry

## 2022-02-24 DIAGNOSIS — I10 Essential (primary) hypertension: Secondary | ICD-10-CM | POA: Diagnosis not present

## 2022-02-24 DIAGNOSIS — I5022 Chronic systolic (congestive) heart failure: Secondary | ICD-10-CM | POA: Diagnosis not present

## 2022-02-24 DIAGNOSIS — N183 Chronic kidney disease, stage 3 unspecified: Secondary | ICD-10-CM | POA: Diagnosis not present

## 2022-02-24 DIAGNOSIS — K219 Gastro-esophageal reflux disease without esophagitis: Secondary | ICD-10-CM | POA: Diagnosis not present

## 2022-02-24 DIAGNOSIS — E785 Hyperlipidemia, unspecified: Secondary | ICD-10-CM | POA: Diagnosis not present

## 2022-02-24 DIAGNOSIS — J449 Chronic obstructive pulmonary disease, unspecified: Secondary | ICD-10-CM | POA: Diagnosis not present

## 2022-03-18 ENCOUNTER — Ambulatory Visit: Payer: Medicare Other | Admitting: Primary Care

## 2022-03-18 ENCOUNTER — Ambulatory Visit: Payer: Medicare Other | Admitting: Student

## 2022-04-13 ENCOUNTER — Other Ambulatory Visit (HOSPITAL_COMMUNITY): Payer: Self-pay | Admitting: Cardiology

## 2022-04-15 DIAGNOSIS — I700 Atherosclerosis of aorta: Secondary | ICD-10-CM | POA: Diagnosis not present

## 2022-04-15 DIAGNOSIS — I272 Pulmonary hypertension, unspecified: Secondary | ICD-10-CM | POA: Diagnosis not present

## 2022-04-15 DIAGNOSIS — I10 Essential (primary) hypertension: Secondary | ICD-10-CM | POA: Diagnosis not present

## 2022-04-15 DIAGNOSIS — J441 Chronic obstructive pulmonary disease with (acute) exacerbation: Secondary | ICD-10-CM | POA: Diagnosis not present

## 2022-04-15 DIAGNOSIS — I5022 Chronic systolic (congestive) heart failure: Secondary | ICD-10-CM | POA: Diagnosis not present

## 2022-04-15 DIAGNOSIS — N184 Chronic kidney disease, stage 4 (severe): Secondary | ICD-10-CM | POA: Diagnosis not present

## 2022-04-15 DIAGNOSIS — J449 Chronic obstructive pulmonary disease, unspecified: Secondary | ICD-10-CM | POA: Diagnosis not present

## 2022-04-15 DIAGNOSIS — E46 Unspecified protein-calorie malnutrition: Secondary | ICD-10-CM | POA: Diagnosis not present

## 2022-05-13 DIAGNOSIS — Z23 Encounter for immunization: Secondary | ICD-10-CM | POA: Diagnosis not present

## 2022-05-13 DIAGNOSIS — J449 Chronic obstructive pulmonary disease, unspecified: Secondary | ICD-10-CM | POA: Diagnosis not present

## 2022-05-25 DIAGNOSIS — I5022 Chronic systolic (congestive) heart failure: Secondary | ICD-10-CM | POA: Diagnosis not present

## 2022-05-25 DIAGNOSIS — I10 Essential (primary) hypertension: Secondary | ICD-10-CM | POA: Diagnosis not present

## 2022-05-25 DIAGNOSIS — N184 Chronic kidney disease, stage 4 (severe): Secondary | ICD-10-CM | POA: Diagnosis not present

## 2022-05-25 DIAGNOSIS — K219 Gastro-esophageal reflux disease without esophagitis: Secondary | ICD-10-CM | POA: Diagnosis not present

## 2022-05-25 DIAGNOSIS — E785 Hyperlipidemia, unspecified: Secondary | ICD-10-CM | POA: Diagnosis not present

## 2022-05-25 DIAGNOSIS — J449 Chronic obstructive pulmonary disease, unspecified: Secondary | ICD-10-CM | POA: Diagnosis not present

## 2022-06-03 DIAGNOSIS — J449 Chronic obstructive pulmonary disease, unspecified: Secondary | ICD-10-CM | POA: Diagnosis not present

## 2022-06-10 DIAGNOSIS — K219 Gastro-esophageal reflux disease without esophagitis: Secondary | ICD-10-CM | POA: Diagnosis not present

## 2022-06-10 DIAGNOSIS — I272 Pulmonary hypertension, unspecified: Secondary | ICD-10-CM | POA: Diagnosis not present

## 2022-06-10 DIAGNOSIS — I5022 Chronic systolic (congestive) heart failure: Secondary | ICD-10-CM | POA: Diagnosis not present

## 2022-06-10 DIAGNOSIS — I10 Essential (primary) hypertension: Secondary | ICD-10-CM | POA: Diagnosis not present

## 2022-06-10 DIAGNOSIS — J449 Chronic obstructive pulmonary disease, unspecified: Secondary | ICD-10-CM | POA: Diagnosis not present

## 2022-06-10 DIAGNOSIS — N184 Chronic kidney disease, stage 4 (severe): Secondary | ICD-10-CM | POA: Diagnosis not present

## 2022-06-10 DIAGNOSIS — E785 Hyperlipidemia, unspecified: Secondary | ICD-10-CM | POA: Diagnosis not present

## 2022-06-22 DIAGNOSIS — I469 Cardiac arrest, cause unspecified: Secondary | ICD-10-CM | POA: Diagnosis not present

## 2022-06-22 DIAGNOSIS — Z743 Need for continuous supervision: Secondary | ICD-10-CM | POA: Diagnosis not present

## 2022-06-29 ENCOUNTER — Telehealth: Payer: Self-pay | Admitting: Student

## 2022-06-29 NOTE — Telephone Encounter (Signed)
Received a call from [place] at [facility].  Patient passed away at home and the family told them  was the patient's physician.  I let Dorothea Ogle know that we have not seen the patient since March 2023.  I provided him with the name Dr. [name] at [facility].  Previous notes show this is the patient's PCP.   I did not get the date of death.

## 2022-07-05 DEATH — deceased

## 2022-11-12 ENCOUNTER — Other Ambulatory Visit: Payer: Medicare Other
